# Patient Record
Sex: Female | Born: 1965
Health system: Southern US, Community
[De-identification: ages and names within clinical notes are randomized; demographics above are authoritative.]

## PROBLEM LIST (undated history)

## (undated) ENCOUNTER — Emergency Department (HOSPITAL_BASED_OUTPATIENT_CLINIC_OR_DEPARTMENT_OTHER): Admission: EM | Payer: Medicare Other | Source: Home / Self Care

## (undated) DIAGNOSIS — G629 Polyneuropathy, unspecified: Secondary | ICD-10-CM

## (undated) DIAGNOSIS — M545 Low back pain, unspecified: Secondary | ICD-10-CM

## (undated) DIAGNOSIS — F329 Major depressive disorder, single episode, unspecified: Secondary | ICD-10-CM

## (undated) DIAGNOSIS — H9190 Unspecified hearing loss, unspecified ear: Secondary | ICD-10-CM

## (undated) DIAGNOSIS — N159 Renal tubulo-interstitial disease, unspecified: Secondary | ICD-10-CM

## (undated) DIAGNOSIS — R519 Headache, unspecified: Secondary | ICD-10-CM

## (undated) DIAGNOSIS — R51 Headache: Principal | ICD-10-CM

## (undated) DIAGNOSIS — C50919 Malignant neoplasm of unspecified site of unspecified female breast: Secondary | ICD-10-CM

## (undated) DIAGNOSIS — N390 Urinary tract infection, site not specified: Secondary | ICD-10-CM

## (undated) DIAGNOSIS — T4145XA Adverse effect of unspecified anesthetic, initial encounter: Secondary | ICD-10-CM

## (undated) DIAGNOSIS — G459 Transient cerebral ischemic attack, unspecified: Secondary | ICD-10-CM

## (undated) DIAGNOSIS — M1990 Unspecified osteoarthritis, unspecified site: Secondary | ICD-10-CM

## (undated) DIAGNOSIS — H9311 Tinnitus, right ear: Secondary | ICD-10-CM

## (undated) DIAGNOSIS — G4730 Sleep apnea, unspecified: Secondary | ICD-10-CM

## (undated) DIAGNOSIS — F419 Anxiety disorder, unspecified: Secondary | ICD-10-CM

## (undated) DIAGNOSIS — A419 Sepsis, unspecified organism: Secondary | ICD-10-CM

## (undated) DIAGNOSIS — C801 Malignant (primary) neoplasm, unspecified: Secondary | ICD-10-CM

## (undated) DIAGNOSIS — E785 Hyperlipidemia, unspecified: Secondary | ICD-10-CM

## (undated) DIAGNOSIS — F32A Depression, unspecified: Secondary | ICD-10-CM

## (undated) HISTORY — DX: Malignant (primary) neoplasm, unspecified: C80.1

## (undated) HISTORY — DX: Hyperlipidemia, unspecified: E78.5

## (undated) HISTORY — DX: Sepsis, unspecified organism: A41.9

## (undated) HISTORY — PX: BREAST LUMPECTOMY: SHX2

## (undated) HISTORY — DX: Low back pain, unspecified: M54.50

## (undated) HISTORY — DX: Low back pain: M54.5

## (undated) HISTORY — DX: Tinnitus, right ear: H93.11

## (undated) HISTORY — DX: Depression, unspecified: F32.A

## (undated) HISTORY — DX: Transient cerebral ischemic attack, unspecified: G45.9

## (undated) HISTORY — DX: Unspecified hearing loss, unspecified ear: H91.90

## (undated) HISTORY — DX: Headache: R51

## (undated) HISTORY — DX: Headache, unspecified: R51.9

## (undated) HISTORY — DX: Polyneuropathy, unspecified: G62.9

## (undated) HISTORY — DX: Anxiety disorder, unspecified: F41.9

## (undated) HISTORY — DX: Urinary tract infection, site not specified: N39.0

## (undated) HISTORY — DX: Major depressive disorder, single episode, unspecified: F32.9

---

## 1997-08-19 ENCOUNTER — Other Ambulatory Visit: Admission: RE | Admit: 1997-08-19 | Discharge: 1997-08-19 | Payer: Self-pay | Admitting: Podiatry

## 1998-01-22 HISTORY — PX: FOOT SURGERY: SHX648

## 1998-04-12 ENCOUNTER — Other Ambulatory Visit: Admission: RE | Admit: 1998-04-12 | Discharge: 1998-04-12 | Payer: Self-pay | Admitting: Obstetrics and Gynecology

## 1999-05-21 ENCOUNTER — Emergency Department (HOSPITAL_COMMUNITY): Admission: EM | Admit: 1999-05-21 | Discharge: 1999-05-21 | Payer: Self-pay | Admitting: Emergency Medicine

## 1999-05-22 ENCOUNTER — Encounter: Admission: RE | Admit: 1999-05-22 | Discharge: 1999-05-22 | Payer: Self-pay | Admitting: *Deleted

## 1999-10-27 ENCOUNTER — Other Ambulatory Visit: Admission: RE | Admit: 1999-10-27 | Discharge: 1999-10-27 | Payer: Self-pay | Admitting: Obstetrics and Gynecology

## 2000-01-31 ENCOUNTER — Inpatient Hospital Stay (HOSPITAL_COMMUNITY): Admission: EM | Admit: 2000-01-31 | Discharge: 2000-02-02 | Payer: Self-pay | Admitting: *Deleted

## 2000-02-04 ENCOUNTER — Inpatient Hospital Stay: Admission: EM | Admit: 2000-02-04 | Discharge: 2000-02-08 | Payer: Self-pay | Admitting: *Deleted

## 2000-10-30 ENCOUNTER — Other Ambulatory Visit: Admission: RE | Admit: 2000-10-30 | Discharge: 2000-10-30 | Payer: Self-pay | Admitting: Obstetrics and Gynecology

## 2000-11-04 ENCOUNTER — Emergency Department (HOSPITAL_COMMUNITY): Admission: EM | Admit: 2000-11-04 | Discharge: 2000-11-04 | Payer: Self-pay

## 2002-10-08 ENCOUNTER — Emergency Department (HOSPITAL_COMMUNITY): Admission: EM | Admit: 2002-10-08 | Discharge: 2002-10-08 | Payer: Self-pay | Admitting: Emergency Medicine

## 2002-10-08 ENCOUNTER — Encounter: Payer: Self-pay | Admitting: Emergency Medicine

## 2002-11-04 ENCOUNTER — Encounter: Admission: RE | Admit: 2002-11-04 | Discharge: 2002-11-17 | Payer: Self-pay | Admitting: Neurosurgery

## 2003-01-06 ENCOUNTER — Other Ambulatory Visit: Admission: RE | Admit: 2003-01-06 | Discharge: 2003-01-06 | Payer: Self-pay | Admitting: Obstetrics and Gynecology

## 2003-01-20 ENCOUNTER — Encounter: Admission: RE | Admit: 2003-01-20 | Discharge: 2003-01-20 | Payer: Self-pay | Admitting: Obstetrics and Gynecology

## 2003-11-24 ENCOUNTER — Ambulatory Visit: Payer: Self-pay | Admitting: Family Medicine

## 2003-11-30 ENCOUNTER — Encounter: Admission: RE | Admit: 2003-11-30 | Discharge: 2003-12-10 | Payer: Self-pay | Admitting: Neurosurgery

## 2003-12-07 ENCOUNTER — Encounter: Admission: RE | Admit: 2003-12-07 | Discharge: 2003-12-07 | Payer: Self-pay | Admitting: Obstetrics & Gynecology

## 2004-03-08 ENCOUNTER — Ambulatory Visit: Payer: Self-pay | Admitting: Family Medicine

## 2004-03-15 ENCOUNTER — Ambulatory Visit: Payer: Self-pay | Admitting: Family Medicine

## 2004-03-29 ENCOUNTER — Other Ambulatory Visit: Admission: RE | Admit: 2004-03-29 | Discharge: 2004-03-29 | Payer: Self-pay | Admitting: Obstetrics and Gynecology

## 2004-04-27 ENCOUNTER — Ambulatory Visit: Payer: Self-pay | Admitting: Internal Medicine

## 2004-08-22 HISTORY — PX: ABDOMINAL HYSTERECTOMY: SHX81

## 2004-09-18 ENCOUNTER — Observation Stay (HOSPITAL_COMMUNITY): Admission: RE | Admit: 2004-09-18 | Discharge: 2004-09-19 | Payer: Self-pay | Admitting: Obstetrics and Gynecology

## 2004-09-18 ENCOUNTER — Encounter (INDEPENDENT_AMBULATORY_CARE_PROVIDER_SITE_OTHER): Payer: Self-pay | Admitting: *Deleted

## 2005-02-02 ENCOUNTER — Ambulatory Visit: Payer: Self-pay | Admitting: Family Medicine

## 2005-02-06 ENCOUNTER — Ambulatory Visit: Payer: Self-pay | Admitting: Family Medicine

## 2005-05-15 ENCOUNTER — Ambulatory Visit: Payer: Self-pay | Admitting: Family Medicine

## 2005-08-08 ENCOUNTER — Ambulatory Visit: Payer: Self-pay | Admitting: Family Medicine

## 2005-12-10 ENCOUNTER — Inpatient Hospital Stay (HOSPITAL_COMMUNITY): Admission: EM | Admit: 2005-12-10 | Discharge: 2005-12-11 | Payer: Self-pay | Admitting: Emergency Medicine

## 2005-12-10 ENCOUNTER — Ambulatory Visit: Payer: Self-pay | Admitting: Internal Medicine

## 2005-12-17 ENCOUNTER — Ambulatory Visit: Payer: Self-pay | Admitting: Family Medicine

## 2006-01-07 ENCOUNTER — Encounter: Admission: RE | Admit: 2006-01-07 | Discharge: 2006-04-07 | Payer: Self-pay | Admitting: Internal Medicine

## 2006-03-07 ENCOUNTER — Ambulatory Visit: Payer: Self-pay | Admitting: Family Medicine

## 2006-10-01 ENCOUNTER — Encounter: Admission: RE | Admit: 2006-10-01 | Discharge: 2006-10-01 | Payer: Self-pay | Admitting: Obstetrics and Gynecology

## 2006-11-05 ENCOUNTER — Telehealth (INDEPENDENT_AMBULATORY_CARE_PROVIDER_SITE_OTHER): Payer: Self-pay | Admitting: *Deleted

## 2006-11-26 ENCOUNTER — Ambulatory Visit: Payer: Self-pay | Admitting: Family Medicine

## 2006-11-26 DIAGNOSIS — F329 Major depressive disorder, single episode, unspecified: Secondary | ICD-10-CM

## 2006-11-26 DIAGNOSIS — J209 Acute bronchitis, unspecified: Secondary | ICD-10-CM

## 2006-11-26 DIAGNOSIS — E1169 Type 2 diabetes mellitus with other specified complication: Secondary | ICD-10-CM | POA: Insufficient documentation

## 2006-11-26 DIAGNOSIS — E119 Type 2 diabetes mellitus without complications: Secondary | ICD-10-CM

## 2006-11-26 DIAGNOSIS — E785 Hyperlipidemia, unspecified: Secondary | ICD-10-CM

## 2006-11-26 DIAGNOSIS — F411 Generalized anxiety disorder: Secondary | ICD-10-CM | POA: Insufficient documentation

## 2006-11-26 DIAGNOSIS — F32A Depression, unspecified: Secondary | ICD-10-CM | POA: Insufficient documentation

## 2006-11-27 ENCOUNTER — Ambulatory Visit: Payer: Self-pay | Admitting: Family Medicine

## 2006-11-27 LAB — CONVERTED CEMR LAB
Bilirubin Urine: NEGATIVE
Blood in Urine, dipstick: NEGATIVE
pH: 5

## 2006-11-28 ENCOUNTER — Telehealth: Payer: Self-pay | Admitting: Family Medicine

## 2006-11-28 LAB — CONVERTED CEMR LAB
AST: 39 units/L — ABNORMAL HIGH (ref 0–37)
Albumin: 4 g/dL (ref 3.5–5.2)
Alkaline Phosphatase: 101 units/L (ref 39–117)
BUN: 10 mg/dL (ref 6–23)
Basophils Absolute: 0 10*3/uL (ref 0.0–0.1)
Chloride: 104 meq/L (ref 96–112)
Cholesterol: 249 mg/dL (ref 0–200)
Creatinine, Ser: 0.8 mg/dL (ref 0.4–1.2)
Direct LDL: 189.7 mg/dL
GFR calc non Af Amer: 84 mL/min
HDL: 33.3 mg/dL — ABNORMAL LOW (ref >39.0)
Hgb A1c MFr Bld: 10.9 % — ABNORMAL HIGH (ref 4.6–6.0)
MCHC: 34.2 g/dL (ref 30.0–36.0)
Microalb, Ur: 5.6 mg/dL — ABNORMAL HIGH (ref 0.0–1.9)
Monocytes Relative: 6.9 % (ref 3.0–11.0)
Platelets: 234 10*3/uL (ref 150–400)
RBC: 5.26 M/uL — ABNORMAL HIGH (ref 3.87–5.11)
RDW: 14.2 % (ref 11.5–14.6)
TSH: 4.05 microintl units/mL (ref 0.35–5.50)
Total Bilirubin: 0.8 mg/dL (ref 0.3–1.2)
Total CHOL/HDL Ratio: 7.5
Triglycerides: 200 mg/dL — ABNORMAL HIGH (ref 0–149)

## 2006-12-04 ENCOUNTER — Ambulatory Visit: Payer: Self-pay | Admitting: Family Medicine

## 2006-12-08 ENCOUNTER — Observation Stay (HOSPITAL_COMMUNITY): Admission: EM | Admit: 2006-12-08 | Discharge: 2006-12-10 | Payer: Self-pay | Admitting: Emergency Medicine

## 2006-12-08 ENCOUNTER — Encounter (INDEPENDENT_AMBULATORY_CARE_PROVIDER_SITE_OTHER): Payer: Self-pay | Admitting: Neurology

## 2006-12-09 ENCOUNTER — Telehealth: Payer: Self-pay | Admitting: Family Medicine

## 2006-12-09 ENCOUNTER — Encounter (INDEPENDENT_AMBULATORY_CARE_PROVIDER_SITE_OTHER): Payer: Self-pay | Admitting: Neurology

## 2006-12-10 ENCOUNTER — Encounter: Payer: Self-pay | Admitting: Family Medicine

## 2006-12-11 ENCOUNTER — Encounter: Payer: Self-pay | Admitting: Family Medicine

## 2007-04-05 ENCOUNTER — Encounter: Payer: Self-pay | Admitting: Family Medicine

## 2007-04-14 ENCOUNTER — Ambulatory Visit: Payer: Self-pay | Admitting: Family Medicine

## 2007-05-06 ENCOUNTER — Encounter: Payer: Self-pay | Admitting: Family Medicine

## 2007-05-19 ENCOUNTER — Telehealth: Payer: Self-pay | Admitting: Family Medicine

## 2007-05-22 ENCOUNTER — Ambulatory Visit: Payer: Self-pay | Admitting: Family Medicine

## 2007-05-28 ENCOUNTER — Emergency Department (HOSPITAL_COMMUNITY): Admission: EM | Admit: 2007-05-28 | Discharge: 2007-05-28 | Payer: Self-pay | Admitting: Emergency Medicine

## 2007-05-29 ENCOUNTER — Telehealth: Payer: Self-pay | Admitting: Family Medicine

## 2007-06-03 ENCOUNTER — Ambulatory Visit: Payer: Self-pay | Admitting: Family Medicine

## 2007-06-03 DIAGNOSIS — S335XXA Sprain of ligaments of lumbar spine, initial encounter: Secondary | ICD-10-CM | POA: Insufficient documentation

## 2007-06-09 ENCOUNTER — Telehealth: Payer: Self-pay | Admitting: Family Medicine

## 2007-07-04 ENCOUNTER — Encounter: Payer: Self-pay | Admitting: Family Medicine

## 2007-08-28 ENCOUNTER — Ambulatory Visit: Payer: Self-pay | Admitting: Family Medicine

## 2007-08-28 DIAGNOSIS — B379 Candidiasis, unspecified: Secondary | ICD-10-CM

## 2007-11-12 ENCOUNTER — Encounter: Admission: RE | Admit: 2007-11-12 | Discharge: 2007-11-12 | Payer: Self-pay | Admitting: Obstetrics and Gynecology

## 2007-11-23 HISTORY — PX: BREAST SURGERY: SHX581

## 2007-11-28 ENCOUNTER — Encounter: Admission: RE | Admit: 2007-11-28 | Discharge: 2007-11-28 | Payer: Self-pay | Admitting: Obstetrics and Gynecology

## 2007-12-15 ENCOUNTER — Encounter (INDEPENDENT_AMBULATORY_CARE_PROVIDER_SITE_OTHER): Payer: Self-pay | Admitting: General Surgery

## 2007-12-15 ENCOUNTER — Ambulatory Visit (HOSPITAL_COMMUNITY): Admission: RE | Admit: 2007-12-15 | Discharge: 2007-12-15 | Payer: Self-pay | Admitting: General Surgery

## 2007-12-22 ENCOUNTER — Ambulatory Visit: Payer: Self-pay | Admitting: Oncology

## 2007-12-28 ENCOUNTER — Encounter: Admission: RE | Admit: 2007-12-28 | Discharge: 2007-12-28 | Payer: Self-pay | Admitting: General Surgery

## 2007-12-31 ENCOUNTER — Ambulatory Visit: Admission: RE | Admit: 2007-12-31 | Discharge: 2008-01-20 | Payer: Self-pay | Admitting: Radiation Oncology

## 2007-12-31 ENCOUNTER — Encounter: Payer: Self-pay | Admitting: Family Medicine

## 2008-01-12 ENCOUNTER — Ambulatory Visit: Payer: Self-pay | Admitting: Genetic Counselor

## 2008-01-22 ENCOUNTER — Encounter: Payer: Self-pay | Admitting: Family Medicine

## 2008-02-11 ENCOUNTER — Encounter: Payer: Self-pay | Admitting: Family Medicine

## 2008-02-20 ENCOUNTER — Ambulatory Visit: Payer: Self-pay | Admitting: Family Medicine

## 2008-02-20 DIAGNOSIS — J45909 Unspecified asthma, uncomplicated: Secondary | ICD-10-CM | POA: Insufficient documentation

## 2008-04-12 ENCOUNTER — Ambulatory Visit: Payer: Self-pay | Admitting: Oncology

## 2008-04-14 ENCOUNTER — Encounter: Payer: Self-pay | Admitting: Family Medicine

## 2008-05-31 ENCOUNTER — Telehealth: Payer: Self-pay | Admitting: Family Medicine

## 2008-06-01 HISTORY — PX: NERVE GRAFT: SHX721

## 2008-08-16 ENCOUNTER — Ambulatory Visit: Payer: Self-pay | Admitting: Family Medicine

## 2008-08-16 DIAGNOSIS — N390 Urinary tract infection, site not specified: Secondary | ICD-10-CM

## 2008-08-16 LAB — CONVERTED CEMR LAB
Bilirubin Urine: NEGATIVE
Ketones, urine, test strip: NEGATIVE
Nitrite: POSITIVE
Specific Gravity, Urine: >=1.03

## 2008-10-12 ENCOUNTER — Emergency Department (HOSPITAL_COMMUNITY): Admission: EM | Admit: 2008-10-12 | Discharge: 2008-10-12 | Payer: Self-pay | Admitting: Emergency Medicine

## 2008-11-29 ENCOUNTER — Encounter: Admission: RE | Admit: 2008-11-29 | Discharge: 2008-11-29 | Payer: Self-pay | Admitting: General Surgery

## 2009-01-03 ENCOUNTER — Encounter: Admission: RE | Admit: 2009-01-03 | Discharge: 2009-01-03 | Payer: Self-pay | Admitting: General Surgery

## 2009-01-28 ENCOUNTER — Ambulatory Visit: Payer: Self-pay | Admitting: Oncology

## 2009-02-01 ENCOUNTER — Encounter: Payer: Self-pay | Admitting: Family Medicine

## 2009-04-14 ENCOUNTER — Ambulatory Visit: Payer: Self-pay | Admitting: Family Medicine

## 2009-04-15 LAB — CONVERTED CEMR LAB
ALT: 25 units/L (ref 0–35)
AST: 28 units/L (ref 0–37)
Albumin: 3.9 g/dL (ref 3.5–5.2)
Alkaline Phosphatase: 113 units/L (ref 39–117)
BUN: 13 mg/dL (ref 6–23)
Basophils Absolute: 0 10*3/uL (ref 0.0–0.1)
Basophils Relative: 0.3 % (ref 0.0–3.0)
Bilirubin, Direct: 0.1 mg/dL (ref 0.0–0.3)
CO2: 29 meq/L (ref 19–32)
Calcium: 9.5 mg/dL (ref 8.4–10.5)
Chloride: 100 meq/L (ref 96–112)
Creatinine, Ser: 1 mg/dL (ref 0.4–1.2)
Creatinine,U: 99 mg/dL
Eosinophils Absolute: 0.2 10*3/uL (ref 0.0–0.7)
Eosinophils Relative: 2.3 % (ref 0.0–5.0)
GFR calc non Af Amer: 63.91 mL/min (ref >60)
Glucose, Bld: 200 mg/dL — ABNORMAL HIGH (ref 70–99)
HCT: 40.6 % (ref 36.0–46.0)
Hemoglobin: 13.3 g/dL (ref 12.0–15.0)
Hgb A1c MFr Bld: 8.8 % — ABNORMAL HIGH (ref 4.6–6.5)
Lymphocytes Relative: 26.1 % (ref 12.0–46.0)
Lymphs Abs: 1.8 10*3/uL (ref 0.7–4.0)
MCHC: 32.8 g/dL (ref 30.0–36.0)
MCV: 81.2 fL (ref 78.0–100.0)
Microalb Creat Ratio: 15.2 mg/g (ref 0.0–30.0)
Microalb, Ur: 1.5 mg/dL (ref 0.0–1.9)
Monocytes Absolute: 0.3 10*3/uL (ref 0.1–1.0)
Monocytes Relative: 4.8 % (ref 3.0–12.0)
Neutro Abs: 4.7 10*3/uL (ref 1.4–7.7)
Neutrophils Relative %: 66.5 % (ref 43.0–77.0)
Platelets: 195 10*3/uL (ref 150.0–400.0)
Potassium: 4.2 meq/L (ref 3.5–5.1)
Pro B Natriuretic peptide (BNP): 28 pg/mL (ref 0.0–100.0)
RBC: 5 M/uL (ref 3.87–5.11)
RDW: 15.2 % — ABNORMAL HIGH (ref 11.5–14.6)
Sodium: 141 meq/L (ref 135–145)
TSH: 4.04 microintl units/mL (ref 0.35–5.50)
Total Bilirubin: 0.5 mg/dL (ref 0.3–1.2)
Total Protein: 7.6 g/dL (ref 6.0–8.3)
WBC: 7 10*3/uL (ref 4.5–10.5)

## 2009-05-16 ENCOUNTER — Telehealth: Payer: Self-pay | Admitting: Family Medicine

## 2009-08-22 DIAGNOSIS — N390 Urinary tract infection, site not specified: Secondary | ICD-10-CM

## 2009-08-22 DIAGNOSIS — A419 Sepsis, unspecified organism: Secondary | ICD-10-CM

## 2009-08-22 HISTORY — DX: Sepsis, unspecified organism: A41.9

## 2009-08-22 HISTORY — DX: Urinary tract infection, site not specified: N39.0

## 2009-09-07 ENCOUNTER — Emergency Department (HOSPITAL_COMMUNITY): Admission: EM | Admit: 2009-09-07 | Discharge: 2009-09-08 | Payer: Self-pay | Admitting: Emergency Medicine

## 2009-09-09 ENCOUNTER — Inpatient Hospital Stay (HOSPITAL_COMMUNITY): Admission: EM | Admit: 2009-09-09 | Discharge: 2009-09-12 | Payer: Self-pay | Admitting: Emergency Medicine

## 2009-09-09 ENCOUNTER — Ambulatory Visit: Payer: Self-pay | Admitting: Family Medicine

## 2009-09-09 DIAGNOSIS — N12 Tubulo-interstitial nephritis, not specified as acute or chronic: Secondary | ICD-10-CM

## 2009-09-16 ENCOUNTER — Ambulatory Visit: Payer: Self-pay | Admitting: Family Medicine

## 2009-09-16 LAB — CONVERTED CEMR LAB
Blood in Urine, dipstick: NEGATIVE
Glucose, Urine, Semiquant: >=1000
Ketones, urine, test strip: NEGATIVE
WBC Urine, dipstick: NEGATIVE
pH: 5

## 2009-09-20 ENCOUNTER — Telehealth: Payer: Self-pay | Admitting: Family Medicine

## 2009-09-21 ENCOUNTER — Telehealth: Payer: Self-pay | Admitting: Family Medicine

## 2009-09-28 ENCOUNTER — Ambulatory Visit: Payer: Self-pay | Admitting: Family Medicine

## 2009-09-28 LAB — CONVERTED CEMR LAB
Blood in Urine, dipstick: NEGATIVE
Glucose, Urine, Semiquant: 250
Ketones, urine, test strip: NEGATIVE
Urobilinogen, UA: 0.2
WBC Urine, dipstick: NEGATIVE

## 2009-09-29 ENCOUNTER — Encounter: Payer: Self-pay | Admitting: Family Medicine

## 2009-10-02 ENCOUNTER — Observation Stay (HOSPITAL_COMMUNITY): Admission: EM | Admit: 2009-10-02 | Discharge: 2009-10-03 | Payer: Self-pay | Admitting: Emergency Medicine

## 2009-10-03 ENCOUNTER — Encounter (INDEPENDENT_AMBULATORY_CARE_PROVIDER_SITE_OTHER): Payer: Self-pay | Admitting: Internal Medicine

## 2009-10-13 ENCOUNTER — Ambulatory Visit: Payer: Self-pay | Admitting: Family Medicine

## 2009-10-13 DIAGNOSIS — M545 Low back pain, unspecified: Secondary | ICD-10-CM | POA: Insufficient documentation

## 2009-10-13 DIAGNOSIS — G459 Transient cerebral ischemic attack, unspecified: Secondary | ICD-10-CM

## 2009-10-21 ENCOUNTER — Ambulatory Visit: Payer: Self-pay | Admitting: Family Medicine

## 2009-10-21 ENCOUNTER — Encounter: Admission: RE | Admit: 2009-10-21 | Discharge: 2009-10-21 | Payer: Self-pay | Admitting: Family Medicine

## 2009-10-27 ENCOUNTER — Telehealth: Payer: Self-pay | Admitting: Family Medicine

## 2009-10-27 ENCOUNTER — Emergency Department (HOSPITAL_COMMUNITY): Admission: EM | Admit: 2009-10-27 | Discharge: 2009-10-27 | Payer: Self-pay | Admitting: Emergency Medicine

## 2009-11-04 ENCOUNTER — Telehealth: Payer: Self-pay | Admitting: Family Medicine

## 2009-11-04 ENCOUNTER — Emergency Department (HOSPITAL_COMMUNITY)
Admission: EM | Admit: 2009-11-04 | Discharge: 2009-11-04 | Disposition: A | Discharge status: Other Acute Inpatient Hospital | Payer: Self-pay | Source: Home / Self Care | Admitting: Family Medicine

## 2009-11-05 ENCOUNTER — Inpatient Hospital Stay (HOSPITAL_COMMUNITY): Admission: EM | Admit: 2009-11-05 | Discharge: 2009-11-07 | Payer: Self-pay | Admitting: Emergency Medicine

## 2010-01-04 ENCOUNTER — Encounter: Payer: Self-pay | Admitting: Family Medicine

## 2010-01-05 ENCOUNTER — Encounter
Admission: RE | Admit: 2010-01-05 | Discharge: 2010-01-05 | Payer: Self-pay | Source: Home / Self Care | Attending: Obstetrics and Gynecology | Admitting: Obstetrics and Gynecology

## 2010-01-27 ENCOUNTER — Ambulatory Visit: Payer: Self-pay | Admitting: Oncology

## 2010-02-12 ENCOUNTER — Encounter: Payer: Self-pay | Admitting: Gastroenterology

## 2010-02-23 NOTE — Assessment & Plan Note (Signed)
Summary: low back pain/dm   Vital Signs:  Patient profile:   45 year old female O2 Sat:      95 % Temp:     98.1 degrees F Pulse rate:   90 / minute BP sitting:   130 / 76  (left arm) Cuff size:   large  Vitals Entered By: Pura Spice, RN (October 13, 2009 10:52 AM) CC: left thigh numbness low back pain   History of Present Illness: Here to follow up a recent hospital stay from 10-02-09 to 10-03-09 and for severe low back pains. When she was recently admitted she was experiencing numbness and tinglin in the right face, right arm, and right leg. No weakness or pain. No hx of trauma. Her head CT was clear. She was felt to have had a TIA, and this would be the third one she has had in the past 4 years. Since coming home she still has slight tingling in the right face, but the right arm and leg are back to normal. She was set to have an outpatient brain MRI but this has been postponed several times due to insurance reasons. Also, she has had steadily worsening sharp severe pains in the left lower back that radiate down the back of the left leg. No numbness but the leg is weak, and she feels like she might fall when walking on it. Using Motrin.   Allergies (verified): No Known Drug Allergies  Past History:  Past Medical History: Anxiety Depression Diabetes mellitus, type II (sees Dr. Talmage Nap) Hyperlipidemia peripheral neuropathy TIA's 2007 and 2008 and 2011  Asthma breast cancer, sees Dr. Susanne Greenhouse urosepsis with Adline Mango 08-2009   Past Surgical History: Reviewed history from 04/14/2009 and no changes required. Hysterectomy Plantar fasciitis in lt foot surgery cadaver nerve graft to right inferior alveolar nerve 06-01-08 per Dr. Mcarthur Rossetti at [facility] [place] [facility] after this was               damaged during a root canal excisional biopsy of left ductal carcinoma in situ 11-2007  Review of Systems  The patient denies anorexia, fever, weight loss, weight gain, vision loss, decreased  hearing, hoarseness, chest pain, syncope, dyspnea on exertion, peripheral edema, prolonged cough, headaches, hemoptysis, abdominal pain, melena, hematochezia, severe indigestion/heartburn, hematuria, incontinence, genital sores, muscle weakness, suspicious skin lesions, transient blindness, difficulty walking, depression, unusual weight change, abnormal bleeding, enlarged lymph nodes, angioedema, breast masses, and testicular masses.    Physical Exam  General:  alert, in pain. It is hard for her to get up out of a chair due to pain Neck:  No deformities, masses, or tenderness noted. Lungs:  Normal respiratory effort, chest expands symmetrically. Lungs are clear to auscultation, no crackles or wheezes. Heart:  Normal rate and regular rhythm. S1 and S2 normal without gallop, murmur, click, rub or other extra sounds. Msk:  very tender in the left lower back and over the left sciatic notch. Her ROM is reduced and SLR on the left is positive Neurologic:  alert & oriented X3, cranial nerves II-XII intact, and strength normal in all extremities.  She has a limp    Impression & Recommendations:  Problem # 1:  TIA (ICD-435.9)  Her updated medication list for this problem includes:    Aspirin 325 Mg Tabs (Aspirin) .Marland Kitchen... 1 once daily  Problem # 2:  BACK PAIN, LUMBAR (ICD-724.2)  Her updated medication list for this problem includes:    Aspirin 325 Mg Tabs (Aspirin) .Marland KitchenMarland KitchenMarland KitchenMarland Kitchen 1  once daily    Ibuprofen 800 Mg Tabs (Ibuprofen) .Marland Kitchen... 1 every 6 hours as needed pain  Orders: Radiology Referral (Radiology)  Complete Medication List: 1)  Simvastatin 40 Mg Tabs (Simvastatin) .Marland Kitchen.. 1 by mouth once daily 2)  Glucophage 1000 Mg Tabs (Metformin hcl) .Marland Kitchen.. 1 once daily 3)  Sertraline Hcl 50 Mg Tabs (Sertraline hcl) .Marland Kitchen.. 1 by mouth once daily 4)  Aspirin 325 Mg Tabs (Aspirin) .Marland Kitchen.. 1 once daily 5)  Zocor 40 Mg Tabs (Simvastatin) .Marland Kitchen.. 1 by mouth once daily 6)  Proair Hfa 108 (90 Base) Mcg/act Aers (Albuterol  sulfate) .... 2 puffs every 4 hours as needed sob 7)  Ibuprofen 800 Mg Tabs (Ibuprofen) .Marland Kitchen.. 1 every 6 hours as needed pain 8)  Ketoconazole 2 % Crea (Ketoconazole) .... Apply three times a day as needed 9)  Furosemide 40 Mg Tabs (Furosemide) .... 2 tabs each am 10)  Januvia 100 Mg Tabs (Sitagliptin phosphate) .... Not taking at this time 11)  Humalog 100 Unit/ml Soln (Insulin lispro (human)) .... 75 units three times a day 12)  Humulin N 100 Unit/ml Susp (Insulin isophane human) .... 75 units three times a day 13)  Diflucan 150 Mg Tabs (Fluconazole) .Marland Kitchen.. 1 tablet 14)  Bactrim Ds 800-160 Mg Tabs (Sulfamethoxazole-trimethoprim) .... Two times a day  Patient Instructions: 1)  We will try to get approval for a contrasted brain MRI soon for the TIAs, as well as MRI of the lumbar spine. rest , use Motrin as needed . Of note, her creatinine on 10-03-09 was normal at 0.96.

## 2010-02-23 NOTE — Assessment & Plan Note (Signed)
Summary: GENERAL MAILAISE, FATIGUE // RS   Vital Signs:  Patient profile:   45 year old female O2 Sat:      97 % Temp:     98.8 degrees F Pulse rate:   93 / minute BP sitting:   130 / 82  (left arm) Cuff size:   large  Vitals Entered By: Pura Spice, RN (September 28, 2009 11:19 AM) CC: states feels bad today c/o low back and hip pain. discharged from hosp 1 cwk ago. Not taking Januvia . FBS ths am per pt 172 Is Patient Diabetic? Yes Did you bring your meter with you today? No   History of Present Illness: Here to follow up on generalized fatigue. She was hospitalized from 09-09-09 to 09-12-09 for  Klebsiella urosepsis, and finished out a 7 day course of Cipro after that. She felt better for a week or so, but now for the past several days she feels much worse and it reminds her of how she felt before she went into the hospital. She is very weak and feels SOB. No nausea or fever, no cough or chest pains, no abdominal pains.   Allergies (verified): No Known Drug Allergies  Past History:  Past Medical History: Anxiety Depression Diabetes mellitus, type II (sees Dr. Talmage Nap) Hyperlipidemia peripheral neuropathy TIA's 2007 and 2008 Asthma breast cancer, sees Dr. Susanne Greenhouse urosepsis with Adline Mango 08-2009   Past Surgical History: Reviewed history from 04/14/2009 and no changes required. Hysterectomy Plantar fasciitis in lt foot surgery cadaver nerve graft to right inferior alveolar nerve 06-01-08 per Dr. Mcarthur Rossetti at [place] [place] [facility] after this was               damaged during a root canal excisional biopsy of left ductal carcinoma in situ 11-2007  Review of Systems  The patient denies anorexia, fever, weight loss, weight gain, vision loss, decreased hearing, hoarseness, chest pain, syncope, dyspnea on exertion, peripheral edema, prolonged cough, headaches, hemoptysis, abdominal pain, melena, hematochezia, severe indigestion/heartburn, hematuria, incontinence, genital sores,  muscle weakness, suspicious skin lesions, transient blindness, difficulty walking, depression, unusual weight change, abnormal bleeding, enlarged lymph nodes, angioedema, breast masses, and testicular masses.    Physical Exam  General:  she appears ill, good skin color, alert Neck:  No deformities, masses, or tenderness noted. Lungs:  Normal respiratory effort, chest expands symmetrically. Lungs are clear to auscultation, no crackles or wheezes. Heart:  Normal rate and regular rhythm. S1 and S2 normal without gallop, murmur, click, rub or other extra sounds. Abdomen:  Bowel sounds positive,abdomen soft and non-tender without masses, organomegaly or hernias noted.   Impression & Recommendations:  Problem # 1:  PYELONEPHRITIS (ICD-590.80)  Her updated medication list for this problem includes:    Bactrim Ds 800-160 Mg Tabs (Sulfamethoxazole-trimethoprim) .Marland Kitchen..Marland Kitchen Two times a day  Orders: UA Dipstick w/o Micro (manual) (16109) T-Culture, Urine (60454-09811)  Complete Medication List: 1)  Simvastatin 40 Mg Tabs (Simvastatin) .Marland Kitchen.. 1 by mouth once daily 2)  Glucophage 1000 Mg Tabs (Metformin hcl) .Marland Kitchen.. 1 once daily 3)  Sertraline Hcl 50 Mg Tabs (Sertraline hcl) .Marland Kitchen.. 1 by mouth once daily 4)  Aspirin 325 Mg Tabs (Aspirin) .Marland Kitchen.. 1 once daily 5)  Zocor 40 Mg Tabs (Simvastatin) .Marland Kitchen.. 1 by mouth once daily 6)  Proair Hfa 108 (90 Base) Mcg/act Aers (Albuterol sulfate) .... 2 puffs every 4 hours as needed sob 7)  Ibuprofen 800 Mg Tabs (Ibuprofen) .Marland Kitchen.. 1 every 6 hours as needed pain 8)  Ketoconazole 2 % Crea (Ketoconazole) .... Apply three times a day as needed 9)  Furosemide 40 Mg Tabs (Furosemide) .... Two times a day 10)  Januvia 100 Mg Tabs (Sitagliptin phosphate) .... Not taking at this time 11)  Humalog 100 Unit/ml Soln (Insulin lispro (human)) .... 75 units three times a day 12)  Humulin N 100 Unit/ml Susp (Insulin isophane human) .... 75 units three times a day 13)  Diflucan 150 Mg Tabs  (Fluconazole) .Marland Kitchen.. 1 tablet 14)  Bactrim Ds 800-160 Mg Tabs (Sulfamethoxazole-trimethoprim) .... Two times a day  Other Orders: Rocephin  250mg  (Z6109) Admin of Therapeutic Inj  intramuscular or subcutaneous (60454)  Patient Instructions: 1)  I think it is likely that she still has some infection in her system, so we will culture today's urine sample. Given a Rocephin shot. Start on Bactrim DS and plan on taking this for a full 30 days. Drink fluids. We wrote for her to be out of work today until 10-03-09.  Prescriptions: BACTRIM DS 800-160 MG TABS (SULFAMETHOXAZOLE-TRIMETHOPRIM) two times a day  #60 x 0   Entered and Authorized by:   Nelwyn Salisbury MD   Signed by:   Nelwyn Salisbury MD on 09/28/2009   Method used:   Electronically to        [facility]  [address] [phone]* (retail)       [address]       [place], [place]  [id number]       Ph: [phone] or [phone]       Fax: [phone]   RxID:   [id number]   Laboratory Results   Urine Tests  Date/Time Received: September 28, 2009  Date/Time Reported: 12:14 PM   Routine Urinalysis   Color: yellow Appearance: Clear Glucose: 250   (Normal Range: Negative) Bilirubin: negative   (Normal Range: Negative) Ketone: negative   (Normal Range: Negative) Spec. Gravity: 1.020   (Normal Range: 1.003-1.035) Blood: negative   (Normal Range: Negative) pH: 5.0   (Normal Range: 5.0-8.0) Protein: negative   (Normal Range: Negative) Urobilinogen: 0.2   (Normal Range: 0-1) Nitrite: negative   (Normal Range: Negative) Leukocyte Esterace: negative   (Normal Range: Negative)         Medication Administration  Injection # 1:    Medication: Rocephin  250mg     Diagnosis: UROSEPSIS (ICD-599.0)    Route: IM    Site: RUOQ gluteus    Exp Date: 05/2012    Lot #: [id number]    Mfr: [email or web address]    Comments: 1 gram given    Patient tolerated injection without complications    Given by: Pura Spice, RN  (September 28, 2009 12:18 PM)  Orders Added: 1)  Est. Patient Level IV [43329] 2)  UA Dipstick w/o Micro (manual) [81002] 3)  T-Culture, Urine [[id number]] 4)  Rocephin  250mg  [J0696] 5)  Admin of Therapeutic Inj  intramuscular or subcutaneous [30160]

## 2010-02-23 NOTE — Letter (Signed)
Summary: [facility]   Imported By: Maryln Gottron 10/14/2009 14:55:35  _____________________________________________________________________  External Attachment:    Type:   Image     Comment:   External Document

## 2010-02-23 NOTE — Progress Notes (Signed)
Summary: sinus complaints  Phone Note Call from Patient   Caller: Patient Call For: Nelwyn Salisbury MD Summary of Call: Fever 101, sinus congestion, sore throat and chest tightness with dry cough.  Would like and antibioitc as she cannot leave work. [phone] Initial call taken by: Lynann Beaver CMA,  May 16, 2009 9:30 AM  Follow-up for Phone Call        call in a [employer] Follow-up by: Nelwyn Salisbury MD,  May 16, 2009 4:58 PM  Additional Follow-up for Phone Call Additional follow up Details #1::        Rx Called In Additional Follow-up by: Raechel Ache, RN,  May 16, 2009 5:02 PM

## 2010-02-23 NOTE — Progress Notes (Signed)
Summary: ?urosepsis  Phone Note Call from Patient   Caller: Daughter Summary of Call: pt.'s duaghter called, needed to speak with Dr. Clent Ridges right away about an important matter concerning her mother. Say her mother has been in and out of the hospital with Urosepsis and they need to ask him a question. Initial call taken by: Georgian Co,  November 04, 2009 4:39 PM  Follow-up for Phone Call        duplicate call Follow-up by: Nelwyn Salisbury MD,  November 04, 2009 4:52 PM

## 2010-02-23 NOTE — Progress Notes (Signed)
Summary: Advised ER  Phone Note Call from Patient   Caller: Patient Call For: Nelwyn Salisbury MD Summary of Call: Advised pt to go straight to the ER due to extreme SOB, and fatigue with worsening symptoms that she had when she was hospitalized for sepsis. Initial call taken by: Lynann Beaver CMA,  October 27, 2009 3:35 PM  Follow-up for Phone Call        agreed Follow-up by: Nelwyn Salisbury MD,  October 28, 2009 8:47 AM

## 2010-02-23 NOTE — Assessment & Plan Note (Signed)
Summary: fup mri---per dr Brewer Hitchman//ccm   Vital Signs:  Patient profile:   45 year old female O2 Sat:      97 % Temp:     98.9 degrees F Pulse rate:   100 / minute BP sitting:   136 / 92  (left arm) Cuff size:   small  Vitals Entered By: Pura Spice, RN (October 21, 2009 1:02 PM) CC: follow up test results   History of Present Illness: Here to follow up recent MRIs of the head and lumbar spine. The head MRI/MRA was clear. The lumbar scan showed significant disc diease at L4-5. She is still having severe pains which radiate down the left leg.   Allergies (verified): No Known Drug Allergies  Past History:  Past Medical History: Reviewed history from 10/13/2009 and no changes required. Anxiety Depression Diabetes mellitus, type II (sees Dr. Talmage Nap) Hyperlipidemia peripheral neuropathy TIA's 2007 and 2008 and 2011  Asthma breast cancer, sees Dr. Susanne Greenhouse urosepsis with Adline Mango 08-2009   Past Surgical History: Reviewed history from 04/14/2009 and no changes required. Hysterectomy Plantar fasciitis in lt foot surgery cadaver nerve graft to right inferior alveolar nerve 06-01-08 per Dr. Mcarthur Rossetti at [place] [place] [facility] after this was               damaged during a root canal excisional biopsy of left ductal carcinoma in situ 11-2007  Review of Systems  The patient denies anorexia, fever, weight loss, weight gain, vision loss, decreased hearing, hoarseness, chest pain, syncope, dyspnea on exertion, peripheral edema, prolonged cough, headaches, hemoptysis, abdominal pain, melena, hematochezia, severe indigestion/heartburn, hematuria, incontinence, genital sores, muscle weakness, suspicious skin lesions, transient blindness, difficulty walking, depression, unusual weight change, abnormal bleeding, enlarged lymph nodes, angioedema, breast masses, and testicular masses.    Physical Exam  General:  in pain Msk:  No deformity or scoliosis noted of thoracic or lumbar spine.     Neurologic:  No cranial nerve deficits noted. Station and gait are normal. Plantar reflexes are down-going bilaterally. DTRs are symmetrical throughout. Sensory, motor and coordinative functions appear intact.   Impression & Recommendations:  Problem # 1:  BACK PAIN, LUMBAR (ICD-724.2)  Her updated medication list for this problem includes:    Aspirin 325 Mg Tabs (Aspirin) .Marland Kitchen... 1 once daily    Ibuprofen 800 Mg Tabs (Ibuprofen) .Marland Kitchen... 1 every 6 hours as needed pain    Percocet 10-650 Mg Tabs (Oxycodone-acetaminophen) .Marland Kitchen... 1 q 6 hours as needed pain  Orders: Orthopedic Referral (Ortho)  Problem # 2:  TIA (ICD-435.9)  Her updated medication list for this problem includes:    Aspirin 325 Mg Tabs (Aspirin) .Marland Kitchen... 1 once daily  Complete Medication List: 1)  Simvastatin 40 Mg Tabs (Simvastatin) .Marland Kitchen.. 1 by mouth once daily 2)  Glucophage 1000 Mg Tabs (Metformin hcl) .Marland Kitchen.. 1 once daily 3)  Sertraline Hcl 50 Mg Tabs (Sertraline hcl) .Marland Kitchen.. 1 by mouth once daily 4)  Aspirin 325 Mg Tabs (Aspirin) .Marland Kitchen.. 1 once daily 5)  Zocor 40 Mg Tabs (Simvastatin) .Marland Kitchen.. 1 by mouth once daily 6)  Proair Hfa 108 (90 Base) Mcg/act Aers (Albuterol sulfate) .... 2 puffs every 4 hours as needed sob 7)  Ibuprofen 800 Mg Tabs (Ibuprofen) .Marland Kitchen.. 1 every 6 hours as needed pain 8)  Ketoconazole 2 % Crea (Ketoconazole) .... Apply three times a day as needed 9)  Furosemide 40 Mg Tabs (Furosemide) .... 2 tabs each am 10)  Januvia 100 Mg Tabs (Sitagliptin phosphate) .... Not taking  at this time 11)  Humalog 100 Unit/ml Soln (Insulin lispro (human)) .... 75 units three times a day 12)  Humulin N 100 Unit/ml Susp (Insulin isophane human) .... 75 units three times a day 13)  Diflucan 150 Mg Tabs (Fluconazole) .Marland Kitchen.. 1 tablet 14)  Bactrim Ds 800-160 Mg Tabs (Sulfamethoxazole-trimethoprim) .... Two times a day 15)  Percocet 10-650 Mg Tabs (Oxycodone-acetaminophen) .Marland Kitchen.. 1 q 6 hours as needed pain  Patient Instructions: 1)  will refer  to Dr. Sharolyn Douglas for her back  Prescriptions: PERCOCET 10-650 MG TABS (OXYCODONE-ACETAMINOPHEN) 1 q 6 hours as needed pain  #120 x 0   Entered and Authorized by:   Nelwyn Salisbury MD   Signed by:   Nelwyn Salisbury MD on 10/21/2009   Method used:   Print then Give to Patient   RxID:   [id number]

## 2010-02-23 NOTE — Assessment & Plan Note (Signed)
Summary: hosp follow up re: severe blood inf/cjr   Vital Signs:  Patient profile:   45 year old female Temp:     99.0 degrees F oral BP sitting:   112 / 80  (left arm) Cuff size:   regular  Vitals Entered By: Raechel Ache, RN (September 16, 2009 1:07 PM) CC: Hosp f/u, feels weak. BS's 200 at home.   History of Present Illness: Here to follow up a hospital stay from 09-09-09 to 09-12-09 for urosepsis and pyelonephritis from Klebsiella pneumoniae. This was grown from both urine and blood cultures, and it was shown to be sensitive to Ciprofloxacin among other things. She was sent home with a 7 day course of Cirpo which she has been taking. She improved quite a bit with more energy, the fever went away, she regained her appetite, and her abdominal pains resolved. She felt almost back to normal yesterday, but today she actually feels worse again. She is fatigued and has a slight fever again. No abdominal pain, no urinary symptoms, no nausea. Her glucoses have remained out of control since she went home, with readings from 150 to 250 most of the time.   Allergies: No Known Drug Allergies  Past History:  Past Medical History: Anxiety Depression Diabetes mellitus, type II (sees Dr. Talmage Nap) Hyperlipidemia peripheral neuropathy TIA's 2007 and 2008 Asthma breast cancer, sees Dr. Susanne Greenhouse  Past Surgical History: Reviewed history from 04/14/2009 and no changes required. Hysterectomy Plantar fasciitis in lt foot surgery cadaver nerve graft to right inferior alveolar nerve 06-01-08 per Dr. Mcarthur Rossetti at [place]. [facility] [place] [facility] after this was               damaged during a root canal excisional biopsy of left ductal carcinoma in situ 11-2007  Review of Systems  The patient denies anorexia, fever, weight loss, weight gain, vision loss, decreased hearing, hoarseness, chest pain, syncope, dyspnea on exertion, peripheral edema, prolonged cough, headaches, hemoptysis, abdominal pain, melena, hematochezia,  severe indigestion/heartburn, hematuria, incontinence, genital sores, muscle weakness, suspicious skin lesions, transient blindness, difficulty walking, depression, unusual weight change, abnormal bleeding, enlarged lymph nodes, angioedema, breast masses, and testicular masses.    Physical Exam  General:  Well-developed,well-nourished,in no acute distress; alert,appropriate and cooperative throughout examination Neck:  No deformities, masses, or tenderness noted. Lungs:  Normal respiratory effort, chest expands symmetrically. Lungs are clear to auscultation, no crackles or wheezes. Heart:  Normal rate and regular rhythm. S1 and S2 normal without gallop, murmur, click, rub or other extra sounds. Abdomen:  Bowel sounds positive,abdomen soft and non-tender without masses, organomegaly or hernias noted.   Impression & Recommendations:  Problem # 1:  PYELONEPHRITIS (ICD-590.80)  Orders: UA Dipstick w/o Micro (manual) (95621)  Problem # 2:  UROSEPSIS (ICD-599.0)  Problem # 3:  DIABETES MELLITUS, TYPE II (ICD-250.00)  The following medications were removed from the medication list:    Humalog Mix 75/25 Kwikpen 75-25 % Susp (Insulin lispro prot & lispro) .Marland KitchenMarland KitchenMarland KitchenMarland Kitchen 75 three times a day    Humulin R 100 Unit/ml Soln (Insulin regular human) .Marland KitchenMarland KitchenMarland KitchenMarland Kitchen 75u three times a day Her updated medication list for this problem includes:    Glucophage 1000 Mg Tabs (Metformin hcl) .Marland Kitchen... 1 once daily    Aspirin 325 Mg Tabs (Aspirin) .Marland Kitchen... 1 once daily    Januvia 100 Mg Tabs (Sitagliptin phosphate) ..... Once daily    Humalog 100 Unit/ml Soln (Insulin lispro (human)) .Marland KitchenMarland KitchenMarland KitchenMarland Kitchen 75 units three times a day    Humulin N 100  Unit/ml Susp (Insulin isophane human) .Marland KitchenMarland KitchenMarland KitchenMarland Kitchen 75 units three times a day  Complete Medication List: 1)  Simvastatin 40 Mg Tabs (Simvastatin) .Marland Kitchen.. 1 by mouth once daily 2)  Glucophage 1000 Mg Tabs (Metformin hcl) .Marland Kitchen.. 1 once daily 3)  Sertraline Hcl 50 Mg Tabs (Sertraline hcl) .Marland Kitchen.. 1 by mouth once  daily 4)  Aspirin 325 Mg Tabs (Aspirin) .Marland Kitchen.. 1 once daily 5)  Zocor 40 Mg Tabs (Simvastatin) .Marland Kitchen.. 1 by mouth once daily 6)  Proair Hfa 108 (90 Base) Mcg/act Aers (Albuterol sulfate) .... 2 puffs every 4 hours as needed sob 7)  Ibuprofen 800 Mg Tabs (Ibuprofen) .Marland Kitchen.. 1 every 6 hours as needed pain 8)  Ketoconazole 2 % Crea (Ketoconazole) .... Apply three times a day as needed 9)  Furosemide 40 Mg Tabs (Furosemide) .... Two times a day 10)  Januvia 100 Mg Tabs (Sitagliptin phosphate) .... Once daily 11)  Humalog 100 Unit/ml Soln (Insulin lispro (human)) .... 75 units three times a day 12)  Humulin N 100 Unit/ml Susp (Insulin isophane human) .... 75 units three times a day  Patient Instructions: 1)  She seems to be recovering from the sepsis as expected. I told her to expect that she will be fatigued for several more weeks and that  she is getting over a serious infection. Finish out the Cipro. Gave her samples to try Januvia in addition to her other meds to try to get better control over her diabetes. She will follow up with Dr. Talmage Nap ASAP.   Laboratory Results   Urine Tests    Routine Urinalysis   Color: yellow Appearance: Clear Glucose: >=1000   (Normal Range: Negative) Bilirubin: negative   (Normal Range: Negative) Ketone: negative   (Normal Range: Negative) Spec. Gravity: 1.020   (Normal Range: 1.003-1.035) Blood: negative   (Normal Range: Negative) pH: 5.0   (Normal Range: 5.0-8.0) Protein: trace   (Normal Range: Negative) Urobilinogen: 0.2   (Normal Range: 0-1) Nitrite: negative   (Normal Range: Negative) Leukocyte Esterace: negative   (Normal Range: Negative)

## 2010-02-23 NOTE — Progress Notes (Signed)
Summary: diflucan & note  Phone Note Call from Patient Call back at [phone]   Caller: vm 4:36 Summary of Call: Was up with fullblown yeast infection, could hardly sit down, all the antibiotics has got me messed up.  Monistat cream.  Too uncomfortable to dress for work.  1) Diflucan requested.  [facility]   Will go back to work [employer]. as Dr. Carmon Ginsberg suggested.   2) Will need another note to return Mon.   Will pick up note when ready.  Please call me when ready.   Initial call taken by: Rudy Jew, RN,  September 21, 2009 4:48 PM  Follow-up for Phone Call        okay for the work note. call in Diflucan 150 mg #1 with 11 rf Follow-up by: Nelwyn Salisbury MD,  September 21, 2009 4:51 PM    New/Updated Medications: DIFLUCAN 150 MG TABS (FLUCONAZOLE) 1 tablet Prescriptions: DIFLUCAN 150 MG TABS (FLUCONAZOLE) 1 tablet  #1 x 11   Entered by:   Raechel Ache, RN   Authorized by:   Nelwyn Salisbury MD   Signed by:   Raechel Ache, RN on 09/21/2009   Method used:   [facility]  [address] [phone][facility] (retail[facility]       [address]       [place], [place]  [address]       Ph: [phone] or [phone]       Fax: [phone]   RxID:   [id number]

## 2010-02-23 NOTE — Assessment & Plan Note (Signed)
Summary: fu from er/dx kidney inf/njr   Vital Signs:  Patient profile:   45 year old female O2 Sat:      91 % on Room air Temp:     99.7 degrees F oral Pulse rate:   92 / minute Pulse rhythm:   regular BP sitting:   150 / 88  (left arm)  Vitals Entered By: Raechel Ache, RN (September 09, 2009 10:52 AM)  O2 Flow:  Room air CC: F/u ER for fever, chills, nausea, aches and abd pain. Treated with IV abx on Wed night and given Cipro. Still has fever and was called by ER and told to f/u with regard to blood cultures.   History of Present Illness: Cheyenne Guzman is here with her husband to follow up on pyelonephritis, which was diagnosed when she went to the ER on 09-07-09. She had had several days of fever, nausea, fatigue, and back pain then, and in the ER she had a positive UA with a normal WBC count of 7.0. She was given IV fluids and oral Cipro, and then sent home. Since then she has not improved at all symptomatically. She still has fevers, nausea without vomitting, back pain, and fatigue. Drinking water but not much. We searched the computer records and found her urine culture was growing gram negative rods, and a blood culture also has gram negative rods. No speciific identification is available.   Allergies (verified): No Known Drug Allergies  Past History:  Past Medical History: Reviewed history from 04/14/2009 and no changes required. Anxiety Depression Diabetes mellitus, type II (sees Dr. Horald Pollen) Hyperlipidemia peripheral neuropathy TIA's 2007 and 2008 Asthma breast cancer, sees Dr. Susanne Greenhouse  Past Surgical History: Reviewed history from 04/14/2009 and no changes required. Hysterectomy Plantar fasciitis in lt foot surgery cadaver nerve graft to right inferior alveolar nerve 06-01-08 per Dr. Mcarthur Rossetti at [facility] [place]. [facility] [place] [facility] after this was               damaged during a root canal excisional biopsy of left ductal carcinoma in situ 11-2007  Family History: Reviewed history from  12/04/2006 and no changes required. Family History of Arthritis Family History Breast cancer 1st degree relative <50 Family History Diabetes 1st degree relative Family History High cholesterol Family History Hypertension Family History Lung cancer  Social History: Reviewed history from 12/04/2006 and no changes required. Married Former Smoker Alcohol use-no  Review of Systems  The patient denies weight loss, weight gain, vision loss, decreased hearing, hoarseness, chest pain, syncope, peripheral edema, prolonged cough, headaches, hemoptysis, abdominal pain, melena, hematochezia, severe indigestion/heartburn, hematuria, incontinence, genital sores, muscle weakness, suspicious skin lesions, transient blindness, difficulty walking, depression, unusual weight change, abnormal bleeding, enlarged lymph nodes, angioedema, and breast masses.    Physical Exam  General:  alert but ill appearing, tearful Neck:  No deformities, masses, or tenderness noted. Lungs:  Normal respiratory effort, chest expands symmetrically. Lungs are clear to auscultation, no crackles or wheezes. Heart:  Normal rate and regular rhythm. S1 and S2 normal without gallop, murmur, click, rub or other extra sounds. Abdomen:  Bowel sounds positive,abdomen soft and non-tender without masses, organomegaly or hernias noted. Positive CVAT.    Impression & Recommendations:  Problem # 1:  UROSEPSIS (ICD-599.0)  Problem # 2:  PYELONEPHRITIS (ICD-590.80)  Complete Medication List: 1)  Simvastatin 40 Mg Tabs (Simvastatin) .Marland Kitchen.. 1 by mouth once daily 2)  Humalog Mix 75/25 Kwikpen 75-25 % Susp (Insulin lispro prot & lispro) .... 75 three times a  day 3)  Glucophage 1000 Mg Tabs (Metformin hcl) .Marland Kitchen.. 1 once daily 4)  Sertraline Hcl 50 Mg Tabs (Sertraline hcl) .Marland Kitchen.. 1 by mouth once daily 5)  Aspirin 325 Mg Tabs (Aspirin) .Marland Kitchen.. 1 once daily 6)  Zocor 40 Mg Tabs (Simvastatin) .Marland Kitchen.. 1 by mouth once daily 7)  Proair Hfa 108 (90 Base)  Mcg/act Aers (Albuterol sulfate) .... 2 puffs every 4 hours as needed sob 8)  Ibuprofen 800 Mg Tabs (Ibuprofen) .Marland Kitchen.. 1 every 6 hours as needed pain 9)  Ketoconazole 2 % Crea (Ketoconazole) .... Apply three times a day as needed 10)  Humulin R 100 Unit/ml Soln (Insulin regular human) .... 75u three times a day 11)  Furosemide 40 Mg Tabs (Furosemide) .... Two times a day  Patient Instructions: 1)  She needs to be admitted for IV antibiotics and hydration. We will have her husband drive her to the ER now to be evaluated and treated.

## 2010-02-23 NOTE — Progress Notes (Signed)
Summary: Return to work note  Phone Note Call from Patient Call back at [employer] [phone] Call back at Work Phone [phone]   Caller: Patient Call For: Nelwyn Salisbury MD Summary of Call: Needs return to work tomorrow note.  Needs to pick up today.  Initial call taken by: Lynann Beaver CMA,  September 20, 2009 1:45 PM  Follow-up for Phone Call        pt cb checking on status of work note. pt is aware note not ready for pick up Follow-up by: Heron Sabins,  September 20, 2009 3:06 PM  Additional Follow-up for Phone Call Additional follow up Details #1::        please get this for her Additional Follow-up by: Nelwyn Salisbury MD,  September 20, 2009 3:18 PM    Additional Follow-up for Phone Call Additional follow up Details #2::    done Follow-up by: Raechel Ache, RN,  September 20, 2009 3:20 PM

## 2010-02-23 NOTE — Assessment & Plan Note (Signed)
Summary: lower extremity edema /bmw   Vital Signs:  Patient profile:   45 year old female Temp:     98.8 degrees F oral Pulse rate:   92 / minute Pulse rhythm:   regular BP sitting:   132 / 94  (right arm)  Vitals Entered By: Raechel Ache, RN (April 14, 2009 10:44 AM) CC: C/o edema. Is Patient Diabetic? Yes   History of Present Illness: Here for the sudden onset 2 weeks ago of swelling all over her body, including hands and feet. She does feel SOB, but has had no chest pain. Her feet are painful. This all started immeduately after a trip to [place], during which she was on her feet all day for several days and she ate a lot of high sodium foods. She took a few Lasix pills several days ago that she got from her mother, and these were helpful. She had been on Femara per Dr. Darnelle Catalan, but this was stopped last month. Her glucoses at home have been mostly in the 200s for months. She has not seen Dr. Horald Pollen for over a year  Allergies (verified): No Known Drug Allergies  Past History:  Past Medical History: Anxiety Depression Diabetes mellitus, type II (sees Dr. Horald Pollen) Hyperlipidemia peripheral neuropathy TIA's 2007 and 2008 Asthma breast cancer, sees Dr. Susanne Greenhouse  Past Surgical History: Hysterectomy Plantar fasciitis in lt foot surgery cadaver nerve graft to right inferior alveolar nerve 06-01-08 per Dr. Mcarthur Rossetti at [place]. [facility] [place] [facility] after this was               damaged during a root canal excisional biopsy of left ductal carcinoma in situ 11-2007  Review of Systems  The patient denies anorexia, fever, weight loss, vision loss, decreased hearing, hoarseness, chest pain, syncope, prolonged cough, headaches, hemoptysis, abdominal pain, melena, hematochezia, severe indigestion/heartburn, hematuria, incontinence, genital sores, muscle weakness, suspicious skin lesions, transient blindness, difficulty walking, depression, unusual weight change, abnormal bleeding, enlarged lymph  nodes, angioedema, breast masses, and testicular masses.    Physical Exam  General:  morbidly obese Neck:  No deformities, masses, or tenderness noted. Lungs:  Normal respiratory effort, chest expands symmetrically. Lungs are clear to auscultation, no crackles or wheezes. Heart:  Normal rate and regular rhythm. S1 and S2 normal without gallop, murmur, click, rub or other extra sounds. Extremities:  2+ edema of both hands and 4+ edema of both feet and lower legs   Impression & Recommendations:  Problem # 1:  PERIPHERAL EDEMA (ICD-782.3)  Her updated medication list for this problem includes:    Furosemide 40 Mg Tabs (Furosemide) .Marland Kitchen..Marland Kitchen Two times a day  Problem # 2:  DIABETES MELLITUS, TYPE II (ICD-250.00)  The following medications were removed from the medication list:    Lantus 100 Unit/ml Soln (Insulin glargine) .Marland KitchenMarland KitchenMarland KitchenMarland Kitchen 50 units at bedtime Her updated medication list for this problem includes:    Humalog Mix 75/25 Kwikpen 75-25 % Susp (Insulin lispro prot & lispro) .Marland KitchenMarland KitchenMarland KitchenMarland Kitchen 75 three times a day    Glucophage 1000 Mg Tabs (Metformin hcl) .Marland Kitchen... 1 once daily    Aspirin 325 Mg Tabs (Aspirin) .Marland Kitchen... 1 once daily    Humulin R 100 Unit/ml Soln (Insulin regular human) .Marland KitchenMarland KitchenMarland KitchenMarland Kitchen 75u three times a day  Orders: Venipuncture (08657) TLB-BMP (Basic Metabolic Panel-BMET) (80048-METABOL) TLB-CBC Platelet - w/Differential (85025-CBCD) TLB-Hepatic/Liver Function Pnl (80076-HEPATIC) TLB-TSH (Thyroid Stimulating Hormone) (84443-TSH) TLB-BNP (B-Natriuretic Peptide) (83880-BNPR) TLB-A1C / Hgb A1C (Glycohemoglobin) (83036-A1C) TLB-Microalbumin/Creat Ratio, Urine (82043-MALB)  Complete Medication List:  1)  Simvastatin 40 Mg Tabs (Simvastatin) .Marland Kitchen.. 1 by mouth once daily 2)  Humalog Mix 75/25 Kwikpen 75-25 % Susp (Insulin lispro prot & lispro) .... 75 three times a day 3)  Glucophage 1000 Mg Tabs (Metformin hcl) .Marland Kitchen.. 1 once daily 4)  Sertraline Hcl 50 Mg Tabs (Sertraline hcl) .Marland Kitchen.. 1 by mouth once daily 5)   Aspirin 325 Mg Tabs (Aspirin) .Marland Kitchen.. 1 once daily 6)  Zocor 40 Mg Tabs (Simvastatin) .Marland Kitchen.. 1 by mouth once daily 7)  Proair Hfa 108 (90 Base) Mcg/act Aers (Albuterol sulfate) .... 2 puffs every 4 hours as needed sob 8)  Ibuprofen 800 Mg Tabs (Ibuprofen) .Marland Kitchen.. 1 every 6 hours as needed pain 9)  Ketoconazole 2 % Crea (Ketoconazole) .... Apply three times a day as needed 10)  Humulin R 100 Unit/ml Soln (Insulin regular human) .... 75u three times a day 11)  Furosemide 40 Mg Tabs (Furosemide) .... Two times a day  Patient Instructions: 1)  Check labs today. Limit daily sodium intake to 1000 mg. use Lasix two times a day . She needs to see Dr. Horald Pollen ASAP Prescriptions: FUROSEMIDE 40 MG TABS (FUROSEMIDE) two times a day  #60 x 5   Entered and Authorized by:   Nelwyn Salisbury MD   Signed by:   Nelwyn Salisbury MD on 04/14/2009   Method used:   [facility]        [facility]  [address] [phone]* (retail)       [address]       [place], [place]  [id number]       Ph: [phone] or [phone]       Fax: [phone]   RxID:   [id number]

## 2010-02-23 NOTE — Letter (Signed)
[facility]   Imported By: Maryln Gottron 02/17/2009 11:09:17  _____________________________________________________________________  External Attachment:    Type:   Image     Comment:   External Document

## 2010-02-23 NOTE — Letter (Signed)
Summary: [facility]  [facility]   Imported By: Lennie Odor 01/11/2010 11:57:39  _____________________________________________________________________  External Attachment:    Type:   Image     Comment:   External Document

## 2010-02-23 NOTE — Progress Notes (Signed)
Summary: temp ? chills   Phone Note Call from Patient   Caller: Daughter Summary of Call: Florentina Addison daughter in and out hosp with sepsis went to hosp last wk and was told she did not have sepsis and no cultures were taken and was told she was "fine" due to her VS normal now having temp 100.5 chills achy .  call [phone].  Initial call taken by: Pura Spice, RN,  November 04, 2009 4:38 PM  Follow-up for Phone Call        I agree that going back to the ER tonight would be very difficult for her, so  I would suggest going to Urgent Care where they can at least do some lab tests  Follow-up by: Nelwyn Salisbury MD,  November 04, 2009 4:52 PM  Additional Follow-up for Phone Call Additional follow up Details #1::        katie aware.  Additional Follow-up by: Pura Spice, RN,  November 04, 2009 5:05 PM

## 2010-03-08 ENCOUNTER — Ambulatory Visit (INDEPENDENT_AMBULATORY_CARE_PROVIDER_SITE_OTHER): Payer: BC Managed Care – PPO | Admitting: Internal Medicine

## 2010-03-08 ENCOUNTER — Encounter: Payer: Self-pay | Admitting: Internal Medicine

## 2010-03-08 VITALS — BP 128/84 | HR 99 | Temp 98.8°F | Ht 67.0 in | Wt 329.0 lb

## 2010-03-08 DIAGNOSIS — J329 Chronic sinusitis, unspecified: Secondary | ICD-10-CM

## 2010-03-08 MED ORDER — AMOXICILLIN 875 MG PO TABS: 875.0000 mg | ORAL_TABLET | Freq: Two times a day (BID) | ORAL | Status: AC

## 2010-03-08 NOTE — Progress Notes (Signed)
  Subjective:     Cheyenne Guzman is a 45 y.o. female who presents for evaluation of sinus pain. Symptoms include: clear rhinorrhea, cough, facial pain, fevers, nasal congestion and sinus pressure. Onset of symptoms was 7 days ago. Symptoms have been gradually worsening since that time. Past history is significant for asthma. No alleviating or exacerbating factors. Attempted mucinex, nasal saline spray without improvement.  The following portions of the patient's history were reviewed and updated as appropriate: allergies, current medications, past medical history and problem list.  Review of Systems Pertinent items are noted in HPI.   Objective:    General appearance: alert and cooperative Head: Normocephalic, without obvious abnormality, atraumatic Eyes: conjunctivae/corneas clear. PERRL, EOM's intact. Fundi benign. Ears: normal TM's and external ear canals both ears Nose: Nares normal. Septum midline. Mucosa normal. No drainage or sinus tenderness., no discharge, sinus tenderness bilateral Throat: lips, mucosa, and tongue normal; teeth and gums normal Neck: no adenopathy, no carotid bruit, no JVD and supple, symmetrical, trachea midline Lungs: clear to auscultation bilaterally    Assessment:    Acute bacterial sinusitis.    Plan:    Nasal saline sprays. Amoxicillin per medication orders.  Followup if no improvement or worsening.

## 2010-04-05 LAB — CBC
HCT: 37.6 % (ref 36.0–46.0)
Hemoglobin: 12.1 g/dL (ref 12.0–15.0)
MCH: 26.9 pg (ref 26.0–34.0)
MCHC: 33.2 g/dL (ref 30.0–36.0)
MCHC: 33.3 g/dL (ref 30.0–36.0)
MCHC: 33.2 g/dL (ref 30.0–36.0)
MCHC: 33.3 g/dL (ref 30.0–36.0)
MCV: 80.9 fL (ref 78.0–100.0)
Platelets: 167 10*3/uL (ref 150–400)
Platelets: 188 10*3/uL (ref 150–400)
Platelets: 213 10*3/uL (ref 150–400)
RDW: 16.6 % — ABNORMAL HIGH (ref 11.5–15.5)
RDW: 16.8 % — ABNORMAL HIGH (ref 11.5–15.5)
RDW: 16.4 % — ABNORMAL HIGH (ref 11.5–15.5)

## 2010-04-05 LAB — BASIC METABOLIC PANEL
BUN: 16 mg/dL (ref 6–23)
CO2: 26 mEq/L (ref 19–32)
Calcium: 7.8 mg/dL — ABNORMAL LOW (ref 8.4–10.5)
Calcium: 9.6 mg/dL (ref 8.4–10.5)
Chloride: 106 mEq/L (ref 96–112)
Creatinine, Ser: 0.78 mg/dL (ref 0.4–1.2)
GFR calc Af Amer: >60 mL/min (ref >60)
GFR calc non Af Amer: >60 mL/min (ref >60)
GFR calc non Af Amer: 57 mL/min — ABNORMAL LOW (ref >60)
Glucose, Bld: 200 mg/dL — ABNORMAL HIGH (ref 70–99)
Glucose, Bld: 204 mg/dL — ABNORMAL HIGH (ref 70–99)
Glucose, Bld: 292 mg/dL — ABNORMAL HIGH (ref 70–99)
Potassium: 4.2 mEq/L (ref 3.5–5.1)
Sodium: 140 mEq/L (ref 135–145)

## 2010-04-05 LAB — URINE CULTURE
Colony Count: 15000
Colony Count: >=100000
Culture  Setup Time: 201110070125

## 2010-04-05 LAB — PROCALCITONIN: Procalcitonin: 0.15 ng/mL

## 2010-04-05 LAB — COMPREHENSIVE METABOLIC PANEL
ALT: 21 U/L (ref 0–35)
AST: 25 U/L (ref 0–37)
Calcium: 7.8 mg/dL — ABNORMAL LOW (ref 8.4–10.5)
GFR calc Af Amer: >60 mL/min (ref >60)
Sodium: 139 mEq/L (ref 135–145)
Total Protein: 6.3 g/dL (ref 6.0–8.3)

## 2010-04-05 LAB — CULTURE, BLOOD (ROUTINE X 2)
Culture  Setup Time: 201110150112
Culture: NO GROWTH

## 2010-04-05 LAB — DIFFERENTIAL
Basophils Absolute: 0 10*3/uL (ref 0.0–0.1)
Basophils Relative: 0 % (ref 0–1)
Basophils Relative: 1 % (ref 0–1)
Eosinophils Absolute: 0.1 10*3/uL (ref 0.0–0.7)
Eosinophils Absolute: 0.2 10*3/uL (ref 0.0–0.7)
Eosinophils Relative: 2 % (ref 0–5)
Eosinophils Relative: 2 % (ref 0–5)
Monocytes Absolute: 0.2 10*3/uL (ref 0.1–1.0)
Monocytes Relative: 6 % (ref 3–12)
Neutro Abs: 4.7 10*3/uL (ref 1.7–7.7)
Neutrophils Relative %: 62 % (ref 43–77)

## 2010-04-05 LAB — MAGNESIUM: Magnesium: 2.4 mg/dL (ref 1.5–2.5)

## 2010-04-05 LAB — GLUCOSE, CAPILLARY
Glucose-Capillary: 142 mg/dL — ABNORMAL HIGH (ref 70–99)
Glucose-Capillary: 158 mg/dL — ABNORMAL HIGH (ref 70–99)
Glucose-Capillary: 189 mg/dL — ABNORMAL HIGH (ref 70–99)
Glucose-Capillary: 219 mg/dL — ABNORMAL HIGH (ref 70–99)

## 2010-04-05 LAB — URINALYSIS, ROUTINE W REFLEX MICROSCOPIC
Bilirubin Urine: NEGATIVE
Glucose, UA: >1000 mg/dL — AB
Hgb urine dipstick: NEGATIVE
Ketones, ur: NEGATIVE mg/dL
Ketones, ur: NEGATIVE mg/dL
Nitrite: NEGATIVE
Protein, ur: NEGATIVE mg/dL
Protein, ur: NEGATIVE mg/dL
Urobilinogen, UA: 1 mg/dL (ref 0.0–1.0)

## 2010-04-05 LAB — CK TOTAL AND CKMB (NOT AT ARMC)
CK, MB: 1.2 ng/mL (ref 0.3–4.0)
Total CK: 58 U/L (ref 7–177)

## 2010-04-05 LAB — D-DIMER, QUANTITATIVE
D-Dimer, Quant: 0.63 ug/mL-FEU — ABNORMAL HIGH (ref 0.00–0.48)
D-Dimer, Quant: 0.23 ug/mL-FEU (ref 0.00–0.48)

## 2010-04-05 LAB — HIV ANTIBODY (ROUTINE TESTING W REFLEX): HIV: NONREACTIVE

## 2010-04-05 LAB — URINE MICROSCOPIC-ADD ON

## 2010-04-05 LAB — C-REACTIVE PROTEIN: CRP: 3.9 mg/dL — ABNORMAL HIGH (ref <0.6)

## 2010-04-06 LAB — PROTIME-INR
INR: 1.04 (ref 0.00–1.49)
INR: 1.05 (ref 0.00–1.49)
Prothrombin Time: 13.9 seconds (ref 11.6–15.2)

## 2010-04-06 LAB — BASIC METABOLIC PANEL
BUN: 10 mg/dL (ref 6–23)
CO2: 24 mEq/L (ref 19–32)
Calcium: 8.7 mg/dL (ref 8.4–10.5)
Chloride: 105 mEq/L (ref 96–112)
Creatinine, Ser: 1 mg/dL (ref 0.4–1.2)
GFR calc Af Amer: >60 mL/min (ref >60)
GFR calc non Af Amer: 59 mL/min — ABNORMAL LOW (ref >60)
Glucose, Bld: 250 mg/dL — ABNORMAL HIGH (ref 70–99)
Sodium: 138 mEq/L (ref 135–145)

## 2010-04-06 LAB — DIFFERENTIAL
Basophils Absolute: 0 10*3/uL (ref 0.0–0.1)
Basophils Absolute: 0 K/uL (ref 0.0–0.1)
Basophils Relative: 0 % (ref 0–1)
Basophils Relative: 0 % (ref 0–1)
Basophils Relative: 1 % (ref 0–1)
Eosinophils Absolute: 0.2 10*3/uL (ref 0.0–0.7)
Eosinophils Absolute: 0.3 10*3/uL (ref 0.0–0.7)
Eosinophils Absolute: 0.1 10*3/uL (ref 0.0–0.7)
Eosinophils Relative: 3 % (ref 0–5)
Eosinophils Relative: 4 % (ref 0–5)
Eosinophils Relative: 1 % (ref 0–5)
Lymphocytes Relative: 29 % (ref 12–46)
Lymphocytes Relative: 30 % (ref 12–46)
Lymphs Abs: 1.9 10*3/uL (ref 0.7–4.0)
Lymphs Abs: 2.4 10*3/uL (ref 0.7–4.0)
Lymphs Abs: 1.1 10*3/uL (ref 0.7–4.0)
Lymphs Abs: 1.1 10*3/uL (ref 0.7–4.0)
Monocytes Absolute: 0.5 K/uL (ref 0.1–1.0)
Monocytes Relative: 7 % (ref 3–12)
Monocytes Relative: 10 % (ref 3–12)
Monocytes Relative: 6 % (ref 3–12)
Neutro Abs: 3.9 10*3/uL (ref 1.7–7.7)
Neutro Abs: 2.7 10*3/uL (ref 1.7–7.7)
Neutrophils Relative %: 59 % (ref 43–77)
Neutrophils Relative %: 61 % (ref 43–77)
Neutrophils Relative %: 63 % (ref 43–77)
Neutrophils Relative %: 77 % (ref 43–77)

## 2010-04-06 LAB — GLUCOSE, CAPILLARY
Glucose-Capillary: 129 mg/dL — ABNORMAL HIGH (ref 70–99)
Glucose-Capillary: 215 mg/dL — ABNORMAL HIGH (ref 70–99)
Glucose-Capillary: 226 mg/dL — ABNORMAL HIGH (ref 70–99)
Glucose-Capillary: 233 mg/dL — ABNORMAL HIGH (ref 70–99)
Glucose-Capillary: 233 mg/dL — ABNORMAL HIGH (ref 70–99)
Glucose-Capillary: 242 mg/dL — ABNORMAL HIGH (ref 70–99)
Glucose-Capillary: 174 mg/dL — ABNORMAL HIGH (ref 70–99)
Glucose-Capillary: 227 mg/dL — ABNORMAL HIGH (ref 70–99)
Glucose-Capillary: 240 mg/dL — ABNORMAL HIGH (ref 70–99)
Glucose-Capillary: 242 mg/dL — ABNORMAL HIGH (ref 70–99)
Glucose-Capillary: 264 mg/dL — ABNORMAL HIGH (ref 70–99)
Glucose-Capillary: 94 mg/dL (ref 70–99)

## 2010-04-06 LAB — COMPREHENSIVE METABOLIC PANEL
ALT: 32 U/L (ref 0–35)
ALT: 18 U/L (ref 0–35)
AST: 31 U/L (ref 0–37)
AST: 37 U/L (ref 0–37)
AST: 20 U/L (ref 0–37)
Albumin: 3.4 g/dL — ABNORMAL LOW (ref 3.5–5.2)
Alkaline Phosphatase: 87 U/L (ref 39–117)
BUN: 11 mg/dL (ref 6–23)
CO2: 24 mEq/L (ref 19–32)
CO2: 26 mEq/L (ref 19–32)
CO2: 26 mEq/L (ref 19–32)
CO2: 27 mEq/L (ref 19–32)
Calcium: 8.6 mg/dL (ref 8.4–10.5)
Calcium: 8.9 mg/dL (ref 8.4–10.5)
Calcium: 8.9 mg/dL (ref 8.4–10.5)
Calcium: 8.8 mg/dL (ref 8.4–10.5)
Chloride: 109 mEq/L (ref 96–112)
Chloride: 103 mEq/L (ref 96–112)
Creatinine, Ser: 0.88 mg/dL (ref 0.4–1.2)
Creatinine, Ser: 0.96 mg/dL (ref 0.4–1.2)
Creatinine, Ser: 1 mg/dL (ref 0.4–1.2)
GFR calc Af Amer: >60 mL/min (ref >60)
GFR calc Af Amer: >60 mL/min (ref >60)
GFR calc Af Amer: >60 mL/min (ref >60)
GFR calc Af Amer: >60 mL/min (ref >60)
GFR calc non Af Amer: >60 mL/min (ref >60)
GFR calc non Af Amer: >60 mL/min (ref >60)
GFR calc non Af Amer: >60 mL/min (ref >60)
GFR calc non Af Amer: 53 mL/min — ABNORMAL LOW (ref >60)
Glucose, Bld: 162 mg/dL — ABNORMAL HIGH (ref 70–99)
Glucose, Bld: 245 mg/dL — ABNORMAL HIGH (ref 70–99)
Glucose, Bld: 231 mg/dL — ABNORMAL HIGH (ref 70–99)
Potassium: 4.1 mEq/L (ref 3.5–5.1)
Sodium: 136 mEq/L (ref 135–145)
Sodium: 138 mEq/L (ref 135–145)
Total Bilirubin: 0.5 mg/dL (ref 0.3–1.2)
Total Protein: 7.3 g/dL (ref 6.0–8.3)

## 2010-04-06 LAB — CBC
HCT: 42.7 % (ref 36.0–46.0)
HCT: 39.6 % (ref 36.0–46.0)
Hemoglobin: 13.8 g/dL (ref 12.0–15.0)
Hemoglobin: 12.9 g/dL (ref 12.0–15.0)
Hemoglobin: 13.3 g/dL (ref 12.0–15.0)
MCH: 25.5 pg — ABNORMAL LOW (ref 26.0–34.0)
MCH: 26 pg (ref 26.0–34.0)
MCH: 26.4 pg (ref 26.0–34.0)
MCH: 26.7 pg (ref 26.0–34.0)
MCHC: 31.5 g/dL (ref 30.0–36.0)
MCHC: 32 g/dL (ref 30.0–36.0)
MCHC: 32.3 g/dL (ref 30.0–36.0)
MCHC: 33.4 g/dL (ref 30.0–36.0)
MCHC: 33.6 g/dL (ref 30.0–36.0)
MCHC: 33.7 g/dL (ref 30.0–36.0)
MCV: 80.9 fL (ref 78.0–100.0)
MCV: 81.5 fL (ref 78.0–100.0)
MCV: 81.8 fL (ref 78.0–100.0)
MCV: 79.9 fL (ref 78.0–100.0)
Platelets: 174 10*3/uL (ref 150–400)
Platelets: 187 10*3/uL (ref 150–400)
Platelets: 201 10*3/uL (ref 150–400)
Platelets: 180 10*3/uL (ref 150–400)
RBC: 5.22 MIL/uL — ABNORMAL HIGH (ref 3.87–5.11)
RBC: 4.82 MIL/uL (ref 3.87–5.11)
RBC: 4.94 MIL/uL (ref 3.87–5.11)
RDW: 15.3 % (ref 11.5–15.5)
RDW: 15.3 % (ref 11.5–15.5)
RDW: 16 % — ABNORMAL HIGH (ref 11.5–15.5)
WBC: 6.5 K/uL (ref 4.0–10.5)
WBC: 7 10*3/uL (ref 4.0–10.5)

## 2010-04-06 LAB — CULTURE, BLOOD (ROUTINE X 2)
Culture: NO GROWTH
Culture: NO GROWTH
Culture: NO GROWTH

## 2010-04-06 LAB — TROPONIN I
Troponin I: 0.01 ng/mL (ref 0.00–0.06)
Troponin I: <0.01 ng/mL (ref 0.00–0.06)
Troponin I: <0.01 ng/mL (ref 0.00–0.06)

## 2010-04-06 LAB — CK TOTAL AND CKMB (NOT AT ARMC)
CK, MB: 2 ng/mL (ref 0.3–4.0)
Relative Index: INVALID (ref 0.0–2.5)
Relative Index: INVALID (ref 0.0–2.5)
Relative Index: INVALID (ref 0.0–2.5)
Total CK: 72 U/L (ref 7–177)

## 2010-04-06 LAB — HEPATIC FUNCTION PANEL
Bilirubin, Direct: 0.1 mg/dL (ref 0.0–0.3)
Indirect Bilirubin: 0.5 mg/dL (ref 0.3–0.9)
Total Bilirubin: 0.6 mg/dL (ref 0.3–1.2)

## 2010-04-06 LAB — TSH
TSH: 8.943 u[IU]/mL — ABNORMAL HIGH (ref 0.350–4.500)
TSH: 9.526 u[IU]/mL — ABNORMAL HIGH (ref 0.350–4.500)

## 2010-04-06 LAB — COMPREHENSIVE METABOLIC PANEL WITH GFR
AST: 37 U/L (ref 0–37)
Albumin: 3.8 g/dL (ref 3.5–5.2)
Alkaline Phosphatase: 88 U/L (ref 39–117)
BUN: 12 mg/dL (ref 6–23)
Chloride: 105 meq/L (ref 96–112)
Potassium: 4.6 meq/L (ref 3.5–5.1)
Total Bilirubin: 0.4 mg/dL (ref 0.3–1.2)

## 2010-04-06 LAB — LIPID PANEL
Cholesterol: 143 mg/dL (ref 0–200)
LDL Cholesterol: 71 mg/dL (ref 0–99)
Total CHOL/HDL Ratio: 4.1 RATIO

## 2010-04-06 LAB — APTT: aPTT: 29 seconds (ref 24–37)

## 2010-04-06 LAB — URINALYSIS, ROUTINE W REFLEX MICROSCOPIC
Glucose, UA: 100 mg/dL — AB
Ketones, ur: NEGATIVE mg/dL
Nitrite: NEGATIVE
Specific Gravity, Urine: 1.02 (ref 1.005–1.030)
pH: 5.5 (ref 5.0–8.0)

## 2010-04-06 LAB — URINE CULTURE: Culture  Setup Time: 201108180456

## 2010-04-06 LAB — URINE MICROSCOPIC-ADD ON

## 2010-04-06 LAB — BRAIN NATRIURETIC PEPTIDE
Pro B Natriuretic peptide (BNP): <30 pg/mL (ref 0.0–100.0)
Pro B Natriuretic peptide (BNP): <30 pg/mL (ref 0.0–100.0)

## 2010-04-06 LAB — HEMOGLOBIN A1C
Hgb A1c MFr Bld: 10.4 % — ABNORMAL HIGH (ref <5.7)
Mean Plasma Glucose: 252 mg/dL — ABNORMAL HIGH (ref <117)
Mean Plasma Glucose: 246 mg/dL — ABNORMAL HIGH (ref <117)

## 2010-04-06 LAB — T4, FREE: Free T4: 0.85 ng/dL (ref 0.80–1.80)

## 2010-04-06 LAB — LIPASE, BLOOD: Lipase: 33 U/L (ref 11–59)

## 2010-04-06 LAB — LACTIC ACID, PLASMA: Lactic Acid, Venous: 1.4 mmol/L (ref 0.5–2.2)

## 2010-04-06 LAB — POCT URINALYSIS DIPSTICK
Glucose, UA: 100 mg/dL — AB
Ketones, ur: NEGATIVE mg/dL
Specific Gravity, Urine: 1.02 (ref 1.005–1.030)

## 2010-04-28 LAB — GLUCOSE, CAPILLARY: Glucose-Capillary: 103 mg/dL — ABNORMAL HIGH (ref 70–99)

## 2010-06-06 NOTE — Letter (Signed)
November 27, 2006     RE:  [name], [name]  MRN:  [id number]  /  DOB:  20-Apr-1965   To whom it may concern:   This letter is concerning a patient of mine by the name of Cheyenne Guzman.  Goya (date of birth [birth year]).   I have served as this patient's primary care physician since I met her  on October 13, 2002.  She has had steadily worsening medical problems,  including morbid obesity, Type 2 diabetes mellitus, and hyperlipidemia.  Over the last several years, her diabetes especially has become more of  a problem, and has become more difficult to treat.  She is now also  seeing an endocrinologist to assist in this endeavor.  Over the past  year, she has had a problem of steadily worsening insulin resistance,  and is now requiring higher and higher amounts of daily insulin to try  to keep her glucose under control.  Even with this, her diabetes has not  been well-controlled.  She is following a strict aggressive diet program  and attempting to exercise, but has been unable to lose any appreciable  amounts of weight.  She now is pursuing a possibility of mini-gastric  bypass surgery at [facility] in [place], [place]  [place].  This letter is to certify first off that he is an excellent  candidate for this surgery.  So far, from a pulmonary, renal, and  cardiovascular standpoint, she should be able to safely proceed with  such a surgery and to recover quickly.  On the other hand, I believe  this is a definite medical necessity for her.  If she is not able to  lose large amounts of weight, she definitely will have her life span  shortened dramatically by some of the problems mentioned above.  I hope  that she  can proceed as quickly as possible through the process of getting  approval for this surgery and actually proceeding with it.   If I may be of further assistance, please let me know.    Sincerely,      Tera Mater. Clent Ridges, MD  Electronically  Signed    SAF/MedQ  DD: 11/27/2006  DT: 11/27/2006  Job #: [id number]

## 2010-06-06 NOTE — Op Note (Signed)
NAMEARYIANA, KLINKNER NO.:  [id number]   MEDICAL RECORD NO.:  [id number]          PATIENT TYPE:  AMB   LOCATION:  DAY                          FACILITY:  [facility]   PHYSICIAN:  Almond Lint, MD       DATE OF BIRTH:  03-29-1965   DATE OF PROCEDURE:  12/15/2007  DATE OF DISCHARGE:                               OPERATIVE REPORT   PREOPERATIVE DIAGNOSIS:  Left breast mass.   POSTOPERATIVE DIAGNOSIS:  Left breast mass.   PROCEDURE PERFORMED:  Left breast biopsy and mastopexy   SURGEON:  Almond Lint, M.D.   ANESTHESIA:  General and local.   FINDINGS:  Questionable lipoma versus normal breast tissue.   SPECIMEN:  Left breast mass to pathology.   ESTIMATED BLOOD LOSS:  Minimal.   COMPLICATIONS:  None known.   PROCEDURE:  Ms. Rathel was identified in the holding area and taken to  the operating room where she was placed on the operating room table in  supine fashion.  General anesthesia with an LMA was induced.  Left  breast was prepped and draped in the sterile fashion.  The incision was  marked in a circumareolar location and made with a 15-blade.  This was  done following administration of local anesthesia.  The skin flaps were  created by elevating the skin with the [facility] retractors and creating  flaps in all directions.  The underlying breast tissue with the vague  mass was elevated with Allis clamps and this was dissected around with  Bovie electrocautery.  The mass was removed and marked short superior,  long lateral.  There was an additional tissue that did not appear to be  part of the mass that was dissected further laterally to create the  mastopexy.  This was done with a 3-0 Vicryl.  The skin was closed using  3-0 Vicryl interrupted and 4-0 Monocryl running subcuticular.  The skin  was cleaned, dried, and dressed with Steri-Strips.  The patient was  awakened from anesthesia and taken to the PACU in stable condition.      Almond Lint, MD  Electronically Signed     FB/MEDQ  D:  12/15/2007  T:  12/15/2007  Job:  [id number]

## 2010-06-06 NOTE — H&P (Signed)
NAME:  Cheyenne Guzman, Cheyenne Guzman NO.:  [id number]   MEDICAL RECORD NO.:  [id number]          PATIENT TYPE:  EMS   LOCATION:  MAJO                         FACILITY:  [facility]   PHYSICIAN:  Gustavus Messing. Orlin Hilding, M.D.DATE OF BIRTH:  1965-02-21   DATE OF ADMISSION:  12/08/2006  DATE OF DISCHARGE:                              HISTORY & PHYSICAL   NEUROLOGY STROKE ADMISSION NOTE   CHIEF COMPLAINT:  Left sided tingling.   HISTORY OF PRESENT ILLNESS:  This was a code stroke call at 7:08 p.m.  I  was originally told that symptom onset was at 6 p.m.  It turns out she  took a nap starting at 5 p.m., but tells me that she actually had onset  of symptoms around 3 p.m. when she noticed tingling on the left side of  her face and arm.  She said she was in a store when this happened.  It  never really went away.  She took a nap at 5 and woke up with worse  symptoms, including tingling all over the entire left side, difficulty  speaking out of the left side of her mouth, feeling like she has to  force her speech and a headache on the left side a 5/10.   PRIMARY CARE PHYSICIAN:  Dr. Gershon Crane at [facility].   ENDOCRINOLOGIST:  Dr. Talmage Nap at [place] [facility].   REVIEW OF SYSTEMS:  Positive for some left eye pain and pressure.  No  chest pain or palpitations.  She had bronchitis last week; took some  antibiotics for that and is finished with it.  No GI or GU complaints.  She has some chronic back pain.  She has the diabetes.  No other  endocrine problems except for morbid obesity, and she has some anxiety.  Review of systems, 12 systems reviewed and otherwise negative.   PAST MEDICAL HISTORY:  Significant for morbid obesity.  She weighs 338  pounds approximately; that is by her telling me.  She had an episode of  possible TIA in November 2007 with similar left sided facial numbness.  She was supposed to be admitted by her primary, but it looks like it  never actually occurred.   She has a history of insulin-dependent  diabetes.  Hypercholesterolemia.  No hypertension.  Some mild peripheral  neuropathy symptoms.  No renal or visual complications.  She had surgery  for plantar fascitis and total hysterectomy in 2006.  Some anxiety, and  bronchitis a week ago.   MEDICATIONS:  1. Lantus 75 units twice a day.  2. Humalog 45 units 3 times a day with meals.  3. Zocor 40 mg once a day.  4. Glucophage 1000 mg twice a day.  5. Zoloft 100 q.h.s.   ALLERGIES:  No known drug allergies.   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  She is married and has one biological child  and one adopted child.  She is G2, P1 with 1 miscarriage.   FAMILY HISTORY:  Very strongly positive  for a stroke at a young age in  women in her family.  Also for hypertension.   PHYSICAL EXAMINATION:  VITAL SIGNS:  On exam, there is no temperature  available yet.  Blood pressure is 143/83, heart rate 104, respirations  22, 100% sat.  HEENT:  Head is normocephalic, atraumatic.  LUNGS:  Clear to auscultation.  NECK:  Supple without bruits.  HEART:  Regular rate and rhythm.  ABDOMEN:  Obese.  SKIN:  Dry.  Mucosa is moist.  NUTRITIONAL STATUS:  She is obese.  NEUROLOGIC EXAM:  She is alert, answers questions correctly, obeys  commands correctly.  Has a normal gait.  No visual loss.  There is a  very minor facial palsy on the left.  No drift of the right upper  extremity.  She does have a slight drift of the left upper extremity.  Very slight drift of the left lower extremity, none on the right.  No  ataxia.  Decreased sensation on the left, partial.  No aphasia.  No  problems with articulation.  No neglect.  She scores a total of 4 on the  stroke scale.  Rankin score would be zero.   LABORATORY DATA:  WBC 8.2, hemoglobin 13.7, hematocrit 40.6, platelets  258.  PT-INR, PTT and CMET are pending at this time.  CT of the brain  was  normal.   ASSESSMENT:  Left sided paresthesias, slight facial droop and slight  drift in the setting of diabetes and hypercholesterolemia.  This may be  a possible right brain stroke with minimal deficit.  We will admit her  for a stroke workup with MRI/MRA, 2-D echocardiogram, transcranial  Doppler, carotid Doppler, homocystine and lipids.  She is too far out  for TPA.  In addition, her symptoms are fairly minimal.  Her score of 4  precludes her inclusion of any stroke studies since the deficit is too  minimal.      Santina Evans A. Orlin Hilding, M.D.  Electronically Signed     CAW/MEDQ  D:  12/08/2006  T:  12/08/2006  Job:  [id number]

## 2010-06-06 NOTE — Discharge Summary (Signed)
NAMESARITA, HAKANSON NO.:  [id number]   MEDICAL RECORD NO.:  [id number]          PATIENT TYPE:  INP   LOCATION:  3022                         FACILITY:  [facility]   PHYSICIAN:  Pramod P. Pearlean Brownie, MD    DATE OF BIRTH:  10/12/1965   DATE OF ADMISSION:  12/08/2006  DATE OF DISCHARGE:  12/10/2006                               DISCHARGE SUMMARY   DIAGNOSES AT TIME OF DISCHARGE:  1. Right brain transient ischemic attack versus small-vessel infarct,      unable to verify with MRI secondary to the patient's body habitus      size, symptoms resolved.  2. Dyslipidemia.  3. Diabetes, poorly controlled.  4. Morbid obesity.  5. Peripheral neuropathy secondary to diabetes next.  6. Total hysterectomy in 2006.  7. Plantar fasciitis.  8. Anxiety.  9. Bronchitis.  10.Questionable transient ischemic attack November 2007.   MEDICINES AT TIME OF DISCHARGE:  1. Lantus 75 units b.i.d.  2. Humalog insulin 45 units subcu three times a day with meals.  3. Zocor 40 mg a day.  4. Glucophage 1000 mg b.i.d.  5. Zoloft 100 mg at bedtime.  6. Aspirin 325 mg a day.   STUDIES PERFORMED:  1. CT of the brain on admission shows mastoid calcifications, no acute      abnormality.  2. MRI of the brain could not be performed secondary to the patient's      body habitus and weight greater than 300.  3. EKG shows normal sinus rhythm, low-voltage QRS.  4. Transcranial Doppler performed, results pending.  5. 2-D echocardiogram performed, results pending.  6. Carotid Doppler shows no ICA stenosis.   LABORATORY STUDIES:  Cholesterol 176, triglycerides 209, HDL 33, LDL  101.  Hemoglobin A1c 10.8.  Homocystine 7.0.  coagulation studies on  admission were normal.  CBC normal.  Chemistry with glucose 400,  otherwise normal.  Liver function tests normal with slightly elevated  total bilirubin at 1.3.   HISTORY OF PRESENT ILLNESS:  Cheyenne Guzman is a 45 year old morbidly  obese Caucasian female who  had sudden onset of left face and arm  tingling.  It was initially reported that the symptoms began at 6 p.m.  However, it turned out she took a nap starting at 5 p.m. but that she  actually had onset of symptoms around 3 p.m. when she noticed tingling  in the left side of her face and arm.  She said she was in a store when  this happened.  It never really went away.  She took a nap at 5 p.m. and  woke up with worse symptoms, including tingling over the entire left  side, difficulty speaking out of the left side of her mouth, feeling  like she had to force her speech, and a headache on the left side at  5/10.  t-PA was considered but not given secondary to being greater than  3 hours since symptom onset and rapid improvement.  She was admitted to  the hospital for further stroke evaluation.   HOSPITAL COURSE:  CT showed no acute abnormality.  MRI was unable to be  performed secondary to the patient's body habitus.  She was found to  have vascular risk factors of possible previous TIA, diabetes poorly-  controlled, cholesterol elevated and obesity.  Workup was completed in  the hospital except for MRI.  Her neurologic symptoms improved.  She was  on aspirin prior to admission but had stopped taking it for the past 6  months.  Aspirin was resumed for secondary stroke prevention.  The  patient needs ongoing tight risk factor control by primary care  physician and endocrinologist with an outpatient MRI and plans to follow  up with Dr. Pearlean Brownie in 2-3 months.   CONDITION AT DISCHARGE:  The patient is stable, no complaints, alert and  oriented x3.  No aphasia.  No dysarthria.  Face is symmetric.  She has  no drift, no focal weakness.   DISCHARGE PLAN:  1. Discharge home with family.  2. Aspirin for secondary stroke prevention.  3. Tight risk factor control by primary care physician and Dr. Talmage Nap.  4. Follow up primary care physician within 1 month.  5. Follow up with Dr. Talmage Nap within 1  month.  6. Follow up with Dr. Delia Heady in 2-3 months.  7. Outpatient MRI.  Please see discharge instructions for details.      Cheyenne Guzman, N.P.    ______________________________  Cheyenne Guzman. Pearlean Brownie, MD    SB/MEDQ  D:  12/10/2006  T:  12/10/2006  Job:  [id number]   cc:   Dorisann Frames, M.D.  Tera Mater. Clent Ridges, MD
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
## 2010-06-09 NOTE — H&P (Signed)
[facility]  Patient:    Cheyenne Guzman, Cheyenne Guzman                       MRN: [id number] Adm. Date:  [id number] Disc. Date: [id number] Attending:  Feliciana Rossetti                         History and Physical  CHIEF COMPLAINT:  Blurred vision and fever.  HISTORY OF PRESENT ILLNESS:  A 45 year old white female who had acute worsening of right eye pain, fever, chills, nausea, and decreased visual acuity the day of admission.  The patient was recently admitted to the hospital for a periorbital cellulitis for which she was treated with intravenous Tequin and Augmentin orally.  She was seen in consult by ophthalmology and this management was continued.  The patient was discharged home on oral Augmentin and Tequin after three days of intravenous medicine as recommended by ophthalmology.  Still the patient decompensated on oral antibiotics and presented to my office this day of admission with the above complaints.  She denied double vision and blindness.  She endorsed headache that was right-sided.  She denied focal numbness, paresthesias, or weakness. She denies any diarrhea or change in bowel or bladder habits.  She denied any rash or skin breakdown.  She denied abdominal pain or leg swelling or lightheadedness.  She did endorse increased thirst.  PAST MEDICAL HISTORY:  1. NIDDM (diet controlled).  2. Periorbital cellulitis. 3. Traumatic brain injury (seen as an outpatient only in the [facility]).  PAST SURGICAL HISTORY:  Foot surgery.  MEDICINES: 1. Zoloft 100 mg p.o. q.h.s. 2. Augmentin 875 mg one p.o. b.i.d. 3. Tequin 400 mg p.o. q.d.  ALLERGIES:  CODEINE.  LABORATORY DATA:  CBC, differential, BMET, and blood culture pending at the time of dictation.  FAMILY HISTORY:  Notable for diabetes mellitus, early cerebrovascular accident, and coronary artery disease.  No colon cancer, breast cancer, or ovarian cancer.  SOCIAL HISTORY:  The  patient does not drink or smoke.  She works and lives with husband and child.  REVIEW OF SYSTEMS:  No trauma to eye.  No increasing tearing.  No swelling. See HPI.  PHYSICAL EXAMINATION:  Heart rate 110, temperature 100, respiratory rate 80, blood pressure 130/80.  GENERAL APPEARANCE:  Nontoxic and in no acute distress.  HEENT:  The pupils are equal, round, and reactive to light.  No Marcus Gunn pupil.  Fundi without exudate or hemorrhage.  Normal disk edges.  Visual fields are intact.  No ecchymosis.  No lid edema.  No fluctuance.  Tenderness to palpation of globe.  Oropharynx without lesions.  Mucous membranes moist.  NECK:  Supple.  Lymph node survey negative.  HEART:  Regular rhythm.  Rate of 100.  No murmurs, rubs, or gallops.  LUNGS:  Clear to auscultation.  Good symmetric air movement.  ABDOMEN:  Soft.  No hepatosplenomegaly.  No mass.  No rebound.  No guarding. Normal bowel sounds.  EXTREMITIES:  No clubbing, cyanosis, or edema.  Bilateral nonpitting edema that is symmetric without cords or Homans.  NEUROLOGIC:  Cranial nerves II-XII are grossly within normal limits.  Muscle strength is 5/5 and symmetric.  DTRs are 2 and symmetric.  RECTAL:  Tone is normal with no blood in vault.  PELVIC:  Exam is deferred to outpatient setting.  BREASTS:  Exam is deferred to outpatient setting.  ASSESSMENT AND PLAN: 1. Periorbital cellulitis:  Tequin 400 mg IV q.d., Augmentin 875 mg p.o.    b.i.d.  Will consult ophthalmology. 2. Non-insulin-dependent diabetes mellitus:  Patient refusing diabetic diet.    Will use b.i.d. Accu-Cheks and Starlix as necessary. DD:  02/04/00 TD:  02/04/00 Job: [id number]

## 2010-06-09 NOTE — Discharge Summary (Signed)
[facility]  Patient:    Cheyenne Guzman, Cheyenne Guzman                       MRN: [id number] Adm. Date:  [id number] Disc. Date: [id number] Attending:  Feliciana Rossetti                           Discharge Summary  DISCHARGE DIAGNOSES: 1. Abscess of eyelid. 2. Non-insulin-dependent diabetes mellitus. 3. Headache.  REASON FOR HOSPITALIZATION:  Abscess of eyelid and other disorders of the eye.  HOSPITAL COURSE:  A 45 year old who was seen three days prior to admission and the diagnosis of periorbital cellulitis was made.  The patient was treated with intravenous antibiotics, and was planned for outpatient intravenous antibiotics, though it was just dictated the patient would receive oral Tequin therapy as an outpatient.  The patient worsened subjectively in the face of this oral therapy, specifically, having increased blurred vision and fever. Given the potential gravity of this diagnosis, the patient was referred back to [facility] for repeat admission.  Here, she was again seen in consult by ophthalmology, and additionally the patient had her initial presentation of adult onset diabetes mellitus.  For this, she was referred to outpatient diabetic teaching, and Glucophage therapy was begun.  Morphine intravenous medicine was ordered for the patients severe eye pain, and Tequin intravenous therapy was begun.  CT of the brain without contrast was ordered to evaluate for orbital cellulitis.  Sliding scale insulin was used.  Per ophthalmologic recommendations, CT sinus and evaluation of the orbital apex was obtained.  The findings of this were encouraging, and by February 08, 2000, the patient had defervesced and remained afebrile, and she was felt ready for discharge home.  CONDITION ON DISCHARGE:  The patient is well-appearing, ambulating with ease, with no further eye swelling and with no visual complaints.  DISCHARGE MEDICATIONS: 1. Tequin 400 mg p.o. q.d. x 10  days. 2. Augmentin 875 mg p.o. b.i.d. x 10 days. 3. Glucophage XR 1 g q.d. was begun.  FOLLOWUP:  Dr. Quintella Reichert for 4 weeks following discharge.  DISCHARGE VITAL SIGNS:  120/80, 93, 99, and 10. DD:  02/22/00 TD:  02/23/00 Job: [id number]

## 2010-06-09 NOTE — Discharge Summary (Signed)
[name], Cheyenne Guzman NO.:  [id number]   MEDICAL RECORD NO.:  [id number]          PATIENT TYPE:  OBV   LOCATION:  9317                          FACILITY:  [facility]   PHYSICIAN:  Juluis Mire, M.D.   DATE OF BIRTH:  06-02-1965   DATE OF ADMISSION:  09/18/2004  DATE OF DISCHARGE:  09/19/2004                                 DISCHARGE SUMMARY   ADMISSION DIAGNOSES:  Menorrhagia secondary to adenomyosis.   DISCHARGE DIAGNOSES:  Menorrhagia secondary to adenomyosis.   PROCEDURE:  Laparoscopic assisted vaginal hysterectomy, bilateral salpingo-  oophorectomy.   HISTORY OF PRESENT ILLNESS:  For complete history and physical, see dictated  note.   HOSPITAL COURSE:  The patient underwent above-noted surgery. Pathology is  still pending.  She did well postoperatively.  Her postoperative hemoglobin  was 12.7.  At her first postoperative day she was tolerating her diet.  She  was voiding without difficulty.  She had no active vaginal bleeding. Her  abdominal examination was benign.  Incisions were intact. Bowel sounds were  active.  She was completely afebrile. Blood sugar was 107.  She was on a  Glucomander.  At the present time will switch her back to Glucophage,  regular diet that she is tolerating well and will discharge her home this  afternoon.   COMPLICATIONS:  None were encountered during her stay in the hospital.   DISPOSITION:  Patient is discharged home in stable condition.  Routine  instructions were given, avoid heavy lifting, vaginal entry, avoid driving a  car.  She is to watch for signs of infection, nausea and vomiting,  increasing abdominal pain, active vaginal bleeding, or signs of deep venous  thrombosis or pulmonary embolus.  She will be discharged home on Tylox as  needed for pain.  She will go back on her Glucophage and insulin replacement  as followed by her primary care doctor.  We will see her back in one week.      Juluis Mire, M.D.  Electronically Signed     JSM/MEDQ  D:  09/19/2004  T:  09/19/2004  Job:  [id number]

## 2010-06-09 NOTE — Op Note (Signed)
NAMERANDA, RISS NO.:  [id number]   MEDICAL RECORD NO.:  [id number]          PATIENT TYPE:  OBV   LOCATION:  9317                          FACILITY:  [facility]   PHYSICIAN:  Juluis Mire, M.D.   DATE OF BIRTH:  08-07-1965   DATE OF PROCEDURE:  09/18/2004  DATE OF DISCHARGE:  09/19/2004                                 OPERATIVE REPORT   PREOPERATIVE DIAGNOSES:  1.  Menorrhagia with associated dysmenorrhea.  2.  Probable adenomyosis versus endometriosis.   POSTOPERATIVE DIAGNOSES:  1.  Menorrhagia with associated dysmenorrhea.  2.  Probable adenomyosis versus endometriosis.  3.  Pathology pending.   OPERATIVE PROCEDURE:  Laparoscopically-assisted vaginal hysterectomy with  bilateral salpingo-oophorectomy.   SURGEON:  Juluis Mire, M.D.   ASSISTANT:  Zelphia Cairo, M.D.   ANESTHESIA:  General endotracheal.   ESTIMATED BLOOD LOSS:  200-300 mL.   PACKS AND DRAINS:  None.   INTRAOPERATIVE BLOOD REPLACED:  None.   COMPLICATIONS:  None.   INDICATIONS:  Dictated in the history and physical.   PROCEDURE:  The patient was taken to the OR, placed in the supine position.  After a satisfactory level of general endotracheal anesthesia was obtained,  the patient was placed in the dorsal lithotomy position using the Allen  stirrups.  The abdomen, perineum and vagina were prepped out with Betadine.  The bladder was emptied by in-and-out catheterization.  A Hulka tenaculum  was put in place and secured.  The patient was then draped in a sterile  field.  A subumbilical incision made with a knife and extended through the  subcutaneous tissue.  Fascia was identified and entered sharply and the  incision in the fascia extended laterally.  We then separated the muscles.  We identified the peritoneum and entered sharply.  The Taut open  laparoscopic trocar put in place and secured.  The abdomen was inflated with  carbon dioxide, laparoscope was introduced.  There  was no evidence of injury  to adjacent organs.  We could not see the appendix.  The upper abdomen  including the liver and the tip of the gallbladder were clear.  The uterus  was upper limits of normal size.  Tubes and ovaries were unremarkable.  A 5  mm trocar was put in place in the suprapubic area under direct  visualization.  We first went to the right adnexa.  Tubes and ovaries were  unremarkable.  We could identify the ureter along the pelvic sidewall.  We  elevated the ovary, identified the ovarian vasculature using the Gyrus  bipolar as we cauterized, incised the ovarian vasculature.  We separated the  ovary from its mesenteric attachment with cautery and incision.  This went  up to the round ligament.  The round ligament was then cauterized, incised  on the right side.  The right round ligament was cauterized, incised.  We  then went to the left side.  The left ovary was elevated.  The left ovarian  vasculature was identified.  The ureter was easily seen along the left  pelvic sidewall.  The left ovarian  vasculature was cauterized, incised.  The  mesenteric attachments of the left ovary were cauterized, incised, and the  left round ligament was cauterized, incised.  We had complete freeing of  both adnexa.  The left broad ligament was also cauterized, incised.  We then  went vaginally.   The legs were repositioned.  The Hulka was then removed, a weighted speculum  was placed in the vaginal vault and the cervix grasped with a Jacobs  tenaculum.  The cul-de-sac entered sharply.  Both uterosacral ligament were  clamped, cut, and suture ligated with 0 Vicryl.  The reflection of the  vaginal mucosa anteriorly was incised and the bladder was dissected  superiorly.  Paracervical tissue was clamped, cut, and suture ligated with 0  Vicryl.  The vesicouterine space identified and entered sharply and  retractors put in place.  Using the clamp, cut and tie technique with suture  ligature of  0 Vicryl, the parametrium was serially separated from the sides  of the uterus.  The uterus was then flipped.  The remaining pedicles were  clamped and cut.  The uterus, tubes, and ovaries were passed off the  operative field.  Held pedicles were secured with free ties of 0 Vicryl.  We  had good hemostasis.  The vaginal mucosa was reapproximated in the midline  with interrupted figure-of-eights of 0 Vicryl.  The Foley was placed to  straight drain with retrieval of an adequate amount of clear urine.  A  sponge on a sponge stick was placed in the vaginal vault.   The laparoscope was reintroduced, abdomen was reinflated of its carbon  dioxide.  We had some minimal bleeding of the cul-de-sac brought under  control with the Gyrus.  Otherwise, we had good hemostasis at the ovarian  pedicles and the cuff.  At this point in time the abdomen was deflated of  its carbon dioxide, all trocars removed.  Subumbilical fascia closed with  two figure-of-eights of 0 Vicryl, skin with interrupted subcuticular of 4-0  Vicryl.  Suprapubic incisions were closed with Dermabond.  Urine output  remained clear and adequate.  Sponge, instrument and needle count reported  as correct by the circulating nurse x2.  The patient tolerated the procedure  well and once extubated returned to the recovery room in good condition.      Juluis Mire, M.D.  Electronically Signed     JSM/MEDQ  D:  11/02/2004  T:  11/03/2004  Job:  [id number]
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
## 2010-07-31 ENCOUNTER — Other Ambulatory Visit: Payer: Self-pay | Admitting: Family Medicine

## 2010-07-31 MED ORDER — SERTRALINE HCL 50 MG PO TABS: 50.0000 mg | ORAL_TABLET | Freq: Every day | ORAL | Status: DC

## 2010-07-31 MED ORDER — SIMVASTATIN 40 MG PO TABS: 40.0000 mg | ORAL_TABLET | Freq: Every day | ORAL | Status: DC

## 2010-09-26 ENCOUNTER — Emergency Department (HOSPITAL_COMMUNITY)
Admission: EM | Admit: 2010-09-26 | Discharge: 2010-09-26 | Disposition: A | Discharge status: Home / Self Care | Payer: BC Managed Care – PPO | Attending: Emergency Medicine | Admitting: Emergency Medicine

## 2010-09-26 ENCOUNTER — Telehealth: Payer: Self-pay | Admitting: *Deleted

## 2010-09-26 ENCOUNTER — Emergency Department (HOSPITAL_COMMUNITY): Payer: BC Managed Care – PPO

## 2010-09-26 DIAGNOSIS — Z8673 Personal history of transient ischemic attack (TIA), and cerebral infarction without residual deficits: Secondary | ICD-10-CM | POA: Insufficient documentation

## 2010-09-26 DIAGNOSIS — E119 Type 2 diabetes mellitus without complications: Secondary | ICD-10-CM | POA: Insufficient documentation

## 2010-09-26 DIAGNOSIS — R229 Localized swelling, mass and lump, unspecified: Secondary | ICD-10-CM | POA: Insufficient documentation

## 2010-09-26 DIAGNOSIS — Z853 Personal history of malignant neoplasm of breast: Secondary | ICD-10-CM | POA: Insufficient documentation

## 2010-09-26 DIAGNOSIS — Z794 Long term (current) use of insulin: Secondary | ICD-10-CM | POA: Insufficient documentation

## 2010-09-26 DIAGNOSIS — X58XXXA Exposure to other specified factors, initial encounter: Secondary | ICD-10-CM | POA: Insufficient documentation

## 2010-09-26 DIAGNOSIS — IMO0002 Reserved for concepts with insufficient information to code with codable children: Secondary | ICD-10-CM | POA: Insufficient documentation

## 2010-09-26 LAB — GLUCOSE, CAPILLARY: Glucose-Capillary: 308 mg/dL — ABNORMAL HIGH (ref 70–99)

## 2010-09-26 NOTE — Telephone Encounter (Signed)
Pt leaves voice mail stating she has some sort of foreign body in foot, and does not know what it is, and cannot get it out.  Per Dr. Clent Ridges .. patient should go to ER for possible xrays and evaluation. Pt notified, and will follow instructions.

## 2010-09-26 NOTE — Telephone Encounter (Signed)
As above.

## 2010-10-16 ENCOUNTER — Emergency Department (HOSPITAL_COMMUNITY)
Admission: EM | Admit: 2010-10-16 | Discharge: 2010-10-16 | Disposition: A | Discharge status: Home / Self Care | Payer: BC Managed Care – PPO | Attending: Emergency Medicine | Admitting: Emergency Medicine

## 2010-10-16 ENCOUNTER — Emergency Department (HOSPITAL_COMMUNITY): Payer: BC Managed Care – PPO

## 2010-10-16 DIAGNOSIS — Z853 Personal history of malignant neoplasm of breast: Secondary | ICD-10-CM | POA: Insufficient documentation

## 2010-10-16 DIAGNOSIS — R110 Nausea: Secondary | ICD-10-CM | POA: Insufficient documentation

## 2010-10-16 DIAGNOSIS — Z7982 Long term (current) use of aspirin: Secondary | ICD-10-CM | POA: Insufficient documentation

## 2010-10-16 DIAGNOSIS — R609 Edema, unspecified: Secondary | ICD-10-CM | POA: Insufficient documentation

## 2010-10-16 DIAGNOSIS — R05 Cough: Secondary | ICD-10-CM | POA: Insufficient documentation

## 2010-10-16 DIAGNOSIS — Z8673 Personal history of transient ischemic attack (TIA), and cerebral infarction without residual deficits: Secondary | ICD-10-CM | POA: Insufficient documentation

## 2010-10-16 DIAGNOSIS — Z79899 Other long term (current) drug therapy: Secondary | ICD-10-CM | POA: Insufficient documentation

## 2010-10-16 DIAGNOSIS — E119 Type 2 diabetes mellitus without complications: Secondary | ICD-10-CM | POA: Insufficient documentation

## 2010-10-16 DIAGNOSIS — R197 Diarrhea, unspecified: Secondary | ICD-10-CM | POA: Insufficient documentation

## 2010-10-16 DIAGNOSIS — R0989 Other specified symptoms and signs involving the circulatory and respiratory systems: Secondary | ICD-10-CM | POA: Insufficient documentation

## 2010-10-16 DIAGNOSIS — I10 Essential (primary) hypertension: Secondary | ICD-10-CM | POA: Insufficient documentation

## 2010-10-16 DIAGNOSIS — R0609 Other forms of dyspnea: Secondary | ICD-10-CM | POA: Insufficient documentation

## 2010-10-16 DIAGNOSIS — Z794 Long term (current) use of insulin: Secondary | ICD-10-CM | POA: Insufficient documentation

## 2010-10-16 DIAGNOSIS — R059 Cough, unspecified: Secondary | ICD-10-CM | POA: Insufficient documentation

## 2010-10-16 DIAGNOSIS — R42 Dizziness and giddiness: Secondary | ICD-10-CM | POA: Insufficient documentation

## 2010-10-16 LAB — BASIC METABOLIC PANEL
BUN: 13 mg/dL (ref 6–23)
CO2: 29 mEq/L (ref 19–32)
Calcium: 9.3 mg/dL (ref 8.4–10.5)
Chloride: 103 mEq/L (ref 96–112)
Creatinine, Ser: 0.79 mg/dL (ref 0.50–1.10)

## 2010-10-16 LAB — CBC
HCT: 40.6 % (ref 36.0–46.0)
MCHC: 32.5 g/dL (ref 30.0–36.0)
MCV: 81.4 fL (ref 78.0–100.0)
Platelets: 172 10*3/uL (ref 150–400)
RDW: 15.1 % (ref 11.5–15.5)
WBC: 6.2 10*3/uL (ref 4.0–10.5)

## 2010-10-16 LAB — PRO B NATRIURETIC PEPTIDE: Pro B Natriuretic peptide (BNP): 107.1 pg/mL (ref 0–125)

## 2010-10-16 LAB — D-DIMER, QUANTITATIVE: D-Dimer, Quant: 0.29 ug/mL-FEU (ref 0.00–0.48)

## 2010-10-16 LAB — GLUCOSE, CAPILLARY: Glucose-Capillary: 289 mg/dL — ABNORMAL HIGH (ref 70–99)

## 2010-10-24 LAB — CBC
HCT: 41.1
Hemoglobin: 13.6
MCHC: 33.2
MCV: 81.1
Platelets: 233
RDW: 15

## 2010-10-24 LAB — BASIC METABOLIC PANEL
BUN: 11
Calcium: 9.2
Chloride: 104
Creatinine, Ser: 0.78
GFR calc Af Amer: >60

## 2010-10-24 LAB — GLUCOSE, CAPILLARY
Glucose-Capillary: 219 — ABNORMAL HIGH
Glucose-Capillary: 256 — ABNORMAL HIGH

## 2010-10-24 LAB — DIFFERENTIAL
Basophils Absolute: 0
Basophils Relative: 1
Eosinophils Absolute: 0.2
Eosinophils Relative: 2
Monocytes Absolute: 0.4

## 2010-10-24 LAB — GLUCOSE, RANDOM: Glucose, Bld: 261 — ABNORMAL HIGH

## 2010-10-31 LAB — COMPREHENSIVE METABOLIC PANEL
ALT: 27
Alkaline Phosphatase: 90
BUN: 15
Chloride: 99
Glucose, Bld: 400 — ABNORMAL HIGH
Potassium: 4.9
Sodium: 136
Total Bilirubin: 1.3 — ABNORMAL HIGH

## 2010-10-31 LAB — DIFFERENTIAL
Basophils Absolute: 0.1
Basophils Relative: 1
Eosinophils Absolute: 0.2
Monocytes Absolute: 0.5
Neutro Abs: 5.3
Neutrophils Relative %: 65

## 2010-10-31 LAB — APTT: aPTT: 29

## 2010-10-31 LAB — CBC
HCT: 40.6
Hemoglobin: 13.7
MCHC: 33.8
MCV: 79.6
RDW: 14.5

## 2010-10-31 LAB — LIPID PANEL
Cholesterol: 176
HDL: 33 — ABNORMAL LOW
LDL Cholesterol: 101 — ABNORMAL HIGH

## 2010-10-31 LAB — HEMOGLOBIN A1C
Hgb A1c MFr Bld: 10.8 — ABNORMAL HIGH
Mean Plasma Glucose: 307

## 2010-10-31 LAB — PROTIME-INR: INR: 1

## 2010-11-04 ENCOUNTER — Other Ambulatory Visit: Payer: Self-pay | Admitting: Family Medicine

## 2010-11-07 NOTE — Telephone Encounter (Signed)
Refill both for one year. 

## 2010-12-28 ENCOUNTER — Encounter: Payer: Self-pay | Admitting: Family Medicine

## 2010-12-28 ENCOUNTER — Ambulatory Visit (INDEPENDENT_AMBULATORY_CARE_PROVIDER_SITE_OTHER): Payer: BC Managed Care – PPO | Admitting: Family Medicine

## 2010-12-28 VITALS — BP 130/82 | HR 86 | Temp 98.7°F

## 2010-12-28 DIAGNOSIS — J40 Bronchitis, not specified as acute or chronic: Secondary | ICD-10-CM

## 2010-12-28 DIAGNOSIS — J45901 Unspecified asthma with (acute) exacerbation: Secondary | ICD-10-CM

## 2010-12-28 MED ORDER — INSULIN NPH (HUMAN) (ISOPHANE) 100 UNIT/ML ~~LOC~~ SUSP: 75.0000 [IU] | Freq: Three times a day (TID) | SUBCUTANEOUS | Status: DC

## 2010-12-28 MED ORDER — AZITHROMYCIN 250 MG PO TABS: ORAL_TABLET | ORAL | Status: AC

## 2010-12-28 MED ORDER — HYDROCODONE-HOMATROPINE 5-1.5 MG/5ML PO SYRP: 5.0000 mL | ORAL_SOLUTION | ORAL | Status: AC | PRN

## 2010-12-28 MED ORDER — ALBUTEROL SULFATE HFA 108 (90 BASE) MCG/ACT IN AERS: 2.0000 | INHALATION_SPRAY | RESPIRATORY_TRACT | Status: DC | PRN

## 2010-12-28 NOTE — Progress Notes (Signed)
  Subjective:    Patient ID: Cheyenne Guzman, female    DOB: May 17, 1965, 45 y.o.   MRN: [id number]  HPI Here for 5 days of chest tightness, fever, and coughing up green sputum.    Review of Systems  Constitutional: Positive for fever.  HENT: Negative.   Eyes: Negative.   Respiratory: Positive for cough, chest tightness and wheezing.        Objective:   Physical Exam  Constitutional: She appears well-developed and well-nourished.  HENT:  Right Ear: External ear normal.  Left Ear: External ear normal.  Nose: Nose normal.  Mouth/Throat: Oropharynx is clear and moist.  Eyes: Conjunctivae are normal.  Neck: No thyromegaly present.  Pulmonary/Chest: Effort normal. She has wheezes. She has no rales.  Lymphadenopathy:    She has no cervical adenopathy.          Assessment & Plan:  Refilled albuterol prn. Add Mucinex.

## 2011-01-23 DIAGNOSIS — T8859XA Other complications of anesthesia, initial encounter: Secondary | ICD-10-CM

## 2011-01-23 HISTORY — DX: Other complications of anesthesia, initial encounter: T88.59XA

## 2011-01-29 ENCOUNTER — Encounter: Payer: Self-pay | Admitting: Family

## 2011-01-29 ENCOUNTER — Ambulatory Visit (INDEPENDENT_AMBULATORY_CARE_PROVIDER_SITE_OTHER): Payer: BC Managed Care – PPO | Admitting: Family

## 2011-01-29 VITALS — Temp 99.0°F

## 2011-01-29 DIAGNOSIS — R300 Dysuria: Secondary | ICD-10-CM

## 2011-01-29 DIAGNOSIS — M545 Low back pain, unspecified: Secondary | ICD-10-CM

## 2011-01-29 LAB — POCT URINALYSIS DIPSTICK
Glucose, UA: NEGATIVE
Nitrite, UA: NEGATIVE
Spec Grav, UA: >=1.03
Urobilinogen, UA: 0.2

## 2011-01-29 MED ORDER — CIPROFLOXACIN HCL 500 MG PO TABS: 500.0000 mg | ORAL_TABLET | Freq: Two times a day (BID) | ORAL | Status: AC

## 2011-01-29 NOTE — Patient Instructions (Signed)

## 2011-01-29 NOTE — Progress Notes (Signed)
Subjective:    Patient ID: Cheyenne Guzman, female    DOB: February 14, 1965, 46 y.o.   MRN: [id number]  HPI 46 year old white female, former smoker patient of Dr. Clent Ridges exam with complaints of urinary odor, low back pain, fever and chills x4 days. She has a history of pyelonephritis that landed her in the hospital and last year with the similar symptoms. She has not taken any medication at this point. Her symptoms are worsening. She denies any urinary frequency, urgency, blood in her urine.   Review of Systems  Constitutional: Positive for fever, chills and fatigue.  HENT: Negative.   Eyes: Negative.   Cardiovascular: Negative.   Gastrointestinal: Negative.   Genitourinary: Positive for flank pain.  Musculoskeletal: Negative.   Skin: Negative.   Neurological: Negative.   Psychiatric/Behavioral: Negative.        Past Medical History  Diagnosis Date  . Anxiety   . Depression   . Asthma   . Diabetes mellitus     sees Dr. Talmage Nap  . Hyperlipidemia   . Peripheral neuropathy   . TIA (transient ischemic attack)     in 2007,2008, and 2011  . Cancer     breast, sees Dr. Darnelle Catalan   . Urosepsis 08-2009    with Klebsiella    History   Social History  . Marital Status: Married    Spouse Name: N/A    Number of Children: N/A  . Years of Education: N/A   Occupational History  . Not on file.   Social History Main Topics  . Smoking status: Former Games developer  . Smokeless tobacco: Never Used  . Alcohol Use: No  . Drug Use: No  . Sexually Active: Not on file   Other Topics Concern  . Not on file   Social History Narrative  . No narrative on file    Past Surgical History  Procedure Date  . Abdominal hysterectomy   . Foot surgery     plantar fasciitis  . Nerve graft 06-01-08    cadaver graft to right inferior alveolar nerve  in [place]   . Breast surgery 2009    left ductal carcinoma in situ    No family history on file.  No Known Allergies  Current Outpatient Prescriptions on  File Prior to Visit  Medication Sig Dispense Refill  . albuterol (PROAIR HFA) 108 (90 BASE) MCG/ACT inhaler Inhale 2 puffs into the lungs every 4 (four) hours as needed for wheezing or shortness of breath.  1 Inhaler  11  . aspirin 325 MG EC tablet Take 325 mg by mouth daily.        . furosemide (LASIX) 40 MG tablet Take 40 mg by mouth 2 (two) times daily as needed.       Marland Kitchen ibuprofen (ADVIL,MOTRIN) 800 MG tablet Take 800 mg by mouth every 6 (six) hours as needed.        . insulin lispro (HUMALOG) 100 UNIT/ML injection Inject 75 Units into the skin 3 (three) times daily before meals.        . insulin NPH (NOVOLIN N) 100 UNIT/ML injection Inject 75 Units into the skin 3 (three) times daily before meals.  10 mL  3  . ketoconazole (NIZORAL) 2 % cream Apply 2 % topically daily.        . metFORMIN (GLUCOPHAGE) 1000 MG tablet Take 1,000 mg by mouth daily with breakfast.        . oxyCODONE-acetaminophen (PERCOCET) 10-650 MG per tablet Take 1 tablet by  mouth every 6 (six) hours as needed.        . sertraline (ZOLOFT) 50 MG tablet TAKE 1 TABLET DAILY BY     MOUTH  90 tablet  3  . simvastatin (ZOCOR) 40 MG tablet TAKE 1 TABLET DAILY BY     MOUTH  90 tablet  3    Temp(Src) 99 F (37.2 C) (Oral)chart Objective:   Physical Exam  Constitutional: She is oriented to person, place, and time. She appears well-developed and well-nourished.  Neck: Normal range of motion. Neck supple.  Cardiovascular: Normal rate, regular rhythm and normal heart sounds.   Pulmonary/Chest: Effort normal.  Abdominal: Bowel sounds are normal.  Musculoskeletal: Normal range of motion.  Neurological: She is alert and oriented to person, place, and time.  Skin: Skin is warm and dry.  Psychiatric: She has a normal mood and affect.          Assessment & Plan:  Assessment: Low back pain, likely pyelonephritis  Plan: Urine culture sent. Will treat with Cipro 500 mg twice daily x7 days. Rest. Drink plenty of fluids. Call if  symptoms worsen or if. Recheck as scheduled, and when necessary.

## 2011-02-02 ENCOUNTER — Telehealth: Payer: Self-pay | Admitting: *Deleted

## 2011-02-02 MED ORDER — ALBUTEROL SULFATE (2.5 MG/3ML) 0.083% IN NEBU: 2.5000 mg | INHALATION_SOLUTION | RESPIRATORY_TRACT | Status: DC | PRN

## 2011-02-02 NOTE — Telephone Encounter (Signed)
Call in Albuterol 0.083% neb vials, use q 4 hours prn SOB, #50 with 11 rf

## 2011-02-02 NOTE — Telephone Encounter (Signed)
rx sent to pharmacy

## 2011-02-02 NOTE — Telephone Encounter (Signed)
Pt is asking for a nebulizer prescription,  She is using her inhaler, Mucinex, took Cipro, Still wheezing, SOB, and tightness in chest.  Using vaporizer.  Please return her call as she needs help before the weekend.

## 2011-02-02 NOTE — Telephone Encounter (Signed)
Pt is coming in to get a nebulizer. I need a order for the medications.

## 2011-02-09 ENCOUNTER — Telehealth: Payer: Self-pay | Admitting: Family Medicine

## 2011-02-09 MED ORDER — HYDROCODONE-HOMATROPINE 5-1.5 MG/5ML PO SYRP: 5.0000 mL | ORAL_SOLUTION | ORAL | Status: AC | PRN

## 2011-02-09 NOTE — Telephone Encounter (Signed)
Pt requested a refill for Hycodan syrup and Dr. Clent Ridges did approve, which I called in.

## 2011-02-12 ENCOUNTER — Telehealth (INDEPENDENT_AMBULATORY_CARE_PROVIDER_SITE_OTHER): Payer: Self-pay | Admitting: General Surgery

## 2011-02-12 NOTE — Telephone Encounter (Signed)
Patient called to schedule an appointment, she has a found a rt br mass, pt does have hx of br ca.  She called the br ctr to schedule a MGM, but they told she needed a doctor's order.  I have put her on a cancellation list because soonest available appt for Dr. Larkin Ina is 03/09/11.  She also wondered if it was possible to get the order for the [facility] before her appointment or if there was anyway she could be seen sooner, please call.

## 2011-02-13 ENCOUNTER — Telehealth (INDEPENDENT_AMBULATORY_CARE_PROVIDER_SITE_OTHER): Payer: Self-pay

## 2011-02-13 ENCOUNTER — Other Ambulatory Visit (INDEPENDENT_AMBULATORY_CARE_PROVIDER_SITE_OTHER): Payer: Self-pay | Admitting: General Surgery

## 2011-02-13 DIAGNOSIS — N6310 Unspecified lump in the right breast, unspecified quadrant: Secondary | ICD-10-CM

## 2011-02-13 DIAGNOSIS — Z853 Personal history of malignant neoplasm of breast: Secondary | ICD-10-CM

## 2011-02-13 NOTE — Telephone Encounter (Signed)
LM on pt's answering machine.  She is scheduled for a diagnostic bilateral mgm on 02/20/11, 2:40pm arrival at  [facility].  She will come in to see Dr. Donell Beers on 1/28 for consult and exam, and to discuss possible outcomes and surgery.

## 2011-02-19 ENCOUNTER — Encounter (INDEPENDENT_AMBULATORY_CARE_PROVIDER_SITE_OTHER): Payer: Self-pay | Admitting: General Surgery

## 2011-02-19 ENCOUNTER — Ambulatory Visit (INDEPENDENT_AMBULATORY_CARE_PROVIDER_SITE_OTHER): Payer: BC Managed Care – PPO | Admitting: General Surgery

## 2011-02-19 VITALS — BP 136/86 | HR 74 | Temp 98.3°F | Resp 18 | Ht 67.5 in | Wt 398.4 lb

## 2011-02-19 DIAGNOSIS — D0590 Unspecified type of carcinoma in situ of unspecified breast: Secondary | ICD-10-CM

## 2011-02-19 DIAGNOSIS — D0510 Intraductal carcinoma in situ of unspecified breast: Secondary | ICD-10-CM | POA: Insufficient documentation

## 2011-02-19 NOTE — Assessment & Plan Note (Signed)
New lesions on exam, bilateral.  Follow up after mammo/ultrasound to determine whether radiological biopsy vs MRI vs surgical biopsy should be next step.

## 2011-02-19 NOTE — Patient Instructions (Signed)
Get mammo/ultrasound tomorrow.  If positive, will schedule appropriate type of biopsy.  If negative, will need MRI.  Will discuss after we get results.

## 2011-02-19 NOTE — Progress Notes (Signed)
Chief Complaint  Patient presents with  . Follow-up    Breast mass    HISTORY: Pt is 46 year old female who underwent BCT in 2009 for L breast DCIS.  This was a palpable mass that did not show up on mammogram or ultrasound.  She has felt a new firm spot in the medial aspect of her left breast surgical site under the nipple and an asymmetric area on the right breast in the lower inner quadrant.  She does think the left nipple is sore and the left sided lesion is uncomfortable.  She does not think either of these areas has gotten significantly larger, just more well defined.  She presents for evaluation.    Past Medical History  Diagnosis Date  . Anxiety   . Depression   . Asthma   . Diabetes mellitus     sees Dr. Talmage Nap  . Hyperlipidemia   . TIA (transient ischemic attack)     in 2007,2008, and 2011  . Cancer     breast, sees Dr. Darnelle Catalan   . Urosepsis 08-2009    with Klebsiella  . Peripheral neuropathy     Past Surgical History  Procedure Date  . Foot surgery     plantar fasciitis  . Nerve graft 06-01-08    cadaver graft to right inferior alveolar nerve  in [place]   . Abdominal hysterectomy 08/2004  . Breast surgery 11/2007    left ductal carcinoma in situ    Current Outpatient Prescriptions  Medication Sig Dispense Refill  . metFORMIN (GLUCOPHAGE) 500 MG tablet Take 500 mg by mouth daily with breakfast.      . albuterol (PROAIR HFA) 108 (90 BASE) MCG/ACT inhaler Inhale 2 puffs into the lungs every 4 (four) hours as needed for wheezing or shortness of breath.  1 Inhaler  11  . albuterol (PROVENTIL) (2.5 MG/3ML) 0.083% nebulizer solution Take 3 mLs (2.5 mg total) by nebulization every 4 (four) hours as needed for wheezing.  50 mL  11  . aspirin 325 MG EC tablet Take 325 mg by mouth daily.        . furosemide (LASIX) 40 MG tablet Take 40 mg by mouth 2 (two) times daily as needed.       Marland Kitchen HYDROcodone-homatropine (HYCODAN) 5-1.5 MG/5ML syrup Take 5 mLs by mouth every 4 (four) hours  as needed for cough.  240 mL  0  . insulin lispro (HUMALOG) 100 UNIT/ML injection Inject 75 Units into the skin 3 (three) times daily before meals.        . insulin NPH (NOVOLIN N) 100 UNIT/ML injection Inject 75 Units into the skin 3 (three) times daily before meals.  10 mL  3  . sertraline (ZOLOFT) 50 MG tablet TAKE 1 TABLET DAILY BY     MOUTH  90 tablet  3  . simvastatin (ZOCOR) 40 MG tablet TAKE 1 TABLET DAILY BY     MOUTH  90 tablet  3     No Known Allergies   Family History  Problem Relation Age of Onset  . Cancer Mother     breast  . Cancer Father     lung  . Cancer Sister     colon  . Cancer Maternal Aunt     breast - both     History   Social History  . Marital Status: Married    Spouse Name: N/A    Number of Children: N/A  . Years of Education: N/A  Social History Main Topics  . Smoking status: Former Smoker    Quit date: 02/18/1997  . Smokeless tobacco: Never Used  . Alcohol Use: No  . Drug Use: No  . Sexually Active: None    REVIEW OF SYSTEMS - PERTINENT POSITIVES ONLY: 12 point review of systems negative other than HPI and PMH.  EXAM: Filed Vitals:   02/19/11 1033  BP: 136/86  Pulse: 74  Temp: 98.3 F (36.8 C)  Resp: 18    Gen:  No acute distress.  Well nourished and well groomed.   Neurological: Alert and oriented to person, place, and time. Coordination normal.  Head: Normocephalic and atraumatic.  Eyes: Conjunctivae are normal. Pupils are equal, round, and reactive to light. No scleral icterus.  Neck: Normal range of motion. Neck supple. No tracheal deviation or thyromegaly present.  Cardiovascular: Normal rate, regular rhythm, intact distal pulses. Respiratory: Effort normal.  No respiratory distress. No chest wall tenderness.  Breast:  L breast with 5 mm area around 6 o'clock at the areolar border that is firm and mobile.  R breast with skin dimpling and area of firmness around 4 mm at 4 o'clock.   GI: Soft. The abdomen is soft and  nontender.  There is no rebound and no guarding.  Musculoskeletal: Normal range of motion. Extremities are nontender.  Lymphadenopathy: No cervical, preauricular, postauricular or axillary adenopathy is present Skin: Skin is warm and dry. No rash noted. No diaphoresis. No erythema. No pallor. No clubbing, cyanosis, or edema.   Psychiatric: Normal mood and affect. Behavior is normal. Judgment and thought content normal.    RADIOLOGY RESULTS:  December 2011 mammogram.  IMPRESSION:  Dilated ducts in the 5:30 o'clock position of the left breast,  corresponding to the mass felt by the patient. No evidence of  malignancy. A follow up bilateral diagnostic mammogram is  recommended in one year. Also recommended is consideration of  biennial screening MR of the breasts.  BI-RADS CATEGORY 2: Benign finding(s).   ASSESSMENT AND PLAN: DCIS (ductal carcinoma in situ) of breast, left, treated with BCT 11/2007 New lesions on exam, bilateral.  Follow up after mammo/ultrasound to determine whether radiological biopsy vs MRI vs surgical biopsy should be next step.           Maudry Diego MD Surgical Oncology, General and Endocrine Surgery [facility]      Visit Diagnoses: 1. DCIS (ductal carcinoma in situ) of breast, left, treated with [facility] 11/2007     Primary Care Physician: Nelwyn Salisbury, MD, MD

## 2011-02-20 ENCOUNTER — Other Ambulatory Visit (INDEPENDENT_AMBULATORY_CARE_PROVIDER_SITE_OTHER): Payer: Self-pay | Admitting: General Surgery

## 2011-02-20 ENCOUNTER — Ambulatory Visit
Admission: RE | Admit: 2011-02-20 | Discharge: 2011-02-20 | Disposition: A | Discharge status: Home / Self Care | Payer: BC Managed Care – PPO | Source: Ambulatory Visit | Attending: General Surgery | Admitting: General Surgery

## 2011-02-20 DIAGNOSIS — N6310 Unspecified lump in the right breast, unspecified quadrant: Secondary | ICD-10-CM

## 2011-02-20 DIAGNOSIS — Z853 Personal history of malignant neoplasm of breast: Secondary | ICD-10-CM

## 2011-02-28 ENCOUNTER — Other Ambulatory Visit (INDEPENDENT_AMBULATORY_CARE_PROVIDER_SITE_OTHER): Payer: Self-pay | Admitting: General Surgery

## 2011-02-28 DIAGNOSIS — Z853 Personal history of malignant neoplasm of breast: Secondary | ICD-10-CM

## 2011-03-05 ENCOUNTER — Encounter (INDEPENDENT_AMBULATORY_CARE_PROVIDER_SITE_OTHER): Payer: BC Managed Care – PPO | Admitting: General Surgery

## 2011-03-09 ENCOUNTER — Encounter (INDEPENDENT_AMBULATORY_CARE_PROVIDER_SITE_OTHER): Payer: BC Managed Care – PPO | Admitting: General Surgery

## 2011-03-13 ENCOUNTER — Ambulatory Visit
Admission: RE | Admit: 2011-03-13 | Discharge: 2011-03-13 | Disposition: A | Discharge status: Home / Self Care | Payer: BC Managed Care – PPO | Source: Ambulatory Visit | Attending: General Surgery | Admitting: General Surgery

## 2011-03-13 DIAGNOSIS — Z853 Personal history of malignant neoplasm of breast: Secondary | ICD-10-CM

## 2011-03-13 MED ORDER — GADOBENATE DIMEGLUMINE 529 MG/ML IV SOLN
20.0000 mL | Freq: Once | INTRAVENOUS | Status: AC | PRN
  Administered 2011-03-13: 20 mL via INTRAVENOUS

## 2011-03-20 ENCOUNTER — Ambulatory Visit (INDEPENDENT_AMBULATORY_CARE_PROVIDER_SITE_OTHER): Payer: BC Managed Care – PPO | Admitting: General Surgery

## 2011-03-20 ENCOUNTER — Encounter (INDEPENDENT_AMBULATORY_CARE_PROVIDER_SITE_OTHER): Payer: Self-pay | Admitting: General Surgery

## 2011-03-20 VITALS — BP 150/98 | HR 72 | Temp 97.8°F | Resp 18 | Ht 67.5 in | Wt 398.6 lb

## 2011-03-20 DIAGNOSIS — D0510 Intraductal carcinoma in situ of unspecified breast: Secondary | ICD-10-CM

## 2011-03-20 DIAGNOSIS — D0590 Unspecified type of carcinoma in situ of unspecified breast: Secondary | ICD-10-CM

## 2011-03-20 NOTE — Assessment & Plan Note (Signed)
Plan to re-examine breasts in 3 months. No evidence of recurrent malignancy.

## 2011-03-20 NOTE — Patient Instructions (Signed)
Follow up with me in 3 months for recheck.

## 2011-03-20 NOTE — Progress Notes (Signed)
HISTORY: Pt had areas of breasts on both sides that were palpable and worrying to her.  She underwent imaging, and the areas of concern appear to be fat necrosis on the right, and just the edge of the lumpectomy cavity on the left.  She is still having some left sided nipple pain.     PERTINENT REVIEW OF SYSTEMS: Otherwise negative.     EXAM: Head: Normocephalic and atraumatic.  Eyes:  Conjunctivae are normal. Pupils are equal, round, and reactive to light. No scleral icterus.  Neck:  Bilateral breasts without concerning masses.  She has a small area at 6 oclock just under the nipple on the right that feels like an area of thickening, but not a discrete mass.  On the left, the medial aspect of the lumpectomy cavity feels like a drop off, but not a true mass.    Resp: No respiratory distress, normal effort. Neurological: Alert and oriented to person, place, and time. Coordination normal.  Skin: Skin is warm and dry. No rash noted. No diaphoretic. No erythema. No pallor.  Psychiatric: Normal mood and affect. Normal behavior. Judgment and thought content normal.      ASSESSMENT AND PLAN:   DCIS (ductal carcinoma in situ) of breast, left, treated with BCT 11/2007 Plan to re-examine breasts in 3 months. No evidence of recurrent malignancy.       Maudry Diego, MD Surgical Oncology, General & Endocrine Surgery [facility]  Nelwyn Salisbury, MD, MD Nelwyn Salisbury, MD

## 2011-04-18 ENCOUNTER — Other Ambulatory Visit: Payer: Self-pay | Admitting: Specialist

## 2011-04-18 DIAGNOSIS — M25519 Pain in unspecified shoulder: Secondary | ICD-10-CM

## 2011-04-23 ENCOUNTER — Ambulatory Visit
Admission: RE | Admit: 2011-04-23 | Discharge: 2011-04-23 | Disposition: A | Discharge status: Home / Self Care | Payer: BC Managed Care – PPO | Source: Ambulatory Visit | Attending: Specialist | Admitting: Specialist

## 2011-04-23 DIAGNOSIS — M25519 Pain in unspecified shoulder: Secondary | ICD-10-CM

## 2011-04-23 MED ORDER — IOHEXOL 180 MG/ML  SOLN
1.0000 mL | Freq: Once | INTRAMUSCULAR | Status: AC | PRN
  Administered 2011-04-23: 1 mL via INTRA_ARTICULAR

## 2011-04-23 MED ORDER — METHYLPREDNISOLONE ACETATE 40 MG/ML INJ SUSP (RADIOLOG
120.0000 mg | Freq: Once | INTRAMUSCULAR | Status: AC
  Administered 2011-04-23: 120 mg via INTRA_ARTICULAR

## 2011-06-07 ENCOUNTER — Other Ambulatory Visit: Payer: Self-pay | Admitting: Specialist

## 2011-06-07 DIAGNOSIS — M25512 Pain in left shoulder: Secondary | ICD-10-CM

## 2011-06-08 ENCOUNTER — Ambulatory Visit (INDEPENDENT_AMBULATORY_CARE_PROVIDER_SITE_OTHER): Payer: BC Managed Care – PPO | Admitting: General Surgery

## 2011-06-08 ENCOUNTER — Encounter (INDEPENDENT_AMBULATORY_CARE_PROVIDER_SITE_OTHER): Payer: Self-pay | Admitting: General Surgery

## 2011-06-08 VITALS — BP 117/77 | HR 89 | Temp 96.5°F | Ht 68.0 in | Wt 394.0 lb

## 2011-06-08 DIAGNOSIS — D0510 Intraductal carcinoma in situ of unspecified breast: Secondary | ICD-10-CM

## 2011-06-08 DIAGNOSIS — D0590 Unspecified type of carcinoma in situ of unspecified breast: Secondary | ICD-10-CM

## 2011-06-08 NOTE — Patient Instructions (Signed)
IF YOU ARE TAKING ASPIRIN, COUMADIN/WARFARIN, PLAVIX, OR OTHER BLOOD THINNER, PLEASE LET US KNOW IMMEDIATELY.  WE WILL NEED TO DISCUSS WITH THE PRESCRIBING PROVIDER IF THESE ARE SAFE TO STOP. IF THESE ARE NOT STOPPED AT THE APPROPRIATE TIME, THIS WILL RESULT IN A DELAY FOR YOUR SURGERY.  DO NOT TAKE THESE MEDICATIONS OR IBUPROFEN/NAPROXEN WITHIN A WEEK BEFORE SURGERY.   The main risks of surgery are bleeding, infection, damage to other structures, and seroma (accumulation of fluid) under the incision site(s).    These complications may lead to additional procedures such as drainage of seroma/infection.  If cancer is found, you may need other surgeries to obtain negative margins or to take more lymph nodes.   Most women do accumulate fluid in the breast cavity where the specimen was removed. We do not always have to drain this fluid.  If your breast is very tense, painful, or red, then we may need to numb the skin and use a needle to aspirate the fluid.  We do provide patients with a Breast Binder.  The purpose of this is to avoid the use of tape on the sensitive tissue of the breast and to provide some compression to minimize the risk of seroma.  If the binder is uncomfortable, you may find that a tank top with a built-in shelf bra or a loose sports bra works better for you.  I recommend wearing this around the clock for the first 1-2 weeks except in the shower.    You may remove your dressings and may shower 48 hours after surgery IF YOU DO NOT HAVE A DRAIN.    Many patients have some constipation in the week after surgery due to the narcotics and anesthesia.  You may need over the counter stool softeners or laxatives if you experience difficulty having bowel movements.    If the following occur, call our office at [phone]: If you have a fever over 101 or pain that is severe despite narcotics. If you have redness or drainage at the wound. If you develop persistent nausea or vomiting.  I  will follow you back up in 1-4 weeks.    Please submit any paperwork about time off work/insurance forms to the front desk.   

## 2011-06-08 NOTE — Progress Notes (Signed)
HISTORY: Area on right breast appears to be fat necrosis, but is getting larger, and patient sees deformation of breast contour when she lifts her right breast.  She is increasingly anxious about this area, understandably, since her original DCIS was followed for quite a while.  Marland Kitchen     PERTINENT REVIEW OF SYSTEMS: Otherwise negative.     EXAM: Head: Normocephalic and atraumatic.  Eyes:  Conjunctivae are normal. Pupils are equal, round, and reactive to light. No scleral icterus.  Neck:  Bilateral breasts without concerning masses.  She has a small area at 6 oclock just under the nipple on the right that feels like a 1 cm mass on the inframammary fold.  On the left, the medial aspect of the lumpectomy cavity feels like a drop off, but not a true mass.   This in unchanged since prior exam.   Resp: No respiratory distress, normal effort. Neurological: Alert and oriented to person, place, and time. Coordination normal.  Skin: Skin is warm and dry. No rash noted. No diaphoretic. No erythema. No pallor.  Psychiatric: Normal mood and affect. Normal behavior. Judgment and thought content normal.      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 Do not have tissue diagnosis.   Will plan excisional biopsy.  The surgical procedure was described to the patient.  I discussed the incision type and location and whether we would need radiology involved on the day of surgery with a wire marker and/or sentinel node.      The risks and benefits of the procedure were described to the patient and he/she wishes to proceed.    We discussed the risks bleeding, infection, damage to other structures, need for further procedures/surgeries.  We discussed the risk of seroma.  The patient was advised if the area in the breast in cancer, we may need to go back to surgery for additional tissue to obtain negative margins or  for a lymph node biopsy. The patient was advised that these are the most common complications, but that others can occur as well.  They were advised against taking aspirin or other anti-inflammatory agents/blood thinners the week before surgery.       Maudry Diego, MD Surgical Oncology, General & Endocrine Surgery [facility]  Nelwyn Salisbury, MD, MD Nelwyn Salisbury, MD

## 2011-06-08 NOTE — Assessment & Plan Note (Addendum)
Area of probable fat necrosis getting larger. Do not have tissue diagnosis.   Will plan excisional biopsy.  The surgical procedure was described to the patient.  I discussed the incision type and location and whether we would need radiology involved on the day of surgery with a wire marker and/or sentinel node.      The risks and benefits of the procedure were described to the patient and he/she wishes to proceed.    We discussed the risks bleeding, infection, damage to other structures, need for further procedures/surgeries.  We discussed the risk of seroma.  The patient was advised if the area in the breast in cancer, we may need to go back to surgery for additional tissue to obtain negative margins or for a lymph node biopsy. The patient was advised that these are the most common complications, but that others can occur as well.  They were advised against taking aspirin or other anti-inflammatory agents/blood thinners the week before surgery.

## 2011-06-14 ENCOUNTER — Encounter (HOSPITAL_COMMUNITY): Payer: Self-pay | Admitting: Pharmacy Technician

## 2011-06-14 ENCOUNTER — Telehealth (INDEPENDENT_AMBULATORY_CARE_PROVIDER_SITE_OTHER): Payer: Self-pay | Admitting: General Surgery

## 2011-06-14 NOTE — Telephone Encounter (Signed)
Cheyenne Guzman, [facility] preop, calling to have orders entered, please, for surgery on 06/22/11.

## 2011-06-15 ENCOUNTER — Inpatient Hospital Stay (HOSPITAL_COMMUNITY): Admission: RE | Admit: 2011-06-15 | Discharge: 2011-06-15 | Payer: BC Managed Care – PPO | Source: Ambulatory Visit

## 2011-06-15 ENCOUNTER — Encounter (HOSPITAL_COMMUNITY): Payer: Self-pay

## 2011-06-15 ENCOUNTER — Telehealth (INDEPENDENT_AMBULATORY_CARE_PROVIDER_SITE_OTHER): Payer: Self-pay | Admitting: General Surgery

## 2011-06-15 HISTORY — DX: Unspecified osteoarthritis, unspecified site: M19.90

## 2011-06-15 HISTORY — DX: Renal tubulo-interstitial disease, unspecified: N15.9

## 2011-06-15 NOTE — Telephone Encounter (Signed)
Cheyenne Guzman called stating that surgery orders for this patient are needed. Surgery scheduled for 06/19/11.

## 2011-06-15 NOTE — Pre-Procedure Instructions (Signed)
20 Cheyenne Guzman  06/15/2011   Your procedure is scheduled on:  06/22/2011  Report to [facility] at 5:30 AM.  Call this number if you have problems the morning of surgery: [phone]   Remember:   Do not eat food:After Midnight.  May have clear liquids: up to 4 Hours before arrival.  Clear liquids include soda, tea, black coffee, apple or grape juice, broth.  Take these medicines the morning of surgery with A SIP OF WATER: albuterol inhaler/nebulizer as needed    Do not wear jewelry, make-up or nail polish.  Do not wear lotions, powders, or perfumes. You may wear deodorant.  Do not shave 48 hours prior to surgery. Men may shave face and neck.  Do not bring valuables to the hospital.  Contacts, dentures or bridgework may not be worn into surgery.  Leave suitcase in the car. After surgery it may be brought to your room.  For patients admitted to the hospital, checkout time is 11:00 AM the day of discharge.   Patients discharged the day of surgery will not be allowed to drive home.  Name and phone number of your driver: with family  Special Instructions: CHG Shower Use Special Wash: 1/2 bottle night before surgery and 1/2 bottle morning of surgery.   Please read over the following fact sheets that you were given: Pain Booklet, Coughing and Deep Breathing, MRSA Information and Surgical Site Infection Prevention

## 2011-06-15 NOTE — Progress Notes (Addendum)
Pt. Had echo 09/2009- doesn't see cardiologist, performed during septic event.  Sees Dr. Talmage Nap, [facility].

## 2011-06-19 ENCOUNTER — Other Ambulatory Visit (INDEPENDENT_AMBULATORY_CARE_PROVIDER_SITE_OTHER): Payer: Self-pay | Admitting: General Surgery

## 2011-06-19 ENCOUNTER — Encounter (HOSPITAL_COMMUNITY)
Admission: RE | Admit: 2011-06-19 | Discharge: 2011-06-19 | Disposition: A | Discharge status: Home / Self Care | Payer: BC Managed Care – PPO | Source: Ambulatory Visit | Attending: General Surgery | Admitting: General Surgery

## 2011-06-19 LAB — CBC
HCT: 42.7 % (ref 36.0–46.0)
Hemoglobin: 13.7 g/dL (ref 12.0–15.0)
MCV: 83.1 fL (ref 78.0–100.0)
RBC: 5.14 MIL/uL — ABNORMAL HIGH (ref 3.87–5.11)
WBC: 7.8 10*3/uL (ref 4.0–10.5)

## 2011-06-19 LAB — BASIC METABOLIC PANEL
CO2: 27 mEq/L (ref 19–32)
Chloride: 103 mEq/L (ref 96–112)
Creatinine, Ser: 0.87 mg/dL (ref 0.50–1.10)
Potassium: 3.9 mEq/L (ref 3.5–5.1)
Sodium: 140 mEq/L (ref 135–145)

## 2011-06-20 ENCOUNTER — Ambulatory Visit
Admission: RE | Admit: 2011-06-20 | Discharge: 2011-06-20 | Disposition: A | Discharge status: Home / Self Care | Payer: BC Managed Care – PPO | Source: Ambulatory Visit | Attending: Specialist | Admitting: Specialist

## 2011-06-20 DIAGNOSIS — M25512 Pain in left shoulder: Secondary | ICD-10-CM

## 2011-06-20 MED ORDER — IOHEXOL 180 MG/ML  SOLN
16.0000 mL | Freq: Once | INTRAMUSCULAR | Status: AC | PRN
  Administered 2011-06-20: 16 mL via INTRA_ARTICULAR

## 2011-06-21 MED ORDER — CEFAZOLIN SODIUM-DEXTROSE 2-3 GM-% IV SOLR
2.0000 g | INTRAVENOUS | Status: AC
  Administered 2011-06-22: 2 g via INTRAVENOUS
  Filled 2011-06-21: qty 50

## 2011-06-21 MED ORDER — CHLORHEXIDINE GLUCONATE 4 % EX LIQD: 1.0000 "application " | Freq: Once | CUTANEOUS | Status: DC

## 2011-06-22 ENCOUNTER — Encounter (HOSPITAL_COMMUNITY): Payer: Self-pay | Admitting: Anesthesiology

## 2011-06-22 ENCOUNTER — Encounter (HOSPITAL_COMMUNITY)
Admission: RE | Disposition: A | Discharge status: Home / Self Care | Payer: Self-pay | Source: Ambulatory Visit | Attending: General Surgery

## 2011-06-22 ENCOUNTER — Encounter (HOSPITAL_COMMUNITY): Payer: Self-pay | Admitting: General Surgery

## 2011-06-22 ENCOUNTER — Ambulatory Visit (HOSPITAL_COMMUNITY): Payer: BC Managed Care – PPO | Admitting: Anesthesiology

## 2011-06-22 ENCOUNTER — Ambulatory Visit (HOSPITAL_COMMUNITY)
Admission: RE | Admit: 2011-06-22 | Discharge: 2011-06-22 | Disposition: A | Discharge status: Home / Self Care | Payer: BC Managed Care – PPO | Source: Ambulatory Visit | Attending: General Surgery | Admitting: General Surgery

## 2011-06-22 DIAGNOSIS — F411 Generalized anxiety disorder: Secondary | ICD-10-CM | POA: Insufficient documentation

## 2011-06-22 DIAGNOSIS — Z87891 Personal history of nicotine dependence: Secondary | ICD-10-CM | POA: Insufficient documentation

## 2011-06-22 DIAGNOSIS — F3289 Other specified depressive episodes: Secondary | ICD-10-CM | POA: Insufficient documentation

## 2011-06-22 DIAGNOSIS — Z01812 Encounter for preprocedural laboratory examination: Secondary | ICD-10-CM | POA: Insufficient documentation

## 2011-06-22 DIAGNOSIS — Z8673 Personal history of transient ischemic attack (TIA), and cerebral infarction without residual deficits: Secondary | ICD-10-CM | POA: Insufficient documentation

## 2011-06-22 DIAGNOSIS — E119 Type 2 diabetes mellitus without complications: Secondary | ICD-10-CM | POA: Insufficient documentation

## 2011-06-22 DIAGNOSIS — J45909 Unspecified asthma, uncomplicated: Secondary | ICD-10-CM | POA: Insufficient documentation

## 2011-06-22 DIAGNOSIS — N6019 Diffuse cystic mastopathy of unspecified breast: Secondary | ICD-10-CM

## 2011-06-22 DIAGNOSIS — F329 Major depressive disorder, single episode, unspecified: Secondary | ICD-10-CM | POA: Insufficient documentation

## 2011-06-22 DIAGNOSIS — N6089 Other benign mammary dysplasias of unspecified breast: Secondary | ICD-10-CM | POA: Insufficient documentation

## 2011-06-22 HISTORY — PX: BREAST BIOPSY: SHX20

## 2011-06-22 LAB — GLUCOSE, CAPILLARY
Glucose-Capillary: 110 mg/dL — ABNORMAL HIGH (ref 70–99)
Glucose-Capillary: 107 mg/dL — ABNORMAL HIGH (ref 70–99)

## 2011-06-22 SURGERY — BREAST BIOPSY
Anesthesia: General | Site: Breast | Laterality: Right | Wound class: Clean

## 2011-06-22 MED ORDER — SUCCINYLCHOLINE CHLORIDE 20 MG/ML IJ SOLN
INTRAMUSCULAR | Status: DC | PRN
  Administered 2011-06-22: 140 mg via INTRAVENOUS

## 2011-06-22 MED ORDER — ONDANSETRON HCL 4 MG/2ML IJ SOLN
INTRAMUSCULAR | Status: DC | PRN
  Administered 2011-06-22: 4 mg via INTRAVENOUS

## 2011-06-22 MED ORDER — BUPIVACAINE-EPINEPHRINE 0.5% -1:200000 IJ SOLN
INTRAMUSCULAR | Status: DC | PRN
  Administered 2011-06-22: 30 mL

## 2011-06-22 MED ORDER — 0.9 % SODIUM CHLORIDE (POUR BTL) OPTIME
TOPICAL | Status: DC | PRN
  Administered 2011-06-22: 1000 mL

## 2011-06-22 MED ORDER — OXYCODONE-ACETAMINOPHEN 5-325 MG PO TABS: 1.0000 | ORAL_TABLET | ORAL | Status: AC | PRN

## 2011-06-22 MED ORDER — LIDOCAINE HCL (CARDIAC) 20 MG/ML IV SOLN
INTRAVENOUS | Status: DC | PRN
  Administered 2011-06-22: 80 mg via INTRAVENOUS

## 2011-06-22 MED ORDER — LIDOCAINE HCL (PF) 1 % IJ SOLN
INTRAMUSCULAR | Status: DC | PRN
  Administered 2011-06-22: 30 mL

## 2011-06-22 MED ORDER — PROPOFOL 10 MG/ML IV EMUL
INTRAVENOUS | Status: DC | PRN
  Administered 2011-06-22: 50 mg via INTRAVENOUS
  Administered 2011-06-22: 200 mg via INTRAVENOUS

## 2011-06-22 MED ORDER — LACTATED RINGERS IV SOLN
INTRAVENOUS | Status: DC | PRN
  Administered 2011-06-22 (×2): via INTRAVENOUS

## 2011-06-22 MED ORDER — OXYCODONE HCL 5 MG PO TABS
5.0000 mg | ORAL_TABLET | ORAL | Status: DC | PRN
  Administered 2011-06-22: 5 mg via ORAL

## 2011-06-22 MED ORDER — ONDANSETRON HCL 4 MG/2ML IJ SOLN: 4.0000 mg | Freq: Four times a day (QID) | INTRAMUSCULAR | Status: DC | PRN

## 2011-06-22 MED ORDER — OXYCODONE HCL 5 MG PO TABS
ORAL_TABLET | ORAL | Status: AC
  Administered 2011-06-22: 5 mg via ORAL
  Filled 2011-06-22: qty 1

## 2011-06-22 MED ORDER — FENTANYL CITRATE 0.05 MG/ML IJ SOLN
INTRAMUSCULAR | Status: DC | PRN
  Administered 2011-06-22: 50 ug via INTRAVENOUS
  Administered 2011-06-22: 100 ug via INTRAVENOUS

## 2011-06-22 MED ORDER — MIDAZOLAM HCL 5 MG/5ML IJ SOLN
INTRAMUSCULAR | Status: DC | PRN
  Administered 2011-06-22: 2 mg via INTRAVENOUS

## 2011-06-22 MED ORDER — HYDROMORPHONE HCL PF 1 MG/ML IJ SOLN
0.2500 mg | INTRAMUSCULAR | Status: DC | PRN
  Administered 2011-06-22 (×2): 0.5 mg via INTRAVENOUS

## 2011-06-22 SURGICAL SUPPLY — 51 items
APPLICATOR COTTON TIP 6IN STRL (MISCELLANEOUS) ×3 IMPLANT
BINDER BREAST LRG (GAUZE/BANDAGES/DRESSINGS) IMPLANT
BINDER BREAST MEDIUM (GAUZE/BANDAGES/DRESSINGS) IMPLANT
BINDER BREAST XLRG (GAUZE/BANDAGES/DRESSINGS) IMPLANT
BINDER BREAST XXLRG (GAUZE/BANDAGES/DRESSINGS) ×3 IMPLANT
BLADE HEX COATED 2.75 (ELECTRODE) ×3 IMPLANT
BLADE SURG 15 STRL LF DISP TIS (BLADE) ×2 IMPLANT
BLADE SURG 15 STRL SS (BLADE) ×1
CANISTER SUCTION 1200CC (MISCELLANEOUS) ×3 IMPLANT
CHLORAPREP W/TINT 26ML (MISCELLANEOUS) ×3 IMPLANT
CLIP TI MEDIUM 6 (CLIP) ×3 IMPLANT
CLIP TI WIDE RED SMALL 6 (CLIP) ×3 IMPLANT
CLOTH BEACON ORANGE TIMEOUT ST (SAFETY) ×3 IMPLANT
CLSR STERI-STRIP ANTIMIC 1/2X4 (GAUZE/BANDAGES/DRESSINGS) ×3 IMPLANT
CONT SPEC STER OR (MISCELLANEOUS) ×6 IMPLANT
COVER MAYO STAND STRL (DRAPES) ×3 IMPLANT
COVER TABLE BACK 60X90 (DRAPES) ×3 IMPLANT
DECANTER SPIKE VIAL GLASS SM (MISCELLANEOUS) ×3 IMPLANT
DERMABOND ADVANCED (GAUZE/BANDAGES/DRESSINGS) ×1
DERMABOND ADVANCED .7 DNX12 (GAUZE/BANDAGES/DRESSINGS) ×2 IMPLANT
DEVICE DUBIN W/COMP PLATE 8390 (MISCELLANEOUS) IMPLANT
DRAPE LAPAROTOMY TRNSV 102X78 (DRAPE) ×3 IMPLANT
DRAPE UTILITY XL STRL (DRAPES) ×3 IMPLANT
ELECT REM PT RETURN 9FT ADLT (ELECTROSURGICAL) ×3
ELECTRODE REM PT RTRN 9FT ADLT (ELECTROSURGICAL) ×2 IMPLANT
GLOVE BIO SURGEON STRL SZ 6 (GLOVE) ×3 IMPLANT
GLOVE BIO SURGEON STRL SZ7 (GLOVE) ×6 IMPLANT
GLOVE BIOGEL PI IND STRL 6.5 (GLOVE) ×2 IMPLANT
GLOVE BIOGEL PI IND STRL 7.0 (GLOVE) ×2 IMPLANT
GLOVE BIOGEL PI INDICATOR 6.5 (GLOVE) ×1
GLOVE BIOGEL PI INDICATOR 7.0 (GLOVE) ×1
GLOVE EUDERMIC 7 POWDERFREE (GLOVE) IMPLANT
GOWN PREVENTION PLUS XLARGE (GOWN DISPOSABLE) ×3 IMPLANT
KIT BASIN OR (CUSTOM PROCEDURE TRAY) ×3 IMPLANT
KIT MARKER MARGIN INK (KITS) IMPLANT
NEEDLE HYPO 25X1 1.5 SAFETY (NEEDLE) ×3 IMPLANT
NS IRRIG 1000ML POUR BTL (IV SOLUTION) ×3 IMPLANT
PENCIL BUTTON HOLSTER BLD 10FT (ELECTRODE) ×3 IMPLANT
SPONGE GAUZE 4X4 12PLY (GAUZE/BANDAGES/DRESSINGS) ×3 IMPLANT
SPONGE INTESTINAL PEANUT (DISPOSABLE) IMPLANT
SPONGE LAP 4X18 X RAY DECT (DISPOSABLE) ×3 IMPLANT
STAPLER VISISTAT 35W (STAPLE) ×3 IMPLANT
SUT MNCRL AB 4-0 PS2 18 (SUTURE) ×3 IMPLANT
SUT SILK 0 TIES 10X30 (SUTURE) IMPLANT
SUT VIC AB 3-0 SH 8-18 (SUTURE) ×3 IMPLANT
SYR BULB 3OZ (MISCELLANEOUS) ×3 IMPLANT
SYR CONTROL 10ML LL (SYRINGE) ×3 IMPLANT
TOWEL OR NON WOVEN STRL DISP B (DISPOSABLE) ×3 IMPLANT
TUBE CONNECTING 12X1/4 (SUCTIONS) ×3 IMPLANT
WATER STERILE IRR 1000ML POUR (IV SOLUTION) IMPLANT
YANKAUER SUCT BULB TIP NO VENT (SUCTIONS) ×3 IMPLANT

## 2011-06-22 NOTE — Discharge Instructions (Signed)
[facility] °Office Phone Number [phone] ° °BREAST BIOPSY/ PARTIAL MASTECTOMY: POST OP INSTRUCTIONS ° °Always review your discharge instruction sheet given to you by the facility where your surgery was performed. ° °IF YOU HAVE DISABILITY OR FAMILY LEAVE FORMS, YOU MUST BRING THEM TO THE OFFICE FOR PROCESSING.  DO NOT GIVE THEM TO YOUR DOCTOR. ° °1. A prescription for pain medication may be given to you upon discharge.  Take your pain medication as prescribed, if needed.  If narcotic pain medicine is not needed, then you may take acetaminophen (Tylenol) or ibuprofen (Advil) as needed. °2. Take your usually prescribed medications unless otherwise directed °3. If you need a refill on your pain medication, please contact your pharmacy.  They will contact our office to request authorization.  Prescriptions will not be filled after 5pm or on week-ends. °4. You should eat very light the first 24 hours after surgery, such as soup, crackers, pudding, etc.  Resume your normal diet the day after surgery. °5. Most patients will experience some swelling and bruising in the breast.  Ice packs and a good support bra will help.  Swelling and bruising can take several days to resolve.  °6. It is common to experience some constipation if taking pain medication after surgery.  Increasing fluid intake and taking a stool softener will usually help or prevent this problem from occurring.  A mild laxative (Milk of Magnesia or Miralax) should be taken according to package directions if there are no bowel movements after 48 hours. °7. Unless discharge instructions indicate otherwise, you may remove your bandages 48 hours after surgery, and you may shower at that time.  You may have steri-strips (small skin tapes) in place directly over the incision.  These strips should be left on the skin for 7-10 days.   Any sutures or staples will be removed at the office during your follow-up visit. °8. ACTIVITIES:  You may resume  regular daily activities (gradually increasing) beginning the next day.  Wearing a good support bra or sports bra (or the breast binder) minimizes pain and swelling.  You may have sexual intercourse when it is comfortable. °a. You may drive when you no longer are taking prescription pain medication, you can comfortably wear a seatbelt, and you can safely maneuver your car and apply brakes. °b. RETURN TO WORK:  __________1 week_______________ °9. You should see your doctor in the office for a follow-up appointment approximately two weeks after your surgery.  Your doctor’s nurse will typically make your follow-up appointment when she calls you with your pathology report.  Expect your pathology report 2-3 business days after your surgery.  You may call to check if you do not hear from us after three days. ° ° °WHEN TO CALL YOUR DOCTOR: °1. Fever over 101.0 °2. Nausea and/or vomiting. °3. Extreme swelling or bruising. °4. Continued bleeding from incision. °5. Increased pain, redness, or drainage from the incision. ° °The clinic staff is available to answer your questions during regular business hours.  Please don’t hesitate to call and ask to speak to one of the nurses for clinical concerns.  If you have a medical emergency, go to the nearest emergency room or call 911.  A surgeon from [facility] is always on call at the hospital. ° °For further questions, please visit [email or web address]  ° °

## 2011-06-22 NOTE — Anesthesia Preprocedure Evaluation (Addendum)
Anesthesia Evaluation  Patient identified by MRN, date of birth, ID band Patient awake    Reviewed: Allergy & Precautions, H&P , NPO status , Patient's Chart, lab work & pertinent test results  Airway Mallampati: I  Neck ROM: full    Dental  (+) Teeth Intact   Pulmonary asthma , former smoker breath sounds clear to auscultation        Cardiovascular     Neuro/Psych PSYCHIATRIC DISORDERS Anxiety Depression TIA Neuromuscular disease CVA    GI/Hepatic   Endo/Other  Diabetes mellitus-  Renal/GU      Musculoskeletal   Abdominal   Peds  Hematology   Anesthesia Other Findings   Reproductive/Obstetrics                          Anesthesia Physical Anesthesia Plan  ASA: III  Anesthesia Plan: General   Post-op Pain Management:    Induction: Intravenous  Airway Management Planned: Oral ETT  Additional Equipment:   Intra-op Plan:   Post-operative Plan: Extubation in OR  Informed Consent: I have reviewed the patients History and Physical, chart, labs and discussed the procedure including the risks, benefits and alternatives for the proposed anesthesia with the patient or authorized representative who has indicated his/her understanding and acceptance.     Plan Discussed with: CRNA and Surgeon  Anesthesia Plan Comments:         Anesthesia Quick Evaluation

## 2011-06-22 NOTE — Transfer of Care (Signed)
Immediate Anesthesia Transfer of Care Note  Patient: Cheyenne Guzman  Procedure(s) Performed: Procedure(s) (LRB): BREAST BIOPSY (Right)  Patient Location: PACU  Anesthesia Type: General  Level of Consciousness: awake, alert  and oriented  Airway & Oxygen Therapy: Patient Spontanous Breathing and Patient connected to face mask oxygen  Post-op Assessment: Report given to PACU RN, Post -op Vital signs reviewed and stable and Patient moving all extremities X 4  Post vital signs: Reviewed and stable  Complications: No apparent anesthesia complications

## 2011-06-22 NOTE — Anesthesia Procedure Notes (Signed)
Procedure Name: Intubation Date/Time: 06/22/2011 8:33 AM Performed by: Quentin Ore Pre-anesthesia Checklist: Patient identified, Emergency Drugs available, Suction available, Patient being monitored and Timeout performed Patient Re-evaluated:Patient Re-evaluated prior to inductionOxygen Delivery Method: Circle system utilized Preoxygenation: Pre-oxygenation with 100% oxygen Intubation Type: IV induction Ventilation: Mask ventilation without difficulty and Oral airway inserted - appropriate to patient size Laryngoscope Size: Mac and 4 Grade View: Grade II Tube size: 7.5 mm Number of attempts: 1 Airway Equipment and Method: Stylet Placement Confirmation: ETT inserted through vocal cords under direct vision,  positive ETCO2 and breath sounds checked- equal and bilateral Secured at: 22 cm Tube secured with: Tape Dental Injury: Teeth and Oropharynx as per pre-operative assessment

## 2011-06-22 NOTE — Preoperative (Signed)
Beta Blockers   Reason not to administer Beta Blockers:Not Applicable 

## 2011-06-22 NOTE — Op Note (Signed)
Excisional Breast Biopsy  Indications: This patient presents with history of right breast mass and history of left breast DCIS that was not demonstrated on imaging.  Pre-operative Diagnosis: right breast mass x 1  Post-operative Diagnosis: right breast mass x 2  Surgeon: AVWUJW,JXBJY   Anesthesia: General endotracheal anesthesia and Local anesthesia 1% plain lidocaine, 0.5% bupivacaine, with epinephrine  ASA Class: 3  Procedure Details  The patient was seen in the Holding Room. The risks, benefits, complications, treatment options, and expected outcomes were discussed with the patient. The possibilities of reaction to medication, pulmonary aspiration, bleeding, infection, the need for additional procedures, failure to diagnose a condition, and creating a complication requiring transfusion or operation were discussed with the patient. The patient concurred with the proposed plan, giving informed consent.  The site of surgery properly noted/marked. The patient was taken to Operating Room # 17, identified, and the procedure verified as Breast Excisional Biopsy. A Time Out was held and the above information confirmed.  After induction of anesthesia, the right  breast and chest were prepped and draped in standard fashion. The lumpectomy was performed by creating an oblique incision over the lower inner quadrant of the breast.  Dissection was carried down along the skin inferiorly.  The initial mass was at 4 o'clock at the inframammary fold.  This was elevated with an Allis clamp and taken with the cautery.  The patient had a second area that she was concerned about that was in the subareolar location at 4 o'clock.  The was excised with Metzenbaum scissors.  The masses appeared to be lipomas or normal breast tissue. Hemostasis was achieved with cautery.  The wound was irrigated and closed with a 3-0 Vicryl interrupted deep dermal stitch and a 4-0 Monocryl subcuticular closure in layers.    Sterile  dressings were applied. At the end of the operation, all sponge, instrument, and needle counts were correct.  Findings: Areas of concern appeared to be lipomas  Estimated Blood Loss:  Minimal  Specimens: 2 right breast masses to pathology, described above.         Complications:  None; patient tolerated the procedure well.         Disposition: PACU - hemodynamically stable.         Condition: stable

## 2011-06-22 NOTE — Interval H&P Note (Signed)
History and Physical Interval Note:  06/22/2011 8:19 AM  Cheyenne Guzman  has presented today for surgery, with the diagnosis of Right breast mass  The various methods of treatment have been discussed with the patient and family. After consideration of risks, benefits and other options for treatment, the patient has consented to  Procedure(s) (LRB): EXCISIONAL BREAST BIOPSY (Right) as a surgical intervention .  The patients' history has been reviewed, patient examined, no change in status, stable for surgery.  I have reviewed the patients' chart and labs.  Questions were answered to the patient's satisfaction.     [name]

## 2011-06-22 NOTE — H&P (View-Only) (Signed)
HISTORY: Area on right breast appears to be fat necrosis, but is getting larger, and patient sees deformation of breast contour when she lifts her right breast.  She is increasingly anxious about this area, understandably, since her original DCIS was followed for quite a while.  .     PERTINENT REVIEW OF SYSTEMS: Otherwise negative.     EXAM: Head: Normocephalic and atraumatic.  Eyes:  Conjunctivae are normal. Pupils are equal, round, and reactive to light. No scleral icterus.  Neck:  Bilateral breasts without concerning masses.  She has a small area at 6 oclock just under the nipple on the right that feels like a 1 cm mass on the inframammary fold.  On the left, the medial aspect of the lumpectomy cavity feels like a drop off, but not a true mass.   This in unchanged since prior exam.   Resp: No respiratory distress, normal effort. Neurological: Alert and oriented to person, place, and time. Coordination normal.  Skin: Skin is warm and dry. No rash noted. No diaphoretic. No erythema. No pallor.  Psychiatric: Normal mood and affect. Normal behavior. Judgment and thought content normal.      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 Do not have tissue diagnosis.   Will plan excisional biopsy.  The surgical procedure was described to the patient.  I discussed the incision type and location and whether we would need radiology involved on the day of surgery with a wire marker and/or sentinel node.      The risks and benefits of the procedure were described to the patient and he/she wishes to proceed.    We discussed the risks bleeding, infection, damage to other structures, need for further procedures/surgeries.  We discussed the risk of seroma.  The patient was advised if the area in the breast in cancer, we may need to go back to surgery for additional tissue to obtain negative margins or  for a lymph node biopsy. The patient was advised that these are the most common complications, but that others can occur as well.  They were advised against taking aspirin or other anti-inflammatory agents/blood thinners the week before surgery.       Antaeus Karel L Abraham Entwistle, MD Surgical Oncology, General & Endocrine Surgery [facility]  FRY,STEPHEN A, MD, MD Fry, Stephen A, MD   

## 2011-06-22 NOTE — Anesthesia Postprocedure Evaluation (Signed)
Anesthesia Post Note  Patient: Cheyenne Guzman  Procedure(s) Performed: Procedure(s) (LRB): BREAST BIOPSY (Right)  Anesthesia type: General  Patient location: PACU  Post pain: Pain level controlled and Adequate analgesia  Post assessment: Post-op Vital signs reviewed, Patient's Cardiovascular Status Stable, Respiratory Function Stable, Patent Airway and Pain level controlled  Last Vitals:  Filed Vitals:   06/22/11 0945  BP:   Pulse: 96  Temp:   Resp: 20    Post vital signs: Reviewed and stable  Level of consciousness: awake, alert  and oriented  Complications: No apparent anesthesia complications

## 2011-06-25 ENCOUNTER — Encounter (HOSPITAL_COMMUNITY): Payer: Self-pay | Admitting: General Surgery

## 2011-06-25 ENCOUNTER — Telehealth (INDEPENDENT_AMBULATORY_CARE_PROVIDER_SITE_OTHER): Payer: Self-pay

## 2011-06-25 NOTE — Telephone Encounter (Signed)
I spoke with the patient this morning per instructions from Dr. Ezzard Standing.  He talked to her Saturday night.  She was having severe total body aches post op.  He thought it was some fasciculation that she had during her breast surgery.  She stated that she is a little better today and is up and walking.  I told her she could take up to 800mg  of Ibuprofen daily to help with the aches, and instructed her to call our office if she did not continue to improve.

## 2011-06-26 ENCOUNTER — Telehealth (INDEPENDENT_AMBULATORY_CARE_PROVIDER_SITE_OTHER): Payer: Self-pay | Admitting: General Surgery

## 2011-06-26 NOTE — Telephone Encounter (Signed)
Pt calling for path results; per Dr. Arita Miss note, pt updated there are "no signs of cancer."

## 2011-06-27 LAB — POCT I-STAT 4, (NA,K, GLUC, HGB,HCT)
HCT: 40 % (ref 36.0–46.0)
Hemoglobin: 13.6 g/dL (ref 12.0–15.0)
Sodium: 138 mEq/L (ref 135–145)

## 2011-07-13 ENCOUNTER — Encounter (INDEPENDENT_AMBULATORY_CARE_PROVIDER_SITE_OTHER): Payer: Self-pay | Admitting: General Surgery

## 2011-07-13 ENCOUNTER — Ambulatory Visit (INDEPENDENT_AMBULATORY_CARE_PROVIDER_SITE_OTHER): Payer: BC Managed Care – PPO | Admitting: General Surgery

## 2011-07-13 VITALS — BP 142/86 | HR 84 | Temp 97.6°F | Resp 18 | Ht 68.0 in | Wt 397.4 lb

## 2011-07-13 DIAGNOSIS — N63 Unspecified lump in unspecified breast: Secondary | ICD-10-CM

## 2011-07-13 DIAGNOSIS — N6310 Unspecified lump in the right breast, unspecified quadrant: Secondary | ICD-10-CM

## 2011-07-13 DIAGNOSIS — D0510 Intraductal carcinoma in situ of unspecified breast: Secondary | ICD-10-CM

## 2011-07-13 DIAGNOSIS — D0590 Unspecified type of carcinoma in situ of unspecified breast: Secondary | ICD-10-CM

## 2011-07-13 NOTE — Patient Instructions (Addendum)
Follow up in 6 months for evaluation and breast exam.

## 2011-07-13 NOTE — Assessment & Plan Note (Signed)
followup in 6 months for exam.  Continue mammograms as indicated.

## 2011-07-13 NOTE — Progress Notes (Signed)
HISTORY: Patient is now around 3-4 weeks status post excisional biopsy of right breast mass. The pathology was benign. One area was atypical ductal hyperplasia and the second area was fibrocystic breast disease. She did well from a surgical standpoint with no drainage or redness of her right breast. She did however have a significant reaction to succinylcholine. She heard from her forehead down to her feet and was extremely weak.    EXAM: General:  Alert and oriented times 3  Incision:  Well healed, no erythema.   PATHOLOGY: 1. Breast, excision, Right - ATYPICAL DUCTAL HYPERPLASIA. - FIBROCYSTIC CHANGES WITH ADENOSIS AND CALCIFICATIONS. - SEE COMMENT. 2. Breast, excision, Right subareolar - FIBROCYSTIC CHANGES. - THERE IS NO EVIDENCE OF MALIGNANCY.   ASSESSMENT AND PLAN:   DCIS (ductal carcinoma in situ) of breast, left, treated with BCT 11/2007 followup in 6 months for exam.  Continue mammograms as indicated.  Breast mass, right Benign pathology.  Resume followup schedule for left breast DCIS.      Maudry Diego, MD Surgical Oncology, General & Endocrine Surgery [facility]  Nelwyn Salisbury, MD Nelwyn Salisbury, MD

## 2011-07-13 NOTE — Assessment & Plan Note (Signed)
Benign pathology.  Resume followup schedule for left breast DCIS.

## 2011-08-29 ENCOUNTER — Telehealth: Payer: Self-pay | Admitting: Family Medicine

## 2011-08-29 ENCOUNTER — Other Ambulatory Visit (INDEPENDENT_AMBULATORY_CARE_PROVIDER_SITE_OTHER): Payer: BC Managed Care – PPO

## 2011-08-29 DIAGNOSIS — R300 Dysuria: Secondary | ICD-10-CM

## 2011-08-29 DIAGNOSIS — R350 Frequency of micturition: Secondary | ICD-10-CM

## 2011-08-29 LAB — POCT URINALYSIS DIPSTICK
Bilirubin, UA: NEGATIVE
Glucose, UA: NEGATIVE
Leukocytes, UA: NEGATIVE
Nitrite, UA: NEGATIVE
Urobilinogen, UA: 0.2

## 2011-08-29 NOTE — Telephone Encounter (Signed)
She is having some urinary burning and urgency

## 2011-08-30 NOTE — Progress Notes (Signed)
Quick Note:  Spoke with pt- informed of results and told her if sx persist - needs to be seen ______

## 2011-10-15 ENCOUNTER — Telehealth: Payer: Self-pay | Admitting: Family Medicine

## 2011-10-15 MED ORDER — SIMVASTATIN 40 MG PO TABS: 40.0000 mg | ORAL_TABLET | Freq: Every day | ORAL | Status: DC

## 2011-10-15 NOTE — Telephone Encounter (Signed)
Refill request for Simvastatin and I did send script e-scribe to [facility].

## 2011-10-15 NOTE — Telephone Encounter (Signed)
Refill request for Sertraline 50 mg take 1 po qd and last here on /7/13. ( 90 day supply to [facility] )

## 2011-10-16 ENCOUNTER — Other Ambulatory Visit: Payer: Self-pay | Admitting: Family Medicine

## 2012-02-01 ENCOUNTER — Ambulatory Visit (INDEPENDENT_AMBULATORY_CARE_PROVIDER_SITE_OTHER): Payer: BC Managed Care – PPO | Admitting: General Surgery

## 2012-02-01 ENCOUNTER — Encounter (INDEPENDENT_AMBULATORY_CARE_PROVIDER_SITE_OTHER): Payer: Self-pay | Admitting: General Surgery

## 2012-02-01 VITALS — BP 142/90 | HR 80 | Temp 97.8°F | Resp 18 | Ht 67.5 in | Wt 397.0 lb

## 2012-02-01 DIAGNOSIS — D0590 Unspecified type of carcinoma in situ of unspecified breast: Secondary | ICD-10-CM

## 2012-02-01 DIAGNOSIS — R109 Unspecified abdominal pain: Secondary | ICD-10-CM

## 2012-02-01 DIAGNOSIS — D0510 Intraductal carcinoma in situ of unspecified breast: Secondary | ICD-10-CM

## 2012-02-01 LAB — COMPREHENSIVE METABOLIC PANEL
ALT: 12 U/L (ref 0–35)
AST: 19 U/L (ref 0–37)
Albumin: 4.4 g/dL (ref 3.5–5.2)
Alkaline Phosphatase: 80 U/L (ref 39–117)
BUN: 12 mg/dL (ref 6–23)
Calcium: 9.6 mg/dL (ref 8.4–10.5)
Chloride: 107 mEq/L (ref 96–112)
Potassium: 4.6 mEq/L (ref 3.5–5.3)
Sodium: 139 mEq/L (ref 135–145)

## 2012-02-01 NOTE — Progress Notes (Signed)
HISTORY: Pt is 48 year old female status post breast conservation therapy in 2009 for left breast DCIS. She had a right-sided breast mass that cannot be adequately assessed without surgery and so this was removed last summer. The right-sided mass was benign. She presents for followup. She has not had any new breast complaints. She has developed right upper quadrant pain and swelling. She states that it is sore a little but all the time, but is worse when she is reaching for things and bending over. She has not noticed a relationship with food.   PERTINENT REVIEW OF SYSTEMS: otherwise negative.     EXAM: Head: Normocephalic and atraumatic.  Eyes:  Conjunctivae are normal. Pupils are equal, round, and reactive to light. No scleral icterus.  Neck:  Normal range of motion. Neck supple. No tracheal deviation present. No thyromegaly present.  Breast:  Wounds well healed.  No recurrent mass or skin dimpling.  Breasts are symmetric.  No lymphadenopathy.   Resp: No respiratory distress, normal effort. Abd:  Abdomen is soft, protuberant.  There is no rebound and no guarding. There is swelling and fullness in the RUQ. Neurological: Alert and oriented to person, place, and time. Coordination normal.  Skin: Skin is warm and dry. No rash noted. No diaphoretic. No erythema. No pallor.  Psychiatric: Normal mood and affect. Normal behavior. Judgment and thought content normal.      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 Patient scheduled for repeat mammogram as appropriate.  No evidence of recurrent breast mass.  Followup in 6 months.  Abdominal pain Given the masslike region and right upper quadrant pain, I will order a liver panel and an abdominal CT. The patient has a history of breast cancer and a family history of colon cancer and cirrhosis.  I suspect this is likely just a deep abdominal wall lipoma, but  given her body habitus I cannot assess this for certain.    Maudry Diego, MD Surgical Oncology, General & Endocrine Surgery [facility]  Nelwyn Salisbury, MD Nelwyn Salisbury, MD

## 2012-02-01 NOTE — Patient Instructions (Addendum)
Get labs and CT scan.  Follow up in 6 months unless issues arise.

## 2012-02-01 NOTE — Assessment & Plan Note (Signed)
No clinical evidence of disease. Patient scheduled for repeat mammogram as appropriate.  No evidence of recurrent breast mass.  Followup in 6 months.

## 2012-02-01 NOTE — Assessment & Plan Note (Signed)
Given the masslike region and right upper quadrant pain, I will order a liver panel and an abdominal CT. The patient has a history of breast cancer and a family history of colon cancer and cirrhosis.  I suspect this is likely just a deep abdominal wall lipoma, but given her body habitus I cannot assess this for certain.

## 2012-02-04 NOTE — Progress Notes (Signed)
Quick Note:  Please let Cheyenne Guzman know that her liver function tests are completely normal. Glucose is 123. ______

## 2012-02-07 ENCOUNTER — Ambulatory Visit
Admission: RE | Admit: 2012-02-07 | Discharge: 2012-02-07 | Disposition: A | Discharge status: Home / Self Care | Payer: BC Managed Care – PPO | Source: Ambulatory Visit | Attending: General Surgery | Admitting: General Surgery

## 2012-02-07 ENCOUNTER — Telehealth (INDEPENDENT_AMBULATORY_CARE_PROVIDER_SITE_OTHER): Payer: Self-pay

## 2012-02-07 DIAGNOSIS — R109 Unspecified abdominal pain: Secondary | ICD-10-CM

## 2012-02-07 MED ORDER — IOHEXOL 300 MG/ML  SOLN
125.0000 mL | Freq: Once | INTRAMUSCULAR | Status: AC | PRN
  Administered 2012-02-07: 125 mL via INTRAVENOUS

## 2012-02-07 NOTE — Telephone Encounter (Signed)
Pt notified that liver is enlarged, but no apparent cirrhosis.  Dr. Donell Beers may contact her w/ more information on 02/08/12.

## 2012-02-07 NOTE — Progress Notes (Signed)
Quick Note:  Please let pt know that liver is large, but does not look cirrhotic. ______

## 2012-02-14 ENCOUNTER — Telehealth (INDEPENDENT_AMBULATORY_CARE_PROVIDER_SITE_OTHER): Payer: Self-pay

## 2012-02-14 NOTE — Telephone Encounter (Signed)
Pt made aware liver functions are normal.  Glucose is elevated at 123.

## 2012-05-12 ENCOUNTER — Other Ambulatory Visit: Payer: Self-pay | Admitting: Family Medicine

## 2012-05-23 ENCOUNTER — Telehealth: Payer: Self-pay | Admitting: Family Medicine

## 2012-05-23 NOTE — Telephone Encounter (Signed)
PT husband called to request a refill of her sertraline (ZOLOFT) 50 MG tablet, she takes it once a day. [facility] [address]. Please assist.

## 2012-05-26 MED ORDER — SERTRALINE HCL 50 MG PO TABS: ORAL_TABLET | ORAL | Status: DC

## 2012-05-26 NOTE — Telephone Encounter (Signed)
[facility]  

## 2012-05-26 NOTE — Telephone Encounter (Signed)
[facility]

## 2012-05-27 ENCOUNTER — Telehealth: Payer: Self-pay | Admitting: Family Medicine

## 2012-05-27 NOTE — Telephone Encounter (Signed)
Pt needs refill of furosemide (LASIX) 40 MG tablet. [facility] [address]. Pt last seen by Dr Clent Ridges 12/28/2010

## 2012-05-28 NOTE — Telephone Encounter (Signed)
Can we refill this? 

## 2012-05-29 MED ORDER — FUROSEMIDE 40 MG PO TABS: 40.0000 mg | ORAL_TABLET | Freq: Two times a day (BID) | ORAL | Status: DC | PRN

## 2012-05-29 NOTE — Telephone Encounter (Signed)
I sent script e-scribe. 

## 2012-05-29 NOTE — Telephone Encounter (Signed)
She takes these bid. Okay for one year supply

## 2012-06-20 ENCOUNTER — Encounter (INDEPENDENT_AMBULATORY_CARE_PROVIDER_SITE_OTHER): Payer: Self-pay | Admitting: General Surgery

## 2012-07-02 ENCOUNTER — Other Ambulatory Visit: Payer: Self-pay | Admitting: Family Medicine

## 2012-07-02 ENCOUNTER — Telehealth: Payer: Self-pay | Admitting: Family Medicine

## 2012-07-02 ENCOUNTER — Other Ambulatory Visit (INDEPENDENT_AMBULATORY_CARE_PROVIDER_SITE_OTHER): Payer: BC Managed Care – PPO

## 2012-07-02 DIAGNOSIS — R109 Unspecified abdominal pain: Secondary | ICD-10-CM

## 2012-07-02 LAB — POCT URINALYSIS DIPSTICK
Blood, UA: NEGATIVE
Glucose, UA: NEGATIVE
Ketones, UA: NEGATIVE
Spec Grav, UA: 1.02
Urobilinogen, UA: 0.2

## 2012-07-02 NOTE — Telephone Encounter (Signed)
I spoke with pt and put the order in computer. She is going to drop off this afternoon.

## 2012-07-02 NOTE — Telephone Encounter (Signed)
Patient Information:  Caller Name: Aleesia  Phone: [phone]  Patient: Cheyenne Guzman, Cheyenne Guzman  Gender: Female  DOB: May 09, 1965  Age: 47 Years  PCP: Gershon Crane [facility])  Pregnant: No  Office Follow Up:  Does the office need to follow up with this patient?: Yes  Instructions For The Office: Patient asks to have specimen checked only instead of seeing MD; relates she cannot afford to miss work.  Rx to [facility] if indicated. Pharmacy information confirmed in Epic.     Symptoms  Reason For Call & Symptoms: Patient calling about possible UTI.  Temp up to 101 by mouth  07/01/12 (currently afebrile).  Pain with full bladder and after urination, fatigue, nausea are other symptoms.  Back pain rated at 3 of 10; relates she has chronic back problems. Groin pain rated at 10 on 07/01/12, currently 5-6 of 10.  Emergent symptoms ruled out.  Go to Office Now per Urination Pain - Female guideline due to  "Side (flank) or lower back pain present"and fever 100.5 or greater. Home care for the interim and parameters for callback given.   Caller states she cannot miss work and asks if she can have a specimen checked without seeing provider.  Information noted and sent to office for follow up.  Reviewed Health History In EMR: Yes  Reviewed Medications In EMR: Yes  Reviewed Allergies In EMR: Yes  Reviewed Surgeries / Procedures: Yes  Date of Onset of Symptoms: 07/01/2012  Treatments Tried: Tylenol, Advil, increased liquids.  Treatments Tried Worked: Yes OB / GYN:  LMP: Unknown  Guideline(s) Used:  Urination Pain - Female  Disposition Per Guideline:   Go to Office Now  Reason For Disposition Reached:   Side (flank) or lower back pain present  Advice Given:  Fluids:   Drink extra fluids. Drink 8-10 glasses of liquids a day (Reason: to produce a dilute, non-irritating urine).  Cranberry Juice:   Caution: Do not drink more than 16 oz (480 ml). Here is the reason: too much cranberry  juice can also be irritating to the bladder.  Warm Saline SITZ Baths to Reduce Pain:  Sit in a warm saline bath for 20 minutes to cleanse the area and to reduce pain. Add 2 oz. of table salt or baking soda to a tub of water.  Call Back If:  You become worse.  Patient Refused Recommendation:  Patient Refused Care Advice  States she cannot miss work. Asks to have specimen checked.

## 2012-07-02 NOTE — Telephone Encounter (Signed)
I left voice message with normal urine results. Advised pt to schedule a office visit to evaluate further.

## 2012-07-02 NOTE — Telephone Encounter (Signed)
Okay for her to leave a urine sample

## 2012-07-02 NOTE — Telephone Encounter (Signed)
Patient Information:  Caller Name: Marcile  Phone: [phone]  Patient: Cheyenne Guzman, Cheyenne Guzman  Gender: Female  DOB: 1965/08/12  Age: 47 Years  PCP: Gershon Crane [facility])  Pregnant: No  Office Follow Up:  Does the office need to follow up with this patient?: Yes  Instructions For The Office: Call ASAP regarding work in appointment or lab only visit if approved by MD.  RN Note:  No reply from office staff and no orders seen in Epic.  Declined repeat triage.  Advised MD orders specify must be seen for antibiotics so therefore advised should schedule appointment this afternoon per previous triage.  No appointments remain with Dr Clent Ridges.  Message sent high priority to office staff for possible work in appointment 07/02/12. Please call back ASAP.  She needs 10 minutes to get to office.  Symptoms  Reason For Call & Symptoms: 2nd call 07/02/12 regarding UTI symtoms with dysuria, nausea, fever 101.7 07/01/12, and back and flank pain.  After earlier triage at [phone], message was sent to office staff per caller request requesting lab visit only due to difficulty getting off work.  Reviewed Health History In EMR: N/A  Reviewed Medications In EMR: N/A  Reviewed Allergies In EMR: N/A  Reviewed Surgeries / Procedures: N/A  Date of Onset of Symptoms: 07/01/2012 OB / GYN:  LMP: Unknown  Guideline(s) Used:  No Protocol Available - Sick Adult  Disposition Per Guideline:   Discuss with PCP and Callback by Nurse within 1 Hour  Reason For Disposition Reached:   Nursing judgment  Advice Given:  N/A  Patient Will Follow Care Advice:  YES

## 2012-07-03 ENCOUNTER — Observation Stay (HOSPITAL_COMMUNITY)
Admission: EM | Admit: 2012-07-03 | Discharge: 2012-07-05 | Disposition: A | Discharge status: Home / Self Care | Payer: BC Managed Care – PPO | Attending: Internal Medicine | Admitting: Internal Medicine

## 2012-07-03 ENCOUNTER — Telehealth: Payer: Self-pay | Admitting: Family Medicine

## 2012-07-03 ENCOUNTER — Encounter (HOSPITAL_COMMUNITY): Payer: Self-pay | Admitting: Emergency Medicine

## 2012-07-03 DIAGNOSIS — R609 Edema, unspecified: Secondary | ICD-10-CM

## 2012-07-03 DIAGNOSIS — M7989 Other specified soft tissue disorders: Secondary | ICD-10-CM | POA: Insufficient documentation

## 2012-07-03 DIAGNOSIS — J45909 Unspecified asthma, uncomplicated: Secondary | ICD-10-CM | POA: Insufficient documentation

## 2012-07-03 DIAGNOSIS — L02419 Cutaneous abscess of limb, unspecified: Principal | ICD-10-CM | POA: Insufficient documentation

## 2012-07-03 DIAGNOSIS — L03119 Cellulitis of unspecified part of limb: Secondary | ICD-10-CM | POA: Insufficient documentation

## 2012-07-03 DIAGNOSIS — E785 Hyperlipidemia, unspecified: Secondary | ICD-10-CM | POA: Insufficient documentation

## 2012-07-03 DIAGNOSIS — Z794 Long term (current) use of insulin: Secondary | ICD-10-CM | POA: Insufficient documentation

## 2012-07-03 DIAGNOSIS — Z853 Personal history of malignant neoplasm of breast: Secondary | ICD-10-CM | POA: Insufficient documentation

## 2012-07-03 DIAGNOSIS — L03116 Cellulitis of left lower limb: Secondary | ICD-10-CM

## 2012-07-03 DIAGNOSIS — Z6841 Body Mass Index (BMI) 40.0 and over, adult: Secondary | ICD-10-CM | POA: Insufficient documentation

## 2012-07-03 DIAGNOSIS — E1169 Type 2 diabetes mellitus with other specified complication: Secondary | ICD-10-CM | POA: Diagnosis present

## 2012-07-03 DIAGNOSIS — L0390 Cellulitis, unspecified: Secondary | ICD-10-CM

## 2012-07-03 DIAGNOSIS — E119 Type 2 diabetes mellitus without complications: Secondary | ICD-10-CM | POA: Insufficient documentation

## 2012-07-03 DIAGNOSIS — D0510 Intraductal carcinoma in situ of unspecified breast: Secondary | ICD-10-CM | POA: Diagnosis present

## 2012-07-03 LAB — CBC
Hemoglobin: 13.5 g/dL (ref 12.0–15.0)
MCH: 26.5 pg (ref 26.0–34.0)
MCHC: 32.5 g/dL (ref 30.0–36.0)
MCV: 81.4 fL (ref 78.0–100.0)

## 2012-07-03 LAB — GLUCOSE, CAPILLARY: Glucose-Capillary: 108 mg/dL — ABNORMAL HIGH (ref 70–99)

## 2012-07-03 LAB — BASIC METABOLIC PANEL
CO2: 28 mEq/L (ref 19–32)
Calcium: 9.2 mg/dL (ref 8.4–10.5)
Creatinine, Ser: 1.02 mg/dL (ref 0.50–1.10)
GFR calc non Af Amer: 64 mL/min — ABNORMAL LOW (ref >90)
Glucose, Bld: 182 mg/dL — ABNORMAL HIGH (ref 70–99)
Sodium: 138 mEq/L (ref 135–145)

## 2012-07-03 LAB — URINALYSIS, ROUTINE W REFLEX MICROSCOPIC
Bilirubin Urine: NEGATIVE
Hgb urine dipstick: NEGATIVE
Ketones, ur: NEGATIVE mg/dL
Nitrite: NEGATIVE
Urobilinogen, UA: 0.2 mg/dL (ref 0.0–1.0)
pH: 5 (ref 5.0–8.0)

## 2012-07-03 MED ORDER — IBUPROFEN 800 MG PO TABS
800.0000 mg | ORAL_TABLET | Freq: Once | ORAL | Status: AC
  Administered 2012-07-03: 800 mg via ORAL
  Filled 2012-07-03: qty 1

## 2012-07-03 MED ORDER — OXYCODONE-ACETAMINOPHEN 5-325 MG PO TABS
1.0000 | ORAL_TABLET | Freq: Once | ORAL | Status: AC
  Administered 2012-07-03: 1 via ORAL
  Filled 2012-07-03: qty 1

## 2012-07-03 MED ORDER — ASPIRIN EC 325 MG PO TBEC: 325.0000 mg | DELAYED_RELEASE_TABLET | Freq: Every day | ORAL | Status: DC

## 2012-07-03 MED ORDER — TRAZODONE HCL 50 MG PO TABS
50.0000 mg | ORAL_TABLET | Freq: Every evening | ORAL | Status: DC | PRN
  Filled 2012-07-03: qty 1

## 2012-07-03 MED ORDER — OXYCODONE HCL 5 MG PO TABS: 5.0000 mg | ORAL_TABLET | ORAL | Status: DC | PRN

## 2012-07-03 MED ORDER — INSULIN ASPART 100 UNIT/ML ~~LOC~~ SOLN: 75.0000 [IU] | Freq: Three times a day (TID) | SUBCUTANEOUS | Status: DC

## 2012-07-03 MED ORDER — SERTRALINE HCL 50 MG PO TABS
50.0000 mg | ORAL_TABLET | Freq: Every day | ORAL | Status: DC
  Administered 2012-07-03 – 2012-07-05 (×3): 50 mg via ORAL
  Filled 2012-07-03 (×3): qty 1

## 2012-07-03 MED ORDER — VANCOMYCIN HCL IN DEXTROSE 1-5 GM/200ML-% IV SOLN
1000.0000 mg | Freq: Once | INTRAVENOUS | Status: AC
  Administered 2012-07-03: 1000 mg via INTRAVENOUS
  Filled 2012-07-03: qty 200

## 2012-07-03 MED ORDER — INSULIN ASPART 100 UNIT/ML ~~LOC~~ SOLN
50.0000 [IU] | Freq: Three times a day (TID) | SUBCUTANEOUS | Status: DC
  Administered 2012-07-03: 50 [IU] via SUBCUTANEOUS
  Administered 2012-07-04: 25 [IU] via SUBCUTANEOUS
  Administered 2012-07-04 (×2): 50 [IU] via SUBCUTANEOUS
  Administered 2012-07-05: 25 [IU] via SUBCUTANEOUS

## 2012-07-03 MED ORDER — VANCOMYCIN HCL 10 G IV SOLR
1500.0000 mg | Freq: Two times a day (BID) | INTRAVENOUS | Status: DC
  Administered 2012-07-04 – 2012-07-05 (×3): 1500 mg via INTRAVENOUS
  Filled 2012-07-03 (×4): qty 1500

## 2012-07-03 MED ORDER — INSULIN NPH (HUMAN) (ISOPHANE) 100 UNIT/ML ~~LOC~~ SUSP: 50.0000 [IU] | Freq: Three times a day (TID) | SUBCUTANEOUS | Status: DC

## 2012-07-03 MED ORDER — SIMVASTATIN 40 MG PO TABS
40.0000 mg | ORAL_TABLET | Freq: Every evening | ORAL | Status: DC
  Administered 2012-07-03 – 2012-07-04 (×2): 40 mg via ORAL
  Filled 2012-07-03 (×3): qty 1

## 2012-07-03 MED ORDER — VANCOMYCIN HCL IN DEXTROSE 1-5 GM/200ML-% IV SOLN: 1000.0000 mg | Freq: Once | INTRAVENOUS | Status: DC

## 2012-07-03 MED ORDER — ASPIRIN EC 81 MG PO TBEC
81.0000 mg | DELAYED_RELEASE_TABLET | Freq: Every day | ORAL | Status: DC
  Administered 2012-07-03 – 2012-07-05 (×3): 81 mg via ORAL
  Filled 2012-07-03 (×3): qty 1

## 2012-07-03 MED ORDER — ENOXAPARIN SODIUM 30 MG/0.3ML ~~LOC~~ SOLN
40.0000 mg | SUBCUTANEOUS | Status: DC
  Administered 2012-07-03 – 2012-07-04 (×2): 40 mg via SUBCUTANEOUS
  Filled 2012-07-03 (×3): qty 0.4

## 2012-07-03 MED ORDER — ACETAMINOPHEN 325 MG PO TABS: 650.0000 mg | ORAL_TABLET | Freq: Four times a day (QID) | ORAL | Status: DC | PRN

## 2012-07-03 MED ORDER — ALBUTEROL SULFATE HFA 108 (90 BASE) MCG/ACT IN AERS: 2.0000 | INHALATION_SPRAY | RESPIRATORY_TRACT | Status: DC | PRN

## 2012-07-03 MED ORDER — INSULIN NPH (HUMAN) (ISOPHANE) 100 UNIT/ML ~~LOC~~ SUSP
75.0000 [IU] | Freq: Three times a day (TID) | SUBCUTANEOUS | Status: DC
  Administered 2012-07-03 – 2012-07-05 (×5): 75 [IU] via SUBCUTANEOUS
  Filled 2012-07-03: qty 10

## 2012-07-03 MED ORDER — METFORMIN HCL 500 MG PO TABS
1000.0000 mg | ORAL_TABLET | Freq: Every day | ORAL | Status: DC
  Administered 2012-07-03: 1000 mg via ORAL
  Filled 2012-07-03 (×3): qty 2

## 2012-07-03 MED ORDER — PROMETHAZINE HCL 12.5 MG PO TABS: 12.5000 mg | ORAL_TABLET | Freq: Four times a day (QID) | ORAL | Status: DC | PRN

## 2012-07-03 MED ORDER — CIPROFLOXACIN IN D5W 400 MG/200ML IV SOLN
400.0000 mg | Freq: Two times a day (BID) | INTRAVENOUS | Status: DC
  Administered 2012-07-03 – 2012-07-05 (×4): 400 mg via INTRAVENOUS
  Filled 2012-07-03 (×5): qty 200

## 2012-07-03 NOTE — ED Provider Notes (Signed)
History     CSN: [id number]  Arrival date & time 07/03/12  9562   First MD Initiated Contact with Patient 07/03/12 0940      Chief Complaint  Patient presents with  . Groin Pain    (Consider location/radiation/quality/duration/timing/severity/associated sxs/prior treatment) HPI Cheyenne Guzman is a 47 y.o. female with a history of diabetes, stroke, and hyperlipidemia presents to the emergency department complaining of left lower extremity swelling, redness and pain.  Patient reports that originally pain began yesterday in the calf and developed to the groin and now is the whole posterior extremity.  Swelling warmth and erythema began this morning.  Patient reports that she was on a long car ride Tuesday night from [place] to [place].  Mother has a history of DVT but patient denies any history of blood clots herself.  Patient not on hormone replacement therapy and denies recent surgery, chest pain, hemoptysis, shortness of breath, dyspnea on exertion, abdominal pain, nausea, headaches, vision, fevers, night sweats or chills.  Past Medical History  Diagnosis Date  . Depression   . Hyperlipidemia   . TIA (transient ischemic attack)     in 2007,2008, and 2011  . Urosepsis 08-2009    with Klebsiella  . Breast mass in female   . Stroke   . Asthma     seasonal   . Diabetes mellitus     sees Dr. Talmage Nap, diagnosed 2003  . Kidney infection     - hosp. [facility]- septic- 10/2009  . Cancer     breast, sees Dr. Darnelle Catalan , Left - 11/2007  . Arthritis     back & ankles   . Anxiety     per pt. - mild   . Peripheral neuropathy     R sided numbness- face & jaw    Past Surgical History  Procedure Laterality Date  . Foot surgery      plantar fasciitis  . Nerve graft  06-01-08    cadaver graft to right inferior alveolar nerve  in [place]   . Abdominal hysterectomy  08/2004  . Breast surgery  11/2007    left ductal carcinoma in situ  . Breast biopsy  06/22/2011    Procedure: BREAST BIOPSY;   Surgeon: Almond Lint, MD;  Location: [facility] OR;  Service: General;  Laterality: Right;    Family History  Problem Relation Age of Onset  . Cancer Mother     breast  . Cancer Father     lung  . Cancer Sister     colon  . Cancer Maternal Aunt     breast - both  . Anesthesia problems Neg Hx     History  Substance Use Topics  . Smoking status: Former Smoker    Quit date: 02/18/1997  . Smokeless tobacco: Never Used  . Alcohol Use: Yes     Comment: 1 glass of wine per month     OB History   Grav Para Term Preterm Abortions TAB SAB Ect Mult Living                  Review of Systems Ten systems reviewed and are negative for acute change, except as noted in the HPI.    Allergies  Review of patient's allergies indicates no known allergies.  Home Medications   Current Outpatient Rx  Name  Route  Sig  Dispense  Refill  . albuterol (PROVENTIL HFA;VENTOLIN HFA) 108 (90 BASE) MCG/ACT inhaler   Inhalation   Inhale 2 puffs  into the lungs every 4 (four) hours as needed for wheezing or shortness of breath.         Marland Kitchen albuterol (PROVENTIL) (2.5 MG/3ML) 0.083% nebulizer solution   Nebulization   Take 2.5 mg by nebulization every 4 (four) hours as needed for wheezing.         Marland Kitchen aspirin 325 MG EC tablet   Oral   Take 325 mg by mouth daily.          . furosemide (LASIX) 40 MG tablet   Oral   Take 1 tablet (40 mg total) by mouth 2 (two) times daily as needed. As needed for swelling.   60 tablet   11   . insulin lispro (HUMALOG) 100 UNIT/ML injection   Subcutaneous   Inject 75 Units into the skin 3 (three) times daily before meals.          . insulin NPH (NOVOLIN N) 100 UNIT/ML injection   Subcutaneous   Inject 50-75 Units into the skin 3 (three) times daily. Depending on carb intake.         . metFORMIN (GLUCOPHAGE) 500 MG tablet   Oral   Take 1,000 mg by mouth daily after supper.          . Multiple Vitamin (MULTIVITAMIN WITH MINERALS) TABS   Oral   Take 1  tablet by mouth at bedtime.         . sertraline (ZOLOFT) 50 MG tablet   Oral   Take 50 mg by mouth daily.         . simvastatin (ZOCOR) 40 MG tablet   Oral   Take 40 mg by mouth every evening.           BP 113/49  Pulse 83  Temp(Src) 98.5 F (36.9 C) (Oral)  Resp 18  SpO2 98%  Physical Exam  Nursing note and vitals reviewed. Constitutional: She is oriented to person, place, and time. She appears well-developed and well-nourished. No distress.  HENT:  Head: Normocephalic and atraumatic.  Eyes: Conjunctivae and EOM are normal.  Neck: Normal range of motion.  Cardiovascular: Normal rate, regular rhythm, normal heart sounds and intact distal pulses.   Pulmonary/Chest: Effort normal. No respiratory distress. She has no wheezes.  Abdominal: Soft. There is no tenderness.  Musculoskeletal: Normal range of motion.  Tenderness to palpation in left inguinal region and posterior calf  Neurological: She is alert and oriented to person, place, and time.  Skin: Skin is warm and dry. She is not diaphoretic.  Warmth and erythema extending from left ankle 2 left midcalf.  Swelling noted but no pitting edema  Psychiatric: She has a normal mood and affect. Her behavior is normal.    ED Course  Procedures (including critical care time)  Labs Reviewed  CBC - Abnormal; Notable for the following:    RDW 16.3 (*)    All other components within normal limits  BASIC METABOLIC PANEL - Abnormal; Notable for the following:    Glucose, Bld 182 (*)    GFR calc non Af Amer 64 (*)    GFR calc Af Amer 75 (*)    All other components within normal limits  CULTURE, BLOOD (ROUTINE X 2)  CULTURE, BLOOD (ROUTINE X 2)  URINALYSIS, ROUTINE W REFLEX MICROSCOPIC   No results found.   1. Edema   2. Cellulitis     Patient reevaluated and erythema  Appears to be spreading up the leg with red streaking.  Blood cultures obtained  and IV vancomycin started.  Patient to be admitted for observation  overnight and continued IV antibiotics.  MDM   48 year old diabetic female with a history of breast cancer to emergency Department complaining of left lower extremity swelling, warmth and erythema status post long car trip Tuesday evening from [place].  Vascular imaging performed without evidence of DVT.  On reevaluation erythema was seen spreading up the leg.  IV antibiotics given as above.  Patient be admitted for observation.  Disposition and plan discussed with attending who is agreeable. The patient appears reasonably stabilized for admission considering the current resources, flow, and capabilities available in the ED at this time, and I doubt any other [facility] requiring further screening and/or treatment in the ED prior to admission.        Jaci Carrel, [place] 07/03/12 1404

## 2012-07-03 NOTE — ED Notes (Signed)
Pt c/o left groin pain x several days worse this am; pt sts swelling and redness in left leg this am; pt sts recent long car ride and sts fever on Tuesday night

## 2012-07-03 NOTE — H&P (Signed)
[facility] History and Physical  Cheyenne Guzman BJY:[id number] DOB: [birth year] DOA: 07/03/2012  Referring physician: Blinda Leatherwood PCP: Nelwyn Salisbury, MD   Chief Complaint:   HPI: Cheyenne Guzman is a 47 y.o. female who presents with left leg/groin pain since this am. Subjective fevers/chills/sweats for 2 days. Bitten by an insect a few days ago.  Found to have left leg cellulitis in the ED, progressively worsening during ED stay.  Doppler legs negative.  Vitals and WBC ok.  Diabetic.  No previous h/o similar. Given vancomycin in ED.  Review of Systems:  Systems reviewed and as above, otherwise negative.  Past Medical History  Diagnosis Date  . Depression   . Hyperlipidemia   . TIA (transient ischemic attack)     in 2007,2008, and 2011  . Urosepsis 08-2009    with Klebsiella  . Breast mass in female   . Stroke   . Asthma     seasonal   . Diabetes mellitus     sees Dr. Talmage Nap, diagnosed 2003  . Kidney infection     - hosp. [facility]- septic- 10/2009  . Cancer     breast, sees Dr. Darnelle Catalan , Left - 11/2007  . Arthritis     back & ankles   . Anxiety     per pt. - mild   . Peripheral neuropathy     R sided numbness- face & jaw   Past Surgical History  Procedure Laterality Date  . Foot surgery      plantar fasciitis  . Nerve graft  06-01-08    cadaver graft to right inferior alveolar nerve  in [place]   . Abdominal hysterectomy  08/2004  . Breast surgery  11/2007    left ductal carcinoma in situ  . Breast biopsy  06/22/2011    Procedure: BREAST BIOPSY;  Surgeon: Almond Lint, MD;  Location: [facility] OR;  Service: General;  Laterality: Right;   Social History:  reports that she quit smoking about 15 years ago. She has never used smokeless tobacco. She reports that  drinks alcohol. She reports that she does not use illicit drugs.   No Known Allergies  Family History  Problem Relation Age of Onset  . Cancer Mother     breast  . Cancer Father     lung  . Cancer Sister     colon  .  Cancer Maternal Aunt     breast - both  . Anesthesia problems Neg Hx     Prior to Admission medications   Medication Sig Start Date End Date Taking? Authorizing Provider  albuterol (PROVENTIL HFA;VENTOLIN HFA) 108 (90 BASE) MCG/ACT inhaler Inhale 2 puffs into the lungs every 4 (four) hours as needed for wheezing or shortness of breath.   Yes Historical Provider, MD  albuterol (PROVENTIL) (2.5 MG/3ML) 0.083% nebulizer solution Take 2.5 mg by nebulization every 4 (four) hours as needed for wheezing.   Yes Historical Provider, MD  aspirin 325 MG EC tablet Take 325 mg by mouth daily.    Yes Historical Provider, MD  furosemide (LASIX) 40 MG tablet Take 1 tablet (40 mg total) by mouth 2 (two) times daily as needed. As needed for swelling. 05/29/12  Yes Nelwyn Salisbury, MD  insulin lispro (HUMALOG) 100 UNIT/ML injection Inject 75 Units into the skin 3 (three) times daily before meals.    Yes Historical Provider, MD  insulin NPH (NOVOLIN N) 100 UNIT/ML injection Inject 50-75 Units into the skin 3 (three) times daily. Depending on  carb intake.   Yes Historical Provider, MD  metFORMIN (GLUCOPHAGE) 500 MG tablet Take 1,000 mg by mouth daily after supper.    Yes Historical Provider, MD  Multiple Vitamin (MULTIVITAMIN WITH MINERALS) TABS Take 1 tablet by mouth at bedtime.   Yes Historical Provider, MD  sertraline (ZOLOFT) 50 MG tablet Take 50 mg by mouth daily.   Yes Historical Provider, MD  simvastatin (ZOCOR) 40 MG tablet Take 40 mg by mouth every evening.   Yes Historical Provider, MD   Physical Exam: Filed Vitals:   07/03/12 0938 07/03/12 1004 07/03/12 1219  BP: 146/68 139/62 113/49  Pulse: 84 112 83  Temp: 98.5 F (36.9 C)    TempSrc: Oral    Resp: 18 20 18   SpO2: 96% 94% 98%    BP 97/43  Pulse 73  Temp(Src) 98.3 F (36.8 C) (Oral)  Resp 13  Ht 5' 7.5" (1.715 m)  Wt 176.449 kg (389 lb)  BMI 59.99 kg/m2  SpO2 97%  General Appearance:    Alert, cooperative, no distress, obese. nontoxic   Head:    Normocephalic, without obvious abnormality, atraumatic  Eyes:    PERRL, conjunctiva/corneas clear, EOM's intact, fundi    benign, both eyes     Nose:   Nares normal, septum midline, mucosa normal, no drainage    or sinus tenderness  Throat:   Lips, mucosa, and tongue normal; teeth and gums normal  Neck:   Supple, symmetrical, trachea midline, no adenopathy;    thyroid:  no enlargement/tenderness/nodules; no carotid   bruit or JVD  Back:     Symmetric, no curvature, ROM normal, no CVA tenderness  Lungs:     Clear to auscultation bilaterally, respirations unlabored  Chest Wall:    No tenderness or deformity   Heart:    Regular rate and rhythm, S1 and S2 normal, no murmur, rub   or gallop     Abdomen:     Soft, non-tender, bowel sounds active obese  Genitalia:  deferred  Rectal:   deferred  Extremities:  left leg with erythema, warmth tenderness cicumferentially from ankle to knee. Small papule without drainage pretibial area  Pulses:   2+ and symmetric all extremities  Skin:   Skin color, texture, turgor normal, no rashes or lesions  Lymph nodes:   Cervical, supraclavicular, and axillary nodes normal  Neurologic:   CNII-XII intact, normal strength, sensation and reflexes    throughout    Psychiatric: normal affect  Labs on Admission:  Basic Metabolic Panel:  Recent Labs Lab 07/03/12 1044  NA 138  K 4.7  CL 102  CO2 28  GLUCOSE 182*  BUN 16  CREATININE 1.02  CALCIUM 9.2   Liver Function Tests: No results found for this basename: AST, ALT, ALKPHOS, BILITOT, PROT, ALBUMIN,  in the last 168 hours No results found for this basename: LIPASE, AMYLASE,  in the last 168 hours No results found for this basename: AMMONIA,  in the last 168 hours CBC:  Recent Labs Lab 07/03/12 1044  WBC 7.6  HGB 13.5  HCT 41.5  MCV 81.4  PLT 195   Assessment/Plan Principal Problem:   Cellulitis of left leg Active Problems:   DIABETES MELLITUS, TYPE II   HYPERLIPIDEMIA    ASTHMA   DCIS (ductal carcinoma in situ) of breast, left, treated with BCT 11/2007   Morbid obesity  Observation. vanc and cipro. countinue outpatient meds. Monitor CBG. DVT prophylaxis  Code Status: full Family Communication: multiple at bedside Disposition Plan:  home  Time spent: 50 minutes  Christiane Ha [facility] Pager [phone]  If 7PM-7AM, please contact night-coverage [email or web address] Password [facility] 07/03/2012, 2:48 PM

## 2012-07-03 NOTE — Progress Notes (Signed)
ANTIBIOTIC CONSULT NOTE - INITIAL  Pharmacy Consult for Vancomycin  Indication: LLE cellulitis  No Known Allergies  Patient-Reported Measurements:   Ht: 67.5 in Wt: 177 kg  Vital Signs: Temp: 98.5 F (36.9 C) (06/12 1610) Temp src: Oral (06/12 0938) BP: 113/49 mmHg (06/12 1219) Pulse Rate: 83 (06/12 1219) Intake/Output from previous day:   Intake/Output from this shift:    Labs:  Recent Labs  07/03/12 1044  WBC 7.6  HGB 13.5  PLT 195  CREATININE 1.02   The CrCl is unknown because both a height and weight (above a minimum accepted value) are required for this calculation. No results found for this basename: VANCOTROUGH, VANCOPEAK, VANCORANDOM, GENTTROUGH, GENTPEAK, GENTRANDOM, TOBRATROUGH, TOBRAPEAK, TOBRARND, AMIKACINPEAK, AMIKACINTROU, AMIKACIN,  in the last 72 hours   Microbiology: No results found for this or any previous visit (from the past 720 hour(s)).  Medical History: Past Medical History  Diagnosis Date  . Depression   . Hyperlipidemia   . TIA (transient ischemic attack)     in 2007,2008, and 2011  . Urosepsis 08-2009    with Klebsiella  . Breast mass in female   . Stroke   . Asthma     seasonal   . Diabetes mellitus     sees Dr. Talmage Nap, diagnosed 2003  . Kidney infection     - hosp. [facility]- septic- 10/2009  . Cancer     breast, sees Dr. Darnelle Catalan , Left - 11/2007  . Arthritis     back & ankles   . Anxiety     per pt. - mild   . Peripheral neuropathy     R sided numbness- face & jaw    Assessment: 47 y/o F who presents with LLE swelling, redness, and pain after long car trip. Preliminary LE venous duplex shows no evidence of DVT. WBC 7.6, Tmax 98.5, Scr 1.02.   ED Antibiotics Vancomycin 1000 mg x 1 at 1414  Goal of Therapy:  Vancomycin trough level 10-15 mcg/ml  Plan:  -Additional Vancomycin 1000 mg IV x 1 to equal 2000 mg load -Start vancomycin 1500 mg IV q12h -Cipro per MD -Trend WBC, temp, renal function, any micro data -Drug  levels as indicated -F/U change to PO   Abran Duke, PharmD Clinical Pharmacist Phone: [phone] Pager: [phone] 07/03/2012 3:51 PM

## 2012-07-03 NOTE — ED Notes (Signed)
PA at bedside.

## 2012-07-03 NOTE — ED Notes (Addendum)
Pt states she has a family hx of DVTs but that she has never had one herself. Pt describes her pain like a bad menstrual cramp. Pt has pain in both her groin and her lower left extremity.

## 2012-07-03 NOTE — ED Notes (Signed)
Patient transported to Ultrasound 

## 2012-07-03 NOTE — ED Provider Notes (Signed)
Medical screening examination/treatment/procedure(s) were conducted as a shared visit with non-physician practitioner(s) and myself.  I personally evaluated the patient during the encounter.  Patient has left lower leg cellulitis. Discussion with patient reveals that she has had previous cellulitis resulting in significant sepsis. Patient is also diabetic. Symptoms are worsening to her. Evaluation here in the ER and therefore she'll require hospitalization for IV antibiotic therapy.  Gilda Crease, MD 07/03/12 1438

## 2012-07-03 NOTE — Telephone Encounter (Signed)
Below note states that pt is going to ED.

## 2012-07-03 NOTE — Progress Notes (Signed)
Bilateral lower extremity venous duplex:  No evidence of DVT, superficial thrombosis, or Baker's Cyst.   

## 2012-07-03 NOTE — Telephone Encounter (Signed)
Patient Information:  Caller Name: Cheyenne Guzman  Phone: [phone]  Patient: Cheyenne Guzman, Cheyenne Guzman  Gender: Female  DOB: [birth year]  Age: 47 Years  PCP: Gershon Crane [facility])  Pregnant: No  Office Follow Up:  Does the office need to follow up with this patient?: Yes  Instructions For The Office: FYI- sent patient to ED for possible DVT assessment versus office visit since office not able to do doppler as will be needed based on symptoms.  RN Note:  See reason above for additional information.  Lower left calf is puffy as compared to other but it is definately red and warm to touch. Has had the groin pain for 1.5 days and states it is just like when her mom had a DVT.  Care advice given to go to ED now and to limit use of affected leg until it is assessed--instructed to have someone else drive if possible.  RN will notify office of disposition.  Patient will follow up as needed-she demonstrated her understanding.  Symptoms  Reason For Call & Symptoms: Called yesterday regarding pain in lower back, groin pain and low grade fever.  Urine sample left at office and it was negative.  This morning swollen in left calf, red and warm to the touch with pain in left groin as 7/10 with it radiating to lower left back and left hip.  Reviewed Health History In EMR: Yes  Reviewed Medications In EMR: Yes  Reviewed Allergies In EMR: Yes  Reviewed Surgeries / Procedures: Yes  Date of Onset of Symptoms: 06/30/2012 OB / GYN:  LMP: Unknown  Guideline(s) Used:  Leg Swelling and Edema  Disposition Per Guideline:   Go to ED Now (or to Office with PCP Approval)  Reason For Disposition Reached:   Thigh or calf pain and only 1 side and present > 1 hour  Advice Given:  N/A  Patient Will Follow Care Advice:  YES

## 2012-07-04 ENCOUNTER — Encounter (HOSPITAL_COMMUNITY): Payer: Self-pay | Admitting: *Deleted

## 2012-07-04 DIAGNOSIS — L0291 Cutaneous abscess, unspecified: Secondary | ICD-10-CM

## 2012-07-04 DIAGNOSIS — R609 Edema, unspecified: Secondary | ICD-10-CM

## 2012-07-04 LAB — GLUCOSE, CAPILLARY
Glucose-Capillary: 186 mg/dL — ABNORMAL HIGH (ref 70–99)
Glucose-Capillary: 131 mg/dL — ABNORMAL HIGH (ref 70–99)
Glucose-Capillary: 231 mg/dL — ABNORMAL HIGH (ref 70–99)

## 2012-07-04 MED ORDER — CLINDAMYCIN HCL 300 MG PO CAPS: 300.0000 mg | ORAL_CAPSULE | Freq: Three times a day (TID) | ORAL | Status: DC

## 2012-07-04 MED ORDER — POLYETHYLENE GLYCOL 3350 17 G PO PACK
17.0000 g | PACK | Freq: Every day | ORAL | Status: DC
  Administered 2012-07-04 – 2012-07-05 (×2): 17 g via ORAL
  Filled 2012-07-04 (×3): qty 1

## 2012-07-04 MED ORDER — INSULIN ASPART 100 UNIT/ML ~~LOC~~ SOLN
0.0000 [IU] | Freq: Every day | SUBCUTANEOUS | Status: DC
  Administered 2012-07-04: 2 [IU] via SUBCUTANEOUS

## 2012-07-04 MED ORDER — INSULIN ASPART 100 UNIT/ML ~~LOC~~ SOLN
0.0000 [IU] | Freq: Three times a day (TID) | SUBCUTANEOUS | Status: DC
  Administered 2012-07-05: 3 [IU] via SUBCUTANEOUS

## 2012-07-04 NOTE — Progress Notes (Signed)
Quick Note:  I spoke with pt ______ 

## 2012-07-04 NOTE — Discharge Summary (Addendum)
Physician Discharge Summary  Cheyenne Guzman ION:[id number] DOB: 07-09-1965 DOA: 07/03/2012  PCP: Nelwyn Salisbury, MD  Admit date: 07/03/2012 Discharge date: 07/05/2012  Time spent: 30 minutes  Recommendations for Outpatient Follow-up:  1. 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 female who presents with left leg/groin pain since this am. Subjective fevers/chills/sweats for 2 days. Bitten by an insect a few days ago. Found to have left leg cellulitis in the ED, progressively worsening during ED stay. Doppler legs negative. Vitals and WBC ok. Diabetic. No previous h/o similar. Given vancomycin in ED.   Hospital Course:  The patient was continued on vancomycin with notable improvement in erythema and swelling by the following day. The patient remained afebrile with no leukocytosis. No drug reactions. The patient is medically stable for discharge with close outpatient follow up and will continue an additional 10 days of PO clindamycin.  Procedures:  Bilateral LE dopplers negative for DVT on 07/03/12   Discharge Exam: Filed Vitals:   07/04/12 0553 07/04/12 1500 07/04/12 2251 07/05/12 0638  BP: 130/79 134/65 124/62 130/64  Pulse: 76 72 62 64  Temp: 97.8 F (36.6 C) 97.5 F (36.4 C) 97.1 F (36.2 C) 97.9 F (36.6 C)  TempSrc:      Resp: 18 20 18 18   Height:      Weight:      SpO2: 99% 99% 100% 100%    General: Awake, in NAD Cardiovascular: regular, s1, s2 Respiratory: normal resp effort, no crackles Skin: Improving erythema over LLE  Discharge  Instructions       Future Appointments Provider Department Dept Phone   07/08/2012 1:00 PM Nelwyn Salisbury, MD [facility] [phone]       Medication List    TAKE these medications       albuterol (2.5 MG/3ML) 0.083% nebulizer solution  Commonly known as:  PROVENTIL  Take 2.5 mg by nebulization every 4 (four) hours as needed for wheezing.     albuterol 108 (90 BASE) MCG/ACT inhaler  Commonly known as:  PROVENTIL HFA;VENTOLIN HFA  Inhale 2 puffs into the lungs every 4 (four) hours as needed for wheezing or shortness of breath.     aspirin 325 MG EC tablet  Take 325 mg by mouth daily.     doxycycline 100 MG tablet  Commonly known as:  VIBRA-TABS  Take 1 tablet (100 mg total) by mouth every 12 (twelve) hours.     furosemide 40 MG tablet  Commonly known as:  LASIX  Take 1 tablet (40 mg total) by mouth 2 (two) times daily as needed. As needed for swelling.     insulin lispro 100 UNIT/ML injection  Commonly known as:  HUMALOG  Inject 50-75 Units into the skin 3 (three) times daily with meals. Based on carbohydrates.  Usually takes 50 units with breakfast (yogurt & fruit) and lunch (salad), and 75 units with dinner (or heavier meal)     insulin NPH 100 UNIT/ML injection  Commonly known as:  HUMULIN N,NOVOLIN N  Inject  75 Units into the skin 3 (three) times daily with meals.     metFORMIN 500 MG tablet  Commonly known as:  GLUCOPHAGE  Take 1,000 mg by mouth daily after supper.     multivitamin with minerals Tabs  Take 1 tablet by mouth at bedtime.     sertraline 50 MG tablet  Commonly known as:  ZOLOFT  Take 50 mg by mouth daily.     simvastatin 40 MG tablet  Commonly known as:  ZOCOR  Take 40 mg by mouth every evening.       No Known Allergies Follow-up Information   Schedule an appointment as soon as possible for a visit with Nelwyn Salisbury, MD.   Contact information:   [address] [place] [address] [phone]         The results of significant diagnostics from this hospitalization (including imaging, microbiology, ancillary and laboratory) are listed below for reference.    Significant Diagnostic Studies: No results found.  Microbiology: Recent Results (from the past 240 hour(s))  CULTURE, BLOOD (ROUTINE X 2)     Status: None   Collection Time    07/03/12  2:10 PM      Result Value Range Status   Specimen Description BLOOD LEFT ANTECUBITAL   Final   Special Requests BOTTLES DRAWN AEROBIC AND ANAEROBIC 5CC   Final   Culture  Setup Time 07/03/2012 17:38   Final   Culture     Final   Value:        BLOOD CULTURE RECEIVED NO GROWTH TO DATE CULTURE WILL BE HELD FOR 5 DAYS BEFORE ISSUING A FINAL NEGATIVE REPORT   Report Status PENDING   Incomplete  CULTURE, BLOOD (ROUTINE X 2)     Status: None   Collection Time    07/03/12  2:26 PM      Result Value Range Status   Specimen Description BLOOD RIGHT HAND   Final   Special Requests BOTTLES DRAWN AEROBIC ONLY 8CC   Final   Culture  Setup Time 07/03/2012 17:38   Final   Culture     Final   Value:        BLOOD CULTURE RECEIVED NO GROWTH TO DATE CULTURE WILL BE HELD FOR 5 DAYS BEFORE ISSUING A FINAL NEGATIVE REPORT   Report Status PENDING   Incomplete     Labs: Basic Metabolic Panel:  Recent Labs Lab 07/03/12 1044  NA 138  K 4.7  CL 102  CO2 28  GLUCOSE 182*  BUN 16  CREATININE 1.02  CALCIUM 9.2   Liver Function Tests: No results found for this basename: AST, ALT, ALKPHOS, BILITOT, PROT, ALBUMIN,  in the last 168 hours No results found for this basename: LIPASE, AMYLASE,  in the last 168 hours No results found for this basename: AMMONIA,  in the last 168 hours CBC:  Recent Labs Lab 07/03/12 1044 07/05/12 0605  WBC 7.6 6.9  NEUTROABS  --  4.2  HGB 13.5 12.5  HCT 41.5 39.0  MCV 81.4 82.1  PLT 195 189   Cardiac Enzymes: No results found for this basename: CKTOTAL, CKMB, CKMBINDEX, TROPONINI,  in the last 168 hours BNP: BNP  (last 3 results) No results found for this basename: PROBNP,  in the last 8760 hours CBG:  Recent Labs Lab 07/04/12 1124 07/04/12 1605 07/04/12 2146 07/05/12 0652 07/05/12 1127  GLUCAP 131* 109* 231* 137* 158*       Signed:  CHIU, STEPHEN K  [facility]  07/05/2012, 3:04 PM

## 2012-07-04 NOTE — Discharge Summary (Deleted)
Physician Discharge Summary  Cheyenne Guzman AVW:[id number] DOB: Oct 29, 1965 DOA: 07/03/2012  PCP: Nelwyn Salisbury, MD  Admit date: 07/03/2012 Discharge date: 07/04/2012  Time spent: 30 minutes  Recommendations for Outpatient Follow-up:  1. 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 female who presents with left leg/groin pain since this am. Subjective fevers/chills/sweats for 2 days. Bitten by an insect a few days ago. Found to have left leg cellulitis in the ED, progressively worsening during ED stay. Doppler legs negative. Vitals and WBC ok. Diabetic. No previous h/o similar. Given vancomycin in ED.   Hospital Course:  The patient was continued on vancomycin with notable improvement in erythema and swelling by the following day. The patient remained afebrile with no leukocytosis. No drug reactions. The patient is medically stable for discharge with close outpatient follow up and will continue an additional 10 days of PO clindamycin.  Procedures:  Bilateral LE dopplers negative for DVT on 07/03/12   Discharge Exam: Filed Vitals:   07/03/12 1219 07/03/12 1557 07/03/12 2211 07/04/12 0553  BP: 113/49 97/43 126/71 130/79  Pulse: 83 73 79 76  Temp:  98.3 F (36.8 C) 98.6 F (37 C) 97.8 F (36.6 C)  TempSrc:      Resp: 18 13 18 18   Height:  5' 7.5" (1.715 m)    Weight:  176.449 kg (389 lb)    SpO2: 98% 97% 94% 99%    General: Awake, in NAD Cardiovascular: regular, s1, s2 Respiratory: normal resp effort, no crackles Skin: Improving erythema over  LLE  Discharge Instructions     Medication List    TAKE these medications       albuterol (2.5 MG/3ML) 0.083% nebulizer solution  Commonly known as:  PROVENTIL  Take 2.5 mg by nebulization every 4 (four) hours as needed for wheezing.     albuterol 108 (90 BASE) MCG/ACT inhaler  Commonly known as:  PROVENTIL HFA;VENTOLIN HFA  Inhale 2 puffs into the lungs every 4 (four) hours as needed for wheezing or shortness of breath.     aspirin 325 MG EC tablet  Take 325 mg by mouth daily.     clindamycin 300 MG capsule  Commonly known as:  CLEOCIN  Take 1 capsule (300 mg total) by mouth 3 (three) times daily.     furosemide 40 MG tablet  Commonly known as:  LASIX  Take 1 tablet (40 mg total) by mouth 2 (two) times daily as needed. As needed for swelling.     insulin lispro 100 UNIT/ML injection  Commonly known as:  HUMALOG  Inject 50-75 Units into the skin 3 (three) times daily with meals. Based on carbohydrates.  Usually takes 50 units with breakfast (yogurt & fruit) and lunch (salad), and 75 units with dinner (or heavier meal)     insulin NPH 100 UNIT/ML injection  Commonly known as:  HUMULIN N,NOVOLIN N  Inject 75 Units into the skin 3 (three) times daily with meals.     metFORMIN 500 MG tablet  Commonly known as:  GLUCOPHAGE  Take 1,000 mg by mouth daily after supper.     multivitamin with minerals Tabs  Take 1 tablet by mouth at bedtime.     sertraline 50 MG tablet  Commonly known as:  ZOLOFT  Take 50 mg by mouth daily.     simvastatin 40 MG tablet  Commonly known as:  ZOCOR  Take 40 mg by mouth every evening.       No Known Allergies     Follow-up Information   Schedule an appointment as soon as possible for a visit with Nelwyn Salisbury, MD.   Contact information:   [address] [place] [address] [phone]        The results of significant diagnostics from this hospitalization (including imaging, microbiology, ancillary and laboratory) are  listed below for reference.    Significant Diagnostic Studies: No results found.  Microbiology: Recent Results (from the past 240 hour(s))  CULTURE, BLOOD (ROUTINE X 2)     Status: None   Collection Time    07/03/12  2:10 PM      Result Value Range Status   Specimen Description BLOOD LEFT ANTECUBITAL   Final   Special Requests BOTTLES DRAWN AEROBIC AND ANAEROBIC 5CC   Final   Culture  Setup Time 07/03/2012 17:38   Final   Culture     Final   Value:        BLOOD CULTURE RECEIVED NO GROWTH TO DATE CULTURE WILL BE HELD FOR 5 DAYS BEFORE ISSUING A FINAL NEGATIVE REPORT   Report Status PENDING   Incomplete  CULTURE, BLOOD (ROUTINE X 2)     Status: None   Collection Time    07/03/12  2:26 PM      Result Value Range Status   Specimen Description BLOOD RIGHT HAND   Final   Special Requests BOTTLES DRAWN AEROBIC ONLY 8CC   Final   Culture  Setup Time 07/03/2012 17:38   Final   Culture     Final   Value:        BLOOD CULTURE RECEIVED NO GROWTH TO DATE CULTURE WILL BE HELD FOR 5 DAYS BEFORE ISSUING A FINAL NEGATIVE REPORT   Report Status PENDING   Incomplete     Labs: Basic Metabolic Panel:  Recent Labs Lab 07/03/12 1044  NA 138  K 4.7  CL 102  CO2 28  GLUCOSE 182*  BUN 16  CREATININE 1.02  CALCIUM 9.2   Liver Function Tests: No results found for this basename: AST, ALT, ALKPHOS, BILITOT, PROT, ALBUMIN,  in the last 168 hours No results found for this basename: LIPASE, AMYLASE,  in the last 168 hours No results found for this basename: AMMONIA,  in the last 168 hours CBC:  Recent Labs Lab 07/03/12 1044  WBC 7.6  HGB 13.5  HCT 41.5  MCV 81.4  PLT 195   Cardiac Enzymes: No results found for this basename: CKTOTAL, CKMB, CKMBINDEX, TROPONINI,  in the last 168 hours BNP: BNP (last 3 results) No results found for this basename: PROBNP,  in the last 8760 hours CBG:  Recent Labs Lab 07/03/12 1609 07/03/12 2226 07/04/12 0642  GLUCAP 162* 108* 186*        Signed:  CHIU, STEPHEN K  [facility] 07/04/2012, 9:19 AM

## 2012-07-04 NOTE — Progress Notes (Signed)
[facility] PROGRESS NOTE  Cheyenne Guzman ZOX:[id number] DOB: [birth year] DOA: 07/03/2012 PCP: Nelwyn Salisbury, MD  Assessment/Plan: Cellulitis: - Pt with initially improved erythema and swelling - Sx later worsened when vanc was stopped in preparation for possible d/c - Will therefore continue IV abx for now and cont to observe DM: -will continue current home insulin regimen -Will add ssi coverage DVT prophylaxis: -On lovenox for prophylaxis  Code Status: Full Family Communication: Pt and family in room (indicate person spoken with, relationship, and if by phone, the number) Disposition Plan: Pending   Consultants:    Procedures:    Antibiotics:  Vancomycin 07/03/12  Ciprofloxacin 07/03/12  HPI/Subjective: Pt reports worsening pain and redness to the LLE. No fevers over night.  Objective: Filed Vitals:   07/03/12 1557 07/03/12 2211 07/04/12 0553 07/04/12 1500  BP: 97/43 126/71 130/79 134/65  Pulse: 73 79 76 72  Temp: 98.3 F (36.8 C) 98.6 F (37 C) 97.8 F (36.6 C) 97.5 F (36.4 C)  TempSrc:      Resp: 13 18 18 20   Height: 5' 7.5" (1.715 m)     Weight: 176.449 kg (389 lb)     SpO2: 97% 94% 99% 99%    Intake/Output Summary (Last 24 hours) at 07/04/12 1629 Last data filed at 07/04/12 0900  Gross per 24 hour  Intake    920 ml  Output      0 ml  Net    920 ml   Filed Weights   07/03/12 1557  Weight: 176.449 kg (389 lb)    Exam:   General:  Awake, in nad  Cardiovascular: regular, s1, s2  Respiratory: normal resp effort, no wheezing or crackles  Abdomen: soft, obese, nondistended  Musculoskeletal: Perfused, no clubbing or cyanosis   Data Reviewed: Basic Metabolic Panel:  Recent Labs Lab 07/03/12 1044  NA 138  K 4.7  CL 102  CO2 28  GLUCOSE 182*  BUN 16  CREATININE 1.02  CALCIUM 9.2   Liver Function Tests: No results found for this basename: AST, ALT, ALKPHOS, BILITOT, PROT, ALBUMIN,  in the last 168 hours No results found for  this basename: LIPASE, AMYLASE,  in the last 168 hours No results found for this basename: AMMONIA,  in the last 168 hours CBC:  Recent Labs Lab 07/03/12 1044  WBC 7.6  HGB 13.5  HCT 41.5  MCV 81.4  PLT 195   Cardiac Enzymes: No results found for this basename: CKTOTAL, CKMB, CKMBINDEX, TROPONINI,  in the last 168 hours BNP (last 3 results) No results found for this basename: PROBNP,  in the last 8760 hours CBG:  Recent Labs Lab 07/03/12 1609 07/03/12 2226 07/04/12 0642 07/04/12 1124 07/04/12 1605  GLUCAP 162* 108* 186* 131* 109*    Recent Results (from the past 240 hour(s))  CULTURE, BLOOD (ROUTINE X 2)     Status: None   Collection Time    07/03/12  2:10 PM      Result Value Range Status   Specimen Description BLOOD LEFT ANTECUBITAL   Final   Special Requests BOTTLES DRAWN AEROBIC AND ANAEROBIC 5CC   Final   Culture  Setup Time 07/03/2012 17:38   Final   Culture     Final   Value:        BLOOD CULTURE RECEIVED NO GROWTH TO DATE CULTURE WILL BE HELD FOR 5 DAYS BEFORE ISSUING A FINAL NEGATIVE REPORT   Report Status PENDING   Incomplete  CULTURE, BLOOD (ROUTINE X 2)  Status: None   Collection Time    07/03/12  2:26 PM      Result Value Range Status   Specimen Description BLOOD RIGHT HAND   Final   Special Requests BOTTLES DRAWN AEROBIC ONLY 8CC   Final   Culture  Setup Time 07/03/2012 17:38   Final   Culture     Final   Value:        BLOOD CULTURE RECEIVED NO GROWTH TO DATE CULTURE WILL BE HELD FOR 5 DAYS BEFORE ISSUING A FINAL NEGATIVE REPORT   Report Status PENDING   Incomplete     Studies: No results found.  Scheduled Meds: . aspirin EC  81 mg Oral Daily  . ciprofloxacin  400 mg Intravenous Q12H  . enoxaparin  40 mg Subcutaneous Q24H  . insulin aspart  50 Units Subcutaneous TID WC  . insulin NPH  75 Units Subcutaneous TID WC  . metFORMIN  1,000 mg Oral QPC supper  . sertraline  50 mg Oral Daily  . simvastatin  40 mg Oral QPM  . vancomycin  1,500 mg  Intravenous Q12H  . 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 If 7PM-7AM, please contact night-coverage at [email or web address], password [facility] 07/04/2012, 4:29 PM  LOS: 1 day

## 2012-07-05 LAB — CBC WITH DIFFERENTIAL/PLATELET
Eosinophils Absolute: 0.2 10*3/uL (ref 0.0–0.7)
Eosinophils Relative: 3 % (ref 0–5)
HCT: 39 % (ref 36.0–46.0)
Hemoglobin: 12.5 g/dL (ref 12.0–15.0)
Lymphocytes Relative: 27 % (ref 12–46)
Lymphs Abs: 1.9 10*3/uL (ref 0.7–4.0)
MCH: 26.3 pg (ref 26.0–34.0)
MCV: 82.1 fL (ref 78.0–100.0)
Monocytes Absolute: 0.6 10*3/uL (ref 0.1–1.0)
Monocytes Relative: 9 % (ref 3–12)
Platelets: 189 10*3/uL (ref 150–400)
RBC: 4.75 MIL/uL (ref 3.87–5.11)
WBC: 6.9 10*3/uL (ref 4.0–10.5)

## 2012-07-05 LAB — GLUCOSE, CAPILLARY

## 2012-07-05 MED ORDER — DOXYCYCLINE HYCLATE 100 MG PO TABS: 100.0000 mg | ORAL_TABLET | Freq: Two times a day (BID) | ORAL | Status: DC

## 2012-07-05 MED ORDER — DOXYCYCLINE HYCLATE 100 MG PO TABS
100.0000 mg | ORAL_TABLET | Freq: Two times a day (BID) | ORAL | Status: DC
  Administered 2012-07-05: 100 mg via ORAL
  Filled 2012-07-05 (×2): qty 1

## 2012-07-05 NOTE — Progress Notes (Signed)
UR Completed Charlize Hathaway Graves-Bigelow, RN,BSN [phone]  

## 2012-07-05 NOTE — Progress Notes (Signed)
Patient DC'd in stable condition via wheelchair with Script. No  Questions.

## 2012-07-08 ENCOUNTER — Encounter: Payer: Self-pay | Admitting: Family Medicine

## 2012-07-08 ENCOUNTER — Ambulatory Visit (INDEPENDENT_AMBULATORY_CARE_PROVIDER_SITE_OTHER): Payer: BC Managed Care – PPO | Admitting: Family Medicine

## 2012-07-08 VITALS — BP 140/80 | HR 98 | Temp 98.1°F | Wt >= 6400 oz

## 2012-07-08 DIAGNOSIS — L03116 Cellulitis of left lower limb: Secondary | ICD-10-CM

## 2012-07-08 DIAGNOSIS — L02419 Cutaneous abscess of limb, unspecified: Secondary | ICD-10-CM

## 2012-07-08 DIAGNOSIS — L03119 Cellulitis of unspecified part of limb: Secondary | ICD-10-CM

## 2012-07-08 NOTE — Progress Notes (Signed)
  Subjective:    Patient ID: Cheyenne Guzman, female    DOB: [birth year], 47 y.o.   MRN: [id number]  HPI Here to follow up a hospital stay from 07-03-12 to 07-05-12 for cellulitis in the left leg from an insect bite. She received 2 days of IV Vancomycin, and then was sent home on 7 days of Doxycycline. She feels back to normal now. No pain or swelling in the leg. No fevers.    Review of Systems  Constitutional: Negative.   Musculoskeletal: Negative.   Skin: Negative.        Objective:   Physical Exam  Constitutional: She appears well-developed and well-nourished. No distress.  Skin:  The left leg has no edema, no warmth, no erythema, and no tenderness           Assessment & Plan:  She is recovering from cellulitis. Finish out the [employer]

## 2012-07-09 LAB — CULTURE, BLOOD (ROUTINE X 2): Culture: NO GROWTH

## 2012-07-12 IMAGING — CT CT ABD-PELV W/ CM
2 of 4 series · 17 of 46 positions shown, 19 images · IV contrast (APPLIED)
Comparison: Renal ultrasound 09/10/2009.

CLINICAL DATA: 43-year-old female with right upper quadrant pain,
nausea, sepsis.

CT ABDOMEN AND PELVIS WITH CONTRAST
TECHNIQUE: Multidetector CT imaging of the abdomen and pelvis was
performed following the standard protocol during bolus
administration of intravenous contrast.
Contrast: 125 ml 0mnipaque-RUU.

[Series 2: abd_pel 5.0 b40f st · axial · 0.83mm/px · z∈[-374,+122]mm · 14 of 109 slices shown, 16 images]
[im 5/109  soft-tissue]
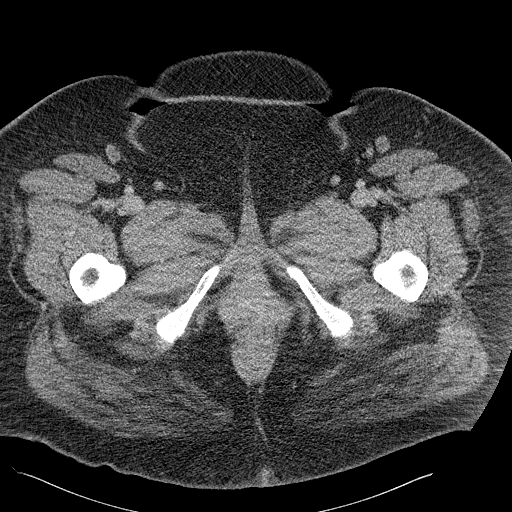
[im 5/109  bone]
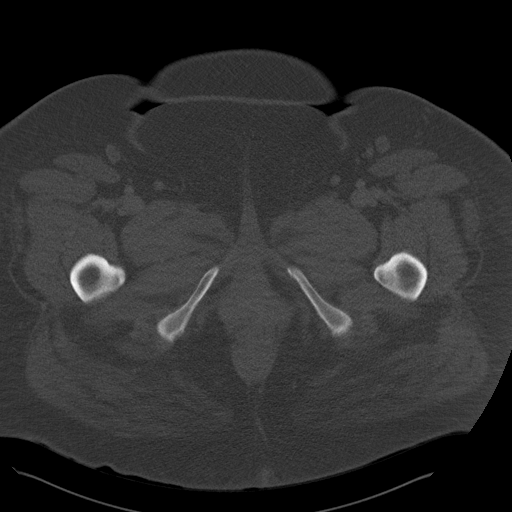
[im 14/109  soft-tissue]
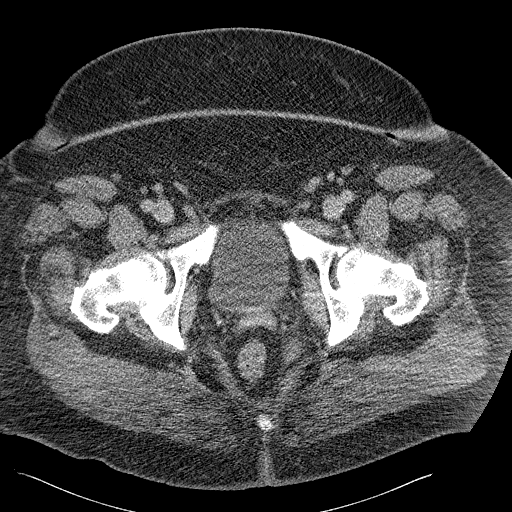
[im 23/109  soft-tissue]
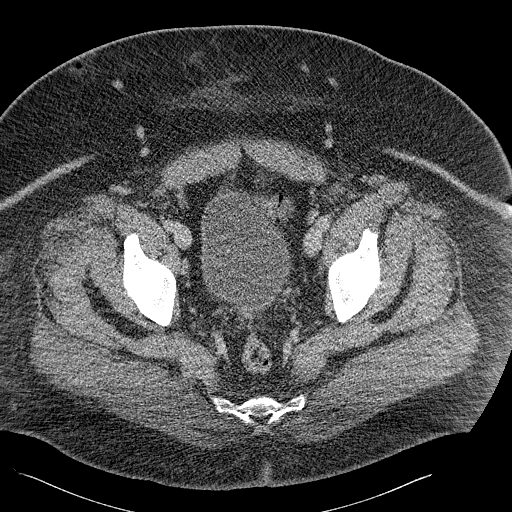
[im 28/109  soft-tissue]
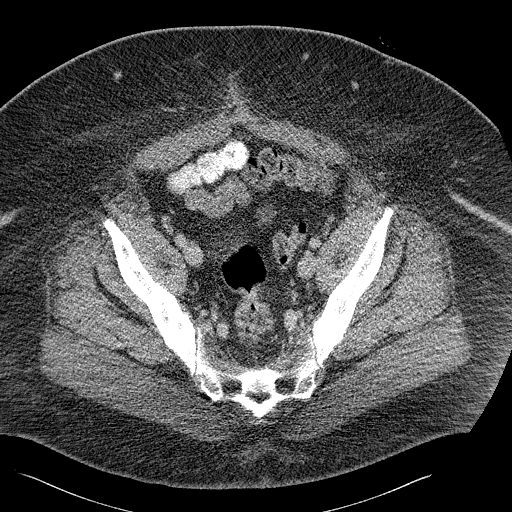
[im 37/109  soft-tissue]
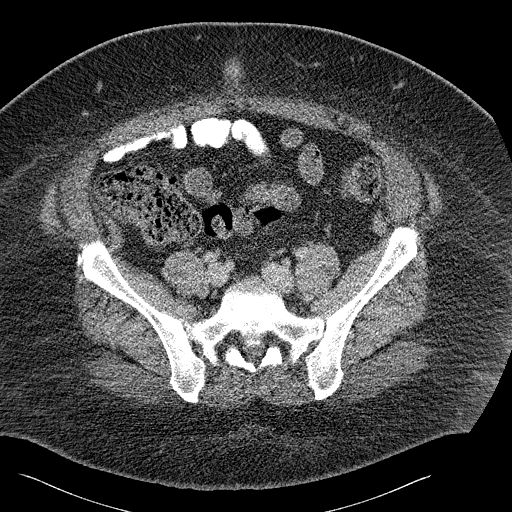
[im 46/109  soft-tissue]
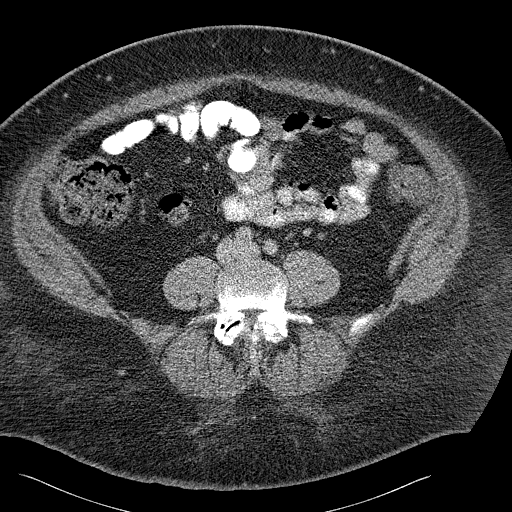
[im 50/109  soft-tissue]
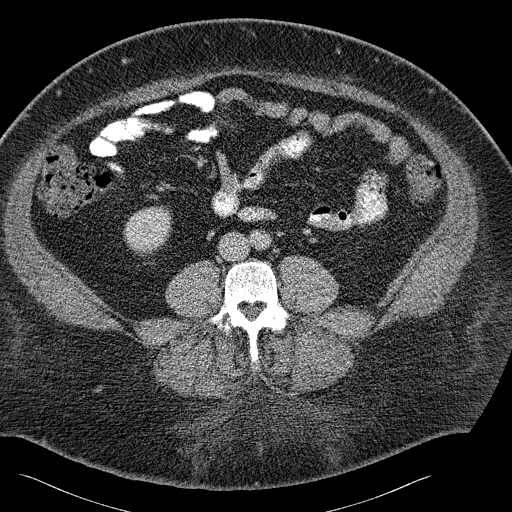
[im 59/109  soft-tissue]
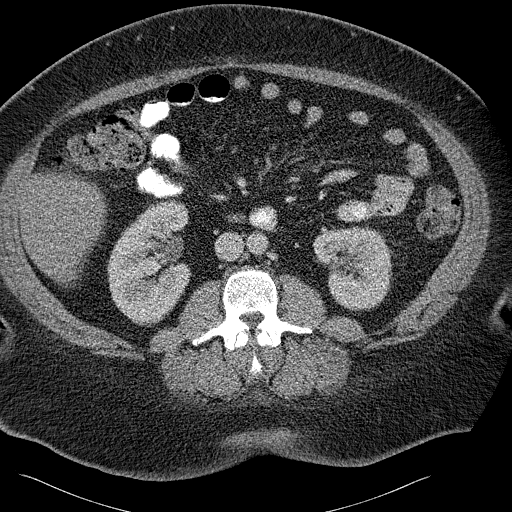
[im 64/109  soft-tissue]
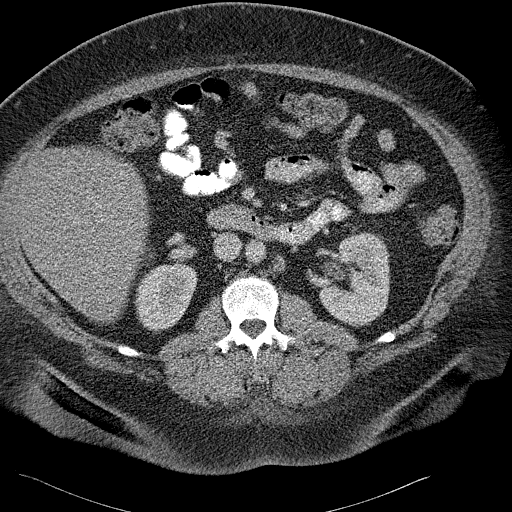
[im 64/109  bone]
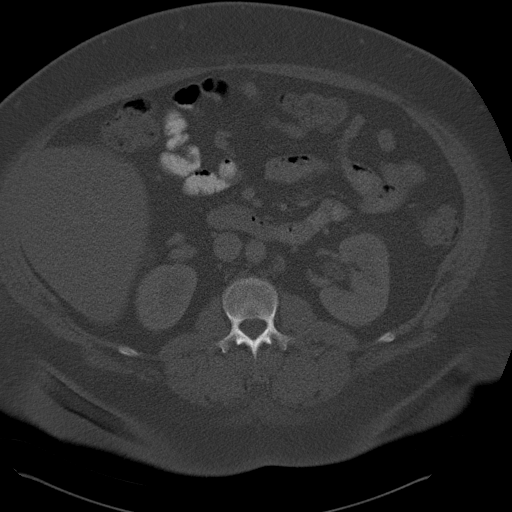
[im 73/109  soft-tissue]
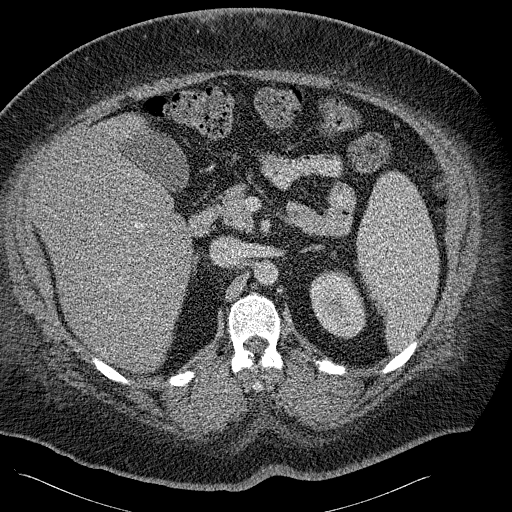
[im 82/109  soft-tissue]
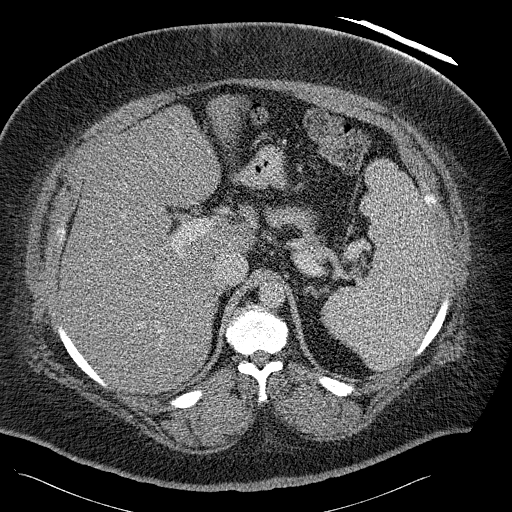
[im 86/109  soft-tissue]
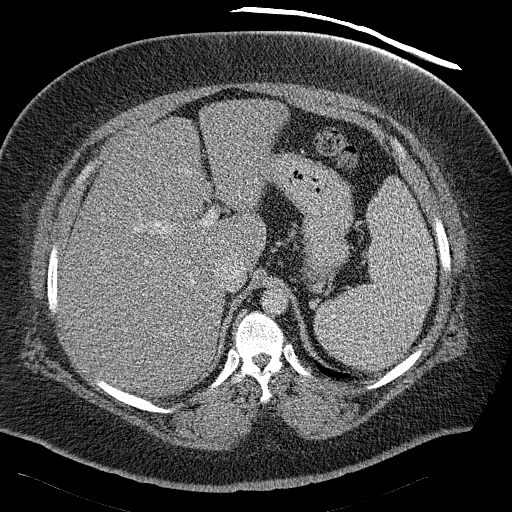
[im 95/109  soft-tissue]
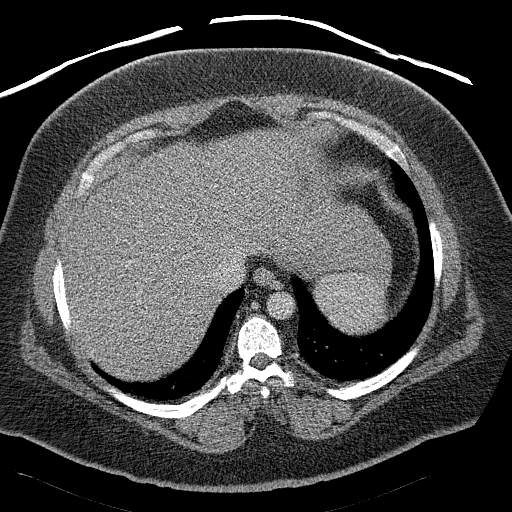
[im 104/109  soft-tissue]
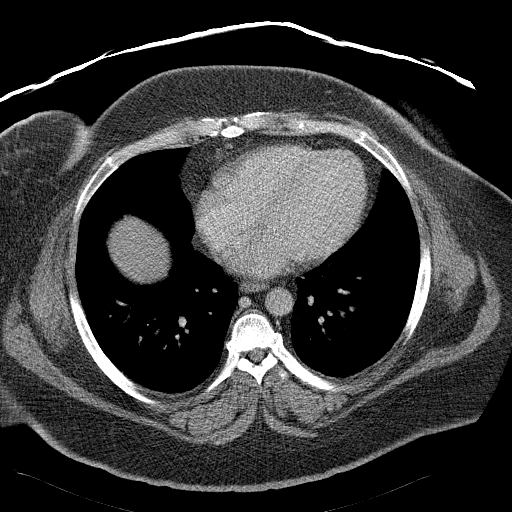

[Series 602: coronal · coronal · 1.10mm/px · 3 of 109 slices shown]
[im 37/109  soft-tissue]
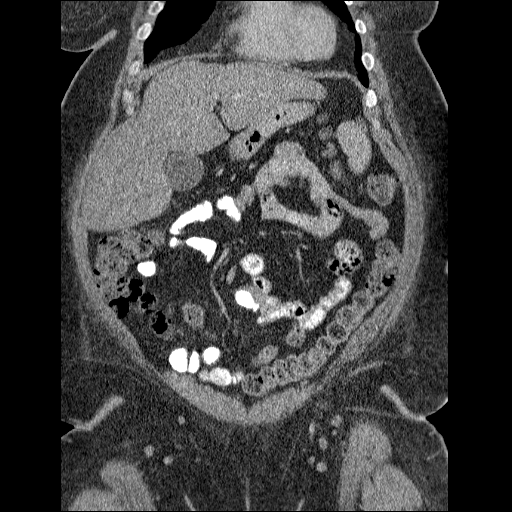
[im 49/109  soft-tissue]
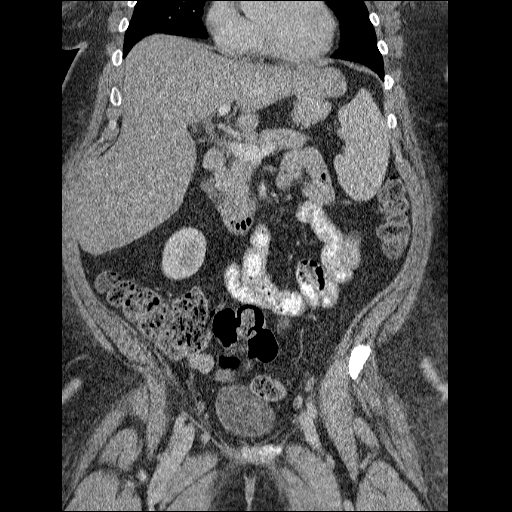
[im 61/109  soft-tissue]
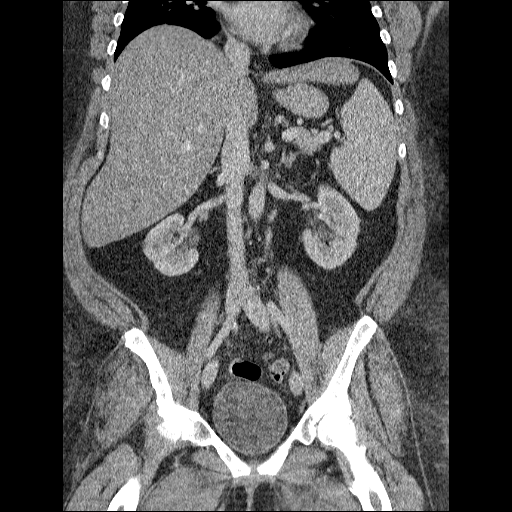

[17 of 46 positions shown; findings below may reference images not displayed]

FINDINGS: Minor dependent atelectasis.  No pericardial or pleural
effusion.  Lumbar spine degenerative changes, severe lower lumbar
facet arthropathy. No acute osseous abnormality identified.
Retained stool throughout the colon.  Normal appendix.  Oral
contrast has not yet reached the terminal ileum.  No dilated small
bowel.  Small retrocrural lymph nodes or distal esophageal
diverticulum (series 2 image 13).  Stomach decompressed.  Duodenum
within normal limits.  Mild diffuse decreased density of the liver.
Gallbladder within normal limits.  Splenomegaly, splenic length
17.6 cm.  Main portal vein measures 15 mm in diameter.  Portal
venous system patent.  Pancreas, adrenal glands, and kidneys are
within normal limits.  Major arterial structures are patent.  No
free fluid.  Bladder within normal limits.  Uterus and adnexa
appear surgically absent.
IMPRESSION: 1.  No focal inflammatory changes in the abdomen.  Normal appendix.
2.  Hepatic steatosis.  Splenomegaly. Main portal vein diameter of
15 mm suggestive of portal venous hypertension.

## 2012-07-15 ENCOUNTER — Telehealth: Payer: Self-pay | Admitting: Family Medicine

## 2012-07-15 MED ORDER — SIMVASTATIN 40 MG PO TABS: 40.0000 mg | ORAL_TABLET | Freq: Every evening | ORAL | Status: DC

## 2012-07-15 NOTE — Telephone Encounter (Signed)
I sent 90 day supply to [facility] and called in a 30 day supply to local [facility].

## 2012-07-15 NOTE — Telephone Encounter (Signed)
Pt needs refill of simvastatin (ZOCOR) 40 MG tablet Pt states she is out of refills, needs new RX. Pt is out of this med and needs one month supply [facility] 90 day supply sent to [facility]

## 2012-09-29 ENCOUNTER — Telehealth: Payer: Self-pay | Admitting: Family Medicine

## 2012-09-29 MED ORDER — SERTRALINE HCL 50 MG PO TABS: 50.0000 mg | ORAL_TABLET | Freq: Every day | ORAL | Status: DC

## 2012-09-29 NOTE — Telephone Encounter (Signed)
Okay for one year  

## 2012-09-29 NOTE — Telephone Encounter (Signed)
I sent script e-scribe. 

## 2012-09-29 NOTE — Telephone Encounter (Signed)
Refill request for Sertraline 50 mg and a 90 day supply to [facility].

## 2012-11-09 ENCOUNTER — Emergency Department (INDEPENDENT_AMBULATORY_CARE_PROVIDER_SITE_OTHER)
Admission: EM | Admit: 2012-11-09 | Discharge: 2012-11-09 | Disposition: A | Discharge status: Home / Self Care | Payer: BC Managed Care – PPO | Source: Home / Self Care

## 2012-11-09 ENCOUNTER — Emergency Department (INDEPENDENT_AMBULATORY_CARE_PROVIDER_SITE_OTHER): Payer: BC Managed Care – PPO

## 2012-11-09 ENCOUNTER — Encounter (HOSPITAL_COMMUNITY): Payer: Self-pay | Admitting: Emergency Medicine

## 2012-11-09 DIAGNOSIS — R0982 Postnasal drip: Secondary | ICD-10-CM

## 2012-11-09 DIAGNOSIS — S92919A Unspecified fracture of unspecified toe(s), initial encounter for closed fracture: Secondary | ICD-10-CM

## 2012-11-09 DIAGNOSIS — S92501A Displaced unspecified fracture of right lesser toe(s), initial encounter for closed fracture: Secondary | ICD-10-CM

## 2012-11-09 DIAGNOSIS — J029 Acute pharyngitis, unspecified: Secondary | ICD-10-CM

## 2012-11-09 LAB — POCT RAPID STREP A: Streptococcus, Group A Screen (Direct): NEGATIVE

## 2012-11-09 NOTE — ED Provider Notes (Signed)
CSN: [id number]     Arrival date & time 11/09/12  1753 History   First MD Initiated Contact with Patient 11/09/12 1831     Chief Complaint  Patient presents with  . Toe Pain  . Sore Throat   (Consider location/radiation/quality/duration/timing/severity/associated sxs/prior Treatment) HPI Comments: 47 year old morbidly obese female presents with 2 complaints. The first complaint is that of pain in the right fifth toe after falling and striking it on the kick board and in a hyper abducting it. This occurred 2 weeks ago. The pain is still present and there is persistent swelling. It is worse with ambulation.  The second complaint is that of a sore throat for 2 days. It is associated with PND, nasal congestion. Denies cough or shortness of breath or fever. She is complaining of malaise.    Past Medical History  Diagnosis Date  . Depression   . Hyperlipidemia   . TIA (transient ischemic attack)     in 2007,2008, and 2011  . Urosepsis 08-2009    with Klebsiella  . Breast mass in female   . Stroke   . Asthma     seasonal   . Diabetes mellitus     sees Dr. Talmage Nap, diagnosed 2003  . Kidney infection     - hosp. [facility]- septic- 10/2009  . Cancer     breast, sees Dr. Darnelle Catalan , Left - 11/2007  . Arthritis     back & ankles   . Anxiety     per pt. - mild   . Peripheral neuropathy     R sided numbness- face & jaw   Past Surgical History  Procedure Laterality Date  . Foot surgery      plantar fasciitis  . Nerve graft  06-01-08    cadaver graft to right inferior alveolar nerve  in [place]   . Abdominal hysterectomy  08/2004  . Breast surgery  11/2007    left ductal carcinoma in situ  . Breast biopsy  06/22/2011    Procedure: BREAST BIOPSY;  Surgeon: Almond Lint, MD;  Location: [facility] OR;  Service: General;  Laterality: Right;   Family History  Problem Relation Age of Onset  . Cancer Mother     breast  . Cancer Father     lung  . Cancer Sister     colon  . Cancer Maternal Aunt    breast - both  . Anesthesia problems Neg Hx    History  Substance Use Topics  . Smoking status: Former Smoker    Quit date: 02/18/1997  . Smokeless tobacco: Never Used  . Alcohol Use: Yes     Comment: rare   OB History   Grav Para Term Preterm Abortions TAB SAB Ect Mult Living                 Review of Systems  Constitutional: Positive for activity change. Negative for fever, chills, appetite change and fatigue.       Malaise  HENT: Positive for congestion, postnasal drip, rhinorrhea and sore throat. Negative for facial swelling, sinus pressure and trouble swallowing.   Eyes: Negative.   Respiratory: Negative.  Negative for cough and shortness of breath.   Cardiovascular: Negative.   Gastrointestinal: Negative.   Genitourinary: Negative.   Musculoskeletal: Negative for joint swelling, neck pain and neck stiffness.  Skin: Negative for pallor and rash.  Neurological: Negative.     Allergies  Review of patient's allergies indicates no known allergies.  Home Medications   Current  Outpatient Rx  Name  Route  Sig  Dispense  Refill  . albuterol (PROVENTIL HFA;VENTOLIN HFA) 108 (90 BASE) MCG/ACT inhaler   Inhalation   Inhale 2 puffs into the lungs every 4 (four) hours as needed for wheezing or shortness of breath.         Marland Kitchen albuterol (PROVENTIL) (2.5 MG/3ML) 0.083% nebulizer solution   Nebulization   Take 2.5 mg by nebulization every 4 (four) hours as needed for wheezing.         Marland Kitchen aspirin 325 MG EC tablet   Oral   Take 325 mg by mouth daily.          Marland Kitchen doxycycline (VIBRA-TABS) 100 MG tablet   Oral   Take 1 tablet (100 mg total) by mouth every 12 (twelve) hours.   14 tablet   0   . furosemide (LASIX) 40 MG tablet   Oral   Take 1 tablet (40 mg total) by mouth 2 (two) times daily as needed. As needed for swelling.   60 tablet   11   . insulin lispro (HUMALOG) 100 UNIT/ML injection   Subcutaneous   Inject 50-75 Units into the skin 3 (three) times daily with  meals. Based on carbohydrates.  Usually takes 50 units with breakfast (yogurt & fruit) and lunch (salad), and 75 units with dinner (or heavier meal)         . metFORMIN (GLUCOPHAGE) 500 MG tablet   Oral   Take 1,000 mg by mouth daily after supper.          . Multiple Vitamin (MULTIVITAMIN WITH MINERALS) TABS   Oral   Take 1 tablet by mouth at bedtime.         . sertraline (ZOLOFT) 50 MG tablet   Oral   Take 1 tablet (50 mg total) by mouth daily.   90 tablet   3   . simvastatin (ZOCOR) 40 MG tablet   Oral   Take 1 tablet (40 mg total) by mouth every evening.   90 tablet   2    BP 152/94  Pulse 86  Temp(Src) 99.6 F (37.6 C) (Oral)  Resp 22  SpO2 97% Physical Exam  Nursing note and vitals reviewed. Constitutional: She is oriented to person, place, and time. She appears well-developed and well-nourished. No distress.  HENT:  Head: Normocephalic and atraumatic.  Right Ear: External ear normal.  Left Ear: External ear normal.  Minor erythema to the oropharynx with vertical streaking. No exudates, swelling.  Eyes: EOM are normal. Pupils are equal, round, and reactive to light.  Neck: Normal range of motion. Neck supple.  Cardiovascular: Normal rate, regular rhythm and normal heart sounds.   Pulmonary/Chest: Effort normal and breath sounds normal. No respiratory distress. She has no wheezes.  Musculoskeletal: Normal range of motion.  Tenderness to the right fifth digit. Minor swelling. No deformity. Distal  sensation is intact.  Lymphadenopathy:    She has no cervical adenopathy.  Neurological: She is alert and oriented to person, place, and time. No cranial nerve deficit.  Skin: Skin is warm and dry. No rash noted.  Psychiatric: She has a normal mood and affect.    ED Course  Procedures (including critical care time) Labs Review Labs Reviewed  POCT RAPID STREP A ([facility]RG CARE ONLY)   Imaging Review Dg Toe 5th Right  11/09/2012   CLINICAL DATA:  Injury, 5th  toe pain.  EXAM: RIGHT FIFTH TOE  COMPARISON:  None.  FINDINGS: There is  a fracture at the base of the right little toe proximal phalanx a. probable extension into the MTP joint. No additional acute bony abnormality.  IMPRESSION: Nondisplaced fracture at the base of the right little toe proximal phalanx with probable intra-articular extension.   Electronically Signed   By: Charlett Nose M.D.   On: 11/09/2012 19:26      MDM   1. Fracture of fifth toe, right, closed, initial encounter   2. Pharyngitis   3. PND (post-nasal drip)        buddy tape the right fourth and fifth toes. Hard sole shoe. Keep elevated and apply ice as needed. And followup with Dr. Wandra Feinstein. Pelvis with an appointment. The primary reason for followup of his toe fractures is that it extends into the joint space. Claritin or Allegra daily for PND. Cepacol lozenges or Chloraseptic spray. If no contraindications may take ibuprofen for sore throat. Followup with your PCP as needed.   Hayden Rasmussen, NP 11/09/12 [phone]

## 2012-11-09 NOTE — ED Provider Notes (Signed)
Medical screening examination/treatment/procedure(s) were performed by non-physician practitioner and as supervising physician I was immediately available for consultation/collaboration.  Leslee Home, M.D.  Reuben Likes, MD 11/09/12 [phone]

## 2012-11-09 NOTE — ED Notes (Signed)
Multiple complaints.  Sore throat for one week.  Reports fall 2 weeks ago, toe slid under cabinet and remained painful after fall.  Pain has not improved, pain in toe has worsened

## 2012-11-11 LAB — CULTURE, GROUP A STREP

## 2012-11-27 ENCOUNTER — Ambulatory Visit (INDEPENDENT_AMBULATORY_CARE_PROVIDER_SITE_OTHER): Payer: BC Managed Care – PPO | Admitting: Family Medicine

## 2012-11-27 ENCOUNTER — Observation Stay (HOSPITAL_COMMUNITY)
Admission: EM | Admit: 2012-11-27 | Discharge: 2012-11-28 | Disposition: A | Discharge status: Home / Self Care | Payer: BC Managed Care – PPO | Attending: Internal Medicine | Admitting: Internal Medicine

## 2012-11-27 ENCOUNTER — Other Ambulatory Visit: Payer: Self-pay

## 2012-11-27 ENCOUNTER — Encounter: Payer: Self-pay | Admitting: Family Medicine

## 2012-11-27 ENCOUNTER — Encounter (HOSPITAL_COMMUNITY): Payer: Self-pay | Admitting: Emergency Medicine

## 2012-11-27 ENCOUNTER — Emergency Department (HOSPITAL_COMMUNITY): Payer: BC Managed Care – PPO

## 2012-11-27 VITALS — BP 120/76 | HR 85 | Temp 97.9°F | Resp 12

## 2012-11-27 DIAGNOSIS — E1169 Type 2 diabetes mellitus with other specified complication: Secondary | ICD-10-CM | POA: Diagnosis present

## 2012-11-27 DIAGNOSIS — F411 Generalized anxiety disorder: Secondary | ICD-10-CM | POA: Diagnosis present

## 2012-11-27 DIAGNOSIS — N6310 Unspecified lump in the right breast, unspecified quadrant: Secondary | ICD-10-CM

## 2012-11-27 DIAGNOSIS — L03116 Cellulitis of left lower limb: Secondary | ICD-10-CM

## 2012-11-27 DIAGNOSIS — B379 Candidiasis, unspecified: Secondary | ICD-10-CM

## 2012-11-27 DIAGNOSIS — E785 Hyperlipidemia, unspecified: Secondary | ICD-10-CM

## 2012-11-27 DIAGNOSIS — M545 Low back pain, unspecified: Secondary | ICD-10-CM

## 2012-11-27 DIAGNOSIS — R609 Edema, unspecified: Secondary | ICD-10-CM

## 2012-11-27 DIAGNOSIS — F329 Major depressive disorder, single episode, unspecified: Secondary | ICD-10-CM

## 2012-11-27 DIAGNOSIS — Z8673 Personal history of transient ischemic attack (TIA), and cerebral infarction without residual deficits: Secondary | ICD-10-CM | POA: Insufficient documentation

## 2012-11-27 DIAGNOSIS — R079 Chest pain, unspecified: Principal | ICD-10-CM

## 2012-11-27 DIAGNOSIS — Z23 Encounter for immunization: Secondary | ICD-10-CM | POA: Insufficient documentation

## 2012-11-27 DIAGNOSIS — N390 Urinary tract infection, site not specified: Secondary | ICD-10-CM

## 2012-11-27 DIAGNOSIS — F3289 Other specified depressive episodes: Secondary | ICD-10-CM

## 2012-11-27 DIAGNOSIS — N12 Tubulo-interstitial nephritis, not specified as acute or chronic: Secondary | ICD-10-CM

## 2012-11-27 DIAGNOSIS — J45909 Unspecified asthma, uncomplicated: Secondary | ICD-10-CM

## 2012-11-27 DIAGNOSIS — R0602 Shortness of breath: Secondary | ICD-10-CM

## 2012-11-27 DIAGNOSIS — F419 Anxiety disorder, unspecified: Secondary | ICD-10-CM | POA: Diagnosis present

## 2012-11-27 DIAGNOSIS — R109 Unspecified abdominal pain: Secondary | ICD-10-CM

## 2012-11-27 DIAGNOSIS — F32A Depression, unspecified: Secondary | ICD-10-CM | POA: Diagnosis present

## 2012-11-27 DIAGNOSIS — Z87891 Personal history of nicotine dependence: Secondary | ICD-10-CM | POA: Insufficient documentation

## 2012-11-27 DIAGNOSIS — E119 Type 2 diabetes mellitus without complications: Secondary | ICD-10-CM

## 2012-11-27 DIAGNOSIS — J209 Acute bronchitis, unspecified: Secondary | ICD-10-CM

## 2012-11-27 DIAGNOSIS — G459 Transient cerebral ischemic attack, unspecified: Secondary | ICD-10-CM

## 2012-11-27 LAB — PRO B NATRIURETIC PEPTIDE: Pro B Natriuretic peptide (BNP): 55.3 pg/mL (ref 0–125)

## 2012-11-27 LAB — POCT I-STAT TROPONIN I: Troponin i, poc: 0 ng/mL (ref 0.00–0.08)

## 2012-11-27 LAB — CBC
HCT: 42.9 % (ref 36.0–46.0)
Hemoglobin: 13.9 g/dL (ref 12.0–15.0)
MCH: 26.1 pg (ref 26.0–34.0)
MCHC: 32.4 g/dL (ref 30.0–36.0)
MCV: 80.6 fL (ref 78.0–100.0)
Platelets: 199 K/uL (ref 150–400)
RBC: 5.32 MIL/uL — ABNORMAL HIGH (ref 3.87–5.11)
RDW: 16.2 % — ABNORMAL HIGH (ref 11.5–15.5)
WBC: 7.2 K/uL (ref 4.0–10.5)

## 2012-11-27 LAB — BASIC METABOLIC PANEL
BUN: 11 mg/dL (ref 6–23)
Calcium: 9.3 mg/dL (ref 8.4–10.5)
Creatinine, Ser: 0.76 mg/dL (ref 0.50–1.10)
GFR calc Af Amer: >90 mL/min (ref >90)
GFR calc non Af Amer: >90 mL/min (ref >90)
Potassium: 4.7 mEq/L (ref 3.5–5.1)

## 2012-11-27 LAB — BASIC METABOLIC PANEL WITH GFR
CO2: 26 meq/L (ref 19–32)
Chloride: 102 meq/L (ref 96–112)
Glucose, Bld: 213 mg/dL — ABNORMAL HIGH (ref 70–99)
Sodium: 138 meq/L (ref 135–145)

## 2012-11-27 LAB — TROPONIN I: Troponin I: <0.3 ng/mL (ref <0.30)

## 2012-11-27 LAB — GLUCOSE, CAPILLARY: Glucose-Capillary: 223 mg/dL — ABNORMAL HIGH (ref 70–99)

## 2012-11-27 MED ORDER — ONDANSETRON HCL 4 MG PO TABS: 4.0000 mg | ORAL_TABLET | Freq: Four times a day (QID) | ORAL | Status: DC | PRN

## 2012-11-27 MED ORDER — ASPIRIN 81 MG PO CHEW
324.0000 mg | CHEWABLE_TABLET | Freq: Once | ORAL | Status: AC
  Administered 2012-11-27: 324 mg via ORAL
  Filled 2012-11-27: qty 4

## 2012-11-27 MED ORDER — ALBUTEROL SULFATE (5 MG/ML) 0.5% IN NEBU: 2.5000 mg | INHALATION_SOLUTION | Freq: Four times a day (QID) | RESPIRATORY_TRACT | Status: DC | PRN

## 2012-11-27 MED ORDER — INSULIN GLARGINE 100 UNIT/ML ~~LOC~~ SOLN
40.0000 [IU] | Freq: Every day | SUBCUTANEOUS | Status: DC
  Administered 2012-11-27: 40 [IU] via SUBCUTANEOUS
  Filled 2012-11-27 (×2): qty 0.4

## 2012-11-27 MED ORDER — ACETAMINOPHEN 325 MG PO TABS: 650.0000 mg | ORAL_TABLET | Freq: Four times a day (QID) | ORAL | Status: DC | PRN

## 2012-11-27 MED ORDER — PNEUMOCOCCAL VAC POLYVALENT 25 MCG/0.5ML IJ INJ
0.5000 mL | INJECTION | INTRAMUSCULAR | Status: AC
  Administered 2012-11-28: 0.5 mL via INTRAMUSCULAR
  Filled 2012-11-27: qty 0.5

## 2012-11-27 MED ORDER — ONDANSETRON HCL 4 MG/2ML IJ SOLN: 4.0000 mg | Freq: Four times a day (QID) | INTRAMUSCULAR | Status: DC | PRN

## 2012-11-27 MED ORDER — ALBUTEROL SULFATE HFA 108 (90 BASE) MCG/ACT IN AERS
2.0000 | INHALATION_SPRAY | Freq: Four times a day (QID) | RESPIRATORY_TRACT | Status: DC | PRN
  Filled 2012-11-27: qty 6.7

## 2012-11-27 MED ORDER — ENOXAPARIN SODIUM 40 MG/0.4ML ~~LOC~~ SOLN
40.0000 mg | Freq: Every day | SUBCUTANEOUS | Status: DC
  Administered 2012-11-28: 40 mg via SUBCUTANEOUS
  Filled 2012-11-27: qty 0.4

## 2012-11-27 MED ORDER — SIMVASTATIN 40 MG PO TABS
40.0000 mg | ORAL_TABLET | Freq: Every evening | ORAL | Status: DC
  Administered 2012-11-27: 40 mg via ORAL
  Filled 2012-11-27 (×2): qty 1

## 2012-11-27 MED ORDER — SERTRALINE HCL 50 MG PO TABS
50.0000 mg | ORAL_TABLET | Freq: Every day | ORAL | Status: DC
  Administered 2012-11-27: 50 mg via ORAL
  Filled 2012-11-27 (×2): qty 1

## 2012-11-27 MED ORDER — ASPIRIN EC 325 MG PO TBEC
325.0000 mg | DELAYED_RELEASE_TABLET | Freq: Every day | ORAL | Status: DC
  Administered 2012-11-28: 325 mg via ORAL
  Filled 2012-11-27: qty 1

## 2012-11-27 MED ORDER — ASPIRIN EC 325 MG PO TBEC: 325.0000 mg | DELAYED_RELEASE_TABLET | Freq: Every day | ORAL | Status: DC

## 2012-11-27 MED ORDER — MORPHINE SULFATE 2 MG/ML IJ SOLN: 1.0000 mg | INTRAMUSCULAR | Status: DC | PRN

## 2012-11-27 MED ORDER — NITROGLYCERIN 0.4 MG SL SUBL: 0.4000 mg | SUBLINGUAL_TABLET | SUBLINGUAL | Status: DC | PRN

## 2012-11-27 MED ORDER — INFLUENZA VAC SPLIT QUAD 0.5 ML IM SUSP
0.5000 mL | INTRAMUSCULAR | Status: AC
  Administered 2012-11-28: 0.5 mL via INTRAMUSCULAR
  Filled 2012-11-27: qty 0.5

## 2012-11-27 MED ORDER — SODIUM CHLORIDE 0.9 % IJ SOLN
3.0000 mL | Freq: Two times a day (BID) | INTRAMUSCULAR | Status: DC
  Administered 2012-11-27 – 2012-11-28 (×2): 3 mL via INTRAVENOUS

## 2012-11-27 MED ORDER — INSULIN ASPART 100 UNIT/ML ~~LOC~~ SOLN
0.0000 [IU] | Freq: Three times a day (TID) | SUBCUTANEOUS | Status: DC
  Administered 2012-11-27 – 2012-11-28 (×2): 3 [IU] via SUBCUTANEOUS

## 2012-11-27 MED ORDER — FUROSEMIDE 40 MG PO TABS
40.0000 mg | ORAL_TABLET | Freq: Every day | ORAL | Status: DC
  Administered 2012-11-27 – 2012-11-28 (×2): 40 mg via ORAL
  Filled 2012-11-27 (×2): qty 1

## 2012-11-27 MED ORDER — ACETAMINOPHEN 650 MG RE SUPP: 650.0000 mg | Freq: Four times a day (QID) | RECTAL | Status: DC | PRN

## 2012-11-27 MED ORDER — KETOROLAC TROMETHAMINE 30 MG/ML IJ SOLN
30.0000 mg | Freq: Once | INTRAMUSCULAR | Status: AC
  Administered 2012-11-27: 30 mg via INTRAVENOUS
  Filled 2012-11-27: qty 1

## 2012-11-27 MED ORDER — ONDANSETRON HCL 4 MG/2ML IJ SOLN: 4.0000 mg | Freq: Three times a day (TID) | INTRAMUSCULAR | Status: AC | PRN

## 2012-11-27 MED ORDER — ADULT MULTIVITAMIN W/MINERALS CH
1.0000 | ORAL_TABLET | Freq: Every day | ORAL | Status: DC
  Administered 2012-11-27: 1 via ORAL
  Filled 2012-11-27 (×2): qty 1

## 2012-11-27 NOTE — ED Provider Notes (Signed)
CSN: [id number]     Arrival date & time 11/27/12  1038 History   First MD Initiated Contact with Patient 11/27/12 1104     Chief Complaint  Patient presents with  . Chest Pain  . Shortness of Breath   (Consider location/radiation/quality/duration/timing/severity/associated sxs/prior Treatment) HPI Comments: Patient is a 47 year old female with a past medical history of diabetes, previous TIA, and  Hyperlipidemia who presents with a 3 day history of chest pain. Patient reports symptoms started gradually and progressively worsened since the onset. Patient describes the pain as a "tightness" that is severe and does not radiate. Patient reports associated headache and SOB. Her symptoms are worse with any exertion. No aggravating/alleviating factors. Patient went to her PCP this morning who recommended she come to the ED.    Past Medical History  Diagnosis Date  . Depression   . Hyperlipidemia   . TIA (transient ischemic attack)     in 2007,2008, and 2011  . Urosepsis 08-2009    with Klebsiella  . Breast mass in female   . Stroke   . Asthma     seasonal   . Diabetes mellitus     sees Dr. Talmage Nap, diagnosed 2003  . Kidney infection     - hosp. [facility]- septic- 10/2009  . Cancer     breast, sees Dr. Darnelle Catalan , Left - 11/2007  . Arthritis     back & ankles   . Anxiety     per pt. - mild   . Peripheral neuropathy     R sided numbness- face & jaw   Past Surgical History  Procedure Laterality Date  . Foot surgery      plantar fasciitis  . Nerve graft  06-01-08    cadaver graft to right inferior alveolar nerve  in [place]   . Abdominal hysterectomy  08/2004  . Breast surgery  11/2007    left ductal carcinoma in situ  . Breast biopsy  06/22/2011    Procedure: BREAST BIOPSY;  Surgeon: Almond Lint, MD;  Location: [facility] OR;  Service: General;  Laterality: Right;   Family History  Problem Relation Age of Onset  . Cancer Mother     breast  . Cancer Father     lung  . Cancer Sister     colon   . Cancer Maternal Aunt     breast - both  . Anesthesia problems Neg Hx    History  Substance Use Topics  . Smoking status: Former Smoker    Quit date: 02/18/1997  . Smokeless tobacco: Never Used  . Alcohol Use: Yes     Comment: rare   OB History   Grav Para Term Preterm Abortions TAB SAB Ect Mult Living                 Review of Systems  Respiratory: Positive for chest tightness and shortness of breath.   All other systems reviewed and are negative.    Allergies  Review of patient's allergies indicates no known allergies.  Home Medications   Current Outpatient Rx  Name  Route  Sig  Dispense  Refill  . albuterol (PROVENTIL HFA;VENTOLIN HFA) 108 (90 BASE) MCG/ACT inhaler   Inhalation   Inhale 2 puffs into the lungs every 4 (four) hours as needed for wheezing or shortness of breath.         Marland Kitchen albuterol (PROVENTIL) (2.5 MG/3ML) 0.083% nebulizer solution   Nebulization   Take 2.5 mg by nebulization every 4 (  four) hours as needed for wheezing.         Marland Kitchen aspirin 325 MG EC tablet   Oral   Take 325 mg by mouth at bedtime.          . furosemide (LASIX) 40 MG tablet   Oral   Take 1 tablet (40 mg total) by mouth 2 (two) times daily as needed. As needed for swelling.   60 tablet   11   . insulin lispro (HUMALOG) 100 UNIT/ML injection   Subcutaneous   Inject 75 Units into the skin 3 (three) times daily with meals.          . insulin NPH (HUMULIN N,NOVOLIN N) 100 UNIT/ML injection   Subcutaneous   Inject 75 Units into the skin 3 (three) times daily before meals.         . metFORMIN (GLUCOPHAGE) 500 MG tablet   Oral   Take 1,000 mg by mouth daily after supper.          . Multiple Vitamin (MULTIVITAMIN WITH MINERALS) TABS   Oral   Take 1 tablet by mouth at bedtime.         . sertraline (ZOLOFT) 50 MG tablet   Oral   Take 50 mg by mouth at bedtime.         . simvastatin (ZOCOR) 40 MG tablet   Oral   Take 1 tablet (40 mg total) by mouth every  evening.   90 tablet   2    BP 131/82  Pulse 82  Temp(Src) 98.4 F (36.9 C) (Oral)  Resp 18  SpO2 98% Physical Exam  Nursing note and vitals reviewed. Constitutional: She is oriented to person, place, and time. She appears well-developed and well-nourished. No distress.  HENT:  Head: Normocephalic and atraumatic.  Eyes: Conjunctivae and EOM are normal. Pupils are equal, round, and reactive to light.  Neck: Normal range of motion.  Cardiovascular: Normal rate, regular rhythm and intact distal pulses.  Exam reveals no gallop and no friction rub.   No murmur heard. Pulmonary/Chest: Effort normal and breath sounds normal. She has no wheezes. She has no rales. She exhibits no tenderness.  Abdominal: Soft. She exhibits no distension. There is no tenderness. There is no rebound and no guarding.  Musculoskeletal: Normal range of motion.  Post op shoe on right foot from recent toe fracture. No calf tenderness bilaterally. Right lower leg edema >left lower leg edema.   Neurological: She is alert and oriented to person, place, and time. Coordination normal.  Speech is goal-oriented. Moves limbs without ataxia.   Skin: Skin is warm and dry.  Psychiatric: She has a normal mood and affect. Her behavior is normal.    ED Course  Procedures (including critical care time)   Date: 11/27/2012  Rate: 82  Rhythm: normal sinus rhythm  QRS Axis: left  Intervals: normal  ST/T Wave abnormalities: normal  Conduction Disutrbances:none  Narrative Interpretation: NSR unchanged from previous   Old EKG Reviewed: unchanged    Labs Review Labs Reviewed  CBC - Abnormal; Notable for the following:    RBC 5.32 (*)    RDW 16.2 (*)    All other components within normal limits  BASIC METABOLIC PANEL  PRO B NATRIURETIC PEPTIDE   Imaging Review Dg Chest 2 View  11/27/2012   CLINICAL DATA:  Chest pain and shortness of breath  EXAM: CHEST  2 VIEW  COMPARISON:  October 16, 2010  FINDINGS: There is no  edema or  consolidation. The heart size and pulmonary vascularity are within normal limits. No adenopathy. No bone lesions.  IMPRESSION: No edema or consolidation.   Electronically Signed   By: Bretta Bang M.D.   On: 11/27/2012 11:39    EKG Interpretation     Ventricular Rate:  82 PR Interval:  150 QRS Duration: 84 QT Interval:  370 QTC Calculation: 432 R Axis:   -8 Text Interpretation:  Normal sinus rhythm Poor R wave progression Borderline ECG No significant change since last tracing            MDM   1. Chest pain   2. SOB (shortness of breath)     11:47 AM Labs pending. Chest xray shows no acute changes. Vitals stable and patient afebrile.   1:01 PM Labs and troponin unremarkable for acute changes. Patient will have aspirin and nitro. D-dimer pending. Patient will likely be admitted due to history and risk factors.   Patient's d-dimer negative. Patient will be admitted to Dr. Cameron Ali for further evaluation of symptoms.   Emilia Beck, PA-C 11/27/12 1610

## 2012-11-27 NOTE — Consult Note (Signed)
Reason for Consult: Chest pain Referring Physician: [facility] hospitalist  Cheyenne Guzman is an 47 y.o. female.  HPI: Patient is 47 year old female with past medical history significant for hypertension, hypercholesteremia, history of TIA, morbid obesity, remote tobacco abuse, smoked one to 2 packs per day for 15 years quit 17 years ago, was admitted because of retrosternal chest pressure and grade 8/10 localized for last 2-3 days. States chest pressure increases with deep breathing. Denies any shortness of breath but complains of exertional dyspnea. Denies any palpitation lightheadedness or syncope. Denies nausea vomiting diaphoresis. Denies any weakness in the arms or legs or any facial droop. States she had left-sided facial drooping 2008 and 2009 and was told to have TIA had CT of the brain which were negative. Patient also complains of left shoulder musculoskeletal pain. EKG done in the ER showed poor R-wave progression with no acute ischemic changes. Facet of troponin I is negative. Patient also had D. dimers and pro BNP. which were in normal range. Patient denies any history of PND orthopnea but complains of mild leg swelling states recently was treated for cellulitis  Past Medical History  Diagnosis Date  . Depression   . Hyperlipidemia   . TIA (transient ischemic attack)     in 2007,2008, and 2011  . Urosepsis 08-2009    with Klebsiella  . Breast mass in female   . Stroke   . Asthma     seasonal   . Diabetes mellitus     sees Dr. Talmage Nap, diagnosed 2003  . Kidney infection     - hosp. [facility]- septic- 10/2009  . Cancer     breast, sees Dr. Darnelle Catalan , Left - 11/2007  . Arthritis     back & ankles   . Anxiety     per pt. - mild   . Peripheral neuropathy     R sided numbness- face & jaw    Past Surgical History  Procedure Laterality Date  . Foot surgery      plantar fasciitis  . Nerve graft  06-01-08    cadaver graft to right inferior alveolar nerve  in [place]   . Abdominal  hysterectomy  08/2004  . Breast surgery  11/2007    left ductal carcinoma in situ  . Breast biopsy  06/22/2011    Procedure: BREAST BIOPSY;  Surgeon: Almond Lint, MD;  Location: [facility] OR;  Service: General;  Laterality: Right;    Family History  Problem Relation Age of Onset  . Cancer Mother     breast  . Cancer Father     lung  . Cancer Sister     colon  . Cancer Maternal Aunt     breast - both  . Anesthesia problems Neg Hx     Social History:  reports that she quit smoking about 15 years ago. She has never used smokeless tobacco. She reports that she drinks alcohol. She reports that she does not use illicit drugs.  Allergies: No Known Allergies  Medications: I have reviewed the patient's current medications.  Results for orders placed during the hospital encounter of 11/27/12 (from the past 48 hour(s))  CBC     Status: Abnormal   Collection Time    11/27/12 10:57 AM      Result Value Range   WBC 7.2  4.0 - 10.5 K/uL   RBC 5.32 (*) 3.87 - 5.11 MIL/uL   Hemoglobin 13.9  12.0 - 15.0 g/dL   HCT 16.1  09.6 - 04.5 %  MCV 80.6  78.0 - 100.0 fL   MCH 26.1  26.0 - 34.0 pg   MCHC 32.4  30.0 - 36.0 g/dL   RDW 16.1 (*) 09.6 - 04.5 %   Platelets 199  150 - 400 K/uL  BASIC METABOLIC PANEL     Status: Abnormal   Collection Time    11/27/12 10:57 AM      Result Value Range   Sodium 138  135 - 145 mEq/L   Potassium 4.7  3.5 - 5.1 mEq/L   Chloride 102  96 - 112 mEq/L   CO2 26  19 - 32 mEq/L   Glucose, Bld 213 (*) 70 - 99 mg/dL   BUN 11  6 - 23 mg/dL   Creatinine, Ser 4.09  0.50 - 1.10 mg/dL   Calcium 9.3  8.4 - 81.1 mg/dL   GFR calc non Af Amer >90  >90 mL/min   GFR calc Af Amer >90  >90 mL/min   Comment: (NOTE)     The eGFR has been calculated using the CKD EPI equation.     This calculation has not been validated in all clinical situations.     eGFR's persistently <90 mL/min signify possible Chronic Kidney     Disease.  PRO B NATRIURETIC PEPTIDE     Status: None    Collection Time    11/27/12 10:57 AM      Result Value Range   Pro B Natriuretic peptide (BNP) 55.3  0 - 125 pg/mL  POCT I-STAT TROPONIN I     Status: None   Collection Time    11/27/12 11:02 AM      Result Value Range   Troponin i, poc 0.00  0.00 - 0.08 ng/mL   Comment 3            Comment: Due to the release kinetics of cTnI,     a negative result within the first hours     of the onset of symptoms does not rule out     myocardial infarction with certainty.     If myocardial infarction is still suspected,     repeat the test at appropriate intervals.  D-DIMER, QUANTITATIVE     Status: None   Collection Time    11/27/12 12:56 PM      Result Value Range   D-Dimer, Quant 0.28  0.00 - 0.48 ug/mL-FEU   Comment:            AT THE INHOUSE ESTABLISHED CUTOFF     VALUE OF 0.48 ug/mL FEU,     THIS ASSAY HAS BEEN DOCUMENTED     IN THE LITERATURE TO HAVE     A SENSITIVITY AND NEGATIVE     PREDICTIVE VALUE OF AT LEAST     98 TO 99%.  THE TEST RESULT     SHOULD BE CORRELATED WITH     AN ASSESSMENT OF THE CLINICAL     PROBABILITY OF DVT / VTE.  GLUCOSE, CAPILLARY     Status: Abnormal   Collection Time    11/27/12  4:52 PM      Result Value Range   Glucose-Capillary 184 (*) 70 - 99 mg/dL   Comment 1 Notify RN      Dg Chest 2 View  11/27/2012   CLINICAL DATA:  Chest pain and shortness of breath  EXAM: CHEST  2 VIEW  COMPARISON:  October 16, 2010  FINDINGS: There is no edema or consolidation. The heart size and pulmonary  vascularity are within normal limits. No adenopathy. No bone lesions.  IMPRESSION: No edema or consolidation.   Electronically Signed   By: Bretta Bang M.D.   On: 11/27/2012 11:39    Review of Systems  Constitutional: Negative for fever, chills and weight loss.  Eyes: Negative for blurred vision, double vision and photophobia.  Respiratory: Positive for shortness of breath. Negative for cough, hemoptysis, sputum production and wheezing.   Cardiovascular:  Positive for chest pain and leg swelling. Negative for palpitations, orthopnea and claudication.  Gastrointestinal: Negative for nausea, vomiting, abdominal pain and diarrhea.  Neurological: Positive for headaches. Negative for dizziness.   Blood pressure 108/57, pulse 68, temperature 98.2 F (36.8 C), temperature source Oral, resp. rate 18, height 5\' 7"  (1.702 m), weight 180.7 kg (398 lb 5.9 oz), SpO2 97.00%. Physical Exam  Constitutional: She is oriented to person, place, and time.  HENT:  Head: Normocephalic and atraumatic.  Eyes: Conjunctivae are normal. Pupils are equal, round, and reactive to light. Left eye exhibits no discharge. No scleral icterus.  Neck: Normal range of motion. Neck supple. No JVD present. No tracheal deviation present. No thyromegaly present.  Cardiovascular: Exam reveals no gallop and no friction rub.   Regular rate and rhythm S1-S2 soft No obvious murmur gallop or rub noted  Respiratory: Effort normal and breath sounds normal. No respiratory distress. She has no wheezes. She has no rales.  GI: Soft. Bowel sounds are normal. She exhibits distension. There is no rebound.  Musculoskeletal:  No clubbing cyanosis 1+ edema  Neurological: She is alert and oriented to person, place, and time.    Assessment/Plan: Atypical chest pain rule out MI Non-insulin-dependent diabetes mellitus Hypercholesteremia History of TIA Morbid obesity Probable obstructive sleep apnea/obesity hypoventilation syndrome History of remote tobacco abuse Plan Agree with present management Check serial enzymes and EKG Check 2-D echo Nuclear stress test  could not be done due to  morbid obesity. And also will not pursue invasive workup as her chest pain is atypical. Discussed with patient regarding lifestyle changes and possible bariatric surgery in future patient states she is in discussion with her PMD regarding bariatric surgery  Tnia Anglada N 11/27/2012, 5:14 PM

## 2012-11-27 NOTE — Progress Notes (Signed)
  Subjective:    Patient ID: Cheyenne Guzman, female    DOB: 12-29-1965, 47 y.o.   MRN: [id number]  HPI Here with her daughter for 2 days of weakness, SOB, and pressure in the chest. No cough or fever. She has had a sharp pain in the back beneath the left shoulder blade for 5 days. She has felt lightheaded and has had a mild HA. No nausea or vomiting. The chest pressure has been constant for 48 hours, and she gets out of breath by simply walking across the room. She has a long hx of swelling in both legs, but the right leg has been more swollen then usual and she has had some pain in the right leg for about 2 weeks. Her diabetes has been well controlled until lately, and her A1c in March was 7.1. However her fasting glucose this am was 180.    Review of Systems  Constitutional: Positive for fatigue. Negative for fever, chills and diaphoresis.  Eyes: Negative.   Respiratory: Positive for chest tightness and shortness of breath. Negative for cough and wheezing.   Cardiovascular: Positive for chest pain and leg swelling. Negative for palpitations.  Gastrointestinal: Negative.   Genitourinary: Negative.   Neurological: Positive for dizziness, light-headedness and headaches. Negative for tremors, seizures, syncope, facial asymmetry, speech difficulty and numbness.       Objective:   Physical Exam  Constitutional: She is oriented to person, place, and time.  Obese, in no distress   Neck: No thyromegaly present.  Cardiovascular: Normal rate, regular rhythm, normal heart sounds and intact distal pulses.  Exam reveals no gallop and no friction rub.   No murmur heard. Pulmonary/Chest: Effort normal and breath sounds normal. No respiratory distress. She has no wheezes. She has no rales. She exhibits no tenderness.  Abdominal: Soft. Bowel sounds are normal. She exhibits no distension and no mass. There is no tenderness. There is no rebound and no guarding.  Musculoskeletal:  2+ edema of both legs,  tender in both calves   Lymphadenopathy:    She has no cervical adenopathy.  Neurological: She is alert and oriented to person, place, and time. No cranial nerve deficit. She exhibits normal muscle tone. Coordination normal.          Assessment & Plan:  It is difficult to tell what is going on with her today, but she is at high risk of heart disease and is also at risk of a PE. She has been immobile for 2 weeks after she fractured a toe on the right foot. I asked her daughter to drive her from here immediately to [facility] ER for further evaluation.

## 2012-11-27 NOTE — ED Notes (Signed)
Pt c/o chest tightness and SOB x 3 days with HA; pt sent from PCP

## 2012-11-27 NOTE — H&P (Signed)
[facility] History and Physical  Cheyenne Guzman FAO:[id number] DOB: 1966/01/01 DOA: 11/27/2012  Referring physician:ED PCP: Nelwyn Salisbury, MD   Chief complaint Chest pressure x 2 days   HPI:  47 year old morbidly obese female with history of diabetes mellitus type 2, history of TIA, hyperlipidemia, anxiety and depression who was at her PCP office today with symptoms of chest pressure for past 2 days. Also had headache with some lightheadedness. She also reported some dyspnea on exertion for past 2 days. She reports the chest pressure to be present throughout the day almost 8/10 in intensity, nonradiating, without any aggravating or relieving factors. Dislikes something choking in her throat. Denies any wheezing but does feel more short of breath on exertion. She denies any orthopnea or PND. She does that her right leg was more swollen than the left. She denies any palpitations, fever, chills, runny nose, nausea, vomiting, abdominal pain, bowel or urinary symptoms. Denies any trauma or recent travel. She does report some frontal headache and some dizziness for possible days. Denies any tingling or numbness of her extremities. Denies any weakness P. date  Course in the ED Patient's vitals were stable. EKG was unremarkable. O2 sat was within normal limits. Initial troponin was negative. Her d-dimer was checked which was unremarkable. CXR negative. Given her risk factors dry hospitalist was consulted for admission on observation to rule out ACS. Nitrate was not given due to headaches.  Review of Systems:  Constitutional: Denies fever, chills, diaphoresis, appetite change . Reports fatigue HEENT: Denies photophobia, eye pain, redness, hearing loss, ear pain, congestion, sore throat, rhinorrhea, sneezing, mouth sores, trouble swallowing, neck pain, neck stiffness and tinnitus.   Respiratory: Chest tightness with some dyspnea on exertion Denies cough,  and wheezing.   Cardiovascular:   chest  pain,  and leg swelling.  denies palpitations Gastrointestinal: Denies nausea, vomiting, abdominal pain, diarrhea, constipation, blood in stool and abdominal distention.  Genitourinary: Denies dysuria, urgency, frequency, hematuria, flank pain and difficulty urinating.  Endocrine: Denies polyuria, polydipsia. Musculoskeletal: Denies myalgias, arthralgias, gait disturbances, back pain Neurological:  Reports dizziness,Denies seizures, syncope, weakness, light-headedness, numbness and headaches.     Past Medical History  Diagnosis Date  . Depression   . Hyperlipidemia   . TIA (transient ischemic attack)     in 2007,2008, and 2011  . Urosepsis 08-2009    with Klebsiella  . Breast mass in female   . Stroke   . Asthma     seasonal   . Diabetes mellitus     sees Dr. Talmage Nap, diagnosed 2003  . Kidney infection     - hosp. [facility]- septic- 10/2009  . Cancer     breast, sees Dr. Darnelle Catalan , Left - 11/2007  . Arthritis     back & ankles   . Anxiety     per pt. - mild   . Peripheral neuropathy     R sided numbness- face & jaw   Past Surgical History  Procedure Laterality Date  . Foot surgery      plantar fasciitis  . Nerve graft  06-01-08    cadaver graft to right inferior alveolar nerve  in [place]   . Abdominal hysterectomy  08/2004  . Breast surgery  11/2007    left ductal carcinoma in situ  . Breast biopsy  06/22/2011    Procedure: BREAST BIOPSY;  Surgeon: Almond Lint, MD;  Location: [facility] OR;  Service: General;  Laterality: Right;   Social History:  reports that she quit smoking  about 15 years ago. She has never used smokeless tobacco. She reports that she drinks alcohol. She reports that she does not use illicit drugs.  No Known Allergies  Family History  Problem Relation Age of Onset  . Cancer Mother     breast  . Cancer Father     lung  . Cancer Sister     colon  . Cancer Maternal Aunt     breast - both  . Anesthesia problems Neg Hx     Prior to Admission medications    Medication Sig Start Date End Date Taking? Authorizing Provider  albuterol (PROVENTIL HFA;VENTOLIN HFA) 108 (90 BASE) MCG/ACT inhaler Inhale 2 puffs into the lungs every 4 (four) hours as needed for wheezing or shortness of breath.   Yes Historical Provider, MD  albuterol (PROVENTIL) (2.5 MG/3ML) 0.083% nebulizer solution Take 2.5 mg by nebulization every 4 (four) hours as needed for wheezing.   Yes Historical Provider, MD  aspirin 325 MG EC tablet Take 325 mg by mouth at bedtime.    Yes Historical Provider, MD  furosemide (LASIX) 40 MG tablet Take 1 tablet (40 mg total) by mouth 2 (two) times daily as needed. As needed for swelling. 05/29/12  Yes Nelwyn Salisbury, MD  insulin lispro (HUMALOG) 100 UNIT/ML injection Inject 75 Units into the skin 3 (three) times daily with meals.    Yes Historical Provider, MD  insulin NPH (HUMULIN N,NOVOLIN N) 100 UNIT/ML injection Inject 75 Units into the skin 3 (three) times daily before meals.   Yes Historical Provider, MD  metFORMIN (GLUCOPHAGE) 500 MG tablet Take 1,000 mg by mouth daily after supper.    Yes Historical Provider, MD  Multiple Vitamin (MULTIVITAMIN WITH MINERALS) TABS Take 1 tablet by mouth at bedtime.   Yes Historical Provider, MD  sertraline (ZOLOFT) 50 MG tablet Take 50 mg by mouth at bedtime.   Yes Historical Provider, MD  simvastatin (ZOCOR) 40 MG tablet Take 1 tablet (40 mg total) by mouth every evening. 07/15/12  Yes Nelwyn Salisbury, MD    Physical Exam:  Filed Vitals:   11/27/12 1315 11/27/12 1400 11/27/12 1445 11/27/12 1610  BP: 145/76 129/60 119/68 108/57  Pulse: 82 72 73 68  Temp:    98.2 F (36.8 C)  TempSrc:    Oral  Resp: 21 9 18    Height:    5\' 7"  (1.702 m)  SpO2: 96% 96% 97% 97%    Constitutional: Vital signs reviewed.  Patient is a morbidly obese female in no acute distress HEENT: No pallor, , moist oral mucosa, JVD Cardiovascular: RRR, S1 normal, S2 normal, no MRG,  Pulmonary/Chest: CTAB, no wheezes, rales, or  rhonchi Abdominal: Soft. Non-tender, non-distended, bowel sounds are normal, no masses, organomegaly, or guarding present.  Extremity: warm,  Trace edema bilaterally, no inequality in leg swellings Neurological: A&O x3, non focal  Labs on Admission:  Basic Metabolic Panel:  Recent Labs Lab 11/27/12 1057  NA 138  K 4.7  CL 102  CO2 26  GLUCOSE 213*  BUN 11  CREATININE 0.76  CALCIUM 9.3   Liver Function Tests: No results found for this basename: AST, ALT, ALKPHOS, BILITOT, PROT, ALBUMIN,  in the last 168 hours No results found for this basename: LIPASE, AMYLASE,  in the last 168 hours No results found for this basename: AMMONIA,  in the last 168 hours CBC:  Recent Labs Lab 11/27/12 1057  WBC 7.2  HGB 13.9  HCT 42.9  MCV 80.6  PLT  199   Cardiac Enzymes: No results found for this basename: CKTOTAL, CKMB, CKMBINDEX, TROPONINI,  in the last 168 hours BNP: No components found with this basename: POCBNP,  CBG: No results found for this basename: GLUCAP,  in the last 168 hours  Radiological Exams on Admission: Dg Chest 2 View  11/27/2012   CLINICAL DATA:  Chest pain and shortness of breath  EXAM: CHEST  2 VIEW  COMPARISON:  October 16, 2010  FINDINGS: There is no edema or consolidation. The heart size and pulmonary vascularity are within normal limits. No adenopathy. No bone lesions.  IMPRESSION: No edema or consolidation.   Electronically Signed   By: Bretta Bang M.D.   On: 11/27/2012 11:39    EKG: NSR@82 , no ST-T changes  Assessment/Plan   Principle problem  chest pressure Admit under observation to telemetry Rule out ACS with serial troponins and EKG Continue full dose aspirin. When necessary sublingual nitrate. IV morphine for severe pain -Check lipid panel and hemoglobin A1c -Order 2-D echo -Patient has multiple risk factors are coronary artery disease including hypertension, hyperlipidemia, history of TIA and morbid obesity. Cardiology (Dr. Janine Limbo)  consulted who will evaluate the patient for inpatient stress test. Patient is not tachycardic or hypoxic on exam. D-dimer was negative. Probably due for underlying PE is low.   Active Problems:   DIABETES MELLITUS, TYPE II I would place her on sliding scale insulin. Check hemoglobin A1c. Patient  is on high-dose NPH at home. I would place her on Lantus 40 units daily. Hold metformin.  Hyperlipidemia Continue Zocor . Lipid panel  Depression Continue sertraline     History of TIA Continue aspirin and statin  Peripheral edema Order daily Lasix Check 2-D echo. Pro BNP within normal limits     Morbid obesity    DT prophylaxis: Subcutaneous Lovenox Diet: Diabetic   Code Status: Full code Family Communication: Daughter at bedside Disposition Plan: Home once workup completed  Eddie North [facility] Pager [phone]  If 7PM-7AM, please contact night-coverage [email or web address] Password [email or web address] 11/27/2012, 4:32 PM   Time spent on admission: 70 minutes

## 2012-11-28 ENCOUNTER — Observation Stay (HOSPITAL_COMMUNITY): Payer: BC Managed Care – PPO

## 2012-11-28 ENCOUNTER — Encounter (HOSPITAL_COMMUNITY): Payer: Self-pay | Admitting: Radiology

## 2012-11-28 DIAGNOSIS — F329 Major depressive disorder, single episode, unspecified: Secondary | ICD-10-CM

## 2012-11-28 DIAGNOSIS — F411 Generalized anxiety disorder: Secondary | ICD-10-CM

## 2012-11-28 DIAGNOSIS — E785 Hyperlipidemia, unspecified: Secondary | ICD-10-CM

## 2012-11-28 DIAGNOSIS — J45909 Unspecified asthma, uncomplicated: Secondary | ICD-10-CM

## 2012-11-28 DIAGNOSIS — R079 Chest pain, unspecified: Secondary | ICD-10-CM

## 2012-11-28 LAB — LIPID PANEL
Cholesterol: 148 mg/dL (ref 0–200)
HDL: 31 mg/dL — ABNORMAL LOW (ref >39)
LDL Cholesterol: 78 mg/dL (ref 0–99)
Triglycerides: 193 mg/dL — ABNORMAL HIGH (ref <150)

## 2012-11-28 LAB — HEMOGLOBIN A1C: Mean Plasma Glucose: 174 mg/dL — ABNORMAL HIGH (ref <117)

## 2012-11-28 MED ORDER — IOHEXOL 350 MG/ML SOLN
100.0000 mL | Freq: Once | INTRAVENOUS | Status: AC | PRN
  Administered 2012-11-28: 100 mL via INTRAVENOUS

## 2012-11-28 MED ORDER — CLONAZEPAM 0.5 MG PO TABS
0.5000 mg | ORAL_TABLET | Freq: Two times a day (BID) | ORAL | Status: DC | PRN
Start: 2012-11-28 — End: 2013-06-10

## 2012-11-28 MED ORDER — LORAZEPAM 1 MG PO TABS: 1.0000 mg | ORAL_TABLET | Freq: Three times a day (TID) | ORAL | Status: DC | PRN

## 2012-11-28 MED ORDER — NITROGLYCERIN 0.4 MG SL SUBL: 0.4000 mg | SUBLINGUAL_TABLET | SUBLINGUAL | Status: DC | PRN

## 2012-11-28 MED ORDER — TRAMADOL HCL 50 MG PO TABS: 50.0000 mg | ORAL_TABLET | Freq: Four times a day (QID) | ORAL | Status: DC | PRN

## 2012-11-28 NOTE — ED Provider Notes (Signed)
Medical screening examination/treatment/procedure(s) were performed by non-physician practitioner and as supervising physician I was immediately available for consultation/collaboration.  EKG Interpretation     Ventricular Rate:  82 PR Interval:  150 QRS Duration: 84 QT Interval:  370 QTC Calculation: 432 R Axis:   -8 Text Interpretation:  Normal sinus rhythm Poor R wave progression Borderline ECG No significant change since last tracing              Gavin Pound. Cally Nygard, MD 11/28/12 1049

## 2012-11-28 NOTE — Progress Notes (Signed)
Subjective:  Patient continues to have localized pleuritic chest pressure without associated symptoms. Denies any shortness of breath. Denies cough fever or chills. No anginal chest pain. 2-D echo still pending. Cardiac enzymes as a negative  Objective:  Vital Signs in the last 24 hours: Temp:  [97.6 F (36.4 C)-98.4 F (36.9 C)] 97.6 F (36.4 C) (11/07 0625) Pulse Rate:  [68-85] 68 (11/07 0625) Resp:  [9-25] 18 (11/07 0625) BP: (103-145)/(50-82) 105/55 mmHg (11/07 0625) SpO2:  [94 %-100 %] 95 % (11/07 0625) Weight:  [180.7 kg (398 lb 5.9 oz)] 180.7 kg (398 lb 5.9 oz) (11/06 1645)  Intake/Output from previous day:   Intake/Output from this shift:    Physical Exam: Neck: no adenopathy, no carotid bruit, no JVD and supple, symmetrical, trachea midline Lungs: clear to auscultation bilaterally Heart: regular rate and rhythm, S1, S2 normal and no rub Abdomen: soft, non-tender; bowel sounds normal; no masses,  no organomegaly Extremities: extremities normal, atraumatic, no cyanosis or edema  Lab Results:  Recent Labs  11/27/12 1057  WBC 7.2  HGB 13.9  PLT 199    Recent Labs  11/27/12 1057  NA 138  K 4.7  CL 102  CO2 26  GLUCOSE 213*  BUN 11  CREATININE 0.76    Recent Labs  11/27/12 1925 11/28/12 0156  TROPONINI <0.30 <0.30   Hepatic Function Panel No results found for this basename: PROT, ALBUMIN, AST, ALT, ALKPHOS, BILITOT, BILIDIR, IBILI,  in the last 72 hours  Recent Labs  11/28/12 0156  CHOL 148   No results found for this basename: PROTIME,  in the last 72 hours  Imaging: Imaging results have been reviewed and Dg Chest 2 View  11/27/2012   CLINICAL DATA:  Chest pain and shortness of breath  EXAM: CHEST  2 VIEW  COMPARISON:  October 16, 2010  FINDINGS: There is no edema or consolidation. The heart size and pulmonary vascularity are within normal limits. No adenopathy. No bone lesions.  IMPRESSION: No edema or consolidation.   Electronically Signed    By: Bretta Bang M.D.   On: 11/27/2012 11:39    Cardiac Studies:  Assessment/Plan:  Atypical chest pain MI ruled out Non-insulin-dependent diabetes mellitus  Hypercholesteremia  History of TIA  Morbid obesity  Probable obstructive sleep apnea/obesity hypoventilation syndrome  History of remote tobacco abuse Plan Continue present management Check 2-D echo  LOS: 1 day    Sherilee Smotherman N 11/28/2012, 7:53 AM

## 2012-11-28 NOTE — Progress Notes (Signed)
Patient ID: Cheyenne Guzman  female  UJW:[id number]    DOB: March 08, 1965    DOA: 11/27/2012  PCP: Nelwyn Salisbury, MD  Assessment/Plan: Principal Problem:   Chest pain with dyspnea: Somewhat atypical, started after a stressful issue at work on Tuesday, differential diagnosis also includes panic attack, rule out cardiac. D-dimer was negative - 2-D echo done, results pending - Unfortunately cannot have the nuclear stress test due to morbid obesity - Placed on Ativan as needed for anxiety - D-dimer was negative, patient continues to complain of chest pain episode radiating to the back, hence will obtain CT angiogram of the chest to rule out any aortic dissection  Active Problems:   DIABETES MELLITUS, TYPE II: - Uncontrolled counseled patient on changing lifestyle, diet and weight control    HYPERLIPIDEMIA    ANXIETY/   DEPRESSION    Morbid obesity - Counseled on diet and weight control    DVT Prophylaxis:  Code Status:  Disposition: Likely DC home today if 2-D echo is within normal spectrum and the CT angiogram is unremarkable    Subjective: Patient feels normal now, no chest pain however feels very anxious  Objective: Weight change:  No intake or output data in the 24 hours ending 11/28/12 1149 Blood pressure 105/55, pulse 68, temperature 97.6 F (36.4 C), temperature source Oral, resp. rate 18, height 5\' 7"  (1.702 m), weight 180.7 kg (398 lb 5.9 oz), SpO2 95.00%.  Physical Exam: General: Alert and awake, oriented x3, not in any acute distress. CVS: S1-S2 clear, no murmur rubs or gallops Chest: clear to auscultation bilaterally, no wheezing, rales or rhonchi Abdomen: Morbidly obese soft nontender, nondistended, normal bowel sounds  Extremities: no cyanosis, clubbing. 1+ edema noted bilaterally Neuro: Cranial nerves II-XII intact, no focal neurological deficits  Lab Results: Basic Metabolic Panel:  Recent Labs Lab 11/27/12 1057  NA 138  K 4.7  CL 102  CO2 26  GLUCOSE  213*  BUN 11  CREATININE 0.76  CALCIUM 9.3   Liver Function Tests: No results found for this basename: AST, ALT, ALKPHOS, BILITOT, PROT, ALBUMIN,  in the last 168 hours No results found for this basename: LIPASE, AMYLASE,  in the last 168 hours No results found for this basename: AMMONIA,  in the last 168 hours CBC:  Recent Labs Lab 11/27/12 1057  WBC 7.2  HGB 13.9  HCT 42.9  MCV 80.6  PLT 199   Cardiac Enzymes:  Recent Labs Lab 11/27/12 1925 11/28/12 0156 11/28/12 0846  TROPONINI <0.30 <0.30 <0.30   BNP: No components found with this basename: POCBNP,  CBG:  Recent Labs Lab 11/27/12 1652 11/27/12 2051 11/28/12 0727  GLUCAP 184* 223* 199*     Micro Results: No results found for this or any previous visit (from the past 240 hour(s)).  Studies/Results: Dg Chest 2 View  11/27/2012   CLINICAL DATA:  Chest pain and shortness of breath  EXAM: CHEST  2 VIEW  COMPARISON:  October 16, 2010  FINDINGS: There is no edema or consolidation. The heart size and pulmonary vascularity are within normal limits. No adenopathy. No bone lesions.  IMPRESSION: No edema or consolidation.   Electronically Signed   By: Bretta Bang M.D.   On: 11/27/2012 11:39   Dg Toe 5th Right  11/09/2012   CLINICAL DATA:  Injury, 5th toe pain.  EXAM: RIGHT FIFTH TOE  COMPARISON:  None.  FINDINGS: There is a fracture at the base of the right little toe proximal phalanx a. probable extension  into the MTP joint. No additional acute bony abnormality.  IMPRESSION: Nondisplaced fracture at the base of the right little toe proximal phalanx with probable intra-articular extension.   Electronically Signed   By: Charlett Nose M.D.   On: 11/09/2012 19:26    Medications: Scheduled Meds: . aspirin EC  325 mg Oral Daily  . enoxaparin (LOVENOX) injection  40 mg Subcutaneous Daily  . furosemide  40 mg Oral Daily  . insulin aspart  0-15 Units Subcutaneous TID WC  . insulin glargine  40 Units Subcutaneous QHS   . multivitamin with minerals  1 tablet Oral QHS  . sertraline  50 mg Oral QHS  . simvastatin  40 mg Oral QPM  . sodium chloride  3 mL Intravenous Q12H      LOS: 1 day   Carver Murakami M.D. [facility] 11/28/2012, 11:49 AM Pager: [phone]  If 7PM-7AM, please contact night-coverage [email or web address] Password [email or web address]

## 2012-11-28 NOTE — Progress Notes (Signed)
UR COMPLETED  

## 2012-11-28 NOTE — Progress Notes (Signed)
Echocardiogram 2D Echocardiogram has been performed.  Cheyenne Guzman 11/28/2012, 8:03 AM

## 2012-11-28 NOTE — Discharge Summary (Signed)
Physician Discharge Summary  Patient ID: Cheyenne Guzman MRN: [id number] DOB/AGE: [birth year] 47 y.o.  Admit date: 11/27/2012 Discharge date: 11/28/2012  Primary Care Physician:  Nelwyn Salisbury, MD   Final Discharge Diagnoses:    Atypical chest pain with dyspnea - resolved, possibly panic attacks   Secondary discharge diagnosis . DIABETES MELLITUS, TYPE II . HYPERLIPIDEMIA . ANXIETY . DEPRESSION . Morbid obesity  Consults:  Cardiology, Dr. Sharyn Lull   Recommendations for Outpatient Follow-up:  1) please refer her to outpatient psychiatry followup if patient continues to have anxiety  Allergies:  No Known Allergies   Discharge Medications:   Medication List         albuterol (2.5 MG/3ML) 0.083% nebulizer solution  Commonly known as:  PROVENTIL  Take 2.5 mg by nebulization every 4 (four) hours as needed for wheezing.     albuterol 108 (90 BASE) MCG/ACT inhaler  Commonly known as:  PROVENTIL HFA;VENTOLIN HFA  Inhale 2 puffs into the lungs every 4 (four) hours as needed for wheezing or shortness of breath.     aspirin 325 MG EC tablet  Take 325 mg by mouth at bedtime.     clonazePAM 0.5 MG tablet  Commonly known as:  KLONOPIN  Take 1 tablet (0.5 mg total) by mouth 2 (two) times daily as needed for anxiety.     furosemide 40 MG tablet  Commonly known as:  LASIX  Take 1 tablet (40 mg total) by mouth 2 (two) times daily as needed. As needed for swelling.     insulin lispro 100 UNIT/ML injection  Commonly known as:  HUMALOG  Inject 75 Units into the skin 3 (three) times daily with meals.     metFORMIN 500 MG tablet  Commonly known as:  GLUCOPHAGE  Take 1,000 mg by mouth daily after supper.     multivitamin with minerals Tabs tablet  Take 1 tablet by mouth at bedtime.     nitroGLYCERIN 0.4 MG SL tablet  Commonly known as:  NITROSTAT  Place 1 tablet (0.4 mg total) under the tongue every 5 (five) minutes x 3 doses as needed for chest pain.     sertraline 50 MG  tablet  Commonly known as:  ZOLOFT  Take 50 mg by mouth at bedtime.     simvastatin 40 MG tablet  Commonly known as:  ZOCOR  Take 1 tablet (40 mg total) by mouth every evening.     traMADol 50 MG tablet  Commonly known as:  ULTRAM  Take 1 tablet (50 mg total) by mouth every 6 (six) hours as needed for moderate pain.         Brief H and P: For complete details please refer to admission H and P, but in brief 47year old morbidly obese female with history of diabetes mellitus type 2, history of TIA, hyperlipidemia, anxiety and depression who was at her PCP office today with symptoms of chest pressure for past 2 days. Also had headache with some lightheadedness. She also reported some dyspnea on exertion for past 2 days. She reported the chest pressure to be present throughout the day almost 8/10 in intensity, nonradiating, without any aggravating or relieving factors.  Denied any wheezing but does feel more short of breath on exertion. She denied any orthopnea or PND.  She denied any palpitations, fever, chills, runny nose, nausea, vomiting, abdominal pain, bowel or urinary symptoms. Denied any trauma or recent travel. She also reported some frontal headache or dizziness for the last few days  Hospital Course:   Chest pain with dyspnea: Somewhat atypical, after detailed conversation, patient did report that all the symptoms started after a stressful issue at work on Tuesday 11/25/12, differential diagnosis also includes panic attack/anxiety. D-dimer was negative. Cardiology was consulted and recommended a 2-D echocardiogram. 2-D echo showed EF of 50- 55%, no regional wall motion abnormalities, no valvular disorders.  Unfortunately cannot have the nuclear stress test due to morbid obesity.    D-dimer was negative, patient continues to complain of chest pain episode radiating to the back, hence CT angiogram of the chest was obtained to rule out any aortic dissection. CT angiogram of the chest and  abdomen showed no acute cardiopulmonary disease, no thoracic aortic dissection, aneurysm or a central PE, in no acute findings in the abdomen or pelvis. Patient had moderate colonic stool burden without obstruction.     DIABETES MELLITUS, TYPE II:  - Uncontrolled, counseled patient on changing lifestyle, diet and weight control   HYPERLIPIDEMIA   ANXIETY/ DEPRESSION : Patient was started on Klonopin 0.5 mg  BID prn for anxiety   Morbid obesity  - Counseled on diet and weight control  Day of Discharge BP 105/55  Pulse 68  Temp(Src) 97.6 F (36.4 C) (Oral)  Resp 18  Ht 5\' 7"  (1.702 m)  Wt 180.7 kg (398 lb 5.9 oz)  BMI 62.38 kg/m2  SpO2 95%  Physical Exam: General: Alert and awake oriented x3 not in any acute distress. CVS: S1-S2 clear no murmur rubs or gallops Chest: clear to auscultation bilaterally, no wheezing rales or rhonchi Abdomen: morbidly obese  soft nontender, nondistended, normal bowel sounds Extremities: no cyanosis, clubbing or edema noted bilaterally Neuro: Cranial nerves II-XII intact, no focal neurological deficits   The results of significant diagnostics from this hospitalization (including imaging, microbiology, ancillary and laboratory) are listed below for reference.    LAB RESULTS: Basic Metabolic Panel:  Recent Labs Lab 11/27/12 1057  NA 138  K 4.7  CL 102  CO2 26  GLUCOSE 213*  BUN 11  CREATININE 0.76  CALCIUM 9.3   Liver Function Tests: No results found for this basename: AST, ALT, ALKPHOS, BILITOT, PROT, ALBUMIN,  in the last 168 hours No results found for this basename: LIPASE, AMYLASE,  in the last 168 hours No results found for this basename: AMMONIA,  in the last 168 hours CBC:  Recent Labs Lab 11/27/12 1057  WBC 7.2  HGB 13.9  HCT 42.9  MCV 80.6  PLT 199   Cardiac Enzymes:  Recent Labs Lab 11/28/12 0156 11/28/12 0846  TROPONINI <0.30 <0.30   BNP: No components found with this basename: POCBNP,  CBG:  Recent  Labs Lab 11/28/12 0727 11/28/12 1143  GLUCAP 199* 206*    Significant Diagnostic Studies:  Dg Chest 2 View  11/27/2012   CLINICAL DATA:  Chest pain and shortness of breath  EXAM: CHEST  2 VIEW  COMPARISON:  October 16, 2010  FINDINGS: There is no edema or consolidation. The heart size and pulmonary vascularity are within normal limits. No adenopathy. No bone lesions.  IMPRESSION: No edema or consolidation.   Electronically Signed   By: Bretta Bang M.D.   On: 11/27/2012 11:39   Ct Angio Chest Aortic Dissect W &/or W/o  11/28/2012   CLINICAL DATA:  Chest and abdominal pain, evaluate for dissection  EXAM: CT ANGIOGRAPHY CHEST, ABDOMEN AND PELVIS  TECHNIQUE: Multidetector CT imaging through the chest, abdomen and pelvis was performed using the standard protocol during bolus  administration of intravenous contrast. Multiplanar reconstructed images including MIPs were obtained and reviewed to evaluate the vascular anatomy.  CONTRAST:  OMNIPAQUE IOHEXOL 350 MG/ML SOLN  COMPARISON:  None.  FINDINGS: CTA CHEST FINDINGS  Vascular findings:  Evaluation of the ascending thoracic aorta is minimally degraded due to pulsation artifact. Normal caliber of the thoracic aorta with the proximal descending thoracic aorta measuring 32 mm in greatest oblique short axis axial dimension (as measured at the level of the main pulmonary artery - image 56, series 5). No thoracic aortic dissection or periaortic stranding. Bovine configuration of the aortic arch is incidentally noted. The visualized portions of the major branch vessel of the arch are widely patent.  Borderline cardiomegaly. Although this examination was not tailored for the evaluation of the pulmonary arteries, there are no discrete filling defects within the central pulmonary arterial tree to suggest the presence of central pulmonary embolism. Normal caliber of the main pulmonary artery.  Review of the MIP images confirms the above findings.  Nonvascular  findings:  Evaluation of the pulmonary parenchyma is minimally degraded due to patient body habitus and patient respiratory artifact.  There is minimal grossly symmetric deep tendon subpleural ground-glass atelectasis. No focal airspace opacities. No discrete pulmonary nodules. No pleural effusion pneumothorax. The central pulmonary airways are widely patent.  Incidental note is made of mediastinal lipomatosis. Scattered shotty mediastinal and pericardiac lymph nodes are individually not enlarged by size criteria, and likely reactive due to patient body habitus. No mediastinal, hilar or axillary lymphadenopathy.  No acute osseous acute or aggressive osseous abnormalities within the chest. Normal appearance of the imaged caudal aspect of the thyroid gland.   ---------------------------------------------------------------------------------  CTA ABDOMEN AND PELVIS FINDINGS  Vascular Findings:  Abdominal aorta: Normal caliber of the abdominal aorta. No significant atherosclerotic plaque within the abdominal aorta. No abdominal aortic dissection or periaortic stranding.  Celiac artery: Widely patent without hemodynamically significant narrowing. The ventral branching pattern.  SMA: Widely patent without hemodynamically significant narrowing. A conventional branching pattern.  Right renal artery: Duplicated; there is an accessory right-sided renal artery which arises from the caudal aspect of the abdominal aorta (below of the IMA) to supply the inferior pole of the right kidney. Both right-sided renal arteries appear widely patent without hemodynamically significant narrowing.  Left renal artery: Solitary descending colon widely patent.  IMA: Patent.  Pelvic vasculature: There is a minimal amount of eccentric mixed calcified and noncalcified atherosclerotic plaque within the right common iliac artery, not resulting in hemodynamically significant narrowing. The left common and bilateral external and internal iliac arteries  are of normal caliber and widely patent without hemodynamically significant narrowing.  Review of the MIP images confirms the above findings.  Nonvascular Findings:  Evaluation of the abdominal organs is limited to the arterial phase of enhancement.  There is mild diffuse decreased attenuation of the hepatic parenchyma on this early arterial phase examination suggestive of hepatic steatosis. Mild hepatomegaly. No discrete hyperenhancing hepatic lesions. The gallbladder is underdistended but otherwise normal. No definite intra or extrahepatic biliary duct dilatation. No ascites.  There is symmetric enhancement of the bilateral kidneys. Incidental note is made of a slightly exaggerated horizontal lie of the right kidney. No discrete renal lesions. No definite renal stones on this postcontrast examination. No urinary obstruction or perinephric stranding. Normal appearance of the bilateral adrenal glands, pancreas and spleen.  Moderate colonic stool burden without evidence of obstruction. The bowel is otherwise normal in course and caliber without wall thickening. Normal appearance of  the appendix. No pneumoperitoneum, pneumatosis or portal venous gas.  Scattered shotty retroperitoneal, pelvic and inguinal lymph nodes are individually not enlarged by size criteria and are presumably reactive due to patient body habitus. No definite retroperitoneal, mesenteric, pelvic or inguinal lymphadenopathy.  Posthysterectomy. No discrete adnexal lesion. No free fluid within the pelvis.  No acute or aggressive osseous abnormalities. Mild to moderate bilateral facet degenerative change within the lower lumbar spine. Presumed postsurgical change of the midline of the lower abdomen/ upper pelvis without associated hernia.  IMPRESSION: 1. No acute cardiopulmonary disease. Specifically, no evidence of thoracic aortic dissection, aneurysm or central pulmonary embolism. 2. No acute findings within the abdomen or pelvis. Specifically, no  evidence of abdominal aortic aneurysm or dissection. The major branch vessels of the abdominal aorta appear widely patent. 3. Findings suggestive of hepatic steatosis and hepatomegaly. 4. Moderate colonic stool burden without evidence of obstruction. 5. Post hysterectomy.   Electronically Signed   By: Simonne Come M.D.   On: 11/28/2012 12:45    2D ECHO: Study Conclusions  - Left ventricle: The cavity size was normal. Systolic function was normal. The estimated ejection fraction was in the range of 50% to 55%. Wall motion was normal; there were no regional wall motion abnormalities. Left ventricular diastolic function parameters were normal. - Atrial septum: No defect or patent foramen ovale was identified.    Disposition and Follow-up:     Discharge Orders   Future Orders Complete By Expires   Diet Carb Modified  As directed    Increase activity slowly  As directed        DISPOSITION:home  DIET carb modified    DISCHARGE FOLLOW-UP Follow-up Information   Follow up with FRY,STEPHEN A, MD. Schedule an appointment as soon as possible for a visit in 2 weeks.   Specialty:  Family Medicine   Contact information:   [address] [place] [address] [phone]       Time spent on Discharge: 35 MINS  Signed:   RAI,RIPUDEEP M.D. [facility] 11/28/2012, 1:05 PM Pager: [phone]

## 2012-12-30 ENCOUNTER — Other Ambulatory Visit (INDEPENDENT_AMBULATORY_CARE_PROVIDER_SITE_OTHER): Payer: Self-pay | Admitting: General Surgery

## 2012-12-30 DIAGNOSIS — Z853 Personal history of malignant neoplasm of breast: Secondary | ICD-10-CM

## 2012-12-30 DIAGNOSIS — N644 Mastodynia: Secondary | ICD-10-CM

## 2013-01-02 ENCOUNTER — Other Ambulatory Visit: Payer: Self-pay | Admitting: Family Medicine

## 2013-01-02 DIAGNOSIS — N644 Mastodynia: Secondary | ICD-10-CM

## 2013-01-02 DIAGNOSIS — Z853 Personal history of malignant neoplasm of breast: Secondary | ICD-10-CM

## 2013-01-08 ENCOUNTER — Ambulatory Visit
Admission: RE | Admit: 2013-01-08 | Discharge: 2013-01-08 | Disposition: A | Discharge status: Home / Self Care | Payer: BC Managed Care – PPO | Source: Ambulatory Visit | Attending: General Surgery | Admitting: General Surgery

## 2013-01-08 DIAGNOSIS — N644 Mastodynia: Secondary | ICD-10-CM

## 2013-01-08 DIAGNOSIS — Z853 Personal history of malignant neoplasm of breast: Secondary | ICD-10-CM

## 2013-01-26 ENCOUNTER — Other Ambulatory Visit: Payer: Self-pay | Admitting: Obstetrics and Gynecology

## 2013-01-26 DIAGNOSIS — Z853 Personal history of malignant neoplasm of breast: Secondary | ICD-10-CM

## 2013-02-13 ENCOUNTER — Other Ambulatory Visit: Payer: BC Managed Care – PPO

## 2013-02-17 ENCOUNTER — Encounter: Payer: Self-pay | Admitting: Family Medicine

## 2013-02-17 ENCOUNTER — Ambulatory Visit (INDEPENDENT_AMBULATORY_CARE_PROVIDER_SITE_OTHER): Payer: BC Managed Care – PPO | Admitting: Family Medicine

## 2013-02-17 VITALS — BP 130/84 | HR 97 | Temp 99.1°F

## 2013-02-17 DIAGNOSIS — J069 Acute upper respiratory infection, unspecified: Secondary | ICD-10-CM

## 2013-02-17 DIAGNOSIS — J45901 Unspecified asthma with (acute) exacerbation: Secondary | ICD-10-CM

## 2013-02-17 MED ORDER — AZITHROMYCIN 250 MG PO TABS: ORAL_TABLET | ORAL | Status: DC

## 2013-02-17 MED ORDER — ALBUTEROL SULFATE (2.5 MG/3ML) 0.083% IN NEBU: 2.5000 mg | INHALATION_SOLUTION | RESPIRATORY_TRACT | Status: DC | PRN

## 2013-02-17 MED ORDER — HYDROCODONE-HOMATROPINE 5-1.5 MG/5ML PO SYRP: 5.0000 mL | ORAL_SOLUTION | Freq: Three times a day (TID) | ORAL | Status: DC | PRN

## 2013-02-17 MED ORDER — PREDNISONE 20 MG PO TABS: 40.0000 mg | ORAL_TABLET | Freq: Every day | ORAL | Status: DC

## 2013-02-17 NOTE — Patient Instructions (Signed)

## 2013-02-17 NOTE — Progress Notes (Signed)
Chief Complaint  Patient presents with  . Bronchitis    HPI:  -started: yesterday -symptoms:nasal congestion, sore throat, productive cough, wheezing, hurst from coughing, low grade temp, mild SOB -denies:fever,NVD, tooth pain -has tried: albuterol -sick contacts/travel/risks: denies flu exposure or Ebola risks -Hx of: allergies -reports always does prednisone and zpak for this  ROS: See pertinent positives and negatives per HPI.  Past Medical History  Diagnosis Date  . Depression   . Hyperlipidemia   . TIA (transient ischemic attack)     in 2007,2008, and 2011  . Urosepsis 08-2009    with Klebsiella  . Breast mass in female   . Stroke   . Asthma     seasonal   . Diabetes mellitus     sees Dr. Chalmers Cater, diagnosed 2003  . Kidney infection     - hosp. [facility]- septic- 10/2009  . Cancer     breast, sees Dr. Jana Hakim , Left - 11/2007  . Arthritis     back & ankles   . Anxiety     per pt. - mild   . Peripheral neuropathy     R sided numbness- face & jaw    Past Surgical History  Procedure Laterality Date  . Foot surgery      plantar fasciitis  . Nerve graft  06-01-08    cadaver graft to right inferior alveolar nerve  in [place]   . Abdominal hysterectomy  08/2004  . Breast surgery  11/2007    left ductal carcinoma in situ  . Breast biopsy  06/22/2011    Procedure: BREAST BIOPSY;  Surgeon: Stark Klein, MD;  Location: [facility];  Service: General;  Laterality: Right;    Family History  Problem Relation Age of Onset  . Cancer Mother     breast  . Cancer Father     lung  . Cancer Sister     colon  . Cancer Maternal Aunt     breast - both  . Anesthesia problems Neg Hx     History   Social History  . Marital Status: Married    Spouse Name: N/A    Number of Children: N/A  . Years of Education: N/A   Social History Main Topics  . Smoking status: Former Smoker    Quit date: 02/18/1997  . Smokeless tobacco: Never Used  . Alcohol Use: Yes     Comment: rare  . Drug  Use: No  . Sexual Activity: None   Other Topics Concern  . None   Social History Narrative  . None    Current outpatient prescriptions:albuterol (PROVENTIL HFA;VENTOLIN HFA) 108 (90 BASE) MCG/ACT inhaler, Inhale 2 puffs into the lungs every 4 (four) hours as needed for wheezing or shortness of breath., Disp: , Rfl: ;  albuterol (PROVENTIL) (2.5 MG/3ML) 0.083% nebulizer solution, Take 3 mLs (2.5 mg total) by nebulization every 4 (four) hours as needed for wheezing., Disp: 75 mL, Rfl: 0 aspirin 325 MG EC tablet, Take 325 mg by mouth at bedtime. , Disp: , Rfl: ;  clonazePAM (KLONOPIN) 0.5 MG tablet, Take 1 tablet (0.5 mg total) by mouth 2 (two) times daily as needed for anxiety., Disp: 20 tablet, Rfl: 0;  furosemide (LASIX) 40 MG tablet, Take 1 tablet (40 mg total) by mouth 2 (two) times daily as needed. As needed for swelling., Disp: 60 tablet, Rfl: 11 insulin lispro (HUMALOG) 100 UNIT/ML injection, Inject 75 Units into the skin 3 (three) times daily with meals. , Disp: , Rfl: ;  metFORMIN (GLUCOPHAGE) 500 MG tablet, Take 1,000 mg by mouth daily after supper. , Disp: , Rfl: ;  Multiple Vitamin (MULTIVITAMIN WITH MINERALS) TABS, Take 1 tablet by mouth at bedtime., Disp: , Rfl:  nitroGLYCERIN (NITROSTAT) 0.4 MG SL tablet, Place 1 tablet (0.4 mg total) under the tongue every 5 (five) minutes x 3 doses as needed for chest pain., Disp: 60 tablet, Rfl: 12;  sertraline (ZOLOFT) 50 MG tablet, Take 50 mg by mouth at bedtime., Disp: , Rfl: ;  simvastatin (ZOCOR) 40 MG tablet, Take 1 tablet (40 mg total) by mouth every evening., Disp: 90 tablet, Rfl: 2 traMADol (ULTRAM) 50 MG tablet, Take 1 tablet (50 mg total) by mouth every 6 (six) hours as needed for moderate pain., Disp: 30 tablet, Rfl: 1;  azithromycin (ZITHROMAX) 250 MG tablet, 2 tabs daily on first day then one tab daily for 4 more days, Disp: 6 tablet, Rfl: 0;  HYDROcodone-homatropine (HYCODAN) 5-1.5 MG/5ML syrup, Take 5 mLs by mouth every 8 (eight) hours  as needed for cough., Disp: 120 mL, Rfl: 0 predniSONE (DELTASONE) 20 MG tablet, Take 2 tablets (40 mg total) by mouth daily with breakfast., Disp: 8 tablet, Rfl: 0  EXAM:  Filed Vitals:   02/17/13 1128  BP: 130/84  Pulse: 97  Temp: 99.1 F (37.3 C)    Body mass index is 0.00 kg/(m^2).  GENERAL: vitals reviewed and listed above, alert, oriented, appears well hydrated and in no acute distress  HEENT: atraumatic, conjunttiva clear, no obvious abnormalities on inspection of external nose and ears, normal appearance of ear canals and TMs, clear nasal congestion, mild post oropharyngeal erythema with PND, no tonsillar edema or exudate, no sinus TTP  NECK: no obvious masses on inspection  LUNGS: clear to auscultation bilaterally, no wheezes, rales or rhonchi, good air movement  CV: HRRR, no peripheral edema  MS: moves all extremities without noticeable abnormality  PSYCH: pleasant and cooperative, no obvious depression or anxiety  ASSESSMENT AND PLAN:  Discussed the following assessment and plan:  Upper respiratory infection - Plan: albuterol (PROVENTIL) (2.5 MG/3ML) 0.083% nebulizer solution, predniSONE (DELTASONE) 20 MG tablet, azithromycin (ZITHROMAX) 250 MG tablet, HYDROcodone-homatropine (HYCODAN) 5-1.5 MG/5ML syrup  Asthma with acute exacerbation - Plan: albuterol (PROVENTIL) (2.5 MG/3ML) 0.083% nebulizer solution, predniSONE (DELTASONE) 20 MG tablet, azithromycin (ZITHROMAX) 250 MG tablet, HYDROcodone-homatropine (HYCODAN) 5-1.5 MG/5ML syrup  -given HPI and exam findings today, a serious infection or illness is unlikely.  -We discussed potential etiologies, with VURI and or asthma exacerbation with fever and increased sputum production being most likely - no resp distress, normal lung exam and O2 ok -flu unlikely but offered testing - after discussion tx risks she opted not to do this -We discussed treatment side effects, likely course, antibiotic misuse, transmission, and  signs of developing a serious illness. -of course, we advised to return or notify a doctor immediately if symptoms worsen or persist or new concerns arise.    Patient Instructions  -As we discussed, we have prescribed a new medication for you at this appointment. We discussed the common and serious potential adverse effects of this medication and you can review these and more with the pharmacist when you pick up your medication.  Please follow the instructions for use carefully and notify us immediately if you have any problems taking this medication.  -follow up as needed     Sammi Stolarz, Jarrett Soho R.

## 2013-02-17 NOTE — Progress Notes (Signed)
Pre visit review using our clinic review tool, if applicable. No additional management support is needed unless otherwise documented below in the visit note. 

## 2013-02-23 ENCOUNTER — Other Ambulatory Visit: Payer: BC Managed Care – PPO

## 2013-02-23 ENCOUNTER — Telehealth: Payer: Self-pay | Admitting: Family Medicine

## 2013-02-23 NOTE — Telephone Encounter (Signed)
Call in Levaquin 500 mg daily for 10 days  

## 2013-02-23 NOTE — Telephone Encounter (Signed)
Please advise 

## 2013-02-23 NOTE — Telephone Encounter (Signed)
Patient Information:  Caller Name: Trystan  Phone: [phone]  Patient: Cheyenne Guzman, Cheyenne Guzman  Gender: Female  DOB: May 04, 1965  Age: 48 Years  PCP: [name] [facility])  Pregnant: No  Office Follow Up:  Does the office need to follow up with this patient?: Yes  Instructions For The Office: No appts availalbe with Dr. Sarajane Jews.   Symptoms  Reason For Call & Symptoms: Patient calling, she was seen on 1/27 with a sinus infection.  Completed her antibiotic and steroids on 1/31 and still having same sx.  No fever.  Constantly blowing her nose. Her FBS was 124mg  this am.  Reviewed Health History In EMR: Yes  Reviewed Medications In EMR: Yes  Reviewed Allergies In EMR: Yes  Reviewed Surgeries / Procedures: Yes  Date of Onset of Symptoms: 02/17/2013  Treatments Tried: completed antibiotics and steroids  Treatments Tried Worked: No OB / GYN:  LMP: Unknown  Guideline(s) Used:  Sinus Pain and Congestion  Disposition Per Guideline:   Go to Office Now  Reason For Disposition Reached:   Severe sinus pain  Advice Given:  N/A  Patient Will Follow Care Advice:  YES

## 2013-02-24 MED ORDER — LEVOFLOXACIN 500 MG PO TABS: 500.0000 mg | ORAL_TABLET | Freq: Every day | ORAL | Status: DC

## 2013-02-24 NOTE — Telephone Encounter (Signed)
Rx sent to pharmacy.  Called and spoke with pt and pt is aware.  

## 2013-03-04 ENCOUNTER — Ambulatory Visit
Admission: RE | Admit: 2013-03-04 | Discharge: 2013-03-04 | Disposition: A | Discharge status: Home / Self Care | Payer: BC Managed Care – PPO | Source: Ambulatory Visit | Attending: Obstetrics and Gynecology | Admitting: Obstetrics and Gynecology

## 2013-03-04 DIAGNOSIS — Z853 Personal history of malignant neoplasm of breast: Secondary | ICD-10-CM

## 2013-03-04 MED ORDER — GADOBENATE DIMEGLUMINE 529 MG/ML IV SOLN
20.0000 mL | Freq: Once | INTRAVENOUS | Status: AC | PRN
  Administered 2013-03-04: 20 mL via INTRAVENOUS

## 2013-03-22 ENCOUNTER — Other Ambulatory Visit: Payer: Self-pay | Admitting: Family Medicine

## 2013-04-20 ENCOUNTER — Other Ambulatory Visit: Payer: Self-pay | Admitting: Family Medicine

## 2013-06-10 ENCOUNTER — Ambulatory Visit (INDEPENDENT_AMBULATORY_CARE_PROVIDER_SITE_OTHER): Payer: BC Managed Care – PPO | Admitting: Family Medicine

## 2013-06-10 ENCOUNTER — Encounter: Payer: Self-pay | Admitting: Family Medicine

## 2013-06-10 VITALS — BP 166/91 | HR 81 | Temp 98.7°F

## 2013-06-10 DIAGNOSIS — F411 Generalized anxiety disorder: Secondary | ICD-10-CM

## 2013-06-10 DIAGNOSIS — M545 Low back pain, unspecified: Secondary | ICD-10-CM

## 2013-06-10 MED ORDER — ALPRAZOLAM 1 MG PO TABS: 1.0000 mg | ORAL_TABLET | Freq: Three times a day (TID) | ORAL | Status: DC | PRN

## 2013-06-10 MED ORDER — SERTRALINE HCL 100 MG PO TABS: 100.0000 mg | ORAL_TABLET | Freq: Every day | ORAL | Status: DC

## 2013-06-10 NOTE — Progress Notes (Signed)
   Subjective:    Patient ID: Cheyenne Guzman, female    DOB: May 14, 1965, 48 y.o.   MRN: [id number]  HPI Here for worsening anxiety. She has a lot going on in her life, from working full time, to caring for her husband who is recovering from spinal surgery, to caring for her mother with severe dementia. She takes 50 mg of Zoloft daily. She ran out of Clonazepam so she has been using some of her husband's Xanax, and this helps a little. Also her low back pain has been getting worse and she is unable to work. Using heat and Tramadol.    Review of Systems  Constitutional: Negative.   Musculoskeletal: Positive for back pain.  Psychiatric/Behavioral: Positive for sleep disturbance, dysphoric mood and decreased concentration. Negative for hallucinations, confusion and agitation. The patient is nervous/anxious.        Objective:   Physical Exam  Constitutional: She appears well-developed and well-nourished.  Musculoskeletal: Normal range of motion. She exhibits no edema and no tenderness.  Psychiatric: Her behavior is normal. Thought content normal.  Anxious           Assessment & Plan:  We will write her out of work from today until 06-24-13. Increase Zoloft to 100 mg daily. Add Xanax prn. Recheck in 2 weeks

## 2013-06-10 NOTE — Progress Notes (Signed)
Pre visit review using our clinic review tool, if applicable. No additional management support is needed unless otherwise documented below in the visit note. 

## 2013-06-11 ENCOUNTER — Telehealth: Payer: Self-pay | Admitting: Family Medicine

## 2013-06-11 DIAGNOSIS — R42 Dizziness and giddiness: Secondary | ICD-10-CM

## 2013-06-11 DIAGNOSIS — H9319 Tinnitus, unspecified ear: Secondary | ICD-10-CM

## 2013-06-11 NOTE — Telephone Encounter (Signed)
Pt was seen yesterday and discuss ringing in her ears and vertigo with md. Pt needs referral to [facility]. Pt has [employer]. Pt can not make her own appt. Per pt ent needs office notes as well as ins referral

## 2013-06-11 NOTE — Telephone Encounter (Signed)
Referral was done  

## 2013-06-11 NOTE — Telephone Encounter (Signed)
I spoke with pt  

## 2013-06-23 ENCOUNTER — Encounter: Payer: Self-pay | Admitting: Family Medicine

## 2013-06-23 ENCOUNTER — Ambulatory Visit (INDEPENDENT_AMBULATORY_CARE_PROVIDER_SITE_OTHER): Payer: BC Managed Care – PPO | Admitting: Family Medicine

## 2013-06-23 VITALS — BP 177/91 | HR 84 | Temp 99.3°F | Ht 67.0 in

## 2013-06-23 DIAGNOSIS — H9319 Tinnitus, unspecified ear: Secondary | ICD-10-CM

## 2013-06-23 DIAGNOSIS — R519 Headache, unspecified: Secondary | ICD-10-CM | POA: Insufficient documentation

## 2013-06-23 DIAGNOSIS — F411 Generalized anxiety disorder: Secondary | ICD-10-CM

## 2013-06-23 DIAGNOSIS — H9190 Unspecified hearing loss, unspecified ear: Secondary | ICD-10-CM

## 2013-06-23 DIAGNOSIS — R51 Headache: Secondary | ICD-10-CM

## 2013-06-23 MED ORDER — SERTRALINE HCL 100 MG PO TABS: 200.0000 mg | ORAL_TABLET | Freq: Every day | ORAL | Status: DC

## 2013-06-23 NOTE — Progress Notes (Signed)
   Subjective:    Patient ID: Cheyenne Guzman, female    DOB: September 02, 1965, 48 y.o.   MRN: [id number]  HPI Here to follow up on anxiety. Two weeks ago we increased her Zoloft to 100 mg daily and she uses prn Xanax. This has helped a little but she is still a bundle of nerves. She saw Dr. Constance Holster who told her she does not have Menieres disease but instead is having vestibular migraines. He has referred to a "migraine specialist" neurologist, but she does not know who this will be. She saw an audiologist there who found she has bilateral hearing deficits in addition to the tinnitus, and she recommended Cheyenne Guzman get hearing aids.    Review of Systems  Constitutional: Negative.   HENT: Positive for hearing loss.   Neurological: Positive for dizziness and headaches.  Psychiatric/Behavioral: Negative for dysphoric mood. The patient is nervous/anxious.        Objective:   Physical Exam  Constitutional: She is oriented to person, place, and time. She appears well-developed and well-nourished.  Cardiovascular: Normal rate, regular rhythm, normal heart sounds and intact distal pulses.   Pulmonary/Chest: Effort normal and breath sounds normal.  Neurological: She is alert and oriented to person, place, and time.  Psychiatric: She has a normal mood and affect. Her behavior is normal. Thought content normal.          Assessment & Plan:  We will increase the Zoloft to 200 mg daily, use Xanax prn. She will see the Neurologist as above. Dr. Constance Holster is also setting her up for a brain MRI soon.

## 2013-06-23 NOTE — Progress Notes (Signed)
Pre visit review using our clinic review tool, if applicable. No additional management support is needed unless otherwise documented below in the visit note. 

## 2013-06-25 ENCOUNTER — Encounter: Payer: Self-pay | Admitting: Neurology

## 2013-06-25 ENCOUNTER — Ambulatory Visit (INDEPENDENT_AMBULATORY_CARE_PROVIDER_SITE_OTHER): Payer: BC Managed Care – PPO | Admitting: Neurology

## 2013-06-25 VITALS — BP 136/75 | HR 80 | Ht 67.0 in | Wt 375.0 lb

## 2013-06-25 DIAGNOSIS — R519 Headache, unspecified: Secondary | ICD-10-CM

## 2013-06-25 DIAGNOSIS — E119 Type 2 diabetes mellitus without complications: Secondary | ICD-10-CM

## 2013-06-25 DIAGNOSIS — G4440 Drug-induced headache, not elsewhere classified, not intractable: Secondary | ICD-10-CM | POA: Insufficient documentation

## 2013-06-25 DIAGNOSIS — R51 Headache: Secondary | ICD-10-CM

## 2013-06-25 DIAGNOSIS — H9190 Unspecified hearing loss, unspecified ear: Secondary | ICD-10-CM

## 2013-06-25 MED ORDER — BUTALBITAL-APAP-CAFFEINE 50-325-40 MG PO TABS: 1.0000 | ORAL_TABLET | Freq: Four times a day (QID) | ORAL | Status: AC | PRN

## 2013-06-25 MED ORDER — TOPIRAMATE 100 MG PO TABS: ORAL_TABLET | ORAL | Status: DC

## 2013-06-25 NOTE — Progress Notes (Signed)
PATIENT: Cheyenne Guzman DOB: 1965/05/30  HISTORICAL  Cheyenne Guzman is a 48 right-handed Caucasian female, referred by ENT Dr. Constance Holster, and her primary care physician Dr. Sarajane Jews for evaluation of headaches with associated dizziness, nausea, neck pain, pressure behind eyes, anxiety, blurred vision.  She had a past medical history of obesity, occasionally migraine the past, hyperlipidemia, depression, reported excessive stress recently, a lot of family member is sick.   She reported increased frequency of migraines since April,  3 weeks history constant daily headaches, left retro-orbital area, left parietal, occipital region, pounding headaches, with associated light sensitivity, nauseous, movements make it worse, dizziness, lasting couple hours, multiple episode in a day,  She also complains of dizziness, spinning sensation, decreased hearing, She was evaluated by Dr. Constance Holster in Jun 19 2013, they will was bilateral symmetric high frequency sensory neural no hearing loss, otherwise normal exam,  She is to take off work as a constant past couple weeks because of her constant headaches, she also complains of blurry vision,  REVIEW OF SYSTEMS: Full 14 system review of systems performed and notable only for fatigue, chest pain, hearing loss, ringing ears, blurry vision, eye pressure constipation, headaches, dizziness, snoring, anxiety, too much sleep, decreased energy  ALLERGIES: No Known Allergies  HOME MEDICATIONS: Current Outpatient Prescriptions on File Prior to Visit  Medication Sig Dispense Refill  . albuterol (PROVENTIL HFA;VENTOLIN HFA) 108 (90 BASE) MCG/ACT inhaler Inhale 2 puffs into the lungs every 4 (four) hours as needed for wheezing or shortness of breath.      Marland Kitchen albuterol (PROVENTIL) (2.5 MG/3ML) 0.083% nebulizer solution Take 3 mLs (2.5 mg total) by nebulization every 4 (four) hours as needed for wheezing.  75 mL  0  . ALPRAZolam (XANAX) 1 MG tablet Take 1 tablet (1 mg total) by  mouth 3 (three) times daily as needed for anxiety.  90 tablet  2  . aspirin 325 MG EC tablet Take 325 mg by mouth at bedtime.       . furosemide (LASIX) 40 MG tablet Take 1 tablet (40 mg total) by mouth 2 (two) times daily as needed. As needed for swelling.  60 tablet  11  . insulin lispro (HUMALOG) 100 UNIT/ML injection Inject 75 Units into the skin 3 (three) times daily with meals.       . insulin regular (NOVOLIN R,HUMULIN R) 100 units/mL injection Inject 75 Units into the skin 3 (three) times daily before meals. Humulin      . metFORMIN (GLUCOPHAGE) 500 MG tablet Take 1,000 mg by mouth daily after supper.       . Multiple Vitamin (MULTIVITAMIN WITH MINERALS) TABS Take 1 tablet by mouth at bedtime.      . sertraline (ZOLOFT) 100 MG tablet Take 2 tablets (200 mg total) by mouth daily.  180 tablet  3  . simvastatin (ZOCOR) 40 MG tablet TAKE 1 TABLET IN THE       EVENING  90 tablet  0  . traMADol (ULTRAM) 50 MG tablet Take 1 tablet (50 mg total) by mouth every 6 (six) hours as needed for moderate pain.  30 tablet  1     PAST MEDICAL HISTORY: Past Medical History  Diagnosis Date  . Depression   . Hyperlipidemia   . TIA (transient ischemic attack)     in 2007,2008, and 2011  . Urosepsis 08-2009    with Klebsiella  . Breast mass in female   . Stroke   . Asthma  seasonal   . Diabetes mellitus     sees Dr. Chalmers Cater, diagnosed 2003  . Kidney infection     - hosp. [facility]- septic- 10/2009  . Cancer     breast, sees Dr. Jana Hakim , Left - 11/2007  . Arthritis     back & ankles   . Anxiety     per pt. - mild   . Peripheral neuropathy     R sided numbness- face & jaw  . HA (headache)     PAST SURGICAL HISTORY: Past Surgical History  Procedure Laterality Date  . Foot surgery      plantar fasciitis  . Nerve graft  06-01-08    cadaver graft to right inferior alveolar nerve  in [place]   . Abdominal hysterectomy  08/2004  . Breast surgery  11/2007    left ductal carcinoma in situ  . Breast  biopsy  06/22/2011    Procedure: BREAST BIOPSY;  Surgeon: Stark Klein, MD;  Location: [facility];  Service: General;  Laterality: Right;    FAMILY HISTORY: Family History  Problem Relation Age of Onset  . Cancer Mother     breast  . Cancer Father     lung  . Cancer Sister     colon  . Cancer Maternal Aunt     breast - both  . Anesthesia problems Neg Hx     SOCIAL HISTORY:  History   Social History  . Marital Status: Married    Spouse Name: [name]    Number of Children: 2  . Years of Education: 8   Occupational History    [employer]   Social History Main Topics  . Smoking status: Former Smoker    Quit date: 02/18/1997  . Smokeless tobacco: Never Used  . Alcohol Use: Yes     Comment: rare  . Drug Use: No  . Sexual Activity: Not on file   Other Topics Concern  . Not on file   Social History Narrative   Patient lives at home with her husband [name]) and two daughters.    Patient works for [employer] full time patient is on medical leave at this time.   Education two years of college.   Right handed.   Caffeine two times per day.     PHYSICAL EXAM   Filed Vitals:   06/25/13 0910  BP: 136/75  Pulse: 80  Height: 5\' 7"  (1.702 m)  Weight: 375 lb (170.099 kg)    Not recorded    Body mass index is 58.72 kg/(m^2).   Generalized: In no acute distress  Neck: Supple, no carotid bruits   Cardiac: Regular rate rhythm  Pulmonary: Clear to auscultation bilaterally  Musculoskeletal: No deformity  Neurological examination  Mentation: Alert oriented to time, place, history taking, and causual conversation, obese  Cranial nerve II-XII: Pupils were equal round reactive to light. Extraocular movements were full.  Visual field were full on confrontational test. Bilateral fundi edge was mildly blurred.  Facial sensation and strength were normal. Hearing was intact to finger rubbing bilaterally. Uvula tongue midline.  Head turning and shoulder shrug and were  normal and symmetric.Tongue protrusion into cheek strength was normal.  Motor: Normal tone, bulk and strength.  Sensory: Intact to fine touch, pinprick, preserved vibratory sensation, and proprioception at toes.  Coordination: Normal finger to nose, heel-to-shin bilaterally there was no truncal ataxia  Gait: Rising up from seated position without assistance, normal stance, without trunk ataxia, moderate stride, good arm swing,  smooth turning, able to perform tiptoe, and heel walking without difficulty.   Romberg signs: Negative  Deep tendon reflexes: Brachioradialis 2/2, biceps 2/2, triceps 2/2, patellar 2/2, Achilles 2/2, plantar responses were flexor bilaterally.   DIAGNOSTIC DATA (LABS, IMAGING, TESTING) - I reviewed patient records, labs, notes, testing and imaging myself where available.  Lab Results  Component Value Date   WBC 7.2 11/27/2012   HGB 13.9 11/27/2012   HCT 42.9 11/27/2012   MCV 80.6 11/27/2012   PLT 199 11/27/2012      Component Value Date/Time   NA 138 11/27/2012 1057   K 4.7 11/27/2012 1057   CL 102 11/27/2012 1057   CO2 26 11/27/2012 1057   GLUCOSE 213* 11/27/2012 1057   BUN 11 11/27/2012 1057   CREATININE 0.76 11/27/2012 1057   CREATININE 0.93 02/01/2012 1317   CALCIUM 9.3 11/27/2012 1057   PROT 7.2 02/01/2012 1317   ALBUMIN 4.4 02/01/2012 1317   AST 19 02/01/2012 1317   ALT 12 02/01/2012 1317   ALKPHOS 80 02/01/2012 1317   BILITOT 0.5 02/01/2012 1317   GFRNONAA >90 11/27/2012 1057   GFRAA >90 11/27/2012 1057   Lab Results  Component Value Date   CHOL 148 11/28/2012   HDL 31* 11/28/2012   LDLCALC 78 11/28/2012   LDLDIRECT 189.7 11/27/2006   TRIG 193* 11/28/2012   CHOLHDL 4.8 11/28/2012   Lab Results  Component Value Date   HGBA1C 7.7* 11/28/2012   No results found for this basename: VITAMINB12   Lab Results  Component Value Date   TSH 8.943* 10/02/2009      ASSESSMENT AND PLAN  Cheyenne Guzman is a 48 y.o. female complains of  new onset frequent headaches,  with migraine features, also complicated by blurry vision, she has past medical history of diabetes, obesity, questionable papillary edema on examination,  1, likely migraine headaches, 2 refer to ophthalmologist, 3, MRI of brain 4. . Evaluation, including TSH, 5, Topamax 100 mg twice a day as a preventive medications, Fioricet as needed    Marcial Pacas, M.D. Ph.D.  [facility] [address] [place], [place] [address] [phone]

## 2013-06-26 ENCOUNTER — Telehealth: Payer: Self-pay | Admitting: Neurology

## 2013-06-26 LAB — COMPREHENSIVE METABOLIC PANEL
A/G RATIO: 1.6 (ref 1.1–2.5)
ALT: 15 IU/L (ref 0–32)
AST: 21 IU/L (ref 0–40)
Albumin: 4.5 g/dL (ref 3.5–5.5)
Alkaline Phosphatase: 85 IU/L (ref 39–117)
BUN/Creatinine Ratio: 14 (ref 9–23)
BUN: 14 mg/dL (ref 6–24)
CALCIUM: 9.5 mg/dL (ref 8.7–10.2)
CO2: 18 mmol/L (ref 18–29)
CREATININE: 0.97 mg/dL (ref 0.57–1.00)
Chloride: 101 mmol/L (ref 97–108)
GFR calc Af Amer: 80 mL/min/{1.73_m2} (ref >59)
GFR, EST NON AFRICAN AMERICAN: 69 mL/min/{1.73_m2} (ref >59)
GLOBULIN, TOTAL: 2.8 g/dL (ref 1.5–4.5)
GLUCOSE: 173 mg/dL — AB (ref 65–99)
Potassium: 4.6 mmol/L (ref 3.5–5.2)
SODIUM: 142 mmol/L (ref 134–144)
TOTAL PROTEIN: 7.3 g/dL (ref 6.0–8.5)
Total Bilirubin: 0.4 mg/dL (ref 0.0–1.2)

## 2013-06-26 LAB — FOLATE: FOLATE: 12.1 ng/mL (ref >3.0)

## 2013-06-26 LAB — THYROID PANEL WITH TSH
Free Thyroxine Index: 1.4 (ref 1.2–4.9)
T3 Uptake Ratio: 21 % — ABNORMAL LOW (ref 24–39)
T4, Total: 6.5 ug/dL (ref 4.5–12.0)
TSH: 4.28 u[IU]/mL (ref 0.450–4.500)

## 2013-06-26 LAB — SEDIMENTATION RATE: Sed Rate: 35 mm/hr — ABNORMAL HIGH (ref 0–32)

## 2013-06-26 LAB — VITAMIN B12: Vitamin B-12: 673 pg/mL (ref 211–946)

## 2013-06-26 LAB — ANA: Anti Nuclear Antibody(ANA): NEGATIVE

## 2013-06-26 LAB — C-REACTIVE PROTEIN: CRP: 26.6 mg/L — ABNORMAL HIGH (ref 0.0–4.9)

## 2013-06-26 NOTE — Telephone Encounter (Signed)
I have called her, lab showed elevated esr, CRP, she still complains of headache, taking Topamax 100 mg twice a day,  She will be seen by ophthalmologist in few days, MRI of the brain is pending, keep followup appointment, we will repeat laboratory evaluation and

## 2013-07-02 ENCOUNTER — Telehealth: Payer: Self-pay

## 2013-07-02 NOTE — Telephone Encounter (Signed)
Called pt back to advise that paperwork could be picked up on tomorrow due to an additional signature needed.  Left a message for pt on personalized vm.

## 2013-07-06 ENCOUNTER — Inpatient Hospital Stay: Admission: RE | Admit: 2013-07-06 | Payer: BC Managed Care – PPO | Source: Ambulatory Visit

## 2013-07-19 ENCOUNTER — Other Ambulatory Visit: Payer: Self-pay | Admitting: Family Medicine

## 2013-07-20 ENCOUNTER — Ambulatory Visit
Admission: RE | Admit: 2013-07-20 | Discharge: 2013-07-20 | Disposition: A | Discharge status: Home / Self Care | Payer: BC Managed Care – PPO | Source: Ambulatory Visit | Attending: Neurology | Admitting: Neurology

## 2013-07-20 DIAGNOSIS — E119 Type 2 diabetes mellitus without complications: Secondary | ICD-10-CM

## 2013-07-20 DIAGNOSIS — R519 Headache, unspecified: Secondary | ICD-10-CM

## 2013-07-20 DIAGNOSIS — R51 Headache: Secondary | ICD-10-CM

## 2013-07-20 DIAGNOSIS — H9190 Unspecified hearing loss, unspecified ear: Secondary | ICD-10-CM

## 2013-07-22 NOTE — Progress Notes (Signed)
Quick Note:  Shared normal MR Brain results with patient and she verbalized understanding ______

## 2013-07-31 ENCOUNTER — Ambulatory Visit (INDEPENDENT_AMBULATORY_CARE_PROVIDER_SITE_OTHER): Payer: BC Managed Care – PPO | Admitting: Neurology

## 2013-07-31 ENCOUNTER — Encounter: Payer: Self-pay | Admitting: Neurology

## 2013-07-31 VITALS — BP 140/75 | HR 62 | Ht 67.0 in | Wt 375.0 lb

## 2013-07-31 DIAGNOSIS — G4733 Obstructive sleep apnea (adult) (pediatric): Secondary | ICD-10-CM

## 2013-07-31 MED ORDER — TOPIRAMATE ER 100 MG PO CAP24: 200.0000 mg | ORAL_CAPSULE | Freq: Every day | ORAL | Status: DC

## 2013-07-31 MED ORDER — RIZATRIPTAN BENZOATE 5 MG PO TBDP: 5.0000 mg | ORAL_TABLET | ORAL | Status: DC | PRN

## 2013-07-31 NOTE — Progress Notes (Signed)
PATIENT: Cheyenne Guzman DOB: [birth year]  HISTORICAL  Cheyenne Guzman is a 47 right-handed Caucasian female, referred by ENT Dr. Constance Guzman, and her primary care physician Dr. Sarajane Guzman for evaluation of headaches with associated dizziness, nausea, neck pain, pressure behind eyes, anxiety, blurred vision.  She had a past medical history of obesity, occasionally migraine the past, hyperlipidemia, depression, reported excessive stress recently, a lot of family member is sick.  She reported increased frequency of migraines since April,  3 weeks history constant daily headaches, left retro-orbital area, left parietal, occipital region, pounding headaches, with associated light sensitivity, nauseous, movements make it worse, dizziness, lasting couple hours, multiple episode in a day,  She also complains of dizziness, spinning sensation, decreased hearing, She was evaluated by Dr. Constance Guzman in Jun 19 2013, they will was bilateral symmetric high frequency sensory neural no hearing loss, otherwise normal exam,  She is to take off work as a constant past couple weeks because of her constant headaches, she also complains of blurry vision,  UPDATE July 10th 2015:  Per patient, she was evaluated by her ophthalmologist Cheyenne Guzman, with evidence of mild diabetic retinopathy, but otherwise normal, MRI of the brain was normal.  Lab showed elevated CRP 26, ESR 35,  Normal TSH, B12.  She reported moderate improvement 50% with Topamax, but complains of side effect from medications, includes urinary urgency, fatigue, blurry vision, slurred speech. She also complains of worsening bilateral fingers, and feet paresthesia, could hardly feel the keyboard, recent 3 weeks, she also developed right anterior leg shooting pain, to anterior shin, after prolonged sitting, relieved by standing up, she also complains of worsening depression,  crying during today's clinical visit  She still has daily left retro-orbital area headaches,  pounding, pressure, lasting 30 minutes to one hour, multiple episodes a day, 3-4 times each day, she is very hesitate to take any pain relief medications, worry about the long-term side effect,  She has symptoms of obstructive sleep apnea, daytime fatigue, frequent snoring, catching breath in her sleep   REVIEW OF SYSTEMS: Full 14 system review of systems performed and notable only for appetite change, hearing loss, ringing in ears, light sensitivity, eye pain, blurry vision, nausea, incontinence of bladder, decreased concentration, depression, anxiety, headaches, numbness  ALLERGIES: No Known Allergies  HOME MEDICATIONS: Current Outpatient Prescriptions on File Prior to Visit  Medication Sig Dispense Refill  . albuterol (PROVENTIL HFA;VENTOLIN HFA) 108 (90 BASE) MCG/ACT inhaler Inhale 2 puffs into the lungs every 4 (four) hours as needed for wheezing or shortness of breath.      Marland Kitchen albuterol (PROVENTIL) (2.5 MG/3ML) 0.083% nebulizer solution Take 3 mLs (2.5 mg total) by nebulization every 4 (four) hours as needed for wheezing.  75 mL  0  . ALPRAZolam (XANAX) 1 MG tablet Take 1 tablet (1 mg total) by mouth 3 (three) times daily as needed for anxiety.  90 tablet  2  . aspirin 325 MG EC tablet Take 325 mg by mouth at bedtime.       . furosemide (LASIX) 40 MG tablet Take 1 tablet (40 mg total) by mouth 2 (two) times daily as needed. As needed for swelling.  60 tablet  11  . insulin lispro (HUMALOG) 100 UNIT/ML injection Inject 75 Units into the skin 3 (three) times daily with meals.       . insulin regular (NOVOLIN R,HUMULIN R) 100 units/mL injection Inject 75 Units into the skin 3 (three) times daily before meals. Humulin      .  metFORMIN (GLUCOPHAGE) 500 MG tablet Take 1,000 mg by mouth daily after supper.       . Multiple Vitamin (MULTIVITAMIN WITH MINERALS) TABS Take 1 tablet by mouth at bedtime.      . sertraline (ZOLOFT) 100 MG tablet Take 2 tablets (200 mg total) by mouth daily.  180 tablet   3  . simvastatin (ZOCOR) 40 MG tablet TAKE 1 TABLET IN THE       EVENING  90 tablet  0  . traMADol (ULTRAM) 50 MG tablet Take 1 tablet (50 mg total) by mouth every 6 (six) hours as needed for moderate pain.  30 tablet  1     PAST MEDICAL HISTORY: Past Medical History  Diagnosis Date  . Depression   . Hyperlipidemia   . TIA (transient ischemic attack)     in 2007,2008, and 2011  . Urosepsis 08-2009    with Klebsiella  . Breast mass in female   . Stroke   . Asthma     seasonal   . Diabetes mellitus     sees Dr. Chalmers Guzman, diagnosed 2003  . Kidney infection     - hosp. [facility]- septic- 10/2009  . Guzman     breast, sees Dr. Jana Guzman , Left - 11/2007  . Arthritis     back & ankles   . Anxiety     per pt. - mild   . Peripheral neuropathy     R sided numbness- face & jaw  . HA (headache)     PAST SURGICAL HISTORY: Past Surgical History  Procedure Laterality Date  . Foot surgery      plantar fasciitis  . Nerve graft  06-01-08    cadaver graft to right inferior alveolar nerve  in [place]   . Abdominal hysterectomy  08/2004  . Breast surgery  11/2007    left ductal carcinoma in situ  . Breast biopsy  06/22/2011    Procedure: BREAST BIOPSY;  Surgeon: Cheyenne Klein, MD;  Location: [facility];  Service: General;  Laterality: Right;    FAMILY HISTORY: Family History  Problem Relation Age of Onset  . Guzman Mother     breast  . Guzman Father     lung  . Guzman Sister     colon  . Guzman Maternal Aunt     breast - both  . Anesthesia problems Neg Hx     SOCIAL HISTORY:  History   Social History  . Marital Status: Married    Spouse Name: Cheyenne Guzman    Number of Children: 2  . Years of Education: 23   Occupational History    [employer] . Cheyenne Guzman   Social History Main Topics  . Smoking status: Former Smoker    Quit date: 02/18/1997  . Smokeless tobacco: Never Used  . Alcohol Use: Yes     Comment: rare  . Drug Use: No  . Sexual Activity: Not on file   Other Topics Concern  . Not  on file   Social History Narrative   Patient lives at home with her husband Cheyenne Guzman) and two daughters.    Patient works for [employer] full time patient is on medical leave at this time.   Education two years of college.   Right handed.   Caffeine two times per day.     PHYSICAL EXAM   Filed Vitals:   07/31/13 0921  Height: '5\' 7"'  (1.702 m)  Weight: 375 lb (170.099 kg)    Not recorded  Body mass index is 58.72 kg/(m^2).   Generalized: In no acute distress  Neck: Supple, no carotid bruits   Cardiac: Regular rate rhythm  Pulmonary: Clear to auscultation bilaterally  Musculoskeletal: No deformity  Neurological examination  Mentation: Obesity, Alert oriented to time, place, history taking, and causual conversation, crying during interview   Cranial nerve II-XII: Pupils were equal round reactive to light. Extraocular movements were full.  Visual field were full on confrontational test. Bilateral fundi edge was mildly blurred.  Facial sensation and strength were normal. Hearing was intact to finger rubbing bilaterally. Uvula tongue midline.  Head turning and shoulder shrug and were normal and symmetric.Tongue protrusion into cheek strength was normal.  Motor: Normal tone, bulk and strength.  Sensory:  length dependent decreased  fine touch, pinprick to above ankle level, absent vibratory sensation at toes.  Coordination: Normal finger to nose, heel-to-shin bilaterally there was no truncal ataxia  Gait: Rising up from seated position without assistance, steady, big body habitus  Romberg signs: Negative  Deep tendon reflexes: Brachioradialis 2/2, biceps 2/2, triceps 2/2, patellar 2/2, Achilles trace, plantar responses were flexor bilaterally.   DIAGNOSTIC DATA (LABS, IMAGING, TESTING) - I reviewed patient records, labs, notes, testing and imaging myself where available.  Lab Results  Component Value Date   WBC 7.2 11/27/2012   HGB 13.9 11/27/2012   HCT 42.9  11/27/2012   MCV 80.6 11/27/2012   PLT 199 11/27/2012      Component Value Date/Time   NA 142 06/25/2013 1037   NA 138 11/27/2012 1057   K 4.6 06/25/2013 1037   CL 101 06/25/2013 1037   CO2 18 06/25/2013 1037   GLUCOSE 173* 06/25/2013 1037   GLUCOSE 213* 11/27/2012 1057   BUN 14 06/25/2013 1037   BUN 11 11/27/2012 1057   CREATININE 0.97 06/25/2013 1037   CREATININE 0.93 02/01/2012 1317   CALCIUM 9.5 06/25/2013 1037   PROT 7.3 06/25/2013 1037   PROT 7.2 02/01/2012 1317   ALBUMIN 4.4 02/01/2012 1317   AST 21 06/25/2013 1037   ALT 15 06/25/2013 1037   ALKPHOS 85 06/25/2013 1037   BILITOT 0.4 06/25/2013 1037   GFRNONAA 69 06/25/2013 1037   GFRAA 80 06/25/2013 1037   Lab Results  Component Value Date   CHOL 148 11/28/2012   HDL 31* 11/28/2012   LDLCALC 78 11/28/2012   LDLDIRECT 189.7 11/27/2006   TRIG 193* 11/28/2012   CHOLHDL 4.8 11/28/2012   Lab Results  Component Value Date   HGBA1C 7.7* 11/28/2012   Lab Results  Component Value Date   VITAMINB12 673 06/25/2013   Lab Results  Component Value Date   TSH 4.280 06/25/2013      ASSESSMENT AND PLAN  CLARYCE FRIEL is a 48 y.o. female complains of  new onset frequent headaches, with migraine features, also complicated by blurry vision, she has past medical history of diabetes, obesity, normal MRI of the brain, Topamax has been helpful, but she complains of significant side effect, including drowsiness, difficulty focusing, worsening bilateral feet and hand paresthesia, alternating of the taste, she also complains symptoms suggestive of obstructive sleep apnea, worsening depression anxiety  1 I will switch her to extended release Topamax Trokendi 147m 2 tabs po qhs 2. Maxalt as needed. 3. EMG/NCS 4. Sleep study. 5. RTC in 2 months      YMarcial Pacas M.D. Ph.D.  [facility] [address] [place] [phone]

## 2013-08-01 LAB — URINALYSIS
Bilirubin, UA: NEGATIVE
GLUCOSE, UA: NEGATIVE
Ketones, UA: NEGATIVE
LEUKOCYTES UA: NEGATIVE
Nitrite, UA: NEGATIVE
PH UA: 5.5 (ref 5.0–7.5)
PROTEIN UA: NEGATIVE
RBC, UA: NEGATIVE
Specific Gravity, UA: 1.021 (ref 1.005–1.030)
Urobilinogen, Ur: 0.2 mg/dL (ref 0.0–1.9)

## 2013-08-01 LAB — C-REACTIVE PROTEIN: CRP: 20.3 mg/L — ABNORMAL HIGH (ref 0.0–4.9)

## 2013-08-01 LAB — SEDIMENTATION RATE: Sed Rate: 25 mm/hr (ref 0–32)

## 2013-08-04 ENCOUNTER — Other Ambulatory Visit: Payer: Self-pay

## 2013-08-04 ENCOUNTER — Telehealth: Payer: Self-pay | Admitting: Neurology

## 2013-08-04 DIAGNOSIS — R51 Headache: Secondary | ICD-10-CM

## 2013-08-04 DIAGNOSIS — R519 Headache, unspecified: Secondary | ICD-10-CM

## 2013-08-04 DIAGNOSIS — R400 Somnolence: Secondary | ICD-10-CM

## 2013-08-04 DIAGNOSIS — G4733 Obstructive sleep apnea (adult) (pediatric): Secondary | ICD-10-CM

## 2013-08-04 MED ORDER — TOPIRAMATE ER 100 MG PO CAP24: 200.0000 mg | ORAL_CAPSULE | Freq: Every day | ORAL | Status: DC

## 2013-08-04 NOTE — Telephone Encounter (Signed)
Pharmacy requests 90 day Rx  

## 2013-08-04 NOTE — Progress Notes (Signed)
Quick Note:  Shared normal labs with patient and she verbalized understanding ______

## 2013-08-04 NOTE — Telephone Encounter (Signed)
..   Dr. Marcial Pacas is referring Cheyenne Guzman, 48 y.o. y/o female, for an attended sleep study.  Wt: 375 lbs Ht: 67 in. BMI: 58.72  Diagnoses: Morbid Obesity Headache Hyperlipidemia Asthma Witnessed Apneas Fatigue Snoring  Medication List: Current Outpatient Prescriptions  Medication Sig Dispense Refill  . albuterol (PROVENTIL HFA;VENTOLIN HFA) 108 (90 BASE) MCG/ACT inhaler Inhale 2 puffs into the lungs every 4 (four) hours as needed for wheezing or shortness of breath.      Marland Kitchen albuterol (PROVENTIL) (2.5 MG/3ML) 0.083% nebulizer solution Take 3 mLs (2.5 mg total) by nebulization every 4 (four) hours as needed for wheezing.  75 mL  0  . ALPRAZolam (XANAX) 1 MG tablet Take 1 tablet (1 mg total) by mouth 3 (three) times daily as needed for anxiety.  90 tablet  2  . aspirin 325 MG EC tablet Take 325 mg by mouth at bedtime.       . butalbital-acetaminophen-caffeine (FIORICET) 50-325-40 MG per tablet Take 1-2 tablets by mouth every 6 (six) hours as needed for headache.  30 tablet  3  . furosemide (LASIX) 40 MG tablet Take 1 tablet (40 mg total) by mouth 2 (two) times daily as needed. As needed for swelling.  60 tablet  11  . insulin lispro (HUMALOG) 100 UNIT/ML injection Inject 75 Units into the skin 3 (three) times daily with meals.       . insulin regular (NOVOLIN R,HUMULIN R) 100 units/mL injection Inject 75 Units into the skin 3 (three) times daily before meals. Humulin      . metFORMIN (GLUCOPHAGE) 500 MG tablet Take 1,000 mg by mouth daily after supper.       . Multiple Vitamin (MULTIVITAMIN WITH MINERALS) TABS Take 1 tablet by mouth at bedtime.      . rizatriptan (MAXALT-MLT) 5 MG disintegrating tablet Take 1 tablet (5 mg total) by mouth as needed for migraine. May repeat in 2 hours if needed  15 tablet  6  . sertraline (ZOLOFT) 100 MG tablet Take 2 tablets (200 mg total) by mouth daily.  180 tablet  3  . simvastatin (ZOCOR) 40 MG tablet TAKE 1 TABLET IN THE       EVENING  90 tablet  0   . topiramate (TOPAMAX) 100 MG tablet 1/2 po bid xone week, then one tab bid  60 tablet  12  . Topiramate ER 100 MG CP24 Take 200 mg by mouth at bedtime.  60 capsule  12  . traMADol (ULTRAM) 50 MG tablet Take 1 tablet (50 mg total) by mouth every 6 (six) hours as needed for moderate pain.  30 tablet  1   No current facility-administered medications for this visit.    This patient presents to Dr. Marcial Pacas in follow up for evaluation of Migraines.   Super morbidly obese pt with hx of headaches has complaint of chronic daytime fatigue, frequent snoring, and apneic events.  She awakens from sleep choking, gasping.  Please review for an attended sleep study.  Insurance:  [employer] - Prior authorization is not required.

## 2013-08-05 NOTE — Telephone Encounter (Signed)
Sleep study request review: This patient has an underlying medical history of morbid obesity, recurrent migraine headaches, asthma and hyperlipidemia and is referred by Dr. Krista Blue for an attended sleep study due to a report of snoring, gasping while asleep, witnessed apneas, and daytime somnolence. I will order a split-night sleep study and see the patient in sleep medicine consultation afterwards. Star Age, MD, PhD [facility])

## 2013-08-06 ENCOUNTER — Encounter (INDEPENDENT_AMBULATORY_CARE_PROVIDER_SITE_OTHER): Payer: BC Managed Care – PPO

## 2013-08-06 ENCOUNTER — Ambulatory Visit (INDEPENDENT_AMBULATORY_CARE_PROVIDER_SITE_OTHER): Payer: BC Managed Care – PPO | Admitting: Neurology

## 2013-08-06 DIAGNOSIS — R209 Unspecified disturbances of skin sensation: Secondary | ICD-10-CM

## 2013-08-06 DIAGNOSIS — Z0289 Encounter for other administrative examinations: Secondary | ICD-10-CM

## 2013-08-06 DIAGNOSIS — G4733 Obstructive sleep apnea (adult) (pediatric): Secondary | ICD-10-CM

## 2013-08-06 NOTE — Procedures (Signed)
   NCS (NERVE CONDUCTION STUDY) WITH EMG (ELECTROMYOGRAPHY) REPORT   STUDY DATE: July 16th 2015 PATIENT NAME: Cheyenne Guzman DOB: 09/08/1965 MRN: [id number]    TECHNOLOGIST: Laretta Alstrom ELECTROMYOGRAPHER: Marcial Pacas M.D.  CLINICAL INFORMATION:   48 years old obese female, with past medical history of diabetes, presenting with bilateral lower extremity paresthesia, shooting pain to right leg after prolonged sitting. Bilateral hands paresthesia  FINDINGS: NERVE CONDUCTION STUDY: Bilateral peroneal sensory responses were absent. Left peroneal motor response was normal, right peroneal motor response showed a mildly decreased C. map amplitude, otherwise normal distal latency, and conduction velocity.  Bilateral tibial motor responses showed severely decreased C. map amplitude, with normal distal latency, conduction velocity.  Bilateral tibial H. reflexes were absent.  Bilateral median sensory responses were absent. Bilateral median motor responses showed prolonged distal latency, right side is severe, left side is moderate, with preserved C. map amplitude, conduction velocity. F wave latency was prolonged, right worse than left .  Bilateral ulnar sensory and motor responses were normal .  NEEDLE ELECTROMYOGRAPHY: Selected needle examination was performed at right lower extremity muscles, right lumbosacral paraspinal muscles, right abductor pollicis brevis .  Needle examination of right tibialis anterior, tibialis posterior, peroneal longus, medial gastrocnemius, vastus lateralis, rectus femoris, biceps femoris short head was normal,   There was increased insertion activity at right lumbosacral paraspinals, right L4, L5, S1.  Right abductor pollicis brevis,  increased insertion activity, 2 plus spontaneous activity, enlarged complex motor unit potential, with decreased recruitment patterns  IMPRESSION:   This is an abnormal study. There is electrodiagnostic evidence of length  dependent axonal, sensory more than motor polyneuropathy, most consistent with her past medical history of diabetes, in addition, there was also evidence of bilateral median neuropathy across the wrist, consistent with moderate to severe bilateral carpal tunnel syndromes, right worse than left.   INTERPRETING PHYSICIAN:   Marcial Pacas M.D. Ph.D. [facility] [address] [place], [place] [address] [phone]

## 2013-08-17 DIAGNOSIS — Z0289 Encounter for other administrative examinations: Secondary | ICD-10-CM

## 2013-08-19 ENCOUNTER — Telehealth: Payer: Self-pay | Admitting: Neurology

## 2013-08-19 NOTE — Telephone Encounter (Signed)
Left message for Dr. Clydell Hakim and asked if he had forms for [place] to complete with restrictions or limitations, or a continuation of benefits form.  I relayed that I cannot find that we have completed and STD forms but that the doctor did write a letter keeping her out of work until 07-31-13.  The patient had a follow up visit on that day and he is welcome to those notes.  Told him to return calls with specific requests.

## 2013-08-19 NOTE — Telephone Encounter (Signed)
Dr. Lamarr Lulas Sr. Medical Director at [facility] [phone].  Requesting patient's condition as to the ability to perform work duties.  Please return call anytime and can leave message if not available.

## 2013-09-16 ENCOUNTER — Encounter: Payer: Self-pay | Admitting: Neurology

## 2013-09-18 ENCOUNTER — Ambulatory Visit (INDEPENDENT_AMBULATORY_CARE_PROVIDER_SITE_OTHER): Payer: BC Managed Care – PPO | Admitting: Neurology

## 2013-09-18 DIAGNOSIS — G4710 Hypersomnia, unspecified: Secondary | ICD-10-CM

## 2013-09-18 DIAGNOSIS — R519 Headache, unspecified: Secondary | ICD-10-CM

## 2013-09-18 DIAGNOSIS — R51 Headache: Secondary | ICD-10-CM

## 2013-09-18 DIAGNOSIS — G4733 Obstructive sleep apnea (adult) (pediatric): Secondary | ICD-10-CM

## 2013-09-18 DIAGNOSIS — R400 Somnolence: Secondary | ICD-10-CM

## 2013-09-22 DIAGNOSIS — G4730 Sleep apnea, unspecified: Secondary | ICD-10-CM

## 2013-09-22 HISTORY — DX: Sleep apnea, unspecified: G47.30

## 2013-09-24 ENCOUNTER — Encounter: Payer: Self-pay | Admitting: Neurology

## 2013-09-24 ENCOUNTER — Ambulatory Visit (INDEPENDENT_AMBULATORY_CARE_PROVIDER_SITE_OTHER): Payer: BC Managed Care – PPO | Admitting: Neurology

## 2013-09-24 VITALS — BP 142/75 | HR 67 | Ht 67.0 in | Wt 370.0 lb

## 2013-09-24 DIAGNOSIS — M79604 Pain in right leg: Secondary | ICD-10-CM

## 2013-09-24 DIAGNOSIS — M79609 Pain in unspecified limb: Secondary | ICD-10-CM

## 2013-09-24 DIAGNOSIS — G43909 Migraine, unspecified, not intractable, without status migrainosus: Secondary | ICD-10-CM

## 2013-09-24 DIAGNOSIS — G4733 Obstructive sleep apnea (adult) (pediatric): Secondary | ICD-10-CM

## 2013-09-24 MED ORDER — OXCARBAZEPINE ER 150 MG PO TB24: 150.0000 mg | ORAL_TABLET | Freq: Every day | ORAL | Status: DC

## 2013-09-24 MED ORDER — DICLOFENAC POTASSIUM(MIGRAINE) 50 MG PO PACK: 50.0000 mg | PACK | ORAL | Status: DC | PRN

## 2013-09-24 NOTE — Progress Notes (Signed)
PATIENT: MARTHA ELLERBY DOB: December 12, 1965  HISTORICAL  SHAREENA NUSZ is a 48 right-handed Caucasian female, referred by ENT Dr. Constance Holster, and her primary care physician Dr. Sarajane Jews for evaluation of headaches with associated dizziness, nausea, neck pain, pressure behind eyes, anxiety, blurred vision.  She had a past medical history of obesity, occasionally migraine the past, hyperlipidemia, depression, reported excessive stress recently, a lot of family member is sick.  She reported increased frequency of migraines since April,  3 weeks history constant daily headaches, left retro-orbital area, left parietal, occipital region, pounding headaches, with associated light sensitivity, nauseous, movements make it worse, dizziness, lasting couple hours, multiple episode in a day,  She also complains of dizziness, spinning sensation, decreased hearing, She was evaluated by Dr. Constance Holster in Jun 19 2013, they will was bilateral symmetric high frequency sensory neural no hearing loss, otherwise normal exam,  She is to take off work as a constant past couple weeks because of her constant headaches, she also complains of blurry vision,  UPDATE July 10th 2015:  Per patient, she was evaluated by her ophthalmologist Dr. Sherral Hammers, with evidence of mild diabetic retinopathy, but otherwise normal, MRI of the brain was normal.  Lab showed elevated CRP 26, ESR 35,  Normal TSH, B12.  She reported moderate improvement 50% with Topamax, but complains of side effect from medications, includes urinary urgency, fatigue, blurry vision, slurred speech. She also complains of worsening bilateral fingers, and feet paresthesia, could hardly feel the keyboard, recent 3 weeks, she also developed right anterior leg shooting pain, to anterior shin, after prolonged sitting, relieved by standing up, she also complains of worsening depression,  crying during today's clinical visit  She still has daily left retro-orbital area headaches,  pounding, pressure, lasting 30 minutes to one hour, multiple episodes a day, 3-4 times each day, she is very hesitate to take any pain relief medications, worry about the long-term side effect,  She has symptoms of obstructive sleep apnea, daytime fatigue, frequent snoring, catching breath in her sleep  UPDATE Sep 3rd 2015: She still has headaches daily, never tried Maxalt, worry about the side effect, she does have vascular risk factor of obesity, hypertension, diabetes, previous TIA like symptoms. Recent sleep study consistent with obstructive sleep apnea, she has been taking Fioricet as needed for migraine, which has been very helpful, she knows to limit the use to 2-3 times each week  She continued to complains of constant left hand paresthesia, bilateral feet pain, difficulty bearing weight, worsening right groin area shooting pain, triggered by prolonged sitting, standing, walking, she will be evaluated by orthopedic surgeon Dr. Patrice Paradise In September 17    REVIEW OF SYSTEMS: Full 14 system review of systems performed and notable only for appetite change, hearing loss, ringing in ears, light sensitivity, eye pain, blurry vision, nausea, incontinence of bladder, decreased concentration, depression, anxiety, headaches, numbness  ALLERGIES: No Known Allergies  HOME MEDICATIONS: Current Outpatient Prescriptions on File Prior to Visit  Medication Sig Dispense Refill  . albuterol (PROVENTIL HFA;VENTOLIN HFA) 108 (90 BASE) MCG/ACT inhaler Inhale 2 puffs into the lungs every 4 (four) hours as needed for wheezing or shortness of breath.      Marland Kitchen albuterol (PROVENTIL) (2.5 MG/3ML) 0.083% nebulizer solution Take 3 mLs (2.5 mg total) by nebulization every 4 (four) hours as needed for wheezing.  75 mL  0  . ALPRAZolam (XANAX) 1 MG tablet Take 1 tablet (1 mg total) by mouth 3 (three) times daily as needed for  anxiety.  90 tablet  2  . aspirin 325 MG EC tablet Take 325 mg by mouth at bedtime.       .  furosemide (LASIX) 40 MG tablet Take 1 tablet (40 mg total) by mouth 2 (two) times daily as needed. As needed for swelling.  60 tablet  11  . insulin lispro (HUMALOG) 100 UNIT/ML injection Inject 75 Units into the skin 3 (three) times daily with meals.       . insulin regular (NOVOLIN R,HUMULIN R) 100 units/mL injection Inject 75 Units into the skin 3 (three) times daily before meals. Humulin      . metFORMIN (GLUCOPHAGE) 500 MG tablet Take 1,000 mg by mouth daily after supper.       . Multiple Vitamin (MULTIVITAMIN WITH MINERALS) TABS Take 1 tablet by mouth at bedtime.      . sertraline (ZOLOFT) 100 MG tablet Take 2 tablets (200 mg total) by mouth daily.  180 tablet  3  . simvastatin (ZOCOR) 40 MG tablet TAKE 1 TABLET IN THE       EVENING  90 tablet  0  . traMADol (ULTRAM) 50 MG tablet Take 1 tablet (50 mg total) by mouth every 6 (six) hours as needed for moderate pain.  30 tablet  1     PAST MEDICAL HISTORY: Past Medical History  Diagnosis Date  . Depression   . Hyperlipidemia   . TIA (transient ischemic attack)     in 2007,2008, and 2011  . Urosepsis 08-2009    with Klebsiella  . Breast mass in female   . Stroke   . Asthma     seasonal   . Diabetes mellitus     sees Dr. Chalmers Cater, diagnosed 2003  . Kidney infection     - hosp. [facility]- septic- 10/2009  . Cancer     breast, sees Dr. Jana Hakim , Left - 11/2007  . Arthritis     back & ankles   . Anxiety     per pt. - mild   . Peripheral neuropathy     R sided numbness- face & jaw  . HA (headache)     PAST SURGICAL HISTORY: Past Surgical History  Procedure Laterality Date  . Foot surgery      plantar fasciitis  . Nerve graft  06-01-08    cadaver graft to right inferior alveolar nerve  in [place]   . Abdominal hysterectomy  08/2004  . Breast surgery  11/2007    left ductal carcinoma in situ  . Breast biopsy  06/22/2011    Procedure: BREAST BIOPSY;  Surgeon: Stark Klein, MD;  Location: [place];  Service: General;  Laterality: Right;     FAMILY HISTORY: Family History  Problem Relation Age of Onset  . Cancer Mother     breast  . Cancer Father     lung  . Cancer Sister     colon  . Cancer Maternal Aunt     breast - both  . Anesthesia problems Neg Hx     SOCIAL HISTORY:  History   Social History  . Marital Status: Married    Spouse Name: [name]    Number of Children: 2  . Years of Education: 77   Occupational History    [employer]   Social History Main Topics  . Smoking status: Former Smoker    Quit date: 02/18/1997  . Smokeless tobacco: Never Used  . Alcohol Use: Yes     Comment: rare  .  Drug Use: No  . Sexual Activity: Not on file   Other Topics Concern  . Not on file   Social History Narrative   Patient lives at home with her husband [name]) and two daughters.    Patient works for [employer] full time patient is on medical leave at this time.   Education two years of college.   Right handed.   Caffeine two times per day.     PHYSICAL EXAM   Filed Vitals:   09/24/13 1125  BP: 142/75  Pulse: 67  Height: _0  (1.702 m)  Weight: 370 lb (167.831 kg)    Not recorded    Body mass index is 57.94 kg/(m^2).   Generalized: In no acute distress  Neck: Supple, no carotid bruits   Cardiac: Regular rate rhythm  Pulmonary: Clear to auscultation bilaterally  Musculoskeletal: No deformity  Neurological examination  Mentation: Obesity, Alert oriented to time, place, history taking, and causual conversation, crying during interview   Cranial nerve II-XII: Pupils were equal round reactive to light. Extraocular movements were full.  Visual field were full on confrontational test. Bilateral fundi edge was mildly blurred.  Facial sensation and strength were normal. Hearing was intact to finger rubbing bilaterally. Uvula tongue midline.  Head turning and shoulder shrug and were normal and symmetric.Tongue protrusion into cheek strength was normal.  Motor: Normal tone, bulk and  strength.  Sensory:  length dependent decreased  fine touch, pinprick to above ankle level, absent vibratory sensation at toes.  Coordination: Normal finger to nose, heel-to-shin bilaterally there was no truncal ataxia  Gait: Rising up from seated position without assistance, steady, big body habitus  Romberg signs: Negative  Deep tendon reflexes: Brachioradialis 2/2, biceps 2/2, triceps 2/2, patellar 2/2, Achilles trace, plantar responses were flexor bilaterally.   DIAGNOSTIC DATA (LABS, IMAGING, TESTING) - I reviewed patient records, labs, notes, testing and imaging myself where available.  Lab Results  Component Value Date   WBC 7.2 11/27/2012   HGB 13.9 11/27/2012   HCT 42.9 11/27/2012   MCV 80.6 11/27/2012   PLT 199 11/27/2012      Component Value Date/Time   NA 142 06/25/2013 1037   NA 138 11/27/2012 1057   K 4.6 06/25/2013 1037   CL 101 06/25/2013 1037   CO2 18 06/25/2013 1037   GLUCOSE 173* 06/25/2013 1037   GLUCOSE 213* 11/27/2012 1057   BUN 14 06/25/2013 1037   BUN 11 11/27/2012 1057   CREATININE 0.97 06/25/2013 1037   CREATININE 0.93 02/01/2012 1317   CALCIUM 9.5 06/25/2013 1037   PROT 7.3 06/25/2013 1037   PROT 7.2 02/01/2012 1317   ALBUMIN 4.4 02/01/2012 1317   AST 21 06/25/2013 1037   ALT 15 06/25/2013 1037   ALKPHOS 85 06/25/2013 1037   BILITOT 0.4 06/25/2013 1037   GFRNONAA 69 06/25/2013 1037   GFRAA 80 06/25/2013 1037   Lab Results  Component Value Date   CHOL 148 11/28/2012   HDL 31* 11/28/2012   LDLCALC 78 11/28/2012   LDLDIRECT 189.7 11/27/2006   TRIG 193* 11/28/2012   CHOLHDL 4.8 11/28/2012   Lab Results  Component Value Date   HGBA1C 7.7* 11/28/2012   Lab Results  Component Value Date   VITAMINB12 673 06/25/2013   Lab Results  Component Value Date   TSH 4.280 06/25/2013    ASSESSMENT AND PLAN  TYSHAWNA ALARID is a 48 y.o. female complains of  new onset frequent headaches, with migraine features, also complicated by blurry  vision, she has past medical history of diabetes,  obesity, normal MRI of the brain, Topamax has been helpful, but she complains of significant side effect, including drowsiness, difficulty focusing, worsening bilateral feet and hand paresthesia, alternating of the taste, she also complains symptoms suggestive of obstructive sleep apnea, worsening depression anxiety  1 I will switch her to extended release Topamax, Trokendi 139m 2 tabs po qhs 2. she will be evaluated by orthopedic surgeon in September 17 for right hip pain,. 3. extended release Trileptal 150 mg every night for neuropathic pain diabetic peripheral neuropathy 4. Camia as needed for migraine,   YMarcial Pacas M.D. Ph.D.  [facility] [address] [place] [phone]

## 2013-10-02 ENCOUNTER — Telehealth: Payer: Self-pay | Admitting: Neurology

## 2013-10-02 DIAGNOSIS — G4733 Obstructive sleep apnea (adult) (pediatric): Secondary | ICD-10-CM

## 2013-10-02 NOTE — Telephone Encounter (Signed)
Please call and notify patient that the recent sleep study confirmed the diagnosis of OSA. She did well with CPAP during the study with significant improvement of the respiratory events. Therefore, I would like start the patient on CPAP at home. I placed the order in the chart.   Arrange for CPAP set up at home through a DME company of patient's choice and fax/route report to PCP and referring MD (if other than PCP).   The patient will also need a follow up appointment with me in 6-8 weeks post set up that has to be scheduled; help the patient schedule this (in a follow-up slot).   Please re-enforce the importance of compliance with treatment and the need for [place] to monitor compliance data.   Once you have spoken to the patient and scheduled the return appointment, you may close this encounter, thanks,   Star Age, MD, PhD [facility] ([place])

## 2013-10-05 ENCOUNTER — Other Ambulatory Visit: Payer: Self-pay | Admitting: Orthopaedic Surgery

## 2013-10-05 NOTE — Telephone Encounter (Signed)
Called and informed patient of split night results.  Pt was instructed that a copy of results would be sent to Dr. Krista Blue.  Pt requested to stop by office same day and pick up copy of test results.  Pt was informed she would be referred to a DME company for her to obtain her CPAP equipment for home use.  Pt was happy to get results and anxious to start PAP use.

## 2013-10-06 ENCOUNTER — Encounter: Payer: Self-pay | Admitting: Neurology

## 2013-10-07 NOTE — H&P (Signed)
Cheyenne Guzman is an 48 y.o. female.   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 She is also the sister of Cheyenne Guzman who we know well. Cheyenne Guzman is here mostly about her hands. She has numbness in both of them and cannot grip things or type reliably at this point. Her neurologist obtained some nerve conduction studies which show severe carpal tunnel syndrome. She has been treated in the past with braces. She thinks things are worse since she started Topamax for headaches although the medication did cure her headache problem.. The hand problem is severe and constant. She thinks it is getting worse. She's had injections for this problem as well.  Imaging/Tests: I reviewed a nerve conduction study and EMG done at [facility] neurolog[facility] on 07/27/13. These show severe bilateral carpal tunnel syndrome.  Past Medical History  Diagnosis Date  . Depression   . Hyperlipidemia   . TIA (transient ischemic attack)     in 2007,2008, and 2011  . Urosepsis 08-2009    with Klebsiella  . Breast mass in female   . Stroke   . Asthma     seasonal   . Diabetes mellitus     sees Dr. Chalmers Cater, diagnosed 2003  . Kidney infection     - hosp. [facility]- septic- 10/2009  . Cancer     breast, sees Dr. Jana Hakim , Left - 11/2007  . Arthritis     back & ankles   . Anxiety     per pt. - mild   . Peripheral neuropathy     R sided numbness- face & jaw  . HA (headache)     Past Surgical History  Procedure Laterality Date  . Foot surgery      plantar fasciitis  . Nerve graft  06-01-08    cadaver graft to right inferior alveolar nerve  in [place]   . Abdominal hysterectomy  08/2004  . Breast surgery  11/2007    left ductal carcinoma in situ  . Breast biopsy  06/22/2011    Procedure: BREAST BIOPSY;  Surgeon: Stark Klein, MD;  Location: [place];  Service: General;  Laterality: Right;    Family History  Problem Relation Age of Onset  . Cancer  Mother     breast  . Cancer Father     lung  . Cancer Sister     colon  . Cancer Maternal Aunt     breast - both  . Anesthesia problems Neg Hx    Social History:  reports that she quit smoking about 16 years ago. She has never used smokeless tobacco. She reports that she drinks alcohol. She reports that she does not use illicit drugs.  Allergies: No Known Allergies  No prescriptions prior to admission    No results found for this or any previous visit (from the past 48 hour(s)). No results found.  Review of Systems  Musculoskeletal: Positive for joint pain.       Right wrist  All other systems reviewed and are negative.   There were no vitals taken for this visit. Physical Exam  Constitutional: She is oriented to person, place, and time. She appears well-developed and well-nourished.  HENT:  Head: Normocephalic and atraumatic.  Eyes: Pupils are equal, round, and reactive to light.  Neck: Normal range of motion.  Cardiovascular: Normal rate and regular rhythm.   Respiratory: Effort normal.  GI: Soft.  Musculoskeletal:  Both hands  are subjectively numb in the median distribution. She has no atrophy. Tinel is negative as is Phalen but her numbness is constant. She has good cervical motion and it does not cause any hand pain. She has no palpable lymphadenopathy. Both of her hips move relatively well.   Neurological: She is alert and oriented to person, place, and time.  Skin: Skin is warm and dry.  Psychiatric: She has a normal mood and affect. Her behavior is normal. Judgment and thought content normal.     Assessment/Plan Assessment: Bilateral severe carpal tunnel syndrome by nerve conduction study. Right side more symptomatic  Plan: Cheyenne Guzman has severe compression of the median nerve on both sides. She has failed injections and bracing. The right side is the most symptomatic and she would like to go forward with a carpal tunnel release there. I reviewed risk of anesthesia,  infection, and neurovascular injury. At some point down the road she might have the opposite side addressed as well. It would also involve a small cyst excision which she has on the volar aspect of that wrist. We will definitely try to do these surgeries with regional anesthetics perhaps a Bier block as she does have some significant breathing issues and probably sleep apnea.  Armie Moren, Larwance Sachs 10/07/2013, 1:42 PM

## 2013-10-08 ENCOUNTER — Encounter (HOSPITAL_BASED_OUTPATIENT_CLINIC_OR_DEPARTMENT_OTHER): Payer: Self-pay | Admitting: *Deleted

## 2013-10-08 DIAGNOSIS — M545 Low back pain, unspecified: Secondary | ICD-10-CM | POA: Insufficient documentation

## 2013-10-08 DIAGNOSIS — G8929 Other chronic pain: Secondary | ICD-10-CM | POA: Insufficient documentation

## 2013-10-08 DIAGNOSIS — M4310 Spondylolisthesis, site unspecified: Secondary | ICD-10-CM | POA: Insufficient documentation

## 2013-10-08 NOTE — Progress Notes (Signed)
Pt just had sleep study-severe-has not started cpap yet-diabetic, obese,to come in for bmet-airway ck-to be done iv reg Needs bmet

## 2013-10-09 ENCOUNTER — Other Ambulatory Visit: Payer: Self-pay | Admitting: Rehabilitation

## 2013-10-09 DIAGNOSIS — M545 Low back pain, unspecified: Secondary | ICD-10-CM

## 2013-10-12 ENCOUNTER — Other Ambulatory Visit: Payer: Self-pay | Admitting: Family Medicine

## 2013-10-12 ENCOUNTER — Encounter (HOSPITAL_BASED_OUTPATIENT_CLINIC_OR_DEPARTMENT_OTHER)
Admission: RE | Admit: 2013-10-12 | Discharge: 2013-10-12 | Disposition: A | Discharge status: Home / Self Care | Payer: BC Managed Care – PPO | Source: Ambulatory Visit | Attending: Orthopaedic Surgery | Admitting: Orthopaedic Surgery

## 2013-10-12 DIAGNOSIS — Z8673 Personal history of transient ischemic attack (TIA), and cerebral infarction without residual deficits: Secondary | ICD-10-CM | POA: Diagnosis not present

## 2013-10-12 DIAGNOSIS — F329 Major depressive disorder, single episode, unspecified: Secondary | ICD-10-CM | POA: Diagnosis not present

## 2013-10-12 DIAGNOSIS — E785 Hyperlipidemia, unspecified: Secondary | ICD-10-CM | POA: Diagnosis not present

## 2013-10-12 DIAGNOSIS — Z87891 Personal history of nicotine dependence: Secondary | ICD-10-CM | POA: Diagnosis not present

## 2013-10-12 DIAGNOSIS — E119 Type 2 diabetes mellitus without complications: Secondary | ICD-10-CM | POA: Diagnosis not present

## 2013-10-12 DIAGNOSIS — F3289 Other specified depressive episodes: Secondary | ICD-10-CM | POA: Diagnosis not present

## 2013-10-12 DIAGNOSIS — M1289 Other specific arthropathies, not elsewhere classified, multiple sites: Secondary | ICD-10-CM | POA: Diagnosis not present

## 2013-10-12 DIAGNOSIS — J45909 Unspecified asthma, uncomplicated: Secondary | ICD-10-CM | POA: Diagnosis not present

## 2013-10-12 DIAGNOSIS — G5600 Carpal tunnel syndrome, unspecified upper limb: Secondary | ICD-10-CM | POA: Diagnosis present

## 2013-10-12 DIAGNOSIS — G609 Hereditary and idiopathic neuropathy, unspecified: Secondary | ICD-10-CM | POA: Diagnosis not present

## 2013-10-12 LAB — BASIC METABOLIC PANEL
Anion gap: 12 (ref 5–15)
BUN: 11 mg/dL (ref 6–23)
CHLORIDE: 104 meq/L (ref 96–112)
CO2: 23 mEq/L (ref 19–32)
Calcium: 9.2 mg/dL (ref 8.4–10.5)
Creatinine, Ser: 0.93 mg/dL (ref 0.50–1.10)
GFR calc Af Amer: 83 mL/min — ABNORMAL LOW (ref >90)
GFR calc non Af Amer: 72 mL/min — ABNORMAL LOW (ref >90)
Glucose, Bld: 110 mg/dL — ABNORMAL HIGH (ref 70–99)
POTASSIUM: 4.9 meq/L (ref 3.7–5.3)
SODIUM: 139 meq/L (ref 137–147)

## 2013-10-12 NOTE — Progress Notes (Signed)
Pt seen by Dr Al Corpus for surgery on 10/13/13.  BMET sent to lab.

## 2013-10-13 ENCOUNTER — Encounter (HOSPITAL_BASED_OUTPATIENT_CLINIC_OR_DEPARTMENT_OTHER): Payer: Self-pay | Admitting: *Deleted

## 2013-10-13 ENCOUNTER — Encounter (HOSPITAL_BASED_OUTPATIENT_CLINIC_OR_DEPARTMENT_OTHER)
Admission: RE | Disposition: A | Discharge status: Home / Self Care | Payer: Self-pay | Source: Ambulatory Visit | Attending: Orthopaedic Surgery

## 2013-10-13 ENCOUNTER — Ambulatory Visit (HOSPITAL_BASED_OUTPATIENT_CLINIC_OR_DEPARTMENT_OTHER)
Admission: RE | Admit: 2013-10-13 | Discharge: 2013-10-13 | Disposition: A | Discharge status: Home / Self Care | Payer: BC Managed Care – PPO | Source: Ambulatory Visit | Attending: Orthopaedic Surgery | Admitting: Orthopaedic Surgery

## 2013-10-13 ENCOUNTER — Encounter (HOSPITAL_BASED_OUTPATIENT_CLINIC_OR_DEPARTMENT_OTHER): Payer: BC Managed Care – PPO | Admitting: Anesthesiology

## 2013-10-13 ENCOUNTER — Ambulatory Visit (HOSPITAL_BASED_OUTPATIENT_CLINIC_OR_DEPARTMENT_OTHER): Payer: BC Managed Care – PPO | Admitting: Anesthesiology

## 2013-10-13 DIAGNOSIS — M1289 Other specific arthropathies, not elsewhere classified, multiple sites: Secondary | ICD-10-CM | POA: Insufficient documentation

## 2013-10-13 DIAGNOSIS — G5600 Carpal tunnel syndrome, unspecified upper limb: Secondary | ICD-10-CM | POA: Diagnosis not present

## 2013-10-13 DIAGNOSIS — F3289 Other specified depressive episodes: Secondary | ICD-10-CM | POA: Insufficient documentation

## 2013-10-13 DIAGNOSIS — F329 Major depressive disorder, single episode, unspecified: Secondary | ICD-10-CM | POA: Insufficient documentation

## 2013-10-13 DIAGNOSIS — Z87891 Personal history of nicotine dependence: Secondary | ICD-10-CM | POA: Insufficient documentation

## 2013-10-13 DIAGNOSIS — Z8673 Personal history of transient ischemic attack (TIA), and cerebral infarction without residual deficits: Secondary | ICD-10-CM | POA: Insufficient documentation

## 2013-10-13 DIAGNOSIS — G5601 Carpal tunnel syndrome, right upper limb: Secondary | ICD-10-CM

## 2013-10-13 DIAGNOSIS — E785 Hyperlipidemia, unspecified: Secondary | ICD-10-CM | POA: Insufficient documentation

## 2013-10-13 DIAGNOSIS — G609 Hereditary and idiopathic neuropathy, unspecified: Secondary | ICD-10-CM | POA: Insufficient documentation

## 2013-10-13 DIAGNOSIS — J45909 Unspecified asthma, uncomplicated: Secondary | ICD-10-CM | POA: Insufficient documentation

## 2013-10-13 DIAGNOSIS — E119 Type 2 diabetes mellitus without complications: Secondary | ICD-10-CM | POA: Insufficient documentation

## 2013-10-13 HISTORY — DX: Adverse effect of unspecified anesthetic, initial encounter: T41.45XA

## 2013-10-13 HISTORY — PX: CARPAL TUNNEL RELEASE: SHX101

## 2013-10-13 HISTORY — DX: Sleep apnea, unspecified: G47.30

## 2013-10-13 LAB — GLUCOSE, CAPILLARY
GLUCOSE-CAPILLARY: 147 mg/dL — AB (ref 70–99)
Glucose-Capillary: 139 mg/dL — ABNORMAL HIGH (ref 70–99)

## 2013-10-13 LAB — POCT HEMOGLOBIN-HEMACUE: HEMOGLOBIN: 13.2 g/dL (ref 12.0–15.0)

## 2013-10-13 SURGERY — CARPAL TUNNEL RELEASE
Anesthesia: Monitor Anesthesia Care | Site: Wrist | Laterality: Right

## 2013-10-13 MED ORDER — MIDAZOLAM HCL 2 MG/2ML IJ SOLN: 1.0000 mg | INTRAMUSCULAR | Status: DC | PRN

## 2013-10-13 MED ORDER — LACTATED RINGERS IV SOLN: INTRAVENOUS | Status: DC

## 2013-10-13 MED ORDER — LIDOCAINE HCL (PF) 0.5 % IJ SOLN
INTRAMUSCULAR | Status: DC | PRN
  Administered 2013-10-13: 40 mg via INTRAVENOUS

## 2013-10-13 MED ORDER — CHLORHEXIDINE GLUCONATE 4 % EX LIQD: 60.0000 mL | Freq: Once | CUTANEOUS | Status: DC

## 2013-10-13 MED ORDER — OXYCODONE HCL 5 MG PO TABS
5.0000 mg | ORAL_TABLET | Freq: Once | ORAL | Status: AC
Start: 2013-10-13 — End: 2013-10-13
  Administered 2013-10-13: 5 mg via ORAL

## 2013-10-13 MED ORDER — CEFAZOLIN SODIUM-DEXTROSE 2-3 GM-% IV SOLR
INTRAVENOUS | Status: AC
  Filled 2013-10-13: qty 50

## 2013-10-13 MED ORDER — FENTANYL CITRATE 0.05 MG/ML IJ SOLN: 25.0000 ug | INTRAMUSCULAR | Status: DC | PRN

## 2013-10-13 MED ORDER — DEXTROSE 5 % IV SOLN
3.0000 g | INTRAVENOUS | Status: AC
  Administered 2013-10-13: 3 g via INTRAVENOUS

## 2013-10-13 MED ORDER — MIDAZOLAM HCL 2 MG/2ML IJ SOLN
INTRAMUSCULAR | Status: AC
  Filled 2013-10-13: qty 2

## 2013-10-13 MED ORDER — OXYCODONE HCL 5 MG PO TABS
ORAL_TABLET | ORAL | Status: AC
  Filled 2013-10-13: qty 1

## 2013-10-13 MED ORDER — ONDANSETRON HCL 4 MG/2ML IJ SOLN
INTRAMUSCULAR | Status: DC | PRN
  Administered 2013-10-13: 4 mg via INTRAVENOUS

## 2013-10-13 MED ORDER — MIDAZOLAM HCL 5 MG/5ML IJ SOLN
INTRAMUSCULAR | Status: DC | PRN
  Administered 2013-10-13: 2 mg via INTRAVENOUS

## 2013-10-13 MED ORDER — ONDANSETRON HCL 4 MG/2ML IJ SOLN: 4.0000 mg | Freq: Once | INTRAMUSCULAR | Status: DC | PRN

## 2013-10-13 MED ORDER — BUPIVACAINE HCL (PF) 0.25 % IJ SOLN
INTRAMUSCULAR | Status: AC
  Filled 2013-10-13: qty 30

## 2013-10-13 MED ORDER — LACTATED RINGERS IV SOLN
INTRAVENOUS | Status: DC
  Administered 2013-10-13 (×2): via INTRAVENOUS

## 2013-10-13 MED ORDER — DEXAMETHASONE SODIUM PHOSPHATE 4 MG/ML IJ SOLN
INTRAMUSCULAR | Status: DC | PRN
  Administered 2013-10-13: 8 mg via INTRAVENOUS

## 2013-10-13 MED ORDER — BUPIVACAINE HCL (PF) 0.5 % IJ SOLN
INTRAMUSCULAR | Status: AC
  Filled 2013-10-13: qty 30

## 2013-10-13 MED ORDER — FENTANYL CITRATE 0.05 MG/ML IJ SOLN
INTRAMUSCULAR | Status: DC | PRN
  Administered 2013-10-13: 50 ug via INTRAVENOUS

## 2013-10-13 MED ORDER — FENTANYL CITRATE 0.05 MG/ML IJ SOLN
INTRAMUSCULAR | Status: AC
  Filled 2013-10-13: qty 2

## 2013-10-13 MED ORDER — OXYCODONE-ACETAMINOPHEN 5-325 MG PO TABS: 1.0000 | ORAL_TABLET | Freq: Four times a day (QID) | ORAL | Status: DC | PRN

## 2013-10-13 MED ORDER — LIDOCAINE HCL (PF) 1 % IJ SOLN
INTRAMUSCULAR | Status: AC
  Filled 2013-10-13: qty 30

## 2013-10-13 MED ORDER — FENTANYL CITRATE 0.05 MG/ML IJ SOLN: 50.0000 ug | INTRAMUSCULAR | Status: DC | PRN

## 2013-10-13 SURGICAL SUPPLY — 51 items
BANDAGE ELASTIC 4 VELCRO ST LF (GAUZE/BANDAGES/DRESSINGS) ×2 IMPLANT
BLADE MINI RND TIP GREEN BEAV (BLADE) ×2 IMPLANT
BLADE SURG 15 STRL LF DISP TIS (BLADE) ×1 IMPLANT
BLADE SURG 15 STRL SS (BLADE) ×1
BNDG ESMARK 4X9 LF (GAUZE/BANDAGES/DRESSINGS) IMPLANT
BNDG GAUZE ELAST 4 BULKY (GAUZE/BANDAGES/DRESSINGS) ×2 IMPLANT
COVER MAYO STAND STRL (DRAPES) ×2 IMPLANT
COVER TABLE BACK 60X90 (DRAPES) ×2 IMPLANT
CUFF TOURNIQUET SINGLE 18IN (TOURNIQUET CUFF) ×2 IMPLANT
DRAPE EXTREMITY TIBURON (DRAPES) ×2 IMPLANT
DRAPE SURG 17X23 STRL (DRAPES) ×2 IMPLANT
DRSG EMULSION OIL 3X3 NADH (GAUZE/BANDAGES/DRESSINGS) ×2 IMPLANT
DRSG PAD ABDOMINAL 8X10 ST (GAUZE/BANDAGES/DRESSINGS) IMPLANT
DURAPREP 26ML APPLICATOR (WOUND CARE) ×2 IMPLANT
ELECT NEEDLE TIP 2.8 STRL (NEEDLE) IMPLANT
ELECT REM PT RETURN 9FT ADLT (ELECTROSURGICAL) ×2
ELECTRODE REM PT RTRN 9FT ADLT (ELECTROSURGICAL) ×1 IMPLANT
GAUZE SPONGE 4X4 12PLY STRL (GAUZE/BANDAGES/DRESSINGS) ×2 IMPLANT
GLOVE BIO SURGEON STRL SZ 6.5 (GLOVE) ×2 IMPLANT
GLOVE BIO SURGEON STRL SZ8 (GLOVE) ×4 IMPLANT
GLOVE BIOGEL PI IND STRL 7.0 (GLOVE) ×1 IMPLANT
GLOVE BIOGEL PI IND STRL 7.5 (GLOVE) ×1 IMPLANT
GLOVE BIOGEL PI IND STRL 8 (GLOVE) ×2 IMPLANT
GLOVE BIOGEL PI INDICATOR 7.0 (GLOVE) ×1
GLOVE BIOGEL PI INDICATOR 7.5 (GLOVE) ×1
GLOVE BIOGEL PI INDICATOR 8 (GLOVE) ×2
GLOVE SURG SS PI 7.0 STRL IVOR (GLOVE) ×2 IMPLANT
GOWN STRL REUS W/ TWL LRG LVL3 (GOWN DISPOSABLE) ×2 IMPLANT
GOWN STRL REUS W/ TWL XL LVL3 (GOWN DISPOSABLE) ×2 IMPLANT
GOWN STRL REUS W/TWL LRG LVL3 (GOWN DISPOSABLE) ×2
GOWN STRL REUS W/TWL XL LVL3 (GOWN DISPOSABLE) ×2
LOOP VESSEL MAXI BLUE (MISCELLANEOUS) IMPLANT
NEEDLE HYPO 25X1 1.5 SAFETY (NEEDLE) IMPLANT
NS IRRIG 1000ML POUR BTL (IV SOLUTION) ×2 IMPLANT
PACK BASIN DAY SURGERY FS (CUSTOM PROCEDURE TRAY) ×2 IMPLANT
PADDING CAST ABS 3INX4YD NS (CAST SUPPLIES)
PADDING CAST ABS 4INX4YD NS (CAST SUPPLIES)
PADDING CAST ABS COTTON 3X4 (CAST SUPPLIES) IMPLANT
PADDING CAST ABS COTTON 4X4 ST (CAST SUPPLIES) IMPLANT
PENCIL BUTTON HOLSTER BLD 10FT (ELECTRODE) ×2 IMPLANT
SPLINT PLASTER CAST XFAST 3X15 (CAST SUPPLIES) ×10 IMPLANT
SPLINT PLASTER XTRA FASTSET 3X (CAST SUPPLIES) ×10
STOCKINETTE 4X48 STRL (DRAPES) ×2 IMPLANT
SUT ETHILON 4 0 PS 2 18 (SUTURE) ×2 IMPLANT
SUT VIC AB 4-0 P-3 18XBRD (SUTURE) IMPLANT
SUT VIC AB 4-0 P3 18 (SUTURE)
SYR BULB 3OZ (MISCELLANEOUS) ×2 IMPLANT
SYR CONTROL 10ML LL (SYRINGE) IMPLANT
TOWEL OR 17X24 6PK STRL BLUE (TOWEL DISPOSABLE) ×2 IMPLANT
TOWEL OR NON WOVEN STRL DISP B (DISPOSABLE) IMPLANT
UNDERPAD 30X30 INCONTINENT (UNDERPADS AND DIAPERS) ×2 IMPLANT

## 2013-10-13 NOTE — Interval H&P Note (Signed)
History and Physical Interval Note:  10/13/2013 12:07 PM  Cheyenne Guzman  has presented today for surgery, with the diagnosis of RIGHT CARAPL TUNNEL SYNDOME  The various methods of treatment have been discussed with the patient and family. After consideration of risks, benefits and other options for treatment, the patient has consented to  Procedure(s): RIGHT CARPAL TUNNEL RELEASE (Right) as a surgical intervention .  The patient's history has been reviewed, patient examined, no change in status, stable for surgery.  I have reviewed the patient's chart and labs.  Questions were answered to the patient's satisfaction.     [name]

## 2013-10-13 NOTE — Transfer of Care (Signed)
Immediate Anesthesia Transfer of Care Note  Patient: Cheyenne Guzman  Procedure(s) Performed: Procedure(s): RIGHT CARPAL TUNNEL RELEASE (Right)  Patient Location: PACU  Anesthesia Type:MAC and Bier block  Level of Consciousness: awake, alert  and oriented  Airway & Oxygen Therapy: Patient Spontanous Breathing and Patient connected to face mask oxygen  Post-op Assessment: Report given to PACU RN and Post -op Vital signs reviewed and stable  Post vital signs: Reviewed and stable  Complications: No apparent anesthesia complications

## 2013-10-13 NOTE — Discharge Instructions (Signed)

## 2013-10-13 NOTE — Anesthesia Postprocedure Evaluation (Signed)
  Anesthesia Post-op Note  Patient: Cheyenne Guzman  Procedure(s) Performed: Procedure(s): RIGHT CARPAL TUNNEL RELEASE (Right)  Patient Location: PACU  Anesthesia Type: MAC, Bier Block   Level of Consciousness: awake, alert  and oriented  Airway and Oxygen Therapy: Patient Spontanous Breathing  Post-op Pain: mild  Post-op Assessment: Post-op Vital signs reviewed  Post-op Vital Signs: Reviewed  Last Vitals:  Filed Vitals:   10/13/13 1345  BP: 138/67  Pulse: 72  Temp:   Resp: 14    Complications: No apparent anesthesia complications

## 2013-10-13 NOTE — Anesthesia Procedure Notes (Signed)
Anesthesia Regional Block:  Bier block (IV Regional)  Pre-Anesthetic Checklist: ,, timeout performed, Correct Patient, Correct Site, Correct Laterality, Correct Procedure,, site marked, surgical consent,, at surgeon's request Needles:  Injection technique: Single-shot  Needle Type: Other      Needle Gauge: 22 and 22 G    Additional Needles: Bier block (IV Regional) Narrative:  Start time: 10/13/2013 1:03 PM End time: 10/13/2013 1:04 PM Injection made incrementally with aspirations every 40 mL.  Performed by: Personally

## 2013-10-13 NOTE — Anesthesia Preprocedure Evaluation (Addendum)
Anesthesia Evaluation  Patient identified by MRN, date of birth, ID band Patient awake    Reviewed: Allergy & Precautions, H&P , NPO status , Patient's Chart, lab work & pertinent test results  Airway Mallampati: II TM Distance: >3 FB Neck ROM: Full    Dental  (+) Teeth Intact, Dental Advisory Given   Pulmonary former smoker,  breath sounds clear to auscultation        Cardiovascular Rhythm:Regular Rate:Normal     Neuro/Psych    GI/Hepatic   Endo/Other  diabetes, Well Controlled, Type 2, Oral Hypoglycemic Agents, Insulin DependentMorbid obesity  Renal/GU      Musculoskeletal   Abdominal   Peds  Hematology   Anesthesia Other Findings   Reproductive/Obstetrics                          Anesthesia Physical Anesthesia Plan  ASA: III  Anesthesia Plan: MAC and Bier Block   Post-op Pain Management:    Induction: Intravenous  Airway Management Planned: Simple Face Mask  Additional Equipment:   Intra-op Plan:   Post-operative Plan: Extubation in OR  Informed Consent: I have reviewed the patients History and Physical, chart, labs and discussed the procedure including the risks, benefits and alternatives for the proposed anesthesia with the patient or authorized representative who has indicated his/her understanding and acceptance.   Dental advisory given  Plan Discussed with: CRNA, Anesthesiologist and Surgeon  Anesthesia Plan Comments:         Anesthesia Quick Evaluation

## 2013-10-13 NOTE — Op Note (Signed)
PRE-OP DIAGNOSIS:  right carpal tunnel syndrome POST-OP DIAGNOSIS:  same PROCEDURE:  right carpal tunnel release ANESTHESIA:  Bier and MAC ATTENDING SURGEON:  Melrose Nakayama MD ASSISTANT:  Loni Dolly PA  INDICATION FOR PROCEDURE:  LIBBEY DUCE is a 48 y.o. female with a long history of hand pain and numbness.  The patient has failed conservative measures of treatment and persists with symptoms which are very limiting.  The patient is offered carpal tunnel release in hopes of improving their situation by relieving pressure on the medial nerve.  Informed operative consent was obtained after discussion of possible complications including reaction to anesthesia, infection, and neurovascular injury.  SUMMARY OF FINDINGS AND PROCEDURE:  Under to above anesthetic a carpal tunnel release was performed.  The patient had a very thickened transverse carpal tunnel ligament which was applying compression to the underlying medial nerve.  This was release under direct visualization.  The skin was closed primarily followed by placement of a sterile dressing and splint with the wrist in slight extension.  DESCRIPTION OF PROCEDURE:  TANGELA DOLLIVER was taken to an operative suite where the above anesthetic was applied.  The patient was then prepped and draped in normal sterile fashion.  An appropriate time out was performed.  After the administration of kefzol pre-operative antibiotic and application and inflation of a tourniquet a small incision was made on the palmar aspect of the palm.  This did not cross the wrist flexion crease and was ulnar to the thenar flexion crease.  Dissection was carried down through palmar fascia to expose the transverse carpal ligament which was released under direct visualization.  The dissection was taken distally towards the transverse arch of vessels and distally through the distal fascia of the forearm.  The wound was irrigated and the skin was closed with nylon.  The tourniquet was  deflated and skin edges became pink immediately and pulses were intact.  Adaptic was applied along with a sterile dressing and a plaster splint with the wrist in slight extension.  Estimated blood loss and intraoperative fluids can be obtained from anesthesia records as can tourniquet time.  DISPOSITION:  LYNEA ROLLISON was taken to recovery room in stable condition.  Plans were to go home same day and I will contact the patient by phone tonight.  The patient will follow-up in about a week in the office.  Romano Stigger G 10/13/2013, 1:27 PM

## 2013-10-14 ENCOUNTER — Encounter (HOSPITAL_BASED_OUTPATIENT_CLINIC_OR_DEPARTMENT_OTHER): Payer: Self-pay | Admitting: Orthopaedic Surgery

## 2013-10-22 ENCOUNTER — Ambulatory Visit
Admission: RE | Admit: 2013-10-22 | Discharge: 2013-10-22 | Disposition: A | Discharge status: Home / Self Care | Payer: BC Managed Care – PPO | Source: Ambulatory Visit | Attending: Rehabilitation | Admitting: Rehabilitation

## 2013-10-22 DIAGNOSIS — M545 Low back pain, unspecified: Secondary | ICD-10-CM

## 2013-11-02 DIAGNOSIS — Q762 Congenital spondylolisthesis: Secondary | ICD-10-CM | POA: Insufficient documentation

## 2013-11-09 ENCOUNTER — Other Ambulatory Visit: Payer: Self-pay | Admitting: Family Medicine

## 2013-11-11 ENCOUNTER — Encounter: Payer: Self-pay | Admitting: Neurology

## 2013-11-12 ENCOUNTER — Other Ambulatory Visit: Payer: Self-pay | Admitting: Orthopaedic Surgery

## 2013-11-18 NOTE — H&P (Signed)
Cheyenne Guzman is an 48 y.o. female.   Chief Complaint: Complaints of left hand numbness, tingling and cyst volar aspect HPI: Cheyenne Guzman is complaining of left hand numbness and tingling carpal tunnel distribution as well as nighttime symptoms.  Recent EMG nerve conduction study positive for severe left hand carpal tunnel syndrome.  She has had a right wrist released recently and has done very well.  Like to proceed on with the left side.  She has been bothered with symptoms for many months.  We have discussed the risks, benefits of carpal tunnel release surgery.  She has been to bracing, anti-inflammatory medicines and a nerve conduction study.  Past Medical History  Diagnosis Date  . Depression   . Hyperlipidemia   . TIA (transient ischemic attack)     in 2007,2008, and 2011  . Urosepsis 08-2009    with Cheyenne Guzman  . Breast mass in female   . Asthma     seasonal   . Diabetes mellitus     sees Dr. Chalmers Cater, diagnosed 2003  . Kidney infection     - hosp. [facility]- septic- 10/2009  . Cancer     breast, sees Dr. Jana Guzman , Left - 11/2007  . Arthritis     back & ankles   . Anxiety     per pt. - mild   . Peripheral neuropathy     R sided numbness- face & jaw  . HA (headache)   . Complication of anesthesia 2013    severe muscle spasms-sore-no doc  . Stroke     tia-no deficit  . Sleep apnea 9/15    severe-just tested-has not started cpap    Past Surgical History  Procedure Laterality Date  . Foot surgery  2000    plantar fasciitis-left  . Nerve graft  06-01-08    cadaver graft to right inferior alveolar nerve  in [place]  . Abdominal hysterectomy  08/2004  . Breast surgery  11/2007    left ductal carcinoma in situ  . Breast biopsy  06/22/2011    Procedure: BREAST BIOPSY;  Surgeon: Cheyenne Klein, MD;  Location: [place];  Service: General;  Laterality: Right;  . Carpal tunnel release Right 10/13/2013    Procedure: RIGHT CARPAL TUNNEL RELEASE;  Surgeon: Cheyenne Dibble, MD;  Location: [place];  Service: Orthopedics;  Laterality: Right;    Family History  Problem Relation Age of Onset  . Cancer Mother     breast  . Cancer Father     lung  . Cancer Sister     colon  . Cancer Maternal Aunt     breast - both  . Anesthesia problems Neg Hx    Social History:  reports that she quit smoking about 16 years ago. She has never used smokeless tobacco. She reports that she drinks alcohol. She reports that she does not use illicit drugs.  Allergies: No Known Allergies  No prescriptions prior to admission    No results found for this or any previous visit (from the past 48 hour(s)). No results found.  Review of Systems  Constitutional: Negative.   HENT: Negative.   Eyes: Negative.   Respiratory: Negative.   Cardiovascular: Negative.   Gastrointestinal: Negative.   Genitourinary: Negative.   Musculoskeletal: Positive for joint pain.  Skin: Negative.   Neurological: Positive for tingling.  Endo/Heme/Allergies: Negative.   Psychiatric/Behavioral: Negative.     There were no vitals taken for this visit. Physical Exam  Constitutional: She  appears well-nourished.  HENT:  Head: Normocephalic.  Eyes: Pupils are equal, round, and reactive to light.  Neck: Normal range of motion.  Cardiovascular: Normal rate.   Respiratory: Effort normal.  GI: Soft.  Musculoskeletal:  Left hand and wrist.  Positive Phalen's test at about 25 seconds.  Positive Tinel's.  Numbness and tingling to the thumb and index finger.  Good wrist motion   Neurological: She is alert.  Skin: Skin is warm.  Psychiatric: She has a normal mood and affect.     Assessment/Plan Assessment: Left carpal tunnel syndrome. Plan we have discussed proceeding with left carpal tunnel release because of her symptoms and positive nerve conduction study. iscussed the risk of anesthesia, infection and nerve related injury and recovery time as well.She has recently had the right carpal tunnel release  done is done very well.  Cheyenne Guzman R 11/18/2013, 11:51 AM

## 2013-11-20 ENCOUNTER — Encounter (HOSPITAL_BASED_OUTPATIENT_CLINIC_OR_DEPARTMENT_OTHER): Payer: Self-pay | Admitting: *Deleted

## 2013-11-20 NOTE — Progress Notes (Signed)
Pt was here 9/15 for rt ctr-bier block-did well-dr crews did block-she got her cpap and is using it-sleeping better-will come in for bmet-had some sinus congestion-will get claritin and mucinex if needed

## 2013-11-24 ENCOUNTER — Ambulatory Visit (HOSPITAL_BASED_OUTPATIENT_CLINIC_OR_DEPARTMENT_OTHER): Payer: BC Managed Care – PPO | Admitting: Anesthesiology

## 2013-11-24 ENCOUNTER — Encounter (HOSPITAL_BASED_OUTPATIENT_CLINIC_OR_DEPARTMENT_OTHER)
Admission: RE | Disposition: A | Discharge status: Home / Self Care | Payer: Self-pay | Source: Ambulatory Visit | Attending: Orthopaedic Surgery

## 2013-11-24 ENCOUNTER — Ambulatory Visit (HOSPITAL_BASED_OUTPATIENT_CLINIC_OR_DEPARTMENT_OTHER)
Admission: RE | Admit: 2013-11-24 | Discharge: 2013-11-24 | Disposition: A | Discharge status: Home / Self Care | Payer: BC Managed Care – PPO | Source: Ambulatory Visit | Attending: Orthopaedic Surgery | Admitting: Orthopaedic Surgery

## 2013-11-24 ENCOUNTER — Encounter (HOSPITAL_BASED_OUTPATIENT_CLINIC_OR_DEPARTMENT_OTHER): Payer: Self-pay | Admitting: *Deleted

## 2013-11-24 DIAGNOSIS — G4730 Sleep apnea, unspecified: Secondary | ICD-10-CM | POA: Diagnosis not present

## 2013-11-24 DIAGNOSIS — E785 Hyperlipidemia, unspecified: Secondary | ICD-10-CM | POA: Diagnosis not present

## 2013-11-24 DIAGNOSIS — Z87891 Personal history of nicotine dependence: Secondary | ICD-10-CM | POA: Insufficient documentation

## 2013-11-24 DIAGNOSIS — E119 Type 2 diabetes mellitus without complications: Secondary | ICD-10-CM | POA: Diagnosis not present

## 2013-11-24 DIAGNOSIS — J45909 Unspecified asthma, uncomplicated: Secondary | ICD-10-CM | POA: Diagnosis not present

## 2013-11-24 DIAGNOSIS — F329 Major depressive disorder, single episode, unspecified: Secondary | ICD-10-CM | POA: Diagnosis not present

## 2013-11-24 DIAGNOSIS — G629 Polyneuropathy, unspecified: Secondary | ICD-10-CM | POA: Diagnosis not present

## 2013-11-24 DIAGNOSIS — G5602 Carpal tunnel syndrome, left upper limb: Secondary | ICD-10-CM | POA: Insufficient documentation

## 2013-11-24 DIAGNOSIS — Z853 Personal history of malignant neoplasm of breast: Secondary | ICD-10-CM | POA: Diagnosis not present

## 2013-11-24 DIAGNOSIS — L728 Other follicular cysts of the skin and subcutaneous tissue: Secondary | ICD-10-CM | POA: Diagnosis not present

## 2013-11-24 DIAGNOSIS — Z8673 Personal history of transient ischemic attack (TIA), and cerebral infarction without residual deficits: Secondary | ICD-10-CM | POA: Diagnosis not present

## 2013-11-24 DIAGNOSIS — M1389 Other specified arthritis, multiple sites: Secondary | ICD-10-CM | POA: Diagnosis not present

## 2013-11-24 HISTORY — PX: EAR CYST EXCISION: SHX22

## 2013-11-24 HISTORY — PX: CARPAL TUNNEL RELEASE: SHX101

## 2013-11-24 LAB — POCT I-STAT, CHEM 8
BUN: 14 mg/dL (ref 6–23)
CALCIUM ION: 1.17 mmol/L (ref 1.12–1.23)
CHLORIDE: 109 meq/L (ref 96–112)
CREATININE: 0.9 mg/dL (ref 0.50–1.10)
Glucose, Bld: 180 mg/dL — ABNORMAL HIGH (ref 70–99)
HCT: 45 % (ref 36.0–46.0)
Hemoglobin: 15.3 g/dL — ABNORMAL HIGH (ref 12.0–15.0)
Potassium: 4.2 mEq/L (ref 3.7–5.3)
Sodium: 142 mEq/L (ref 137–147)
TCO2: 19 mmol/L (ref 0–100)

## 2013-11-24 LAB — GLUCOSE, CAPILLARY: GLUCOSE-CAPILLARY: 159 mg/dL — AB (ref 70–99)

## 2013-11-24 SURGERY — CARPAL TUNNEL RELEASE
Anesthesia: Monitor Anesthesia Care | Site: Wrist | Laterality: Left

## 2013-11-24 MED ORDER — OXYCODONE HCL 5 MG/5ML PO SOLN: 5.0000 mg | Freq: Once | ORAL | Status: DC | PRN

## 2013-11-24 MED ORDER — OXYCODONE-ACETAMINOPHEN 5-325 MG PO TABS: 1.0000 | ORAL_TABLET | Freq: Four times a day (QID) | ORAL | Status: DC | PRN

## 2013-11-24 MED ORDER — FENTANYL CITRATE 0.05 MG/ML IJ SOLN: 25.0000 ug | INTRAMUSCULAR | Status: DC | PRN

## 2013-11-24 MED ORDER — FENTANYL CITRATE 0.05 MG/ML IJ SOLN
INTRAMUSCULAR | Status: AC
  Filled 2013-11-24: qty 4

## 2013-11-24 MED ORDER — BUPIVACAINE-EPINEPHRINE (PF) 0.25% -1:200000 IJ SOLN
INTRAMUSCULAR | Status: AC
  Filled 2013-11-24: qty 30

## 2013-11-24 MED ORDER — LACTATED RINGERS IV SOLN
INTRAVENOUS | Status: DC
  Administered 2013-11-24: 13:00:00 via INTRAVENOUS

## 2013-11-24 MED ORDER — OXYCODONE HCL 5 MG PO TABS: 5.0000 mg | ORAL_TABLET | Freq: Once | ORAL | Status: DC | PRN

## 2013-11-24 MED ORDER — DEXTROSE 5 % IV SOLN
3.0000 g | INTRAVENOUS | Status: AC
  Administered 2013-11-24: 3 g via INTRAVENOUS

## 2013-11-24 MED ORDER — BUPIVACAINE HCL (PF) 0.25 % IJ SOLN
INTRAMUSCULAR | Status: AC
  Filled 2013-11-24: qty 30

## 2013-11-24 MED ORDER — ONDANSETRON HCL 4 MG/2ML IJ SOLN: 4.0000 mg | Freq: Four times a day (QID) | INTRAMUSCULAR | Status: DC | PRN

## 2013-11-24 MED ORDER — FENTANYL CITRATE 0.05 MG/ML IJ SOLN: 50.0000 ug | INTRAMUSCULAR | Status: DC | PRN

## 2013-11-24 MED ORDER — FENTANYL CITRATE 0.05 MG/ML IJ SOLN
INTRAMUSCULAR | Status: DC | PRN
  Administered 2013-11-24 (×3): 25 ug via INTRAVENOUS

## 2013-11-24 MED ORDER — BUPIVACAINE-EPINEPHRINE 0.25% -1:200000 IJ SOLN
INTRAMUSCULAR | Status: DC | PRN
  Administered 2013-11-24: 10 mL

## 2013-11-24 MED ORDER — LACTATED RINGERS IV SOLN: INTRAVENOUS | Status: DC

## 2013-11-24 MED ORDER — LIDOCAINE HCL (PF) 1 % IJ SOLN
INTRAMUSCULAR | Status: AC
  Filled 2013-11-24: qty 30

## 2013-11-24 MED ORDER — MIDAZOLAM HCL 5 MG/5ML IJ SOLN
INTRAMUSCULAR | Status: DC | PRN
  Administered 2013-11-24: 2 mg via INTRAVENOUS

## 2013-11-24 MED ORDER — MIDAZOLAM HCL 2 MG/2ML IJ SOLN
INTRAMUSCULAR | Status: AC
  Filled 2013-11-24: qty 2

## 2013-11-24 MED ORDER — MIDAZOLAM HCL 2 MG/2ML IJ SOLN: 1.0000 mg | INTRAMUSCULAR | Status: DC | PRN

## 2013-11-24 MED ORDER — PROPOFOL 10 MG/ML IV BOLUS
INTRAVENOUS | Status: AC
  Filled 2013-11-24: qty 40

## 2013-11-24 SURGICAL SUPPLY — 60 items
BANDAGE ELASTIC 4 VELCRO ST LF (GAUZE/BANDAGES/DRESSINGS) ×2 IMPLANT
BENZOIN TINCTURE PRP APPL 2/3 (GAUZE/BANDAGES/DRESSINGS) IMPLANT
BLADE MINI RND TIP GREEN BEAV (BLADE) ×2 IMPLANT
BLADE SURG 15 STRL LF DISP TIS (BLADE) ×1 IMPLANT
BLADE SURG 15 STRL SS (BLADE) ×1
BNDG ESMARK 4X9 LF (GAUZE/BANDAGES/DRESSINGS) IMPLANT
BNDG GAUZE ELAST 4 BULKY (GAUZE/BANDAGES/DRESSINGS) ×2 IMPLANT
COVER BACK TABLE 60X90IN (DRAPES) ×2 IMPLANT
COVER MAYO STAND STRL (DRAPES) ×2 IMPLANT
CUFF TOURNIQUET SINGLE 18IN (TOURNIQUET CUFF) IMPLANT
CUFF TOURNIQUET SINGLE 24IN (TOURNIQUET CUFF) IMPLANT
CUFF TOURNIQUET SINGLE 34IN LL (TOURNIQUET CUFF) IMPLANT
DECANTER SPIKE VIAL GLASS SM (MISCELLANEOUS) IMPLANT
DRAPE EXTREMITY T 121X128X90 (DRAPE) ×2 IMPLANT
DRAPE SURG 17X23 STRL (DRAPES) ×2 IMPLANT
DRAPE U-SHAPE 47X51 STRL (DRAPES) ×2 IMPLANT
DRSG EMULSION OIL 3X3 NADH (GAUZE/BANDAGES/DRESSINGS) ×2 IMPLANT
DRSG PAD ABDOMINAL 8X10 ST (GAUZE/BANDAGES/DRESSINGS) ×2 IMPLANT
DURAPREP 26ML APPLICATOR (WOUND CARE) ×2 IMPLANT
ELECT NEEDLE TIP 2.8 STRL (NEEDLE) ×2 IMPLANT
ELECT REM PT RETURN 9FT ADLT (ELECTROSURGICAL) ×2
ELECTRODE REM PT RTRN 9FT ADLT (ELECTROSURGICAL) ×1 IMPLANT
GAUZE SPONGE 4X4 12PLY STRL (GAUZE/BANDAGES/DRESSINGS) ×2 IMPLANT
GLOVE BIO SURGEON STRL SZ 6.5 (GLOVE) ×2 IMPLANT
GLOVE BIO SURGEON STRL SZ8 (GLOVE) ×6 IMPLANT
GLOVE BIOGEL PI IND STRL 7.0 (GLOVE) ×1 IMPLANT
GLOVE BIOGEL PI IND STRL 8 (GLOVE) ×3 IMPLANT
GLOVE BIOGEL PI INDICATOR 7.0 (GLOVE) ×1
GLOVE BIOGEL PI INDICATOR 8 (GLOVE) ×3
GOWN STRL REUS W/ TWL LRG LVL3 (GOWN DISPOSABLE) ×1 IMPLANT
GOWN STRL REUS W/ TWL XL LVL3 (GOWN DISPOSABLE) ×3 IMPLANT
GOWN STRL REUS W/TWL LRG LVL3 (GOWN DISPOSABLE) ×1
GOWN STRL REUS W/TWL XL LVL3 (GOWN DISPOSABLE) ×3
LOOP VESSEL MAXI BLUE (MISCELLANEOUS) IMPLANT
NEEDLE HYPO 25X1 1.5 SAFETY (NEEDLE) ×2 IMPLANT
NS IRRIG 1000ML POUR BTL (IV SOLUTION) ×2 IMPLANT
PACK BASIN DAY SURGERY FS (CUSTOM PROCEDURE TRAY) ×2 IMPLANT
PADDING CAST ABS 3INX4YD NS (CAST SUPPLIES) ×1
PADDING CAST ABS 4INX4YD NS (CAST SUPPLIES) ×1
PADDING CAST ABS COTTON 3X4 (CAST SUPPLIES) ×1 IMPLANT
PADDING CAST ABS COTTON 4X4 ST (CAST SUPPLIES) ×1 IMPLANT
PENCIL BUTTON HOLSTER BLD 10FT (ELECTRODE) ×2 IMPLANT
SHEET MEDIUM DRAPE 40X70 STRL (DRAPES) IMPLANT
SPLINT PLASTER CAST XFAST 3X15 (CAST SUPPLIES) ×5 IMPLANT
SPLINT PLASTER XTRA FASTSET 3X (CAST SUPPLIES) ×5
STOCKINETTE 4X48 STRL (DRAPES) IMPLANT
STOCKINETTE 6  STRL (DRAPES) ×1
STOCKINETTE 6 STRL (DRAPES) ×1 IMPLANT
STRIP CLOSURE SKIN 1/4X4 (GAUZE/BANDAGES/DRESSINGS) IMPLANT
SUT ETHILON 4 0 PS 2 18 (SUTURE) ×2 IMPLANT
SUT ETHILON 5 0 P 3 18 (SUTURE)
SUT ETHILON 5 0 PS 2 18 (SUTURE) IMPLANT
SUT NYLON ETHILON 5-0 P-3 1X18 (SUTURE) IMPLANT
SUT VIC AB 4-0 P-3 18XBRD (SUTURE) IMPLANT
SUT VIC AB 4-0 P3 18 (SUTURE)
SYR BULB 3OZ (MISCELLANEOUS) ×2 IMPLANT
SYR CONTROL 10ML LL (SYRINGE) ×2 IMPLANT
TOWEL OR 17X24 6PK STRL BLUE (TOWEL DISPOSABLE) ×4 IMPLANT
TOWEL OR NON WOVEN STRL DISP B (DISPOSABLE) IMPLANT
UNDERPAD 30X30 INCONTINENT (UNDERPADS AND DIAPERS) ×2 IMPLANT

## 2013-11-24 NOTE — Discharge Instructions (Signed)

## 2013-11-24 NOTE — Interval H&P Note (Signed)
History and Physical Interval Note:  11/24/2013 11:44 AM  Cheyenne Guzman  has presented today for surgery, with the diagnosis of left carpal tunnel release with ganglion cyst  The various methods of treatment have been discussed with the patient and family. After consideration of risks, benefits and other options for treatment, the patient has consented to  Procedure(s): LEFT CARPAL TUNNEL RELEASE WITH CYST EXCISION  (Left) CYST REMOVAL (Left) as a surgical intervention .  The patient's history has been reviewed, patient examined, no change in status, stable for surgery.  I have reviewed the patient's chart and labs.  Questions were answered to the patient's satisfaction.     Jauna Raczynski G

## 2013-11-24 NOTE — Anesthesia Procedure Notes (Addendum)
Anesthesia Regional Block:  Bier block (IV Regional)  Pre-Anesthetic Checklist: ,, timeout performed, Correct Patient, Correct Site, Correct Laterality, Correct Procedure, Correct Position, site marked, Risks and benefits discussed,  Surgical consent,  Pre-op evaluation,  At surgeon's request and post-op pain management  Laterality: Left  Prep: alcohol swabs       Needles:  Injection technique: Single-shot      Additional Needles: Bier block (IV Regional) Narrative:  Resident/CRNA: Jacklynn Barnacle CRNA  Additional Notes: Double tourniquet on Left forearm placed.  Forearm exsanguinated with elastic wrap.  Tourniquet inflated to 250 mm Hg.  Lidocaine 0.5% infused 38 ml without complaint of ears ringing, periorbital numbness, or metallic taste.  Jacklynn Barnacle, CRNA   Performed by: Marrianne Mood    Procedure Name: MAC Date/Time: 11/24/2013 1:12 PM Performed by: Marrianne Mood Pre-anesthesia Checklist: Patient identified, Timeout performed, Emergency Drugs available, Suction available and Patient being monitored Patient Re-evaluated:Patient Re-evaluated prior to inductionOxygen Delivery Method: Simple face mask

## 2013-11-24 NOTE — Anesthesia Preprocedure Evaluation (Signed)
Anesthesia Evaluation  Patient identified by MRN, date of birth, ID band Patient awake    Reviewed: Allergy & Precautions, H&P , NPO status , Patient's Chart, lab work & pertinent test results  Airway Mallampati: II   Neck ROM: full    Dental   Pulmonary shortness of breath, asthma , sleep apnea , former smoker,          Cardiovascular negative cardio ROS      Neuro/Psych  Headaches, Anxiety Depression TIA Neuromuscular disease CVA    GI/Hepatic   Endo/Other  diabetes, Type 2Morbid obesity  Renal/GU      Musculoskeletal  (+) Arthritis -,   Abdominal   Peds  Hematology   Anesthesia Other Findings   Reproductive/Obstetrics                             Anesthesia Physical Anesthesia Plan  ASA: III  Anesthesia Plan: MAC and Bier Block   Post-op Pain Management:    Induction: Intravenous  Airway Management Planned: Simple Face Mask  Additional Equipment:   Intra-op Plan:   Post-operative Plan:   Informed Consent: I have reviewed the patients History and Physical, chart, labs and discussed the procedure including the risks, benefits and alternatives for the proposed anesthesia with the patient or authorized representative who has indicated his/her understanding and acceptance.     Plan Discussed with: CRNA, Anesthesiologist and Surgeon  Anesthesia Plan Comments:         Anesthesia Quick Evaluation

## 2013-11-24 NOTE — Anesthesia Postprocedure Evaluation (Signed)
Anesthesia Post Note  Patient: Cheyenne Guzman  Procedure(s) Performed: Procedure(s) (LRB): LEFT CARPAL TUNNEL RELEASE WITH CYST EXCISION  (Left) CYST REMOVAL (Left)  Anesthesia type: MAC  Patient location: PACU  Post pain: Pain level controlled and Adequate analgesia  Post assessment: Post-op Vital signs reviewed, Patient's Cardiovascular Status Stable and Respiratory Function Stable  Last Vitals:  Filed Vitals:   11/24/13 1430  BP: 124/77  Pulse: 71  Temp:   Resp: 15    Post vital signs: Reviewed and stable  Level of consciousness: awake, alert  and oriented  Complications: No apparent anesthesia complications

## 2013-11-24 NOTE — Op Note (Signed)
PRE-OP DIAGNOSIS:  left carpal tunnel syndrome and cyst POST-OP DIAGNOSIS:  same PROCEDURE:  left carpal tunnel release and cyst excision ANESTHESIA:  Bier block and MAC ATTENDING SURGEON:  Melrose Nakayama MD ASSISTANT:  Loni Dolly PA  INDICATION FOR PROCEDURE:  Cheyenne Guzman is a 48 y.o. female with a long history of hand Guzman and numbness as well as a cyst.  The patient has failed conservative measures of treatment and persists with symptoms which are very limiting.  The patient is offered carpal tunnel release in hopes of improving their situation by relieving pressure on the medial nerve.  She was offered cyst excision at the same sitting. Informed operative consent was obtained after discussion of possible complications including reaction to anesthesia, infection, and neurovascular injury.  SUMMARY OF FINDINGS AND PROCEDURE:  Under to above anesthetic a carpal tunnel release was performed as well as a cyst excision.  The patient had a severely thickened transverse carpal tunnel ligament which was applying compression to the underlying medial nerve.  This was release under direct visualization.  We also removed a small fatty mass from the volar aspect of the wrist.  The skin was closed primarily followed by placement of a sterile dressing and splint with the wrist in slight extension.  DESCRIPTION OF PROCEDURE:  Cheyenne Guzman was taken to an operative suite where the above anesthetic was applied.  The patient was then prepped and draped in normal sterile fashion.  An appropriate time out was performed.  After the administration of Kefzol pre-operative antibiotic and application and inflation of a tourniquet a small incision was made on the palmar aspect of the palm.  This did not cross the wrist flexion crease and was ulnar to the thenar flexion crease.  Dissection was carried down through palmar fascia to expose the transverse carpal ligament which was released under direct visualization.  The  dissection was taken distally towards the transverse arch of vessels and distally through the distal fascia of the forearm.  The wound was irrigated and the skin was closed with nylon.  We also made a one cm incision slightly more proximal on the volar aspect of the forearm and dissected down to a small fatty mass which was 5-6 mm in size.  This was removed and the wound closed with nylon.  The tourniquet was deflated and skin edges became pink immediately and pulses were intact.  Adaptic was applied along with a sterile dressing and a plaster splint with the wrist in slight extension.  Estimated blood loss and intraoperative fluids can be obtained from anesthesia records as can tourniquet time.  DISPOSITION:  Cheyenne Guzman was taken to recovery room in stable condition.  Plans were to go home same day and I will contact the patient by phone tonight.  The patient will follow-up in about a week in the office.  Cheyenne Guzman 11/24/2013, 1:46 PM

## 2013-11-24 NOTE — Transfer of Care (Signed)
Immediate Anesthesia Transfer of Care Note  Patient: Cheyenne Guzman  Procedure(s) Performed: Procedure(s): LEFT CARPAL TUNNEL RELEASE WITH CYST EXCISION  (Left) CYST REMOVAL (Left)  Patient Location: PACU  Anesthesia Type:MAC and Bier block  Level of Consciousness: awake, alert , oriented and patient cooperative  Airway & Oxygen Therapy: Patient Spontanous Breathing and Patient connected to face mask oxygen  Post-op Assessment: Report given to PACU RN and Post -op Vital signs reviewed and stable  Post vital signs: Reviewed and stable  Complications: No apparent anesthesia complications

## 2013-11-26 ENCOUNTER — Encounter (HOSPITAL_BASED_OUTPATIENT_CLINIC_OR_DEPARTMENT_OTHER): Payer: Self-pay | Admitting: Orthopaedic Surgery

## 2013-12-11 NOTE — Progress Notes (Signed)
Quick Note:  I reviewed the patient's CPAP compliance data from 10/11/2013 to 11/09/2013, which is a total of 30 days, during which time the patient used CPAP every day except for 4 days. The average usage for all days was 5 hours and 24 minutes. The percent used days greater than 4 hours was 77 %, indicating good compliance. The residual AHI was 1.1 per hour, indicating an adequate treatment pressure of 7 cwp with EPR of 2. Air leak from the mask was low. I will review this data with the patient at the next office visit, which is currently routinely scheduled for 12/15/2013 at 1 PM, provide feedback and additional troubleshooting if need be.  Star Age, MD, PhD [facility] ([facility])   ______

## 2013-12-15 ENCOUNTER — Ambulatory Visit: Payer: BC Managed Care – PPO | Admitting: Neurology

## 2014-01-07 ENCOUNTER — Ambulatory Visit (INDEPENDENT_AMBULATORY_CARE_PROVIDER_SITE_OTHER): Payer: BC Managed Care – PPO | Admitting: Nurse Practitioner

## 2014-01-07 ENCOUNTER — Encounter: Payer: Self-pay | Admitting: Nurse Practitioner

## 2014-01-07 VITALS — BP 152/82 | HR 82 | Ht 67.0 in | Wt 392.6 lb

## 2014-01-07 DIAGNOSIS — E0842 Diabetes mellitus due to underlying condition with diabetic polyneuropathy: Secondary | ICD-10-CM

## 2014-01-07 DIAGNOSIS — R519 Headache, unspecified: Secondary | ICD-10-CM

## 2014-01-07 DIAGNOSIS — R51 Headache: Secondary | ICD-10-CM

## 2014-01-07 DIAGNOSIS — G8929 Other chronic pain: Secondary | ICD-10-CM

## 2014-01-07 DIAGNOSIS — E1140 Type 2 diabetes mellitus with diabetic neuropathy, unspecified: Secondary | ICD-10-CM | POA: Insufficient documentation

## 2014-01-07 NOTE — Progress Notes (Signed)
[facility]  PATIENT: Cheyenne Guzman DOB: 1965/03/04   REASON FOR VISIT: Follow-up for migraine, peripheral neuropathy   HISTORY OF PRESENT ILLNESS: Cheyenne Guzman is a 79 right-handed Caucasian female, referred by ENT Dr. Constance Holster, and her primary care physician Dr. Sarajane Jews for evaluation of headaches with associated dizziness, nausea, neck pain, pressure behind eyes, anxiety, blurred vision. She had a past medical history of obesity, occasionally migraine the past, hyperlipidemia, depression, reported excessive stress recently, a lot of family member is sick. She reported increased frequency of migraines since April, 3 weeks history constant daily headaches, left retro-orbital area, left parietal, occipital region, pounding headaches, with associated light sensitivity, nauseous, movements make it worse, dizziness, lasting couple hours, multiple episode in a day, She also complains of dizziness, spinning sensation, decreased hearing, She was evaluated by Dr. Constance Holster in Jun 19 2013, they will was bilateral symmetric high frequency sensory neural no hearing loss, otherwise normal exam, She is to take off work as a constant past couple weeks because of her constant headaches, she also complains of blurry vision,  UPDATE July 10th 2015:Per patient, she was evaluated by her ophthalmologist Dr. Sherral Hammers, with evidence of mild diabetic retinopathy, but otherwise normal, MRI of the brain was normal.Lab showed elevated CRP 26, ESR 35, Normal TSH, B12. She reported moderate improvement 50% with Topamax, but complains of side effect from medications, includes urinary urgency, fatigue, blurry vision, slurred speech. She also complains of worsening bilateral fingers, and feet paresthesia, could hardly feel the keyboard, recent 3 weeks, she also developed right anterior leg shooting pain, to anterior shin, after prolonged sitting, relieved by standing up, she also complains of worsening depression,  crying during today's clinical visit She still has daily left retro-orbital area headaches, pounding, pressure, lasting 30 minutes to one hour, multiple episodes a day, 3-4 times each day, she is very hesitate to take any pain relief medications, worry about the long-term side effect,She has symptoms of obstructive sleep apnea, daytime fatigue, frequent snoring, catching breath in her sleep UPDATE Sep 3rd 2015: She still has headaches daily, never tried Maxalt, worry about the side effect, she does have vascular risk factor of obesity, hypertension, diabetes, previous TIA like symptoms. Recent sleep study consistent with obstructive sleep apnea, she has been taking Fioricet as needed for migraine, which has been very helpful, she knows to limit the use to 2-3 times each weekShe continued to complains of constant left hand paresthesia, bilateral feet pain, difficulty bearing weight, worsening right groin area shooting pain, triggered by prolonged sitting, standing, walking, she will be evaluated by orthopedic surgeon Dr. Patrice Paradise In September 17  UPDATE 01/07/14 Cheyenne Guzman, 48 year old female returns for follow-up. She was last seen in this office by Dr. Krista Blue 09/24/2013. She is continuing to have migraines however since being on CPAP for her obstructive sleep apnea she no longer has morning headaches. Her headaches are in the left parietal occipital region can be pounding associated with light sensitivity nausea and movement makes it worse. She has several of these a week. She is currently on Topamax extended release and Oxtellar  for peripheral neuropathy paresthesias. Stress is a trigger for her headaches however she is not aware of other triggers, she has never kept a headache diary. She had recent left and right carpal tunnel release. She was also recently diagnosed with spinal stenosis. She is morbidly obese, hypertensive, and diabetic however she claims her blood sugars are in good control. She returns for  reevaluation  REVIEW OF SYSTEMS: Full 14 system review of systems performed and notable only for those listed, all others are neg:  Constitutional: N/A  Cardiovascular: N/A  Ear/Nose/Throat: Hearing loss Skin: N/A  Eyes: Eye pain during the headache  Respiratory: N/A  Gastroitestinal: Urinary incontinence  Hematology/Lymphatic: N/A  Endocrine: N/A Musculoskeletal:N/A  Allergy/Immunology: N/A  Neurological: Numbness in the toes and fingers, headache Psychiatric: Depression and anxiety Sleep : Obstructive sleep apnea with CPAP   ALLERGIES: Not on File  HOME MEDICATIONS: Outpatient Prescriptions Prior to Visit  Medication Sig Dispense Refill  . albuterol (PROVENTIL HFA;VENTOLIN HFA) 108 (90 BASE) MCG/ACT inhaler Inhale 2 puffs into the lungs every 4 (four) hours as needed for wheezing or shortness of breath.    Marland Kitchen albuterol (PROVENTIL) (2.5 MG/3ML) 0.083% nebulizer solution 1 VIAL IN NEBULIZER EVERY 4 HOURS AS NEEDED FOR WHEEZING 75 mL 1  . ALPRAZolam (XANAX) 1 MG tablet Take 1 tablet (1 mg total) by mouth 3 (three) times daily as needed for anxiety. 90 tablet 2  . aspirin 325 MG EC tablet Take 325 mg by mouth at bedtime.     . butalbital-acetaminophen-caffeine (FIORICET) 50-325-40 MG per tablet Take 1-2 tablets by mouth every 6 (six) hours as needed for headache. 30 tablet 3  . furosemide (LASIX) 40 MG tablet Take 1 tablet (40 mg total) by mouth 2 (two) times daily as needed. As needed for swelling. 60 tablet 11  . insulin lispro (HUMALOG) 100 UNIT/ML injection Inject 75 Units into the skin 3 (three) times daily with meals.     . insulin regular (NOVOLIN R,HUMULIN R) 100 units/mL injection Inject 75 Units into the skin 3 (three) times daily before meals. Humulin    . metFORMIN (GLUCOPHAGE) 500 MG tablet Take 1,000 mg by mouth daily after supper.     . Multiple Vitamin (MULTIVITAMIN WITH MINERALS) TABS Take 1 tablet by mouth at bedtime.    . OXcarbazepine ER (OXTELLAR XR) 150 MG TB24  Take 150 mg by mouth at bedtime. 30 tablet 11  . sertraline (ZOLOFT) 100 MG tablet Take 2 tablets (200 mg total) by mouth daily. 180 tablet 3  . simvastatin (ZOCOR) 40 MG tablet TAKE 1 TABLET EVERY EVENING 90 tablet 0  . Topiramate ER 100 MG CP24 Take 200 mg by mouth at bedtime. 180 capsule 3  . Diclofenac Potassium (CAMBIA) 50 MG PACK Take 50 mg by mouth as needed. 30 each 11  . oxyCODONE-acetaminophen (ROXICET) 5-325 MG per tablet Take 1-2 tablets by mouth every 6 (six) hours as needed for severe pain. 40 tablet 0   No facility-administered medications prior to visit.    PAST MEDICAL HISTORY: Past Medical History  Diagnosis Date  . Depression   . Hyperlipidemia   . TIA (transient ischemic attack)     in 2007,2008, and 2011  . Urosepsis 08-2009    with Klebsiella  . Breast mass in female   . Asthma     seasonal   . Diabetes mellitus     sees Dr. Chalmers Cater, diagnosed 2003  . Kidney infection     - hosp. [facility]- septic- 10/2009  . Cancer     breast, sees Dr. Jana Hakim , Left - 11/2007  . Arthritis     back & ankles   . Anxiety     per pt. - mild   . Peripheral neuropathy     R sided numbness- face & jaw  . HA (headache)   . Complication of anesthesia 2013  severe muscle spasms-sore-no doc  . Stroke     tia-no deficit  . Sleep apnea 9/15    severe-just started cpap-doing well    PAST SURGICAL HISTORY: Past Surgical History  Procedure Laterality Date  . Foot surgery  2000    plantar fasciitis-left  . Nerve graft  06-01-08    cadaver graft to right inferior alveolar nerve  in [place] -[place]  . Abdominal hysterectomy  08/2004  . Breast surgery  11/2007    left ductal carcinoma in situ  . Breast biopsy  06/22/2011    Procedure: BREAST BIOPSY;  Surgeon: Stark Klein, MD;  Location: [place];  Service: General;  Laterality: Right;  . Carpal tunnel release Right 10/13/2013    Procedure: RIGHT CARPAL TUNNEL RELEASE;  Surgeon: Hessie Dibble, MD;  Location: [place];   Service: Orthopedics;  Laterality: Right;  . Carpal tunnel release Left 11/24/2013    Procedure: LEFT CARPAL TUNNEL RELEASE WITH CYST EXCISION ;  Surgeon: Hessie Dibble, MD;  Location: [place];  Service: Orthopedics;  Laterality: Left;  . Ear cyst excision Left 11/24/2013    Procedure: CYST REMOVAL;  Surgeon: Hessie Dibble, MD;  Location: [place];  Service: Orthopedics;  Laterality: Left;    FAMILY HISTORY: Family History  Problem Relation Age of Onset  . Cancer Mother     breast  . Cancer Father     lung  . Cancer Sister     colon  . Cancer Maternal Aunt     breast - both  . Anesthesia problems Neg Hx     SOCIAL HISTORY: History   Social History  . Marital Status: Married    Spouse Name: [name]    Number of Children: 2  . Years of Education: 14   Occupational History  .      [employer]   Social History Main Topics  . Smoking status: Former Smoker    Quit date: 02/18/1997  . Smokeless tobacco: Never Used  . Alcohol Use: Yes     Comment: rare  . Drug Use: No  . Sexual Activity: Not on file   Other Topics Concern  . Not on file   Social History Narrative   Patient lives at home with her husband [name]) and two daughters.    Patient works for [employer] full time patient is on medical leave at this time.   Education two years of college.   Right handed.   Caffeine two times per day.     PHYSICAL EXAM  Filed Vitals:   01/07/14 1524  BP: 152/82  Pulse: 82  Height: _0  (1.702 m)  Weight: 392 lb 9.6 oz (178.082 kg)   Body mass index is 61.48 kg/(m^2). Generalized: In no acute distress, morbidly obese female Neck: Supple, no carotid bruits  Cardiac: Regular rate rhythm Pulmonary: Clear to auscultation bilaterally Musculoskeletal: No deformity  Neurological examination Mentation: Obesity, Alert oriented to time, place, history taking, and causual conversation, crying during interview   Cranial nerve  II-XII: Pupils were equal round reactive to light. Extraocular movements were full. Visual field were full on confrontational test. Bilateral fundi edge was mildly blurred. Facial sensation and strength were normal. Hearing was intact to finger rubbing bilaterally. Uvula tongue midline. Head turning and shoulder shrug and were normal and symmetric.Tongue protrusion into cheek strength was normal. Motor: Normal tone, bulk and strength. No focal weakness Sensory: length dependent decreased fine touch, pinprick to  above ankle level, absent vibratory sensation at toes. Coordination: Normal finger to nose, heel-to-shin bilaterally there was no truncal ataxia Gait: Rising up from seated position without assistance, steady, big body habitus unable to tandem Romberg signs: Negative Deep tendon reflexes: Brachioradialis 2/2, biceps 2/2, triceps 2/2, patellar 2/2, Achilles trace, plantar responses were flexor bilaterally.   DIAGNOSTIC DATA (LABS, IMAGING, TESTING) - I reviewed patient records, labs, notes, testing and imaging myself where available.  Lab Results  Component Value Date   WBC 7.2 11/27/2012   HGB 15.3* 11/24/2013   HCT 45.0 11/24/2013   MCV 80.6 11/27/2012   PLT 199 11/27/2012      Component Value Date/Time   NA 142 11/24/2013 1231   NA 142 06/25/2013 1037   K 4.2 11/24/2013 1231   CL 109 11/24/2013 1231   CO2 23 10/12/2013 1230   GLUCOSE 180* 11/24/2013 1231   GLUCOSE 173* 06/25/2013 1037   BUN 14 11/24/2013 1231   BUN 14 06/25/2013 1037   CREATININE 0.90 11/24/2013 1231   CREATININE 0.93 02/01/2012 1317   CALCIUM 9.2 10/12/2013 1230   PROT 7.3 06/25/2013 1037   PROT 7.2 02/01/2012 1317   ALBUMIN 4.4 02/01/2012 1317   AST 21 06/25/2013 1037   ALT 15 06/25/2013 1037   ALKPHOS 85 06/25/2013 1037   BILITOT 0.4 06/25/2013 1037   GFRNONAA 72* 10/12/2013 1230   GFRAA 83* 10/12/2013 1230   Lab Results  Component Value Date   CHOL 148 11/28/2012   HDL 31* 11/28/2012     LDLCALC 78 11/28/2012   LDLDIRECT 189.7 11/27/2006   TRIG 193* 11/28/2012   CHOLHDL 4.8 11/28/2012   Lab Results  Component Value Date   HGBA1C 7.7* 11/28/2012   Lab Results  Component Value Date   VITAMINB12 673 06/25/2013   Lab Results  Component Value Date   TSH 4.280 06/25/2013     ASSESSMENT AND PLAN  48 y.o. year old female  has a past medical history of Depression; Hyperlipidemia; TIA (transient ischemic attack); Urosepsis (08-2009);  Diabetes mellitus;  Arthritis; Anxiety; Peripheral neuropathy; HA (headache); Stroke; and Sleep apnea (9/15). here to follow-up. She is continuing to have headaches however her morning headaches are much better since using CPAP.  Continue Oxtellar at bedtime Continue Topamax 200 mg daily Add B6 vitamins Given information on migraine triggers, common  foods etc.Given infromation on stress management.  Keep a record of your headaches and return to the clinic in 2 months Vst time face to face was 1 hour answering multiple questions, giving written information and reviewing with her, etc Dennie Bible, [facility], [facility], [facility] [address], [place], [place] [address] [phone]

## 2014-01-07 NOTE — Patient Instructions (Addendum)
Continue Oxtellar at bedtime Continue Topamax 200 mg daily Add B6  vitamins Given information on migraine triggers, common  foods etc. Keep a record of your headaches and return to the clinic in 2 months

## 2014-01-29 NOTE — Progress Notes (Signed)
I agree above plan. 

## 2014-02-06 ENCOUNTER — Encounter: Payer: Self-pay | Admitting: Neurology

## 2014-02-15 ENCOUNTER — Encounter: Payer: Self-pay | Admitting: Neurology

## 2014-02-16 ENCOUNTER — Ambulatory Visit (INDEPENDENT_AMBULATORY_CARE_PROVIDER_SITE_OTHER): Payer: BLUE CROSS/BLUE SHIELD | Admitting: Neurology

## 2014-02-16 ENCOUNTER — Encounter: Payer: Self-pay | Admitting: Neurology

## 2014-02-16 VITALS — BP 119/79 | HR 81 | Temp 98.9°F | Resp 15 | Ht 67.0 in | Wt >= 6400 oz

## 2014-02-16 DIAGNOSIS — R51 Headache: Secondary | ICD-10-CM

## 2014-02-16 DIAGNOSIS — R519 Headache, unspecified: Secondary | ICD-10-CM

## 2014-02-16 DIAGNOSIS — G4733 Obstructive sleep apnea (adult) (pediatric): Secondary | ICD-10-CM

## 2014-02-16 DIAGNOSIS — Z9989 Dependence on other enabling machines and devices: Secondary | ICD-10-CM

## 2014-02-16 DIAGNOSIS — E0842 Diabetes mellitus due to underlying condition with diabetic polyneuropathy: Secondary | ICD-10-CM

## 2014-02-16 NOTE — Patient Instructions (Signed)
Please continue using your CPAP regularly. While your insurance requires that you use CPAP at least 4 hours each night on 70% of the nights, I recommend, that you not skip any nights and use it throughout the night if you can. Getting used to CPAP and staying with the treatment long term does take time and patience and discipline. Untreated obstructive sleep apnea when it is moderate to severe can have an adverse impact on cardiovascular health and raise her risk for heart disease, arrhythmias, hypertension, congestive heart failure, stroke and diabetes. Untreated obstructive sleep apnea causes sleep disruption, nonrestorative sleep, and sleep deprivation. This can have an impact on your day to day functioning and cause daytime sleepiness and impairment of cognitive function, memory loss, mood disturbance, and problems focussing. Using CPAP regularly can improve these symptoms.  Please remember to try to maintain good sleep hygiene, which means: Keep a regular sleep and wake schedule, try not to exercise or have a meal within 2 hours of your bedtime, try to keep your bedroom conducive for sleep, that is, cool and dark, without light distractors such as an illuminated alarm clock, and refrain from watching TV right before sleep or in the middle of the night and do not keep the TV or radio on during the night. Also, try not to use or play on electronic devices at bedtime, such as your cell phone, tablet PC or laptop. If you like to read at bedtime on an electronic device, try to dim the background light as much as possible. Do not eat in the middle of the night.

## 2014-02-16 NOTE — Progress Notes (Signed)
Subjective:    Patient ID: Cheyenne Guzman is a 49 y.o. female.  HPI     Star Age, MD, PhD [facility] [address] [place], [place] [address]  Dear Cheyenne Guzman and Cheyenne Guzman,   I saw your patient, Cheyenne Guzman, upon your kind request in my clinic today, for initial consultation of her sleep disorder, in particular her recent diagnosis of OSA. The patient is accompanied by Alissa, 19 yo daughter, today. Of note, she had to cancel a previous appointment with me on 12/15/2013 due to her sister's illness. As you know, Cheyenne Guzman is a 49 year-old right-handed woman with an underlying medical history of morbid obesity, recurrent migraine headaches, diabetes with diabetic neuropathy, low back pain, asthma and hyperlipidemia, who you had referred her for a sleep study due to a report of snoring, gasping while asleep, witnessed apneas, and daytime somnolence. She had a split night sleep study on 09/18/2013 and went over her test results with her in detail today. Baseline sleep efficiency was 66.1% with a latency to sleep of 46.5 minutes and wake after sleep onset of 18 minutes with mild sleep fragmentation noted. She had an increased percentage of stage II sleep, a reduced percentage of slow-wave sleep and absence of REM sleep. Moderate and loud snoring was noted. She slept only on her back. She reported sleeping in her recliner at home. She had a total AHI of 45.4 per hour. Baseline oxygen saturation was 94%, nadir was 87%. She was placed on CPAP therapy. She had an increased percentage of REM sleep at 27.2%. Average oxygen saturation was 93%, nadir was 86%. She had no significant PLM's or EKG changes. CPAP was titrated from 5-8 cm. AHI was 2.1 per hour at 7 cm of pressure with supine REM sleep achieved. Based on the test results I placed on CPAP therapy and ordered CPAP for home use through [facility] as the DME.  I reviewed the patient's CPAP compliance data from 10/11/2013  to 11/09/2013, which is a total of 30 days, during which time the patient used CPAP every day except for 4 days. The average usage for all days was 5 hours and 24 minutes. The percent used days greater than 4 hours was 77 %, indicating good compliance. The residual AHI was 1.1 per hour, indicating an adequate treatment pressure of 7 cwp with EPR of 2. Air leak from the mask was low.  Today, I reviewed her compliance data from 01/16/2014 through 02/14/2014 which is a total of 30 days during which time he/she used her machine only 9 days with percent used days greater than 4 hours at 30%, indicating poor compliance, average usage of 1 hour and 54 minutes for all days, average usage for days on machine 6 hours and 20 minutes, pressure at 7 cm with EPR of 2, residual AHI low at 1.2 per hour and leak low at 1.7 L/m for the 95th percentile.  Today, she reports not adhering to a set sleep wake schedule for various reasons: she has been taking care of her mother with AD and her husband is disabled. She has picked up painting and paints into the night and she does not sleep. She has a lot of stress. She has a 50 yo. She saw a therapist during the summer and fall. She quit smoking about 20 years ago. She drinks tea during the day. She has been on Topamax and has diabetic neuropathy. She is in PT for her back  and she has seen Dr. Patrice Paradise for spinal stenosis.  Her ESS is 8/24 today. She does admit that when she first started using CPAP she noticed a difference in her sleep and that she slept better and she did not wake up with headaches first thing in the morning which was a big improvement.  The patient has not noted any RLS symptoms and is not known to kick while asleep or before falling asleep. Her sister is in the process of having a sleep study.     She denies cataplexy, sleep paralysis, hypnagogic or hypnopompic hallucinations, or sleep attacks. She does not report any vivid dreams, nightmares, dream enactments, or  parasomnias, such as sleep talking or sleep walking.   Her Past Medical History Is Significant For: Past Medical History  Diagnosis Date  . Depression   . Hyperlipidemia   . TIA (transient ischemic attack)     in 2007,2008, and 2011  . Urosepsis 08-2009    with Klebsiella  . Breast mass in female   . Asthma     seasonal   . Diabetes mellitus     sees Dr. Chalmers Cater, diagnosed 2003  . Kidney infection     - hosp. [facility]- septic- 10/2009  . Cancer     breast, sees Dr. Jana Hakim , Left - 11/2007  . Arthritis     back & ankles   . Anxiety     per pt. - mild   . Peripheral neuropathy     R sided numbness- face & jaw  . HA (headache)   . Complication of anesthesia 2013    severe muscle spasms-sore-no doc  . Stroke     tia-no deficit  . Sleep apnea 9/15    severe-just started cpap-doing well  . Low back pain   . Hearing loss     Went to [facility],Dr [facility]  . Tinnitus of right ear     Her Past Surgical History Is Significant For: Past Surgical History  Procedure Laterality Date  . Foot surgery  2000    plantar fasciitis-left  . Nerve graft  06-01-08    cadaver graft to right inferior alveolar nerve  in [place]  . Abdominal hysterectomy  08/2004  . Breast surgery  11/2007    left ductal carcinoma in situ  . Breast biopsy  06/22/2011    Procedure: BREAST BIOPSY;  Surgeon: Stark Klein, MD;  Location: [place];  Service: General;  Laterality: Right;  . Carpal tunnel release Right 10/13/2013    Procedure: RIGHT CARPAL TUNNEL RELEASE;  Surgeon: Hessie Dibble, MD;  Location: [place];  Service: Orthopedics;  Laterality: Right;  . Carpal tunnel release Left 11/24/2013    Procedure: LEFT CARPAL TUNNEL RELEASE WITH CYST EXCISION ;  Surgeon: Hessie Dibble, MD;  Location: [place];  Service: Orthopedics;  Laterality: Left;  . Ear cyst excision Left 11/24/2013    Procedure: CYST REMOVAL;  Surgeon: Hessie Dibble, MD;   Location: [place];  Service: Orthopedics;  Laterality: Left;    Her Family History Is Significant For: Family History  Problem Relation Age of Onset  . Cancer Mother     breast  . Cancer Father     lung  . Cancer Sister     colon  . Cancer Maternal Aunt     breast - both  . Anesthesia problems Neg Hx     Her Social History Is Significant  For: History   Social History  . Marital Status: Married    Spouse Name: [name]    Number of Children: 2  . Years of Education: 14   Occupational History  .      [employer] [place]   Social History Main Topics  . Smoking status: Former Smoker    Quit date: 02/18/1997  . Smokeless tobacco: Never Used  . Alcohol Use: Yes     Comment: rare  . Drug Use: No  . Sexual Activity: None   Other Topics Concern  . None   Social History Narrative   Patient lives at home with her husband [name]) and two daughters.    Patient works for [employer] full time patient is on medical leave at this time.   Education two years of college.   Right handed.   Caffeine two times per day.    Her Allergies Are:  Not on File:   Her Current Medications Are:  Outpatient Encounter Prescriptions as of 02/16/2014  Medication Sig  . albuterol (PROVENTIL HFA;VENTOLIN HFA) 108 (90 BASE) MCG/ACT inhaler Inhale 2 puffs into the lungs every 4 (four) hours as needed for wheezing or shortness of breath.  Marland Kitchen albuterol (PROVENTIL) (2.5 MG/3ML) 0.083% nebulizer solution 1 VIAL IN NEBULIZER EVERY 4 HOURS AS NEEDED FOR WHEEZING  . ALPRAZolam (XANAX) 1 MG tablet Take 1 tablet (1 mg total) by mouth 3 (three) times daily as needed for anxiety.  Marland Kitchen aspirin 325 MG EC tablet Take 325 mg by mouth at bedtime.   . butalbital-acetaminophen-caffeine (FIORICET) 50-325-40 MG per tablet Take 1-2 tablets by mouth every 6 (six) hours as needed for headache.  . furosemide (LASIX) 40 MG tablet Take 1 tablet (40 mg total) by mouth 2 (two) times daily as needed. As needed  for swelling.  . insulin lispro (HUMALOG) 100 UNIT/ML injection Inject 75 Units into the skin 3 (three) times daily with meals.   . insulin regular (NOVOLIN R,HUMULIN R) 100 units/mL injection Inject 75 Units into the skin 3 (three) times daily before meals. Humulin  . metFORMIN (GLUCOPHAGE) 500 MG tablet Take 1,000 mg by mouth daily after supper.   . Multiple Vitamin (MULTIVITAMIN WITH MINERALS) TABS Take 1 tablet by mouth at bedtime.  . OXcarbazepine ER (OXTELLAR XR) 150 MG TB24 Take 150 mg by mouth at bedtime.  . sertraline (ZOLOFT) 100 MG tablet Take 2 tablets (200 mg total) by mouth daily.  . simvastatin (ZOCOR) 40 MG tablet TAKE 1 TABLET EVERY EVENING  . Topiramate ER 100 MG CP24 Take 200 mg by mouth at bedtime.  :  Review of Systems:  Out of a complete 14 point review of systems, all are reviewed and negative with the exception of these symptoms as listed below:  Review of Systems  All other systems reviewed and are negative.   Objective:  Neurologic Exam  Physical Exam Physical Examination:   Filed Vitals:   02/16/14 1300  BP: 119/79  Pulse: 81  Temp: 98.9 F (37.2 C)  Resp: 15    General Examination: The patient is a very pleasant 49 y.o. female in no acute distress. She appears well-developed and well-nourished and well groomed. She is obese.   HEENT: Normocephalic, atraumatic, pupils are equal, round and reactive to light and accommodation. Funduscopic exam is normal with sharp disc margins noted. Extraocular tracking is good without limitation to gaze excursion or nystagmus noted. Normal smooth pursuit is noted. Hearing is grossly intact. Tympanic membranes are clear bilaterally. Face  is symmetric with normal facial animation and normal facial sensation. Speech is clear with no dysarthria noted. There is no hypophonia. There is no lip, neck/head, jaw or voice tremor. Neck is supple with full range of passive and active motion. There are no carotid bruits on auscultation.  Oropharynx exam reveals: mild mouth dryness, adequate dental hygiene and moderate airway crowding, due to large tongue, narrow airway entry and thick soft palate. Mallampati is class III. Tongue protrudes centrally and palate elevates symmetrically. Tonsils are 1+ to 2+ in size. Neck size is 20 3/8 inches. She has a Absent overbite. Nasal inspection reveals no significant nasal mucosal bogginess or redness and no septal deviation.   Chest: Clear to auscultation without wheezing, rhonchi or crackles noted.  Heart: S1+S2+0, regular and normal without murmurs, rubs or gallops noted.   Abdomen: Soft, non-tender and non-distended with normal bowel sounds appreciated on auscultation.  Extremities: There is 1+ pitting edema in the distal lower extremities bilaterally. Pedal pulses are intact.  Skin: Warm and dry without trophic changes noted. There are no varicose veins.  Musculoskeletal: exam reveals no obvious joint deformities, tenderness or joint swelling or erythema.   Neurologically:  Mental status: The patient is awake, alert and oriented in all 4 spheres. Her immediate and remote memory, attention, language skills and fund of knowledge are appropriate. There is no evidence of aphasia, agnosia, apraxia or anomia. Speech is clear with normal prosody and enunciation. Thought process is linear. Mood is normal and affect is normal.  Cranial nerves II - XII are as described above under HEENT exam. In addition: shoulder shrug is normal with equal shoulder height noted. Motor exam: Normal bulk, strength and tone is noted. There is no drift, tremor or rebound. Romberg is negative. Reflexes are 2+ throughout. Babinski: Toes are flexor bilaterally. Fine motor skills and coordination: intact with normal finger taps, normal hand movements, normal rapid alternating patting, normal foot taps and normal foot agility.  Cerebellar testing: No dysmetria or intention tremor on finger to nose testing. Heel to shin is  unremarkable bilaterally. There is no truncal or gait ataxia.  Sensory exam: intact to light touch, pinprick, vibration, temperature sense in the upper and lower extremities.  Gait, station and balance: She stands easily. No veering to one side is noted. No leaning to one side is noted. Posture is age-appropriate and stance is narrow based. Gait shows normal stride length and normal pace. No problems turning are noted. She turns en bloc. Tandem walk is difficult d/t body habitus.               Assessment and Plan:   In summary, CHRYSTINA NAFF is a very pleasant 49 y.o.-year old female with an underlying medical history of morbid obesity, recurrent migraine headaches, diabetes with diabetic neuropathy, low back pain, asthma and hyperlipidemiaw, who presents her initial visit after her recent sleep study. Her split-night sleep study in August 2015 confirmed severe sleep apnea by number of events. While she was compliant in the fall when she first received the machine she has since then not been using it as often, admitting that she has not been sleeping well enough and has been sleeping very erratically. She endorses stressors which are also triggers for migraines. She endorses a very erratic sleep and wake schedule at this time. She does admit that CPAP helped her sleep better and virtually eliminated her morning headaches which was a big benefit. We talked about her sleep test results in detail today.  I also explained her compliance data to her in detail today. All of her questions were answered. I had a long chat with the patient and her daughter (who was very attentive, asking good questions!) about my findings and the diagnosis of OSA, its prognosis and treatment options. We talked about medical treatments, surgical interventions and non-pharmacological approaches. I explained in particular the risks and ramifications of untreated moderate to severe OSA, especially with respect to developing cardiovascular  disease down the Road, including congestive heart failure, difficult to treat hypertension, cardiac arrhythmias, or stroke. Even type 2 diabetes has, in part, been linked to untreated OSA. Symptoms of untreated OSA include daytime sleepiness, memory problems, mood irritability and mood disorder such as depression and anxiety, lack of energy, as well as recurrent headaches, especially morning headaches. We talked about trying to maintain a healthy lifestyle in general, as well as the importance of weight control. I encouraged the patient to eat healthy, exercise daily and keep well hydrated, to keep a scheduled bedtime and wake time routine, to not skip any meals and eat healthy snacks in between meals. I advised the patient not to drive when feeling sleepy.  I explained the importance of being compliant with PAP treatment, not only for insurance purposes but primarily to improve Her symptoms, and for the patient's long term health benefit, including to reduce Her cardiovascular risks. She is motivated to get back on track with CPAP compliance. I will see her back in about 6 months. She has an appointment with Cheyenne Guzman next month. I answered all her questions today and the patient was in agreement. Thank you very much for allowing me to participate in the care of this nice patient. If I can be of any further assistance to you please do not hesitate to talk to me.  Sincerely,   Star Age, MD, PhD

## 2014-02-19 ENCOUNTER — Other Ambulatory Visit: Payer: Self-pay | Admitting: Family Medicine

## 2014-03-03 ENCOUNTER — Ambulatory Visit: Payer: Self-pay | Admitting: Family Medicine

## 2014-03-05 ENCOUNTER — Ambulatory Visit (INDEPENDENT_AMBULATORY_CARE_PROVIDER_SITE_OTHER): Payer: BLUE CROSS/BLUE SHIELD | Admitting: Family Medicine

## 2014-03-05 ENCOUNTER — Encounter: Payer: Self-pay | Admitting: Family Medicine

## 2014-03-05 VITALS — HR 74 | Temp 98.4°F | Ht 67.0 in

## 2014-03-05 DIAGNOSIS — E119 Type 2 diabetes mellitus without complications: Secondary | ICD-10-CM

## 2014-03-05 DIAGNOSIS — Z23 Encounter for immunization: Secondary | ICD-10-CM

## 2014-03-05 DIAGNOSIS — E785 Hyperlipidemia, unspecified: Secondary | ICD-10-CM

## 2014-03-05 DIAGNOSIS — R8290 Unspecified abnormal findings in urine: Secondary | ICD-10-CM

## 2014-03-05 LAB — POCT URINALYSIS DIPSTICK
BILIRUBIN UA: NEGATIVE
Blood, UA: NEGATIVE
GLUCOSE UA: NEGATIVE
KETONES UA: NEGATIVE
LEUKOCYTES UA: NEGATIVE
Nitrite, UA: NEGATIVE
Protein, UA: NEGATIVE
SPEC GRAV UA: 1.02
Urobilinogen, UA: 4
pH, UA: 7

## 2014-03-05 MED ORDER — SIMVASTATIN 40 MG PO TABS: 40.0000 mg | ORAL_TABLET | Freq: Every evening | ORAL | Status: DC

## 2014-03-05 NOTE — Addendum Note (Signed)
Addended by: Aggie Hacker A on: 03/05/2014 02:41 PM   Modules accepted: Orders

## 2014-03-05 NOTE — Progress Notes (Signed)
Pre visit review using our clinic review tool, if applicable. No additional management support is needed unless otherwise documented below in the visit note. 

## 2014-03-05 NOTE — Progress Notes (Signed)
   Subjective:    Patient ID: Cheyenne Guzman, female    DOB: October 02, 1965, 49 y.o.   MRN: [id number]  HPI Here to refill her Zocor, to get a flu shot, and to check her urine. She has had several days of cloudy strong smelling urine, but no burning or urgency.    Review of Systems  Constitutional: Negative.   Genitourinary: Negative.        Objective:   Physical Exam  Constitutional: She appears well-developed and well-nourished.  Abdominal: Soft. Bowel sounds are normal. She exhibits no distension and no mass. There is no tenderness. There is no rebound and no guarding.          Assessment & Plan:  Her urine is clear, so she is probably a little dehydrated. Advised her to drink more water. Given a flu shot. We will arrange for her to have fasting labs soon.

## 2014-03-11 ENCOUNTER — Ambulatory Visit: Payer: BC Managed Care – PPO | Admitting: Nurse Practitioner

## 2014-03-12 ENCOUNTER — Encounter: Payer: Self-pay | Admitting: Nurse Practitioner

## 2014-03-18 ENCOUNTER — Encounter: Payer: Self-pay | Admitting: Neurology

## 2014-03-18 ENCOUNTER — Ambulatory Visit (INDEPENDENT_AMBULATORY_CARE_PROVIDER_SITE_OTHER): Payer: BLUE CROSS/BLUE SHIELD | Admitting: Neurology

## 2014-03-18 VITALS — BP 138/86 | HR 68 | Ht 67.0 in | Wt 396.0 lb

## 2014-03-18 DIAGNOSIS — R51 Headache: Secondary | ICD-10-CM

## 2014-03-18 DIAGNOSIS — G8929 Other chronic pain: Secondary | ICD-10-CM

## 2014-03-18 DIAGNOSIS — E0842 Diabetes mellitus due to underlying condition with diabetic polyneuropathy: Secondary | ICD-10-CM

## 2014-03-18 DIAGNOSIS — M5416 Radiculopathy, lumbar region: Secondary | ICD-10-CM

## 2014-03-18 DIAGNOSIS — G4733 Obstructive sleep apnea (adult) (pediatric): Secondary | ICD-10-CM

## 2014-03-18 MED ORDER — TOPIRAMATE 100 MG PO TABS: 100.0000 mg | ORAL_TABLET | Freq: Two times a day (BID) | ORAL | Status: DC

## 2014-03-18 NOTE — Progress Notes (Signed)
[facility]  PATIENT: Cheyenne Guzman DOB: 1965/06/01   REASON FOR VISIT: Follow-up for migraine, peripheral neuropathy   HISTORY OF PRESENT ILLNESS: Cheyenne Guzman is a 49 right-handed Caucasian female, referred by ENT Dr. Constance Holster, and her primary care physician Dr. Sarajane Jews for evaluation of headaches with associated dizziness, nausea, neck pain, pressure behind eyes, anxiety, blurred vision. She had a past medical history of obesity, occasionally migraine the past, hyperlipidemia, depression, reported excessive stress recently, a lot of family member is sick. She reported increased frequency of migraines since April 2015, 3 weeks history constant daily headaches, left retro-orbital area, left parietal, occipital region, pounding headaches, with associated light sensitivity, nauseous, movements make it worse, dizziness, lasting couple hours, multiple episode in a day, She also complains of dizziness, spinning sensation, decreased hearing, She was evaluated by Dr. Constance Holster in Jun 19 2013, they will was bilateral symmetric high frequency sensory neural no hearing loss, otherwise normal exam, She is to take off work because of her constant headaches, she also complains of blurry vision,  UPDATE July 10th 2050:Per patient, she was evaluated by her ophthalmologist Dr. Sherral Hammers, with evidence of mild diabetic retinopathy, but otherwise normal,  MRI of the brain was normal.Lab showed elevated CRP 26, ESR 35, Normal TSH, B12. She reported moderate improvement 50% with Topamax, but complains of side effect from medications, includes urinary urgency, fatigue, blurry vision, slurred speech. She also complains of worsening bilateral fingers, and feet paresthesia, could hardly feel the keyboard, recent 3 weeks, she also developed right anterior leg shooting pain, to anterior shin, after prolonged sitting, relieved by standing up, she also complains of worsening depression, crying during today's  clinical visit She still has daily left retro-orbital area headaches, pounding, pressure, lasting 30 minutes to one hour, multiple episodes a day, 3-4 times each day, she is very hesitate to take any pain relief medications, worry about the long-term side effect,She has symptoms of obstructive sleep apnea, daytime fatigue, frequent snoring, catching breath in her sleep UPDATE Sep 3rd 2015: She still has headaches daily, never tried Maxalt, worry about the side effect, she does have vascular risk factor of obesity, hypertension, diabetes, previous TIA like symptoms. Recent sleep study consistent with obstructive sleep apnea, she has been taking Fioricet as needed for migraine, which has been very helpful, she knows to limit the use to 2-3 times each weekShe continued to complains of constant left hand paresthesia, bilateral feet pain, difficulty bearing weight, worsening right groin area shooting pain, triggered by prolonged sitting, standing, walking, she will be evaluated by orthopedic surgeon Dr. Patrice Paradise In September 17  UPDATE Feb 25th 2016: She was seen by [name] in December 2015, reported headache has much improved after starting using CPAP, especially her morning headaches, she had bilateral carpal tunnel release surgery recently,  She is now complaining of worsening low back pain, right lumbar radiculopathy, urinary urgency, occaisonaly incontinience. We have reviewed MRI lumbar spine, October 2015,Moderate spinal stenosis at L4-5 due to listhesis and severe facet arthrosis, not significantly changed. Mild-to-moderate facet arthrosis elsewhere, stable to mildly progressed, without stenosis.  She continue have frequent migraine headaches, moderate 3-4 times each week, taking topiramate ER 200 mg every night, also complains of bilateral feet paresthesia, Fioricet as needed, only with mild to moderate help, she is not a suitable candidate for triptan because of her multiple vascular risk  factors.  REVIEW OF SYSTEMS: Full 14 system review of systems performed and notable only for those listed, all others  are Incontinence of bladder, low back pain, neck stiffness, neck pain, headaches, hearing loss, ringing ears, light sensitivity.    ALLERGIES: No Known Allergies  HOME MEDICATIONS: Outpatient Prescriptions Prior to Visit  Medication Sig Dispense Refill  . albuterol (PROVENTIL HFA;VENTOLIN HFA) 108 (90 BASE) MCG/ACT inhaler Inhale 2 puffs into the lungs every 4 (four) hours as needed for wheezing or shortness of breath.    Marland Kitchen albuterol (PROVENTIL) (2.5 MG/3ML) 0.083% nebulizer solution 1 VIAL IN NEBULIZER EVERY 4 HOURS AS NEEDED FOR WHEEZING 75 mL 1  . ALPRAZolam (XANAX) 1 MG tablet Take 1 tablet (1 mg total) by mouth 3 (three) times daily as needed for anxiety. 90 tablet 2  . aspirin 325 MG EC tablet Take 325 mg by mouth at bedtime.     . butalbital-acetaminophen-caffeine (FIORICET) 50-325-40 MG per tablet Take 1-2 tablets by mouth every 6 (six) hours as needed for headache. 30 tablet 3  . furosemide (LASIX) 40 MG tablet Take 1 tablet (40 mg total) by mouth 2 (two) times daily as needed. As needed for swelling. (Patient not taking: Reported on 03/05/2014) 60 tablet 11  . insulin lispro (HUMALOG) 100 UNIT/ML injection Inject 75 Units into the skin 3 (three) times daily with meals.     . insulin regular (NOVOLIN R,HUMULIN R) 100 units/mL injection Inject 75 Units into the skin 3 (three) times daily before meals. Humulin    . metFORMIN (GLUCOPHAGE) 500 MG tablet Take 1,000 mg by mouth daily after supper.     . Multiple Vitamin (MULTIVITAMIN WITH MINERALS) TABS Take 1 tablet by mouth at bedtime.    . OXcarbazepine ER (OXTELLAR XR) 150 MG TB24 Take 150 mg by mouth at bedtime. (Patient not taking: Reported on 03/05/2014) 30 tablet 11  . sertraline (ZOLOFT) 100 MG tablet Take 2 tablets (200 mg total) by mouth daily. 180 tablet 3  . simvastatin (ZOCOR) 40 MG tablet TAKE 1 TABLET EVERY  EVENING 90 tablet 0  . Topiramate ER 100 MG CP24 Take 200 mg by mouth at bedtime. 180 capsule 3  . simvastatin (ZOCOR) 40 MG tablet Take 1 tablet (40 mg total) by mouth every evening. (Patient not taking: Reported on 03/18/2014) 30 tablet 11   No facility-administered medications prior to visit.    PAST MEDICAL HISTORY: Past Medical History  Diagnosis Date  . Depression   . Hyperlipidemia   . TIA (transient ischemic attack)     in 2007,2008, and 2011  . Urosepsis 08-2009    with Klebsiella  . Breast mass in female   . Asthma     seasonal   . Diabetes mellitus     sees Dr. Chalmers Cater, diagnosed 2003  . Kidney infection     - hosp. [facility]- septic- 10/2009  . Cancer     breast, sees Dr. Jana Hakim , Left - 11/2007  . Arthritis     back & ankles   . Anxiety     per pt. - mild   . Peripheral neuropathy     R sided numbness- face & jaw  . HA (headache)   . Complication of anesthesia 2013    severe muscle spasms-sore-no doc  . Stroke     tia-no deficit  . Sleep apnea 9/15    severe-just started cpap-doing well  . Low back pain   . Hearing loss     Went to [facility],Dr [facility]  . Tinnitus of right ear     PAST SURGICAL HISTORY: Past  Surgical History  Procedure Laterality Date  . Foot surgery  2000    plantar fasciitis-left  . Nerve graft  06-01-08    cadaver graft to right inferior alveolar nerve  in [place] -[place]  . Abdominal hysterectomy  08/2004  . Breast surgery  11/2007    left ductal carcinoma in situ  . Breast biopsy  06/22/2011    Procedure: BREAST BIOPSY;  Surgeon: Stark Klein, MD;  Location: [facility];  Service: General;  Laterality: Right;  . Carpal tunnel release Right 10/13/2013    Procedure: RIGHT CARPAL TUNNEL RELEASE;  Surgeon: Hessie Dibble, MD;  Location: [facility];  Service: Orthopedics;  Laterality: Right;  . Carpal tunnel release Left 11/24/2013    Procedure: LEFT CARPAL TUNNEL RELEASE WITH CYST EXCISION ;  Surgeon:  Hessie Dibble, MD;  Location: [facility];  Service: Orthopedics;  Laterality: Left;  . Ear cyst excision Left 11/24/2013    Procedure: CYST REMOVAL;  Surgeon: Hessie Dibble, MD;  Location: [facility];  Service: Orthopedics;  Laterality: Left;    FAMILY HISTORY: Family History  Problem Relation Age of Onset  . Cancer Mother     breast  . Cancer Father     lung  . Cancer Sister     colon  . Cancer Maternal Aunt     breast - both  . Anesthesia problems Neg Hx     SOCIAL HISTORY: History   Social History  . Marital Status: Married    Spouse Name: [name]  . Number of Children: 2  . Years of Education: 14   Occupational History  .      [employer]   Social History Main Topics  . Smoking status: Former Smoker    Quit date: 02/18/1997  . Smokeless tobacco: Never Used  . Alcohol Use: 0.0 oz/week    0 Standard drinks or equivalent per week     Comment: rare  . Drug Use: No  . Sexual Activity: Not on file   Other Topics Concern  . Not on file   Social History Narrative   Patient lives at home with her husband [name]) and two daughters.    Patient works for [employer] full time patient is on medical leave at this time.   Education two years of college.   Right handed.   Caffeine two times per day.     PHYSICAL EXAM  Filed Vitals:   03/18/14 1043  BP: 138/86  Pulse: 68  Height: '5\' 7"'  (1.702 m)  Weight: 396 lb (179.624 kg)   Body mass index is 62.01 kg/(m^2).  PHYSICAL EXAMNIATION:  Gen: NAD, conversant, well nourised, obese, well groomed                     Cardiovascular: Regular rate rhythm, no peripheral edema, warm, nontender. Eyes: Conjunctivae clear without exudates or hemorrhage Neck: Supple, no carotid bruise. Pulmonary: Clear to auscultation bilaterally   NEUROLOGICAL EXAM:  MENTAL STATUS: Speech:    Speech is normal; fluent and spontaneous with normal comprehension.  Obese, tearful Cognition:    The  patient is oriented to person, place, and time;     recent and remote memory intact;     language fluent;     normal attention, concentration,     fund of knowledge.  CRANIAL NERVES: CN II: Visual fields are full to confrontation. Fundoscopic exam is normal with sharp discs and no vascular changes. Venous  pulsations are present bilaterally. Pupils are 4 mm and briskly reactive to light. Visual acuity is 20/20 bilaterally. CN III, IV, VI: extraocular movement are normal. No ptosis. CN V: Facial sensation is intact to pinprick in all 3 divisions bilaterally. Corneal responses are intact.  CN VII: Face is symmetric with normal eye closure and smile. CN VIII: Hearing is normal to rubbing fingers CN IX, X: Palate elevates symmetrically. Phonation is normal. CN XI: Head turning and shoulder shrug are intact CN XII: Tongue is midline with normal movements and no atrophy.  MOTOR: There is no pronator drift of out-stretched arms. Muscle bulk and tone are normal.     Shoulder abduction Shoulder external rotation Elbow flexion Elbow extension Wrist flexion Wrist extension Finger abduction Hip flexion Knee flexion Knee extension Ankle dorsi flexion Ankle plantar flexion  R '5 5 5 5 5 5 5 5 5 5 4 ' 5-  L '5 5 5 5 5 5 5 5 5 5 5 5    ' REFLEXES: Reflexes are 2+ and symmetric at the biceps, triceps, knees, and ankles. Plantar responses are flexor.  SENSORY: Length dependent decreased light touch, pinprick to distal shin, absent vibratory sensation at distal shin COORDINATION: Rapid alternating movements and fine finger movements are intact. There is no dysmetria on finger-to-nose and heel-knee-shin. There are no abnormal or extraneous movements.   GAIT/STANCE: Antaglic, cautious, unsteady gait,  DIAGNOSTIC DATA (LABS, IMAGING, TESTING) - I reviewed patient records, labs, notes, testing and imaging myself where available.  Lab Results  Component Value Date   WBC 7.2 11/27/2012   HGB 15.3*  11/24/2013   HCT 45.0 11/24/2013   MCV 80.6 11/27/2012   PLT 199 11/27/2012      Component Value Date/Time   NA 142 11/24/2013 1231   NA 142 06/25/2013 1037   K 4.2 11/24/2013 1231   CL 109 11/24/2013 1231   CO2 23 10/12/2013 1230   GLUCOSE 180* 11/24/2013 1231   GLUCOSE 173* 06/25/2013 1037   BUN 14 11/24/2013 1231   BUN 14 06/25/2013 1037   CREATININE 0.90 11/24/2013 1231   CREATININE 0.93 02/01/2012 1317   CALCIUM 9.2 10/12/2013 1230   PROT 7.3 06/25/2013 1037   PROT 7.2 02/01/2012 1317   ALBUMIN 4.4 02/01/2012 1317   AST 21 06/25/2013 1037   ALT 15 06/25/2013 1037   ALKPHOS 85 06/25/2013 1037   BILITOT 0.4 06/25/2013 1037   GFRNONAA 72* 10/12/2013 1230   GFRAA 83* 10/12/2013 1230   Lab Results  Component Value Date   CHOL 148 11/28/2012   HDL 31* 11/28/2012   LDLCALC 78 11/28/2012   LDLDIRECT 189.7 11/27/2006   TRIG 193* 11/28/2012   CHOLHDL 4.8 11/28/2012   Lab Results  Component Value Date   HGBA1C 7.7* 11/28/2012   Lab Results  Component Value Date   VITAMINB12 673 06/25/2013   Lab Results  Component Value Date   TSH 4.280 06/25/2013     ASSESSMENT AND PLAN  49 y.o. year old female  has a past medical history of Depression; Hyperlipidemia; obesity,TIA (transient ischemic attack); Urosepsis (08-2009);  Diabetes mellitus;  Arthritis; Anxiety; Peripheral neuropathy; HA (headache); Stroke; and Sleep apnea  She is here to follow-up for her chronic migraine headaches, lumbar stenosis,  Continue Topamax 100 mg twice a day as migraine prevention, Fioricet as needed   We have reviewed MRI of lumbar spine, which showed moderate canal stenosis at L4-5 level, she is to continue weight loss, moderate exercise,  She also has history  of obstructive sleep apnea, encouraged her continue with daily CPAP use, Return to clinic with [name] in 3-4 months.   Marcial Pacas, M.D. Ph.D.  [facility] [place], [place] [address] Phone:  [phone] Fax:      [phone]

## 2014-03-22 ENCOUNTER — Ambulatory Visit (HOSPITAL_COMMUNITY)
Admission: RE | Admit: 2014-03-22 | Discharge: 2014-03-22 | Disposition: A | Discharge status: Home / Self Care | Payer: BLUE CROSS/BLUE SHIELD | Source: Ambulatory Visit | Attending: Orthopaedic Surgery | Admitting: Orthopaedic Surgery

## 2014-03-22 ENCOUNTER — Ambulatory Visit (HOSPITAL_COMMUNITY): Admission: RE | Admit: 2014-03-22 | Payer: BLUE CROSS/BLUE SHIELD | Source: Ambulatory Visit

## 2014-03-22 ENCOUNTER — Other Ambulatory Visit (HOSPITAL_COMMUNITY): Payer: Self-pay | Admitting: Orthopaedic Surgery

## 2014-03-22 DIAGNOSIS — R32 Unspecified urinary incontinence: Secondary | ICD-10-CM | POA: Diagnosis present

## 2014-03-22 DIAGNOSIS — R531 Weakness: Secondary | ICD-10-CM | POA: Insufficient documentation

## 2014-03-22 DIAGNOSIS — M8938 Hypertrophy of bone, other site: Secondary | ICD-10-CM | POA: Insufficient documentation

## 2014-03-22 DIAGNOSIS — M4806 Spinal stenosis, lumbar region: Secondary | ICD-10-CM | POA: Insufficient documentation

## 2014-03-24 ENCOUNTER — Telehealth: Payer: Self-pay | Admitting: Family Medicine

## 2014-03-24 DIAGNOSIS — M5441 Lumbago with sciatica, right side: Secondary | ICD-10-CM

## 2014-03-24 NOTE — Telephone Encounter (Signed)
done

## 2014-03-24 NOTE — Telephone Encounter (Signed)
STAT Referral request.  Patient has new insurance, [facility] and needs a new referral entered.  She is going to e-mail me a copy of her new insurance card.  She is going to see Dr. Rennis Harding [facility]. [phone].  She has an appointment today at 4.  She's having urinary incontinence and right leg weakness with pain.  Patient states she needs a callback with the reference number once the referral is completed, she said it is okay to leave referral information on her voicemail.  NPI- [id number] ICD-10- M47.16 ; M43.16: M54.31

## 2014-03-29 HISTORY — PX: LUMBAR DISC SURGERY: SHX700

## 2014-05-14 ENCOUNTER — Telehealth: Payer: Self-pay | Admitting: Family Medicine

## 2014-05-14 MED ORDER — INSULIN LISPRO 100 UNIT/ML ~~LOC~~ SOLN
75.0000 [IU] | Freq: Three times a day (TID) | SUBCUTANEOUS | Status: DC
Start: 2014-05-14 — End: 2015-04-19

## 2014-05-14 MED ORDER — METFORMIN HCL 500 MG PO TABS: 1000.0000 mg | ORAL_TABLET | Freq: Two times a day (BID) | ORAL | Status: DC

## 2014-05-14 NOTE — Telephone Encounter (Signed)
She needs a short supply to last until she sees Dr. Chalmers Cater again

## 2014-06-14 ENCOUNTER — Encounter: Payer: Self-pay | Admitting: Family Medicine

## 2014-06-14 NOTE — Telephone Encounter (Signed)
Noted. No she does not need a separate referral to see the NP at the neurology office

## 2014-06-15 ENCOUNTER — Ambulatory Visit (INDEPENDENT_AMBULATORY_CARE_PROVIDER_SITE_OTHER): Payer: 59 | Admitting: Family Medicine

## 2014-06-15 ENCOUNTER — Encounter: Payer: Self-pay | Admitting: Family Medicine

## 2014-06-15 VITALS — HR 95 | Temp 99.3°F

## 2014-06-15 DIAGNOSIS — R309 Painful micturition, unspecified: Secondary | ICD-10-CM

## 2014-06-15 DIAGNOSIS — N390 Urinary tract infection, site not specified: Secondary | ICD-10-CM

## 2014-06-15 LAB — POCT URINALYSIS DIPSTICK
Bilirubin, UA: NEGATIVE
Glucose, UA: NEGATIVE
Ketones, UA: NEGATIVE
Nitrite, UA: NEGATIVE
PH UA: 5.5
UROBILINOGEN UA: 0.2

## 2014-06-15 MED ORDER — SULFAMETHOXAZOLE-TRIMETHOPRIM 800-160 MG PO TABS: 1.0000 | ORAL_TABLET | Freq: Two times a day (BID) | ORAL | Status: DC

## 2014-06-15 NOTE — Progress Notes (Signed)
Pre visit review using our clinic review tool, if applicable. No additional management support is needed unless otherwise documented below in the visit note. Pt declined to weigh 

## 2014-06-15 NOTE — Progress Notes (Signed)
   Subjective:    Patient ID: Cheyenne Guzman, female    DOB: Jan 12, 1966, 49 y.o.   MRN: [id number]  HPI Here for 6 days of urinary pressure and burning. No fever. She is drinking lots of water. She has been on Cipro for the past 4 weeks to cover a cellulitis that has been going on around her spine surgery site. Prior to the Cipro she got Ceftriaxone through a PICC line for several weeks.    Review of Systems  Constitutional: Negative.   Genitourinary: Positive for urgency and frequency. Negative for flank pain.       Objective:   Physical Exam  Constitutional:  In pain, walks with a cane   Abdominal: Soft. Bowel sounds are normal. She exhibits no distension and no mass. There is no tenderness. There is no rebound and no guarding.          Assessment & Plan:  Treat the UTI with Bactrim DS. Culture the sample.

## 2014-06-16 ENCOUNTER — Encounter: Payer: Self-pay | Admitting: Nurse Practitioner

## 2014-06-16 ENCOUNTER — Ambulatory Visit (INDEPENDENT_AMBULATORY_CARE_PROVIDER_SITE_OTHER): Payer: 59 | Admitting: Nurse Practitioner

## 2014-06-16 VITALS — BP 139/80 | HR 84 | Ht 67.5 in | Wt >= 6400 oz

## 2014-06-16 DIAGNOSIS — G43909 Migraine, unspecified, not intractable, without status migrainosus: Secondary | ICD-10-CM

## 2014-06-16 DIAGNOSIS — R519 Headache, unspecified: Secondary | ICD-10-CM

## 2014-06-16 DIAGNOSIS — E0842 Diabetes mellitus due to underlying condition with diabetic polyneuropathy: Secondary | ICD-10-CM | POA: Diagnosis not present

## 2014-06-16 DIAGNOSIS — G4733 Obstructive sleep apnea (adult) (pediatric): Secondary | ICD-10-CM

## 2014-06-16 DIAGNOSIS — R51 Headache: Secondary | ICD-10-CM

## 2014-06-16 DIAGNOSIS — G8929 Other chronic pain: Secondary | ICD-10-CM

## 2014-06-16 NOTE — Progress Notes (Signed)
[facility]  PATIENT: Cheyenne Guzman DOB: 1965/02/01   REASON FOR VISIT: Follow-up for migraines HISTORY FROM: Patient    HISTORY OF PRESENT ILLNESS: HISTORYGail S Guzman is a 67 right-handed Caucasian female, referred by ENT Dr. Constance Holster, and her primary care physician Dr. Sarajane Jews for evaluation of headaches with associated dizziness, nausea, neck pain, pressure behind eyes, anxiety, blurred vision. She had a past medical history of obesity, occasionally migraine the past, hyperlipidemia, depression, reported excessive stress recently, a lot of family member is sick. She reported increased frequency of migraines since April 2015, 3 weeks history constant daily headaches, left retro-orbital area, left parietal, occipital region, pounding headaches, with associated light sensitivity, nauseous, movements make it worse, dizziness, lasting couple hours, multiple episode in a day, She also complains of dizziness, spinning sensation, decreased hearing, She was evaluated by Dr. Constance Holster in Jun 19 2013, they will was bilateral symmetric high frequency sensory neural no hearing loss, otherwise normal exam, She is to take off work because of her constant headaches, she also complains of blurry vision,  UPDATE July 10th 2015:Per patient, she was evaluated by her ophthalmologist Dr. Sherral Hammers, with evidence of mild diabetic retinopathy, but otherwise normal,  MRI of the brain was normal.Lab showed elevated CRP 26, ESR 35, Normal TSH, B12. She reported moderate improvement 50% with Topamax, but complains of side effect from medications, includes urinary urgency, fatigue, blurry vision, slurred speech. She also complains of worsening bilateral fingers, and feet paresthesia, could hardly feel the keyboard, recent 3 weeks, she also developed right anterior leg shooting pain, to anterior shin, after prolonged sitting, relieved by standing up, she also complains of worsening depression, crying during  today's clinical visit She still has daily left retro-orbital area headaches, pounding, pressure, lasting 30 minutes to one hour, multiple episodes a day, 3-4 times each day, she is very hesitate to take any pain relief medications, worry about the long-term side effect,She has symptoms of obstructive sleep apnea, daytime fatigue, frequent snoring, catching breath in her sleep  UPDATE Feb 25th 2016: She was seen by [name] in December 2015, reported headache has much improved after starting using CPAP, especially her morning headaches, she had bilateral carpal tunnel release surgery recently,She is now complaining of worsening low back pain, right lumbar radiculopathy, urinary urgency, occaisonaly incontinience. We have reviewed MRI lumbar spine, October 2015,Moderate spinal stenosis at L4-5 due to listhesis and severe facet arthrosis, not significantly changed. Mild-to-moderate facet arthrosis elsewhere, stable to mildly progressed, without stenosis. She continue have frequent migraine headaches, moderate 3-4 times each week, taking topiramate ER 200 mg every night, also complains of bilateral feet paresthesia, Fioricet as needed, only with mild to moderate help, she is not a suitable candidate for triptan because of her multiple vascular risk factors. UPDATE 06/16/14 Ms. 89, 49 year old female returns for follow-up. She was last seen by Dr. Krista Blue 03/18/2014. Her headaches are fairly well controlled on Topamax. She says she has not recently been using her CPAP because she had a UTI currently on antibiotic  but has to go to the bathroom frequently. She had back surgery 03/29/2014 with repeat to clean out and infection 04/27/2014. She received 3 weeks of IV antibiotic's and is now on oral preparation . She has just concluded in-home physical therapy and is due for follow-up with her surgeon to decide on outpatient therapy. Her wound has still not completely healed she claims. She continues to have some  bilateral feet paresthesias, she claims her diabetes is in  fairly good control some blood sugars in the 200 range but this is intermittent. She returns for reevaluation   REVIEW OF SYSTEMS: Full 14 system review of systems performed and notable only for those listed, all others are neg:  Constitutional: neg  Cardiovascular: Leg swelling Ear/Nose/Throat: Hearing loss ringing in the ears Skin: neg Eyes: Light sensitivity with headaches  Respiratory: neg Gastroitestinal: Urinary incontinence current UTI Hematology/Lymphatic: neg  Endocrine: neg Musculoskeletal:neg Allergy/Immunology: neg Neurological: neg Psychiatric: neg Sleep : neg   ALLERGIES: No Known Allergies  HOME MEDICATIONS: Outpatient Prescriptions Prior to Visit  Medication Sig Dispense Refill  . albuterol (PROVENTIL HFA;VENTOLIN HFA) 108 (90 BASE) MCG/ACT inhaler Inhale 2 puffs into the lungs every 4 (four) hours as needed for wheezing or shortness of breath.    Marland Kitchen albuterol (PROVENTIL) (2.5 MG/3ML) 0.083% nebulizer solution 1 VIAL IN NEBULIZER EVERY 4 HOURS AS NEEDED FOR WHEEZING 75 mL 1  . ALPRAZolam (XANAX) 1 MG tablet Take 1 tablet (1 mg total) by mouth 3 (three) times daily as needed for anxiety. 90 tablet 2  . aspirin 325 MG EC tablet Take 325 mg by mouth at bedtime.     . butalbital-acetaminophen-caffeine (FIORICET) 50-325-40 MG per tablet Take 1-2 tablets by mouth every 6 (six) hours as needed for headache. 30 tablet 3  . furosemide (LASIX) 40 MG tablet Take 1 tablet (40 mg total) by mouth 2 (two) times daily as needed. As needed for swelling. 60 tablet 11  . insulin lispro (HUMALOG) 100 UNIT/ML injection Inject 0.75 mLs (75 Units total) into the skin 3 (three) times daily with meals. (Patient taking differently: Inject into the skin 3 (three) times daily with meals. Use sliding scale) 10 mL 2  . insulin regular (NOVOLIN R,HUMULIN R) 100 units/mL injection Inject 75 Units into the skin 3 (three) times daily before  meals. Humulin N    . metFORMIN (GLUCOPHAGE) 500 MG tablet Take 2 tablets (1,000 mg total) by mouth 2 (two) times daily with a meal. (Patient taking differently: Take 1,000 mg by mouth daily. ) 60 tablet 2  . Multiple Vitamin (MULTIVITAMIN WITH MINERALS) TABS Take 1 tablet by mouth at bedtime.    . sertraline (ZOLOFT) 100 MG tablet Take 2 tablets (200 mg total) by mouth daily. 180 tablet 3  . simvastatin (ZOCOR) 40 MG tablet TAKE 1 TABLET EVERY EVENING 90 tablet 0  . sulfamethoxazole-trimethoprim (BACTRIM DS,SEPTRA DS) 800-160 MG per tablet Take 1 tablet by mouth 2 (two) times daily. 14 tablet 0  . topiramate (TOPAMAX) 100 MG tablet Take 1 tablet (100 mg total) by mouth 2 (two) times daily. 60 tablet 11   No facility-administered medications prior to visit.    PAST MEDICAL HISTORY: Past Medical History  Diagnosis Date  . Depression   . Hyperlipidemia   . TIA (transient ischemic attack)     in 2007,2008, and 2011  . Urosepsis 08-2009    with Klebsiella  . Breast mass in female   . Asthma     seasonal   . Diabetes mellitus     sees Dr. Chalmers Cater, diagnosed 2003  . Kidney infection     - hosp. [facility]- septic- 10/2009  . Cancer     breast, sees Dr. Jana Hakim , Left - 11/2007  . Arthritis     back & ankles   . Anxiety     per pt. - mild   . Peripheral neuropathy     R sided numbness- face & jaw  . HA (headache)   .  Complication of anesthesia 2013    severe muscle spasms-sore-no doc  . Stroke     tia-no deficit  . Sleep apnea 9/15    severe-just started cpap-doing well  . Low back pain   . Hearing loss     Went to [facility],Dr [facility]  . Tinnitus of right ear     PAST SURGICAL HISTORY: Past Surgical History  Procedure Laterality Date  . Foot surgery  2000    plantar fasciitis-left  . Nerve graft  06-01-08    cadaver graft to right inferior alveolar nerve  in [place] -[place]  . Abdominal hysterectomy  08/2004  . Breast surgery  11/2007    left ductal  carcinoma in situ  . Breast biopsy  06/22/2011    Procedure: BREAST BIOPSY;  Surgeon: Stark Klein, MD;  Location: [place];  Service: General;  Laterality: Right;  . Carpal tunnel release Right 10/13/2013    Procedure: RIGHT CARPAL TUNNEL RELEASE;  Surgeon: Hessie Dibble, MD;  Location: [place];  Service: Orthopedics;  Laterality: Right;  . Carpal tunnel release Left 11/24/2013    Procedure: LEFT CARPAL TUNNEL RELEASE WITH CYST EXCISION ;  Surgeon: Hessie Dibble, MD;  Location: [place];  Service: Orthopedics;  Laterality: Left;  . Ear cyst excision Left 11/24/2013    Procedure: CYST REMOVAL;  Surgeon: Hessie Dibble, MD;  Location: [place];  Service: Orthopedics;  Laterality: Left;  . Lumbar disc surgery  03-29-14    Fusion, revision 04-27-14 L4-5 Cauda Equina with graft    FAMILY HISTORY: Family History  Problem Relation Age of Onset  . Cancer Mother     breast  . Cancer Father     lung  . Cancer Sister     colon  . Cancer Maternal Aunt     breast - both  . Anesthesia problems Neg Hx     SOCIAL HISTORY: History   Social History  . Marital Status: Married    Spouse Name: [name]  . Number of Children: 2  . Years of Education: 14   Occupational History  .      [employer]   Social History Main Topics  . Smoking status: Former Smoker    Quit date: 02/18/1997  . Smokeless tobacco: Never Used  . Alcohol Use: 0.0 oz/week    0 Standard drinks or equivalent per week     Comment: rare  . Drug Use: No  . Sexual Activity: Not on file   Other Topics Concern  . Not on file   Social History Narrative   Patient lives at home with her husband [name]) and two daughters.    Patient works for [employer] full time patient is on medical leave at this time.   Education two years of college.   Right handed.   Caffeine two times per day.     PHYSICAL EXAM  Filed Vitals:   06/16/14 1036  BP: 139/80  Pulse: 84    Height: 5' 7.5" (1.715 m)  Weight: 416 lb (188.696 kg)   Body mass index is 64.16 kg/(m^2).  Generalized: Well developed, morbidly obese female in no acute distress  Head: normocephalic and atraumatic,. Oropharynx benign  Neck: Supple, no carotid bruits  Musculoskeletal: No deformity   Neurological examination   Mentation: Alert oriented to time, place, history taking. Attention span and concentration appropriate. Recent and remote memory intact.  Follows all commands speech and  language fluent. Tearful during history  Cranial nerve II-XII: Fundoscopic exam reveals sharp disc margins.Pupils were equal round reactive to light extraocular movements were full, visual field were full on confrontational test. Facial sensation and strength were normal. hearing was intact to finger rubbing bilaterally. Uvula tongue midline. head turning and shoulder shrug were normal and symmetric.Tongue protrusion into cheek strength was normal. Motor: normal bulk and tone, full strength in the BUE, BLE, fine finger movements normal, no pronator drift. No focal weakness Sensory: Length dependent decreased light touch, pinprick to distal shin absent vibratory sensation at distal shin  Coordination: finger-nose-finger, heel-to-shin bilaterally, no dysmetria Reflexes: Brachioradialis 2/2, biceps 2/2, triceps 2/2, patellar 2/2, Achilles 2/2, plantar responses were flexor bilaterally. Gait and Station: Rising up from seated position without assistance, antalgic, cautious unsteady gait, ambulates with single-point cane   DIAGNOSTIC DATA (LABS, IMAGING, TESTING) -  ASSESSMENT AND PLAN  49 y.o. year old female  has a past medical history of Depression; Hyperlipidemia; TIA (transient ischemic attack); Urosepsis (08-2009); Breast mass in female; Asthma; Diabetes mellitus; Kidney infection; Cancer; Arthritis; Anxiety; Peripheral neuropathy; HA (headache); ; Stroke; Sleep apnea (9/15); Low back pain; Hearing loss; and  Tinnitus of right ear. here to follow up for 1. Migraine headaches  2. Obstructive sleep apnea with CPAP  Continue Topamax at current dose does not need refills Get back on  CPAP I explained in particular the risks and ramifications of untreated moderate to severe OSA, especially with respect to cardiovascular disease  including congestive heart failure, difficult to treat hypertension, cardiac arrhythmias, or stroke. Even type 2 diabetes has, in part, been linked to untreated OSA. Symptoms of untreated OSA include daytime sleepiness, memory problems, mood irritability and mood disorder such as depression and anxiety, lack of energy, as well as recurrent headaches, especially morning headaches. We talked about trying to maintain a healthy lifestyle in general, as well as the importance of weight control. I encouraged the patient to eat healthy, exercise daily and keep well hydrated, to keep a scheduled bedtime and wake time routine, to not skip any meals and eat healthy snacks in between meals.  Follow-up in 6-8 months Dennie Bible, [facility], [facility], APRN  [facility] [address] [place], [place] [address] [phone]

## 2014-06-16 NOTE — Patient Instructions (Signed)
Continue Topamax at current dose does not need refills Get back on compliance with CPAP Follow-up in 6-8 months

## 2014-06-16 NOTE — Progress Notes (Signed)
I have reviewed and agreed above plan. 

## 2014-06-18 LAB — URINE CULTURE

## 2014-06-25 ENCOUNTER — Telehealth: Payer: Self-pay | Admitting: Family Medicine

## 2014-06-25 MED ORDER — SULFAMETHOXAZOLE-TRIMETHOPRIM 800-160 MG PO TABS: 1.0000 | ORAL_TABLET | Freq: Two times a day (BID) | ORAL | Status: DC

## 2014-06-25 NOTE — Telephone Encounter (Signed)
Call in Bactrim DS bid for 7 days  

## 2014-06-25 NOTE — Telephone Encounter (Signed)
Pt call to say that she finish all the antibiotic that was rx for her UTI. Pt said she still has the symptons and is asking for another round of antibiotics.     Pharmacy [facility]

## 2014-06-25 NOTE — Telephone Encounter (Signed)
Rx sent to pharmacy.  Called and spoke with pt and pt is aware that rx was sent to pharmacy.

## 2014-06-27 ENCOUNTER — Other Ambulatory Visit: Payer: Self-pay | Admitting: Family Medicine

## 2014-07-08 ENCOUNTER — Telehealth: Payer: Self-pay | Admitting: *Deleted

## 2014-07-08 NOTE — Telephone Encounter (Signed)
Patient [employer] form on [employer].

## 2014-07-10 ENCOUNTER — Other Ambulatory Visit: Payer: Self-pay | Admitting: Family Medicine

## 2014-07-12 ENCOUNTER — Telehealth: Payer: Self-pay | Admitting: *Deleted

## 2014-07-12 NOTE — Telephone Encounter (Signed)
Patient form faxed to [facility] on 07/12/14

## 2014-07-13 ENCOUNTER — Other Ambulatory Visit: Payer: Self-pay | Admitting: Family Medicine

## 2014-07-13 NOTE — Telephone Encounter (Signed)
Pt needs refill sertraline 100 mg twice a day #[address] [facility] [address]

## 2014-07-15 NOTE — Telephone Encounter (Signed)
Refill for one year 

## 2014-07-16 MED ORDER — SERTRALINE HCL 100 MG PO TABS: ORAL_TABLET | ORAL | Status: DC

## 2014-07-16 NOTE — Telephone Encounter (Signed)
I called the pt and she stated the Rx that was sent on 6/21 was incorrect as she takes a total of 200mg  at bedtime.  Correct Rx was sent to the pts pharmacy and she is aware of this.

## 2014-08-09 ENCOUNTER — Encounter: Payer: Self-pay | Admitting: Genetic Counselor

## 2014-08-09 DIAGNOSIS — Z0289 Encounter for other administrative examinations: Secondary | ICD-10-CM

## 2014-08-17 ENCOUNTER — Ambulatory Visit: Payer: BLUE CROSS/BLUE SHIELD | Admitting: Neurology

## 2014-11-22 ENCOUNTER — Other Ambulatory Visit: Payer: Self-pay | Admitting: Orthopaedic Surgery

## 2014-11-22 DIAGNOSIS — M4716 Other spondylosis with myelopathy, lumbar region: Secondary | ICD-10-CM

## 2014-11-24 ENCOUNTER — Other Ambulatory Visit: Payer: Self-pay | Admitting: Radiology

## 2014-11-30 ENCOUNTER — Ambulatory Visit (HOSPITAL_COMMUNITY): Payer: 59

## 2014-11-30 ENCOUNTER — Other Ambulatory Visit (HOSPITAL_COMMUNITY): Payer: 59

## 2014-12-01 ENCOUNTER — Ambulatory Visit (HOSPITAL_COMMUNITY)
Admission: RE | Admit: 2014-12-01 | Discharge: 2014-12-01 | Disposition: A | Discharge status: Home / Self Care | Payer: 59 | Source: Ambulatory Visit | Attending: Orthopaedic Surgery | Admitting: Orthopaedic Surgery

## 2014-12-01 ENCOUNTER — Ambulatory Visit (HOSPITAL_COMMUNITY): Payer: 59

## 2014-12-09 ENCOUNTER — Ambulatory Visit (HOSPITAL_COMMUNITY): Payer: 59

## 2014-12-09 ENCOUNTER — Ambulatory Visit (HOSPITAL_COMMUNITY): Admission: RE | Admit: 2014-12-09 | Payer: 59 | Source: Ambulatory Visit

## 2014-12-23 ENCOUNTER — Ambulatory Visit (INDEPENDENT_AMBULATORY_CARE_PROVIDER_SITE_OTHER): Payer: 59 | Admitting: Nurse Practitioner

## 2014-12-23 ENCOUNTER — Encounter: Payer: Self-pay | Admitting: Nurse Practitioner

## 2014-12-23 VITALS — BP 149/86 | HR 73 | Ht 67.0 in | Wt >= 6400 oz

## 2014-12-23 DIAGNOSIS — G43909 Migraine, unspecified, not intractable, without status migrainosus: Secondary | ICD-10-CM | POA: Diagnosis not present

## 2014-12-23 MED ORDER — TOPIRAMATE 100 MG PO TABS: 100.0000 mg | ORAL_TABLET | Freq: Two times a day (BID) | ORAL | Status: DC

## 2014-12-23 NOTE — Progress Notes (Signed)
[facility]  PATIENT: Cheyenne Guzman DOB: March 07, 1965   REASON FOR VISIT: follow-up for history of migraines, diabetic polyneuropathy, obstructive sleep apnea HISTORY FROM:patient    HISTORY OF PRESENT ILLNESS:Cheyenne Guzman is a 26 right-handed Caucasian female, referred by ENT Dr. Constance Guzman, and her primary care physician Dr. Sarajane Guzman for evaluation of headaches with associated dizziness, nausea, neck pain, pressure behind eyes, anxiety, blurred vision. She had a past medical history of obesity, occasionally migraine the past, hyperlipidemia, depression, reported excessive stress recently, a lot of family member is sick. She reported increased frequency of migraines since April 2015, 3 weeks history constant daily headaches, left retro-orbital area, left parietal, occipital region, pounding headaches, with associated light sensitivity, nauseous, movements make it worse, dizziness, lasting couple hours, multiple episode in a day, She also complains of dizziness, spinning sensation, decreased hearing, She was evaluated by Dr. Constance Guzman in Jun 19 2013, they will was bilateral symmetric high frequency sensory neural no hearing loss, otherwise normal exam, She is to take off work because of her constant headaches, she also complains of blurry vision,  UPDATE July 10th 2015:Per patient, she was evaluated by her ophthalmologist Dr. Sherral Guzman, with evidence of mild diabetic retinopathy, but otherwise normal,  MRI of the brain was normal.Lab showed elevated CRP 26, ESR 35, Normal TSH, B12. She reported moderate improvement 50% with Topamax, but complains of side effect from medications, includes urinary urgency, fatigue, blurry vision, slurred speech. She also complains of worsening bilateral fingers, and feet paresthesia, could hardly feel the keyboard, recent 3 weeks, she also developed right anterior leg shooting pain, to anterior shin, after prolonged sitting, relieved by standing up, she also  complains of worsening depression, crying during today's clinical visit She still has daily left retro-orbital area headaches, pounding, pressure, lasting 30 minutes to one hour, multiple episodes a day, 3-4 times each day, she is very hesitate to take any pain relief medications, worry about the long-term side effect,She has symptoms of obstructive sleep apnea, daytime fatigue, frequent snoring, catching breath in her sleep  UPDATE Feb 25th 2016: She was seen by Cheyenne Guzman in December 2015, reported headache has much improved after starting using CPAP, especially her morning headaches, she had bilateral carpal tunnel release surgery recently,She is now complaining of worsening low back pain, right lumbar radiculopathy, urinary urgency, occaisonaly incontinience. We have reviewed MRI lumbar spine, October 2015,Moderate spinal stenosis at L4-5 due to listhesis and severe facet arthrosis, not significantly changed. Mild-to-moderate facet arthrosis elsewhere, stable to mildly progressed, without stenosis. She continue have frequent migraine headaches, moderate 3-4 times each week, taking topiramate ER 200 mg every night, also complains of bilateral feet paresthesia, Fioricet as needed, only with mild to moderate help, she is not a suitable candidate for triptan because of her multiple vascular risk factors. UPDATE 12/1/16Ms. Guzman, 49 year old female returns for follow-up.She has a history of migraines. Her headaches are fairly well controlled on Topamax. She claims she uses her CPAP 4 days a week She had back surgery 03/29/2014 with repeat to clean out and infection 04/27/2014.  She continues to have some bilateral feet paresthesias, she claims her diabetes is in fairly good control some blood sugars in the 200 range but this is intermittent. She claims she needs to have another back surgery next month.She returns for reevaluation    REVIEW OF SYSTEMS: Full 14 system review of systems performed and notable  only for those listed, all others are neg:  Constitutional: neg  Cardiovascular: leg swelling  Ear/Nose/Throat: neg  Skin: neg Eyes: neg Respiratory: neg Gastroitestinal: neg  Hematology/Lymphatic: neg  Endocrine: neg Musculoskeletal:back pain walking difficulty Allergy/Immunology: neg Neurological:history of migraineg Psychiatric: mild depression Sleep : neg   ALLERGIES: No Known Allergies  HOME MEDICATIONS: Outpatient Prescriptions Prior to Visit  Medication Sig Dispense Refill  . albuterol (PROVENTIL HFA;VENTOLIN HFA) 108 (90 BASE) MCG/ACT inhaler Inhale 2 puffs into the lungs every 4 (four) hours as needed for wheezing or shortness of breath.    Marland Kitchen albuterol (PROVENTIL) (2.5 MG/3ML) 0.083% nebulizer solution 1 VIAL IN NEBULIZER EVERY 4 HOURS AS NEEDED FOR WHEEZING 75 mL 1  . ALPRAZolam (XANAX) 1 MG tablet Take 1 tablet (1 mg total) by mouth 3 (three) times daily as needed for anxiety. 90 tablet 2  . aspirin 325 MG EC tablet Take 325 mg by mouth at bedtime.     . furosemide (LASIX) 40 MG tablet Take 1 tablet (40 mg total) by mouth 2 (two) times daily as needed. As needed for swelling. 60 tablet 11  . insulin lispro (HUMALOG) 100 UNIT/ML injection Inject 0.75 mLs (75 Units total) into the skin 3 (three) times daily with meals. (Patient taking differently: Inject into the skin 3 (three) times daily with meals. Use sliding scale) 10 mL 2  . insulin regular (NOVOLIN R,HUMULIN R) 100 units/mL injection Inject 75 Units into the skin 3 (three) times daily before meals. Humulin N    . metFORMIN (GLUCOPHAGE) 500 MG tablet TAKE 2 TABLETS BY MOUTH TWICE A DAY 60 tablet 2  . methocarbamol (ROBAXIN) 750 MG tablet Take 750 mg by mouth 2 (two) times daily.   1  . sertraline (ZOLOFT) 100 MG tablet Take 2 by mouth at bedtime (Patient taking differently: Take 200 mg by mouth at bedtime. Take 2 by mouth at bedtime) 60 tablet 11  . simvastatin (ZOCOR) 40 MG tablet TAKE 1 TABLET EVERY EVENING 90 tablet  0  . topiramate (TOPAMAX) 100 MG tablet Take 1 tablet (100 mg total) by mouth 2 (two) times daily. 60 tablet 11  . oxymorphone (OPANA ER) 10 MG 12 hr tablet Take 10 mg by mouth every 12 (twelve) hours.     No facility-administered medications prior to visit.    PAST MEDICAL HISTORY: Past Medical History  Diagnosis Date  . Depression   . Hyperlipidemia   . TIA (transient ischemic attack)     in 2007,2008, and 2011  . Urosepsis 08-2009    with Klebsiella  . Breast mass in female   . Asthma     seasonal   . Diabetes mellitus     sees Dr. Chalmers Cater, diagnosed 2003  . Kidney infection     - hosp. [facility]- septic- 10/2009  . Cancer [facility])     breast, sees Dr. Jana Hakim , Left - 11/2007  . Arthritis     back & ankles   . Anxiety     per pt. - mild   . Peripheral neuropathy (H[facility])     R sided numbness- face & jaw  . HA (headache)   . Complication of anesthesia 2013    severe muscle spasms-sore-no doc  . Stroke [facility])     tia-no deficit  . Sleep apnea 9/15    severe-just started cpap-doing well  . Low back pain   . Hearing loss     Went to [facility],Dr [facility]  . Tinnitus of right ear     PAST SURGICAL HISTORY: Past Surgical History  Procedure  Laterality Date  . Foot surgery  2000    plantar fasciitis-left  . Nerve graft  06-01-08    cadaver graft to right inferior alveolar nerve  in [place]  . Abdominal hysterectomy  08/2004  . Breast surgery  11/2007    left ductal carcinoma in situ  . Breast biopsy  06/22/2011    Procedure: BREAST BIOPSY;  Surgeon: Stark Klein, MD;  Location: [place];  Service: General;  Laterality: Right;  . Carpal tunnel release Right 10/13/2013    Procedure: RIGHT CARPAL TUNNEL RELEASE;  Surgeon: Hessie Dibble, MD;  Location: [place];  Service: Orthopedics;  Laterality: Right;  . Carpal tunnel release Left 11/24/2013    Procedure: LEFT CARPAL TUNNEL RELEASE WITH CYST EXCISION ;  Surgeon: Hessie Dibble, MD;   Location: [place];  Service: Orthopedics;  Laterality: Left;  . Ear cyst excision Left 11/24/2013    Procedure: CYST REMOVAL;  Surgeon: Hessie Dibble, MD;  Location: [place];  Service: Orthopedics;  Laterality: Left;  . Lumbar disc surgery  3-Guzman-16    Fusion, revision 04-27-14 L4-5 Cauda Equina with graft    FAMILY HISTORY: Family History  Problem Relation Age of Onset  . Cancer Mother     breast  . Cancer Father     lung  . Cancer Sister     colon  . Cancer Maternal Aunt     breast - both  . Anesthesia problems Neg Hx     SOCIAL HISTORY: Social History   Social History  . Marital Status: Married    Spouse Name: [name]  . Number of Children: 2  . Years of Education: 14   Occupational History  .      [employer]   Social History Main Topics  . Smoking status: Former Smoker    Quit date: 02/18/1997  . Smokeless tobacco: Never Used  . Alcohol Use: 0.0 oz/week    0 Standard drinks or equivalent per week     Comment: rare  . Drug Use: No  . Sexual Activity: Not on file   Other Topics Concern  . Not on file   Social History Narrative   Patient lives at home with her husband [name]) and two daughters.    Patient works for [employer] full time patient is on medical leave at this time.   Education two years of college.   Right handed.   Caffeine two times per day.     PHYSICAL EXAM  Filed Vitals:   12/23/14 1006  BP: 149/86  Pulse: 73  Height: '5\' Guzman"'  (1.702 m)  Weight: 400 lb 3.2 oz (181.53 kg)   Body mass index is 62.67 kg/(m^2). Generalized: Well developed, morbidly obese female in no acute distress  Head: normocephalic and atraumatic,. Oropharynx benign  Neck: Supple, no carotid bruits  Musculoskeletal: No deformity   Neurological examination   Mentation: Alert oriented to time, place, history taking. Attention span and concentration appropriate. Recent and remote memory intact. Follows all commands  speech and language fluent. Tearful during history  Cranial nerve II-XII: Fundoscopic exam reveals sharp disc margins.Pupils were equal round reactive to light extraocular movements were full, visual field were full on confrontational test. Facial sensation and strength were normal. hearing was intact to finger rubbing bilaterally. Uvula tongue midline. head turning and shoulder shrug were normal and symmetric.Tongue protrusion into cheek strength was normal. Motor: normal bulk and tone, full strength in the BUE,  BLE, fine finger movements normal, no pronator drift. No focal weakness Sensory: Length dependent decreased light touch, pinprick to distal shin absent vibratory sensation at distal shin  Coordination: finger-nose-finger, heel-to-shin bilaterally, no dysmetria Reflexes: Brachioradialis 2/2, biceps 2/2, triceps 2/2, patellar 2/2, Achilles 2/2, plantar responses were flexor bilaterally. Gait and Station: Rising up from seated position without assistance, antalgic, cautious gait, no assistive device DIAGNOSTIC DATA (LABS, IMAGING, TESTING) -  ASSESSMENT AND PLAN  49 y.o. year old female  has a past medical history of Depression; Hyperlipidemia; TIA (transient ischemic attack);  Diabetes mellitus;  Arthritis; Anxiety; Peripheral neuropathy ([place]); HA (headache); ; Sleep apnea (9/15); Low back pain;  here to follow up for her headaches which are in good control.  Continue Topamax at current dose will refill Make follow up with Dr. Rexene Alberts  for sleep F/U in 8 months with me Dennie Bible, [facility], [facility], [facility] [address], [place], [place] [address] [phone]

## 2014-12-23 NOTE — Progress Notes (Signed)
I have reviewed and agreed above plan. 

## 2014-12-23 NOTE — Patient Instructions (Signed)
Continue Topamax at current dose will refill Make follow up with Dr. Rexene Alberts  for sleep F/U in8 months

## 2014-12-27 ENCOUNTER — Ambulatory Visit (HOSPITAL_COMMUNITY)
Admission: RE | Admit: 2014-12-27 | Discharge: 2014-12-27 | Disposition: A | Discharge status: Home / Self Care | Payer: 59 | Source: Ambulatory Visit | Attending: Orthopaedic Surgery | Admitting: Orthopaedic Surgery

## 2014-12-27 ENCOUNTER — Other Ambulatory Visit (HOSPITAL_COMMUNITY): Payer: Self-pay | Admitting: Orthopaedic Surgery

## 2014-12-27 DIAGNOSIS — M4716 Other spondylosis with myelopathy, lumbar region: Secondary | ICD-10-CM | POA: Insufficient documentation

## 2014-12-27 DIAGNOSIS — M4806 Spinal stenosis, lumbar region: Secondary | ICD-10-CM | POA: Diagnosis not present

## 2014-12-27 DIAGNOSIS — M160 Bilateral primary osteoarthritis of hip: Secondary | ICD-10-CM | POA: Diagnosis not present

## 2014-12-27 DIAGNOSIS — M545 Low back pain: Secondary | ICD-10-CM

## 2014-12-27 DIAGNOSIS — M47897 Other spondylosis, lumbosacral region: Secondary | ICD-10-CM | POA: Insufficient documentation

## 2014-12-27 DIAGNOSIS — M4807 Spinal stenosis, lumbosacral region: Secondary | ICD-10-CM | POA: Diagnosis not present

## 2014-12-27 DIAGNOSIS — M8938 Hypertrophy of bone, other site: Secondary | ICD-10-CM | POA: Insufficient documentation

## 2014-12-27 LAB — GLUCOSE, CAPILLARY: Glucose-Capillary: 235 mg/dL — ABNORMAL HIGH (ref 65–99)

## 2014-12-27 MED ORDER — LIDOCAINE HCL (PF) 1 % IJ SOLN
INTRAMUSCULAR | Status: AC
  Administered 2014-12-27: 5 mL
  Filled 2014-12-27: qty 10

## 2014-12-27 MED ORDER — ONDANSETRON HCL 4 MG/2ML IJ SOLN: 4.0000 mg | Freq: Four times a day (QID) | INTRAMUSCULAR | Status: DC | PRN

## 2014-12-27 MED ORDER — DIAZEPAM 5 MG PO TABS
ORAL_TABLET | ORAL | Status: AC
  Filled 2014-12-27: qty 2

## 2014-12-27 MED ORDER — DIAZEPAM 5 MG PO TABS
10.0000 mg | ORAL_TABLET | Freq: Once | ORAL | Status: AC
  Administered 2014-12-27: 10 mg via ORAL

## 2014-12-27 MED ORDER — IOHEXOL 180 MG/ML  SOLN
20.0000 mL | Freq: Once | INTRAMUSCULAR | Status: AC | PRN
  Administered 2014-12-27: 15 mL via INTRATHECAL

## 2014-12-27 NOTE — Discharge Instructions (Signed)
Myelography, Care After °These instructions give you information on caring for yourself after your procedure. Your doctor may also give you more specific instructions. Call your doctor if you have any problems or questions after your procedure. °HOME CARE °· Rest the first day. °· When you rest, lie flat, with your head slightly raised (elevated). °· Avoid heavy lifting and activity for 48 hours, or as told by your doctor. °· You may take the bandage (dressing) off one day after the test, or as told by your doctor. °· Take all medicines only as told by your doctor. °· Ask your doctor when it is okay to take a shower or bath. °· Ask your doctor when your test results will be ready and how you can get them. Make sure you follow up and get your results. °· Do not drink alcohol for 24 hours, or as told by your doctor. °· Drink enough fluid to keep your pee (urine) clear or pale yellow. °GET HELP IF:  °· You have a fever. °· You have a headache. °· You feel sick to your stomach (nauseous) or throw up (vomit). °· You have pain or cramping in your belly (abdomen). °GET HELP RIGHT AWAY IF:  °· You have a headache with a stiff neck or fever. °· You have trouble breathing. °· Any of the places where the needles were put in are: °¨ Puffy (swollen) or red. °¨ Sore or hot to the touch. °¨ Draining yellowish-white fluid (pus). °¨ Bleeding. °MAKE SURE YOU: °· Understand these instructions. °· Will watch your condition. °· Will get help right away if you are not doing well or get worse. °  °This information is not intended to replace advice given to you by your health care provider. Make sure you discuss any questions you have with your health care provider. °  °Document Released: 10/18/2007 Document Revised: 01/29/2014 Document Reviewed: 10/03/2011 °Elsevier Interactive Patient Education ©2016 [employer] Inc[employer] ° °

## 2015-02-03 ENCOUNTER — Telehealth: Payer: Self-pay

## 2015-02-03 MED ORDER — TOPIRAMATE 100 MG PO TABS: 100.0000 mg | ORAL_TABLET | Freq: Two times a day (BID) | ORAL | Status: DC

## 2015-02-03 MED ORDER — SERTRALINE HCL 100 MG PO TABS: ORAL_TABLET | ORAL | Status: DC

## 2015-02-03 MED ORDER — MELOXICAM 15 MG PO TABS: 15.0000 mg | ORAL_TABLET | Freq: Every day | ORAL | Status: DC

## 2015-02-03 MED ORDER — METHOCARBAMOL 750 MG PO TABS: 750.0000 mg | ORAL_TABLET | Freq: Two times a day (BID) | ORAL | Status: AC

## 2015-02-03 MED ORDER — GABAPENTIN 300 MG PO CAPS: 300.0000 mg | ORAL_CAPSULE | Freq: Three times a day (TID) | ORAL | Status: DC

## 2015-02-03 NOTE — Telephone Encounter (Signed)
Prescriptions sent to pharmacy

## 2015-02-03 NOTE — Telephone Encounter (Signed)
RX REQUEST:  [facility]:  Sertraline HCL 100 mg tablet-Take 2 tablets by mouth at bedtime #60  Robaxin 750 mg tablet- Take 1 tablet by mouth every 6 to 8 hours as needed for spasms #90  Gabapentin 300 mg capsules- Take 1 capsule by mouth three times daily #90  Topiramate 100 mg tablet-Take 1 tablet by mouth twice daily #60  Mobic- Take 1 tablet by mouth once daily with food #30  Pls advise: it seems that the robaxin was lasted filled by neurology, the gabapentin is not on the current medication list.  Pt was last seen 5.24.2016.

## 2015-02-03 NOTE — Telephone Encounter (Signed)
Refill all these for 6 months

## 2015-03-28 ENCOUNTER — Encounter: Payer: Self-pay | Admitting: Family Medicine

## 2015-03-28 ENCOUNTER — Ambulatory Visit (INDEPENDENT_AMBULATORY_CARE_PROVIDER_SITE_OTHER): Payer: BLUE CROSS/BLUE SHIELD | Admitting: Family Medicine

## 2015-03-28 VITALS — BP 148/78 | HR 87 | Temp 99.2°F

## 2015-03-28 DIAGNOSIS — N390 Urinary tract infection, site not specified: Secondary | ICD-10-CM | POA: Diagnosis not present

## 2015-03-28 DIAGNOSIS — E119 Type 2 diabetes mellitus without complications: Secondary | ICD-10-CM

## 2015-03-28 DIAGNOSIS — R32 Unspecified urinary incontinence: Secondary | ICD-10-CM

## 2015-03-28 LAB — POC URINALSYSI DIPSTICK (AUTOMATED)
Bilirubin, UA: NEGATIVE
Blood, UA: NEGATIVE
GLUCOSE UA: NEGATIVE
Ketones, UA: NEGATIVE
Nitrite, UA: POSITIVE
PROTEIN UA: NEGATIVE
SPEC GRAV UA: 1.025
UROBILINOGEN UA: 0.2
pH, UA: 6

## 2015-03-28 MED ORDER — SULFAMETHOXAZOLE-TRIMETHOPRIM 800-160 MG PO TABS: 1.0000 | ORAL_TABLET | Freq: Two times a day (BID) | ORAL | Status: DC

## 2015-03-28 NOTE — Progress Notes (Signed)
Pre visit review using our clinic review tool, if applicable. No additional management support is needed unless otherwise documented below in the visit note. Pt declined to weigh 

## 2015-03-29 ENCOUNTER — Encounter: Payer: Self-pay | Admitting: Family Medicine

## 2015-03-29 NOTE — Progress Notes (Signed)
   Subjective:    Patient ID: Cheyenne Guzman, female    DOB: 02-08-1965, 50 y.o.   MRN: [id number]  HPI Here for several things. First she has had urinary urgency and burning for a week. No fever. Drinking plenty of water. Also she has struggled with urinary incontinence for years and she asks to see a Dealer. Lastly she asks if she can see someone other than Dr. Chalmers Cater for her diabetes. She has been happy with the care she has received but it is very difficult to see her without waiting weeks or months for an appt. Her glucoses have been stable in the 120-140 range fasting.    Review of Systems  Constitutional: Negative.   Respiratory: Negative.   Cardiovascular: Negative.   Gastrointestinal: Negative.   Genitourinary: Positive for dysuria, urgency and frequency. Negative for flank pain and pelvic pain.       Objective:   Physical Exam  Constitutional: She is oriented to person, place, and time. She appears well-developed and well-nourished.  Neck: No thyromegaly present.  Cardiovascular: Normal rate, regular rhythm, normal heart sounds and intact distal pulses.   Pulmonary/Chest: Effort normal and breath sounds normal.  Abdominal: Soft. Bowel sounds are normal. She exhibits no distension and no mass. There is no tenderness. There is no rebound and no guarding.  Lymphadenopathy:    She has no cervical adenopathy.  Neurological: She is alert and oriented to person, place, and time.          Assessment & Plan:  Treat the UTI with Bactrim DS and culture the sample. Refer to Urology for the incontinence. Refer to [place] for the diabetes.

## 2015-04-01 LAB — URINE CULTURE

## 2015-04-11 ENCOUNTER — Telehealth: Payer: Self-pay | Admitting: Family Medicine

## 2015-04-11 NOTE — Telephone Encounter (Addendum)
Pt was dx with uti on 03-28-15 and has another one and would like same abx call into [facility] [address]

## 2015-04-12 MED ORDER — SULFAMETHOXAZOLE-TRIMETHOPRIM 800-160 MG PO TABS: 1.0000 | ORAL_TABLET | Freq: Two times a day (BID) | ORAL | Status: DC

## 2015-04-12 NOTE — Telephone Encounter (Signed)
Call in Bactrim DS bid for 10 days  

## 2015-04-12 NOTE — Telephone Encounter (Signed)
Rx sent. Left message on machine for patient. 

## 2015-04-19 ENCOUNTER — Encounter: Payer: Self-pay | Admitting: Endocrinology

## 2015-04-19 ENCOUNTER — Ambulatory Visit (INDEPENDENT_AMBULATORY_CARE_PROVIDER_SITE_OTHER): Payer: BLUE CROSS/BLUE SHIELD | Admitting: Endocrinology

## 2015-04-19 VITALS — BP 136/80 | HR 87 | Temp 98.0°F | Ht 67.0 in | Wt 386.0 lb

## 2015-04-19 DIAGNOSIS — E119 Type 2 diabetes mellitus without complications: Secondary | ICD-10-CM | POA: Diagnosis not present

## 2015-04-19 LAB — POCT GLYCOSYLATED HEMOGLOBIN (HGB A1C): HEMOGLOBIN A1C: 9.6

## 2015-04-19 MED ORDER — INSULIN GLARGINE 100 UNIT/ML SOLOSTAR PEN: 200.0000 [IU] | PEN_INJECTOR | SUBCUTANEOUS | Status: DC

## 2015-04-19 NOTE — Patient Instructions (Addendum)
good diet and exercise significantly improve the control of your diabetes.  please let me know if you wish to be referred to a dietician.  high blood sugar is very risky to your health.  you should see an eye doctor and dentist every year.  It is very important to get all recommended vaccinations.   controlling your blood pressure and cholesterol drastically reduces the damage diabetes does to your body.  Those who smoke should quit.  please discuss these with your doctor.  check your blood sugar twice a day.  vary the time of day when you check, between before the 3 meals, and at bedtime.  also check if you have symptoms of your blood sugar being too high or too low.  please keep a record of the readings and bring it to your next appointment here (or you can bring the meter itself).  You can write it on any piece of paper.  please call us sooner if your blood sugar goes below 70, or if you have a lot of readings over 200.   Please consider having weight loss surgery.  It is good for your health.  Here is some information about it.  If you decide to consider further, please call the phone number in the papers, and register for a free informational meeting.   we will need to take this complex situation in stages.   For now, please change both insulins to lantus, 200 units each morning. Please call us next week, to tell us how the blood sugar is doing.   Please come back for a follow-up appointment in 2 months.

## 2015-04-19 NOTE — Progress Notes (Signed)
Subjective:    Patient ID: Cheyenne Guzman, female    DOB: [birth year], 50 y.o.   MRN: [id number]  HPI pt states DM was dx'ed in 2000; she has moderate neuropathy of the lower extremities; she has associated TIA and retinopathy; she has been on insulin since 2009; pt says her diet is fair, but exercise is limited byt health problems.  she has never had GDM, pancreatitis, severe hypoglycemia or DKA.  He has not recently taken her medication, due to being busy with being busy with family responsibilities.  She takes multiple daily injections. She says cbg's vary widely.   Past Medical History  Diagnosis Date  . Depression   . Hyperlipidemia   . TIA (transient ischemic attack)     in 2007,2008, and 2011  . Urosepsis 08-2009    with Klebsiella  . Breast mass in female   . Asthma     seasonal   . Diabetes mellitus     sees Dr. Chalmers Cater, diagnosed 2003  . Kidney infection     - hosp. [facility]- septic- 10/2009  . Cancer [facility])     breast, sees Dr. Jana Hakim , Left - 11/2007  . Arthritis     back & ankles   . Anxiety     per pt. - mild   . Peripheral neuropathy (HCC)     R sided numbness- face & jaw  . HA (headache)   . Complication of anesthesia 2013    severe muscle spasms-sore-no doc  . Stroke [facility])     tia-no deficit  . Sleep apnea 9/15    severe-just started cpap-doing well  . Low back pain   . Hearing loss     Went to [facility],Dr [facility]  . Tinnitus of right ear     Past Surgical History  Procedure Laterality Date  . Foot surgery  2000    plantar fasciitis-left  . Nerve graft  06-01-08    cadaver graft to right inferior alveolar nerve  in [place]  . Abdominal hysterectomy  08/2004  . Breast surgery  11/2007    left ductal carcinoma in situ  . Breast biopsy  06/22/2011    Procedure: BREAST BIOPSY;  Surgeon: Stark Klein, MD;  Location: [place];  Service: General;  Laterality: Right;  . Carpal tunnel release Right 10/13/2013    Procedure: RIGHT  CARPAL TUNNEL RELEASE;  Surgeon: Hessie Dibble, MD;  Location: [place];  Service: Orthopedics;  Laterality: Right;  . Carpal tunnel release Left 11/24/2013    Procedure: LEFT CARPAL TUNNEL RELEASE WITH CYST EXCISION ;  Surgeon: Hessie Dibble, MD;  Location: [place];  Service: Orthopedics;  Laterality: Left;  . Ear cyst excision Left 11/24/2013    Procedure: CYST REMOVAL;  Surgeon: Hessie Dibble, MD;  Location: [place];  Service: Orthopedics;  Laterality: Left;  . Lumbar disc surgery  03-29-14    Fusion, revision 04-27-14 L4-5 Cauda Equina with graft    Social History   Social History  . Marital Status: Married    Spouse Name: [name]  . Number of Children: 2  . Years of Education: 14   Occupational History  .      [employer]   Social History Main Topics  . Smoking status: Former Smoker    Quit date: 02/18/1997  . Smokeless tobacco: Never Used  . Alcohol Use: 0.0 oz/week    0 Standard drinks  or equivalent per week     Comment: rare  . Drug Use: No  . Sexual Activity: Not on file   Other Topics Concern  . Not on file   Social History Narrative   Patient lives at home with her husband [name]) and two daughters.    Patient works for [employer] full time patient is on medical leave at this time.   Education two years of college.   Right handed.   Caffeine two times per day.    Current Outpatient Prescriptions on File Prior to Visit  Medication Sig Dispense Refill  . albuterol (PROVENTIL HFA;VENTOLIN HFA) 108 (90 BASE) MCG/ACT inhaler Inhale 2 puffs into the lungs every 4 (four) hours as needed for wheezing or shortness of breath.    Marland Kitchen albuterol (PROVENTIL) (2.5 MG/3ML) 0.083% nebulizer solution 1 VIAL IN NEBULIZER EVERY 4 HOURS AS NEEDED FOR WHEEZING 75 mL 1  . ALPRAZolam (XANAX) 1 MG tablet Take 1 tablet (1 mg total) by mouth 3 (three) times daily as needed for anxiety. 90 tablet 2  . aspirin 325 MG EC tablet  Take 325 mg by mouth at bedtime. Reported on 04/19/2015    . furosemide (LASIX) 40 MG tablet Take 1 tablet (40 mg total) by mouth 2 (two) times daily as needed. As needed for swelling. 60 tablet 11  . gabapentin (NEURONTIN) 300 MG capsule Take 1 capsule (300 mg total) by mouth 3 (three) times daily. 90 capsule 5  . methocarbamol (ROBAXIN) 750 MG tablet Take 1 tablet (750 mg total) by mouth 2 (two) times daily. 60 tablet 5  . oxycodone (OXY-IR) 5 MG capsule Take 10 mg by mouth 3 (three) times daily.     . sertraline (ZOLOFT) 100 MG tablet Take 2 by mouth at bedtime 60 tablet 5  . simvastatin (ZOCOR) 40 MG tablet TAKE 1 TABLET EVERY EVENING 90 tablet 0  . sulfamethoxazole-trimethoprim (BACTRIM DS,SEPTRA DS) 800-160 MG tablet Take 1 tablet by mouth 2 (two) times daily. 14 tablet 0  . topiramate (TOPAMAX) 100 MG tablet Take 1 tablet (100 mg total) by mouth 2 (two) times daily. (Patient taking differently: Take 100 mg by mouth 2 (two) times daily. Extended release.) 60 tablet 5   No current facility-administered medications on file prior to visit.    No Known Allergies  Family History  Problem Relation Age of Onset  . Cancer Mother     breast  . Cancer Father     lung  . Cancer Sister     colon  . Cancer Maternal Aunt     breast - both  . Anesthesia problems Neg Hx   . Diabetes Sister   . Diabetes Father     BP 136/80 mmHg  Pulse 87  Temp(Src) 98 F (36.7 C) (Oral)  Ht 5\' 7"  (1.702 m)  Wt 386 lb (175.088 kg)  BMI 60.44 kg/m2  SpO2 95%  Review of Systems denies weight loss, blurry vision, chest pain, n/v, muscle cramps, excessive diaphoresis, depression, cold intolerance, rhinorrhea, and easy bruising.  she has chronic low back pain, frequent urination, doe, and headache.     Objective:   Physical Exam VS: see vs page GEN: no distress.  Morbid obesity HEAD: head: no deformity eyes: no periorbital swelling, no proptosis external nose and ears are normal mouth: no lesion  seen NECK: supple, thyroid is not enlarged CHEST WALL: no deformity LUNGS:  Clear to auscultation CV: reg rate and rhythm, no murmur ABD: abdomen is soft, nontender.  no hepatosplenomegaly.  not distended.  no hernia.  Obesity limits exam.  MUSCULOSKELETAL: muscle bulk and strength are grossly normal.  no obvious joint swelling.  gait is normal and steady EXTEMITIES: no deformity.  no ulcer on the feet.  feet are of normal color and temp.  1+ bilat leg edema.  There is bilateral onychomycosis of the toenails.  PULSES: dorsalis pedis intact bilat.  no carotid bruit NEURO:  cn 2-12 grossly intact.   readily moves all 4's.  sensation is intact to touch on the feet SKIN:  Normal texture and temperature.  No rash or suspicious lesion is visible.   NODES:  None palpable at the neck PSYCH: alert, well-oriented.  Does not appear anxious nor depressed.  A1c=9.6%  i personally reviewed electrocardiogram tracing (11/27/12): Indication: chest pain. Impression: normal.   I have reviewed outside records, and summarized: Pt was noted to have severely elevated a1c, and referred here.     Assessment & Plan:  DM: new to me. she needs increased rx caretaker responsibility: for now, she should try a simpler regimen.  We'll refine the insulin schedule later, if pt agrees.  Obesity, severe exacerbation  Patient is advised the following: Patient Instructions  good diet and exercise significantly improve the control of your diabetes.  please let me know if you wish to be referred to a dietician.  high blood sugar is very risky to your health.  you should see an eye doctor and dentist every year.  It is very important to get all recommended vaccinations.   controlling your blood pressure and cholesterol drastically reduces the damage diabetes does to your body.  Those who smoke should quit.  please discuss these with your doctor.  check your blood sugar twice a day.  vary the time of day when you check,  between before the 3 meals, and at bedtime.  also check if you have symptoms of your blood sugar being too high or too low.  please keep a record of the readings and bring it to your next appointment here (or you can bring the meter itself).  You can write it on any piece of paper.  please call us sooner if your blood sugar goes below 70, or if you have a lot of readings over 200.   Please consider having weight loss surgery.  It is good for your health.  Here is some information about it.  If you decide to consider further, please call the phone number in the papers, and register for a free informational meeting.   we will need to take this complex situation in stages.   For now, please change both insulins to lantus, 200 units each morning. Please call us next week, to tell us how the blood sugar is doing.   Please come back for a follow-up appointment in 2 months.

## 2015-06-21 ENCOUNTER — Ambulatory Visit (INDEPENDENT_AMBULATORY_CARE_PROVIDER_SITE_OTHER): Payer: BLUE CROSS/BLUE SHIELD | Admitting: Endocrinology

## 2015-06-21 ENCOUNTER — Encounter: Payer: Self-pay | Admitting: Endocrinology

## 2015-06-21 DIAGNOSIS — E119 Type 2 diabetes mellitus without complications: Secondary | ICD-10-CM

## 2015-06-21 LAB — POCT GLYCOSYLATED HEMOGLOBIN (HGB A1C): HEMOGLOBIN A1C: 8.2

## 2015-06-21 MED ORDER — INSULIN GLARGINE 100 UNIT/ML SOLOSTAR PEN: 150.0000 [IU] | PEN_INJECTOR | SUBCUTANEOUS | Status: DC

## 2015-06-21 NOTE — Patient Instructions (Addendum)
check your blood sugar twice a day.  vary the time of day when you check, between before the 3 meals, and at bedtime.  also check if you have symptoms of your blood sugar being too high or too low.  please keep a record of the readings and bring it to your next appointment here (or you can bring the meter itself).  You can write it on any piece of paper.  please call us sooner if your blood sugar goes below 70, or if you have a lot of readings over 200.     Please continue the lantus, 150 units each morning.  blood tests are requested for you today.  We'll let you know about the results.  On this type of insulin schedule, you should eat meals on a regular schedule.  If a meal is missed or significantly delayed, your blood sugar could go low.  Please come back for a follow-up appointment in 4 months.

## 2015-06-21 NOTE — Progress Notes (Signed)
Subjective:    Patient ID: Cheyenne Guzman, female    DOB: May 03, 1965, 50 y.o.   MRN: [id number]  HPI Pt returns for f/u of diabetes mellitus: DM type: Insulin-requiring type 2 Dx'ed: 2000 Complications: polyneuropathy, TIA and retinopathy Therapy: insulin since 2009 GDM: never DKA: never Severe hypoglycemia: never Pancreatitis: never Other: she declines weight loss surgery; she was changed to qd insulin, after poor results with multiple daily injections Interval history: She has a renewed dietary effort, so she has had to reduce the lantus to 150 units qam.  She says cbg's vary from 104-200.  There is no trend throughout the day.  pt states she feels well in general. Past Medical History  Diagnosis Date  . Depression   . Hyperlipidemia   . TIA (transient ischemic attack)     in 2007,2008, and 2011  . Urosepsis 08-2009    with Klebsiella  . Breast mass in female   . Asthma     seasonal   . Diabetes mellitus     sees Dr. Talmage Nap, diagnosed 2003  . Kidney infection     - hosp. [facility]- septic- 10/2009  . Cancer [facility])     breast, sees Dr. Darnelle Catalan , Left - 11/2007  . Arthritis     back & ankles   . Anxiety     per pt. - mild   . Peripheral neuropathy (HCC)     R sided numbness- face & jaw  . HA (headache)   . Complication of anesthesia 2013    severe muscle spasms-sore-no doc  . Stroke [facility])     tia-no deficit  . Sleep apnea 9/15    severe-just started cpap-doing well  . Low back pain   . Hearing loss     Went to [facility] Th[facility]at,Dr [employer]  . Tinnitus of right ear     Past Surgical History  Procedure Laterality Date  . Foot surgery  2000    plantar fasciitis-left  . Nerve graft  06-01-08    cadaver graft to right inferior alveolar nerve  in [place]  . Abdominal hysterectomy  08/2004  . Breast surgery  11/2007    left ductal carcinoma in situ  . Breast biopsy  06/22/2011    Procedure: BREAST BIOPSY;  Surgeon: Almond Lint, MD;  [facility];  Service: General;  Laterality: Right;  . Carpal tunnel release Right 10/13/2013    Procedure: RIGHT CARPAL TUNNEL RELEASE;  Surgeon: Velna Ochs, MD;  Location: [facility];  Service: Orthopedics;  Laterality: Right;  . Carpal tunnel release Left 11/24/2013    Procedure: LEFT CARPAL TUNNEL RELEASE WITH CYST EXCISION ;  Surgeon: Velna Ochs, MD;  Location: [facility];  Service: Orthopedics;  Laterality: Left;  . Ear cyst excision Left 11/24/2013    Procedure: CYST REMOVAL;  Surgeon: Velna Ochs, MD;  Location: [facility];  Service: Orthopedics;  Laterality: Left;  . Lumbar disc surgery  03-29-14    Fusion, revision 04-27-14 L4-5 Cauda Equina with graft    Social History   Social History  . Marital Status: Married    Spouse Name: Gaynell Face  . Number of Children: 2  . Years of Education: 14   Occupational History  .      [employer] . [employer]   Social History Main Topics  . Smoking status: Former Smoker    Quit date: 02/18/1997  . Smokeless tobacco: Never Used  .  Alcohol Use: 0.0 oz/week    0 Standard drinks or equivalent per week     Comment: rare  . Drug Use: No  . Sexual Activity: Not on file   Other Topics Concern  . Not on file   Social History Narrative   Patient lives at home with her husband [name]) and two daughters.    Patient works for [employer] full time patient is on medical leave at this time.   Education two years of college.   Right handed.   Caffeine two times per day.    Current Outpatient Prescriptions on File Prior to Visit  Medication Sig Dispense Refill  . albuterol (PROVENTIL HFA;VENTOLIN HFA) 108 (90 BASE) MCG/ACT inhaler Inhale 2 puffs into the lungs every 4 (four) hours as needed for wheezing or shortness of breath.    Marland Kitchen albuterol (PROVENTIL) (2.5 MG/3ML) 0.083% nebulizer solution 1 VIAL IN NEBULIZER EVERY 4 HOURS AS NEEDED FOR WHEEZING 75 mL 1  . ALPRAZolam (XANAX) 1 MG tablet Take 1 tablet (1  mg total) by mouth 3 (three) times daily as needed for anxiety. 90 tablet 2  . aspirin 325 MG EC tablet Take 325 mg by mouth at bedtime. Reported on 04/19/2015    . Cranberry-Vitamin C-Vitamin E (CRANBERRY PLUS VITAMIN C) 4200-20-3 MG-MG-UNIT CAPS Take by mouth.    . furosemide (LASIX) 40 MG tablet Take 1 tablet (40 mg total) by mouth 2 (two) times daily as needed. As needed for swelling. 60 tablet 11  . gabapentin (NEURONTIN) 300 MG capsule Take 1 capsule (300 mg total) by mouth 3 (three) times daily. (Patient taking differently: Take 300 mg by mouth 2 (two) times daily. ) 90 capsule 5  . methocarbamol (ROBAXIN) 750 MG tablet Take 1 tablet (750 mg total) by mouth 2 (two) times daily. 60 tablet 5  . Multiple Vitamins-Minerals (MULTIVITAMIN WITH MINERALS) tablet Take 1 tablet by mouth daily.    Marland Kitchen oxycodone (OXY-IR) 5 MG capsule Take 10 mg by mouth 3 (three) times daily.     . sertraline (ZOLOFT) 100 MG tablet Take 2 by mouth at bedtime 60 tablet 5  . simvastatin (ZOCOR) 40 MG tablet TAKE 1 TABLET EVERY EVENING 90 tablet 0  . sulfamethoxazole-trimethoprim (BACTRIM DS,SEPTRA DS) 800-160 MG tablet Take 1 tablet by mouth 2 (two) times daily. 14 tablet 0  . topiramate (TOPAMAX) 100 MG tablet Take 1 tablet (100 mg total) by mouth 2 (two) times daily. (Patient taking differently: Take 100 mg by mouth 2 (two) times daily. Extended release.) 60 tablet 5   No current facility-administered medications on file prior to visit.    No Known Allergies  Family History  Problem Relation Age of Onset  . Cancer Mother     breast  . Cancer Father     lung  . Cancer Sister     colon  . Cancer Maternal Aunt     breast - both  . Anesthesia problems Neg Hx   . Diabetes Sister   . Diabetes Father     BP 138/84 mmHg  Pulse 91  Temp(Src) 99.1 F (37.3 C) (Oral)  Ht 5\' 7"  (1.702 m)  Wt 389 lb (176.449 kg)  BMI 60.91 kg/m2  SpO2 92%  Review of Systems Since on the 150 units qam, she denies hypoglycemia.        Objective:   Physical Exam VITAL SIGNS:  See vs page GENERAL: no distress SKIN:  Insulin injection sites at the anterior abdomen are normal.  A1c=8.2% Fructosamine=327    Assessment & Plan:  Insulin-requiring type 2 DM: this is the best control this pt should aim for, given this regimen, which does match insulin to her changing needs throughout the day TIA: in this context she should avoid hypoglycemia.   Patient is advised the following: Patient Instructions  check your blood sugar twice a day.  vary the time of day when you check, between before the 3 meals, and at bedtime.  also check if you have symptoms of your blood sugar being too high or too low.  please keep a record of the readings and bring it to your next appointment here (or you can bring the meter itself).  You can write it on any piece of paper.  please call us sooner if your blood sugar goes below 70, or if you have a lot of readings over 200.     Please continue the lantus, 150 units each morning.  blood tests are requested for you today.  We'll let you know about the results.  On this type of insulin schedule, you should eat meals on a regular schedule.  If a meal is missed or significantly delayed, your blood sugar could go low.  Please come back for a follow-up appointment in 4 months.    Renato Shin

## 2015-06-22 LAB — FRUCTOSAMINE: Fructosamine: 327 umol/L — ABNORMAL HIGH (ref 0–285)

## 2015-08-10 ENCOUNTER — Other Ambulatory Visit: Payer: Self-pay | Admitting: Family Medicine

## 2015-08-23 ENCOUNTER — Encounter: Payer: Self-pay | Admitting: Nurse Practitioner

## 2015-08-23 ENCOUNTER — Ambulatory Visit (INDEPENDENT_AMBULATORY_CARE_PROVIDER_SITE_OTHER): Payer: BLUE CROSS/BLUE SHIELD | Admitting: Nurse Practitioner

## 2015-08-23 VITALS — BP 135/75 | HR 87 | Ht 67.0 in | Wt 399.6 lb

## 2015-08-23 DIAGNOSIS — G4733 Obstructive sleep apnea (adult) (pediatric): Secondary | ICD-10-CM | POA: Diagnosis not present

## 2015-08-23 DIAGNOSIS — G43909 Migraine, unspecified, not intractable, without status migrainosus: Secondary | ICD-10-CM | POA: Diagnosis not present

## 2015-08-23 DIAGNOSIS — R51 Headache: Secondary | ICD-10-CM | POA: Diagnosis not present

## 2015-08-23 DIAGNOSIS — R519 Headache, unspecified: Secondary | ICD-10-CM

## 2015-08-23 DIAGNOSIS — G8929 Other chronic pain: Secondary | ICD-10-CM

## 2015-08-23 NOTE — Progress Notes (Signed)
[facility]  PATIENT: Cheyenne Guzman DOB: December 05, 1965   REASON FOR VISIT: follow-up for history of migraines, diabetic polyneuropathy, obstructive sleep apnea HISTORY FROM:patient    HISTORY OF PRESENT ILLNESS:Cheyenne Guzman is a 39 right-handed Caucasian female, referred by ENT Dr. Constance Guzman, and her primary care physician Dr. Sarajane Guzman for evaluation of headaches with associated dizziness, nausea, neck pain, pressure behind eyes, anxiety, blurred vision. She had a past medical history of obesity, occasionally migraine the past, hyperlipidemia, depression, reported excessive stress recently, a lot of family member is sick. She reported increased frequency of migraines since April 2015, 3 weeks history constant daily headaches, left retro-orbital area, left parietal, occipital region, pounding headaches, with associated light sensitivity, nauseous, movements make it worse, dizziness, lasting couple hours, multiple episode in a day, She also complains of dizziness, spinning sensation, decreased hearing, She was evaluated by Dr. Constance Guzman in Jun 19 2013, they will was bilateral symmetric high frequency sensory neural no hearing loss, otherwise normal exam, She is to take off work because of her constant headaches, she also complains of blurry vision,  UPDATE July 10th 2015:Per patient, she was evaluated by her ophthalmologist Dr. Sherral Guzman, with evidence of mild diabetic retinopathy, but otherwise normal,  MRI of the brain was normal.Lab showed elevated CRP 26, ESR 35, Normal TSH, B12. She reported moderate improvement 50% with Topamax, but complains of side effect from medications, includes urinary urgency, fatigue, blurry vision, slurred speech. She also complains of worsening bilateral fingers, and feet paresthesia, could hardly feel the keyboard, recent 3 weeks, she also developed right anterior leg shooting pain, to anterior shin, after prolonged sitting, relieved by standing up, she also  complains of worsening depression, crying during today's clinical visit She still has daily left retro-orbital area headaches, pounding, pressure, lasting 30 minutes to one hour, multiple episodes a day, 3-4 times each day, she is very hesitate to take any pain relief medications, worry about the long-term side effect,She has symptoms of obstructive sleep apnea, daytime fatigue, frequent snoring, catching breath in her sleep  UPDATE Feb 25th 2016:YY She was seen by Cheyenne Guzman in December 2015, reported headache has much improved after starting using CPAP, especially her morning headaches, she had bilateral carpal tunnel release surgery recently,She is now complaining of worsening low back pain, right lumbar radiculopathy, urinary urgency, occaisonaly incontinience. We have reviewed MRI lumbar spine, October 2015,Moderate spinal stenosis at L4-5 due to listhesis and severe facet arthrosis, not significantly changed. Mild-to-moderate facet arthrosis elsewhere, stable to mildly progressed, without stenosis. She continue have frequent migraine headaches, moderate 3-4 times each week, taking topiramate ER 200 mg every night, also complains of bilateral feet paresthesia, Fioricet as needed, only with mild to moderate help, she is not a suitable candidate for triptan because of her multiple vascular risk factors. UPDATE 8/1//17CM Ms. 26, 50 year old female returns for follow-up.She has a history of migraines. Her headaches are fairly well controlled on Topamax. Dr. Sharlene Guzman changed her to ER Topamax. She complains that heat  and changes in the weather cause more headaches during the summer time. She has history of obstructive sleep apnea and does not consistently use her CPAP. She has not followed up with Dr. Rexene Guzman.  She had back surgery 03/29/2014 with repeat to clean out and infection 04/27/2014.  She continues to have some bilateral feet paresthesias, she claims her diabetes is in fairly good control  and it is  definitely improved from when last seen. She is seeing a physician in [place]  who is a pain management physician. He has discussed spinal cord stimulator with her. She has not made up her mind if she will pursue that. She returns for reevaluation    REVIEW OF SYSTEMS: Full 14 system review of systems performed and notable only for those listed, all others are neg:  Constitutional: neg  Cardiovascular: leg swelling Ear/Nose/Throat:  Ringing in the ears Skin: neg Eyes:  Light sensitivity Respiratory: neg Gastroitestinal: neg  Hematology/Lymphatic: neg  Endocrine: neg Musculoskeletal:back pain walking difficulty Allergy/Immunology: neg Neurological:history of migraine Psychiatric: mild depression Sleep :  Obstructive sleep apnea not completely compliant CPAP  ALLERGIES: No Known Allergies  HOME MEDICATIONS: Outpatient Medications Prior to Visit  Medication Sig Dispense Refill  . albuterol (PROVENTIL HFA;VENTOLIN HFA) 108 (90 BASE) MCG/ACT inhaler Inhale 2 puffs into the lungs every 4 (four) hours as needed for wheezing or shortness of breath.    Marland Kitchen albuterol (PROVENTIL) (2.5 MG/3ML) 0.083% nebulizer solution 1 VIAL IN NEBULIZER EVERY 4 HOURS AS NEEDED FOR WHEEZING 75 mL 1  . ALPRAZolam (XANAX) 1 MG tablet Take 1 tablet (1 mg total) by mouth 3 (three) times daily as needed for anxiety. 90 tablet 2  . aspirin 325 MG EC tablet Take 325 mg by mouth at bedtime. Reported on 04/19/2015    . furosemide (LASIX) 40 MG tablet Take 1 tablet (40 mg total) by mouth 2 (two) times daily as needed. As needed for swelling. 60 tablet 11  . gabapentin (NEURONTIN) 300 MG capsule Take 1 capsule (300 mg total) by mouth 3 (three) times daily. (Patient taking differently: Take 300 mg by mouth at bedtime. ) 90 capsule 5  . Insulin Glargine (LANTUS SOLOSTAR) 100 UNIT/ML Solostar Pen Inject 150 Units into the skin every morning. and pen needles 2/day 20 pen PRN  . methocarbamol (ROBAXIN) 750 MG tablet Take 1  tablet (750 mg total) by mouth 2 (two) times daily. 60 tablet 5  . Multiple Vitamins-Minerals (MULTIVITAMIN WITH MINERALS) tablet Take 1 tablet by mouth daily.    . sertraline (ZOLOFT) 100 MG tablet TAKE 2 TABLETS BY MOUTH AT BEDTIME 60 tablet 11  . topiramate (TOPAMAX) 100 MG tablet Take 1 tablet (100 mg total) by mouth 2 (two) times daily. (Patient taking differently: Take 100 mg by mouth 2 (two) times daily. Extended release.) 60 tablet 5  . Cranberry-Vitamin C-Vitamin E (CRANBERRY PLUS VITAMIN C) 4200-20-3 MG-MG-UNIT CAPS Take by mouth.    Marland Kitchen oxycodone (OXY-IR) 5 MG capsule Take 10 mg by mouth 3 (three) times daily.     . simvastatin (ZOCOR) 40 MG tablet TAKE 1 TABLET EVERY EVENING 90 tablet 0  . sulfamethoxazole-trimethoprim (BACTRIM DS,SEPTRA DS) 800-160 MG tablet Take 1 tablet by mouth 2 (two) times daily. 14 tablet 0   No facility-administered medications prior to visit.     PAST MEDICAL HISTORY: Past Medical History:  Diagnosis Date  . Anxiety    per pt. - mild   . Arthritis    back & ankles   . Asthma    seasonal   . Breast mass in female   . Cancer [facility])    breast, sees Dr. Jana Hakim , Left - 11/2007  . Complication of anesthesia 2013   severe muscle spasms-sore-no doc  . Depression   . Diabetes mellitus    sees Dr. Chalmers Cater, diagnosed 2003  . HA (headache)   . Hearing loss    Went to [facility]roat,Dr [facility]  . Hyperlipidemia   . Kidney infection    -  hosp. [facility]- septic- 10/2009  . Low back pain   . Peripheral neuropathy (HCC)    R sided numbness- face & jaw  . Sleep apnea 9/15   severe-just started cpap-doing well  . Stroke [facility])    tia-no deficit  . TIA (transient ischemic attack)    in 2007,2008, and 2011  . Tinnitus of right ear   . Urosepsis 08-2009   with Klebsiella    PAST SURGICAL HISTORY: Past Surgical History:  Procedure Laterality Date  . ABDOMINAL HYSTERECTOMY  08/2004  . BREAST BIOPSY  06/22/2011   Procedure: BREAST  BIOPSY;  Surgeon: Stark Klein, MD;  Location: [place];  Service: General;  Laterality: Right;  . BREAST SURGERY  11/2007   left ductal carcinoma in situ  . CARPAL TUNNEL RELEASE Right 10/13/2013   Procedure: RIGHT CARPAL TUNNEL RELEASE;  Surgeon: Hessie Dibble, MD;  Location: [place];  Service: Orthopedics;  Laterality: Right;  . CARPAL TUNNEL RELEASE Left 11/24/2013   Procedure: LEFT CARPAL TUNNEL RELEASE WITH CYST EXCISION ;  Surgeon: Hessie Dibble, MD;  Location: [place];  Service: Orthopedics;  Laterality: Left;  . EAR CYST EXCISION Left 11/24/2013   Procedure: CYST REMOVAL;  Surgeon: Hessie Dibble, MD;  Location: [place];  Service: Orthopedics;  Laterality: Left;  . FOOT SURGERY  2000   plantar fasciitis-left  . LUMBAR DISC SURGERY  03-29-14   Fusion, revision 04-27-14 L4-5 Cauda Equina with graft  . NERVE GRAFT  06-01-08   cadaver graft to right inferior alveolar nerve  [place]    FAMILY HISTORY: Family History  Problem Relation Age of Onset  . Cancer Mother     breast  . Cancer Father     lung  . Diabetes Father   . Cancer Sister     colon  . Cancer Maternal Aunt     breast - both  . Diabetes Sister   . Anesthesia problems Neg Hx     SOCIAL HISTORY: Social History   Social History  . Marital status: Married    Spouse name: [name]  . Number of children: 2  . Years of education: 14   Occupational History  .      [employer]   Social History Main Topics  . Smoking status: Former Smoker    Quit date: 02/18/1997  . Smokeless tobacco: Never Used  . Alcohol use 0.0 oz/week     Comment: rare  . Drug use: No  . Sexual activity: Not on file   Other Topics Concern  . Not on file   Social History Narrative   Patient lives at home with her husband [name]) and two daughters.    Patient works for [employer] full time patient is on medical leave at this time.   Education two years of college.    Right handed.   Caffeine two times per day.     PHYSICAL EXAM  Vitals:   08/23/15 1035  BP: 135/75  Pulse: 87  Weight: (!) 399 lb 9.6 oz (181.3 kg)  Height: '5\' 7"'$  (1.702 m)   Body mass index is 62.59 kg/m. Generalized: Well developed, morbidly obese female in no acute distress  Head: normocephalic and atraumatic,. Oropharynx benign  Neck: Supple, no carotid bruits  Musculoskeletal: No deformity   Neurological examination   Mentation: Alert oriented to time, place, history taking. Attention span and concentration appropriate. Recent and remote memory intact. Follows all commands  speech and language fluent. Tearful during history  Cranial nerve II-XII: Pupils were equal round reactive to light extraocular movements were full, visual field were full on confrontational test. Facial sensation and strength were normal. hearing was intact to finger rubbing bilaterally. Uvula tongue midline. head turning and shoulder shrug were normal and symmetric.Tongue protrusion into cheek strength was normal. Motor: normal bulk and tone, full strength in the BUE, BLE, fine finger movements normal, no pronator drift. No focal weakness Sensory: Length dependent decreased light touch, pinprick to distal shin absent vibratory sensation at ankles Coordination: finger-nose-finger, heel-to-shin bilaterally, no dysmetria Reflexes:  Symmetric upper and lower but depressed plantar responses were flexor bilaterally. Gait and Station: Rising up from seated position without assistance, antalgic, cautious gait, no assistive device DIAGNOSTIC DATA (LABS, IMAGING, TESTING) -  ASSESSMENT AND PLAN  50 y.o. year old female  has a past medical history of Depression; Hyperlipidemia; TIA (transient ischemic attack);  Diabetes mellitus;  Arthritis; Anxiety; Peripheral neuropathy ([place]); HA (headache); ; Sleep apnea (9/15); Low back pain;  here to follow up for her headaches which are in good control. Migraine triggers  are heat and changes in the weather.The patient is a current patient of Dr. Krista Blue who is out of the office today . This note is sent to the work in doctor.     PLAN: Continue Topamax at current dose was changed to ER by Dr. Sharlene Guzman Make follow up with Dr. Rexene Guzman  for sleep F/U in 8 months with me Dennie Bible, [facility], [facility], [facility] [address] [place],  [address] [phone]

## 2015-08-23 NOTE — Patient Instructions (Addendum)
Continue Topamax at current dose changed to ER by Dr. Sharlene Motts Make follow up with Dr. Rexene Alberts  for sleep F/U in 8 months with me

## 2015-08-30 NOTE — Progress Notes (Signed)
I agree with the above plan 

## 2015-10-11 ENCOUNTER — Other Ambulatory Visit: Payer: Self-pay | Admitting: Family Medicine

## 2015-10-11 NOTE — Telephone Encounter (Signed)
Pt request refill  albuterol (PROVENTIL HFA;VENTOLIN HFA) 108 (90 BASE) MCG/ACT inhaler  Pt states she gets this every year and is having a hard time without her inhaler.  [facility]/ [address]

## 2015-10-13 NOTE — Telephone Encounter (Signed)
Okay to refill? 

## 2015-10-14 MED ORDER — ALBUTEROL SULFATE HFA 108 (90 BASE) MCG/ACT IN AERS: 2.0000 | INHALATION_SPRAY | RESPIRATORY_TRACT | 1 refills | Status: DC | PRN

## 2015-10-14 NOTE — Telephone Encounter (Signed)
Rx sent in

## 2015-10-14 NOTE — Telephone Encounter (Signed)
Please refill this 

## 2015-10-18 ENCOUNTER — Encounter: Payer: Self-pay | Admitting: Family Medicine

## 2015-10-18 ENCOUNTER — Ambulatory Visit (INDEPENDENT_AMBULATORY_CARE_PROVIDER_SITE_OTHER): Payer: BLUE CROSS/BLUE SHIELD | Admitting: Family Medicine

## 2015-10-18 ENCOUNTER — Ambulatory Visit (INDEPENDENT_AMBULATORY_CARE_PROVIDER_SITE_OTHER)
Admission: RE | Admit: 2015-10-18 | Discharge: 2015-10-18 | Disposition: A | Discharge status: Home / Self Care | Payer: BLUE CROSS/BLUE SHIELD | Source: Ambulatory Visit | Attending: Family Medicine | Admitting: Family Medicine

## 2015-10-18 VITALS — BP 128/78 | HR 111 | Temp 98.7°F | Ht 67.0 in | Wt >= 6400 oz

## 2015-10-18 DIAGNOSIS — I890 Lymphedema, not elsewhere classified: Secondary | ICD-10-CM | POA: Insufficient documentation

## 2015-10-18 DIAGNOSIS — R0602 Shortness of breath: Secondary | ICD-10-CM | POA: Diagnosis not present

## 2015-10-18 DIAGNOSIS — R600 Localized edema: Secondary | ICD-10-CM

## 2015-10-18 DIAGNOSIS — R7989 Other specified abnormal findings of blood chemistry: Secondary | ICD-10-CM

## 2015-10-18 LAB — CBC WITH DIFFERENTIAL/PLATELET
BASOS PCT: 0.6 % (ref 0.0–3.0)
Basophils Absolute: 0 10*3/uL (ref 0.0–0.1)
EOS PCT: 2.6 % (ref 0.0–5.0)
Eosinophils Absolute: 0.2 10*3/uL (ref 0.0–0.7)
HCT: 40.6 % (ref 36.0–46.0)
Hemoglobin: 13.3 g/dL (ref 12.0–15.0)
LYMPHS ABS: 1.5 10*3/uL (ref 0.7–4.0)
Lymphocytes Relative: 20.7 % (ref 12.0–46.0)
MCHC: 32.7 g/dL (ref 30.0–36.0)
MCV: 76.9 fl — AB (ref 78.0–100.0)
MONO ABS: 0.4 10*3/uL (ref 0.1–1.0)
MONOS PCT: 5.7 % (ref 3.0–12.0)
NEUTROS ABS: 5.2 10*3/uL (ref 1.4–7.7)
NEUTROS PCT: 70.4 % (ref 43.0–77.0)
PLATELETS: 217 10*3/uL (ref 150.0–400.0)
RBC: 5.28 Mil/uL — AB (ref 3.87–5.11)
RDW: 18 % — AB (ref 11.5–15.5)
WBC: 7.4 10*3/uL (ref 4.0–10.5)

## 2015-10-18 LAB — HEPATIC FUNCTION PANEL
ALT: 16 U/L (ref 0–35)
AST: 18 U/L (ref 0–37)
Albumin: 4.1 g/dL (ref 3.5–5.2)
Alkaline Phosphatase: 102 U/L (ref 39–117)
BILIRUBIN DIRECT: 0.1 mg/dL (ref 0.0–0.3)
BILIRUBIN TOTAL: 0.5 mg/dL (ref 0.2–1.2)
TOTAL PROTEIN: 7.6 g/dL (ref 6.0–8.3)

## 2015-10-18 LAB — POC URINALSYSI DIPSTICK (AUTOMATED)
BILIRUBIN UA: NEGATIVE
Blood, UA: NEGATIVE
GLUCOSE UA: NEGATIVE
KETONES UA: NEGATIVE
LEUKOCYTES UA: NEGATIVE
Nitrite, UA: NEGATIVE
Protein, UA: NEGATIVE
SPEC GRAV UA: 1.02
Urobilinogen, UA: 0.2
pH, UA: 5

## 2015-10-18 LAB — TSH: TSH: 4.78 u[IU]/mL — ABNORMAL HIGH (ref 0.35–4.50)

## 2015-10-18 LAB — BASIC METABOLIC PANEL
BUN: 14 mg/dL (ref 6–23)
CHLORIDE: 104 meq/L (ref 96–112)
CO2: 28 meq/L (ref 19–32)
Calcium: 8.9 mg/dL (ref 8.4–10.5)
Creatinine, Ser: 0.92 mg/dL (ref 0.40–1.20)
GFR: 68.43 mL/min (ref >60.00)
Glucose, Bld: 152 mg/dL — ABNORMAL HIGH (ref 70–99)
POTASSIUM: 4.3 meq/L (ref 3.5–5.1)
Sodium: 139 mEq/L (ref 135–145)

## 2015-10-18 LAB — BRAIN NATRIURETIC PEPTIDE: Pro B Natriuretic peptide (BNP): 21 pg/mL (ref 0.0–100.0)

## 2015-10-18 MED ORDER — FUROSEMIDE 40 MG PO TABS: 40.0000 mg | ORAL_TABLET | Freq: Every day | ORAL | 2 refills | Status: DC

## 2015-10-18 NOTE — Progress Notes (Signed)
   Subjective:    Patient ID: Cheyenne Guzman, female    DOB: 07/18/1965, 50 y.o.   MRN: [id number]  HPI Here for swelling all over the body and for SOB. She has gained about 15 lbs in the last month, and her legs are tight and swollen. She gets very SOB on walking short distances. No chest pain or fever. She has developed a cough which is either dry or which produces some clear foamy sputum. She notes that Dr. Patrice Paradise started her on Gabapentin about 7 weeks ago. Se is taking a total of 600 mg once a day at bedtime.    Review of Systems  Constitutional: Positive for fatigue. Negative for fever.  Respiratory: Positive for cough and shortness of breath. Negative for wheezing.   Cardiovascular: Positive for leg swelling. Negative for chest pain and palpitations.  Gastrointestinal: Negative.   Neurological: Negative.        Objective:   Physical Exam  Constitutional: She is oriented to person, place, and time.  Morbidly obese  Neck: No thyromegaly present.  Cardiovascular: Regular rhythm, normal heart sounds and intact distal pulses.   No murmur heard. Rapid rate   Pulmonary/Chest: Effort normal and breath sounds normal. No respiratory distress. She has no wheezes. She has no rales.  Musculoskeletal:  3+ edema in both lower legs   Lymphadenopathy:    She has no cervical adenopathy.  Neurological: She is alert and oriented to person, place, and time.          Assessment & Plan:  Rapid weight gain with swelling in the legs and SOB. We will get labs today including a creatinine and BNP. Send for a CXR. Start back on Lasix 40 mg once daily. These may well be side effects of the Gabapentin but she will stay on this for the time being.  Laurey Morale, MD

## 2015-10-18 NOTE — Progress Notes (Signed)
Pre visit review using our clinic review tool, if applicable. No additional management support is needed unless otherwise documented below in the visit note. 

## 2015-10-19 NOTE — Addendum Note (Signed)
Addended by: [name] A on: 10/19/2015 01:54 PM   Modules accepted: Orders

## 2015-10-21 ENCOUNTER — Telehealth: Payer: Self-pay | Admitting: Family Medicine

## 2015-10-21 ENCOUNTER — Ambulatory Visit: Payer: BLUE CROSS/BLUE SHIELD | Admitting: Endocrinology

## 2015-10-21 NOTE — Telephone Encounter (Signed)
Pt needs blood work results and xray

## 2015-10-25 DIAGNOSIS — Z0289 Encounter for other administrative examinations: Secondary | ICD-10-CM

## 2015-12-01 ENCOUNTER — Other Ambulatory Visit: Payer: Self-pay | Admitting: Orthopaedic Surgery

## 2015-12-01 DIAGNOSIS — G8929 Other chronic pain: Secondary | ICD-10-CM

## 2015-12-01 DIAGNOSIS — M545 Low back pain, unspecified: Secondary | ICD-10-CM

## 2015-12-04 ENCOUNTER — Encounter (HOSPITAL_COMMUNITY): Payer: Self-pay | Admitting: Emergency Medicine

## 2015-12-04 ENCOUNTER — Emergency Department (HOSPITAL_COMMUNITY)
Admission: EM | Admit: 2015-12-04 | Discharge: 2015-12-04 | Disposition: A | Discharge status: Home / Self Care | Payer: BLUE CROSS/BLUE SHIELD | Attending: Emergency Medicine | Admitting: Emergency Medicine

## 2015-12-04 ENCOUNTER — Emergency Department (HOSPITAL_COMMUNITY): Payer: BLUE CROSS/BLUE SHIELD

## 2015-12-04 DIAGNOSIS — Z7982 Long term (current) use of aspirin: Secondary | ICD-10-CM | POA: Insufficient documentation

## 2015-12-04 DIAGNOSIS — Z87891 Personal history of nicotine dependence: Secondary | ICD-10-CM | POA: Insufficient documentation

## 2015-12-04 DIAGNOSIS — J45909 Unspecified asthma, uncomplicated: Secondary | ICD-10-CM | POA: Insufficient documentation

## 2015-12-04 DIAGNOSIS — E1140 Type 2 diabetes mellitus with diabetic neuropathy, unspecified: Secondary | ICD-10-CM | POA: Insufficient documentation

## 2015-12-04 DIAGNOSIS — Z8673 Personal history of transient ischemic attack (TIA), and cerebral infarction without residual deficits: Secondary | ICD-10-CM | POA: Diagnosis not present

## 2015-12-04 DIAGNOSIS — Z853 Personal history of malignant neoplasm of breast: Secondary | ICD-10-CM | POA: Insufficient documentation

## 2015-12-04 DIAGNOSIS — Z794 Long term (current) use of insulin: Secondary | ICD-10-CM | POA: Insufficient documentation

## 2015-12-04 DIAGNOSIS — Z79899 Other long term (current) drug therapy: Secondary | ICD-10-CM | POA: Diagnosis not present

## 2015-12-04 DIAGNOSIS — R0789 Other chest pain: Secondary | ICD-10-CM | POA: Diagnosis present

## 2015-12-04 LAB — COMPREHENSIVE METABOLIC PANEL
ALK PHOS: 80 U/L (ref 38–126)
ALT: 17 U/L (ref 14–54)
AST: 21 U/L (ref 15–41)
Albumin: 3.6 g/dL (ref 3.5–5.0)
Anion gap: 7 (ref 5–15)
BILIRUBIN TOTAL: 0.5 mg/dL (ref 0.3–1.2)
BUN: 15 mg/dL (ref 6–20)
CALCIUM: 9 mg/dL (ref 8.9–10.3)
CO2: 26 mmol/L (ref 22–32)
CREATININE: 1.03 mg/dL — AB (ref 0.44–1.00)
Chloride: 105 mmol/L (ref 101–111)
Glucose, Bld: 170 mg/dL — ABNORMAL HIGH (ref 65–99)
Potassium: 4.2 mmol/L (ref 3.5–5.1)
Sodium: 138 mmol/L (ref 135–145)
TOTAL PROTEIN: 7 g/dL (ref 6.5–8.1)

## 2015-12-04 LAB — CBC WITH DIFFERENTIAL/PLATELET
Basophils Absolute: 0 10*3/uL (ref 0.0–0.1)
Basophils Relative: 0 %
Eosinophils Absolute: 0.2 10*3/uL (ref 0.0–0.7)
Eosinophils Relative: 3 %
HEMATOCRIT: 40.2 % (ref 36.0–46.0)
HEMOGLOBIN: 12.6 g/dL (ref 12.0–15.0)
LYMPHS ABS: 1.5 10*3/uL (ref 0.7–4.0)
LYMPHS PCT: 25 %
MCH: 25.2 pg — AB (ref 26.0–34.0)
MCHC: 31.3 g/dL (ref 30.0–36.0)
MCV: 80.4 fL (ref 78.0–100.0)
MONOS PCT: 8 %
Monocytes Absolute: 0.5 10*3/uL (ref 0.1–1.0)
NEUTROS PCT: 64 %
Neutro Abs: 3.8 10*3/uL (ref 1.7–7.7)
Platelets: 157 10*3/uL (ref 150–400)
RBC: 5 MIL/uL (ref 3.87–5.11)
RDW: 16.2 % — ABNORMAL HIGH (ref 11.5–15.5)
WBC: 5.9 10*3/uL (ref 4.0–10.5)

## 2015-12-04 LAB — LIPASE, BLOOD: LIPASE: 29 U/L (ref 11–51)

## 2015-12-04 LAB — I-STAT TROPONIN, ED: TROPONIN I, POC: 0.02 ng/mL (ref 0.00–0.08)

## 2015-12-04 LAB — D-DIMER, QUANTITATIVE (NOT AT ARMC): D DIMER QUANT: 0.53 ug{FEU}/mL — AB (ref 0.00–0.50)

## 2015-12-04 MED ORDER — GI COCKTAIL ~~LOC~~
30.0000 mL | Freq: Once | ORAL | Status: AC
  Administered 2015-12-04: 30 mL via ORAL
  Filled 2015-12-04: qty 30

## 2015-12-04 MED ORDER — IOPAMIDOL (ISOVUE-370) INJECTION 76%
INTRAVENOUS | Status: AC
  Administered 2015-12-04: 100 mL
  Filled 2015-12-04: qty 100

## 2015-12-04 MED ORDER — PANTOPRAZOLE SODIUM 40 MG PO TBEC
40.0000 mg | DELAYED_RELEASE_TABLET | Freq: Every day | ORAL | Status: DC
  Administered 2015-12-04: 40 mg via ORAL
  Filled 2015-12-04: qty 1

## 2015-12-04 MED ORDER — OMEPRAZOLE 20 MG PO CPDR: 20.0000 mg | DELAYED_RELEASE_CAPSULE | Freq: Every day | ORAL | 0 refills | Status: DC

## 2015-12-04 MED ORDER — ASPIRIN 81 MG PO CHEW
324.0000 mg | CHEWABLE_TABLET | Freq: Once | ORAL | Status: AC
  Administered 2015-12-04: 324 mg via ORAL
  Filled 2015-12-04: qty 4

## 2015-12-04 NOTE — ED Notes (Signed)
Pt requesting PO fluids. Pt also requesting to take oxycodone brought from home. Dr. Rogene Houston, EDP okay with pt having fluids and oxycodone.

## 2015-12-04 NOTE — ED Notes (Signed)
Patient transported to CT 

## 2015-12-04 NOTE — ED Triage Notes (Signed)
Pt states she has had chest pressure and pain worsening over the last week. She states the at approx 0100 the pain became significantly worse. She describes it as a pressure and states there is a burning pain radiating to her left arm and back

## 2015-12-04 NOTE — ED Provider Notes (Signed)
[place] DEPT Provider Note   CSN: [id number] Arrival date & time: 12/04/15  0507     History   Chief Complaint Chief Complaint  Patient presents with  . Chest Pain    HPI Cheyenne Guzman is a 50 y.o. female.  HPI  This is a 50 year old female with a history of diabetes and hyperlipidemia who presents with chest pain. Patient reports a 2 to 3 day history of worsening chest pressure. It has been constant. She has not had any relief. She describes it as pressure and "an elephant sitting on chest. It is over her lower sternum. She also describes burning in her shoulder blades in her left arm. She does report some shortness of breath with exertion. She initially thought her symptoms were related to reflux. She has also had increased belching. She took antacids with minimal relief. Current pain is 6 out of 10. Denies any nausea, vomiting, abdominal pain. Denies any diaphoresis. Denies leg swelling or history of blood clots. Her mother does have a history of DVTs.  Past Medical History:  Diagnosis Date  . Anxiety    per pt. - mild   . Arthritis    back & ankles   . Asthma    seasonal   . Breast mass in female   . Cancer [facility])    breast, sees Dr. Jana Hakim , Left - 11/2007  . Complication of anesthesia 2013   severe muscle spasms-sore-no doc  . Depression   . Diabetes mellitus    sees Dr. Chalmers Cater, diagnosed 2003  . HA (headache)   . Hearing loss    Went to [facility],Dr [facility]  . Hyperlipidemia   . Kidney infection    - hosp. [facility]- septic- 10/2009  . Low back pain   . Peripheral neuropathy (HCC)    R sided numbness- face & jaw  . Sleep apnea 9/15   severe-just started cpap-doing well  . Stroke [facility])    tia-no deficit  . TIA (transient ischemic attack)    in 2007,2008, and 2011  . Tinnitus of right ear   . Urosepsis 08-2009   with Klebsiella    Patient Active Problem List   Diagnosis Date Noted  . Edema of both legs 10/18/2015  .  Migraine 06/16/2014  . Lumbar radiculopathy 03/18/2014  . Diabetic neuropathy ([place]) 01/07/2014  . Congenital spondylolysis of lumbosacral region 11/02/2013  . SPL (spondylolisthesis) 10/08/2013  . Chronic LBP 10/08/2013  . Obstructive sleep apnea 07/31/2013  . HA (headache)   . Tinnitus 06/23/2013  . Hearing loss 06/23/2013  . Headache(784.0) 06/23/2013  . Low back pain 06/10/2013  . SOB (shortness of breath) 11/27/2012  . Chest pain 11/27/2012  . Cellulitis of left leg 07/03/2012  . Morbid obesity ([place]) 07/03/2012  . Abdominal pain 02/01/2012  . Breast mass, right 07/13/2011  . DCIS (ductal carcinoma in situ) of breast, left, treated with [facility] 11/2007 02/19/2011  . TIA 10/13/2009  . BACK PAIN, LUMBAR 10/13/2009  . PYELONEPHRITIS 09/09/2009  . Urinary tract infection, site not specified 08/16/2008  . ASTHMA 02/20/2008  . CANDIDIASIS 08/28/2007  . Diabetes mellitus without complication ([place]) 123XX123  . HYPERLIPIDEMIA 11/26/2006  . ANXIETY 11/26/2006  . DEPRESSION 11/26/2006  . ACUTE BRONCHITIS 11/26/2006    Past Surgical History:  Procedure Laterality Date  . ABDOMINAL HYSTERECTOMY  08/2004  . BREAST BIOPSY  06/22/2011   Procedure: BREAST BIOPSY;  Surgeon: Stark Klein, MD;  Location: [place];  Service: General;  Laterality: Right;  . BREAST SURGERY  11/2007   left ductal carcinoma in situ  . CARPAL TUNNEL RELEASE Right 10/13/2013   Procedure: RIGHT CARPAL TUNNEL RELEASE;  Surgeon: Hessie Dibble, MD;  Location: [place];  Service: Orthopedics;  Laterality: Right;  . CARPAL TUNNEL RELEASE Left 11/24/2013   Procedure: LEFT CARPAL TUNNEL RELEASE WITH CYST EXCISION ;  Surgeon: Hessie Dibble, MD;  Location: [place];  Service: Orthopedics;  Laterality: Left;  . EAR CYST EXCISION Left 11/24/2013   Procedure: CYST REMOVAL;  Surgeon: Hessie Dibble, MD;  Location: [place];  Service: Orthopedics;  Laterality: Left;  . FOOT  SURGERY  2000   plantar fasciitis-left  . LUMBAR DISC SURGERY  03-29-14   Fusion, revision 04-27-14 L4-5 Cauda Equina with graft  . NERVE GRAFT  06-01-08   cadaver graft to right inferior alveolar nerve  in [place]    OB History    No data available       Home Medications    Prior to Admission medications   Medication Sig Start Date End Date Taking? Authorizing Provider  albuterol (PROVENTIL HFA;VENTOLIN HFA) 108 (90 Base) MCG/ACT inhaler Inhale 2 puffs into the lungs every 4 (four) hours as needed for wheezing or shortness of breath. 10/14/15  Yes Laurey Morale, MD  albuterol (PROVENTIL) (2.5 MG/3ML) 0.083% nebulizer solution 1 VIAL IN NEBULIZER EVERY 4 HOURS AS NEEDED FOR WHEEZING 10/13/13  Yes Laurey Morale, MD  ALPRAZolam Duanne Moron) 1 MG tablet Take 1 tablet (1 mg total) by mouth 3 (three) times daily as needed for anxiety. 06/10/13  Yes Laurey Morale, MD  aspirin 325 MG EC tablet Take 325 mg by mouth at bedtime. Reported on 04/19/2015   Yes Historical Provider, MD  furosemide (LASIX) 40 MG tablet Take 1 tablet (40 mg total) by mouth daily. As needed for swelling. 10/18/15  Yes Laurey Morale, MD  Insulin Glargine (LANTUS SOLOSTAR) 100 UNIT/ML Solostar Pen Inject 150 Units into the skin every morning. and pen needles 2/day Patient taking differently: Inject 200 Units into the skin every morning. and pen needles 2/day 06/21/15  Yes Renato Shin, MD  methocarbamol (ROBAXIN) 750 MG tablet Take 1 tablet (750 mg total) by mouth 2 (two) times daily. 02/03/15  Yes Laurey Morale, MD  Multiple Vitamins-Minerals (MULTIVITAMIN WITH MINERALS) tablet Take 1 tablet by mouth daily.   Yes Historical Provider, MD  Oxycodone HCl 10 MG TABS Take 10 mg by mouth 3 (three) times daily.   Yes Historical Provider, MD  sertraline (ZOLOFT) 100 MG tablet TAKE 2 TABLETS BY MOUTH AT BEDTIME 08/10/15  Yes Laurey Morale, MD  topiramate (TOPAMAX) 100 MG tablet Take 1 tablet (100 mg total) by mouth 2 (two) times daily. Patient  taking differently: Take 100 mg by mouth 2 (two) times daily. Extended release. 02/03/15  Yes Laurey Morale, MD    Family History Family History  Problem Relation Age of Onset  . Cancer Mother     breast  . Cancer Father     lung  . Diabetes Father   . Cancer Sister     colon  . Cancer Maternal Aunt     breast - both  . Diabetes Sister   . Anesthesia problems Neg Hx     Social History Social History  Substance Use Topics  . Smoking status: Former Smoker    Quit date: 02/18/1997  . Smokeless tobacco: Never Used  .  Alcohol use 0.0 oz/week     Comment: rare     Allergies   Patient has no known allergies.   Review of Systems Review of Systems  Constitutional: Negative for fever.  Respiratory: Positive for shortness of breath. Negative for cough and chest tightness.   Cardiovascular: Positive for chest pain. Negative for leg swelling.  Gastrointestinal: Negative for abdominal pain, nausea and vomiting.  Genitourinary: Negative for dysuria.  Musculoskeletal: Negative for back pain.  Skin: Negative for wound.  Neurological: Negative for headaches.  Psychiatric/Behavioral: Negative for confusion.  All other systems reviewed and are negative.    Physical Exam Updated Vital Signs BP 126/69   Pulse 72   Temp 97.5 F (36.4 C) (Oral)   Resp 13   Ht 5\' 7"  (1.702 m)   Wt (!) 400 lb (181.4 kg)   SpO2 95%   BMI 62.65 kg/m   Physical Exam  Constitutional: She is oriented to person, place, and time.  Morbidly obese  HENT:  Head: Normocephalic and atraumatic.  Cardiovascular: Normal rate, regular rhythm and normal heart sounds.   No murmur heard. Pulmonary/Chest: Effort normal and breath sounds normal. No respiratory distress. She has no wheezes.  Abdominal: Soft. Bowel sounds are normal. She exhibits no mass. There is tenderness. There is no guarding.  Epigastric tenderness to palpation without rebound or guarding  Musculoskeletal: She exhibits edema.    Neurological: She is alert and oriented to person, place, and time.  Skin: Skin is warm and dry.  Psychiatric: She has a normal mood and affect.  Nursing note and vitals reviewed.    ED Treatments / Results  Labs (all labs ordered are listed, but only abnormal results are displayed) Labs Reviewed  CBC WITH DIFFERENTIAL/PLATELET - Abnormal; Notable for the following:       Result Value   MCH 25.2 (*)    RDW 16.2 (*)    All other components within normal limits  COMPREHENSIVE METABOLIC PANEL - Abnormal; Notable for the following:    Glucose, Bld 170 (*)    Creatinine, Ser 1.03 (*)    All other components within normal limits  D-DIMER, QUANTITATIVE (NOT AT [facility]) - Abnormal; Notable for the following:    D-Dimer, Quant 0.53 (*)    All other components within normal limits  LIPASE, BLOOD  I-STAT TROPOININ, ED    EKG  EKG Interpretation  Date/Time:  Sunday December 04 2015 05:20:53 EST Ventricular Rate:  75 PR Interval:    QRS Duration: 96 QT Interval:  396 QTC Calculation: 443 R Axis:   -11 Text Interpretation:  Sinus rhythm Q waves anteriorly Confirmed by HORTON  MD, COURTNEY ([id number]) on 12/04/2015 5:39:30 AM       Radiology Dg Chest 2 View  Result Date: 12/04/2015 CLINICAL DATA:  Chest pain and tightness for 2 days. Shortness of breath. Upper back and left arm pain tonight. Diabetes. Nonsmoker. EXAM: CHEST  2 VIEW COMPARISON:  10/18/2015 FINDINGS: The heart size and mediastinal contours are within normal limits. Both lungs are clear. The visualized skeletal structures are unremarkable. IMPRESSION: No active cardiopulmonary disease. Electronically Signed   By: Lucienne Capers M.D.   On: 12/04/2015 05:53    Procedures Procedures (including critical care time)  Medications Ordered in ED Medications  pantoprazole (PROTONIX) EC tablet 40 mg (40 mg Oral Given 12/04/15 0710)  gi cocktail (Maalox,Lidocaine,Donnatal) (30 mLs Oral Given 12/04/15 0538)  aspirin chewable  tablet 324 mg (324 mg Oral Given 12/04/15 0536)  Initial Impression / Assessment and Plan / ED Course  I have reviewed the triage vital signs and the nursing notes.  Pertinent labs & imaging results that were available during my care of the patient were reviewed by me and considered in my medical decision making (see chart for details).  Clinical Course     Patient presents for chest pain. Ongoing for the last several days. Some features suggestive of reflux including radiation to the back, belching, some epigastric tenderness. However, patient also reports dyspnea on exertion which would be atypical. She certainly has risk factors for ACS. Low risk for PE; however, age makes her PERC neg.  patient was given a GI cocktail with some improvement of pain. Initial workup including EKG, troponin, and chest x-ray is reassuring. I discussed the findings with the patient and her daughter. She does have some persistent symptoms. They're concerned about her shortness of breath on exertion. While she is very low risk for PE cannot rule this out without a d-dimer. D-dimer was sent and is minimally elevated. Will obtain a CT angio of the chest.  Signed out to oncoming physician.  Final Clinical Impressions(s) / ED Diagnoses   Final diagnoses:  None    New Prescriptions New Prescriptions   No medications on file     Merryl Hacker, MD 12/04/15 [phone]

## 2015-12-04 NOTE — ED Notes (Signed)
Pt returned from CT °

## 2015-12-04 NOTE — ED Notes (Signed)
Lab to add on d-dimer 

## 2015-12-04 NOTE — ED Notes (Signed)
Pt ambulatory w/ steady gait to restroom. 

## 2015-12-04 NOTE — Discharge Instructions (Addendum)
Take the Prilosec as directed. Chest pain may be related to reflux disease. Follow-up with cardiology in your doctor as above. Return for any new or worse symptoms. CT of the chest without any acute findings.

## 2015-12-04 NOTE — ED Provider Notes (Signed)
Results for orders placed or performed during the hospital encounter of 12/04/15  CBC with Differential  Result Value Ref Range   WBC 5.9 4.0 - 10.5 K/uL   RBC 5.00 3.87 - 5.11 MIL/uL   Hemoglobin 12.6 12.0 - 15.0 g/dL   HCT 40.2 36.0 - 46.0 %   MCV 80.4 78.0 - 100.0 fL   MCH 25.2 (L) 26.0 - 34.0 pg   MCHC 31.3 30.0 - 36.0 g/dL   RDW 16.2 (H) 11.5 - 15.5 %   Platelets 157 150 - 400 K/uL   Neutrophils Relative % 64 %   Neutro Abs 3.8 1.7 - 7.7 K/uL   Lymphocytes Relative 25 %   Lymphs Abs 1.5 0.7 - 4.0 K/uL   Monocytes Relative 8 %   Monocytes Absolute 0.5 0.1 - 1.0 K/uL   Eosinophils Relative 3 %   Eosinophils Absolute 0.2 0.0 - 0.7 K/uL   Basophils Relative 0 %   Basophils Absolute 0.0 0.0 - 0.1 K/uL  Comprehensive metabolic panel  Result Value Ref Range   Sodium 138 135 - 145 mmol/L   Potassium 4.2 3.5 - 5.1 mmol/L   Chloride 105 101 - 111 mmol/L   CO2 26 22 - 32 mmol/L   Glucose, Bld 170 (H) 65 - 99 mg/dL   BUN 15 6 - 20 mg/dL   Creatinine, Ser 1.03 (H) 0.44 - 1.00 mg/dL   Calcium 9.0 8.9 - 10.3 mg/dL   Total Protein 7.0 6.5 - 8.1 g/dL   Albumin 3.6 3.5 - 5.0 g/dL   AST 21 15 - 41 U/L   ALT 17 14 - 54 U/L   Alkaline Phosphatase 80 38 - 126 U/L   Total Bilirubin 0.5 0.3 - 1.2 mg/dL   GFR calc non Af Amer >60 >60 mL/min   GFR calc Af Amer >60 >60 mL/min   Anion gap 7 5 - 15  Lipase, blood  Result Value Ref Range   Lipase 29 11 - 51 U/L  D-dimer, quantitative (not at [facility])  Result Value Ref Range   D-Dimer, Quant 0.53 (H) 0.00 - 0.50 ug/mL-FEU  I-Stat Troponin, ED (not at [facility])  Result Value Ref Range   Troponin i, poc 0.02 0.00 - 0.08 ng/mL   Comment 3           Dg Chest 2 View  Result Date: 12/04/2015 CLINICAL DATA:  Chest pain and tightness for 2 days. Shortness of breath. Upper back and left arm pain tonight. Diabetes. Nonsmoker. EXAM: CHEST  2 VIEW COMPARISON:  10/18/2015 FINDINGS: The heart size and mediastinal contours are within normal limits. Both lungs  are clear. The visualized skeletal structures are unremarkable. IMPRESSION: No active cardiopulmonary disease. Electronically Signed   By: Lucienne Capers M.D.   On: 12/04/2015 05:53   Ct Angio Chest Pe W And/or Wo Contrast  Result Date: 12/04/2015 CLINICAL DATA:  Chest pain and elevated D-dimer. EXAM: CT ANGIOGRAPHY CHEST WITH CONTRAST TECHNIQUE: Multidetector CT imaging of the chest was performed using the standard protocol during bolus administration of intravenous contrast. Multiplanar CT image reconstructions and MIPs were obtained to evaluate the vascular anatomy. CONTRAST:  100 mL Isovue 370 IV COMPARISON:  Prior CTA of the chest on 11/28/2012 FINDINGS: Cardiovascular: The pulmonary arteries are well opacified. There is no evidence of pulmonary embolism. Thoracic aorta shows stable normal caliber without evidence of dissection. The heart size is normal. No pericardial fluid. Mediastinum/Nodes: No mediastinal masses. No evidence of mediastinal, hilar or axillary lymphadenopathy. Lungs/Pleura:  Pulmonary venous hypertensive changes present without evidence of overt edema. There is no evidence of airspace consolidation, pneumothorax, nodule or pleural fluid. Upper Abdomen: Stable hepatic steatosis.  Top-normal spleen size. Musculoskeletal: Mild thoracic spondylosis. No bony lesions or fractures. Review of the MIP images confirms the above findings. IMPRESSION: No evidence of pulmonary embolism or other acute findings in the chest. Electronically Signed   By: Aletta Edouard M.D.   On: 12/04/2015 09:57    DT scan without evidence of pulmonary embolus or any acute pulmonary findings. Patient will be discharged home with diagnoses atypical chest pain and follow-up with cardiology in a record Dr. Dr. Dina Rich ordered a trial of Prilosec for her to try in case this is reflux disease.   Fredia Sorrow, MD 12/04/15 1040

## 2015-12-24 ENCOUNTER — Other Ambulatory Visit: Payer: BLUE CROSS/BLUE SHIELD

## 2015-12-24 ENCOUNTER — Ambulatory Visit
Admission: RE | Admit: 2015-12-24 | Discharge: 2015-12-24 | Disposition: A | Discharge status: Home / Self Care | Payer: BLUE CROSS/BLUE SHIELD | Source: Ambulatory Visit | Attending: Orthopaedic Surgery | Admitting: Orthopaedic Surgery

## 2015-12-24 DIAGNOSIS — M545 Low back pain, unspecified: Secondary | ICD-10-CM

## 2015-12-24 DIAGNOSIS — G8929 Other chronic pain: Secondary | ICD-10-CM

## 2015-12-24 MED ORDER — GADOBENATE DIMEGLUMINE 529 MG/ML IV SOLN
20.0000 mL | Freq: Once | INTRAVENOUS | Status: AC | PRN
  Administered 2015-12-24: 20 mL via INTRAVENOUS

## 2015-12-27 ENCOUNTER — Other Ambulatory Visit: Payer: Self-pay | Admitting: Family Medicine

## 2015-12-27 ENCOUNTER — Telehealth: Payer: Self-pay | Admitting: Family Medicine

## 2015-12-27 DIAGNOSIS — N390 Urinary tract infection, site not specified: Secondary | ICD-10-CM

## 2015-12-27 NOTE — Telephone Encounter (Signed)
Pt thinks that she has a UTI and would like to come by and drop some urine to have it checked.  Pt is aware that she may have to have an appointment.  May I have a order?

## 2015-12-27 NOTE — Telephone Encounter (Signed)
Pt scheduled  

## 2015-12-27 NOTE — Telephone Encounter (Signed)
Per Dr. Sarajane Jews okay to order and I did put in computer.

## 2015-12-27 NOTE — Telephone Encounter (Signed)
Can you call pt to schedule this? 

## 2015-12-28 ENCOUNTER — Other Ambulatory Visit: Payer: BLUE CROSS/BLUE SHIELD

## 2015-12-29 ENCOUNTER — Other Ambulatory Visit: Payer: BLUE CROSS/BLUE SHIELD

## 2016-01-27 ENCOUNTER — Other Ambulatory Visit (INDEPENDENT_AMBULATORY_CARE_PROVIDER_SITE_OTHER): Payer: BLUE CROSS/BLUE SHIELD

## 2016-01-27 DIAGNOSIS — N390 Urinary tract infection, site not specified: Secondary | ICD-10-CM | POA: Diagnosis not present

## 2016-01-27 LAB — POC URINALSYSI DIPSTICK (AUTOMATED)
BILIRUBIN UA: NEGATIVE
GLUCOSE UA: NEGATIVE
Ketones, UA: NEGATIVE
Spec Grav, UA: >=1.03
UROBILINOGEN UA: 0.2
pH, UA: 5

## 2016-01-30 ENCOUNTER — Other Ambulatory Visit: Payer: Self-pay | Admitting: Family Medicine

## 2016-01-30 ENCOUNTER — Telehealth: Payer: Self-pay | Admitting: Family Medicine

## 2016-01-30 LAB — URINE CULTURE

## 2016-01-30 MED ORDER — CIPROFLOXACIN HCL 500 MG PO TABS: 500.0000 mg | ORAL_TABLET | Freq: Two times a day (BID) | ORAL | 0 refills | Status: DC

## 2016-01-30 NOTE — Telephone Encounter (Signed)
This is a duplicate, see actual result note.

## 2016-01-30 NOTE — Telephone Encounter (Signed)
See me Result note on the urine culture

## 2016-01-30 NOTE — Telephone Encounter (Signed)
Pt would like results of UA done Friday. Pt not any better.

## 2016-02-09 ENCOUNTER — Other Ambulatory Visit: Payer: Self-pay | Admitting: Family Medicine

## 2016-02-25 ENCOUNTER — Other Ambulatory Visit: Payer: Self-pay | Admitting: Family Medicine

## 2016-02-28 ENCOUNTER — Telehealth: Payer: Self-pay | Admitting: Family Medicine

## 2016-02-28 NOTE — Telephone Encounter (Signed)
Call in Cipro 500 mg bid for 7 days  

## 2016-02-28 NOTE — Telephone Encounter (Signed)
Pt would like to have antibiotic state that she was in here about a month and she thinks that she has another UTI pt refused appointment.  Pharm:  [facility]

## 2016-02-29 MED ORDER — CIPROFLOXACIN HCL 500 MG PO TABS: 500.0000 mg | ORAL_TABLET | Freq: Two times a day (BID) | ORAL | 0 refills | Status: DC

## 2016-02-29 NOTE — Addendum Note (Signed)
Addended by: Aggie Hacker A on: 02/29/2016 02:54 PM   Modules accepted: Orders

## 2016-02-29 NOTE — Telephone Encounter (Signed)
I sent script e-scribe to [facility] and spoke with pt.  

## 2016-03-06 ENCOUNTER — Other Ambulatory Visit: Payer: Self-pay | Admitting: Orthopaedic Surgery

## 2016-03-06 DIAGNOSIS — M25561 Pain in right knee: Secondary | ICD-10-CM

## 2016-03-09 ENCOUNTER — Ambulatory Visit
Admission: RE | Admit: 2016-03-09 | Discharge: 2016-03-09 | Disposition: A | Discharge status: Home / Self Care | Payer: BLUE CROSS/BLUE SHIELD | Source: Ambulatory Visit | Attending: Orthopaedic Surgery | Admitting: Orthopaedic Surgery

## 2016-03-09 DIAGNOSIS — M25561 Pain in right knee: Secondary | ICD-10-CM

## 2016-03-13 ENCOUNTER — Other Ambulatory Visit: Payer: Self-pay | Admitting: Orthopaedic Surgery

## 2016-03-14 ENCOUNTER — Ambulatory Visit (INDEPENDENT_AMBULATORY_CARE_PROVIDER_SITE_OTHER): Payer: BLUE CROSS/BLUE SHIELD | Admitting: Family Medicine

## 2016-03-14 ENCOUNTER — Other Ambulatory Visit: Payer: BLUE CROSS/BLUE SHIELD

## 2016-03-14 ENCOUNTER — Encounter: Payer: Self-pay | Admitting: Family Medicine

## 2016-03-14 VITALS — BP 120/80 | HR 84 | Temp 98.0°F

## 2016-03-14 DIAGNOSIS — N3001 Acute cystitis with hematuria: Secondary | ICD-10-CM | POA: Diagnosis not present

## 2016-03-14 DIAGNOSIS — E039 Hypothyroidism, unspecified: Secondary | ICD-10-CM | POA: Diagnosis not present

## 2016-03-14 LAB — POC URINALSYSI DIPSTICK (AUTOMATED)
Bilirubin, UA: NEGATIVE
Glucose, UA: NEGATIVE
KETONES UA: NEGATIVE
Nitrite, UA: NEGATIVE
PH UA: 5
Urobilinogen, UA: 0.2

## 2016-03-14 MED ORDER — SULFAMETHOXAZOLE-TRIMETHOPRIM 800-160 MG PO TABS: 1.0000 | ORAL_TABLET | Freq: Two times a day (BID) | ORAL | 0 refills | Status: DC

## 2016-03-14 MED ORDER — ALPRAZOLAM 1 MG PO TABS: 1.0000 mg | ORAL_TABLET | Freq: Three times a day (TID) | ORAL | 2 refills | Status: DC | PRN

## 2016-03-14 NOTE — Progress Notes (Signed)
Pre visit review using our clinic review tool, if applicable. No additional management support is needed unless otherwise documented below in the visit note. 

## 2016-03-14 NOTE — Progress Notes (Signed)
   Subjective:    Patient ID: Cheyenne Guzman, female    DOB: 08-25-1965, 51 y.o.   MRN: [id number]  HPI Here for recurrent UTI symptoms. She saw [place] on 01-27-16 with typical urinary urgency and burning and was treated with a course of Cipro. She seemed to improve for awhile but then the symptoms returned. The culture from that day grew an E coli, and it appeared to be sensitive to Cipro. When the symptoms came back she was given a second round of Cipro, and again the symptoms improved but never went away completely. Now for the past 2 days she has felt worse than ever with fever to 99.3 degrees, fatigue, and nausea without vomiting. No unusual back pains.    Review of Systems  Constitutional: Positive for fatigue and fever.  Respiratory: Negative.   Cardiovascular: Negative.   Gastrointestinal: Positive for nausea. Negative for abdominal distention, abdominal pain, blood in stool, constipation and vomiting.  Genitourinary: Positive for dysuria, frequency and urgency. Negative for flank pain and hematuria.       Objective:   Physical Exam  Constitutional: She is oriented to person, place, and time. She appears well-developed and well-nourished.  Cardiovascular: Normal rate, regular rhythm, normal heart sounds and intact distal pulses.   Pulmonary/Chest: Effort normal and breath sounds normal.  Abdominal: Soft. Bowel sounds are normal. She exhibits no distension and no mass. There is no tenderness. There is no rebound and no guarding.  No CVAT   Neurological: She is alert and oriented to person, place, and time.          Assessment & Plan:  Recurrent UTI. Reculture today's sample. Drink plenty of water. Given a Rocephin shot and she will take 10 days of Bactrim DS. Recheck prn.  [name], MD

## 2016-03-15 MED ORDER — CEFTRIAXONE SODIUM 1 G IJ SOLR
1.0000 g | Freq: Once | INTRAMUSCULAR | Status: AC
  Administered 2016-03-14: 1 g via INTRAMUSCULAR

## 2016-03-16 LAB — URINE CULTURE

## 2016-03-21 ENCOUNTER — Other Ambulatory Visit: Payer: Self-pay | Admitting: Family Medicine

## 2016-03-21 MED ORDER — NITROFURANTOIN MONOHYD MACRO 100 MG PO CAPS: 100.0000 mg | ORAL_CAPSULE | Freq: Two times a day (BID) | ORAL | 0 refills | Status: DC

## 2016-03-21 NOTE — H&P (Signed)
Cheyenne Guzman is an 51 y.o. female.   Chief Complaint: Right knee pain HPI: Cheyenne Guzman continues with some terrible knee pain.  She's been on a walker for about 3 weeks at this point since a twisting injury in the bathroom.  She's been through an MRI scan since last time she was here.  She is here today with Cheyenne Guzman.  Her pain is extreme despite the fact that she takes oxycodone 10 mg 3 times a day through Dr. Patrice Paradise.   MRI:  I reviewed an MRI scan films and report of a study done at [facility] on 03/09/16.  This shows a radial tear of the medial meniscus with a paucity of degenerative change.  Past Medical History:  Diagnosis Date  . Anxiety    per pt. - mild   . Arthritis    back & ankles   . Asthma    seasonal   . Breast mass in female   . Cancer [facility])    breast, sees Dr. Jana Hakim , Left - 11/2007  . Complication of anesthesia 2013   severe muscle spasms-sore-no doc  . Depression   . Diabetes mellitus    sees Dr. Chalmers Cater, diagnosed 2003  . HA (headache)   . Hearing loss    Went to [facility]ro[facility],Dr [facility]  . Hyperlipidemia   . Kidney infection    - hosp. [facility]- septic- 10/2009  . Low back pain   . Peripheral neuropathy (HCC)    R sided numbness- face & jaw  . Sleep apnea 9/15   severe-just started cpap-doing well  . Stroke [facility])    tia-no deficit  . TIA (transient ischemic attack)    in 2007,2008, and 2011  . Tinnitus of right ear   . Urosepsis 08-2009   with Klebsiella    Past Surgical History:  Procedure Laterality Date  . ABDOMINAL HYSTERECTOMY  08/2004  . BREAST BIOPSY  06/22/2011   Procedure: BREAST BIOPSY;  Surgeon: Stark Klein, MD;  Location: [place];  Service: General;  Laterality: Right;  . BREAST SURGERY  11/2007   left ductal carcinoma in situ  . CARPAL TUNNEL RELEASE Right 10/13/2013   Procedure: RIGHT CARPAL TUNNEL RELEASE;  Surgeon: Hessie Dibble, MD;  Location: [place];  Service: Orthopedics;  Laterality: Right;  .  CARPAL TUNNEL RELEASE Left 11/24/2013   Procedure: LEFT CARPAL TUNNEL RELEASE WITH CYST EXCISION ;  Surgeon: Hessie Dibble, MD;  Location: [place];  Service: Orthopedics;  Laterality: Left;  . EAR CYST EXCISION Left 11/24/2013   Procedure: CYST REMOVAL;  Surgeon: Hessie Dibble, MD;  Location: [place];  Service: Orthopedics;  Laterality: Left;  . FOOT SURGERY  2000   plantar fasciitis-left  . LUMBAR DISC SURGERY  03-29-14   Fusion, revision 04-27-14 L4-5 Cauda Equina with graft  . NERVE GRAFT  06-01-08   cadaver graft to right inferior alveolar nerve  [place]    Family History  Problem Relation Age of Onset  . Cancer Mother     breast  . Cancer Father     lung  . Diabetes Father   . Cancer Sister     colon  . Cancer Maternal Aunt     breast - both  . Diabetes Sister   . Anesthesia problems Neg Hx    Social History:  reports that she quit smoking about 19 years ago. She has never used smokeless tobacco. She  reports that she drinks alcohol. She reports that she does not use drugs.  Allergies: No Known Allergies  No prescriptions prior to admission.    No results found for this or any previous visit (from the past 48 hour(s)). No results found.  Review of Systems  Musculoskeletal: Positive for joint pain.       Right knee  All other systems reviewed and are negative.   There were no vitals taken for this visit. Physical Exam  Constitutional: She is oriented to person, place, and time. She appears well-developed and well-nourished.  HENT:  Head: Normocephalic and atraumatic.  Eyes: Pupils are equal, round, and reactive to light.  Neck: Normal range of motion.  Cardiovascular: Normal rate.   Respiratory: Effort normal.  GI: Soft.  Musculoskeletal:  Right knee motion is about 0-90 limited by the size of her thigh.  She has extreme pain to palpation along the medial joint line and on the anterior aspect of the knee.  She does  have perhaps a trace effusion but it's difficult to be sure due to the size of her leg.   Hip motion is full and pain free and SLR is negative on both sides.  There is no palpable LAD behind either knee.   Neurological: She is alert and oriented to person, place, and time.  Skin: Skin is warm and dry.  Psychiatric: She has a normal mood and affect. Her behavior is normal. Judgment and thought content normal.     Assessment/Plan Assessment:  Right knee torn medial meniscus by MRI 2018  Plan: Cheyenne Guzman has extreme pain at the knee.  She is on a walker and cannot sleep or walk very far.  I've offered her a knee arthroscopy.  She does have a torn meniscus on MRI and a paucity of degenerative change.  I think we can make her better.  Obviously with her weight and diabetes she will have an increased chance of complication specifically infection.  I reviewed that with her in some detail.  I also told her she is at increased risk for blood clots.  Nevertheless I don't see much of an alternative.    Clary Meeker, Larwance Sachs, PA-C 03/21/2016, 1:25 PM

## 2016-03-23 ENCOUNTER — Other Ambulatory Visit (HOSPITAL_COMMUNITY): Payer: Self-pay | Admitting: *Deleted

## 2016-03-23 ENCOUNTER — Inpatient Hospital Stay (HOSPITAL_COMMUNITY)
Admission: RE | Admit: 2016-03-23 | Discharge: 2016-03-23 | Disposition: A | Discharge status: Home / Self Care | Payer: BLUE CROSS/BLUE SHIELD | Source: Ambulatory Visit

## 2016-03-23 NOTE — Pre-Procedure Instructions (Signed)
Cheyenne Guzman  03/23/2016    Your procedure is scheduled on Tuesday, March 27, 2016 at 12:55 PM.   Report to [facility] Entrance "A" Admitting Office at 10:55 AM.   Call this number if you have problems the morning of surgery: [phone]   Questions prior to day of surgery, please call [phone] between 8 & 4 PM.   Remember:  Do not eat food or drink liquids after midnight Monday, 03/26/16.  Take these medicines the morning of surgery with A SIP OF WATER: Omeprazole (Prilosec), Topiramate (Topamax), Oxycodone, Albuterol nebulizer - if needed, Albuterol inhaler - if needed (bring inhaler with you day of surgery)  Stop Aspirin 5 days prior to surgery unless instructed differently by physician.  Stop Multivitamins 5 days prior to surgery. Do not  Use NSAIDS (Ibuprofen, Aleve, etc) 5 days prior to surgery   Do not wear jewelry, make-up or nail polish.  Do not wear lotions, powders or perfumes.  Do not shave 48 hours prior to surgery.    Do not bring valuables to the hospital.  [facility] is not responsible for any belongings or valuables.  Contacts, dentures or bridgework may not be worn into surgery.  Leave your suitcase in the car.  After surgery it may be brought to your room.  For patients admitted to the hospital, discharge time will be determined by your treatment team.  Patients discharged the day of surgery will not be allowed to drive home.   Special instructions:  [place] - Preparing for Surgery  Before surgery, you can play an important role.  Because skin is not sterile, your skin needs to be as free of germs as possible.  You can reduce the number of germs on you skin by washing with CHG (chlorahexidine gluconate) soap before surgery.  CHG is an antiseptic cleaner which kills germs and bonds with the skin to continue killing germs even after washing.  Please DO NOT use if you have an allergy to CHG or antibacterial soaps.  If your skin becomes  reddened/irritated stop using the CHG and inform your nurse when you arrive at Short Stay.  Do not shave (including legs and underarms) for at least 48 hours prior to the first CHG shower.  You may shave your face.  Please follow these instructions carefully:   1.  Shower with CHG Soap the night before surgery and the                    morning of Surgery.  2.  If you choose to wash your hair, wash your hair first as usual with your       normal shampoo.  3.  After you shampoo, rinse your hair and body thoroughly to remove the shampoo.  4.  Use CHG as you would any other liquid soap.  You can apply chg directly       to the skin and wash gently with scrungie or a clean washcloth.  5.  Apply the CHG Soap to your body ONLY FROM THE NECK DOWN.        Do not use on open wounds or open sores.  Avoid contact with your eyes, ears, mouth and genitals (private parts).  Wash genitals (private parts) with your normal soap.  6.  Wash thoroughly, paying special attention to the area where your surgery        will be performed.  7.  Thoroughly rinse your body with warm  water from the neck down.  8.  DO NOT shower/wash with your normal soap after using and rinsing off       the CHG Soap.  9.  Pat yourself dry with a clean towel.            10.  Wear clean pajamas.            11.  Place clean sheets on your bed the night of your first shower and do not        sleep with pets.  Day of Surgery  Do not apply any lotions the morning of surgery.  Please wear clean clothes to the hospital.  Please read over the fact sheets that you were given.

## 2016-03-27 ENCOUNTER — Encounter (HOSPITAL_COMMUNITY): Admission: RE | Payer: Self-pay | Source: Ambulatory Visit

## 2016-03-27 ENCOUNTER — Ambulatory Visit (HOSPITAL_COMMUNITY)
Admission: RE | Admit: 2016-03-27 | Payer: BLUE CROSS/BLUE SHIELD | Source: Ambulatory Visit | Admitting: Orthopaedic Surgery

## 2016-03-27 SURGERY — ARTHROSCOPY, KNEE
Anesthesia: Choice | Laterality: Right

## 2016-03-30 ENCOUNTER — Other Ambulatory Visit: Payer: Self-pay | Admitting: Family Medicine

## 2016-03-30 ENCOUNTER — Other Ambulatory Visit (INDEPENDENT_AMBULATORY_CARE_PROVIDER_SITE_OTHER): Payer: BLUE CROSS/BLUE SHIELD

## 2016-03-30 ENCOUNTER — Telehealth: Payer: Self-pay | Admitting: Family Medicine

## 2016-03-30 DIAGNOSIS — N390 Urinary tract infection, site not specified: Secondary | ICD-10-CM | POA: Diagnosis not present

## 2016-03-30 DIAGNOSIS — R7989 Other specified abnormal findings of blood chemistry: Secondary | ICD-10-CM

## 2016-03-30 LAB — POC URINALSYSI DIPSTICK (AUTOMATED)
Bilirubin, UA: NEGATIVE
Blood, UA: NEGATIVE
GLUCOSE UA: NEGATIVE
KETONES UA: NEGATIVE
LEUKOCYTES UA: NEGATIVE
Nitrite, UA: NEGATIVE
PROTEIN UA: NEGATIVE
SPEC GRAV UA: 1.025
UROBILINOGEN UA: 0.2
pH, UA: 5

## 2016-03-30 NOTE — Telephone Encounter (Signed)
I left a voice message for pt to stop by office and leave a urine sample.

## 2016-03-30 NOTE — Telephone Encounter (Signed)
Per Dr. Sarajane Jews okay to order UA and I did put in the computer.

## 2016-03-30 NOTE — Telephone Encounter (Signed)
I spoke with pt  

## 2016-03-30 NOTE — Addendum Note (Signed)
Addended by: Honor Loh L on: 03/30/2016 04:00 PM   Modules accepted: Orders

## 2016-03-30 NOTE — Telephone Encounter (Signed)
Pt states she has another odor to her urine and would like to know if she can come by and give a urine specimen

## 2016-03-30 NOTE — Addendum Note (Signed)
Addended by: Tomi Likens on: 03/30/2016 04:01 PM   Modules accepted: Orders

## 2016-03-31 LAB — T3, FREE: T3, Free: 2.5 pg/mL (ref 2.3–4.2)

## 2016-03-31 LAB — TSH: TSH: 6.45 mIU/L — ABNORMAL HIGH

## 2016-03-31 LAB — T4, FREE: Free T4: 1 ng/dL (ref 0.8–1.8)

## 2016-04-04 ENCOUNTER — Encounter: Payer: Self-pay | Admitting: Family Medicine

## 2016-04-04 NOTE — Addendum Note (Signed)
Addended by: [name] A on: 04/04/2016 01:48 PM   Modules accepted: Orders

## 2016-04-04 NOTE — Telephone Encounter (Signed)
I spoke with pt and this has already been done.

## 2016-04-06 ENCOUNTER — Ambulatory Visit: Payer: BLUE CROSS/BLUE SHIELD | Admitting: Family Medicine

## 2016-04-20 ENCOUNTER — Other Ambulatory Visit: Payer: Self-pay | Admitting: Endocrinology

## 2016-04-20 NOTE — Telephone Encounter (Signed)
Please refill x 1 Ov is due  

## 2016-04-23 ENCOUNTER — Ambulatory Visit: Payer: BLUE CROSS/BLUE SHIELD | Admitting: Nurse Practitioner

## 2016-04-23 ENCOUNTER — Telehealth: Payer: Self-pay | Admitting: Nurse Practitioner

## 2016-04-23 NOTE — Telephone Encounter (Signed)
Patient called office today to reschedule today at 10:45am advised patient of no show/cancellation office policy and $68 fee and message has been sent to billing.  Patient will be contacted to reschedule appointment.

## 2016-04-24 ENCOUNTER — Encounter: Payer: Self-pay | Admitting: Nurse Practitioner

## 2016-05-09 ENCOUNTER — Telehealth: Payer: Self-pay | Admitting: Family Medicine

## 2016-05-09 NOTE — Telephone Encounter (Signed)
Call in #[phone] with 3 rf 

## 2016-05-09 NOTE — Telephone Encounter (Signed)
Refill request for Sertralline 100 mg take 2 po qhs and a [facility]

## 2016-05-10 MED ORDER — SERTRALINE HCL 100 MG PO TABS: 200.0000 mg | ORAL_TABLET | Freq: Every day | ORAL | 3 refills | Status: DC

## 2016-05-10 NOTE — Telephone Encounter (Signed)
I sent script e-scribe for a [facility] supply to [facility].

## 2016-05-16 DIAGNOSIS — Z0289 Encounter for other administrative examinations: Secondary | ICD-10-CM

## 2016-05-23 ENCOUNTER — Ambulatory Visit: Payer: BLUE CROSS/BLUE SHIELD | Admitting: Nurse Practitioner

## 2016-05-24 ENCOUNTER — Encounter: Payer: Self-pay | Admitting: Nurse Practitioner

## 2016-06-02 ENCOUNTER — Other Ambulatory Visit: Payer: Self-pay | Admitting: Endocrinology

## 2016-06-02 NOTE — Telephone Encounter (Signed)
Please refill x 1 Ov is due  

## 2016-07-04 ENCOUNTER — Telehealth: Payer: Self-pay | Admitting: Family Medicine

## 2016-07-04 ENCOUNTER — Other Ambulatory Visit: Payer: Self-pay | Admitting: Endocrinology

## 2016-07-04 MED ORDER — INSULIN GLARGINE 100 UNIT/ML SOLOSTAR PEN: 150.0000 [IU] | PEN_INJECTOR | SUBCUTANEOUS | 0 refills | Status: DC

## 2016-07-04 NOTE — Telephone Encounter (Signed)
Rx submitted pending appointment.

## 2016-07-04 NOTE — Telephone Encounter (Signed)
Patient calling for LANTUS SOLOSTAR 100 UNIT/ML Solostar Pen refill.  I sched appt for her on 07/16 at 1:00pm.  She has torn maniscus right knee and degenerative disc as well as two surgeries in back which is why she has not been to your office in a year.  Please advise,  -LL

## 2016-08-01 ENCOUNTER — Other Ambulatory Visit: Payer: Self-pay | Admitting: Endocrinology

## 2016-08-01 ENCOUNTER — Other Ambulatory Visit (INDEPENDENT_AMBULATORY_CARE_PROVIDER_SITE_OTHER): Payer: BLUE CROSS/BLUE SHIELD

## 2016-08-01 DIAGNOSIS — E119 Type 2 diabetes mellitus without complications: Secondary | ICD-10-CM

## 2016-08-01 LAB — T4, FREE: Free T4: 0.6 ng/dL (ref 0.60–1.60)

## 2016-08-01 LAB — TSH: TSH: 1.6 u[IU]/mL (ref 0.35–4.50)

## 2016-08-05 ENCOUNTER — Other Ambulatory Visit: Payer: Self-pay | Admitting: Family Medicine

## 2016-08-06 ENCOUNTER — Encounter: Payer: Self-pay | Admitting: Endocrinology

## 2016-08-06 ENCOUNTER — Ambulatory Visit (INDEPENDENT_AMBULATORY_CARE_PROVIDER_SITE_OTHER): Payer: BLUE CROSS/BLUE SHIELD | Admitting: Endocrinology

## 2016-08-06 VITALS — BP 142/84 | HR 94 | Ht 67.0 in | Wt >= 6400 oz

## 2016-08-06 DIAGNOSIS — E119 Type 2 diabetes mellitus without complications: Secondary | ICD-10-CM

## 2016-08-06 LAB — POCT GLYCOSYLATED HEMOGLOBIN (HGB A1C): Hemoglobin A1C: 8.5

## 2016-08-06 MED ORDER — INSULIN GLARGINE 100 UNIT/ML SOLOSTAR PEN: 250.0000 [IU] | PEN_INJECTOR | SUBCUTANEOUS | 11 refills | Status: DC

## 2016-08-06 NOTE — Progress Notes (Addendum)
Subjective:    Patient ID: Cheyenne Guzman, female    DOB: 24-Oct-1965, 51 y.o.   MRN: [id number]  HPI Pt is referred by Dr Sarajane Jews, for hyperthyroidism.  Pt reports she was dx'ed with hyperthyroidism in 2011.  she has never been on therapy for this.  she has never had XRT to the anterior neck, or thyroid surgery.  she has never had thyroid imaging.  she does not consume kelp or any other prescribed or non-prescribed thyroid medication.  she has never been on amiodarone.  She has intermitt palpitations in the chest, but no assoc tremor.   Pt returns for f/u of diabetes mellitus: DM type: Insulin-requiring type 2 Dx'ed: 4696 Complications: polyneuropathy, TIA and retinopathy Therapy: insulin since 2009 GDM: never DKA: never Severe hypoglycemia: never Pancreatitis: never Other: she declines weight loss surgery; she was changed to qd insulin, after poor results with multiple daily injections Interval history: no cbg record, but states cbg's are well-controlled.  She has had 2 steroid injections in the past 6 weeks.  cbg goes up slightly after each dose.  She also takes humalog from a family member, approx 30-50 units per day.  Past Medical History:  Diagnosis Date  . Anxiety    per pt. - mild   . Arthritis    back & ankles   . Asthma    seasonal   . Breast mass in female   . Cancer [facility])    breast, sees Dr. Jana Hakim , Left - 11/2007  . Complication of anesthesia 2013   severe muscle spasms-sore-no doc  . Depression   . Diabetes mellitus    sees Dr. Chalmers Cater, diagnosed 2003  . HA (headache)   . Hearing loss    Went to [facility],Dr [facility]  . Hyperlipidemia   . Kidney infection    - hosp. [facility]- septic- 10/2009  . Low back pain   . Peripheral neuropathy    R sided numbness- face & jaw  . Sleep apnea 9/15   severe-just started cpap-doing well  . Stroke [facility])    tia-no deficit  . TIA (transient ischemic attack)    in 2007,2008, and 2011  . Tinnitus of  right ear   . Urosepsis 08-2009   with Klebsiella    Past Surgical History:  Procedure Laterality Date  . ABDOMINAL HYSTERECTOMY  08/2004  . BREAST BIOPSY  06/22/2011   Procedure: BREAST BIOPSY;  Surgeon: Stark Klein, MD;  Location: [place];  Service: General;  Laterality: Right;  . BREAST SURGERY  11/2007   left ductal carcinoma in situ  . CARPAL TUNNEL RELEASE Right 10/13/2013   Procedure: RIGHT CARPAL TUNNEL RELEASE;  Surgeon: Hessie Dibble, MD;  Location: [place];  Service: Orthopedics;  Laterality: Right;  . CARPAL TUNNEL RELEASE Left 11/24/2013   Procedure: LEFT CARPAL TUNNEL RELEASE WITH CYST EXCISION ;  Surgeon: Hessie Dibble, MD;  Location: [place];  Service: Orthopedics;  Laterality: Left;  . EAR CYST EXCISION Left 11/24/2013   Procedure: CYST REMOVAL;  Surgeon: Hessie Dibble, MD;  Location: [place];  Service: Orthopedics;  Laterality: Left;  . FOOT SURGERY  2000   plantar fasciitis-left  . LUMBAR DISC SURGERY  03-29-14   Fusion, revision 04-27-14 L4-5 Cauda Equina with graft  . NERVE GRAFT  06-01-08   cadaver graft to right inferior alveolar nerve  in [place]    Social History   Social History  .  Marital status: Married    Spouse name: [name]  . Number of children: 2  . Years of education: 14   Occupational History  .      [employer]   Social History Main Topics  . Smoking status: Former Smoker    Quit date: 02/18/1997  . Smokeless tobacco: Never Used  . Alcohol use 0.0 oz/week     Comment: rare  . Drug use: No  . Sexual activity: Not on file   Other Topics Concern  . Not on file   Social History Narrative   Patient lives at home with her husband [name]) and two daughters.    Patient works for [employer] full time patient is on medical leave at this time.   Education two years of college.   Right handed.   Caffeine two times per day.    Current Outpatient Prescriptions on File Prior to  Visit  Medication Sig Dispense Refill  . albuterol (PROVENTIL HFA;VENTOLIN HFA) 108 (90 Base) MCG/ACT inhaler Inhale 2 puffs into the lungs every 4 (four) hours as needed for wheezing or shortness of breath. 1 Inhaler 1  . albuterol (PROVENTIL) (2.5 MG/3ML) 0.083% nebulizer solution 1 VIAL IN NEBULIZER EVERY 4 HOURS AS NEEDED FOR WHEEZING 75 mL 1  . aspirin 325 MG EC tablet Take 325 mg by mouth at bedtime. Reported on 04/19/2015    . furosemide (LASIX) 40 MG tablet TAKE 1 TABLET (40 MG TOTAL) BY MOUTH DAILY. AS NEEDED FOR SWELLING. 30 tablet 6  . methocarbamol (ROBAXIN) 750 MG tablet Take 1 tablet (750 mg total) by mouth 2 (two) times daily. 60 tablet 5  . Oxycodone HCl 10 MG TABS Take 10 mg by mouth 3 (three) times daily.    . sertraline (ZOLOFT) 100 MG tablet Take 2 tablets (200 mg total) by mouth at bedtime. 180 tablet 3  . topiramate (TOPAMAX) 100 MG tablet Take 1 tablet (100 mg total) by mouth 2 (two) times daily. (Patient taking differently: Take 100 mg by mouth 2 (two) times daily. Extended release.) 60 tablet 5  . ALPRAZolam (XANAX) 1 MG tablet TAKE 1 TABLET BY MOUTH 3 TIMES A DAY AS NEEDED FOR ANXIETY 90 tablet 2   No current facility-administered medications on file prior to visit.     No Known Allergies  Family History  Problem Relation Age of Onset  . Cancer Mother        breast  . Cancer Father        lung  . Diabetes Father   . Cancer Sister        colon  . Cancer Maternal Aunt        breast - both  . Diabetes Sister   . Anesthesia problems Neg Hx     BP (!) 142/84   Pulse 94   Ht 5\' 7"  (1.702 m)   Wt (!) 427 lb (193.7 kg)   SpO2 94%   BMI 66.88 kg/m   Review of Systems She has weight gain.      Objective:   Physical Exam VITAL SIGNS:  See vs page GENERAL: no distress NECK: obesity limits exam. There is no palpable thyroid enlargement.  No thyroid nodule is palpable.  No palpable lymphadenopathy at the anterior neck.   Pulses: foot pulses are intact  bilaterally.   MSK: no deformity of the feet or ankles.  CV: 1+ bilat edema of the legs.  Skin:  no ulcer on the feet or ankles.  normal  color and temp on the feet and ankles Neuro: sensation is intact to touch on the feet and ankles.     Chest CT: no mention is made of a goiter.      Assessment & Plan:  Insulin-requiring type 2 DM, with DR: she needs increased rx.  Hypothyroidism, new to me.  Better on recheck, but she is at high risk for recurrence.  HTN: recheck next time.  Patient Instructions  Your thyroid needs no treatment now.  However, with time, it will go underactive.  Please increase the insulin to 250 units each morning. This will reduce the need for humalog.   check your blood sugar twice a day.  vary the time of day when you check, between before the 3 meals, and at bedtime.  also check if you have symptoms of your blood sugar being too high or too low.  please keep a record of the readings and bring it to your next appointment here (or you can bring the meter itself).  You can write it on any piece of paper.  please call us sooner if your blood sugar goes below 70, or if you have a lot of readings over 200. Please come back for a follow-up appointment in 3 months.

## 2016-08-06 NOTE — Patient Instructions (Addendum)
Your thyroid needs no treatment now.  However, with time, it will go underactive.  Please increase the insulin to 250 units each morning. This will reduce the need for humalog.   check your blood sugar twice a day.  vary the time of day when you check, between before the 3 meals, and at bedtime.  also check if you have symptoms of your blood sugar being too high or too low.  please keep a record of the readings and bring it to your next appointment here (or you can bring the meter itself).  You can write it on any piece of paper.  please call us sooner if your blood sugar goes below 70, or if you have a lot of readings over 200. Please come back for a follow-up appointment in 3 months.

## 2016-08-06 NOTE — Telephone Encounter (Signed)
Call in #[phone] with 2 rf 

## 2016-08-18 ENCOUNTER — Other Ambulatory Visit: Payer: Self-pay | Admitting: Endocrinology

## 2016-08-24 ENCOUNTER — Other Ambulatory Visit: Payer: Self-pay

## 2016-08-24 ENCOUNTER — Telehealth: Payer: Self-pay | Admitting: Endocrinology

## 2016-08-24 ENCOUNTER — Telehealth: Payer: Self-pay | Admitting: Family Medicine

## 2016-08-24 MED ORDER — GLUCOSE BLOOD VI STRP: ORAL_STRIP | 0 refills | Status: AC

## 2016-08-24 NOTE — Telephone Encounter (Signed)
error 

## 2016-08-24 NOTE — Telephone Encounter (Signed)
**  Remind patient they can make refill requests via MyChart**  Medication refill request (Name & Dosage):  Test Strips for One Touch Ultra  Preferred pharmacy ([facility] & [facility] [place]  Other comments (if applicable):

## 2016-08-27 ENCOUNTER — Other Ambulatory Visit: Payer: Self-pay

## 2016-08-27 MED ORDER — CONTOUR NEXT MONITOR W/DEVICE KIT: 1.0000 | PACK | Freq: Two times a day (BID) | 0 refills | Status: AC

## 2016-08-27 MED ORDER — GLUCOSE BLOOD VI STRP: ORAL_STRIP | 12 refills | Status: DC

## 2016-08-30 ENCOUNTER — Other Ambulatory Visit: Payer: Self-pay

## 2016-08-30 ENCOUNTER — Telehealth: Payer: Self-pay | Admitting: Endocrinology

## 2016-08-30 MED ORDER — GLUCOSE BLOOD VI STRP: ORAL_STRIP | 12 refills | Status: DC

## 2016-08-30 NOTE — Telephone Encounter (Signed)
Routing to you °

## 2016-08-30 NOTE — Telephone Encounter (Signed)
Called patient and resent in prescription.

## 2016-08-30 NOTE — Telephone Encounter (Signed)
MEDICATION: glucose blood (CONTOUR NEXT TEST) test strip  PHARMACY:    [id number] [place], [place] - [address] [phone] (Phone) [phone] (Fax)     IS THIS A 90 DAY SUPPLY :   IS PATIENT OUT OF MEDICTAION:   IF NOT; HOW MUCH IS LEFT:   LAST APPOINTMENT DATE: 08/06/16  NEXT APPOINTMENT DATE: 11/06/16  OTHER COMMENTS:    **Let patient know to contact pharmacy at the end of the day to make sure medication is ready. **  ** Please notify patient to allow 48-72 hours to process**  **Encourage patient to contact the pharmacy for refills or they can request refills through [facility]**

## 2016-09-06 ENCOUNTER — Telehealth: Payer: Self-pay | Admitting: Endocrinology

## 2016-09-06 ENCOUNTER — Other Ambulatory Visit: Payer: Self-pay

## 2016-09-06 MED ORDER — INSULIN GLARGINE 100 UNIT/ML SOLOSTAR PEN: 250.0000 [IU] | PEN_INJECTOR | SUBCUTANEOUS | 11 refills | Status: DC

## 2016-09-06 NOTE — Telephone Encounter (Signed)
Routing to you °

## 2016-09-06 NOTE — Telephone Encounter (Signed)
Called patient and advised that RX was submitted to pharmacy for correct dose.

## 2016-09-06 NOTE — Telephone Encounter (Signed)
Patient called in reference to LANTUS SOLOSTAR 100 UNIT/ML Solostar Pen being increased to 250 units but Rx was sent in for 150 units. Please call patient and advise. Ok to leave message.

## 2016-09-10 ENCOUNTER — Emergency Department (HOSPITAL_BASED_OUTPATIENT_CLINIC_OR_DEPARTMENT_OTHER): Payer: BLUE CROSS/BLUE SHIELD

## 2016-09-10 ENCOUNTER — Encounter (HOSPITAL_BASED_OUTPATIENT_CLINIC_OR_DEPARTMENT_OTHER): Payer: Self-pay

## 2016-09-10 ENCOUNTER — Emergency Department (HOSPITAL_BASED_OUTPATIENT_CLINIC_OR_DEPARTMENT_OTHER)
Admission: EM | Admit: 2016-09-10 | Discharge: 2016-09-10 | Disposition: A | Discharge status: Home / Self Care | Payer: BLUE CROSS/BLUE SHIELD | Attending: Emergency Medicine | Admitting: Emergency Medicine

## 2016-09-10 ENCOUNTER — Telehealth: Payer: Self-pay | Admitting: Family Medicine

## 2016-09-10 DIAGNOSIS — Y929 Unspecified place or not applicable: Secondary | ICD-10-CM | POA: Insufficient documentation

## 2016-09-10 DIAGNOSIS — Y939 Activity, unspecified: Secondary | ICD-10-CM | POA: Insufficient documentation

## 2016-09-10 DIAGNOSIS — Z8673 Personal history of transient ischemic attack (TIA), and cerebral infarction without residual deficits: Secondary | ICD-10-CM | POA: Insufficient documentation

## 2016-09-10 DIAGNOSIS — E1140 Type 2 diabetes mellitus with diabetic neuropathy, unspecified: Secondary | ICD-10-CM | POA: Diagnosis not present

## 2016-09-10 DIAGNOSIS — Z87891 Personal history of nicotine dependence: Secondary | ICD-10-CM | POA: Insufficient documentation

## 2016-09-10 DIAGNOSIS — R0602 Shortness of breath: Secondary | ICD-10-CM | POA: Diagnosis not present

## 2016-09-10 DIAGNOSIS — J45909 Unspecified asthma, uncomplicated: Secondary | ICD-10-CM | POA: Diagnosis not present

## 2016-09-10 DIAGNOSIS — Z7982 Long term (current) use of aspirin: Secondary | ICD-10-CM | POA: Insufficient documentation

## 2016-09-10 DIAGNOSIS — M79672 Pain in left foot: Secondary | ICD-10-CM | POA: Diagnosis present

## 2016-09-10 DIAGNOSIS — Z794 Long term (current) use of insulin: Secondary | ICD-10-CM | POA: Insufficient documentation

## 2016-09-10 DIAGNOSIS — Y999 Unspecified external cause status: Secondary | ICD-10-CM | POA: Diagnosis not present

## 2016-09-10 DIAGNOSIS — X58XXXA Exposure to other specified factors, initial encounter: Secondary | ICD-10-CM | POA: Insufficient documentation

## 2016-09-10 DIAGNOSIS — Z79899 Other long term (current) drug therapy: Secondary | ICD-10-CM | POA: Insufficient documentation

## 2016-09-10 DIAGNOSIS — S90122A Contusion of left lesser toe(s) without damage to nail, initial encounter: Secondary | ICD-10-CM | POA: Diagnosis not present

## 2016-09-10 LAB — BASIC METABOLIC PANEL
Anion gap: 10 (ref 5–15)
BUN: 20 mg/dL (ref 6–20)
CALCIUM: 9.1 mg/dL (ref 8.9–10.3)
CO2: 25 mmol/L (ref 22–32)
Chloride: 101 mmol/L (ref 101–111)
Creatinine, Ser: 0.92 mg/dL (ref 0.44–1.00)
GFR calc Af Amer: >60 mL/min (ref >60)
GLUCOSE: 230 mg/dL — AB (ref 65–99)
POTASSIUM: 4.2 mmol/L (ref 3.5–5.1)
SODIUM: 136 mmol/L (ref 135–145)

## 2016-09-10 LAB — CBC WITH DIFFERENTIAL/PLATELET
Basophils Absolute: 0 10*3/uL (ref 0.0–0.1)
Basophils Relative: 0 %
EOS ABS: 0.2 10*3/uL (ref 0.0–0.7)
Eosinophils Relative: 2 %
HCT: 41.5 % (ref 36.0–46.0)
HEMOGLOBIN: 13.3 g/dL (ref 12.0–15.0)
LYMPHS ABS: 1.7 10*3/uL (ref 0.7–4.0)
LYMPHS PCT: 23 %
MCH: 25.6 pg — AB (ref 26.0–34.0)
MCHC: 32 g/dL (ref 30.0–36.0)
MCV: 80 fL (ref 78.0–100.0)
Monocytes Absolute: 0.6 10*3/uL (ref 0.1–1.0)
Monocytes Relative: 9 %
NEUTROS PCT: 66 %
Neutro Abs: 4.7 10*3/uL (ref 1.7–7.7)
Platelets: 183 10*3/uL (ref 150–400)
RBC: 5.19 MIL/uL — AB (ref 3.87–5.11)
RDW: 16.5 % — ABNORMAL HIGH (ref 11.5–15.5)
WBC: 7.1 10*3/uL (ref 4.0–10.5)

## 2016-09-10 LAB — BRAIN NATRIURETIC PEPTIDE: B NATRIURETIC PEPTIDE 5: 15.5 pg/mL (ref 0.0–100.0)

## 2016-09-10 LAB — TROPONIN I: Troponin I: <0.03 ng/mL (ref <0.03)

## 2016-09-10 NOTE — Telephone Encounter (Signed)
Dr. Sarajane Jews   Please advise on previous message

## 2016-09-10 NOTE — Discharge Instructions (Signed)
You likely have a small fracture on the toe due to your neuropathy.   Please observe for any signs of infection such as worse redness or swelling or pain or fever.   You need to take your neurontin as prescribed.   See orthopedic doctor for follow up in a week   Return to ER if you have worse toe pain or swelling or redness, fevers, chest pain, trouble breathing.

## 2016-09-10 NOTE — Telephone Encounter (Signed)
She should come in tomorrow for [place] to take a look at it

## 2016-09-10 NOTE — ED Triage Notes (Signed)
Pt c/o left middle toe turned purple today-denies injury-states hx of neuropathy with little feeling but states area "feels weird-like more numb"

## 2016-09-10 NOTE — ED Provider Notes (Signed)
[facility] DEPT [facility] Provider Note   CSN: [id number] Arrival date & time: 09/10/16  1858  By signing my name below, I, Marcello Moores, attest that this documentation has been prepared under the direction and in the presence of Drenda Freeze, MD. Electronically Signed: Marcello Moores, ED Scribe. 09/10/16. 8:22 PM.  History   Chief Complaint Chief Complaint  Patient presents with  . Foot Pain   The history is provided by the patient. No language interpreter was used.   HPI Comments: Cheyenne Guzman is a 51 y.o. female with a h/x of DM, who presents to the Emergency Department complaining of a sudden onset of unchanged left middle toe numbness and color change that began today. She has subjective shortness of breath as well. She denies fall and  recent injury. The pt has a h/x of neuropathy from diabetes and told me that her blood sugar is around 130-180 usually. The pt has a FMHx of DVT and is concerned about that her symptoms are from a DVT.  She denies chest pain.   Past Medical History:  Diagnosis Date  . Anxiety    per pt. - mild   . Arthritis    back & ankles   . Asthma    seasonal   . Breast mass in female   . Cancer [facility])    breast, sees Dr. Jana Hakim , Left - 11/2007  . Complication of anesthesia 2013   severe muscle spasms-sore-no doc  . Depression   . Diabetes mellitus    sees Dr. Chalmers Cater, diagnosed 2003  . HA (headache)   . Hearing loss    Went to [facility],Dr [facility]  . Hyperlipidemia   . Kidney infection    - hosp. [facility]- septic- 10/2009  . Low back pain   . Peripheral neuropathy    R sided numbness- face & jaw  . Sleep apnea 9/15   severe-just started cpap-doing well  . Stroke [facility])    tia-no deficit  . TIA (transient ischemic attack)    in 2007,2008, and 2011  . Tinnitus of right ear   . Urosepsis 08-2009   with Klebsiella    Patient Active Problem List   Diagnosis Date Noted  . Edema of both legs 10/18/2015  .  Migraine 06/16/2014  . Lumbar radiculopathy 03/18/2014  . Diabetic neuropathy ([place]) 01/07/2014  . Congenital spondylolysis of lumbosacral region 11/02/2013  . SPL (spondylolisthesis) 10/08/2013  . Chronic LBP 10/08/2013  . Obstructive sleep apnea 07/31/2013  . HA (headache)   . Tinnitus 06/23/2013  . Hearing loss 06/23/2013  . Headache(784.0) 06/23/2013  . Low back pain 06/10/2013  . SOB (shortness of breath) 11/27/2012  . Chest pain 11/27/2012  . Cellulitis of left leg 07/03/2012  . Morbid obesity ([place]) 07/03/2012  . Abdominal pain 02/01/2012  . Breast mass, right 07/13/2011  . DCIS (ductal carcinoma in situ) of breast, left, treated with [facility] 11/2007 02/19/2011  . TIA 10/13/2009  . BACK PAIN, LUMBAR 10/13/2009  . PYELONEPHRITIS 09/09/2009  . Urinary tract infection, site not specified 08/16/2008  . ASTHMA 02/20/2008  . CANDIDIASIS 08/28/2007  . Diabetes mellitus without complication ([place]) 34/91/7915  . HYPERLIPIDEMIA 11/26/2006  . ANXIETY 11/26/2006  . DEPRESSION 11/26/2006  . ACUTE BRONCHITIS 11/26/2006    Past Surgical History:  Procedure Laterality Date  . ABDOMINAL HYSTERECTOMY  08/2004  . BREAST BIOPSY  06/22/2011   Procedure: BREAST BIOPSY;  Surgeon: Stark Klein, MD;  Location: [place];  Service:  General;  Laterality: Right;  . BREAST SURGERY  11/2007   left ductal carcinoma in situ  . CARPAL TUNNEL RELEASE Right 10/13/2013   Procedure: RIGHT CARPAL TUNNEL RELEASE;  Surgeon: Hessie Dibble, MD;  Location: [place];  Service: Orthopedics;  Laterality: Right;  . CARPAL TUNNEL RELEASE Left 11/24/2013   Procedure: LEFT CARPAL TUNNEL RELEASE WITH CYST EXCISION ;  Surgeon: Hessie Dibble, MD;  Location: [place];  Service: Orthopedics;  Laterality: Left;  . EAR CYST EXCISION Left 11/24/2013   Procedure: CYST REMOVAL;  Surgeon: Hessie Dibble, MD;  Location: [place];  Service: Orthopedics;  Laterality: Left;  . FOOT  SURGERY  2000   plantar fasciitis-left  . LUMBAR DISC SURGERY  03-29-14   Fusion, revision 04-27-14 L4-5 [place] with graft  . NERVE GRAFT  06-01-08   cadaver graft to right inferior alveolar nerve  in [place]    OB History    No data available       Home Medications    Prior to Admission medications   Medication Sig Start Date End Date Taking? Authorizing Provider  albuterol (PROVENTIL HFA;VENTOLIN HFA) 108 (90 Base) MCG/ACT inhaler Inhale 2 puffs into the lungs every 4 (four) hours as needed for wheezing or shortness of breath. 10/14/15   Laurey Morale, MD  albuterol (PROVENTIL) (2.5 MG/3ML) 0.083% nebulizer solution 1 VIAL IN NEBULIZER EVERY 4 HOURS AS NEEDED FOR WHEEZING 10/13/13   Laurey Morale, MD  ALPRAZolam Duanne Moron) 1 MG tablet TAKE 1 TABLET BY MOUTH 3 TIMES A DAY AS NEEDED FOR ANXIETY 08/06/16   Laurey Morale, MD  aspirin 325 MG EC tablet Take 325 mg by mouth at bedtime. Reported on 04/19/2015    [provider]  Blood Glucose Monitoring Suppl (CONTOUR NEXT MONITOR) w/Device KIT 1 each by Does not apply route 2 (two) times daily. To check blood sugars twice a day. 08/27/16   Renato Shin, MD  furosemide (LASIX) 40 MG tablet TAKE 1 TABLET (40 MG TOTAL) BY MOUTH DAILY. AS NEEDED FOR SWELLING. 02/28/16   Laurey Morale, MD  gabapentin (NEURONTIN) 300 MG capsule Take by mouth.    [provider]  glucose blood (CONTOUR NEXT TEST) test strip Use as instructed 08/30/16   Renato Shin, MD  glucose blood test strip Use as instructed 08/24/16   Renato Shin, MD  Insulin Glargine (LANTUS SOLOSTAR) 100 UNIT/ML Solostar Pen Inject 250 Units into the skin every morning. and pen needles 3/day 09/06/16   Philemon Kingdom, MD  LANTUS SOLOSTAR 100 UNIT/ML Solostar Pen INJECT 150 UNITS INTO THE SKIN EVERY MORNING. AND PEN NEEDLES 2/DAY 08/18/16   Renato Shin, MD  methocarbamol (ROBAXIN) 750 MG tablet Take 1 tablet (750 mg total) by mouth 2 (two) times daily. 02/03/15   Laurey Morale, MD  Oxycodone HCl 10 MG TABS Take 10 mg by mouth 3 (three) times daily.    [provider]  sertraline (ZOLOFT) 100 MG tablet Take 2 tablets (200 mg total) by mouth at bedtime. 05/10/16   Laurey Morale, MD  topiramate (TOPAMAX) 100 MG tablet Take 1 tablet (100 mg total) by mouth 2 (two) times daily. Patient taking differently: Take 100 mg by mouth 2 (two) times daily. Extended release. 02/03/15   Laurey Morale, MD    Family History Family History  Problem Relation Age of Onset  . Cancer Mother        breast  .  Cancer Father        lung  . Diabetes Father   . Cancer Sister        colon  . Cancer Maternal Aunt        breast - both  . Diabetes Sister   . Anesthesia problems Neg Hx     Social History Social History  Substance Use Topics  . Smoking status: Former Smoker    Quit date: 02/18/1997  . Smokeless tobacco: Never Used  . Alcohol use Yes     Comment: rare     Allergies   Patient has no known allergies.   Review of Systems Review of Systems  Respiratory: Positive for shortness of breath (dyspnea on exertion).   Cardiovascular: Negative for chest pain.  Skin: Positive for color change (purple).  All other systems reviewed and are negative.    Physical Exam Updated Vital Signs BP (!) 154/79 (BP Location: Left Wrist)   Pulse 89   Temp 99.2 F (37.3 C) (Oral)   Resp 20   Ht _0  (1.702 m)   Wt (!) 415 lb (188.2 kg)   SpO2 97%   BMI 65.00 kg/m   Physical Exam  Constitutional: She is oriented to person, place, and time. She appears well-developed and well-nourished.  HENT:  Head: Normocephalic.  Eyes: EOM are normal.  Neck: Normal range of motion.  Cardiovascular: Normal rate and regular rhythm.   Pulses:      Dorsalis pedis pulses are 2+ on the right side, and 2+ on the left side.  Pulmonary/Chest: Effort normal.  Abdominal: She exhibits no distension.  Musculoskeletal: Normal range of motion.  L foot 3rd toe with some  ecchymosis and swelling. No obvious cellulitis or gangrene. No surrounding erythema. Difficult to assess capillary on all toes. 2+ DP pulses. No obvious calf tenderness   Neurological: She is alert and oriented to person, place, and time.  Skin:  Left third toe is slightly purple.  Psychiatric: She has a normal mood and affect.  Nursing note and vitals reviewed.    ED Treatments / Results   DIAGNOSTIC STUDIES: Oxygen Saturation is 97% on RA, normal by my interpretation.   COORDINATION OF CARE: 8:15 PM-Discussed next steps with pt. Pt verbalized understanding and is agreeable with the plan.   Labs (all labs ordered are listed, but only abnormal results are displayed) Labs Reviewed  CBC WITH DIFFERENTIAL/PLATELET  BASIC METABOLIC PANEL    EKG  EKG Interpretation None       Radiology No results found.  Procedures Procedures (including critical care time)  Medications Ordered in ED Medications - No data to display   Initial Impression / Assessment and Plan / ED Course  I have reviewed the triage vital signs and the nursing notes.  Pertinent labs & imaging results that were available during my care of the patient were reviewed by me and considered in my medical decision making (see chart for details).     Cheyenne Guzman is a 51 y.o. female here with l third toe ecchymosis. She has neuropathy and has minimal sensation to L foot at baseline. I suspect that she may have underlying fracture from unknowingly hitting her toe when she walks. She is very concerned for DVT given family hx. She has subjective shortness of breath but not tachycardic and no pleuritic chest pain. I told that we should start with DVT study, xrays of the toe and labs.   11:01 PM Labs showed nl WBC count. DVT  study neg. xrays showed arthritis L third toe. I suspect small underlying fracture. No signs of gangrene or super imposed infection. I recommend ortho follow up.    Final Clinical  Impressions(s) / ED Diagnoses   Final diagnoses:  None    New Prescriptions New Prescriptions   No medications on file   I personally performed the services described in this documentation, which was scribed in my presence. The recorded information has been reviewed and is accurate.    Drenda Freeze, MD 09/10/16 [phone]

## 2016-09-10 NOTE — ED Notes (Signed)
Patient transported to X-ray 

## 2016-09-10 NOTE — Telephone Encounter (Signed)
Pt calling stating her middle toe on L foot is numb and purple denies bumping it and is a diabetic.  Pt would like to know what she should do about it.

## 2016-09-10 NOTE — Telephone Encounter (Signed)
Spoke with pt and she agreed to appt and she will be in tomorrow for him to look. Nothing further is needed

## 2016-09-11 ENCOUNTER — Ambulatory Visit: Payer: BLUE CROSS/BLUE SHIELD | Admitting: Family Medicine

## 2016-09-25 ENCOUNTER — Encounter: Payer: Self-pay | Admitting: Endocrinology

## 2016-09-27 ENCOUNTER — Other Ambulatory Visit: Payer: Self-pay

## 2016-09-27 MED ORDER — INSULIN GLARGINE 100 UNIT/ML SOLOSTAR PEN: 250.0000 [IU] | PEN_INJECTOR | SUBCUTANEOUS | 11 refills | Status: DC

## 2016-10-11 ENCOUNTER — Encounter: Payer: Self-pay | Admitting: Family Medicine

## 2016-11-06 ENCOUNTER — Ambulatory Visit: Payer: BLUE CROSS/BLUE SHIELD | Admitting: Endocrinology

## 2016-11-11 ENCOUNTER — Other Ambulatory Visit: Payer: Self-pay | Admitting: Family Medicine

## 2016-11-12 ENCOUNTER — Ambulatory Visit: Payer: BLUE CROSS/BLUE SHIELD | Admitting: Endocrinology

## 2016-11-12 DIAGNOSIS — Z0289 Encounter for other administrative examinations: Secondary | ICD-10-CM

## 2016-11-12 NOTE — Telephone Encounter (Signed)
RX was phoned in to pt pharmacy # [phone] with 5 refills

## 2016-11-12 NOTE — Telephone Encounter (Signed)
Call in #90 with 5 rf 

## 2016-11-12 NOTE — Telephone Encounter (Signed)
Please Advise

## 2016-12-18 ENCOUNTER — Other Ambulatory Visit: Payer: Self-pay

## 2016-12-18 MED ORDER — GLUCOSE BLOOD VI STRP: ORAL_STRIP | 12 refills | Status: AC

## 2017-01-28 ENCOUNTER — Telehealth: Payer: Self-pay | Admitting: *Deleted

## 2017-01-28 NOTE — Telephone Encounter (Signed)
Patient Name: Cheyenne Guzman Gender: Female DOB: 18-May-1965 Age: 52 Y 71 M 30 D Return Phone Number: [phone] (Primary), [phone] (Secondary) Address: City/State/Zip: [place] [address] Client [facility] Night - Client Client Site [facility] - Night Physician [name] - MD Contact Type Call Who Is Calling Patient / Member / Family / Caregiver Call Type Triage / Clinical Relationship To Patient Self Return Phone Number [phone] (Primary) Chief Complaint Fever (non urgent symptom) (> THREE MONTHS) Reason for Call Symptomatic / Request for Health Information Initial Comment Caller states c/o fever of 99.0 (usually runs 97.0 normally) and urine has a strong odor. She has a history of sepsis from UTI so she would like to get an Rx antibiotic called in. Translation No Nurse Assessment Nurse: Louie Boston, RN, Barnetta Chapel Date/Time (Eastern Time): 01/27/2017 1:10:44 PM Confirm and document reason for call. If symptomatic, describe symptoms. ---caller states oral temp 99 and urine has a strong odor and a little clowdy. hx of urosepsis. several times over last yr or two had UTIs. Last UTI was maybe last april. oral Temp now is 100. lost bladder control due to back issues-has incontinence. Some burning with urination, low back pain, overall achiness and some frequency. Pt would like Dr to call in an antibiotic. Does the patient have any new or worsening symptoms? ---Yes Will a triage be completed? ---Yes Related visit to physician within the last 2 weeks? ---No Does the PT have any chronic conditions? (i.e. diabetes, asthma, etc.) ---Yes List chronic conditions. ---chronic back issues-spinal stenosis and history of spinal fusion, urinary incontinence, frequent UTIs, diabetes type 2, takes insulin, gabapentin, methocarbamol, zoloft, ASA, oxycodone Is the patient pregnant or possibly pregnant? (Ask all females between the ages of 28-55)  ---No Is this a behavioral health or substance abuse call? ---No Guidelines Guideline Title Affirmed Question Affirmed Notes Nurse Date/Time Eilene Ghazi Time) Urination Pain - Female Side (flank) or lower back pain present Louie Boston, RN, Barnetta Chapel 01/27/2017 1:18:45 PM PLEASE NOTE: All timestamps contained within this report are represented as [place] Standard Time. CONFIDENTIALTY NOTICE: This fax transmission is intended only for the addressee. It contains information that is legally privileged, confidential or otherwise protected from use or disclosure. If you are not the intended recipient, you are strictly prohibited from reviewing, disclosing, copying using or disseminating any of this information or taking any action in reliance on or regarding this information. If you have received this fax in error, please notify us immediately by telephone so that we can arrange for its return to [place]. Phone: [phone], Toll-Free: [phone], Fax: [phone] Page: 2 of 2 Call Id: [id number] [place]. Time Eilene Ghazi Time) Disposition Final User 01/27/2017 1:22:09 PM See Physician within 4 Hours (or PCP triage) Yes Louie Boston, RN, Barnetta Chapel

## 2017-01-28 NOTE — Telephone Encounter (Signed)
Patient reports she is not running a fever today and found a whole bottle of unexpired antibiotics Dr. Sarajane Jews prescribed last year for UTI from when her antibiotics were switched after a urine culture. She started taking this last night and this morning and already feels "100% better." Advised patient against taking leftover medications or medications not prescribed for this issue, but she states she has frequent UTIs and feels comfortable with her decision.

## 2017-02-21 ENCOUNTER — Ambulatory Visit: Payer: BLUE CROSS/BLUE SHIELD | Admitting: Family Medicine

## 2017-02-21 DIAGNOSIS — Z0289 Encounter for other administrative examinations: Secondary | ICD-10-CM

## 2017-04-03 ENCOUNTER — Encounter: Payer: Self-pay | Admitting: Family Medicine

## 2017-04-03 ENCOUNTER — Ambulatory Visit: Payer: BLUE CROSS/BLUE SHIELD | Admitting: Family Medicine

## 2017-04-03 VITALS — BP 158/82 | HR 100 | Temp 99.3°F

## 2017-04-03 DIAGNOSIS — J018 Other acute sinusitis: Secondary | ICD-10-CM | POA: Diagnosis not present

## 2017-04-03 DIAGNOSIS — N390 Urinary tract infection, site not specified: Secondary | ICD-10-CM | POA: Diagnosis not present

## 2017-04-03 DIAGNOSIS — R300 Dysuria: Secondary | ICD-10-CM

## 2017-04-03 LAB — POCT URINALYSIS DIPSTICK
GLUCOSE UA: NEGATIVE
KETONES UA: NEGATIVE
Nitrite, UA: POSITIVE
Protein, UA: 3
Urobilinogen, UA: 0.2 E.U./dL
pH, UA: 6 (ref 5.0–8.0)

## 2017-04-03 MED ORDER — ALBUTEROL SULFATE HFA 108 (90 BASE) MCG/ACT IN AERS: 2.0000 | INHALATION_SPRAY | RESPIRATORY_TRACT | 11 refills | Status: DC | PRN

## 2017-04-03 MED ORDER — CEFTRIAXONE SODIUM 1 G IJ SOLR
1.0000 g | Freq: Once | INTRAMUSCULAR | Status: AC
  Administered 2017-04-03: 1 g via INTRAMUSCULAR

## 2017-04-03 MED ORDER — LEVOFLOXACIN 500 MG PO TABS: 500.0000 mg | ORAL_TABLET | Freq: Every day | ORAL | 0 refills | Status: AC

## 2017-04-03 NOTE — Addendum Note (Signed)
Addended by: Myriam Forehand on: 04/03/2017 05:37 PM   Modules accepted: Orders

## 2017-04-03 NOTE — Progress Notes (Signed)
   Subjective:    Patient ID: Cheyenne Guzman, female    DOB: 03-Oct-1965, 52 y.o.   MRN: [id number]  HPI Here for 2 issues. First 2 weeks ago she developed sinus pressure, facial pain, PND and coughing up yellow sputum. No fever. Then 3 days ago she began to have urinary urgency and burning.    Review of Systems  Constitutional: Negative.   HENT: Positive for congestion, postnasal drip, sinus pressure and sinus pain. Negative for sore throat.   Eyes: Negative.   Respiratory: Positive for cough.   Cardiovascular: Negative.   Gastrointestinal: Negative.   Genitourinary: Positive for dysuria, frequency and urgency. Negative for flank pain and hematuria.       Objective:   Physical Exam  Constitutional: She appears well-developed and well-nourished.  HENT:  Right Ear: External ear normal.  Left Ear: External ear normal.  Nose: Nose normal.  Mouth/Throat: Oropharynx is clear and moist.  Eyes: Conjunctivae are normal.  Neck: No thyromegaly present.  Pulmonary/Chest: Effort normal and breath sounds normal. No respiratory distress. She has no wheezes. She has no rales.  Abdominal: Soft. Bowel sounds are normal. She exhibits no distension and no mass. There is no tenderness. There is no rebound and no guarding.  Lymphadenopathy:    She has no cervical adenopathy.          Assessment & Plan:  She has a sinusitis and a UTI. Treat both with Levaquin for 10 days. Given a Rocephin shot. Culture the urine sample. Drink plenty of water.  [name], MD  .

## 2017-04-05 LAB — URINE CULTURE
MICRO NUMBER:: 90319702
SPECIMEN QUALITY:: ADEQUATE

## 2017-04-18 ENCOUNTER — Encounter: Payer: Self-pay | Admitting: Family Medicine

## 2017-04-19 ENCOUNTER — Other Ambulatory Visit: Payer: Self-pay

## 2017-04-19 MED ORDER — SULFAMETHOXAZOLE-TRIMETHOPRIM 800-160 MG PO TABS: 1.0000 | ORAL_TABLET | Freq: Two times a day (BID) | ORAL | 0 refills | Status: DC

## 2017-04-19 NOTE — Telephone Encounter (Signed)
Call in Bactrim DS bid for 10 days

## 2017-04-19 NOTE — Telephone Encounter (Signed)
Call in Bactrim DS bid for 10 days   Rx has been sent. Pt advised that this has been done through [employer].

## 2017-05-15 ENCOUNTER — Other Ambulatory Visit: Payer: Self-pay | Admitting: Family Medicine

## 2017-05-22 ENCOUNTER — Encounter: Payer: Self-pay | Admitting: Family Medicine

## 2017-05-22 ENCOUNTER — Ambulatory Visit: Payer: BLUE CROSS/BLUE SHIELD | Admitting: Family Medicine

## 2017-05-22 VITALS — BP 134/76 | HR 76 | Temp 98.8°F | Ht 67.0 in

## 2017-05-22 DIAGNOSIS — M2550 Pain in unspecified joint: Secondary | ICD-10-CM | POA: Diagnosis not present

## 2017-05-22 LAB — C-REACTIVE PROTEIN: CRP: 4.1 mg/dL (ref 0.5–20.0)

## 2017-05-22 LAB — CBC WITH DIFFERENTIAL/PLATELET
BASOS ABS: 0 10*3/uL (ref 0.0–0.1)
BASOS PCT: 0.7 % (ref 0.0–3.0)
EOS ABS: 0.2 10*3/uL (ref 0.0–0.7)
Eosinophils Relative: 2.6 % (ref 0.0–5.0)
HCT: 42.4 % (ref 36.0–46.0)
Hemoglobin: 13.8 g/dL (ref 12.0–15.0)
LYMPHS PCT: 22.8 % (ref 12.0–46.0)
Lymphs Abs: 1.4 10*3/uL (ref 0.7–4.0)
MCHC: 32.5 g/dL (ref 30.0–36.0)
MCV: 77.7 fl — ABNORMAL LOW (ref 78.0–100.0)
MONO ABS: 0.4 10*3/uL (ref 0.1–1.0)
Monocytes Relative: 6.7 % (ref 3.0–12.0)
NEUTROS ABS: 4 10*3/uL (ref 1.4–7.7)
Neutrophils Relative %: 67.2 % (ref 43.0–77.0)
PLATELETS: 179 10*3/uL (ref 150.0–400.0)
RBC: 5.45 Mil/uL — ABNORMAL HIGH (ref 3.87–5.11)
RDW: 17.7 % — AB (ref 11.5–15.5)
WBC: 6 10*3/uL (ref 4.0–10.5)

## 2017-05-22 LAB — HEPATIC FUNCTION PANEL
ALBUMIN: 4.1 g/dL (ref 3.5–5.2)
ALK PHOS: 103 U/L (ref 39–117)
ALT: 13 U/L (ref 0–35)
AST: 16 U/L (ref 0–37)
BILIRUBIN DIRECT: 0.1 mg/dL (ref 0.0–0.3)
TOTAL PROTEIN: 7.3 g/dL (ref 6.0–8.3)
Total Bilirubin: 0.6 mg/dL (ref 0.2–1.2)

## 2017-05-22 LAB — BASIC METABOLIC PANEL
BUN: 12 mg/dL (ref 6–23)
CALCIUM: 9.1 mg/dL (ref 8.4–10.5)
CHLORIDE: 100 meq/L (ref 96–112)
CO2: 27 meq/L (ref 19–32)
CREATININE: 0.87 mg/dL (ref 0.40–1.20)
GFR: 72.54 mL/min (ref >60.00)
GLUCOSE: 232 mg/dL — AB (ref 70–99)
Potassium: 4.6 mEq/L (ref 3.5–5.1)
Sodium: 136 mEq/L (ref 135–145)

## 2017-05-22 LAB — SEDIMENTATION RATE: SED RATE: 81 mm/h — AB (ref 0–30)

## 2017-05-22 LAB — TSH: TSH: 5.18 u[IU]/mL — ABNORMAL HIGH (ref 0.35–4.50)

## 2017-05-22 NOTE — Progress Notes (Signed)
   Subjective:    Patient ID: Cheyenne Guzman, female    DOB: 04/30/1965, 52 y.o.   MRN: [id number]  HPI Here for 2 weeks of the sudden onset of diffuse swelling in her joints and pain and stiffness in the joints. Her hands have been the most affected. She feels hot and sweaty at times, but her temperature has been normal.    Review of Systems  Constitutional: Positive for chills and diaphoresis. Negative for fever.  Respiratory: Negative.   Cardiovascular: Negative.   Gastrointestinal: Negative.   Genitourinary: Negative.   Musculoskeletal: Positive for arthralgias and joint swelling.  Neurological: Negative.        Objective:   Physical Exam  Constitutional: She is oriented to person, place, and time. She appears well-developed and well-nourished.  Neck: No thyromegaly present.  Cardiovascular: Normal rate, regular rhythm, normal heart sounds and intact distal pulses.  Pulmonary/Chest: Effort normal and breath sounds normal. No stridor. No respiratory distress. She has no wheezes. She has no rales.  Musculoskeletal:  She is tender and slightly puffy in all her PIP joints and MCP joints. The wrists are not involved. No erythema or warmth.   Lymphadenopathy:    She has no cervical adenopathy.  Neurological: She is alert and oriented to person, place, and time.          Assessment & Plan:  She has symptoms of some sort of inflammatory reaction. She can take Ibuprofen for relief. We will get labs today to look for rheumatoid arthritis, etc.  [name], MD

## 2017-05-23 LAB — RHEUMATOID FACTOR: RHEUMATOID FACTOR: 69 [IU]/mL — AB (ref <14)

## 2017-05-24 LAB — ANA: Anti Nuclear Antibody(ANA): NEGATIVE

## 2017-05-27 NOTE — Addendum Note (Signed)
Addended by: [name] A on: 05/27/2017 01:02 PM   Modules accepted: Orders

## 2017-05-28 ENCOUNTER — Other Ambulatory Visit: Payer: Self-pay

## 2017-05-28 ENCOUNTER — Encounter: Payer: Self-pay | Admitting: Family Medicine

## 2017-05-28 MED ORDER — LEVOTHYROXINE SODIUM 75 MCG PO TABS: 75.0000 ug | ORAL_TABLET | Freq: Every day | ORAL | 3 refills | Status: DC

## 2017-05-28 NOTE — Telephone Encounter (Signed)
Synthroid 75 mcg daily. Call in #90 with 3 rf

## 2017-05-31 ENCOUNTER — Other Ambulatory Visit: Payer: Self-pay | Admitting: Family Medicine

## 2017-06-03 NOTE — Telephone Encounter (Signed)
Last OV 05/22/17 last filled 11/12/16 90 0rf

## 2017-06-03 NOTE — Telephone Encounter (Signed)
Call in #90 with 5 rf 

## 2017-06-06 ENCOUNTER — Encounter: Payer: Self-pay | Admitting: Family Medicine

## 2017-06-18 ENCOUNTER — Other Ambulatory Visit: Payer: Self-pay | Admitting: Endocrinology

## 2017-06-18 NOTE — Telephone Encounter (Signed)
Please refill x 3 Ov is due

## 2017-07-02 ENCOUNTER — Encounter: Payer: Self-pay | Admitting: Endocrinology

## 2017-07-02 ENCOUNTER — Encounter: Payer: Self-pay | Admitting: Family Medicine

## 2017-07-03 ENCOUNTER — Other Ambulatory Visit: Payer: Self-pay

## 2017-07-03 MED ORDER — INSULIN GLARGINE 100 UNIT/ML SOLOSTAR PEN: 250.0000 [IU] | PEN_INJECTOR | SUBCUTANEOUS | 1 refills | Status: DC

## 2017-08-09 ENCOUNTER — Other Ambulatory Visit (INDEPENDENT_AMBULATORY_CARE_PROVIDER_SITE_OTHER): Payer: BLUE CROSS/BLUE SHIELD

## 2017-08-09 ENCOUNTER — Telehealth: Payer: Self-pay | Admitting: Family Medicine

## 2017-08-09 ENCOUNTER — Encounter: Payer: Self-pay | Admitting: Family Medicine

## 2017-08-09 DIAGNOSIS — N390 Urinary tract infection, site not specified: Secondary | ICD-10-CM

## 2017-08-09 LAB — POCT URINALYSIS DIPSTICK
Bilirubin, UA: POSITIVE
Blood, UA: NEGATIVE
GLUCOSE UA: POSITIVE — AB
Ketones, UA: POSITIVE
Leukocytes, UA: NEGATIVE
Nitrite, UA: NEGATIVE
PH UA: 5.5 (ref 5.0–8.0)
Protein, UA: POSITIVE — AB
Spec Grav, UA: 1.025 (ref 1.010–1.025)
Urobilinogen, UA: 1 E.U./dL

## 2017-08-09 MED ORDER — CIPROFLOXACIN HCL 500 MG PO TABS: 500.0000 mg | ORAL_TABLET | Freq: Two times a day (BID) | ORAL | 0 refills | Status: DC

## 2017-08-09 NOTE — Telephone Encounter (Signed)
Sent to PCP to advise 

## 2017-08-09 NOTE — Telephone Encounter (Unsigned)
Copied from [place] [phone]. Topic: Quick Communication - See Telephone Encounter >> Aug 09, 2017 11:39 AM Neva Seat wrote: Pt has UTI symptoms - temp - foul smelling urine. Pt has had reoccurring UTI symptoms.  Pt wants to come in to give a specimen for testing and medication.

## 2017-08-09 NOTE — Telephone Encounter (Signed)
Called and spoke with pt. Pt advised coming in to drop off a sample.

## 2017-08-09 NOTE — Telephone Encounter (Signed)
Okay she can just come in to give a sample

## 2017-08-09 NOTE — Telephone Encounter (Signed)
Orders have been placed.

## 2017-08-09 NOTE — Addendum Note (Signed)
Addended by: Rene Kocher on: 08/09/2017 05:04 PM   Modules accepted: Orders

## 2017-08-09 NOTE — Telephone Encounter (Signed)
I sent in Cipro. I spoke to her over the phone and she is aware. She will be drinking plenty of fluids.

## 2017-08-09 NOTE — Progress Notes (Signed)
u

## 2017-08-10 LAB — URINE CULTURE
MICRO NUMBER:: 90857920
SPECIMEN QUALITY:: ADEQUATE

## 2017-08-11 ENCOUNTER — Other Ambulatory Visit: Payer: Self-pay

## 2017-08-11 ENCOUNTER — Emergency Department (HOSPITAL_COMMUNITY)
Admission: EM | Admit: 2017-08-11 | Discharge: 2017-08-11 | Disposition: A | Discharge status: Home / Self Care | Payer: BLUE CROSS/BLUE SHIELD | Attending: Emergency Medicine | Admitting: Emergency Medicine

## 2017-08-11 ENCOUNTER — Emergency Department (HOSPITAL_BASED_OUTPATIENT_CLINIC_OR_DEPARTMENT_OTHER)
Admit: 2017-08-11 | Discharge: 2017-08-11 | Disposition: A | Discharge status: Home / Self Care | Payer: BLUE CROSS/BLUE SHIELD

## 2017-08-11 ENCOUNTER — Encounter (HOSPITAL_COMMUNITY): Payer: Self-pay

## 2017-08-11 DIAGNOSIS — Z8673 Personal history of transient ischemic attack (TIA), and cerebral infarction without residual deficits: Secondary | ICD-10-CM | POA: Insufficient documentation

## 2017-08-11 DIAGNOSIS — E119 Type 2 diabetes mellitus without complications: Secondary | ICD-10-CM | POA: Insufficient documentation

## 2017-08-11 DIAGNOSIS — M79604 Pain in right leg: Secondary | ICD-10-CM | POA: Diagnosis present

## 2017-08-11 DIAGNOSIS — J45909 Unspecified asthma, uncomplicated: Secondary | ICD-10-CM | POA: Diagnosis not present

## 2017-08-11 DIAGNOSIS — Z794 Long term (current) use of insulin: Secondary | ICD-10-CM | POA: Insufficient documentation

## 2017-08-11 DIAGNOSIS — L03115 Cellulitis of right lower limb: Secondary | ICD-10-CM

## 2017-08-11 DIAGNOSIS — Z87891 Personal history of nicotine dependence: Secondary | ICD-10-CM | POA: Diagnosis not present

## 2017-08-11 DIAGNOSIS — Z853 Personal history of malignant neoplasm of breast: Secondary | ICD-10-CM | POA: Diagnosis not present

## 2017-08-11 DIAGNOSIS — Z79899 Other long term (current) drug therapy: Secondary | ICD-10-CM | POA: Insufficient documentation

## 2017-08-11 DIAGNOSIS — R609 Edema, unspecified: Secondary | ICD-10-CM | POA: Diagnosis not present

## 2017-08-11 LAB — URINALYSIS, ROUTINE W REFLEX MICROSCOPIC
BILIRUBIN URINE: NEGATIVE
Glucose, UA: 50 mg/dL — AB
Hgb urine dipstick: NEGATIVE
KETONES UR: NEGATIVE mg/dL
LEUKOCYTES UA: NEGATIVE
NITRITE: NEGATIVE
Protein, ur: NEGATIVE mg/dL
Specific Gravity, Urine: 1.026 (ref 1.005–1.030)
pH: 5 (ref 5.0–8.0)

## 2017-08-11 LAB — BASIC METABOLIC PANEL
ANION GAP: 11 (ref 5–15)
BUN: 16 mg/dL (ref 6–20)
CALCIUM: 9.1 mg/dL (ref 8.9–10.3)
CO2: 26 mmol/L (ref 22–32)
Chloride: 103 mmol/L (ref 98–111)
Creatinine, Ser: 1.01 mg/dL — ABNORMAL HIGH (ref 0.44–1.00)
GFR calc Af Amer: >60 mL/min (ref >60)
GLUCOSE: 255 mg/dL — AB (ref 70–99)
Potassium: 4.3 mmol/L (ref 3.5–5.1)
SODIUM: 140 mmol/L (ref 135–145)

## 2017-08-11 LAB — CBC WITH DIFFERENTIAL/PLATELET
BASOS ABS: 0 10*3/uL (ref 0.0–0.1)
BASOS PCT: 0 %
EOS ABS: 0.1 10*3/uL (ref 0.0–0.7)
Eosinophils Relative: 2 %
HCT: 43.3 % (ref 36.0–46.0)
Hemoglobin: 13.6 g/dL (ref 12.0–15.0)
Lymphocytes Relative: 22 %
Lymphs Abs: 1.3 10*3/uL (ref 0.7–4.0)
MCH: 25.4 pg — ABNORMAL LOW (ref 26.0–34.0)
MCHC: 31.4 g/dL (ref 30.0–36.0)
MCV: 80.9 fL (ref 78.0–100.0)
MONO ABS: 0.4 10*3/uL (ref 0.1–1.0)
MONOS PCT: 6 %
NEUTROS PCT: 70 %
Neutro Abs: 4 10*3/uL (ref 1.7–7.7)
Platelets: 148 10*3/uL — ABNORMAL LOW (ref 150–400)
RBC: 5.35 MIL/uL — ABNORMAL HIGH (ref 3.87–5.11)
RDW: 16 % — AB (ref 11.5–15.5)
WBC: 5.8 10*3/uL (ref 4.0–10.5)

## 2017-08-11 MED ORDER — FLUCONAZOLE 150 MG PO TABS: 150.0000 mg | ORAL_TABLET | Freq: Every day | ORAL | 0 refills | Status: DC

## 2017-08-11 MED ORDER — CEPHALEXIN 500 MG PO CAPS: 500.0000 mg | ORAL_CAPSULE | Freq: Four times a day (QID) | ORAL | 0 refills | Status: DC

## 2017-08-11 MED ORDER — CEPHALEXIN 500 MG PO CAPS
500.0000 mg | ORAL_CAPSULE | Freq: Once | ORAL | Status: AC
  Administered 2017-08-11: 500 mg via ORAL
  Filled 2017-08-11: qty 1

## 2017-08-11 NOTE — ED Triage Notes (Signed)
Pt states she is concerned for cellulitis in her right leg. Pt's ankle is reddened, warm to the touch, and swollen.

## 2017-08-11 NOTE — Discharge Instructions (Signed)
Take Keflex four times daily for the next 5 days. Return if you develop fevers, worsening redness or pain Take Diflucan once if you start to have symptoms of yeast infection. Repeat 2nd dose in 72 hours.

## 2017-08-11 NOTE — ED Provider Notes (Signed)
[place] DEPT Provider Note   CSN: [id number] Arrival date & time: 08/11/17  1310     History   Chief Complaint Chief Complaint  Patient presents with  . Cellulitis    HPI Cheyenne Guzman is a 52 y.o. female who presents with right ankle and leg pain and swelling.  Past medical history significant for morbid obesity, history of breast cancer, diabetes, history of pyelonephritis and urosepsis.  She states that she had a low-grade fever of 100 on Friday along with low back pain and urinary frequency with malodorous urine.  She went to her doctor's office and gave a urinalysis which was equivocal for UTI but was called in Cipro because of her history of urosepsis.  She is been taking this antibiotic and then yesterday she noticed redness and pain over the right ankle.  Today the area of redness has spread up the leg and she is concerned about possible infection.  She also concerned about possible blood clot.  She has had cellulitis in the past of the left leg and symptoms are similar.  She denies fever, chills, abdominal pain, nausea, vomiting.  Her urinary frequency has improved.  HPI  Past Medical History:  Diagnosis Date  . Anxiety    per pt. - mild   . Arthritis    back & ankles   . Asthma    seasonal   . Breast mass in female   . Cancer [facility])    breast, sees Dr. Jana Hakim , Left - 11/2007  . Complication of anesthesia 2013   severe muscle spasms-sore-no doc  . Depression   . Diabetes mellitus    sees Dr. Chalmers Cater, diagnosed 2003  . HA (headache)   . Hearing loss    Went to [facility] Throat,Dr [facility]  . Hyperlipidemia   . Kidney infection    - hosp. [facility]- septic- 10/2009  . Low back pain   . Peripheral neuropathy    R sided numbness- face & jaw  . Sleep apnea 9/15   severe-just started cpap-doing well  . Stroke [facility])    tia-no deficit  . TIA (transient ischemic attack)    in 2007,2008, and 2011  . Tinnitus of right  ear   . Urosepsis 08-2009   with Klebsiella    Patient Active Problem List   Diagnosis Date Noted  . Edema of both legs 10/18/2015  . Migraine 06/16/2014  . Lumbar radiculopathy 03/18/2014  . Diabetic neuropathy ([place]) 01/07/2014  . Congenital spondylolysis of lumbosacral region 11/02/2013  . SPL (spondylolisthesis) 10/08/2013  . Chronic LBP 10/08/2013  . Obstructive sleep apnea 07/31/2013  . HA (headache)   . Tinnitus 06/23/2013  . Hearing loss 06/23/2013  . Headache(784.0) 06/23/2013  . Low back pain 06/10/2013  . SOB (shortness of breath) 11/27/2012  . Chest pain 11/27/2012  . Cellulitis of left leg 07/03/2012  . Morbid obesity ([place]) 07/03/2012  . Abdominal pain 02/01/2012  . Breast mass, right 07/13/2011  . DCIS (ductal carcinoma in situ) of breast, left, treated with [facility] 11/2007 02/19/2011  . TIA 10/13/2009  . BACK PAIN, LUMBAR 10/13/2009  . PYELONEPHRITIS 09/09/2009  . Urinary tract infection, site not specified 08/16/2008  . ASTHMA 02/20/2008  . CANDIDIASIS 08/28/2007  . Diabetes mellitus without complication ([place]) 95/28/4132  . HYPERLIPIDEMIA 11/26/2006  . ANXIETY 11/26/2006  . DEPRESSION 11/26/2006  . ACUTE BRONCHITIS 11/26/2006    Past Surgical History:  Procedure Laterality Date  . ABDOMINAL HYSTERECTOMY  08/2004  . BREAST BIOPSY  06/22/2011   Procedure: BREAST BIOPSY;  Surgeon: Stark Klein, MD;  Location: [place];  Service: General;  Laterality: Right;  . BREAST SURGERY  11/2007   left ductal carcinoma in situ  . CARPAL TUNNEL RELEASE Right 10/13/2013   Procedure: RIGHT CARPAL TUNNEL RELEASE;  Surgeon: Hessie Dibble, MD;  Location: [place];  Service: Orthopedics;  Laterality: Right;  . CARPAL TUNNEL RELEASE Left 11/24/2013   Procedure: LEFT CARPAL TUNNEL RELEASE WITH CYST EXCISION ;  Surgeon: Hessie Dibble, MD;  Location: [place];  Service: Orthopedics;  Laterality: Left;  . EAR CYST EXCISION Left 11/24/2013    Procedure: CYST REMOVAL;  Surgeon: Hessie Dibble, MD;  Location: [place];  Service: Orthopedics;  Laterality: Left;  . FOOT SURGERY  2000   plantar fasciitis-left  . LUMBAR DISC SURGERY  03-29-14   Fusion, revision 04-27-14 L4-5 Cauda Equina with graft  . NERVE GRAFT  06-01-08   cadaver graft to right inferior alveolar nerve  in [place]     OB History   None      Home Medications    Prior to Admission medications   Medication Sig Start Date End Date Taking? Authorizing Provider  albuterol (PROVENTIL HFA;VENTOLIN HFA) 108 (90 Base) MCG/ACT inhaler Inhale 2 puffs into the lungs every 4 (four) hours as needed for wheezing or shortness of breath. 04/03/17  Yes Laurey Morale, MD  albuterol (PROVENTIL) (2.5 MG/3ML) 0.083% nebulizer solution 1 VIAL IN NEBULIZER EVERY 4 HOURS AS NEEDED FOR WHEEZING 10/13/13  Yes Laurey Morale, MD  ALPRAZolam Duanne Moron) 1 MG tablet TAKE 1 TABLET BY MOUTH 3 TIMES A DAY Patient taking differently: TAKE 1 TABLET BY MOUTH daily prn anxiety 06/04/17  Yes Laurey Morale, MD  aspirin 325 MG EC tablet Take 325 mg by mouth at bedtime. Reported on 04/19/2015   Yes [provider]  Blood Glucose Monitoring Suppl (CONTOUR NEXT MONITOR) w/Device KIT 1 each by Does not apply route 2 (two) times daily. To check blood sugars twice a day. 08/27/16  Yes Renato Shin, MD  buprenorphine (BUTRANS - DOSED MCG/HR) 10 MCG/HR PTWK patch Place 1 patch onto the skin once a week. Last patch was removed on 07/14; pt is having trouble with patch adhering to skin 07/23/17  Yes [provider]  ciprofloxacin (CIPRO) 500 MG tablet Take 1 tablet (500 mg total) by mouth 2 (two) times daily. 08/09/17  Yes Laurey Morale, MD  furosemide (LASIX) 40 MG tablet TAKE 1 TABLET (40 MG TOTAL) BY MOUTH DAILY. AS NEEDED FOR SWELLING. 02/28/16  Yes Laurey Morale, MD  gabapentin (NEURONTIN) 300 MG capsule Take 300 mg by mouth 3 (three) times daily.    Yes [provider]    glucose blood (CONTOUR NEXT TEST) test strip Use to test blood sugar 2 times daily 12/18/16  Yes Renato Shin, MD  glucose blood test strip Use as instructed 08/24/16  Yes Renato Shin, MD  Insulin Glargine (LANTUS SOLOSTAR) 100 UNIT/ML Solostar Pen Inject 250 Units into the skin every morning. and pen needles 3/day 07/03/17  Yes Renato Shin, MD  levothyroxine (SYNTHROID, LEVOTHROID) 75 MCG tablet Take 1 tablet (75 mcg total) by mouth daily. 05/28/17  Yes Laurey Morale, MD  methocarbamol (ROBAXIN) 750 MG tablet Take 1 tablet (750 mg total) by mouth 2 (two) times daily. 02/03/15  Yes Laurey Morale, MD  Oxycodone HCl 10 MG  TABS Take 10 mg by mouth 3 (three) times daily.   Yes [provider]  sertraline (ZOLOFT) 100 MG tablet TAKE 2 TABLETS (200 MG TOTAL) BY MOUTH AT BEDTIME. Patient taking differently: Take 200 mg by mouth daily.  05/15/17  Yes Laurey Morale, MD  LANTUS SOLOSTAR 100 UNIT/ML Solostar Pen INJECT 250 UNITS INTO THE SKIN EVERY MORNING. AND PEN NEEDLES 3/DAY Patient not taking: Reported on 08/11/2017 06/18/17   Renato Shin, MD    Family History Family History  Problem Relation Age of Onset  . Cancer Mother        breast  . Cancer Father        lung  . Diabetes Father   . Cancer Sister        colon  . Cancer Maternal Aunt        breast - both  . Diabetes Sister   . Anesthesia problems Neg Hx     Social History Social History   Tobacco Use  . Smoking status: Former Smoker    Last attempt to quit: 02/18/1997    Years since quitting: 20.4  . Smokeless tobacco: Never Used  Substance Use Topics  . Alcohol use: Yes    Comment: rare  . Drug use: No     Allergies   Patient has no known allergies.   Review of Systems Review of Systems  Constitutional: Positive for fever (Resolved). Negative for chills.  Respiratory: Negative for cough and shortness of breath.   Cardiovascular: Positive for leg swelling. Negative for chest pain.  Gastrointestinal:  Negative for abdominal pain, nausea and vomiting.  Genitourinary: Positive for frequency (Resolved). Negative for difficulty urinating and dysuria.  Skin: Positive for color change. Negative for wound.  All other systems reviewed and are negative.    Physical Exam Updated Vital Signs BP (!) 169/84 (BP Location: Left Wrist)   Pulse 86   Temp 98.2 F (36.8 C) (Oral)   Resp 16   Ht '5\' 7"'  (1.702 m)   Wt (!) 192.8 kg (425 lb)   SpO2 99%   BMI 66.56 kg/m   Physical Exam  Constitutional: She is oriented to person, place, and time. She appears well-developed and well-nourished. No distress.  Calm, cooperative. Morbidly obese  HENT:  Head: Normocephalic and atraumatic.  Eyes: Pupils are equal, round, and reactive to light. Conjunctivae are normal. Right eye exhibits no discharge. Left eye exhibits no discharge. No scleral icterus.  Neck: Normal range of motion.  Cardiovascular: Normal rate and regular rhythm.  Pulmonary/Chest: Effort normal and breath sounds normal. No respiratory distress.  Abdominal: Soft. Bowel sounds are normal. She exhibits no distension. There is no tenderness.  Musculoskeletal:  Erythema and warmth of the right ankle which extends up the lower leg. 2+ DP pulse. No open wounds. Normal cap refill. Compartments are soft  Neurological: She is alert and oriented to person, place, and time.  Skin: Skin is warm and dry.  Psychiatric: She has a normal mood and affect. Her behavior is normal.  Nursing note and vitals reviewed.    ED Treatments / Results  Labs (all labs ordered are listed, but only abnormal results are displayed) Labs Reviewed  BASIC METABOLIC PANEL - Abnormal; Notable for the following components:      Result Value   Glucose, Bld 255 (*)    Creatinine, Ser 1.01 (*)    All other components within normal limits  CBC WITH DIFFERENTIAL/PLATELET - Abnormal; Notable for the following components:  RBC 5.35 (*)    MCH 25.4 (*)    RDW 16.0 (*)     Platelets 148 (*)    All other components within normal limits  URINALYSIS, ROUTINE W REFLEX MICROSCOPIC - Abnormal; Notable for the following components:   Glucose, UA 50 (*)    All other components within normal limits    EKG None  Radiology No results found.  Procedures Procedures (including critical care time)  Medications Ordered in ED Medications  cephALEXin (KEFLEX) capsule 500 mg (500 mg Oral Given 08/11/17 1420)     Initial Impression / Assessment and Plan / ED Course  I have reviewed the triage vital signs and the nursing notes.  Pertinent labs & imaging results that were available during my care of the patient were reviewed by me and considered in my medical decision making (see chart for details).  52 year old female presents with concern for red, warm, swollen right lower extremity for the past day.  Her vital signs are remarkable for hypertension which has resolved on recheck.  On exam she does have redness and warmth of the right lower extremity consistent with a cellulitis.  Lower extremity ultrasound was obtained which is negative for DVT.  Blood work is overall unremarkable other than hyperglycemia.  She is scheduled to see her endocrinologist later this week.  She was given prescription for Keflex and, at her request, Diflucan.  Return precautions were given.  Final Clinical Impressions(s) / ED Diagnoses   Final diagnoses:  Cellulitis of right leg    ED Discharge Orders    None       Recardo Evangelist, PA-C 08/11/17 Diamantina Monks, MD 08/12/17 2325

## 2017-08-11 NOTE — Progress Notes (Signed)
Right lower extremity venous duplex has been completed. Negative for obvious evidence of DVT. Results were given to Janetta Hora PA.  08/11/17 3:44 PM Carlos Levering RVT

## 2017-08-12 ENCOUNTER — Telehealth: Payer: Self-pay

## 2017-08-12 ENCOUNTER — Encounter: Payer: Self-pay | Admitting: Endocrinology

## 2017-08-12 NOTE — Telephone Encounter (Signed)
I called patient to clarify message sent via mychart for appointment. I asked that she call back to discuss.

## 2017-08-14 ENCOUNTER — Encounter: Payer: Self-pay | Admitting: Endocrinology

## 2017-08-14 ENCOUNTER — Ambulatory Visit: Payer: BLUE CROSS/BLUE SHIELD | Admitting: Endocrinology

## 2017-08-14 VITALS — BP 132/84 | HR 102 | Ht 67.0 in | Wt >= 6400 oz

## 2017-08-14 DIAGNOSIS — E039 Hypothyroidism, unspecified: Secondary | ICD-10-CM | POA: Diagnosis not present

## 2017-08-14 DIAGNOSIS — E119 Type 2 diabetes mellitus without complications: Secondary | ICD-10-CM

## 2017-08-14 LAB — TSH: TSH: 3.52 u[IU]/mL (ref 0.35–4.50)

## 2017-08-14 LAB — POCT GLYCOSYLATED HEMOGLOBIN (HGB A1C): HEMOGLOBIN A1C: 10.3 % — AB (ref 4.0–5.6)

## 2017-08-14 MED ORDER — INSULIN GLARGINE 100 UNIT/ML SOLOSTAR PEN: 250.0000 [IU] | PEN_INJECTOR | SUBCUTANEOUS | 11 refills | Status: DC

## 2017-08-14 NOTE — Patient Instructions (Addendum)
Please increase the insulin to 250 units each morning. This will reduce the need for humalog.  I have sent a prescription to your pharmacy.   A thyroid blood test is requested for you today.  We'll let you know about the results.  check your blood sugar twice a day.  vary the time of day when you check, between before the 3 meals, and at bedtime.  also check if you have symptoms of your blood sugar being too high or too low.  please keep a record of the readings and bring it to your next appointment here (or you can bring the meter itself).  You can write it on any piece of paper.  please call us sooner if your blood sugar goes below 70, or if you have a lot of readings over 200. Please come back for a follow-up appointment in 2 months.

## 2017-08-14 NOTE — Progress Notes (Signed)
Subjective:    Patient ID: Cheyenne Guzman, female    DOB: 1965-06-08, 52 y.o.   MRN: [id number]  HPI  Pt is referred by Dr Sarajane Jews, for hypothyroidism (she was dx'ed with hyperthyroidism in 2011; she has never had thyroid imaging; in 2018, she spontaneously developed hypothyroidism). She has been on synthroid since then. Pt returns for f/u of diabetes mellitus: DM type: Insulin-requiring type 2 Dx'ed: 0254 Complications: polyneuropathy, TIA and retinopathy.   Therapy: insulin since 2009 GDM: never DKA: never Severe hypoglycemia: never Pancreatitis: never Other: she declines weight loss surgery; she was changed to qd insulin, after poor results with multiple daily injections Interval history: She recently took prednisone for back pain.  She has been off x 5 weeks.  Since then, cbg's vary from 165-343.  She takes lantus, 125/d, and PRN humalog (averages a total of 50 units per day).   Past Medical History:  Diagnosis Date  . Anxiety    per pt. - mild   . Arthritis    back & ankles   . Asthma    seasonal   . Breast mass in female   . Cancer [facility])    breast, sees Dr. Jana Hakim , Left - 11/2007  . Complication of anesthesia 2013   severe muscle spasms-sore-no doc  . Depression   . Diabetes mellitus    sees Dr. Chalmers Cater, diagnosed 2003  . HA (headache)   . Hearing loss    Went to [facility] Throat,Dr [facility]  . Hyperlipidemia   . Kidney infection    - hosp. [facility]- septic- 10/2009  . Low back pain   . Peripheral neuropathy    R sided numbness- face & jaw  . Sleep apnea 9/15   severe-just started cpap-doing well  . Stroke [facility])    tia-no deficit  . TIA (transient ischemic attack)    in 2007,2008, and 2011  . Tinnitus of right ear   . Urosepsis 08-2009   with Klebsiella    Past Surgical History:  Procedure Laterality Date  . ABDOMINAL HYSTERECTOMY  08/2004  . BREAST BIOPSY  06/22/2011   Procedure: BREAST BIOPSY;  Surgeon: Stark Klein, MD;  Location: [place];   Service: General;  Laterality: Right;  . BREAST SURGERY  11/2007   left ductal carcinoma in situ  . CARPAL TUNNEL RELEASE Right 10/13/2013   Procedure: RIGHT CARPAL TUNNEL RELEASE;  Surgeon: Hessie Dibble, MD;  Location: [place];  Service: Orthopedics;  Laterality: Right;  . CARPAL TUNNEL RELEASE Left 11/24/2013   Procedure: LEFT CARPAL TUNNEL RELEASE WITH CYST EXCISION ;  Surgeon: Hessie Dibble, MD;  Location: [place];  Service: Orthopedics;  Laterality: Left;  . EAR CYST EXCISION Left 11/24/2013   Procedure: CYST REMOVAL;  Surgeon: Hessie Dibble, MD;  Location: [place];  Service: Orthopedics;  Laterality: Left;  . FOOT SURGERY  2000   plantar fasciitis-left  . LUMBAR DISC SURGERY  03-29-14   Fusion, revision 04-27-14 L4-5 Cauda Equina with graft  . NERVE GRAFT  06-01-08   cadaver graft to right inferior alveolar nerve  [place]    Social History   Socioeconomic History  . Marital status: Married    Spouse name: [name]  . Number of children: 2  . Years of education: 27  . Highest education level: Not on file  Occupational History    Comment: [employer]  . Emergency planning/management officer  strain: Not on file  . Food insecurity:    Worry: Not on file    Inability: Not on file  . Transportation needs:    Medical: Not on file    Non-medical: Not on file  Tobacco Use  . Smoking status: Former Smoker    Last attempt to quit: 02/18/1997    Years since quitting: 20.5  . Smokeless tobacco: Never Used  Substance and Sexual Activity  . Alcohol use: Yes    Comment: rare  . Drug use: No  . Sexual activity: Not on file  Lifestyle  . Physical activity:    Days per week: Not on file    Minutes per session: Not on file  . Stress: Not on file  Relationships  . Social connections:    Talks on phone: Not on file    Gets together: Not on file    Attends religious service: Not on file    Active member of club or  organization: Not on file    Attends meetings of clubs or organizations: Not on file    Relationship status: Not on file  . Intimate partner violence:    Fear of current or ex partner: Not on file    Emotionally abused: Not on file    Physically abused: Not on file    Forced sexual activity: Not on file  Other Topics Concern  . Not on file  Social History Narrative   Patient lives at home with her husband [name]) and two daughters.    Patient works for [employer] full time patient is on medical leave at this time.   Education two years of college.   Right handed.   Caffeine two times per day.    Current Outpatient Medications on File Prior to Visit  Medication Sig Dispense Refill  . albuterol (PROVENTIL HFA;VENTOLIN HFA) 108 (90 Base) MCG/ACT inhaler Inhale 2 puffs into the lungs every 4 (four) hours as needed for wheezing or shortness of breath. 1 Inhaler 11  . albuterol (PROVENTIL) (2.5 MG/3ML) 0.083% nebulizer solution 1 VIAL IN NEBULIZER EVERY 4 HOURS AS NEEDED FOR WHEEZING 75 mL 1  . ALPRAZolam (XANAX) 1 MG tablet TAKE 1 TABLET BY MOUTH 3 TIMES A DAY (Patient taking differently: TAKE 1 TABLET BY MOUTH daily prn anxiety) 90 tablet 5  . aspirin 325 MG EC tablet Take 325 mg by mouth at bedtime. Reported on 04/19/2015    . Blood Glucose Monitoring Suppl (CONTOUR NEXT MONITOR) w/Device KIT 1 each by Does not apply route 2 (two) times daily. To check blood sugars twice a day. 1 kit 0  . buprenorphine (BUTRANS - DOSED MCG/HR) 10 MCG/HR PTWK patch Place 1 patch onto the skin once a week. Last patch was removed on 07/14; pt is having trouble with patch adhering to skin  0  . cephALEXin (KEFLEX) 500 MG capsule Take 1 capsule (500 mg total) by mouth 4 (four) times daily. 20 capsule 0  . ciprofloxacin (CIPRO) 500 MG tablet Take 1 tablet (500 mg total) by mouth 2 (two) times daily. 14 tablet 0  . fluconazole (DIFLUCAN) 150 MG tablet Take 1 tablet (150 mg total) by mouth daily. Repeat 2nd dose  in 72 hours if you are still having symptoms 2 tablet 0  . furosemide (LASIX) 40 MG tablet TAKE 1 TABLET (40 MG TOTAL) BY MOUTH DAILY. AS NEEDED FOR SWELLING. 30 tablet 6  . gabapentin (NEURONTIN) 300 MG capsule Take 300 mg by mouth 3 (three) times daily.     Marland Kitchen  glucose blood (CONTOUR NEXT TEST) test strip Use to test blood sugar 2 times daily 100 each 12  . glucose blood test strip Use as instructed 100 each 0  . levothyroxine (SYNTHROID, LEVOTHROID) 75 MCG tablet Take 1 tablet (75 mcg total) by mouth daily. 90 tablet 3  . methocarbamol (ROBAXIN) 750 MG tablet Take 1 tablet (750 mg total) by mouth 2 (two) times daily. 60 tablet 5  . Oxycodone HCl 10 MG TABS Take 10 mg by mouth 3 (three) times daily.    . sertraline (ZOLOFT) 100 MG tablet TAKE 2 TABLETS (200 MG TOTAL) BY MOUTH AT BEDTIME. (Patient taking differently: Take 200 mg by mouth daily. ) 180 tablet 3   No current facility-administered medications on file prior to visit.     No Known Allergies  Family History  Problem Relation Age of Onset  . Cancer Mother        breast  . Cancer Father        lung  . Diabetes Father   . Cancer Sister        colon  . Cancer Maternal Aunt        breast - both  . Diabetes Sister   . Anesthesia problems Neg Hx     BP 132/84 (BP Location: Left Arm, Patient Position: Sitting, Cuff Size: Large)   Pulse (!) 102   Ht '5\' 7"'  (1.702 m)   Wt (!) 420 lb 12.8 oz (190.9 kg)   SpO2 95%   BMI 65.91 kg/m    Review of Systems She denies hypoglycemia    Objective:   Physical Exam VITAL SIGNS:  See vs page GENERAL: no distress Pulses: foot pulses are intact bilaterally.   MSK: no deformity of the feet or ankles.  CV: 1+ bilat edema of the legs.  Skin:  no ulcer on the feet or ankles.  normal color and temp on the feet and ankles Neuro: sensation is intact to touch on the feet and ankles.    A1c=10.3%     Assessment & Plan:  Insulin-requiring type 2 DM: he is taking less insulin than rx'ed.    Back pain: steroid rx may be affecting A1c.  Hypothyroidism: receck today  Patient Instructions  Please increase the insulin to 250 units each morning. This will reduce the need for humalog.  I have sent a prescription to your pharmacy.   A thyroid blood test is requested for you today.  We'll let you know about the results.  check your blood sugar twice a day.  vary the time of day when you check, between before the 3 meals, and at bedtime.  also check if you have symptoms of your blood sugar being too high or too low.  please keep a record of the readings and bring it to your next appointment here (or you can bring the meter itself).  You can write it on any piece of paper.  please call us sooner if your blood sugar goes below 70, or if you have a lot of readings over 200. Please come back for a follow-up appointment in 2 months.

## 2017-09-05 ENCOUNTER — Emergency Department (HOSPITAL_COMMUNITY): Payer: BLUE CROSS/BLUE SHIELD

## 2017-09-05 ENCOUNTER — Observation Stay (HOSPITAL_COMMUNITY)
Admission: EM | Admit: 2017-09-05 | Discharge: 2017-09-07 | Disposition: A | Discharge status: Home / Self Care | Payer: BLUE CROSS/BLUE SHIELD | Attending: Internal Medicine | Admitting: Internal Medicine

## 2017-09-05 ENCOUNTER — Observation Stay (HOSPITAL_COMMUNITY): Payer: BLUE CROSS/BLUE SHIELD

## 2017-09-05 ENCOUNTER — Encounter (HOSPITAL_COMMUNITY): Payer: Self-pay

## 2017-09-05 DIAGNOSIS — E785 Hyperlipidemia, unspecified: Secondary | ICD-10-CM | POA: Diagnosis not present

## 2017-09-05 DIAGNOSIS — F329 Major depressive disorder, single episode, unspecified: Secondary | ICD-10-CM | POA: Insufficient documentation

## 2017-09-05 DIAGNOSIS — R0602 Shortness of breath: Secondary | ICD-10-CM

## 2017-09-05 DIAGNOSIS — E1140 Type 2 diabetes mellitus with diabetic neuropathy, unspecified: Secondary | ICD-10-CM | POA: Diagnosis not present

## 2017-09-05 DIAGNOSIS — R6510 Systemic inflammatory response syndrome (SIRS) of non-infectious origin without acute organ dysfunction: Secondary | ICD-10-CM

## 2017-09-05 DIAGNOSIS — Z79890 Hormone replacement therapy (postmenopausal): Secondary | ICD-10-CM | POA: Diagnosis not present

## 2017-09-05 DIAGNOSIS — G894 Chronic pain syndrome: Secondary | ICD-10-CM | POA: Diagnosis not present

## 2017-09-05 DIAGNOSIS — L0390 Cellulitis, unspecified: Secondary | ICD-10-CM

## 2017-09-05 DIAGNOSIS — G039 Meningitis, unspecified: Secondary | ICD-10-CM

## 2017-09-05 DIAGNOSIS — R509 Fever, unspecified: Secondary | ICD-10-CM | POA: Diagnosis present

## 2017-09-05 DIAGNOSIS — Z794 Long term (current) use of insulin: Secondary | ICD-10-CM | POA: Diagnosis not present

## 2017-09-05 DIAGNOSIS — Z7982 Long term (current) use of aspirin: Secondary | ICD-10-CM | POA: Diagnosis not present

## 2017-09-05 DIAGNOSIS — G4733 Obstructive sleep apnea (adult) (pediatric): Secondary | ICD-10-CM | POA: Diagnosis not present

## 2017-09-05 DIAGNOSIS — L03115 Cellulitis of right lower limb: Secondary | ICD-10-CM | POA: Diagnosis not present

## 2017-09-05 DIAGNOSIS — E119 Type 2 diabetes mellitus without complications: Secondary | ICD-10-CM

## 2017-09-05 DIAGNOSIS — G43909 Migraine, unspecified, not intractable, without status migrainosus: Secondary | ICD-10-CM | POA: Diagnosis not present

## 2017-09-05 DIAGNOSIS — E039 Hypothyroidism, unspecified: Secondary | ICD-10-CM | POA: Diagnosis present

## 2017-09-05 DIAGNOSIS — Z8673 Personal history of transient ischemic attack (TIA), and cerebral infarction without residual deficits: Secondary | ICD-10-CM | POA: Diagnosis not present

## 2017-09-05 DIAGNOSIS — J45909 Unspecified asthma, uncomplicated: Secondary | ICD-10-CM | POA: Diagnosis present

## 2017-09-05 DIAGNOSIS — B349 Viral infection, unspecified: Principal | ICD-10-CM

## 2017-09-05 DIAGNOSIS — F419 Anxiety disorder, unspecified: Secondary | ICD-10-CM | POA: Insufficient documentation

## 2017-09-05 DIAGNOSIS — Z6841 Body Mass Index (BMI) 40.0 and over, adult: Secondary | ICD-10-CM | POA: Insufficient documentation

## 2017-09-05 DIAGNOSIS — F411 Generalized anxiety disorder: Secondary | ICD-10-CM | POA: Diagnosis present

## 2017-09-05 DIAGNOSIS — Z79899 Other long term (current) drug therapy: Secondary | ICD-10-CM | POA: Diagnosis not present

## 2017-09-05 DIAGNOSIS — Z87891 Personal history of nicotine dependence: Secondary | ICD-10-CM | POA: Insufficient documentation

## 2017-09-05 DIAGNOSIS — Z853 Personal history of malignant neoplasm of breast: Secondary | ICD-10-CM | POA: Insufficient documentation

## 2017-09-05 LAB — RESPIRATORY PANEL BY PCR
Adenovirus: NOT DETECTED
BORDETELLA PERTUSSIS-RVPCR: NOT DETECTED
CORONAVIRUS HKU1-RVPPCR: NOT DETECTED
CORONAVIRUS OC43-RVPPCR: NOT DETECTED
Chlamydophila pneumoniae: NOT DETECTED
Coronavirus 229E: NOT DETECTED
Coronavirus NL63: NOT DETECTED
Influenza A: NOT DETECTED
Influenza B: NOT DETECTED
METAPNEUMOVIRUS-RVPPCR: NOT DETECTED
Mycoplasma pneumoniae: NOT DETECTED
PARAINFLUENZA VIRUS 1-RVPPCR: NOT DETECTED
PARAINFLUENZA VIRUS 2-RVPPCR: NOT DETECTED
PARAINFLUENZA VIRUS 3-RVPPCR: NOT DETECTED
Parainfluenza Virus 4: NOT DETECTED
RHINOVIRUS / ENTEROVIRUS - RVPPCR: NOT DETECTED
Respiratory Syncytial Virus: NOT DETECTED

## 2017-09-05 LAB — COMPREHENSIVE METABOLIC PANEL
ALBUMIN: 3.8 g/dL (ref 3.5–5.0)
ALK PHOS: 85 U/L (ref 38–126)
ALT: 21 U/L (ref 0–44)
AST: 35 U/L (ref 15–41)
Anion gap: 12 (ref 5–15)
BILIRUBIN TOTAL: 1.1 mg/dL (ref 0.3–1.2)
BUN: 12 mg/dL (ref 6–20)
CALCIUM: 9.4 mg/dL (ref 8.9–10.3)
CO2: 26 mmol/L (ref 22–32)
Chloride: 101 mmol/L (ref 98–111)
Creatinine, Ser: 0.94 mg/dL (ref 0.44–1.00)
GFR calc Af Amer: >60 mL/min (ref >60)
GFR calc non Af Amer: >60 mL/min (ref >60)
GLUCOSE: 192 mg/dL — AB (ref 70–99)
Potassium: 4.9 mmol/L (ref 3.5–5.1)
SODIUM: 139 mmol/L (ref 135–145)
TOTAL PROTEIN: 7.2 g/dL (ref 6.5–8.1)

## 2017-09-05 LAB — CBC WITH DIFFERENTIAL/PLATELET
ABS IMMATURE GRANULOCYTES: 0 10*3/uL (ref 0.0–0.1)
BASOS ABS: 0 10*3/uL (ref 0.0–0.1)
BASOS PCT: 0 %
EOS PCT: 1 %
Eosinophils Absolute: 0.1 10*3/uL (ref 0.0–0.7)
HCT: 44.1 % (ref 36.0–46.0)
HEMOGLOBIN: 13.4 g/dL (ref 12.0–15.0)
Immature Granulocytes: 0 %
Lymphocytes Relative: 6 %
Lymphs Abs: 0.6 10*3/uL — ABNORMAL LOW (ref 0.7–4.0)
MCH: 25.4 pg — AB (ref 26.0–34.0)
MCHC: 30.4 g/dL (ref 30.0–36.0)
MCV: 83.5 fL (ref 78.0–100.0)
MONO ABS: 0.6 10*3/uL (ref 0.1–1.0)
MONOS PCT: 6 %
NEUTROS ABS: 8.7 10*3/uL — AB (ref 1.7–7.7)
Neutrophils Relative %: 87 %
PLATELETS: ADEQUATE 10*3/uL (ref 150–400)
RBC: 5.28 MIL/uL — ABNORMAL HIGH (ref 3.87–5.11)
RDW: 16.5 % — ABNORMAL HIGH (ref 11.5–15.5)
WBC: 10.1 10*3/uL (ref 4.0–10.5)

## 2017-09-05 LAB — PROTEIN AND GLUCOSE, CSF
Glucose, CSF: 101 mg/dL — ABNORMAL HIGH (ref 40–70)
TOTAL PROTEIN, CSF: 34 mg/dL (ref 15–45)

## 2017-09-05 LAB — CBG MONITORING, ED: GLUCOSE-CAPILLARY: 152 mg/dL — AB (ref 70–99)

## 2017-09-05 LAB — CSF CELL COUNT WITH DIFFERENTIAL
RBC COUNT CSF: 110 /mm3 — AB
TUBE #: 1
WBC, CSF: 1 /mm3 (ref 0–5)

## 2017-09-05 LAB — URINALYSIS, ROUTINE W REFLEX MICROSCOPIC
BILIRUBIN URINE: NEGATIVE
GLUCOSE, UA: NEGATIVE mg/dL
HGB URINE DIPSTICK: NEGATIVE
Ketones, ur: NEGATIVE mg/dL
Leukocytes, UA: NEGATIVE
Nitrite: NEGATIVE
Protein, ur: NEGATIVE mg/dL
SPECIFIC GRAVITY, URINE: 1.021 (ref 1.005–1.030)
pH: 7 (ref 5.0–8.0)

## 2017-09-05 LAB — HEMOGLOBIN A1C
Hgb A1c MFr Bld: 9.4 % — ABNORMAL HIGH (ref 4.8–5.6)
Mean Plasma Glucose: 223.08 mg/dL

## 2017-09-05 LAB — GLUCOSE, CAPILLARY
GLUCOSE-CAPILLARY: 112 mg/dL — AB (ref 70–99)
GLUCOSE-CAPILLARY: 147 mg/dL — AB (ref 70–99)

## 2017-09-05 LAB — CRYPTOCOCCAL ANTIGEN, CSF: CRYPTO AG: NEGATIVE

## 2017-09-05 LAB — I-STAT CG4 LACTIC ACID, ED
LACTIC ACID, VENOUS: 1.09 mmol/L (ref 0.5–1.9)
Lactic Acid, Venous: 2.03 mmol/L (ref 0.5–1.9)

## 2017-09-05 MED ORDER — VANCOMYCIN HCL 10 G IV SOLR
1750.0000 mg | Freq: Two times a day (BID) | INTRAVENOUS | Status: DC
  Administered 2017-09-05: 1750 mg via INTRAVENOUS
  Filled 2017-09-05 (×3): qty 1750

## 2017-09-05 MED ORDER — METRONIDAZOLE IN NACL 5-0.79 MG/ML-% IV SOLN
500.0000 mg | Freq: Three times a day (TID) | INTRAVENOUS | Status: DC
  Administered 2017-09-05: 500 mg via INTRAVENOUS
  Filled 2017-09-05: qty 100

## 2017-09-05 MED ORDER — HYDROCODONE-ACETAMINOPHEN 5-325 MG PO TABS
1.0000 | ORAL_TABLET | ORAL | Status: DC | PRN
  Filled 2017-09-05: qty 2

## 2017-09-05 MED ORDER — ALBUTEROL SULFATE (2.5 MG/3ML) 0.083% IN NEBU: 2.5000 mg | INHALATION_SOLUTION | RESPIRATORY_TRACT | Status: DC | PRN

## 2017-09-05 MED ORDER — ONDANSETRON HCL 4 MG/2ML IJ SOLN
4.0000 mg | Freq: Once | INTRAMUSCULAR | Status: AC
  Administered 2017-09-05: 4 mg via INTRAVENOUS
  Filled 2017-09-05: qty 2

## 2017-09-05 MED ORDER — FOLIC ACID 1 MG PO TABS
1.0000 mg | ORAL_TABLET | Freq: Every day | ORAL | Status: DC
  Administered 2017-09-05 – 2017-09-07 (×3): 1 mg via ORAL
  Filled 2017-09-05 (×3): qty 1

## 2017-09-05 MED ORDER — OXYCODONE HCL 5 MG PO TABS
10.0000 mg | ORAL_TABLET | Freq: Every day | ORAL | Status: DC
  Administered 2017-09-05: 10 mg via ORAL
  Filled 2017-09-05 (×2): qty 2

## 2017-09-05 MED ORDER — INSULIN ASPART 100 UNIT/ML ~~LOC~~ SOLN
0.0000 [IU] | Freq: Three times a day (TID) | SUBCUTANEOUS | Status: DC
  Administered 2017-09-05: 3 [IU] via SUBCUTANEOUS
  Administered 2017-09-06: 4 [IU] via SUBCUTANEOUS
  Administered 2017-09-06 – 2017-09-07 (×4): 7 [IU] via SUBCUTANEOUS
  Administered 2017-09-07: 4 [IU] via SUBCUTANEOUS

## 2017-09-05 MED ORDER — LIDOCAINE HCL (PF) 1 % IJ SOLN
5.0000 mL | Freq: Once | INTRAMUSCULAR | Status: AC
  Administered 2017-09-05: 10 mL via INTRADERMAL

## 2017-09-05 MED ORDER — GABAPENTIN 300 MG PO CAPS
300.0000 mg | ORAL_CAPSULE | Freq: Three times a day (TID) | ORAL | Status: DC
  Administered 2017-09-05 – 2017-09-07 (×8): 300 mg via ORAL
  Filled 2017-09-05 (×8): qty 1

## 2017-09-05 MED ORDER — KETOROLAC TROMETHAMINE 30 MG/ML IJ SOLN
30.0000 mg | Freq: Four times a day (QID) | INTRAMUSCULAR | Status: DC | PRN
  Administered 2017-09-05 – 2017-09-07 (×2): 30 mg via INTRAVENOUS
  Filled 2017-09-05 (×2): qty 1

## 2017-09-05 MED ORDER — LEVOTHYROXINE SODIUM 75 MCG PO TABS
75.0000 ug | ORAL_TABLET | Freq: Every day | ORAL | Status: DC
Start: 2017-09-06 — End: 2017-09-07
  Administered 2017-09-06 – 2017-09-07 (×2): 75 ug via ORAL
  Filled 2017-09-05 (×2): qty 1

## 2017-09-05 MED ORDER — ALPRAZOLAM 0.5 MG PO TABS
1.0000 mg | ORAL_TABLET | Freq: Three times a day (TID) | ORAL | Status: DC | PRN
  Administered 2017-09-05: 1 mg via ORAL
  Filled 2017-09-05 (×2): qty 2

## 2017-09-05 MED ORDER — INSULIN GLARGINE 100 UNIT/ML ~~LOC~~ SOLN
80.0000 [IU] | Freq: Two times a day (BID) | SUBCUTANEOUS | Status: DC
Start: 2017-09-05 — End: 2017-09-07
  Administered 2017-09-05 – 2017-09-07 (×5): 80 [IU] via SUBCUTANEOUS
  Filled 2017-09-05 (×6): qty 0.8

## 2017-09-05 MED ORDER — ASPIRIN EC 325 MG PO TBEC
325.0000 mg | DELAYED_RELEASE_TABLET | Freq: Every day | ORAL | Status: DC
  Administered 2017-09-05 – 2017-09-06 (×2): 325 mg via ORAL
  Filled 2017-09-05 (×2): qty 1

## 2017-09-05 MED ORDER — ACETAMINOPHEN 650 MG RE SUPP: 650.0000 mg | Freq: Four times a day (QID) | RECTAL | Status: DC | PRN

## 2017-09-05 MED ORDER — HYDROMORPHONE HCL 1 MG/ML IJ SOLN
1.0000 mg | INTRAMUSCULAR | Status: DC | PRN
  Administered 2017-09-05: 1 mg via INTRAVENOUS
  Filled 2017-09-05: qty 1

## 2017-09-05 MED ORDER — SENNOSIDES-DOCUSATE SODIUM 8.6-50 MG PO TABS: 1.0000 | ORAL_TABLET | Freq: Every evening | ORAL | Status: DC | PRN

## 2017-09-05 MED ORDER — ONDANSETRON HCL 4 MG PO TABS: 4.0000 mg | ORAL_TABLET | Freq: Four times a day (QID) | ORAL | Status: DC | PRN

## 2017-09-05 MED ORDER — METHOCARBAMOL 500 MG PO TABS
500.0000 mg | ORAL_TABLET | Freq: Two times a day (BID) | ORAL | Status: DC
  Administered 2017-09-05 – 2017-09-07 (×5): 500 mg via ORAL
  Filled 2017-09-05 (×5): qty 1

## 2017-09-05 MED ORDER — ONDANSETRON HCL 4 MG/2ML IJ SOLN: 4.0000 mg | Freq: Four times a day (QID) | INTRAMUSCULAR | Status: DC | PRN

## 2017-09-05 MED ORDER — DEXTROSE 5 % IV SOLN
1200.0000 mg | Freq: Three times a day (TID) | INTRAVENOUS | Status: DC
  Administered 2017-09-05 – 2017-09-06 (×3): 1200 mg via INTRAVENOUS
  Filled 2017-09-05: qty 24
  Filled 2017-09-05: qty 10
  Filled 2017-09-05: qty 24
  Filled 2017-09-05: qty 20

## 2017-09-05 MED ORDER — SERTRALINE HCL 100 MG PO TABS
200.0000 mg | ORAL_TABLET | Freq: Every day | ORAL | Status: DC
  Administered 2017-09-05 – 2017-09-07 (×3): 200 mg via ORAL
  Filled 2017-09-05 (×3): qty 2

## 2017-09-05 MED ORDER — KETOROLAC TROMETHAMINE 30 MG/ML IJ SOLN
30.0000 mg | Freq: Once | INTRAMUSCULAR | Status: AC
  Administered 2017-09-05: 30 mg via INTRAVENOUS
  Filled 2017-09-05: qty 1

## 2017-09-05 MED ORDER — VANCOMYCIN HCL 10 G IV SOLR
2000.0000 mg | Freq: Once | INTRAVENOUS | Status: AC
  Administered 2017-09-05: 2000 mg via INTRAVENOUS
  Filled 2017-09-05: qty 2000

## 2017-09-05 MED ORDER — VANCOMYCIN HCL IN DEXTROSE 1-5 GM/200ML-% IV SOLN: 1000.0000 mg | Freq: Once | INTRAVENOUS | Status: DC

## 2017-09-05 MED ORDER — SODIUM CHLORIDE 0.9 % IV BOLUS
1000.0000 mL | Freq: Once | INTRAVENOUS | Status: AC
  Administered 2017-09-05: 1000 mL via INTRAVENOUS

## 2017-09-05 MED ORDER — BUPRENORPHINE 15 MCG/HR TD PTWK: 1.0000 | MEDICATED_PATCH | TRANSDERMAL | Status: DC

## 2017-09-05 MED ORDER — SODIUM CHLORIDE 0.9 % IV SOLN
2.0000 g | Freq: Once | INTRAVENOUS | Status: AC
  Administered 2017-09-05: 2 g via INTRAVENOUS
  Filled 2017-09-05: qty 2

## 2017-09-05 MED ORDER — BISACODYL 10 MG RE SUPP: 10.0000 mg | Freq: Every day | RECTAL | Status: DC | PRN

## 2017-09-05 MED ORDER — HEPARIN SODIUM (PORCINE) 5000 UNIT/ML IJ SOLN: 5000.0000 [IU] | Freq: Three times a day (TID) | INTRAMUSCULAR | Status: DC

## 2017-09-05 MED ORDER — ACETAMINOPHEN 325 MG PO TABS
650.0000 mg | ORAL_TABLET | Freq: Four times a day (QID) | ORAL | Status: DC | PRN
  Administered 2017-09-05 – 2017-09-06 (×2): 650 mg via ORAL
  Filled 2017-09-05 (×3): qty 2

## 2017-09-05 MED ORDER — SODIUM CHLORIDE 0.9 % IV SOLN
2.0000 g | Freq: Two times a day (BID) | INTRAVENOUS | Status: DC
  Administered 2017-09-05 – 2017-09-06 (×3): 2 g via INTRAVENOUS
  Filled 2017-09-05 (×3): qty 20

## 2017-09-05 MED ORDER — SODIUM CHLORIDE 0.9 % IV SOLN
1.0000 g | Freq: Three times a day (TID) | INTRAVENOUS | Status: DC
  Filled 2017-09-05: qty 1

## 2017-09-05 NOTE — ED Notes (Signed)
Pt c/o burning at IV site in R arm; Rocephin paused; upon assessment, IV found to have been mostly pulled out during transport; IV removed, pharmacy consulted; ice pack placed at IV site per pharmacy; no redness, swelling, or unusual temperature noted to arm

## 2017-09-05 NOTE — ED Notes (Signed)
Pt transported to IR 

## 2017-09-05 NOTE — H&P (Addendum)
History and Physical    Cheyenne Guzman MRN:[id number] DOB: 12/30/1965 DOA: 09/05/2017   PCP: Fry, Stephen A, MD   Patient coming from:  Home    Chief Complaint: Headaches, nausea and vomiting, neck pain  HPI: Cheyenne Guzman is a 51 y.o. female with medical history significant for morbid obesity, remote history of breast cancer,  Arthritis, diabetes type 2 insulin-dependent,  pyelonephritis, urosepsis, and a recent history of right lower extremity cellulitis treated with Keflex, presenting with "horrible headache "accompanied by nausea, and vomiting, as well as neck pain.  The patient states that she does have a history of chronic headaches, but this is very unusual for her.  She also "felt confused "prior to being admitted.  She had intermittent fevers, going from "97" to 101.5 prior to presentation.  She is not aware of any sick contacts, however, the daughter reports that she has been at the pool, and had "algae" and did not look clean.  She denies any vision changes, or photophobia.  No dysphagia.  She denies any chest pain or palpitations.  She denies any shortness of breath or cough.  The patient denies any abdominal pain, or diarrhea.  She denies any dysuria or gross hematuria.  As for her legs, she denies any worsening leg swelling, and the redness in her right lower extremity is improving according to her.  She denies any history of PE or DVT.  Denies any changes in medications.  ED Course:  BP 131/63   Pulse 96   Temp 99.6 F (37.6 C) (Oral)   Resp 19   Ht 5' 7" (1.702 m)   Wt (!) 192.8 kg   SpO2 98%   BMI 66.56 kg/m   Glucose 255 Creatinine 1 Platelets 148 White count 10.1 Lactic acid is normal Initially was thought the patient was being treated for sepsis due to cellulitis of the right lower extremity, and receive IV vancomycin, as well as Flagyl for undetermined cause of sepsis at the ER. At this time, as this appeared to be of viral origin, and meningitis is being rule out,  CT of the head has been ordered, with results pending. LFTs are normal, creatinine normal, UA negative. LP is being ordered   Review of Systems:  As per HPI otherwise all other systems reviewed and are negative  Past Medical History:  Diagnosis Date  . Anxiety    per pt. - mild   . Arthritis    back & ankles   . Asthma    seasonal   . Breast mass in female   . Cancer (HCC)    breast, sees Dr. Magrinat , Left - 11/2007  . Complication of anesthesia 2013   severe muscle spasms-sore-no doc  . Depression   . Diabetes mellitus    sees Dr. Balan, diagnosed 2003  . HA (headache)   . Hearing loss    Went to [facility],Dr Laing,both ears  . Hyperlipidemia   . Kidney infection    - hosp. [facility]- septic- 10/2009  . Low back pain   . Peripheral neuropathy    R sided numbness- face & jaw  . Sleep apnea 9/15   severe-just started cpap-doing well  . Stroke (HCC)    tia-no deficit  . TIA (transient ischemic attack)    in 2007,2008, and 2011  . Tinnitus of right ear   . Urosepsis 08-2009   with Klebsiella    Past Surgical History:  Procedure Laterality Date  .   ABDOMINAL HYSTERECTOMY  08/2004  . BREAST BIOPSY  06/22/2011   Procedure: BREAST BIOPSY;  Surgeon: Faera Byerly, MD;  Location: [facility] OR;  Service: General;  Laterality: Right;  . BREAST SURGERY  11/2007   left ductal carcinoma in situ  . CARPAL TUNNEL RELEASE Right 10/13/2013   Procedure: RIGHT CARPAL TUNNEL RELEASE;  Surgeon: Peter G Dalldorf, MD;  Location: [facility];  Service: Orthopedics;  Laterality: Right;  . CARPAL TUNNEL RELEASE Left 11/24/2013   Procedure: LEFT CARPAL TUNNEL RELEASE WITH CYST EXCISION ;  Surgeon: Peter G Dalldorf, MD;  Location: [facility];  Service: Orthopedics;  Laterality: Left;  . EAR CYST EXCISION Left 11/24/2013   Procedure: CYST REMOVAL;  Surgeon: Peter G Dalldorf, MD;  Location: [facility];  Service: Orthopedics;  Laterality: Left;  .  FOOT SURGERY  2000   plantar fasciitis-left  . LUMBAR DISC SURGERY  03-29-14   Fusion, revision 04-27-14 L4-5 Cauda Equina with graft  . NERVE GRAFT  06-01-08   cadaver graft to right inferior alveolar nerve  in [place] -[place]    Social History Social History   Socioeconomic History  . Marital status: Married    Spouse name: Marshall  . Number of children: 2  . Years of education: 14  . Highest education level: Not on file  Occupational History    Comment: [employer]  Social Needs  . Financial resource strain: Not on file  . Food insecurity:    Worry: Not on file    Inability: Not on file  . Transportation needs:    Medical: Not on file    Non-medical: Not on file  Tobacco Use  . Smoking status: Former Smoker    Last attempt to quit: 02/18/1997    Years since quitting: 20.5  . Smokeless tobacco: Never Used  Substance and Sexual Activity  . Alcohol use: Yes    Comment: rare  . Drug use: No  . Sexual activity: Not on file  Lifestyle  . Physical activity:    Days per week: Not on file    Minutes per session: Not on file  . Stress: Not on file  Relationships  . Social connections:    Talks on phone: Not on file    Gets together: Not on file    Attends religious service: Not on file    Active member of club or organization: Not on file    Attends meetings of clubs or organizations: Not on file    Relationship status: Not on file  . Intimate partner violence:    Fear of current or ex partner: Not on file    Emotionally abused: Not on file    Physically abused: Not on file    Forced sexual activity: Not on file  Other Topics Concern  . Not on file  Social History Narrative   Patient lives at home with her husband (Marshall) and two daughters.    Patient works for [employer] full time patient is on medical leave at this time.   Education two years of college.   Right handed.   Caffeine two times per day.     No Known Allergies  Family History  Problem Relation  Age of Onset  . Cancer Mother        breast  . Cancer Father        lung  . Diabetes Father   . Cancer Sister        colon  .   Cancer Maternal Aunt        breast - both  . Diabetes Sister   . Anesthesia problems Neg Hx        Prior to Admission medications   Medication Sig Start Date End Date Taking? Authorizing Provider  albuterol (PROVENTIL HFA;VENTOLIN HFA) 108 (90 Base) MCG/ACT inhaler Inhale 2 puffs into the lungs every 4 (four) hours as needed for wheezing or shortness of breath. 04/03/17  Yes Fry, Stephen A, MD  albuterol (PROVENTIL) (2.5 MG/3ML) 0.083% nebulizer solution 1 VIAL IN NEBULIZER EVERY 4 HOURS AS NEEDED FOR WHEEZING 10/13/13  Yes Fry, Stephen A, MD  ALPRAZolam (XANAX) 1 MG tablet TAKE 1 TABLET BY MOUTH 3 TIMES A DAY Patient taking differently: Take 1 mg by mouth 3 (three) times daily as needed for anxiety.  06/04/17  Yes Fry, Stephen A, MD  aspirin 325 MG EC tablet Take 325 mg by mouth at bedtime. Reported on 04/19/2015   Yes [provider]  Buprenorphine 15 MCG/HR PTWK Place 1 patch onto the skin once a week.  07/23/17  Yes [provider]  furosemide (LASIX) 40 MG tablet TAKE 1 TABLET (40 MG TOTAL) BY MOUTH DAILY. AS NEEDED FOR SWELLING. Patient taking differently: Take 40 mg by mouth daily.  02/28/16  Yes Fry, Stephen A, MD  gabapentin (NEURONTIN) 300 MG capsule Take 300 mg by mouth 3 (three) times daily.    Yes [provider]  Insulin Glargine (LANTUS SOLOSTAR) 100 UNIT/ML Solostar Pen Inject 250 Units into the skin every morning. and pen needles 3/day 08/14/17  Yes Ellison, Sean, MD  levothyroxine (SYNTHROID, LEVOTHROID) 75 MCG tablet Take 1 tablet (75 mcg total) by mouth daily. 05/28/17  Yes Fry, Stephen A, MD  methocarbamol (ROBAXIN) 750 MG tablet Take 1 tablet (750 mg total) by mouth 2 (two) times daily. Patient taking differently: Take 750 mg by mouth 3 (three) times daily.  02/03/15  Yes Fry, Stephen A, MD  Oxycodone HCl 10 MG TABS Take 10 mg  by mouth daily as needed (breakthrough pain).    Yes [provider]  sertraline (ZOLOFT) 100 MG tablet TAKE 2 TABLETS (200 MG TOTAL) BY MOUTH AT BEDTIME. Patient taking differently: Take 200 mg by mouth daily.  05/15/17  Yes Fry, Stephen A, MD  Blood Glucose Monitoring Suppl (CONTOUR NEXT MONITOR) w/Device KIT 1 each by Does not apply route 2 (two) times daily. To check blood sugars twice a day. 08/27/16   Ellison, Sean, MD  folic acid (FOLVITE) 1 MG tablet Take 1 mg by mouth daily. 09/03/17   [provider]  glucose blood (CONTOUR NEXT TEST) test strip Use to test blood sugar 2 times daily 12/18/16   Ellison, Sean, MD  glucose blood test strip Use as instructed 08/24/16   Ellison, Sean, MD  Methotrexate Sodium (METHOTREXATE, PF,) 50 MG/2ML injection  09/03/17   [provider]     Physical Exam:  Vitals:   09/05/17 0845 09/05/17 0900 09/05/17 0915 09/05/17 0924  BP: 113/63  131/63   Pulse: 100 96  96  Resp: 16 15  19  Temp:      TempSrc:      SpO2: 99% 97%  98%  Weight:      Height:       Constitutional: Uncomfortable, ill appearing, diaphoretic.  Anxious appearing Eyes: PERRL, lids and conjunctivae normal ENMT: Mucous membranes are moist, without exudate or lesions  Neck: normal, supple, no masses, no thyromegaly Respiratory: clear to auscultation bilaterally,   no wheezing, no crackles. Normal respiratory effort  Cardiovascular: Tachycardicrate and rhythm,  murmur, rubs or gallops. No extremity edema. 2+ pedal pulses. No carotid bruits.  Abdomen: Soft, morbidly obese, non tender, No hepatosplenomegaly. Bowel sounds positive.  Musculoskeletal: no clubbing / cyanosis. Moves all extremities.  Cannot reproduce meningeal signs with leg flex.   Skin: no jaundice, No lesions.  Right lower extremity erythema is improving Neurologic: Sensation intact  Strength equal in all extremities Psychiatric:   Alert and oriented x 3.  Anxious   Labs on Admission: I have  personally reviewed following labs and imaging studies  CBC: Recent Labs  Lab 09/05/17 0500  WBC 10.1  NEUTROABS 8.7*  HGB 13.4  HCT 44.1  MCV 83.5  PLT PLATELET CLUMPS NOTED ON SMEAR, COUNT APPEARS ADEQUATE    Basic Metabolic Panel: Recent Labs  Lab 09/05/17 0500  NA 139  K 4.9  CL 101  CO2 26  GLUCOSE 192*  BUN 12  CREATININE 0.94  CALCIUM 9.4    GFR: Estimated Creatinine Clearance: 126.1 mL/min (by C-G formula based on SCr of 0.94 mg/dL).  Liver Function Tests: Recent Labs  Lab 09/05/17 0500  AST 35  ALT 21  ALKPHOS 85  BILITOT 1.1  PROT 7.2  ALBUMIN 3.8   No results for input(s): LIPASE, AMYLASE in the last 168 hours. No results for input(s): AMMONIA in the last 168 hours.  Coagulation Profile: No results for input(s): INR, PROTIME in the last 168 hours.  Cardiac Enzymes: No results for input(s): CKTOTAL, CKMB, CKMBINDEX, TROPONINI in the last 168 hours.  BNP (last 3 results) No results for input(s): PROBNP in the last 8760 hours.  HbA1C: No results for input(s): HGBA1C in the last 72 hours.  CBG: No results for input(s): GLUCAP in the last 168 hours.  Lipid Profile: No results for input(s): CHOL, HDL, LDLCALC, TRIG, CHOLHDL, LDLDIRECT in the last 72 hours.  Thyroid Function Tests: No results for input(s): TSH, T4TOTAL, FREET4, T3FREE, THYROIDAB in the last 72 hours.  Anemia Panel: No results for input(s): VITAMINB12, FOLATE, FERRITIN, TIBC, IRON, RETICCTPCT in the last 72 hours.  Urine analysis:    Component Value Date/Time   COLORURINE YELLOW 09/05/2017 0607   APPEARANCEUR CLEAR 09/05/2017 0607   APPEARANCEUR Clear 07/31/2013 1404   LABSPEC 1.021 09/05/2017 0607   PHURINE 7.0 09/05/2017 0607   GLUCOSEU NEGATIVE 09/05/2017 0607   HGBUR NEGATIVE 09/05/2017 0607   HGBUR negative 09/28/2009 1044   BILIRUBINUR NEGATIVE 09/05/2017 0607   BILIRUBINUR Positive 08/09/2017 1702   BILIRUBINUR Negative 07/31/2013 1404   KETONESUR NEGATIVE  09/05/2017 0607   PROTEINUR NEGATIVE 09/05/2017 0607   UROBILINOGEN 1.0 08/09/2017 1702   UROBILINOGEN 0.2 07/03/2012 1027   NITRITE NEGATIVE 09/05/2017 0607   LEUKOCYTESUR NEGATIVE 09/05/2017 0607   LEUKOCYTESUR Negative 07/31/2013 1404    Sepsis Labs: _0 (procalcitonin:4,lacticidven:4) )No results found for this or any previous visit (from the past 240 hour(s)).   Radiological Exams on Admission: Dg Chest Port 1 View  Result Date: 09/05/2017 CLINICAL DATA:  Sudden fever spike. Pain in the neck and back. Recent treatment for cellulitis. Shortness of breath. EXAM: PORTABLE CHEST 1 VIEW COMPARISON:  09/10/2016 FINDINGS: Shallow inspiration. Linear atelectasis in the lung bases. Cardiac enlargement. Pulmonary vascularity is normal. No airspace disease or consolidation in the lungs. No blunting of costophrenic angles. No pneumothorax. Mediastinal contours appear intact. IMPRESSION: Shallow inspiration with linear atelectasis in the lung bases. Cardiac enlargement. No focal consolidation. Electronically Signed   By: Gwyndolyn Saxon  Stevens M.D.   On: 09/05/2017 06:17    EKG: Independently reviewed.  Assessment/Plan Principal Problem:   Acute viral syndrome Active Problems:   Cellulitis of right leg   Anxiety state   Asthma   Morbid obesity (HCC)   Obstructive sleep apnea   Migraine   Hypothyroidism   Presumed meningitis patient presents with viral symptoms, including intermittent fever, spiking to 101.5, currently 100.9, nausea, vomiting, diaphoresis, and neck pain.  No mental status changes.  White count is normal at 10.1.  Normal lactic acid. Admit to MedSurg for now, if positive for meningitis, the patient will be transferred to stepdown as inpatient. Meningitis order set Antibiotics with vancomycin and Rocephin per pharmacy LP, follow results Toradol for pain Antiemetics PRN Check HIV Seizure precautions  Anxiety and depression Continue Xanax 3 times daily as needed and  Zoloft  Type II Diabetes Current blood sugar level is near 200  Lab Results  Component Value Date   HGBA1C 10.3 (A) 08/14/2017   Patient takes Lantus at 250 mg daily.  Spoke with pharmacist regarding this very high dose.  He discussed with patient, who reported "really not taking that much", apparently she has trouble understanding the instructions, and she has been taking about at most 100 mg a day.  Pharmacy recommended that the patient at this time takes 80 mg twice daily, and have diabetes coordinator evaluate the patient for better management of her blood sugars, and to explain the proper way of administering the medicine. Sliding scale insulin, will continue at resistant Continue Neurontin  History of asthma, no wheezing on exam OSA  Continue home Proventil at every 4 hours as needed for shortness of breath and wheezing Continue CPAP  Hypothyroidism: Continue home Synthroid  History of chronic pain syndrome, continue home medications with oxycodone as needed, muscle relaxers, Neurontin Continue view brain [place] pain weekly, due for September 08, 2017 if the patient remains in the hospital. She is on MTX weekly, not ordered at this time      DVT prophylaxis: SCDs for now in preparation for LP Code Status:    Full code Family Communication:  Discussed with patient Disposition Plan: Expect patient to be discharged to home after condition improves Consults called:    None Admission status: Observation MedSurg  Sara Wertman, PA-C [facility]   Amion text  [phone]   09/05/2017, 9:49 AM    

## 2017-09-05 NOTE — ED Provider Notes (Addendum)
[facility] EMERGENCY DEPARTMENT Provider Note   CSN: [id number] Arrival date & time: 09/05/17  0448     History   Chief Complaint No chief complaint on file.   HPI Cheyenne Guzman is a 52 y.o. female.  The history is provided by the patient and the EMS personnel. The history is limited by the condition of the patient.  Fever   This is a new problem. The current episode started less than 1 hour ago. The problem occurs constantly. The problem has not changed since onset.The maximum temperature noted was 101 to 101.9 F. The temperature was taken using an oral thermometer. Associated symptoms include vomiting and headaches. Pertinent negatives include no chest pain, no fussiness, no sleepiness, no diarrhea, no congestion, no sore throat, no tugging at ear, no muscle aches and no cough. She has tried nothing for the symptoms. The treatment provided no relief.  Patient with h/o DM and recent cellulitis having completed keflex presents with fever and n/v and concern for urosepsis per the patient.  One episode of emesis en route following tylenol.  Patient states when she feels this way she always has a bad UTI. No neck pain or stiffness.  Still nauseated.  No abdominal pain.  No cough no sore throat.    Past Medical History:  Diagnosis Date  . Anxiety    per pt. - mild   . Arthritis    back & ankles   . Asthma    seasonal   . Breast mass in female   . Cancer [facility])    breast, sees Dr. Jana Hakim , Left - 11/2007  . Complication of anesthesia 2013   severe muscle spasms-sore-no doc  . Depression   . Diabetes mellitus    sees Dr. Chalmers Cater, diagnosed 2003  . HA (headache)   . Hearing loss    Went to [facility]  . Hyperlipidemia   . Kidney infection    - hosp. [facility]- septic- 10/2009  . Low back pain   . Peripheral neuropathy    R sided numbness- face & jaw  . Sleep apnea 9/15   severe-just started cpap-doing well  . Stroke [facility])      tia-no deficit  . TIA (transient ischemic attack)    in 2007,2008, and 2011  . Tinnitus of right ear   . Urosepsis 08-2009   with Klebsiella    Patient Active Problem List   Diagnosis Date Noted  . Hypothyroidism 08/14/2017  . Edema of both legs 10/18/2015  . Migraine 06/16/2014  . Lumbar radiculopathy 03/18/2014  . Diabetic neuropathy ([place]) 01/07/2014  . Congenital spondylolysis of lumbosacral region 11/02/2013  . SPL (spondylolisthesis) 10/08/2013  . Chronic LBP 10/08/2013  . Obstructive sleep apnea 07/31/2013  . HA (headache)   . Tinnitus 06/23/2013  . Hearing loss 06/23/2013  . Headache(784.0) 06/23/2013  . Low back pain 06/10/2013  . SOB (shortness of breath) 11/27/2012  . Chest pain 11/27/2012  . Cellulitis of left leg 07/03/2012  . Morbid obesity ([place]) 07/03/2012  . Abdominal pain 02/01/2012  . Breast mass, right 07/13/2011  . DCIS (ductal carcinoma in situ) of breast, left, treated with [facility] 11/2007 02/19/2011  . TIA 10/13/2009  . BACK PAIN, LUMBAR 10/13/2009  . PYELONEPHRITIS 09/09/2009  . Urinary tract infection, site not specified 08/16/2008  . ASTHMA 02/20/2008  . CANDIDIASIS 08/28/2007  . Diabetes mellitus without complication ([place]) 36/14/4315  . HYPERLIPIDEMIA 11/26/2006  . ANXIETY 11/26/2006  .  DEPRESSION 11/26/2006  . ACUTE BRONCHITIS 11/26/2006    Past Surgical History:  Procedure Laterality Date  . ABDOMINAL HYSTERECTOMY  08/2004  . BREAST BIOPSY  06/22/2011   Procedure: BREAST BIOPSY;  Surgeon: Stark Klein, MD;  Location: [place];  Service: General;  Laterality: Right;  . BREAST SURGERY  11/2007   left ductal carcinoma in situ  . CARPAL TUNNEL RELEASE Right 10/13/2013   Procedure: RIGHT CARPAL TUNNEL RELEASE;  Surgeon: Hessie Dibble, MD;  Location: [place];  Service: Orthopedics;  Laterality: Right;  . CARPAL TUNNEL RELEASE Left 11/24/2013   Procedure: LEFT CARPAL TUNNEL RELEASE WITH CYST EXCISION ;  Surgeon: Hessie Dibble,  MD;  Location: [place];  Service: Orthopedics;  Laterality: Left;  . EAR CYST EXCISION Left 11/24/2013   Procedure: CYST REMOVAL;  Surgeon: Hessie Dibble, MD;  Location: [place];  Service: Orthopedics;  Laterality: Left;  . FOOT SURGERY  2000   plantar fasciitis-left  . LUMBAR DISC SURGERY  03-29-14   Fusion, revision 04-27-14 L4-5 Cauda Equina with graft  . NERVE GRAFT  06-01-08   cadaver graft to right inferior alveolar nerve  in [place]     OB History   None      Home Medications    Prior to Admission medications   Medication Sig Start Date End Date Taking? Authorizing Provider  albuterol (PROVENTIL HFA;VENTOLIN HFA) 108 (90 Base) MCG/ACT inhaler Inhale 2 puffs into the lungs every 4 (four) hours as needed for wheezing or shortness of breath. 04/03/17  Yes Laurey Morale, MD  albuterol (PROVENTIL) (2.5 MG/3ML) 0.083% nebulizer solution 1 VIAL IN NEBULIZER EVERY 4 HOURS AS NEEDED FOR WHEEZING 10/13/13  Yes Laurey Morale, MD  ALPRAZolam Duanne Moron) 1 MG tablet TAKE 1 TABLET BY MOUTH 3 TIMES A DAY Patient taking differently: Take 1 mg by mouth 3 (three) times daily as needed for anxiety.  06/04/17  Yes Laurey Morale, MD  aspirin 325 MG EC tablet Take 325 mg by mouth at bedtime. Reported on 04/19/2015   Yes [provider]  Buprenorphine 15 MCG/HR PTWK Place 1 patch onto the skin once a week.  07/23/17  Yes [provider]  furosemide (LASIX) 40 MG tablet TAKE 1 TABLET (40 MG TOTAL) BY MOUTH DAILY. AS NEEDED FOR SWELLING. Patient taking differently: Take 40 mg by mouth daily.  02/28/16  Yes Laurey Morale, MD  gabapentin (NEURONTIN) 300 MG capsule Take 300 mg by mouth 3 (three) times daily.    Yes [provider]  Insulin Glargine (LANTUS SOLOSTAR) 100 UNIT/ML Solostar Pen Inject 250 Units into the skin every morning. and pen needles 3/day 08/14/17  Yes Renato Shin, MD  levothyroxine (SYNTHROID, LEVOTHROID) 75 MCG tablet Take 1  tablet (75 mcg total) by mouth daily. 05/28/17  Yes Laurey Morale, MD  methocarbamol (ROBAXIN) 750 MG tablet Take 1 tablet (750 mg total) by mouth 2 (two) times daily. Patient taking differently: Take 750 mg by mouth 3 (three) times daily.  02/03/15  Yes Laurey Morale, MD  Oxycodone HCl 10 MG TABS Take 10 mg by mouth daily as needed (breakthrough pain).    Yes [provider]  sertraline (ZOLOFT) 100 MG tablet TAKE 2 TABLETS (200 MG TOTAL) BY MOUTH AT BEDTIME. Patient taking differently: Take 200 mg by mouth daily.  05/15/17  Yes Laurey Morale, MD  Blood Glucose Monitoring Suppl (CONTOUR NEXT MONITOR) w/Device KIT 1 each by  Does not apply route 2 (two) times daily. To check blood sugars twice a day. 08/27/16   Renato Shin, MD  folic acid (FOLVITE) 1 MG tablet Take 1 mg by mouth daily. 09/03/17   [provider]  glucose blood (CONTOUR NEXT TEST) test strip Use to test blood sugar 2 times daily 12/18/16   Renato Shin, MD  glucose blood test strip Use as instructed 08/24/16   Renato Shin, MD  Methotrexate Sodium (METHOTREXATE, PF,) 50 MG/2ML injection  09/03/17   [provider]    Family History Family History  Problem Relation Age of Onset  . Cancer Mother        breast  . Cancer Father        lung  . Diabetes Father   . Cancer Sister        colon  . Cancer Maternal Aunt        breast - both  . Diabetes Sister   . Anesthesia problems Neg Hx     Social History Social History   Tobacco Use  . Smoking status: Former Smoker    Last attempt to quit: 02/18/1997    Years since quitting: 20.5  . Smokeless tobacco: Never Used  Substance Use Topics  . Alcohol use: Yes    Comment: rare  . Drug use: No     Allergies   Patient has no known allergies.   Review of Systems Review of Systems  Constitutional: Positive for fever. Negative for diaphoresis.  HENT: Negative for congestion and sore throat.   Eyes: Negative for photophobia and redness.    Respiratory: Negative for cough.   Cardiovascular: Negative for chest pain.  Gastrointestinal: Positive for nausea and vomiting. Negative for abdominal pain and diarrhea.  Endocrine: Negative for polyphagia.  Genitourinary: Negative for dyspareunia, dysuria, enuresis, flank pain, genital sores, menstrual problem and urgency.  Musculoskeletal: Negative for back pain, gait problem and neck stiffness.  Skin: Positive for color change.  Neurological: Positive for headaches. Negative for seizures, facial asymmetry, speech difficulty and numbness.  Psychiatric/Behavioral: Negative for behavioral problems, confusion, decreased concentration, dysphoric mood and hallucinations. The patient is not nervous/anxious and is not hyperactive.      Physical Exam Updated Vital Signs BP (!) 137/119   Pulse (!) 111   Temp (!) 100.9 F (38.3 C) (Oral)   Resp 19   Ht '5\' 7"'  (1.702 m)   Wt (!) 192.8 kg   SpO2 91%   BMI 66.56 kg/m   Physical Exam  Constitutional: She is oriented to person, place, and time. She appears well-developed and well-nourished.  Non-toxic appearance.  HENT:  Head: Normocephalic and atraumatic.  Mouth/Throat: No oropharyngeal exudate.  Eyes: Pupils are equal, round, and reactive to light. Conjunctivae are normal.  Neck: Trachea normal and normal range of motion. Neck supple. No Brudzinski's sign and no Kernig's sign noted. No thyroid mass present.  Cardiovascular: Regular rhythm, normal heart sounds and intact distal pulses. Tachycardia present.  Pulmonary/Chest: Effort normal and breath sounds normal. No stridor. No respiratory distress. She has no wheezes. She has no rales.  Abdominal: Soft. Bowel sounds are normal. She exhibits no mass. There is no tenderness. There is no rebound and no guarding.  Musculoskeletal: Normal range of motion. She exhibits edema.  Lymphadenopathy:    She has no cervical adenopathy.  Neurological: She is alert and oriented to person, place, and  time. She displays normal reflexes.  Skin: Skin is warm and dry. Capillary refill takes less than  2 seconds. She is not diaphoretic. There is erythema.     Psychiatric: She has a normal mood and affect.  Nursing note and vitals reviewed.    ED Treatments / Results  Labs (all labs ordered are listed, but only abnormal results are displayed) Results for orders placed or performed during the hospital encounter of 09/05/17  Comprehensive metabolic panel  Result Value Ref Range   Sodium 139 135 - 145 mmol/L   Potassium 4.9 3.5 - 5.1 mmol/L   Chloride 101 98 - 111 mmol/L   CO2 26 22 - 32 mmol/L   Glucose, Bld 192 (H) 70 - 99 mg/dL   BUN 12 6 - 20 mg/dL   Creatinine, Ser 0.94 0.44 - 1.00 mg/dL   Calcium 9.4 8.9 - 10.3 mg/dL   Total Protein 7.2 6.5 - 8.1 g/dL   Albumin 3.8 3.5 - 5.0 g/dL   AST 35 15 - 41 U/L   ALT 21 0 - 44 U/L   Alkaline Phosphatase 85 38 - 126 U/L   Total Bilirubin 1.1 0.3 - 1.2 mg/dL   GFR calc non Af Amer >60 >60 mL/min   GFR calc Af Amer >60 >60 mL/min   Anion gap 12 5 - 15  CBC WITH DIFFERENTIAL  Result Value Ref Range   WBC 10.1 4.0 - 10.5 K/uL   RBC 5.28 (H) 3.87 - 5.11 MIL/uL   Hemoglobin 13.4 12.0 - 15.0 g/dL   HCT 44.1 36.0 - 46.0 %   MCV 83.5 78.0 - 100.0 fL   MCH 25.4 (L) 26.0 - 34.0 pg   MCHC 30.4 30.0 - 36.0 g/dL   RDW 16.5 (H) 11.5 - 15.5 %   Platelets  150 - 400 K/uL    PLATELET CLUMPS NOTED ON SMEAR, COUNT APPEARS ADEQUATE   Neutrophils Relative % 87 %   Neutro Abs 8.7 (H) 1.7 - 7.7 K/uL   Lymphocytes Relative 6 %   Lymphs Abs 0.6 (L) 0.7 - 4.0 K/uL   Monocytes Relative 6 %   Monocytes Absolute 0.6 0.1 - 1.0 K/uL   Eosinophils Relative 1 %   Eosinophils Absolute 0.1 0.0 - 0.7 K/uL   Basophils Relative 0 %   Basophils Absolute 0.0 0.0 - 0.1 K/uL   Immature Granulocytes 0 %   Abs Immature Granulocytes 0.0 0.0 - 0.1 K/uL  Urinalysis, Routine w reflex microscopic  Result Value Ref Range   Color, Urine YELLOW YELLOW   APPearance CLEAR  CLEAR   Specific Gravity, Urine 1.021 1.005 - 1.030   pH 7.0 5.0 - 8.0   Glucose, UA NEGATIVE NEGATIVE mg/dL   Hgb urine dipstick NEGATIVE NEGATIVE   Bilirubin Urine NEGATIVE NEGATIVE   Ketones, ur NEGATIVE NEGATIVE mg/dL   Protein, ur NEGATIVE NEGATIVE mg/dL   Nitrite NEGATIVE NEGATIVE   Leukocytes, UA NEGATIVE NEGATIVE  I-Stat CG4 Lactic Acid, ED  (not at  [facility])  Result Value Ref Range   Lactic Acid, Venous 2.03 (HH) 0.5 - 1.9 mmol/L   Comment NOTIFIED PHYSICIAN    Dg Chest Port 1 View  Result Date: 09/05/2017 CLINICAL DATA:  Sudden fever spike. Pain in the neck and back. Recent treatment for cellulitis. Shortness of breath. EXAM: PORTABLE CHEST 1 VIEW COMPARISON:  09/10/2016 FINDINGS: Shallow inspiration. Linear atelectasis in the lung bases. Cardiac enlargement. Pulmonary vascularity is normal. No airspace disease or consolidation in the lungs. No blunting of costophrenic angles. No pneumothorax. Mediastinal contours appear intact. IMPRESSION: Shallow inspiration with linear atelectasis in the lung  bases. Cardiac enlargement. No focal consolidation. Electronically Signed   By: Lucienne Capers M.D.   On: 09/05/2017 06:17    EKG EKG Interpretation  Date/Time:  Thursday September 05 2017 05:00:23 EDT Ventricular Rate:  118 PR Interval:    QRS Duration: 84 QT Interval:  310 QTC Calculation: 435 R Axis:   6 Text Interpretation:  Sinus tachycardia Low voltage, precordial leads Confirmed by Dory Horn) on 09/05/2017 5:38:28 AM   Radiology Dg Chest Port 1 View  Result Date: 09/05/2017 CLINICAL DATA:  Sudden fever spike. Pain in the neck and back. Recent treatment for cellulitis. Shortness of breath. EXAM: PORTABLE CHEST 1 VIEW COMPARISON:  09/10/2016 FINDINGS: Shallow inspiration. Linear atelectasis in the lung bases. Cardiac enlargement. Pulmonary vascularity is normal. No airspace disease or consolidation in the lungs. No blunting of costophrenic angles. No pneumothorax.  Mediastinal contours appear intact. IMPRESSION: Shallow inspiration with linear atelectasis in the lung bases. Cardiac enlargement. No focal consolidation. Electronically Signed   By: Lucienne Capers M.D.   On: 09/05/2017 06:17    Procedures Procedures (including critical care time)  Medications Ordered in ED Medications  metroNIDAZOLE (FLAGYL) IVPB 500 mg (500 mg Intravenous New Bag/Given 09/05/17 0540)  vancomycin (VANCOCIN) 2,000 mg in sodium chloride 0.9 % 500 mL IVPB (2,000 mg Intravenous New Bag/Given 09/05/17 0515)  ceFEPIme (MAXIPIME) 2 g in sodium chloride 0.9 % 100 mL IVPB (0 g Intravenous Stopped 09/05/17 0601)  sodium chloride 0.9 % bolus 1,000 mL (1,000 mLs Intravenous New Bag/Given 09/05/17 0519)  ketorolac (TORADOL) 30 MG/ML injection 30 mg (30 mg Intravenous Given 09/05/17 0526)  ondansetron (ZOFRAN) injection 4 mg (4 mg Intravenous Given 09/05/17 0551)   MDM Reviewed: previous chart and nursing note Reviewed previous: labs Interpretation: labs Total time providing critical care: 30-74 minutes. This excludes time spent performing separately reportable procedures and services. Consults: admitting MD   CRITICAL CARE Performed by: Carlisle Beers Total critical care time: 31 minutes Critical care time was exclusive of separately billable procedures and treating other patients. Critical care was necessary to treat or prevent imminent or life-threatening deterioration. Critical care was time spent personally by me on the following activities: development of treatment plan with patient and/or surrogate as well as nursing, discussions with consultants, evaluation of patient's response to treatment, examination of patient, obtaining history from patient or surrogate, ordering and performing treatments and interventions, ordering and review of laboratory studies, ordering and review of radiographic studies, pulse oximetry and re-evaluation of patient's condition.    Final  Clinical Impressions(s) / ED Diagnoses   Will admit for cellulitis with several criteria for sepsis.  Given previous EF on echo I have not given a 30 cc/KG bolus.  HR is improved and patient is resting more com   Randal Buba, Aarit Kashuba, MD 09/05/17 [place], Jerrye Seebeck, MD 10/28/17 [id number]

## 2017-09-05 NOTE — Progress Notes (Addendum)
Inpatient Diabetes Program Recommendations  AACE/ADA: New Consensus Statement on Inpatient Glycemic Control (2015)  Target Ranges:  Prepandial:   less than 140 mg/dL      Peak postprandial:   less than 180 mg/dL (1-2 hours)      Critically ill patients:  140 - 180 mg/dL   Lab Results  Component Value Date   GLUCAP 147 (H) 09/05/2017   HGBA1C 9.4 (H) 09/05/2017    Review of Glycemic Control  Diabetes history: DM2 Outpatient Diabetes medications: Lantus 250 units daily Current orders for Inpatient glycemic control: Lantus 80 units bid + Novolog correction scale 0-20 units tid  Inpatient Diabetes Program Recommendations:   Noted patient is followed by Dr. Loanne Drilling as her endocrinologist and was last seen on 08/14/17.Patient is currently on Lantus 250 units daily due to was having hardship on multiple injections. Attempted to speak to pt by phone but patient is currently having pain and daughter answered her phone. Will discuss if patient has discussed option of U500 in the past.  Received consult regarding insulin dosing. 50% of home dose = 125 units daily = 60 units bid and reevaluate in am. Current Lantus dose covers basal + meal coverage.  Thank you, Nani Gasser. Jamelia Varano, RN, MSN, CDE  Diabetes Coordinator Inpatient Glycemic Control Team Team Pager [phone] (8am-5pm) 09/05/2017 4:55 PM

## 2017-09-05 NOTE — Progress Notes (Signed)
Pharmacy Antibiotic Note  Cheyenne Guzman is a 52 y.o. female admitted on 09/05/2017 with sepsis.  Pharmacy has been consulted for Vancomycin/Cefepime dosing. From home with fever. WBC WNL. Renal function Ok.   Plan: Vancomycin 2000 mg IV x 1, then give 1750 mg IV q12h Cefepime 1g IV q8h Trend WBC, temp, renal function  F/U infectious work-up Drug levels as indicated   Height: 5\' 7"  (170.2 cm) Weight: (!) 425 lb (192.8 kg) IBW/kg (Calculated) : 61.6  Temp (24hrs), Avg:100.9 F (38.3 C), Min:100.9 F (38.3 C), Max:100.9 F (38.3 C)  Recent Labs  Lab 09/05/17 0500 09/05/17 0525  WBC 10.1  --   CREATININE 0.94  --   LATICACIDVEN  --  2.03*    Estimated Creatinine Clearance: 126.1 mL/min (by C-G formula based on SCr of 0.94 mg/dL).    No Known Allergies  Narda Bonds 09/05/2017 6:33 AM

## 2017-09-05 NOTE — ED Notes (Signed)
Radiology made aware that pt has a bed and report has been called; radiology states that they will go ahead and transport pt for LP

## 2017-09-05 NOTE — ED Triage Notes (Signed)
Pt. From home via EMS with reports of sudden spike of fever as high as 101.6 and pain to neck and back. Pt. Had recent treatment for cellulitis. Pt. Took 2 tylenol but did vomit afterwards. Pt. States she feels like it may be a possible UTI.  CBG 197

## 2017-09-05 NOTE — Progress Notes (Signed)
Pt c/o IV to right forearm burning.  IV appeared to be only inserted half way, Pump paused and RN notified.

## 2017-09-05 NOTE — ED Notes (Signed)
Attempted report 

## 2017-09-05 NOTE — ED Notes (Signed)
Patient transported to CT 

## 2017-09-05 NOTE — ED Notes (Addendum)
Pt tolerating ice pack on arm, denies any pain to R forearm at this time; no redness or swelling noted

## 2017-09-05 NOTE — ED Notes (Signed)
Dr. Yates at bedside.  

## 2017-09-06 ENCOUNTER — Other Ambulatory Visit: Payer: Self-pay

## 2017-09-06 DIAGNOSIS — F411 Generalized anxiety disorder: Secondary | ICD-10-CM | POA: Diagnosis not present

## 2017-09-06 DIAGNOSIS — B349 Viral infection, unspecified: Secondary | ICD-10-CM

## 2017-09-06 LAB — CBC
HEMATOCRIT: 35.8 % — AB (ref 36.0–46.0)
HEMATOCRIT: 41.2 % (ref 36.0–46.0)
HEMOGLOBIN: 10.9 g/dL — AB (ref 12.0–15.0)
Hemoglobin: 11.9 g/dL — ABNORMAL LOW (ref 12.0–15.0)
MCH: 25.7 pg — ABNORMAL LOW (ref 26.0–34.0)
MCH: 25.4 pg — ABNORMAL LOW (ref 26.0–34.0)
MCHC: 30.4 g/dL (ref 30.0–36.0)
MCHC: 28.9 g/dL — AB (ref 30.0–36.0)
MCV: 84.4 fL (ref 78.0–100.0)
MCV: 88 fL (ref 78.0–100.0)
Platelets: 128 10*3/uL — ABNORMAL LOW (ref 150–400)
Platelets: 127 10*3/uL — ABNORMAL LOW (ref 150–400)
RBC: 4.24 MIL/uL (ref 3.87–5.11)
RBC: 4.68 MIL/uL (ref 3.87–5.11)
RDW: 16.8 % — ABNORMAL HIGH (ref 11.5–15.5)
RDW: 17.1 % — AB (ref 11.5–15.5)
WBC: 7.1 10*3/uL (ref 4.0–10.5)
WBC: 5.2 10*3/uL (ref 4.0–10.5)

## 2017-09-06 LAB — GLUCOSE, CAPILLARY
GLUCOSE-CAPILLARY: 227 mg/dL — AB (ref 70–99)
Glucose-Capillary: 238 mg/dL — ABNORMAL HIGH (ref 70–99)
Glucose-Capillary: 185 mg/dL — ABNORMAL HIGH (ref 70–99)
Glucose-Capillary: 198 mg/dL — ABNORMAL HIGH (ref 70–99)

## 2017-09-06 LAB — VDRL, CSF: SYPHILIS VDRL QUANT CSF: NONREACTIVE

## 2017-09-06 LAB — BASIC METABOLIC PANEL
ANION GAP: 6 (ref 5–15)
BUN: 21 mg/dL — ABNORMAL HIGH (ref 6–20)
CO2: 28 mmol/L (ref 22–32)
Calcium: 8 mg/dL — ABNORMAL LOW (ref 8.9–10.3)
Chloride: 102 mmol/L (ref 98–111)
Creatinine, Ser: 1.21 mg/dL — ABNORMAL HIGH (ref 0.44–1.00)
GFR calc Af Amer: 59 mL/min — ABNORMAL LOW (ref >60)
GFR, EST NON AFRICAN AMERICAN: 51 mL/min — AB (ref >60)
GLUCOSE: 238 mg/dL — AB (ref 70–99)
POTASSIUM: 4.3 mmol/L (ref 3.5–5.1)
SODIUM: 136 mmol/L (ref 135–145)

## 2017-09-06 LAB — HERPES SIMPLEX VIRUS(HSV) DNA BY PCR
HSV 1 DNA: NEGATIVE
HSV 2 DNA: NEGATIVE

## 2017-09-06 LAB — URINE CULTURE: CULTURE: NO GROWTH

## 2017-09-06 LAB — HIV ANTIBODY (ROUTINE TESTING W REFLEX): HIV SCREEN 4TH GENERATION: NONREACTIVE

## 2017-09-06 LAB — CREATININE, SERUM
Creatinine, Ser: 1.09 mg/dL — ABNORMAL HIGH (ref 0.44–1.00)
GFR calc non Af Amer: 57 mL/min — ABNORMAL LOW (ref >60)

## 2017-09-06 MED ORDER — OXYCODONE HCL 5 MG PO TABS
5.0000 mg | ORAL_TABLET | ORAL | Status: DC | PRN
  Administered 2017-09-06: 5 mg via ORAL
  Filled 2017-09-06: qty 1

## 2017-09-06 MED ORDER — CEFDINIR 300 MG PO CAPS
300.0000 mg | ORAL_CAPSULE | Freq: Two times a day (BID) | ORAL | Status: DC
  Administered 2017-09-06 – 2017-09-07 (×2): 300 mg via ORAL
  Filled 2017-09-06 (×3): qty 1

## 2017-09-06 MED ORDER — ENOXAPARIN SODIUM 40 MG/0.4ML ~~LOC~~ SOLN
40.0000 mg | SUBCUTANEOUS | Status: DC
  Administered 2017-09-06: 40 mg via SUBCUTANEOUS
  Filled 2017-09-06: qty 0.4

## 2017-09-06 MED ORDER — SODIUM CHLORIDE 0.9 % IV SOLN: 2.0000 g | INTRAVENOUS | Status: DC

## 2017-09-06 MED ORDER — DEXTROSE 5 % IV SOLN
1000.0000 mg | Freq: Three times a day (TID) | INTRAVENOUS | Status: DC
  Administered 2017-09-06: 1000 mg via INTRAVENOUS
  Filled 2017-09-06 (×3): qty 20

## 2017-09-06 MED ORDER — LACTATED RINGERS IV SOLN
INTRAVENOUS | Status: DC
  Administered 2017-09-06 – 2017-09-07 (×2): via INTRAVENOUS

## 2017-09-06 MED ORDER — VANCOMYCIN HCL 10 G IV SOLR
1250.0000 mg | Freq: Two times a day (BID) | INTRAVENOUS | Status: DC
  Filled 2017-09-06 (×2): qty 1250

## 2017-09-06 NOTE — Progress Notes (Addendum)
Inpatient Diabetes Program Recommendations  AACE/ADA: New Consensus Statement on Inpatient Glycemic Control (2019)  Target Ranges:  Prepandial:   less than 140 mg/dL      Peak postprandial:   less than 180 mg/dL (1-2 hours)      Critically ill patients:  140 - 180 mg/dL   Results for GABRELLA, STROH (MRN [id number]) as of 09/06/2017 09:08  Ref. Range 09/05/2017 10:19 09/05/2017 13:52 09/05/2017 16:21 09/06/2017 06:50  Glucose-Capillary Latest Ref Range: 70 - 99 mg/dL 152 (H) 112 (H) 147 (H) 238 (H)  Results for CINDE, EBERT (MRN [id number]) as of 09/06/2017 09:08  Ref. Range 08/14/2017 10:54 09/05/2017 14:21  Hemoglobin A1C Latest Ref Range: 4.8 - 5.6 % 10.3 (A) 9.4 (H)   Review of Glycemic Control  Diabetes history: DM2 Outpatient Diabetes medications: Lantus 150-250 units QAM (depending on glucose), Humalog 0-50 units Current orders for Inpatient glycemic control: Lantus 80 units BID, Novolog 0-20 units TID with meals  Inpatient Diabetes Program Recommendations: Insulin - Basal: Please consider increasing bedtime dose of Lantus to 85 units QHS (continued Lantus 80 units QAM). Insulin-Meal Coverage: Please consider ordering Novolog 6 units TID with meals for meal coverage if patient eats at least 50% of meals. Correction (SSI): Please consider ordering Novolog 0-5 units QHS for bedtime correction. HgbA1C: A1C 9.4% on 09/05/17 indicating an average glucose of 223 mg/dl over the past 2-3 months.  Addendum 09/06/17@11 :21-Spoke with patient over the phone about diabetes and home regimen for diabetes control. Patient reports that she is followed by Dr. Loanne Drilling for diabetes management and currently she takes Lantus 150-250 units QAM and Humalog 0-50 units with meals (dose depends on glucose and meal) as an outpatient for diabetes control. Patient last seen Dr. Loanne Drilling on 08/14/17. Patient states that she had an issue with Lantus prescription and she was not able to take prescribed dose of Lantus in May  and June because she was not given enough Lantus to last for prior 2 months. However, patient states that the issue with the Lantus has been resolved and she is now able to get what she needs. Patient states that she checks her glucose 3-4 times per day and that it is usually ranges from 170-low 200's mg/dl.  Discussed A1C results (9.4% on 09/05/17) and explained that her current A1C indicates an average glucose of 223 mg/dl over the past 2-3 months.  Discussed glucose and A1C goals. Discussed importance of checking CBGs and maintaining good CBG control to prevent long-term and short-term complications. Stressed to the patient the importance of improving glycemic control to prevent further complications from uncontrolled diabetes. Discussed impact of nutrition, exercise, stress, sickness, and medications on diabetes control. Patient states that she tries watch what she eats and she mostly drinks water and Lipton peach tea but does drink a Dr Malachi Bonds from time to time.  Discussed carbohydrates, carbohydrate goals per day and meal, along with portion sizes. Inquired about whether Dr. Loanne Drilling has ever mentioned using Humulin R U500 and patient reports that he has not. Discussed Humulin R U500 and explained that it may be an option for her since she is using large dosages of Lantus and Humalog. Encouraged patient to discuss with Dr. Loanne Drilling if interested. Discussed FreeStyle Libre (flash glucose monitoring sensor) and explained how it works and that it could provide more information for her and for Dr. Loanne Drilling to improve DM control. Patient is interested in using the [employer] and asked if prescription for the FreeStyle [place] could be  provided at time of discharge. Informed patient that a request for attending doctor to provide Rx at time of discharge would be requested.  Encouraged patient to continue checking her glucose 3-4 times per day (before meals and at bedtime) and to continue to follow up with Dr.  Loanne Drilling.  Patient verbalized understanding of information discussed and she states that she has no further questions at this time related to diabetes.   Thanks, Barnie Alderman, RN, MSN, CDE Diabetes Coordinator Inpatient Diabetes Program [phone] (Team Pager from 8am to 5pm)

## 2017-09-06 NOTE — Progress Notes (Signed)
Pharmacy Antibiotic Note  Cheyenne Guzman is a 52 y.o. female admitted on 09/05/2017 with sepsis.  Pharmacy has been consulted for Vancomycin dosing along with Rocephin + Acyclovir per MD due to concern for meningitis.   Renal function has worsened today with SCr up to 1.21, normalized CrCl~60-65 ml/min. Will reduce the Vancomycin dose.   Discussed LP results with MD and got okay to adjust Rocephin dosing to q24h since no need to target meningitis treatment. Will reduce Acyclovir dose slightly to match our vial sizes and attempt to reduce nephrotoxicity.  Plan: - Adjust Vancomycin to 1250 mg IV every 12 hours - Adjust Acyclovir to 1000 mg IV every 8 hours - Adjust Rocephin to 2g IV every 24 hours - Will continue to follow renal function, culture results, LOT, and antibiotic de-escalation plans   Height: 5\' 7"  (170.2 cm) Weight: (!) 444 lb 10.7 oz (201.7 kg) IBW/kg (Calculated) : 61.6  Temp (24hrs), Avg:99.9 F (37.7 C), Min:98 F (36.7 C), Max:103.2 F (39.6 C)  Recent Labs  Lab 09/05/17 0500 09/05/17 0525 09/05/17 0747 09/06/17 0528  WBC 10.1  --   --  7.1  CREATININE 0.94  --   --  1.21*  LATICACIDVEN  --  2.03* 1.09  --     Estimated Creatinine Clearance: 101 mL/min (A) (by C-G formula based on SCr of 1.21 mg/dL (H)).    Allergies  Allergen Reactions  . Vicodin [Hydrocodone-Acetaminophen] Nausea And Vomiting   Vanc 8/15 >> CTX 8/15 >> Acyclvovir 8/15 >>  8/15 RVP >> neg 8/15 UCx >> NG 8/15 CSF cx >> 8/15 BCx >> ngtd  Thank you for allowing pharmacy to be a part of this patient's care.  Alycia Rossetti, PharmD, BCPS Clinical Pharmacist Pager: [phone] Clinical phone for 09/06/2017 from 7a-3:30p: [phone] If after 3:30p, please call main pharmacy at: [phone] Please check AMION for all [place] numbers 09/06/2017 11:39 AM

## 2017-09-06 NOTE — Progress Notes (Signed)
Patient developed fever 103.2 at 2100 gave 2200 medications and tylenol and at 0000 temp was 99.8, patients daughter came back to hospital and woke Mother up, patient has CPAP and is resting comfortable, will continue to monitor.

## 2017-09-06 NOTE — Progress Notes (Signed)
Patient refused ambulation to chair at this time.

## 2017-09-06 NOTE — Progress Notes (Signed)
PROGRESS NOTE    Cheyenne Guzman  FAO:[id number] DOB: 11-19-1965 DOA: 09/05/2017 PCP: Laurey Morale, MD    Brief Narrative:  52 y.o. female with medical history significant for morbid obesity, remote history of breast cancer,  Arthritis, diabetes type 2 insulin-dependent,  pyelonephritis, urosepsis, and a recent history of right lower extremity cellulitis treated with Keflex, presenting with "horrible headache "accompanied by nausea, and vomiting, as well as neck pain.  The patient states that she does have a history of chronic headaches, but this is very unusual for her.  She also "felt confused "prior to being admitted.  She had intermittent fevers, going from "97" to 101.5 prior to presentation.  She is not aware of any sick contacts, however, the daughter reports that she has been at the pool, and had "algae" and did not look clean.  She denies any vision changes, or photophobia.  No dysphagia.  She denies any chest pain or palpitations.  She denies any shortness of breath or cough.  The patient denies any abdominal pain, or diarrhea.  She denies any dysuria or gross hematuria.  As for her legs, she denies any worsening leg swelling, and the redness in her right lower extremity is improving according to her.  She denies any history of PE or DVT.  Denies any changes in medications  Assessment & Plan:   Principal Problem:   Acute viral syndrome Active Problems:   Anxiety state   Asthma   Morbid obesity ([place])   Obstructive sleep apnea   Migraine   Hypothyroidism   Cellulitis of right leg  Presumed meningitis patient presents with viral symptoms, including intermittent fever, spiking to 101.5, currently 100.9, nausea, vomiting, diaphoresis, and neck pain.  No mental status changes.  Presenting white count is normal at 10.1.  Normal lactic acid. Pt s/p LP, no evidence of bacterial meningitis HSV neg Will d/c rocephin, vanc, and acyclovir Will continue on empiric omnicef for now as patient  reports prior hx of of UTI UA currently neg Blood cx thus far neg x2 Resp viral panel neg  Anxiety and depression Continue Xanax 3 times daily as needed and Zoloft Stable at present  Type II Diabetes  Continue Neurontin as tolerated  History of asthma, no wheezing on exam OSA  Continue home Proventil at every 4 hours as needed for shortness of breath and wheezing Continue CPAP On minimal O2 support  Hypothyroidism: Continue home Synthroid as tolerated  History of chronic pain syndrome, continue home medications with oxycodone as needed, muscle relaxers, Neurontin Continue view brain [place] pain weekly, due for September 08, 2017 if the patient remains in the hospital. She is on MTX weekly, not ordered at this time  Seems stable Consulted PT   ARF not present on admission -Cr this AM of 1.21, 28.7% rise in Cr from presenting Cr -Will start patient on IVF hydration -Repeat bmet in AM  DVT prophylaxis: Lovenox subQ Code Status: Full Family Communication: Pt in room, family at bedside Disposition Plan: Possible home in 24-48hrs  Consultants:     Procedures:     Antimicrobials: Anti-infectives (From admission, onward)   Start     Dose/Rate Route Frequency Ordered Stop   09/07/17 1000  cefTRIAXone (ROCEPHIN) 2 g in sodium chloride 0.9 % 100 mL IVPB     2 g 200 mL/hr over 30 Minutes Intravenous Every 24 hours 09/06/17 1131     09/06/17 1800  vancomycin (VANCOCIN) 1,250 mg in sodium chloride 0.9 % 250 mL  IVPB     1,250 mg 166.7 mL/hr over 90 Minutes Intravenous Every 12 hours 09/06/17 1135     09/06/17 1400  acyclovir (ZOVIRAX) 1,000 mg in dextrose 5 % 150 mL IVPB     1,000 mg 170 mL/hr over 60 Minutes Intravenous Every 8 hours 09/06/17 1134     09/05/17 1800  vancomycin (VANCOCIN) 1,750 mg in sodium chloride 0.9 % 500 mL IVPB  Status:  Discontinued     1,750 mg 250 mL/hr over 120 Minutes Intravenous Every 12 hours 09/05/17 0638 09/06/17 1135   09/05/17 1600   acyclovir (ZOVIRAX) 1,200 mg in dextrose 5 % 250 mL IVPB  Status:  Discontinued     1,200 mg 274 mL/hr over 60 Minutes Intravenous Every 8 hours 09/05/17 1538 09/06/17 1134   09/05/17 1400  ceFEPIme (MAXIPIME) 1 g in sodium chloride 0.9 % 100 mL IVPB  Status:  Discontinued     1 g 200 mL/hr over 30 Minutes Intravenous Every 8 hours 09/05/17 0638 09/05/17 0834   09/05/17 0900  cefTRIAXone (ROCEPHIN) 2 g in sodium chloride 0.9 % 100 mL IVPB  Status:  Discontinued     2 g 200 mL/hr over 30 Minutes Intravenous 2 times daily 09/05/17 0834 09/06/17 1131   09/05/17 0515  ceFEPIme (MAXIPIME) 2 g in sodium chloride 0.9 % 100 mL IVPB     2 g 200 mL/hr over 30 Minutes Intravenous  Once 09/05/17 0500 09/05/17 0601   09/05/17 0515  metroNIDAZOLE (FLAGYL) IVPB 500 mg  Status:  Discontinued     500 mg 100 mL/hr over 60 Minutes Intravenous Every 8 hours 09/05/17 0500 09/05/17 0956   09/05/17 0515  vancomycin (VANCOCIN) IVPB 1000 mg/200 mL premix  Status:  Discontinued     1,000 mg 200 mL/hr over 60 Minutes Intravenous  Once 09/05/17 0500 09/05/17 0508   09/05/17 0515  vancomycin (VANCOCIN) 2,000 mg in sodium chloride 0.9 % 500 mL IVPB     2,000 mg 250 mL/hr over 120 Minutes Intravenous  Once 09/05/17 0508 09/05/17 0804       Subjective: Feels better today  Objective: Vitals:   09/06/17 0747 09/06/17 1134 09/06/17 1616 09/06/17 1747  BP: 132/67 135/69 (!) 154/76   Pulse: 79 78 76   Resp: 18 16 17    Temp: 98.1 F (36.7 C) 98.7 F (37.1 C) 98.2 F (36.8 C) 98.7 F (37.1 C)  TempSrc: Oral Oral Oral Oral  SpO2: 98% 93% 96%   Weight:      Height:        Intake/Output Summary (Last 24 hours) at 09/06/2017 1803 Last data filed at 09/06/2017 1723 Gross per 24 hour  Intake 1347.98 ml  Output 1000 ml  Net 347.98 ml   Filed Weights   09/05/17 0459 09/06/17 0600  Weight: (!) 192.8 kg (!) 201.7 kg    Examination:  General exam: Appears calm and comfortable  Respiratory system: Clear to  auscultation. Respiratory effort normal. Cardiovascular system: S1 & S2 heard, RRR Gastrointestinal system: Abdomen is nondistended, soft and nontender. No organomegaly or masses felt. Normal bowel sounds heard. Central nervous system: Alert and oriented. No focal neurological deficits. Extremities: Symmetric 5 x 5 power. Skin: No rashes, lesions Psychiatry: Judgement and insight appear normal. Mood & affect appropriate.   Data Reviewed: I have personally reviewed following labs and imaging studies  CBC: Recent Labs  Lab 09/05/17 0500 09/06/17 0528  WBC 10.1 7.1  NEUTROABS 8.7*  --   HGB 13.4 10.9*  HCT 44.1 35.8*  MCV 83.5 84.4  PLT PLATELET CLUMPS NOTED ON SMEAR, COUNT APPEARS ADEQUATE 295*   Basic Metabolic Panel: Recent Labs  Lab 09/05/17 0500 09/06/17 0528  NA 139 136  K 4.9 4.3  CL 101 102  CO2 26 28  GLUCOSE 192* 238*  BUN 12 21*  CREATININE 0.94 1.21*  CALCIUM 9.4 8.0*   GFR: Estimated Creatinine Clearance: 101 mL/min (A) (by C-G formula based on SCr of 1.21 mg/dL (H)). Liver Function Tests: Recent Labs  Lab 09/05/17 0500  AST 35  ALT 21  ALKPHOS 85  BILITOT 1.1  PROT 7.2  ALBUMIN 3.8   No results for input(s): LIPASE, AMYLASE in the last 168 hours. No results for input(s): AMMONIA in the last 168 hours. Coagulation Profile: No results for input(s): INR, PROTIME in the last 168 hours. Cardiac Enzymes: No results for input(s): CKTOTAL, CKMB, CKMBINDEX, TROPONINI in the last 168 hours. BNP (last 3 results) No results for input(s): PROBNP in the last 8760 hours. HbA1C: Recent Labs    09/05/17 1421  HGBA1C 9.4*   CBG: Recent Labs  Lab 09/05/17 1352 09/05/17 1621 09/06/17 0650 09/06/17 1133 09/06/17 1638  GLUCAP 112* 147* 238* 227* 185*   Lipid Profile: No results for input(s): CHOL, HDL, LDLCALC, TRIG, CHOLHDL, LDLDIRECT in the last 72 hours. Thyroid Function Tests: No results for input(s): TSH, T4TOTAL, FREET4, T3FREE, THYROIDAB in the  last 72 hours. Anemia Panel: No results for input(s): VITAMINB12, FOLATE, FERRITIN, TIBC, IRON, RETICCTPCT in the last 72 hours. Sepsis Labs: Recent Labs  Lab 09/05/17 0525 09/05/17 0747  LATICACIDVEN 2.03* 1.09    Recent Results (from the past 240 hour(s))  Blood Culture (routine x 2)     Status: None (Preliminary result)   Collection Time: 09/05/17  5:00 AM  Result Value Ref Range Status   Specimen Description BLOOD LEFT HAND  Final   Special Requests   Final    BOTTLES DRAWN AEROBIC AND ANAEROBIC Blood Culture results may not be optimal due to an inadequate volume of blood received in culture bottles   Culture   Final    NO GROWTH 1 DAY Performed at [facility], [place] [address] [place], [place] [address]    Report Status PENDING  Incomplete  Blood Culture (routine x 2)     Status: None (Preliminary result)   Collection Time: 09/05/17  5:17 AM  Result Value Ref Range Status   Specimen Description BLOOD RIGHT HAND  Final   Special Requests   Final    BOTTLES DRAWN AEROBIC AND ANAEROBIC Blood Culture results may not be optimal due to an excessive volume of blood received in culture bottles   Culture   Final    NO GROWTH 1 DAY Performed at [facility], [place] [address], [place], [place] [address]    Report Status PENDING  Incomplete  CSF culture     Status: None (Preliminary result)   Collection Time: 09/05/17 12:55 PM  Result Value Ref Range Status   Specimen Description CSF  Final   Special Requests NONE  Final   Gram Stain NO WBC SEEN NO ORGANISMS SEEN   Final   Culture   Final    NO GROWTH < 24 HOURS Performed at [facility], [place] [address] [place], [place] [address]    Report Status PENDING  Incomplete  Respiratory Panel by PCR     Status: None   Collection Time: 09/05/17  4:38 PM  Result Value Ref Range Status   Adenovirus NOT DETECTED NOT DETECTED Final   Coronavirus 229E NOT DETECTED NOT DETECTED Final   Coronavirus HKU1 NOT DETECTED NOT  DETECTED Final   Coronavirus NL63 NOT DETECTED NOT DETECTED Final   Coronavirus OC43 NOT DETECTED NOT DETECTED Final   Metapneumovirus NOT DETECTED NOT DETECTED Final   Rhinovirus / Enterovirus NOT DETECTED NOT DETECTED Final   Influenza A NOT DETECTED NOT DETECTED Final   Influenza B NOT DETECTED NOT DETECTED Final   Parainfluenza Virus 1 NOT DETECTED NOT DETECTED Final   Parainfluenza Virus 2 NOT DETECTED NOT DETECTED Final   Parainfluenza Virus 3 NOT DETECTED NOT DETECTED Final   Parainfluenza Virus 4 NOT DETECTED NOT DETECTED Final   Respiratory Syncytial Virus NOT DETECTED NOT DETECTED Final   Bordetella pertussis NOT DETECTED NOT DETECTED Final   Chlamydophila pneumoniae NOT DETECTED NOT DETECTED Final   Mycoplasma pneumoniae NOT DETECTED NOT DETECTED Final    Comment: Performed at [facility], [place] [address] [place], [place] [address]  Culture, Urine     Status: None   Collection Time: 09/05/17  4:48 PM  Result Value Ref Range Status   Specimen Description URINE, RANDOM  Final   Special Requests NONE  Final   Culture   Final    NO GROWTH Performed at [facility], [place] [address] [place],  [address]    Report Status 09/06/2017 FINAL  Final     Radiology Studies: Ct Head Wo Contrast  Result Date: 09/05/2017 CLINICAL DATA:  52 year old female with fever, headache, neck and back pain. Recently treated for cellulitis. EXAM: CT HEAD WITHOUT CONTRAST TECHNIQUE: Contiguous axial images were obtained from the base of the skull through the vertex without intravenous contrast. COMPARISON:  Brain MRI 07/20/2013.  Head CT 10/02/2009. FINDINGS: Brain: Stable cerebral volume since 2015, within normal limits. No midline shift, ventriculomegaly, mass effect, evidence of mass lesion, intracranial hemorrhage or evidence of cortically based acute infarction. Gray-white matter differentiation is within normal limits throughout the brain. Mild chronic dystrophic calcifications  at the globus pallidus. Vascular: Mild Calcified atherosclerosis at the skull base. No suspicious intracranial vascular hyperdensity. Skull: Negative. Sinuses/Orbits: Paranasal sinuses and mastoids are stable and clear. Other: Visualized orbits and scalp soft tissues are within normal limits. Negative visible noncontrast deep soft tissue spaces of the face. IMPRESSION: Normal noncontrast Head CT. Electronically Signed   By: Genevie Ann M.D.   On: 09/05/2017 09:55   Dg Chest Port 1 View  Result Date: 09/05/2017 CLINICAL DATA:  Sudden fever spike. Pain in the neck and back. Recent treatment for cellulitis. Shortness of breath. EXAM: PORTABLE CHEST 1 VIEW COMPARISON:  09/10/2016 FINDINGS: Shallow inspiration. Linear atelectasis in the lung bases. Cardiac enlargement. Pulmonary vascularity is normal. No airspace disease or consolidation in the lungs. No blunting of costophrenic angles. No pneumothorax. Mediastinal contours appear intact. IMPRESSION: Shallow inspiration with linear atelectasis in the lung bases. Cardiac enlargement. No focal consolidation. Electronically Signed   By: Lucienne Capers M.D.   On: 09/05/2017 06:17   Dg Fluoro Guide Lumbar Puncture  Result Date: 09/05/2017 CLINICAL DATA:  Headache and fever rule out meningitis EXAM: DIAGNOSTIC LUMBAR PUNCTURE UNDER FLUOROSCOPIC GUIDANCE FLUOROSCOPY TIME:  Fluoroscopy Time:  0 minutes 48 seconds Radiation Exposure Index (if provided by the fluoroscopic device): Number of Acquired Spot Images: 0 PROCEDURE: Informed consent was obtained from the patient prior to the procedure, including potential complications of headache, allergy, and pain. With the  patient prone, the lower back was prepped with Betadine. 1% Lidocaine was used for local anesthesia. Lumbar puncture was performed at the L4-5 level using a 20 gauge needle with return of clear CSF with an opening pressure of 15 cm water. Twelve ml of CSF were obtained for laboratory studies. The patient  tolerated the procedure well and there were no apparent complications. Lumbar puncture performed at L4-5 due to prior laminectomy and fusion at this level. Lumbar puncture was quite difficult due to bony overgrowth IMPRESSION: Difficult but successful lumbar puncture. Clear CSF sent to laboratory for evaluation. Electronically Signed   By: Franchot Gallo M.D.   On: 09/05/2017 13:26    Scheduled Meds: . aspirin  325 mg Oral QHS  . folic acid  1 mg Oral Daily  . gabapentin  300 mg Oral TID  . insulin aspart  0-20 Units Subcutaneous TID WC  . insulin glargine  80 Units Subcutaneous BID  . levothyroxine  75 mcg Oral QAC breakfast  . methocarbamol  500 mg Oral BID  . oxyCODONE  10 mg Oral Daily  . sertraline  200 mg Oral Daily   Continuous Infusions: . acyclovir 1,000 mg (09/06/17 1429)  . [START ON 09/07/2017] cefTRIAXone (ROCEPHIN)  IV    . vancomycin Stopped (09/06/17 1723)     LOS: 0 days   Marylu Lund, MD [facility] Pager [phone]  If 7PM-7AM, please contact night-coverage [email or web address] Password [facility] 09/06/2017, 6:03 PM

## 2017-09-07 DIAGNOSIS — F411 Generalized anxiety disorder: Secondary | ICD-10-CM | POA: Diagnosis not present

## 2017-09-07 LAB — BASIC METABOLIC PANEL
ANION GAP: 6 (ref 5–15)
BUN: 16 mg/dL (ref 6–20)
CALCIUM: 8.1 mg/dL — AB (ref 8.9–10.3)
CO2: 27 mmol/L (ref 22–32)
Chloride: 103 mmol/L (ref 98–111)
Creatinine, Ser: 0.92 mg/dL (ref 0.44–1.00)
Glucose, Bld: 209 mg/dL — ABNORMAL HIGH (ref 70–99)
POTASSIUM: 4.2 mmol/L (ref 3.5–5.1)
Sodium: 136 mmol/L (ref 135–145)

## 2017-09-07 LAB — GLUCOSE, CAPILLARY
GLUCOSE-CAPILLARY: 193 mg/dL — AB (ref 70–99)
GLUCOSE-CAPILLARY: 208 mg/dL — AB (ref 70–99)
GLUCOSE-CAPILLARY: 162 mg/dL — AB (ref 70–99)
Glucose-Capillary: 217 mg/dL — ABNORMAL HIGH (ref 70–99)
Glucose-Capillary: 199 mg/dL — ABNORMAL HIGH (ref 70–99)
Glucose-Capillary: 159 mg/dL — ABNORMAL HIGH (ref 70–99)

## 2017-09-07 LAB — C-REACTIVE PROTEIN: CRP: 11.7 mg/dL — AB (ref <1.0)

## 2017-09-07 LAB — SEDIMENTATION RATE: SED RATE: 42 mm/h — AB (ref 0–22)

## 2017-09-07 MED ORDER — METHYLPREDNISOLONE SODIUM SUCC 40 MG IJ SOLR
40.0000 mg | Freq: Once | INTRAMUSCULAR | Status: AC
  Administered 2017-09-07: 40 mg via INTRAVENOUS
  Filled 2017-09-07: qty 1

## 2017-09-07 MED ORDER — CEFDINIR 300 MG PO CAPS: 300.0000 mg | ORAL_CAPSULE | Freq: Two times a day (BID) | ORAL | 0 refills | Status: AC

## 2017-09-07 MED ORDER — INSULIN ASPART 100 UNIT/ML ~~LOC~~ SOLN
5.0000 [IU] | Freq: Once | SUBCUTANEOUS | Status: AC
  Administered 2017-09-07: 5 [IU] via SUBCUTANEOUS

## 2017-09-07 MED ORDER — PREDNISONE 10 MG PO TABS: 10.0000 mg | ORAL_TABLET | Freq: Every day | ORAL | Status: DC

## 2017-09-07 NOTE — Discharge Summary (Signed)
Physician Discharge Summary  Cheyenne Guzman BZJ:[id number] DOB: [birth year] DOA: 09/05/2017  PCP: Laurey Morale, MD  Admit date: 09/05/2017 Discharge date: 09/07/2017  Admitted From: Home Disposition:  Home  Recommendations for Outpatient Follow-up:  1. Follow up with PCP in 1-2 weeks 2. Follow up with rheumatologist as scheduled  Discharge Condition:Improved CODE STATUS:Full Diet recommendation: Diabetic   Brief/Interim Summary: 52 y.o.femalewith medical history significant formorbid obesity, remote history of breast cancer,Arthritis,diabetes type 2 insulin-dependent, pyelonephritis, urosepsis, and a recent history of right lower extremity cellulitis treated with Keflex, presenting with "horrible headache "accompanied by nausea, and vomiting, as well as neck pain. The patient states that she does have a history of chronic headaches, but this is very unusual for her. She also "felt confused "prior to being admitted. She had intermittent fevers, going from "97" to 101.5 prior to presentation. She is not aware of any sick contacts, however, the daughter reports that she has been at the pool, and had "algae"and did not look clean. She denies any vision changes, or photophobia. No dysphagia. She denies any chest pain or palpitations. She denies any shortness of breath or cough. The patient denies any abdominal pain, or diarrhea. She denies any dysuria or gross hematuria. As for her legs, she denies any worsening leg swelling, and the redness in her right lower extremity is improving according to her. She denies any history of PE or DVT. Denies any changes in medications  Fevers possibly from inflammatory source, not septic No mental status changes. Presenting white count is normal at 10.1. Normal lactic acid. Pt s/p LP, no evidence of bacterial meningitis HSV neg Will d/c rocephin, vanc, and acyclovir Will complete empiric omnicef for now as patient reports prior hx of of  UTI UA currently neg Blood cx neg x2 Resp viral panel neg Patient with hx of recently diagnosed RA. ESR elevated in the 40's. CRP elevated to 11.7 Given trial of solumedrol with clinical improvement Will discharge with steroid taper. Advise resuming methotrexate in 5-7 days  Anxiety and depression Continue Xanax 3 times daily as needed and Zoloft Stable at present  Type II Diabetes  Continue Neurontin as tolerated  History of asthma, no wheezing on exam OSA Continue home Proventil at every 4 hours as needed for shortness of breath and wheezing Continue CPAP On minimal O2 support  Hypothyroidism: Continue home Synthroid as tolerated  History of chronic pain syndrome, continue home medications with oxycodone as needed, muscle relaxers, Neurontin Continue view brain [place] pain weekly, due for September 08, 2017 if the patient remains in the hospital. She is on MTX weekly, not ordered at this time Seems stable Consulted PT with no PT needs identified  ARF not present on admission -Cr peak of 1.21, 28.7% rise in Cr from presenting Cr -resolved with IVF hydration   Discharge Diagnoses:  Principal Problem:   Acute viral syndrome Active Problems:   Anxiety state   Asthma   Morbid obesity ([place])   Obstructive sleep apnea   Migraine   Hypothyroidism   Cellulitis of right leg    Discharge Instructions  Discharge Instructions    Ambulatory referral to Physical Therapy   Complete by:  As directed      Allergies as of 09/07/2017      Reactions   Vicodin [hydrocodone-acetaminophen] Nausea And Vomiting      Medication List    TAKE these medications   albuterol (2.5 MG/3ML) 0.083% nebulizer solution Commonly known as:  PROVENTIL 1 VIAL IN NEBULIZER  EVERY 4 HOURS AS NEEDED FOR WHEEZING   albuterol 108 (90 Base) MCG/ACT inhaler Commonly known as:  PROVENTIL HFA;VENTOLIN HFA Inhale 2 puffs into the lungs every 4 (four) hours as needed for wheezing or shortness  of breath.   ALPRAZolam 1 MG tablet Commonly known as:  XANAX TAKE 1 TABLET BY MOUTH 3 TIMES A DAY What changed:    when to take this  reasons to take this   aspirin 325 MG EC tablet Take 325 mg by mouth at bedtime. Reported on 04/19/2015   Buprenorphine 15 MCG/HR Ptwk Place 1 patch onto the skin once a week.   cefdinir 300 MG capsule Commonly known as:  OMNICEF Take 1 capsule (300 mg total) by mouth every 12 (twelve) hours for 3 days.   CONTOUR NEXT MONITOR w/Device Kit 1 each by Does not apply route 2 (two) times daily. To check blood sugars twice a day.   folic acid 1 MG tablet Commonly known as:  FOLVITE Take 1 mg by mouth daily.   furosemide 40 MG tablet Commonly known as:  LASIX TAKE 1 TABLET (40 MG TOTAL) BY MOUTH DAILY. AS NEEDED FOR SWELLING. What changed:  additional instructions   gabapentin 300 MG capsule Commonly known as:  NEURONTIN Take 300 mg by mouth 3 (three) times daily.   glucose blood test strip Use as instructed   glucose blood test strip Use to test blood sugar 2 times daily   Insulin Glargine 100 UNIT/ML Solostar Pen Commonly known as:  LANTUS Inject 250 Units into the skin every morning. and pen needles 3/day What changed:    how much to take  additional instructions   insulin lispro 100 UNIT/ML injection Commonly known as:  HUMALOG Inject 0-50 Units into the skin 3 (three) times daily with meals. Patient reports she takes Humalog 0-50 untis with meals (dose depends on glucose and meal)   levothyroxine 75 MCG tablet Commonly known as:  SYNTHROID, LEVOTHROID Take 1 tablet (75 mcg total) by mouth daily.   methocarbamol 750 MG tablet Commonly known as:  ROBAXIN Take 1 tablet (750 mg total) by mouth 2 (two) times daily. What changed:  when to take this   methotrexate (PF) 50 MG/2ML injection Inject 15 mg into the vein once a week. 0.6 cc   Oxycodone HCl 10 MG Tabs Take 10 mg by mouth daily as needed (breakthrough pain).    predniSONE 10 MG tablet Commonly known as:  DELTASONE Take 1 tablet (10 mg total) by mouth daily. Taper dose: '40mg'$  po x 1 day then '20mg'$  po x 1 day, then '10mg'$  po x 1 day. Zero refills Start taking on:  09/08/2017   sertraline 100 MG tablet Commonly known as:  ZOLOFT TAKE 2 TABLETS (200 MG TOTAL) BY MOUTH AT BEDTIME. What changed:  when to take this      Follow-up Information    Laurey Morale, MD. Schedule an appointment as soon as possible for a visit in 2 week(s).   Specialty:  Family Medicine Contact information: [place] [address] [phone]        Follow up with your rheumatologist as scheduled Follow up.          Allergies  Allergen Reactions  . Vicodin [Hydrocodone-Acetaminophen] Nausea And Vomiting     Procedures/Studies: Ct Head Wo Contrast  Result Date: 09/05/2017 CLINICAL DATA:  52 year old female with fever, headache, neck and back pain. Recently treated for cellulitis. EXAM: CT HEAD WITHOUT CONTRAST TECHNIQUE: Contiguous  axial images were obtained from the base of the skull through the vertex without intravenous contrast. COMPARISON:  Brain MRI 07/20/2013.  Head CT 10/02/2009. FINDINGS: Brain: Stable cerebral volume since 2015, within normal limits. No midline shift, ventriculomegaly, mass effect, evidence of mass lesion, intracranial hemorrhage or evidence of cortically based acute infarction. Gray-white matter differentiation is within normal limits throughout the brain. Mild chronic dystrophic calcifications at the globus pallidus. Vascular: Mild Calcified atherosclerosis at the skull base. No suspicious intracranial vascular hyperdensity. Skull: Negative. Sinuses/Orbits: Paranasal sinuses and mastoids are stable and clear. Other: Visualized orbits and scalp soft tissues are within normal limits. Negative visible noncontrast deep soft tissue spaces of the face. IMPRESSION: Normal noncontrast Head CT. Electronically Signed   By: Genevie Ann  M.D.   On: 09/05/2017 09:55   Dg Chest Port 1 View  Result Date: 09/05/2017 CLINICAL DATA:  Sudden fever spike. Pain in the neck and back. Recent treatment for cellulitis. Shortness of breath. EXAM: PORTABLE CHEST 1 VIEW COMPARISON:  09/10/2016 FINDINGS: Shallow inspiration. Linear atelectasis in the lung bases. Cardiac enlargement. Pulmonary vascularity is normal. No airspace disease or consolidation in the lungs. No blunting of costophrenic angles. No pneumothorax. Mediastinal contours appear intact. IMPRESSION: Shallow inspiration with linear atelectasis in the lung bases. Cardiac enlargement. No focal consolidation. Electronically Signed   By: Lucienne Capers M.D.   On: 09/05/2017 06:17   Dg Fluoro Guide Lumbar Puncture  Result Date: 09/05/2017 CLINICAL DATA:  Headache and fever rule out meningitis EXAM: DIAGNOSTIC LUMBAR PUNCTURE UNDER FLUOROSCOPIC GUIDANCE FLUOROSCOPY TIME:  Fluoroscopy Time:  0 minutes 48 seconds Radiation Exposure Index (if provided by the fluoroscopic device): Number of Acquired Spot Images: 0 PROCEDURE: Informed consent was obtained from the patient prior to the procedure, including potential complications of headache, allergy, and pain. With the patient prone, the lower back was prepped with Betadine. 1% Lidocaine was used for local anesthesia. Lumbar puncture was performed at the L4-5 level using a 20 gauge needle with return of clear CSF with an opening pressure of 15 cm water. Twelve ml of CSF were obtained for laboratory studies. The patient tolerated the procedure well and there were no apparent complications. Lumbar puncture performed at L4-5 due to prior laminectomy and fusion at this level. Lumbar puncture was quite difficult due to bony overgrowth IMPRESSION: Difficult but successful lumbar puncture. Clear CSF sent to laboratory for evaluation. Electronically Signed   By: Franchot Gallo M.D.   On: 09/05/2017 13:26     Subjective: Feels better. Eager to go  home  Discharge Exam: Vitals:   09/07/17 0911 09/07/17 1211  BP: (!) 161/77 (!) 151/79  Pulse: 87 81  Resp: 20 18  Temp: 99.7 F (37.6 C) 98.7 F (37.1 C)  SpO2: (!) 89% 95%   Vitals:   09/06/17 2300 09/07/17 0308 09/07/17 0911 09/07/17 1211  BP: (!) 144/64 124/60 (!) 161/77 (!) 151/79  Pulse: 91 82 87 81  Resp: '18 18 20 18  '$ Temp: 99.1 F (37.3 C) 98.5 F (36.9 C) 99.7 F (37.6 C) 98.7 F (37.1 C)  TempSrc: Oral Oral Oral Oral  SpO2: 94% 99% (!) 89% 95%  Weight:  (!) 204 kg    Height:        General: Pt is alert, awake, not in acute distress Cardiovascular: RRR, S1/S2 +, no rubs, no gallops Respiratory: CTA bilaterally, no wheezing, no rhonchi Abdominal: Soft, NT, ND, bowel sounds + Extremities: no edema, no cyanosis   The results of significant  diagnostics from this hospitalization (including imaging, microbiology, ancillary and laboratory) are listed below for reference.     Microbiology: Recent Results (from the past 240 hour(s))  Blood Culture (routine x 2)     Status: None (Preliminary result)   Collection Time: 09/05/17  5:00 AM  Result Value Ref Range Status   Specimen Description BLOOD LEFT HAND  Final   Special Requests   Final    BOTTLES DRAWN AEROBIC AND ANAEROBIC Blood Culture results may not be optimal due to an inadequate volume of blood received in culture bottles   Culture   Final    NO GROWTH 2 DAYS Performed at [facility], [place] [address], [place], [place] [address]    Report Status PENDING  Incomplete  Blood Culture (routine x 2)     Status: None (Preliminary result)   Collection Time: 09/05/17  5:17 AM  Result Value Ref Range Status   Specimen Description BLOOD RIGHT HAND  Final   Special Requests   Final    BOTTLES DRAWN AEROBIC AND ANAEROBIC Blood Culture results may not be optimal due to an excessive volume of blood received in culture bottles   Culture   Final    NO GROWTH 2 DAYS Performed at [facility], [place]  [address] [place], [place] [address]    Report Status PENDING  Incomplete  Anaerobic culture     Status: None (Preliminary result)   Collection Time: 09/05/17 12:55 PM  Result Value Ref Range Status   Specimen Description CSF  Final   Special Requests NONE  Final   Culture   Final    NO GROWTH 2 DAYS NO ANAEROBES ISOLATED; CULTURE IN PROGRESS FOR 5 DAYS Performed at [facility], [place] [address] [place], [place] [address]    Report Status PENDING  Incomplete  CSF culture     Status: None (Preliminary result)   Collection Time: 09/05/17 12:55 PM  Result Value Ref Range Status   Specimen Description CSF  Final   Special Requests NONE  Final   Gram Stain NO WBC SEEN NO ORGANISMS SEEN   Final   Culture   Final    NO GROWTH 2 DAYS Performed at [facility], [place] [address], [place], [place] [address]    Report Status PENDING  Incomplete  Respiratory Panel by PCR     Status: None   Collection Time: 09/05/17  4:38 PM  Result Value Ref Range Status   Adenovirus NOT DETECTED NOT DETECTED Final   Coronavirus 229E NOT DETECTED NOT DETECTED Final   Coronavirus HKU1 NOT DETECTED NOT DETECTED Final   Coronavirus NL63 NOT DETECTED NOT DETECTED Final   Coronavirus OC43 NOT DETECTED NOT DETECTED Final   Metapneumovirus NOT DETECTED NOT DETECTED Final   Rhinovirus / Enterovirus NOT DETECTED NOT DETECTED Final   Influenza A NOT DETECTED NOT DETECTED Final   Influenza B NOT DETECTED NOT DETECTED Final   Parainfluenza Virus 1 NOT DETECTED NOT DETECTED Final   Parainfluenza Virus 2 NOT DETECTED NOT DETECTED Final   Parainfluenza Virus 3 NOT DETECTED NOT DETECTED Final   Parainfluenza Virus 4 NOT DETECTED NOT DETECTED Final   Respiratory Syncytial Virus NOT DETECTED NOT DETECTED Final   Bordetella pertussis NOT DETECTED NOT DETECTED Final   Chlamydophila pneumoniae NOT DETECTED NOT DETECTED Final   Mycoplasma pneumoniae NOT DETECTED NOT DETECTED Final    Comment: Performed at [facility], [place],  [place] [address]  Culture, Urine     Status: None   Collection Time: 09/05/17  4:48 PM  Result Value Ref Range Status   Specimen Description URINE, RANDOM  Final   Special Requests NONE  Final   Culture   Final    NO GROWTH Performed at [facility], [address] [place], [place] [address]    Report Status 09/06/2017 FINAL  Final     Labs: BNP (last 3 results) Recent Labs    09/10/16 2010  BNP 32.9   Basic Metabolic Panel: Recent Labs  Lab 09/05/17 0500 09/06/17 0528 09/06/17 1839 09/07/17 0505  NA 139 136  --  136  K 4.9 4.3  --  4.2  CL 101 102  --  103  CO2 26 28  --  27  GLUCOSE 192* 238*  --  209*  BUN 12 21*  --  16  CREATININE 0.94 1.21* 1.09* 0.92  CALCIUM 9.4 8.0*  --  8.1*   Liver Function Tests: Recent Labs  Lab 09/05/17 0500  AST 35  ALT 21  ALKPHOS 85  BILITOT 1.1  PROT 7.2  ALBUMIN 3.8   No results for input(s): LIPASE, AMYLASE in the last 168 hours. No results for input(s): AMMONIA in the last 168 hours. CBC: Recent Labs  Lab 09/05/17 0500 09/06/17 0528 09/06/17 1839  WBC 10.1 7.1 5.2  NEUTROABS 8.7*  --   --   HGB 13.4 10.9* 11.9*  HCT 44.1 35.8* 41.2  MCV 83.5 84.4 88.0  PLT PLATELET CLUMPS NOTED ON SMEAR, COUNT APPEARS ADEQUATE 128* 127*   Cardiac Enzymes: No results for input(s): CKTOTAL, CKMB, CKMBINDEX, TROPONINI in the last 168 hours. BNP: Invalid input(s): POCBNP CBG: Recent Labs  Lab 09/07/17 0628 09/07/17 0822 09/07/17 1138 09/07/17 1314 09/07/17 1502  GLUCAP 193* 208* 217* 199* 162*   D-Dimer No results for input(s): DDIMER in the last 72 hours. Hgb A1c Recent Labs    09/05/17 1421  HGBA1C 9.4*   Lipid Profile No results for input(s): CHOL, HDL, LDLCALC, TRIG, CHOLHDL, LDLDIRECT in the last 72 hours. Thyroid function studies No results for input(s): TSH, T4TOTAL, T3FREE, THYROIDAB in the last 72 hours.  Invalid input(s): FREET3 Anemia work up No results for  input(s): VITAMINB12, FOLATE, FERRITIN, TIBC, IRON, RETICCTPCT in the last 72 hours. Urinalysis    Component Value Date/Time   COLORURINE YELLOW 09/05/2017 0607   APPEARANCEUR CLEAR 09/05/2017 0607   APPEARANCEUR Clear 07/31/2013 1404   LABSPEC 1.021 09/05/2017 0607   PHURINE 7.0 09/05/2017 0607   GLUCOSEU NEGATIVE 09/05/2017 0607   HGBUR NEGATIVE 09/05/2017 0607   HGBUR negative 09/28/2009 1044   BILIRUBINUR NEGATIVE 09/05/2017 0607   BILIRUBINUR Positive 08/09/2017 1702   BILIRUBINUR Negative 07/31/2013 1404   KETONESUR NEGATIVE 09/05/2017 0607   PROTEINUR NEGATIVE 09/05/2017 0607   UROBILINOGEN 1.0 08/09/2017 1702   UROBILINOGEN 0.2 07/03/2012 1027   NITRITE NEGATIVE 09/05/2017 0607   LEUKOCYTESUR NEGATIVE 09/05/2017 0607   LEUKOCYTESUR Negative 07/31/2013 1404   Sepsis Labs Invalid input(s): PROCALCITONIN,  WBC,  LACTICIDVEN Microbiology Recent Results (from the past 240 hour(s))  Blood Culture (routine x 2)     Status: None (Preliminary result)   Collection Time: 09/05/17  5:00 AM  Result Value Ref Range Status   Specimen Description BLOOD LEFT HAND  Final   Special Requests   Final    BOTTLES DRAWN AEROBIC AND ANAEROBIC Blood Culture results may not be optimal due to an inadequate volume of  blood received in culture bottles   Culture   Final    NO GROWTH 2 DAYS Performed at [facility], [place] [address], [place], [place] [address]    Report Status PENDING  Incomplete  Blood Culture (routine x 2)     Status: None (Preliminary result)   Collection Time: 09/05/17  5:17 AM  Result Value Ref Range Status   Specimen Description BLOOD RIGHT HAND  Final   Special Requests   Final    BOTTLES DRAWN AEROBIC AND ANAEROBIC Blood Culture results may not be optimal due to an excessive volume of blood received in culture bottles   Culture   Final    NO GROWTH 2 DAYS Performed at [facility], [place] [address], [place], [place] [address]    Report Status PENDING   Incomplete  Anaerobic culture     Status: None (Preliminary result)   Collection Time: 09/05/17 12:55 PM  Result Value Ref Range Status   Specimen Description CSF  Final   Special Requests NONE  Final   Culture   Final    NO GROWTH 2 DAYS NO ANAEROBES ISOLATED; CULTURE IN PROGRESS FOR 5 DAYS Performed at [facility], [place] [address] [place], [place] [address]    Report Status PENDING  Incomplete  CSF culture     Status: None (Preliminary result)   Collection Time: 09/05/17 12:55 PM  Result Value Ref Range Status   Specimen Description CSF  Final   Special Requests NONE  Final   Gram Stain NO WBC SEEN NO ORGANISMS SEEN   Final   Culture   Final    NO GROWTH 2 DAYS Performed at [facility], [place] [address], [place], [place] [address]    Report Status PENDING  Incomplete  Respiratory Panel by PCR     Status: None   Collection Time: 09/05/17  4:38 PM  Result Value Ref Range Status   Adenovirus NOT DETECTED NOT DETECTED Final   Coronavirus 229E NOT DETECTED NOT DETECTED Final   Coronavirus HKU1 NOT DETECTED NOT DETECTED Final   Coronavirus NL63 NOT DETECTED NOT DETECTED Final   Coronavirus OC43 NOT DETECTED NOT DETECTED Final   Metapneumovirus NOT DETECTED NOT DETECTED Final   Rhinovirus / Enterovirus NOT DETECTED NOT DETECTED Final   Influenza A NOT DETECTED NOT DETECTED Final   Influenza B NOT DETECTED NOT DETECTED Final   Parainfluenza Virus 1 NOT DETECTED NOT DETECTED Final   Parainfluenza Virus 2 NOT DETECTED NOT DETECTED Final   Parainfluenza Virus 3 NOT DETECTED NOT DETECTED Final   Parainfluenza Virus 4 NOT DETECTED NOT DETECTED Final   Respiratory Syncytial Virus NOT DETECTED NOT DETECTED Final   Bordetella pertussis NOT DETECTED NOT DETECTED Final   Chlamydophila pneumoniae NOT DETECTED NOT DETECTED Final   Mycoplasma pneumoniae NOT DETECTED NOT DETECTED Final    Comment: Performed at [facility], [place] [address], [place], [place] [address]   Culture, Urine     Status: None   Collection Time: 09/05/17  4:48 PM  Result Value Ref Range Status   Specimen Description URINE, RANDOM  Final   Special Requests NONE  Final   Culture   Final    NO GROWTH Performed at [facility], [place] [address] [place],  [address]    Report Status 09/06/2017 FINAL  Final   Time spent: 52mn  SIGNED:   SMarylu Lund MD  [facility] 09/07/2017, 3:51 PM  If 7PM-7AM, please contact night-coverage [email or web address] Password [email or web address]

## 2017-09-07 NOTE — Progress Notes (Signed)
Discussed with patient and daughter discharge instructions, both verbalized agreement and understanding. Patient to go home with all belongings in a private car.

## 2017-09-07 NOTE — Evaluation (Signed)
Physical Therapy Evaluation Patient Details Name: Cheyenne Guzman MRN: [id number] DOB: 1965-07-12 Today's Date: 09/07/2017   History of Present Illness  52 y.o. female with medical history significant for morbid obesity, remote history of breast cancer,  Arthritis, diabetes type 2 insulin-dependent,  pyelonephritis, urosepsis, and a recent history of right lower extremity cellulitis treated with Keflex, presenting with "horrible headache "accompanied by nausea, and vomiting, as well as neck pain. Pt with fevers, meningitis has been ruled out.   Clinical Impression  Pt admitted with above diagnosis. Pt currently with functional limitations due to the deficits listed below (see PT Problem List). Pt has neck pain that is worse with cervical extension, pt feels stiff with all motion but range is not limited. She has recently been diagnosed with RA and has not begun treatment. She has LLE weakness and sensory changes that are related to her back and she is awaiting an MRI. Her ambulation is limited to 20-25' due to LLE pain and weakness. Recommend that she see an outpt PT but not until these acute events are resolved and she begins her methotrexate.  Pt will benefit from skilled PT to increase their independence and safety with mobility to allow discharge to the venue listed below.       Follow Up Recommendations Outpatient PT    Equipment Recommendations  None recommended by PT    Recommendations for Other Services       Precautions / Restrictions Precautions Precautions: Fall Restrictions Weight Bearing Restrictions: No      Mobility  Bed Mobility               General bed mobility comments: pt received sitting EOB  Transfers Overall transfer level: Needs assistance Equipment used: Rolling walker (2 wheeled) Transfers: Sit to/from [facility] Sit to Stand: Min guard Stand pivot transfers: Min guard       General transfer comment: pt can only maintain  standing 1-2 minutes die to back pain. Reliant on RLE for sit to stand.   Ambulation/Gait Ambulation/Gait assistance: Min guard Gait Distance (Feet): 4 Feet Assistive device: Rolling walker (2 wheeled) Gait Pattern/deviations: Step-through pattern;Decreased step length - right;Decreased stance time - left;Wide base of support Gait velocity: decreased Gait velocity interpretation: <1.31 ft/sec, indicative of household ambulator General Gait Details: pt reports she cannot walk more than 15' at a time without needing to sit due to back pain. Wide BOS. Given bariatric RW for use in room   Stairs            Wheelchair Mobility    Modified Rankin (Stroke Patients Only)       Balance Overall balance assessment: Mild deficits observed, not formally tested                                           Pertinent Vitals/Pain Pain Assessment: Faces Faces Pain Scale: Hurts even more Pain Location: had and neck especially with cervical extension Pain Descriptors / Indicators: Aching;Constant Pain Intervention(s): Limited activity within patient's tolerance;Monitored during session    Home Living Family/patient expects to be discharged to:: Private residence Living Arrangements: Children;Spouse/significant other Available Help at Discharge: Family;Available PRN/intermittently Type of Home: House         Home Equipment: Walker - 2 wheels;Walker - 4 wheels;Cane - single point Additional Comments: pt has a pool at her home and exercises/ swims in it several  hours/ day in the summer    Prior Function Level of Independence: Needs assistance   Gait / Transfers Assistance Needed: has been ambulating with a cane  ADL's / Homemaking Assistance Needed: reports difficulty with ADL's recently due to neck pain and back pain  Comments: pt is supposed to go for another MRI because she is having LLE weakness and sensory changes. Has a h/o back surgery with RLE symptoms as well  as neurogenic bladder     Hand Dominance        Extremity/Trunk Assessment   Upper Extremity Assessment Upper Extremity Assessment: Overall WFL for tasks assessed    Lower Extremity Assessment Lower Extremity Assessment: LLE deficits/detail LLE Deficits / Details: hip flex 3/5, knee ext 3/5, knee flex 3/5, ankle grossly 3+/5 LLE Sensation: history of peripheral neuropathy;decreased proprioception LLE Coordination: decreased gross motor    Cervical / Trunk Assessment Cervical / Trunk Assessment: Kyphotic;Other exceptions Cervical / Trunk Exceptions: h/o lumbar sx  Communication   Communication: No difficulties  Cognition Arousal/Alertness: Awake/alert Behavior During Therapy: WFL for tasks assessed/performed Overall Cognitive Status: Within Functional Limits for tasks assessed                                        General Comments General comments (skin integrity, edema, etc.): pt was recently diagnosed with RA and has not started treatment.     Exercises Other Exercises Other Exercises: discussed seated exercise program inclusing marching, LAQ, hip abd/ add, and seated lumbar extension Other Exercises: discussed ways that she could continue to get into a pool in the cold months as she gets no exercsies then. [facility]   Assessment/Plan    PT Assessment Patient needs continued PT services  PT Problem List Decreased strength;Decreased activity tolerance;Decreased balance;Decreased mobility;Decreased knowledge of precautions;Pain;Impaired sensation       PT Treatment Interventions DME instruction;Gait training;Functional mobility training;Therapeutic activities;Therapeutic exercise;Patient/family education    PT Goals (Current goals can be found in the Care Plan section)  Acute Rehab PT Goals Patient Stated Goal: return to water exercise, decreased pain PT Goal Formulation: With patient Time For Goal Achievement:  09/21/17 Potential to Achieve Goals: Fair    Frequency Min 3X/week   Barriers to discharge        Co-evaluation               AM-PAC PT "6 Clicks" Daily Activity  Outcome Measure Difficulty turning over in bed (including adjusting bedclothes, sheets and blankets)?: A Lot Difficulty moving from lying on back to sitting on the side of the bed? : A Lot Difficulty sitting down on and standing up from a chair with arms (e.g., wheelchair, bedside commode, etc,.)?: A Little Help needed moving to and from a bed to chair (including a wheelchair)?: A Little Help needed walking in hospital room?: A Little Help needed climbing 3-5 steps with a railing? : Total 6 Click Score: 14    End of Session   Activity Tolerance: Patient tolerated treatment well Patient left: in chair;with call bell/phone within reach;with family/visitor present Nurse Communication: Mobility status PT Visit Diagnosis: Pain;Muscle weakness (generalized) (M62.81);Difficulty in walking, not elsewhere classified (R26.2) Pain - part of body: (neck)    Time: 5885-0277 PT Time Calculation (min) (ACUTE ONLY): 36 min   Charges:   PT Evaluation $PT Eval Moderate Complexity: 1 Mod PT Treatments $Therapeutic Activity: 8-22  mins        [facility], [place]  Acute Rehab Services  [place] 09/07/2017, 12:26 PM

## 2017-09-07 NOTE — Progress Notes (Signed)
Patient has been afebrile this evening, less drowsy and more talkative she seems to be feeling better.

## 2017-09-07 NOTE — Discharge Instructions (Signed)
Resume methotrexate on 09/13/17

## 2017-09-08 LAB — CSF CULTURE: GRAM STAIN: NONE SEEN

## 2017-09-08 LAB — CSF CULTURE W GRAM STAIN: Culture: NO GROWTH

## 2017-09-09 LAB — OLIGOCLONAL BANDS, CSF + SERM

## 2017-09-10 ENCOUNTER — Other Ambulatory Visit: Payer: Self-pay | Admitting: Rehabilitation

## 2017-09-10 DIAGNOSIS — M4326 Fusion of spine, lumbar region: Secondary | ICD-10-CM

## 2017-09-10 LAB — CULTURE, BLOOD (ROUTINE X 2)
CULTURE: NO GROWTH
Culture: NO GROWTH

## 2017-09-10 LAB — ANAEROBIC CULTURE

## 2017-09-19 ENCOUNTER — Other Ambulatory Visit: Payer: BLUE CROSS/BLUE SHIELD

## 2017-10-04 LAB — FUNGUS CULTURE WITH STAIN

## 2017-10-04 LAB — FUNGAL ORGANISM REFLEX

## 2017-10-04 LAB — FUNGUS CULTURE RESULT

## 2017-10-16 ENCOUNTER — Encounter: Payer: Self-pay | Admitting: Endocrinology

## 2017-10-16 ENCOUNTER — Ambulatory Visit: Payer: BLUE CROSS/BLUE SHIELD | Admitting: Endocrinology

## 2017-10-16 VITALS — BP 128/66 | HR 90 | Ht 67.0 in | Wt >= 6400 oz

## 2017-10-16 DIAGNOSIS — E119 Type 2 diabetes mellitus without complications: Secondary | ICD-10-CM | POA: Diagnosis not present

## 2017-10-16 MED ORDER — INSULIN GLARGINE 100 UNIT/ML SOLOSTAR PEN: 280.0000 [IU] | PEN_INJECTOR | SUBCUTANEOUS | 11 refills | Status: DC

## 2017-10-16 NOTE — Patient Instructions (Addendum)
Please increase the insulin to 280 units each morning. This will reduce the need for humalog.  I have sent a prescription to your pharmacy.   check your blood sugar twice a day.  vary the time of day when you check, between before the 3 meals, and at bedtime.  also check if you have symptoms of your blood sugar being too high or too low.  please keep a record of the readings and bring it to your next appointment here (or you can bring the meter itself).  You can write it on any piece of paper.  please call us sooner if your blood sugar goes below 70, or if you have a lot of readings over 200.   Please come back for a follow-up appointment in 2 months.      Bariatric Surgery You have so much to gain by losing weight.  You may have already tried every diet and exercise plan imaginable.  And, you may have sought advice from your family physician, too.   Sometimes, in spite of such diligent efforts, you may not be able to achieve long-term results by yourself.  In cases of severe obesity, bariatric or weight loss surgery is a proven method of achieving long-term weight control.  Our Services Our bariatric surgery programs offer our patients new hope and long-term weight-loss solution.  Since introducing our services in 2003, we have conducted more than 2,400 successful procedures.  Our program is designated as a Programmer, multimedia by the Metabolic and Bariatric Surgery Accreditation and Quality Improvement Program (MBSAQIP), a IT trainer that sets rigorous patient safety and outcome standards.  Our program is also designated as a Ecologist by [employer].   Our exceptional weight-loss surgery team specializes in diagnosis, treatment, follow-up care, and ongoing support for our patients with severe weight loss challenges.  We currently offer laparoscopic sleeve gastrectomy, gastric bypass, and adjustable gastric band (LAP-BAND).    Attend our [place] Choosing to undergo a bariatric procedure is a big decision, and one that should not be taken lightly.  You now have two options in how you learn about weight-loss surgery - in person or online.  Our objective is to ensure you have all of the information that you need to evaluate the advantages and obligations of this life changing procedure.  Please note that you are not alone in this process, and our experienced team is ready to assist and answer all of your questions.  There are several ways to register for a seminar (either on-line or in person): 1)  Call [phone] 2) Go on-line to [facility] and register for either type of seminar.  [email or web address]

## 2017-10-16 NOTE — Progress Notes (Signed)
Subjective:    Patient ID: Cheyenne Guzman, female    DOB: Jan 11, 1966, 52 y.o.   MRN: [id number]  HPI Pt is referred by Dr Sarajane Jews, for hypothyroidism (she was dx'ed with hyperthyroidism in 2011; she has never had thyroid imaging; in 2018, she spontaneously developed hypothyroidism). She has been on synthroid since then.  Pt returns for f/u of diabetes mellitus:  DM type: Insulin-requiring type 2 Dx'ed: 9150 Complications: polyneuropathy, TIA and retinopathy.   Therapy: insulin since 2009.   GDM: never. DKA: never.  Severe hypoglycemia: never.  Pancreatitis: never Other: she declines weight loss surgery; she was changed to qd insulin, after poor results with multiple daily injections.   Interval history: She recently took prednisone for arthralgias.  Since then, cbg's vary from 130-220.  She takes lantus, 250/d, and PRN humalog (averages a total of 30 units per day).  Past Medical History:  Diagnosis Date  . Anxiety    per pt. - mild   . Arthritis    back & ankles   . Asthma    seasonal   . Cancer [facility])    breast, sees Dr. Jana Hakim , Left - 11/2007  . Complication of anesthesia 2013   severe muscle spasms-sore-no doc  . Depression   . Diabetes mellitus    sees Dr. Chalmers Cater, diagnosed 2003  . HA (headache)   . Hearing loss    Went to [facility]  . Hyperlipidemia   . Kidney infection    - hosp. [facility]- septic- 10/2009  . Low back pain   . Peripheral neuropathy    R sided numbness- face & jaw  . Sleep apnea 9/15   severe-just started cpap-doing well  . TIA (transient ischemic attack)    in 2007,2008, and 2011  . Tinnitus of right ear   . Urosepsis 08-2009   with Klebsiella    Past Surgical History:  Procedure Laterality Date  . ABDOMINAL HYSTERECTOMY  08/2004  . BREAST BIOPSY  06/22/2011   Procedure: BREAST BIOPSY;  Surgeon: Stark Klein, MD;  Location: [place];  Service: General;  Laterality: Right;  . BREAST SURGERY  11/2007   left  ductal carcinoma in situ  . CARPAL TUNNEL RELEASE Right 10/13/2013   Procedure: RIGHT CARPAL TUNNEL RELEASE;  Surgeon: Hessie Dibble, MD;  Location: [place];  Service: Orthopedics;  Laterality: Right;  . CARPAL TUNNEL RELEASE Left 11/24/2013   Procedure: LEFT CARPAL TUNNEL RELEASE WITH CYST EXCISION ;  Surgeon: Hessie Dibble, MD;  Location: [place];  Service: Orthopedics;  Laterality: Left;  . EAR CYST EXCISION Left 11/24/2013   Procedure: CYST REMOVAL;  Surgeon: Hessie Dibble, MD;  Location: [place];  Service: Orthopedics;  Laterality: Left;  . FOOT SURGERY  2000   plantar fasciitis-left  . LUMBAR DISC SURGERY  03-29-14   Fusion, revision 04-27-14 L4-5 Cauda Equina with graft  . NERVE GRAFT  06-01-08   cadaver graft to right inferior alveolar nerve  in [place]    Social History   Socioeconomic History  . Marital status: Married    Spouse name: [name]  . Number of children: 2  . Years of education: 27  . Highest education level: Not on file  Occupational History    Comment: [employer]  . Financial resource strain: Not on file  . Food insecurity:    Worry: Not on file    Inability:  Not on file  . Transportation needs:    Medical: Not on file    Non-medical: Not on file  Tobacco Use  . Smoking status: Former Smoker    Last attempt to quit: 02/18/1997    Years since quitting: 20.6  . Smokeless tobacco: Never Used  Substance and Sexual Activity  . Alcohol use: Yes    Comment: rare  . Drug use: No  . Sexual activity: Not on file  Lifestyle  . Physical activity:    Days per week: Not on file    Minutes per session: Not on file  . Stress: Not on file  Relationships  . Social connections:    Talks on phone: Not on file    Gets together: Not on file    Attends religious service: Not on file    Active member of club or organization: Not on file    Attends meetings of clubs or organizations: Not  on file    Relationship status: Not on file  . Intimate partner violence:    Fear of current or ex partner: Not on file    Emotionally abused: Not on file    Physically abused: Not on file    Forced sexual activity: Not on file  Other Topics Concern  . Not on file  Social History Narrative   Patient lives at home with her husband [name]) and two daughters.    Patient works for [employer] full time patient is on medical leave at this time.   Education two years of college.   Right handed.   Caffeine two times per day.    Current Outpatient Medications on File Prior to Visit  Medication Sig Dispense Refill  . albuterol (PROVENTIL HFA;VENTOLIN HFA) 108 (90 Base) MCG/ACT inhaler Inhale 2 puffs into the lungs every 4 (four) hours as needed for wheezing or shortness of breath. 1 Inhaler 11  . albuterol (PROVENTIL) (2.5 MG/3ML) 0.083% nebulizer solution 1 VIAL IN NEBULIZER EVERY 4 HOURS AS NEEDED FOR WHEEZING 75 mL 1  . ALPRAZolam (XANAX) 1 MG tablet TAKE 1 TABLET BY MOUTH 3 TIMES A DAY (Patient taking differently: Take 1 mg by mouth 3 (three) times daily as needed for anxiety. ) 90 tablet 5  . aspirin 325 MG EC tablet Take 325 mg by mouth at bedtime. Reported on 04/19/2015    . Blood Glucose Monitoring Suppl (CONTOUR NEXT MONITOR) w/Device KIT 1 each by Does not apply route 2 (two) times daily. To check blood sugars twice a day. 1 kit 0  . Buprenorphine 15 MCG/HR PTWK Place 1 patch onto the skin once a week.   0  . folic acid (FOLVITE) 1 MG tablet Take 1 mg by mouth daily.  0  . furosemide (LASIX) 40 MG tablet TAKE 1 TABLET (40 MG TOTAL) BY MOUTH DAILY. AS NEEDED FOR SWELLING. (Patient taking differently: Take 40 mg by mouth daily. ) 30 tablet 6  . gabapentin (NEURONTIN) 300 MG capsule Take 300 mg by mouth 3 (three) times daily.     Marland Kitchen glucose blood (CONTOUR NEXT TEST) test strip Use to test blood sugar 2 times daily 100 each 12  . glucose blood test strip Use as instructed 100 each 0  .  levothyroxine (SYNTHROID, LEVOTHROID) 75 MCG tablet Take 1 tablet (75 mcg total) by mouth daily. 90 tablet 3  . methocarbamol (ROBAXIN) 750 MG tablet Take 1 tablet (750 mg total) by mouth 2 (two) times daily. (Patient taking differently: Take 750 mg by  mouth 3 (three) times daily. ) 60 tablet 5  . Methotrexate Sodium (METHOTREXATE, PF,) 50 MG/2ML injection Inject 15 mg into the vein once a week. 0.6 cc  0  . Oxycodone HCl 10 MG TABS Take 10 mg by mouth daily as needed (breakthrough pain).     Marland Kitchen sertraline (ZOLOFT) 100 MG tablet TAKE 2 TABLETS (200 MG TOTAL) BY MOUTH AT BEDTIME. (Patient taking differently: Take 200 mg by mouth daily. ) 180 tablet 3   No current facility-administered medications on file prior to visit.     Allergies  Allergen Reactions  . Vicodin [Hydrocodone-Acetaminophen] Nausea And Vomiting    Family History  Problem Relation Age of Onset  . Cancer Mother        breast  . Cancer Father        lung  . Diabetes Father   . Cancer Sister        colon  . Cancer Maternal Aunt        breast - both  . Diabetes Sister   . Anesthesia problems Neg Hx     BP 128/66   Pulse 90   Ht '5\' 7"'  (1.702 m)   Wt (!) 427 lb 3.2 oz (193.8 kg)   SpO2 95%   BMI 66.91 kg/m   Review of Systems She denies hypoglycemia.      Objective:   Physical Exam VITAL SIGNS:  See vs page GENERAL: no distress.  Morbid obesity.   Pulses: foot pulses are intact bilaterally.   MSK: no deformity of the feet or ankles.  CV: 1+ bilat edema of the legs.  Skin:  no ulcer on the feet or ankles, but the skin is dry.  normal color and temp on the feet. Hyperpigmentation of the legs.  Neuro: sensation is intact to touch on the feet and ankles.    Lab Results  Component Value Date   HGBA1C 9.4 (H) 09/05/2017       Assessment & Plan:  Insulin-requiring type 2 DM, with DR Obesity: persistent:we discussed again.  She again declines surgery.   Arthralgias: steroid rx is affecting a1c.  Patient  Instructions  Please increase the insulin to 280 units each morning. This will reduce the need for humalog.  I have sent a prescription to your pharmacy.   check your blood sugar twice a day.  vary the time of day when you check, between before the 3 meals, and at bedtime.  also check if you have symptoms of your blood sugar being too high or too low.  please keep a record of the readings and bring it to your next appointment here (or you can bring the meter itself).  You can write it on any piece of paper.  please call us sooner if your blood sugar goes below 70, or if you have a lot of readings over 200.   Please come back for a follow-up appointment in 2 months.      Bariatric Surgery You have so much to gain by losing weight.  You may have already tried every diet and exercise plan imaginable.  And, you may have sought advice from your family physician, too.   Sometimes, in spite of such diligent efforts, you may not be able to achieve long-term results by yourself.  In cases of severe obesity, bariatric or weight loss surgery is a proven method of achieving long-term weight control.  Our Services Our bariatric surgery programs offer our patients new hope and long-term weight-loss solution.  Since introducing our services in 2003, we have conducted more than 2,400 successful procedures.  Our program is designated as a Programmer, multimedia by the Metabolic and Bariatric Surgery Accreditation and Quality Improvement Program (MBSAQIP), a IT trainer that sets rigorous patient safety and outcome standards.  Our program is also designated as a Ecologist by [employer].   Our exceptional weight-loss surgery team specializes in diagnosis, treatment, follow-up care, and ongoing support for our patients with severe weight loss challenges.  We currently offer laparoscopic sleeve gastrectomy, gastric bypass, and adjustable gastric band (LAP-BAND).    Attend our  [place] Choosing to undergo a bariatric procedure is a big decision, and one that should not be taken lightly.  You now have two options in how you learn about weight-loss surgery - in person or online.  Our objective is to ensure you have all of the information that you need to evaluate the advantages and obligations of this life changing procedure.  Please note that you are not alone in this process, and our experienced team is ready to assist and answer all of your questions.  There are several ways to register for a seminar (either on-line or in person): 1)  Call [phone] 2) Go on-line to [facility] and register for either type of seminar.  [email or web address]

## 2017-10-30 ENCOUNTER — Other Ambulatory Visit: Payer: Self-pay | Admitting: Rehabilitation

## 2017-10-30 ENCOUNTER — Other Ambulatory Visit: Payer: Self-pay | Admitting: Orthopaedic Surgery

## 2017-10-30 DIAGNOSIS — M4326 Fusion of spine, lumbar region: Secondary | ICD-10-CM

## 2017-10-31 ENCOUNTER — Telehealth: Payer: Self-pay | Admitting: Family Medicine

## 2017-10-31 ENCOUNTER — Other Ambulatory Visit (INDEPENDENT_AMBULATORY_CARE_PROVIDER_SITE_OTHER): Payer: BLUE CROSS/BLUE SHIELD

## 2017-10-31 DIAGNOSIS — R300 Dysuria: Secondary | ICD-10-CM

## 2017-10-31 LAB — POCT URINALYSIS DIPSTICK
Glucose, UA: NEGATIVE
KETONES UA: NEGATIVE
NITRITE UA: NEGATIVE
PROTEIN UA: POSITIVE — AB
Urobilinogen, UA: 0.2 E.U./dL
pH, UA: 5.5 (ref 5.0–8.0)

## 2017-10-31 NOTE — Telephone Encounter (Signed)
Copied from [place] [phone]. Topic: Quick Communication - See Telephone Encounter >> Oct 31, 2017 11:08 AM Marja Kays F wrote: Pt is requesting that lab order be put in so that she can come to the lab and give a urine sample for possible UTI  Best number [phone]

## 2017-10-31 NOTE — Telephone Encounter (Signed)
Please put in an order for a UA and a urine culture. She can drop off the sample

## 2017-10-31 NOTE — Telephone Encounter (Signed)
Dr. Fry please advise. Thanks  

## 2017-10-31 NOTE — Telephone Encounter (Signed)
Spoke to the pt.  Sx started on Saturday.  She complains of foul smelling urine, cloudy urine,  lower pelvic pressure and dysuria.  Denied fever, chills and flank pain.  Had back surgery in 2016 and always has back pain but feels the pain is worse with the UTI sx.  Would like to be able to come in and give a urine sample.  Please advise.

## 2017-10-31 NOTE — Addendum Note (Signed)
Addended by: Beckie Busing on: 10/31/2017 05:12 PM   Modules accepted: Orders

## 2017-11-01 MED ORDER — CIPROFLOXACIN HCL 500 MG PO TABS: 500.0000 mg | ORAL_TABLET | Freq: Two times a day (BID) | ORAL | 0 refills | Status: DC

## 2017-11-01 NOTE — Telephone Encounter (Signed)
Patient called to check the status of her urine test.  She states that she has not heard back from the doctor and would like an antibiotic called in because she is in a lot of pain.  Please advise.  CB# [phone]

## 2017-11-01 NOTE — Telephone Encounter (Signed)
Her UA definitely shows an infection. Call in Cipro 500 mg bid for 7 days

## 2017-11-01 NOTE — Addendum Note (Signed)
Addended by: Elie Confer on: 11/01/2017 05:08 PM   Modules accepted: Orders

## 2017-11-01 NOTE — Telephone Encounter (Signed)
Patient is calling to check the status of this. She would like to know if the results are back and if it was positive will she get an antibiotic called in?

## 2017-11-01 NOTE — Telephone Encounter (Signed)
Called and spoke with pt and she is aware of cipro that has been sent to the pharmacy.  Nothing further is needed.

## 2017-11-01 NOTE — Telephone Encounter (Signed)
Called the pt and she did drop off the UA yesterday at the [place].  The UA is in the computer and the cx was sent off .  Dr. Sarajane Jews please advise. thanks

## 2017-11-02 LAB — URINE CULTURE
MICRO NUMBER:: 91220340
SPECIMEN QUALITY:: ADEQUATE

## 2017-11-04 ENCOUNTER — Encounter: Payer: Self-pay | Admitting: *Deleted

## 2017-11-15 ENCOUNTER — Other Ambulatory Visit: Payer: Self-pay | Admitting: Family Medicine

## 2017-12-16 ENCOUNTER — Ambulatory Visit
Admission: RE | Admit: 2017-12-16 | Discharge: 2017-12-16 | Disposition: A | Discharge status: Home / Self Care | Payer: BLUE CROSS/BLUE SHIELD | Source: Ambulatory Visit | Attending: Orthopaedic Surgery | Admitting: Orthopaedic Surgery

## 2017-12-16 DIAGNOSIS — M4326 Fusion of spine, lumbar region: Secondary | ICD-10-CM

## 2017-12-16 MED ORDER — GADOBENATE DIMEGLUMINE 529 MG/ML IV SOLN
20.0000 mL | Freq: Once | INTRAVENOUS | Status: AC | PRN
  Administered 2017-12-16: 20 mL via INTRAVENOUS

## 2017-12-24 ENCOUNTER — Ambulatory Visit: Payer: BLUE CROSS/BLUE SHIELD | Admitting: Endocrinology

## 2018-01-01 ENCOUNTER — Encounter: Payer: Self-pay | Admitting: Family Medicine

## 2018-01-01 ENCOUNTER — Encounter: Payer: Self-pay | Admitting: Endocrinology

## 2018-01-01 NOTE — Telephone Encounter (Signed)
Dr. Fry please advise. Thanks  

## 2018-01-02 NOTE — Telephone Encounter (Signed)
Please arrange to her records sent over

## 2018-01-22 DIAGNOSIS — Z86711 Personal history of pulmonary embolism: Secondary | ICD-10-CM | POA: Diagnosis present

## 2018-01-22 DIAGNOSIS — L0390 Cellulitis, unspecified: Secondary | ICD-10-CM

## 2018-01-22 DIAGNOSIS — A419 Sepsis, unspecified organism: Secondary | ICD-10-CM

## 2018-01-22 DIAGNOSIS — I2699 Other pulmonary embolism without acute cor pulmonale: Secondary | ICD-10-CM

## 2018-01-22 HISTORY — DX: Sepsis, unspecified organism: A41.9

## 2018-01-22 HISTORY — DX: Other pulmonary embolism without acute cor pulmonale: I26.99

## 2018-01-22 HISTORY — DX: Cellulitis, unspecified: L03.90

## 2018-01-28 ENCOUNTER — Ambulatory Visit: Payer: BLUE CROSS/BLUE SHIELD | Admitting: Endocrinology

## 2018-01-31 ENCOUNTER — Other Ambulatory Visit: Payer: Self-pay | Admitting: Family Medicine

## 2018-01-31 NOTE — Telephone Encounter (Signed)
Dr. Fry please advise on refill of medication.  Thanks  

## 2018-02-03 NOTE — Telephone Encounter (Signed)
Call in #90 with 5 rf 

## 2018-02-06 DIAGNOSIS — J4520 Mild intermittent asthma, uncomplicated: Secondary | ICD-10-CM | POA: Insufficient documentation

## 2018-02-06 DIAGNOSIS — Z8673 Personal history of transient ischemic attack (TIA), and cerebral infarction without residual deficits: Secondary | ICD-10-CM | POA: Insufficient documentation

## 2018-02-26 ENCOUNTER — Other Ambulatory Visit: Payer: Self-pay | Admitting: Family Medicine

## 2018-03-03 NOTE — Telephone Encounter (Signed)
Dr. Fry please advise on refill. thanks 

## 2018-03-04 NOTE — Telephone Encounter (Signed)
Call in #90 with 5 rf 

## 2018-03-06 NOTE — Telephone Encounter (Signed)
Called the medication to the pharmacy and left on the VM with the pharmacy.

## 2018-03-11 ENCOUNTER — Encounter (HOSPITAL_COMMUNITY): Payer: Self-pay

## 2018-03-11 ENCOUNTER — Inpatient Hospital Stay (HOSPITAL_COMMUNITY)
Admission: EM | Admit: 2018-03-11 | Discharge: 2018-03-13 | DRG: 175 | Disposition: A | Discharge status: Home / Self Care | Payer: BLUE CROSS/BLUE SHIELD | Attending: Internal Medicine | Admitting: Internal Medicine

## 2018-03-11 ENCOUNTER — Emergency Department (HOSPITAL_COMMUNITY): Payer: BLUE CROSS/BLUE SHIELD

## 2018-03-11 ENCOUNTER — Other Ambulatory Visit: Payer: Self-pay

## 2018-03-11 DIAGNOSIS — Z79891 Long term (current) use of opiate analgesic: Secondary | ICD-10-CM

## 2018-03-11 DIAGNOSIS — E669 Obesity, unspecified: Secondary | ICD-10-CM

## 2018-03-11 DIAGNOSIS — Z801 Family history of malignant neoplasm of trachea, bronchus and lung: Secondary | ICD-10-CM

## 2018-03-11 DIAGNOSIS — Z8673 Personal history of transient ischemic attack (TIA), and cerebral infarction without residual deficits: Secondary | ICD-10-CM

## 2018-03-11 DIAGNOSIS — G894 Chronic pain syndrome: Secondary | ICD-10-CM | POA: Diagnosis present

## 2018-03-11 DIAGNOSIS — H9193 Unspecified hearing loss, bilateral: Secondary | ICD-10-CM | POA: Diagnosis present

## 2018-03-11 DIAGNOSIS — Z803 Family history of malignant neoplasm of breast: Secondary | ICD-10-CM

## 2018-03-11 DIAGNOSIS — Z794 Long term (current) use of insulin: Secondary | ICD-10-CM

## 2018-03-11 DIAGNOSIS — E785 Hyperlipidemia, unspecified: Secondary | ICD-10-CM | POA: Diagnosis present

## 2018-03-11 DIAGNOSIS — Z6841 Body Mass Index (BMI) 40.0 and over, adult: Secondary | ICD-10-CM

## 2018-03-11 DIAGNOSIS — B9620 Unspecified Escherichia coli [E. coli] as the cause of diseases classified elsewhere: Secondary | ICD-10-CM | POA: Diagnosis present

## 2018-03-11 DIAGNOSIS — I2699 Other pulmonary embolism without acute cor pulmonale: Secondary | ICD-10-CM | POA: Diagnosis not present

## 2018-03-11 DIAGNOSIS — Z87891 Personal history of nicotine dependence: Secondary | ICD-10-CM

## 2018-03-11 DIAGNOSIS — E039 Hypothyroidism, unspecified: Secondary | ICD-10-CM | POA: Diagnosis present

## 2018-03-11 DIAGNOSIS — Z800 Family history of malignant neoplasm of digestive organs: Secondary | ICD-10-CM

## 2018-03-11 DIAGNOSIS — Z833 Family history of diabetes mellitus: Secondary | ICD-10-CM

## 2018-03-11 DIAGNOSIS — Z853 Personal history of malignant neoplasm of breast: Secondary | ICD-10-CM

## 2018-03-11 DIAGNOSIS — G4733 Obstructive sleep apnea (adult) (pediatric): Secondary | ICD-10-CM | POA: Diagnosis present

## 2018-03-11 DIAGNOSIS — Z7982 Long term (current) use of aspirin: Secondary | ICD-10-CM

## 2018-03-11 DIAGNOSIS — E1142 Type 2 diabetes mellitus with diabetic polyneuropathy: Secondary | ICD-10-CM | POA: Diagnosis present

## 2018-03-11 DIAGNOSIS — N390 Urinary tract infection, site not specified: Secondary | ICD-10-CM | POA: Diagnosis present

## 2018-03-11 DIAGNOSIS — Z79899 Other long term (current) drug therapy: Secondary | ICD-10-CM

## 2018-03-11 DIAGNOSIS — Z79890 Hormone replacement therapy (postmenopausal): Secondary | ICD-10-CM

## 2018-03-11 DIAGNOSIS — E1169 Type 2 diabetes mellitus with other specified complication: Secondary | ICD-10-CM | POA: Diagnosis present

## 2018-03-11 DIAGNOSIS — J9601 Acute respiratory failure with hypoxia: Secondary | ICD-10-CM | POA: Diagnosis present

## 2018-03-11 DIAGNOSIS — J45909 Unspecified asthma, uncomplicated: Secondary | ICD-10-CM | POA: Diagnosis present

## 2018-03-11 DIAGNOSIS — R0602 Shortness of breath: Secondary | ICD-10-CM

## 2018-03-11 DIAGNOSIS — Z886 Allergy status to analgesic agent status: Secondary | ICD-10-CM

## 2018-03-11 LAB — URINALYSIS, ROUTINE W REFLEX MICROSCOPIC
BILIRUBIN URINE: NEGATIVE
Glucose, UA: NEGATIVE mg/dL
Ketones, ur: NEGATIVE mg/dL
Nitrite: NEGATIVE
Protein, ur: 30 mg/dL — AB
Specific Gravity, Urine: 1.018 (ref 1.005–1.030)
WBC, UA: >50 WBC/hpf — ABNORMAL HIGH (ref 0–5)
pH: 5 (ref 5.0–8.0)

## 2018-03-11 LAB — BRAIN NATRIURETIC PEPTIDE: B Natriuretic Peptide: 74.5 pg/mL (ref 0.0–100.0)

## 2018-03-11 LAB — BASIC METABOLIC PANEL
Anion gap: 11 (ref 5–15)
BUN: 16 mg/dL (ref 6–20)
CALCIUM: 9 mg/dL (ref 8.9–10.3)
CO2: 24 mmol/L (ref 22–32)
CREATININE: 1.13 mg/dL — AB (ref 0.44–1.00)
Chloride: 101 mmol/L (ref 98–111)
GFR calc Af Amer: >60 mL/min (ref >60)
GFR calc non Af Amer: 55 mL/min — ABNORMAL LOW (ref >60)
Glucose, Bld: 291 mg/dL — ABNORMAL HIGH (ref 70–99)
Potassium: 4.3 mmol/L (ref 3.5–5.1)
Sodium: 136 mmol/L (ref 135–145)

## 2018-03-11 LAB — TSH: TSH: 3.571 u[IU]/mL (ref 0.350–4.500)

## 2018-03-11 LAB — CBC
HCT: 43.7 % (ref 36.0–46.0)
Hemoglobin: 13.2 g/dL (ref 12.0–15.0)
MCH: 24.7 pg — ABNORMAL LOW (ref 26.0–34.0)
MCHC: 30.2 g/dL (ref 30.0–36.0)
MCV: 81.7 fL (ref 80.0–100.0)
Platelets: 193 10*3/uL (ref 150–400)
RBC: 5.35 MIL/uL — AB (ref 3.87–5.11)
RDW: 15.9 % — ABNORMAL HIGH (ref 11.5–15.5)
WBC: 7.9 10*3/uL (ref 4.0–10.5)
nRBC: 0 % (ref 0.0–0.2)

## 2018-03-11 LAB — I-STAT TROPONIN, ED: Troponin i, poc: 0 ng/mL (ref 0.00–0.08)

## 2018-03-11 LAB — I-STAT BETA HCG BLOOD, ED (MC, WL, AP ONLY): I-stat hCG, quantitative: <5 m[IU]/mL (ref <5)

## 2018-03-11 MED ORDER — FUROSEMIDE 10 MG/ML IJ SOLN
40.0000 mg | Freq: Once | INTRAMUSCULAR | Status: AC
  Administered 2018-03-11: 40 mg via INTRAVENOUS
  Filled 2018-03-11: qty 4

## 2018-03-11 MED ORDER — ALUM & MAG HYDROXIDE-SIMETH 200-200-20 MG/5ML PO SUSP
30.0000 mL | Freq: Once | ORAL | Status: AC
  Administered 2018-03-11: 30 mL via ORAL
  Filled 2018-03-11: qty 30

## 2018-03-11 MED ORDER — SODIUM CHLORIDE 0.9% FLUSH
3.0000 mL | Freq: Once | INTRAVENOUS | Status: AC
  Administered 2018-03-11: 3 mL via INTRAVENOUS

## 2018-03-11 NOTE — ED Provider Notes (Signed)
[facility] EMERGENCY DEPARTMENT Provider Note   CSN: [id number] Arrival date & time: 03/11/18  1226    History   Chief Complaint Chief Complaint  Patient presents with  . Shortness of Breath    HPI Cheyenne Guzman is a 53 y.o. female who presents with shortness of breath and fatigue. PMH significant of morbid obesity, chronic lower leg edema, frequent UTI, diabetes, hx of breast cancer. She states that on Friday she noticed she was very fatigued and hasn't had an appetite. Over the weekend the fatigue had progressed and she developed shortness of breath. She was getting short of breath with exertion and now she's having shortness of breath at rest and with talking. Last night she developed intermittent burning substernal chest pain that radiates to her throat. It feels somewhat like when you "breath cold air". It's worse with deep breaths. It is associated with palpitations. She also has some sweats, headache, and nausea. She denies fever, chills, sore throat, runny nose, cough, wheezing, abdominal pain. She has some constipation from being on narcotics chronically and thinks she has a UTI right now but that's not unusual. She also notes that she's has more lower extremity edema which is slightly worse. It typically gets better with elevation but now is not. She also tried to take Lasix which didn't help. She denies any cardiac hx. No hx of DVT/PE. She is worried she is having a heart attack. Echo in 2014 was normal. She's never had stress testing. She is former smoker but hasn't smoked in 25 years.    HPI  Past Medical History:  Diagnosis Date  . Anxiety    per pt. - mild   . Arthritis    back & ankles   . Asthma    seasonal   . Cancer [facility])    breast, sees Dr. Jana Hakim , Left - 11/2007  . Complication of anesthesia 2013   severe muscle spasms-sore-no doc  . Depression   . Diabetes mellitus    sees Dr. Chalmers Cater, diagnosed 2003  . HA (headache)   . Hearing loss      Went to [facility]  . Hyperlipidemia   . Kidney infection    - hosp. [facility]- septic- 10/2009  . Low back pain   . Peripheral neuropathy    R sided numbness- face & jaw  . Sleep apnea 9/15   severe-just started cpap-doing well  . TIA (transient ischemic attack)    in 2007,2008, and 2011  . Tinnitus of right ear   . Urosepsis 08-2009   with Klebsiella    Patient Active Problem List   Diagnosis Date Noted  . Cellulitis of right leg 09/05/2017  . Acute viral syndrome 09/05/2017  . Hypothyroidism 08/14/2017  . Edema of both legs 10/18/2015  . Migraine 06/16/2014  . Lumbar radiculopathy 03/18/2014  . Diabetic neuropathy ([place]) 01/07/2014  . Congenital spondylolysis of lumbosacral region 11/02/2013  . SPL (spondylolisthesis) 10/08/2013  . Chronic LBP 10/08/2013  . Obstructive sleep apnea 07/31/2013  . HA (headache)   . Tinnitus 06/23/2013  . Hearing loss 06/23/2013  . Headache(784.0) 06/23/2013  . Low back pain 06/10/2013  . SOB (shortness of breath) 11/27/2012  . Chest pain 11/27/2012  . Cellulitis of left leg 07/03/2012  . Morbid obesity ([place]) 07/03/2012  . Abdominal pain 02/01/2012  . Breast mass, right 07/13/2011  . DCIS (ductal carcinoma in situ) of breast, left, treated with [facility] 11/2007 02/19/2011  .  TIA 10/13/2009  . BACK PAIN, LUMBAR 10/13/2009  . PYELONEPHRITIS 09/09/2009  . Urinary tract infection, site not specified 08/16/2008  . Asthma 02/20/2008  . CANDIDIASIS 08/28/2007  . Diabetes mellitus without complication ([place]) 25/36/6440  . HYPERLIPIDEMIA 11/26/2006  . Anxiety state 11/26/2006  . DEPRESSION 11/26/2006  . ACUTE BRONCHITIS 11/26/2006    Past Surgical History:  Procedure Laterality Date  . ABDOMINAL HYSTERECTOMY  08/2004  . BREAST BIOPSY  06/22/2011   Procedure: BREAST BIOPSY;  Surgeon: Stark Klein, MD;  Location: [place];  Service: General;  Laterality: Right;  . BREAST SURGERY  11/2007   left ductal carcinoma  in situ  . CARPAL TUNNEL RELEASE Right 10/13/2013   Procedure: RIGHT CARPAL TUNNEL RELEASE;  Surgeon: Hessie Dibble, MD;  Location: [place];  Service: Orthopedics;  Laterality: Right;  . CARPAL TUNNEL RELEASE Left 11/24/2013   Procedure: LEFT CARPAL TUNNEL RELEASE WITH CYST EXCISION ;  Surgeon: Hessie Dibble, MD;  Location: [place];  Service: Orthopedics;  Laterality: Left;  . EAR CYST EXCISION Left 11/24/2013   Procedure: CYST REMOVAL;  Surgeon: Hessie Dibble, MD;  Location: [place];  Service: Orthopedics;  Laterality: Left;  . FOOT SURGERY  2000   plantar fasciitis-left  . LUMBAR DISC SURGERY  03-29-14   Fusion, revision 04-27-14 L4-5 Cauda Equina with graft  . NERVE GRAFT  06-01-08   cadaver graft to right inferior alveolar nerve  in [place]     OB History   No obstetric history on file.      Home Medications    Prior to Admission medications   Medication Sig Start Date End Date Taking? Authorizing Provider  albuterol (PROVENTIL HFA;VENTOLIN HFA) 108 (90 Base) MCG/ACT inhaler Inhale 2 puffs into the lungs every 4 (four) hours as needed for wheezing or shortness of breath. 04/03/17   Laurey Morale, MD  albuterol (PROVENTIL) (2.5 MG/3ML) 0.083% nebulizer solution 1 VIAL IN NEBULIZER EVERY 4 HOURS AS NEEDED FOR WHEEZING 10/13/13   Laurey Morale, MD  ALPRAZolam Duanne Moron) 1 MG tablet TAKE 1 TABLET BY MOUTH THREE TIMES A DAY 03/06/18   Laurey Morale, MD  aspirin 325 MG EC tablet Take 325 mg by mouth at bedtime. Reported on 04/19/2015    [provider]  Blood Glucose Monitoring Suppl (CONTOUR NEXT MONITOR) w/Device KIT 1 each by Does not apply route 2 (two) times daily. To check blood sugars twice a day. 08/27/16   Renato Shin, MD  Buprenorphine 15 MCG/HR PTWK Place 1 patch onto the skin once a week.  07/23/17   [provider]  ciprofloxacin (CIPRO) 500 MG tablet Take 1 tablet (500 mg total) by mouth 2 (two) times  daily. 11/01/17   Laurey Morale, MD  folic acid (FOLVITE) 1 MG tablet Take 1 mg by mouth daily. 09/03/17   [provider]  furosemide (LASIX) 40 MG tablet TAKE 1 TABLET (40 MG TOTAL) BY MOUTH DAILY. AS NEEDED FOR SWELLING. Patient taking differently: Take 40 mg by mouth daily.  02/28/16   Laurey Morale, MD  gabapentin (NEURONTIN) 300 MG capsule Take 300 mg by mouth 3 (three) times daily.     [provider]  glucose blood (CONTOUR NEXT TEST) test strip Use to test blood sugar 2 times daily 12/18/16   Renato Shin, MD  glucose blood test strip Use as instructed 08/24/16   Renato Shin, MD  Insulin Glargine (LANTUS SOLOSTAR) 100 UNIT/ML Solostar  Pen Inject 280 Units into the skin every morning. and pen needles 3/day 10/16/17   Renato Shin, MD  levothyroxine (SYNTHROID, LEVOTHROID) 75 MCG tablet Take 1 tablet (75 mcg total) by mouth daily. 05/28/17   Laurey Morale, MD  methocarbamol (ROBAXIN) 750 MG tablet Take 1 tablet (750 mg total) by mouth 2 (two) times daily. Patient taking differently: Take 750 mg by mouth 3 (three) times daily.  02/03/15   Laurey Morale, MD  Methotrexate Sodium (METHOTREXATE, PF,) 50 MG/2ML injection Inject 15 mg into the vein once a week. 0.6 cc 09/03/17   [provider]  Oxycodone HCl 10 MG TABS Take 10 mg by mouth daily as needed (breakthrough pain).     [provider]  sertraline (ZOLOFT) 100 MG tablet TAKE 2 TABLETS (200 MG TOTAL) BY MOUTH AT BEDTIME. Patient taking differently: Take 200 mg by mouth daily.  05/15/17   Laurey Morale, MD    Family History Family History  Problem Relation Age of Onset  . Cancer Mother        breast  . Cancer Father        lung  . Diabetes Father   . Cancer Sister        colon  . Cancer Maternal Aunt        breast - both  . Diabetes Sister   . Anesthesia problems Neg Hx     Social History Social History   Tobacco Use  . Smoking status: Former Smoker    Last attempt to quit: 02/18/1997     Years since quitting: 21.0  . Smokeless tobacco: Never Used  Substance Use Topics  . Alcohol use: Yes    Comment: rare  . Drug use: No     Allergies   Vicodin [hydrocodone-acetaminophen]   Review of Systems Review of Systems  Constitutional: Positive for diaphoresis and fatigue. Negative for chills and fever.  Respiratory: Positive for shortness of breath. Negative for cough and wheezing.   Cardiovascular: Positive for chest pain, palpitations and leg swelling.  Gastrointestinal: Positive for constipation and nausea. Negative for abdominal pain, diarrhea and vomiting.  Genitourinary: Positive for dysuria.  Musculoskeletal: Negative for back pain and myalgias.  Neurological: Positive for headaches. Negative for syncope.  All other systems reviewed and are negative.    Physical Exam Updated Vital Signs BP 122/76   Pulse 82   Temp 98.7 F (37.1 C) (Oral)   Resp 16   SpO2 96%   Physical Exam Vitals signs and nursing note reviewed.  Constitutional:      General: She is not in acute distress.    Appearance: She is well-developed. She is obese.     Comments: Calm, cooperative. Mildly short of breath while talking  HENT:     Head: Normocephalic and atraumatic.     Right Ear: Tympanic membrane normal.     Left Ear: Tympanic membrane normal.     Nose: Nose normal.     Mouth/Throat:     Lips: Pink.     Mouth: Mucous membranes are moist.     Pharynx: Oropharynx is clear.  Eyes:     General: No scleral icterus.       Right eye: No discharge.        Left eye: No discharge.     Conjunctiva/sclera: Conjunctivae normal.     Pupils: Pupils are equal, round, and reactive to light.  Neck:     Musculoskeletal: Normal range of motion and neck supple.  Cardiovascular:     Rate and Rhythm: Normal rate and regular rhythm.  Pulmonary:     Effort: Pulmonary effort is normal. No respiratory distress.     Breath sounds: Normal breath sounds.  Chest:     Chest wall: No tenderness.    Abdominal:     General: There is no distension.     Palpations: Abdomen is soft.     Tenderness: There is no abdominal tenderness.  Musculoskeletal:     Right lower leg: Edema present.     Left lower leg: Edema present.     Comments: 2+ pitting edema and skin changes from chronic edema  Skin:    General: Skin is warm and dry.  Neurological:     Mental Status: She is alert and oriented to person, place, and time.  Psychiatric:        Mood and Affect: Mood normal.        Behavior: Behavior normal.      ED Treatments / Results  Labs (all labs ordered are listed, but only abnormal results are displayed) Labs Reviewed  BASIC METABOLIC PANEL - Abnormal; Notable for the following components:      Result Value   Glucose, Bld 291 (*)    Creatinine, Ser 1.13 (*)    GFR calc non Af Amer 55 (*)    All other components within normal limits  CBC - Abnormal; Notable for the following components:   RBC 5.35 (*)    MCH 24.7 (*)    RDW 15.9 (*)    All other components within normal limits  URINALYSIS, ROUTINE W REFLEX MICROSCOPIC - Abnormal; Notable for the following components:   APPearance CLOUDY (*)    Hgb urine dipstick MODERATE (*)    Protein, ur 30 (*)    Leukocytes,Ua LARGE (*)    WBC, UA >50 (*)    Bacteria, UA MANY (*)    All other components within normal limits  URINE CULTURE  BRAIN NATRIURETIC PEPTIDE  TSH  D-DIMER, QUANTITATIVE (NOT AT [facility])  I-STAT TROPONIN, ED  I-STAT BETA HCG BLOOD, ED ([facility], [facility], [facility] ONLY)  I-STAT TROPONIN, ED    EKG EKG Interpretation  Date/Time:  Tuesday March 11 2018 12:34:46 EST Ventricular Rate:  79 PR Interval:  152 QRS Duration: 86 QT Interval:  388 QTC Calculation: 444 R Axis:   -23 Text Interpretation:  Normal sinus rhythm Low voltage QRS Borderline ECG Rate improved compared to prior Confirmed by Pryor Curia [phone]) on 03/12/2018 12:08:57 AM   Radiology Dg Chest 2 View  Result Date: 03/11/2018 CLINICAL DATA:  Shortness of  breath and fatigue. EXAM: CHEST - 2 VIEW COMPARISON:  09/05/2017 FINDINGS: The heart is mildly enlarged but stable. Slightly prominent mediastinal contours are also stable. The pulmonary hila appear normal. Mild central vascular congestion but no overt pulmonary edema. There is peribronchial thickening which could suggest bronchitis or reactive airways disease. No pleural effusions or focal infiltrates. The bony thorax is intact. IMPRESSION: Mild stable cardiac enlargement with mild central vascular congestion. No infiltrates, edema or effusions. Peribronchial thickening may suggest bronchitis. Electronically Signed   By: Marijo Sanes M.D.   On: 03/11/2018 13:33    Procedures Procedures (including critical care time)  Medications Ordered in ED Medications  sodium chloride flush (NS) 0.9 % injection 3 mL (3 mLs Intravenous Given 03/11/18 2016)  alum & mag hydroxide-simeth (MAALOX/MYLANTA) 200-200-20 MG/5ML suspension 30 mL (30 mLs Oral Given 03/11/18 2017)  furosemide (LASIX) injection 40 mg (40  mg Intravenous Given 03/11/18 2016)     Initial Impression / Assessment and Plan / ED Course  I have reviewed the triage vital signs and the nursing notes.  Pertinent labs & imaging results that were available during my care of the patient were reviewed by me and considered in my medical decision making (see chart for details).  54 year old female presents with worsening shortness of breath and fatigue for the past three days with associated intermittent chest pain and edema. She is hypertensive but otherwise vitals are normal. Heart is regular rate and rhythm. Lungs are CTA. Abdomen is soft and non-tender. She has lower leg edema. Unsure if it is worse than normal since it's a chronic problem. EKG is SR. CBC is normal. BMP is remarkable for hyperglycemia and mildly elevated SCr. Initial trop is 0. UA is consistent with infection. Will send culture. CXR shows interstitial edema. She's had a normal echo about  6 years ago and doesn't have cardiac hx. Will add BNP and trial IV Lasix to see if this helps. Will also add TSH and try Maalox for her chest pain which sounds somewhat atypical. Will give Rocephin for UTI  Nurse tech attempted to ambulate pt. She became very short of breath just sitting up on the side of the bed and HR was in the 130s. She did not become hypoxic. She was not actually ambulated because she was so symptomatic. After Lasix she hasn't had any improvement in her breathing.  Discussed with Dr. Leonides Schanz. Will add on D-dimer and 2nd trop. Most likely will need admission since she is so symptomatic. Care transferred to Dr. Leonides Schanz at shift change.  Final Clinical Impressions(s) / ED Diagnoses   Final diagnoses:  Shortness of breath    ED Discharge Orders    None       Recardo Evangelist, PA-C 03/12/18 0015    Ward, Delice Bison, DO 03/12/18 0214    Ward, Delice Bison, DO 03/12/18 [id number]

## 2018-03-11 NOTE — ED Notes (Signed)
UA order obtained, culture sent with sample to lab as well.

## 2018-03-11 NOTE — ED Triage Notes (Signed)
Pt states she has been fatigued since the weekend. She reports she now has shortness of breath and is unable to walk any distance without tachycardia and severe SOB.

## 2018-03-11 NOTE — ED Notes (Signed)
Emiyah Spraggins- daughter - #[phone]

## 2018-03-11 NOTE — ED Notes (Signed)
Pt provided urine sample, urine cloudy and malodorous. Will notify PA or MD and request order for urinalysis.

## 2018-03-11 NOTE — ED Notes (Signed)
PT changed and chucks applied to bed prior to ambulation attempt. PT was visibly short of breath upon undressing and ambulating to edge of the bed, Sinus Tach at 139. PT helped back into bed. Lowest Sat during Ambulation 93%. PA notified. Will continue to monitor.

## 2018-03-12 ENCOUNTER — Telehealth: Payer: Self-pay | Admitting: *Deleted

## 2018-03-12 ENCOUNTER — Observation Stay (HOSPITAL_BASED_OUTPATIENT_CLINIC_OR_DEPARTMENT_OTHER): Payer: BLUE CROSS/BLUE SHIELD

## 2018-03-12 ENCOUNTER — Other Ambulatory Visit: Payer: Self-pay

## 2018-03-12 ENCOUNTER — Encounter (HOSPITAL_COMMUNITY): Payer: Self-pay | Admitting: Internal Medicine

## 2018-03-12 ENCOUNTER — Observation Stay (HOSPITAL_COMMUNITY): Payer: BLUE CROSS/BLUE SHIELD

## 2018-03-12 ENCOUNTER — Emergency Department (HOSPITAL_COMMUNITY): Payer: BLUE CROSS/BLUE SHIELD

## 2018-03-12 DIAGNOSIS — I2699 Other pulmonary embolism without acute cor pulmonale: Principal | ICD-10-CM

## 2018-03-12 DIAGNOSIS — Z87891 Personal history of nicotine dependence: Secondary | ICD-10-CM | POA: Diagnosis not present

## 2018-03-12 DIAGNOSIS — Z8673 Personal history of transient ischemic attack (TIA), and cerebral infarction without residual deficits: Secondary | ICD-10-CM | POA: Diagnosis not present

## 2018-03-12 DIAGNOSIS — Z79891 Long term (current) use of opiate analgesic: Secondary | ICD-10-CM | POA: Diagnosis not present

## 2018-03-12 DIAGNOSIS — B9620 Unspecified Escherichia coli [E. coli] as the cause of diseases classified elsewhere: Secondary | ICD-10-CM | POA: Diagnosis present

## 2018-03-12 DIAGNOSIS — E039 Hypothyroidism, unspecified: Secondary | ICD-10-CM

## 2018-03-12 DIAGNOSIS — Z7982 Long term (current) use of aspirin: Secondary | ICD-10-CM | POA: Diagnosis not present

## 2018-03-12 DIAGNOSIS — J45909 Unspecified asthma, uncomplicated: Secondary | ICD-10-CM | POA: Diagnosis present

## 2018-03-12 DIAGNOSIS — G4733 Obstructive sleep apnea (adult) (pediatric): Secondary | ICD-10-CM

## 2018-03-12 DIAGNOSIS — E785 Hyperlipidemia, unspecified: Secondary | ICD-10-CM | POA: Diagnosis present

## 2018-03-12 DIAGNOSIS — Z794 Long term (current) use of insulin: Secondary | ICD-10-CM | POA: Diagnosis not present

## 2018-03-12 DIAGNOSIS — E1169 Type 2 diabetes mellitus with other specified complication: Secondary | ICD-10-CM | POA: Diagnosis present

## 2018-03-12 DIAGNOSIS — Z853 Personal history of malignant neoplasm of breast: Secondary | ICD-10-CM | POA: Diagnosis not present

## 2018-03-12 DIAGNOSIS — Z79899 Other long term (current) drug therapy: Secondary | ICD-10-CM | POA: Diagnosis not present

## 2018-03-12 DIAGNOSIS — E669 Obesity, unspecified: Secondary | ICD-10-CM

## 2018-03-12 DIAGNOSIS — N390 Urinary tract infection, site not specified: Secondary | ICD-10-CM | POA: Diagnosis present

## 2018-03-12 DIAGNOSIS — Z6841 Body Mass Index (BMI) 40.0 and over, adult: Secondary | ICD-10-CM | POA: Diagnosis not present

## 2018-03-12 DIAGNOSIS — J9601 Acute respiratory failure with hypoxia: Secondary | ICD-10-CM | POA: Diagnosis present

## 2018-03-12 DIAGNOSIS — E1142 Type 2 diabetes mellitus with diabetic polyneuropathy: Secondary | ICD-10-CM | POA: Diagnosis present

## 2018-03-12 DIAGNOSIS — Z801 Family history of malignant neoplasm of trachea, bronchus and lung: Secondary | ICD-10-CM | POA: Diagnosis not present

## 2018-03-12 DIAGNOSIS — G894 Chronic pain syndrome: Secondary | ICD-10-CM | POA: Diagnosis present

## 2018-03-12 DIAGNOSIS — Z79890 Hormone replacement therapy (postmenopausal): Secondary | ICD-10-CM | POA: Diagnosis not present

## 2018-03-12 DIAGNOSIS — H9193 Unspecified hearing loss, bilateral: Secondary | ICD-10-CM | POA: Diagnosis present

## 2018-03-12 DIAGNOSIS — Z833 Family history of diabetes mellitus: Secondary | ICD-10-CM | POA: Diagnosis not present

## 2018-03-12 LAB — TROPONIN I
Troponin I: <0.03 ng/mL (ref <0.03)
Troponin I: <0.03 ng/mL (ref <0.03)
Troponin I: <0.03 ng/mL (ref <0.03)

## 2018-03-12 LAB — GLUCOSE, CAPILLARY
GLUCOSE-CAPILLARY: 249 mg/dL — AB (ref 70–99)
Glucose-Capillary: 257 mg/dL — ABNORMAL HIGH (ref 70–99)
Glucose-Capillary: 250 mg/dL — ABNORMAL HIGH (ref 70–99)

## 2018-03-12 LAB — ECHOCARDIOGRAM COMPLETE
Height: 67 in
Weight: 6657.6 oz

## 2018-03-12 LAB — I-STAT TROPONIN, ED: TROPONIN I, POC: 0.01 ng/mL (ref 0.00–0.08)

## 2018-03-12 LAB — TSH: TSH: 4.332 u[IU]/mL (ref 0.350–4.500)

## 2018-03-12 LAB — HEPARIN LEVEL (UNFRACTIONATED): Heparin Unfractionated: 0.11 IU/mL — ABNORMAL LOW (ref 0.30–0.70)

## 2018-03-12 LAB — D-DIMER, QUANTITATIVE: D-Dimer, Quant: 0.54 ug/mL-FEU — ABNORMAL HIGH (ref 0.00–0.50)

## 2018-03-12 LAB — MAGNESIUM: Magnesium: 2 mg/dL (ref 1.7–2.4)

## 2018-03-12 MED ORDER — HEPARIN BOLUS VIA INFUSION
6000.0000 [IU] | Freq: Once | INTRAVENOUS | Status: AC
  Administered 2018-03-12: 6000 [IU] via INTRAVENOUS
  Filled 2018-03-12: qty 6000

## 2018-03-12 MED ORDER — IOPAMIDOL (ISOVUE-370) INJECTION 76%
INTRAVENOUS | Status: AC
  Filled 2018-03-12: qty 100

## 2018-03-12 MED ORDER — WARFARIN - PHARMACIST DOSING INPATIENT
Freq: Every day | Status: DC
  Administered 2018-03-12: 18:00:00

## 2018-03-12 MED ORDER — INSULIN GLARGINE 100 UNIT/ML ~~LOC~~ SOLN
280.0000 [IU] | SUBCUTANEOUS | Status: DC
  Administered 2018-03-12 – 2018-03-13 (×2): 280 [IU] via SUBCUTANEOUS
  Filled 2018-03-12 (×2): qty 2.8

## 2018-03-12 MED ORDER — SERTRALINE HCL 100 MG PO TABS
200.0000 mg | ORAL_TABLET | Freq: Every day | ORAL | Status: DC
  Administered 2018-03-12 – 2018-03-13 (×2): 200 mg via ORAL
  Filled 2018-03-12 (×2): qty 2

## 2018-03-12 MED ORDER — ENOXAPARIN SODIUM 300 MG/3ML IJ SOLN
190.0000 mg | Freq: Once | INTRAMUSCULAR | Status: AC
  Administered 2018-03-12: 190 mg via SUBCUTANEOUS
  Filled 2018-03-12: qty 1.9

## 2018-03-12 MED ORDER — ASPIRIN EC 325 MG PO TBEC
325.0000 mg | DELAYED_RELEASE_TABLET | Freq: Every day | ORAL | Status: DC
  Administered 2018-03-12: 325 mg via ORAL
  Filled 2018-03-12: qty 1

## 2018-03-12 MED ORDER — ONDANSETRON HCL 4 MG PO TABS: 4.0000 mg | ORAL_TABLET | Freq: Four times a day (QID) | ORAL | Status: DC | PRN

## 2018-03-12 MED ORDER — WARFARIN SODIUM 7.5 MG PO TABS: 15.0000 mg | ORAL_TABLET | Freq: Once | ORAL | Status: DC

## 2018-03-12 MED ORDER — BUPRENORPHINE 15 MCG/HR TD PTWK: 1.0000 | MEDICATED_PATCH | TRANSDERMAL | Status: DC

## 2018-03-12 MED ORDER — INSULIN ASPART 100 UNIT/ML ~~LOC~~ SOLN
0.0000 [IU] | Freq: Three times a day (TID) | SUBCUTANEOUS | Status: DC
  Administered 2018-03-12: 7 [IU] via SUBCUTANEOUS
  Administered 2018-03-13: 4 [IU] via SUBCUTANEOUS

## 2018-03-12 MED ORDER — ONDANSETRON HCL 4 MG/2ML IJ SOLN: 4.0000 mg | Freq: Four times a day (QID) | INTRAMUSCULAR | Status: DC | PRN

## 2018-03-12 MED ORDER — INSULIN ASPART 100 UNIT/ML ~~LOC~~ SOLN
0.0000 [IU] | Freq: Three times a day (TID) | SUBCUTANEOUS | Status: DC
  Administered 2018-03-12: 8 [IU] via SUBCUTANEOUS

## 2018-03-12 MED ORDER — WARFARIN VIDEO
Freq: Once | Status: AC
  Administered 2018-03-12: 12:00:00

## 2018-03-12 MED ORDER — ENOXAPARIN SODIUM 300 MG/3ML IJ SOLN
190.0000 mg | Freq: Two times a day (BID) | INTRAMUSCULAR | Status: DC
  Filled 2018-03-12: qty 1.9

## 2018-03-12 MED ORDER — WARFARIN SODIUM 7.5 MG PO TABS
7.5000 mg | ORAL_TABLET | ORAL | Status: AC
  Administered 2018-03-12: 7.5 mg via ORAL
  Filled 2018-03-12: qty 1

## 2018-03-12 MED ORDER — ALPRAZOLAM 0.5 MG PO TABS: 1.0000 mg | ORAL_TABLET | Freq: Three times a day (TID) | ORAL | Status: DC | PRN

## 2018-03-12 MED ORDER — ALBUTEROL SULFATE (2.5 MG/3ML) 0.083% IN NEBU: 2.5000 mg | INHALATION_SOLUTION | RESPIRATORY_TRACT | Status: DC | PRN

## 2018-03-12 MED ORDER — HEPARIN (PORCINE) 25000 UT/250ML-% IV SOLN
2600.0000 [IU]/h | INTRAVENOUS | Status: DC
  Administered 2018-03-12: 2000 [IU]/h via INTRAVENOUS
  Filled 2018-03-12 (×2): qty 250

## 2018-03-12 MED ORDER — SODIUM CHLORIDE 0.9 % IV SOLN
1.0000 g | Freq: Once | INTRAVENOUS | Status: AC
  Administered 2018-03-12: 1 g via INTRAVENOUS
  Filled 2018-03-12: qty 10

## 2018-03-12 MED ORDER — COUMADIN BOOK
Freq: Once | Status: AC
  Administered 2018-03-12: 12:00:00
  Filled 2018-03-12: qty 1

## 2018-03-12 MED ORDER — HEPARIN BOLUS VIA INFUSION
3000.0000 [IU] | Freq: Once | INTRAVENOUS | Status: AC
  Administered 2018-03-12: 3000 [IU] via INTRAVENOUS
  Filled 2018-03-12: qty 3000

## 2018-03-12 MED ORDER — PERFLUTREN LIPID MICROSPHERE
1.0000 mL | INTRAVENOUS | Status: AC | PRN
  Administered 2018-03-12: 4 mL via INTRAVENOUS
  Filled 2018-03-12: qty 10

## 2018-03-12 MED ORDER — WARFARIN SODIUM 5 MG PO TABS
5.0000 mg | ORAL_TABLET | Freq: Once | ORAL | Status: AC
  Administered 2018-03-12: 5 mg via ORAL
  Filled 2018-03-12: qty 1

## 2018-03-12 MED ORDER — LEVOTHYROXINE SODIUM 75 MCG PO TABS
75.0000 ug | ORAL_TABLET | Freq: Every day | ORAL | Status: DC
  Administered 2018-03-12 – 2018-03-13 (×2): 75 ug via ORAL
  Filled 2018-03-12 (×2): qty 1

## 2018-03-12 MED ORDER — METHOCARBAMOL 750 MG PO TABS
750.0000 mg | ORAL_TABLET | Freq: Three times a day (TID) | ORAL | Status: DC
  Administered 2018-03-12 – 2018-03-13 (×4): 750 mg via ORAL
  Filled 2018-03-12 (×4): qty 1

## 2018-03-12 MED ORDER — SODIUM CHLORIDE 0.9 % IV SOLN
1.0000 g | INTRAVENOUS | Status: DC
  Administered 2018-03-12: 1 g via INTRAVENOUS
  Filled 2018-03-12: qty 10

## 2018-03-12 MED ORDER — GABAPENTIN 300 MG PO CAPS
300.0000 mg | ORAL_CAPSULE | Freq: Three times a day (TID) | ORAL | Status: DC
  Administered 2018-03-12 – 2018-03-13 (×4): 300 mg via ORAL
  Filled 2018-03-12 (×4): qty 1

## 2018-03-12 MED ORDER — OXYCODONE HCL 5 MG PO TABS: 10.0000 mg | ORAL_TABLET | Freq: Every day | ORAL | Status: DC | PRN

## 2018-03-12 MED ORDER — ENOXAPARIN (LOVENOX) PATIENT EDUCATION KIT
PACK | Freq: Once | Status: AC
  Administered 2018-03-12: 20:00:00
  Filled 2018-03-12: qty 1

## 2018-03-12 MED ORDER — IOPAMIDOL (ISOVUE-370) INJECTION 76%
100.0000 mL | Freq: Once | INTRAVENOUS | Status: AC | PRN
  Administered 2018-03-12: 100 mL via INTRAVENOUS

## 2018-03-12 MED ORDER — ENOXAPARIN SODIUM 300 MG/3ML IJ SOLN
190.0000 mg | Freq: Once | INTRAMUSCULAR | Status: AC
  Administered 2018-03-13: 190 mg via SUBCUTANEOUS
  Filled 2018-03-12: qty 1.9

## 2018-03-12 NOTE — Care Management (Signed)
#   6.   S/W MARVA  @ [employer] # [phone]  LOVENOX   190 MG BID  SYRINGES : NONE FORMULARY  1. LOVENOX  150 MG BID\COVER- NOT COVER PRIOR APPROVAL- YES [phone] OPT- 3, OPT-2   2. ENOXAPARIN 150 MG BID SYRINGES COVER- YES CO-PAY- $ 10.00 TIER- 2 DRUG PRIOR APPROVAL- NO 90 DAY SUPPLY FOR RETAIL OR M/O  $ 30.00  PREFERRED PHARMACY : YES [facility]

## 2018-03-12 NOTE — Progress Notes (Signed)
LE venous duplex       has been completed. Preliminary results can be found under CV proc through chart review. Sandar Krinke, BS, RDMS, RVT   

## 2018-03-12 NOTE — H&P (Signed)
History and Physical    SEBRINA KESSNER FBX:[id number] DOB: Sep 10, 1965 DOA: 03/11/2018  PCP: Laurey Morale, MD  Patient coming from: Home.  Chief Complaint: Shortness of breath.  HPI: Cheyenne Guzman is a 53 y.o. female with history of diabetes mellitus type 2, hypothyroidism, morbid obesity, breast cancer in remission presents to the ER with increasing shortness of breath over the last 5 days.  Patient was getting short of breath on minimal exertion.  Chest tightness.  At times having diaphoretic spells.  Also has been having some dysuria last few days.  Patient also noted increasing swelling of the both lower extremities.  ED Course: In the ER patient was initially given Lasix.  EKG was showing normal sinus rhythm.  D-dimer was minimally elevated.  CT angiogram of the chest shows bilateral pulmonary embolism without strain pattern.  Patient was started on heparin and admitted for further management.  Review of Systems: As per HPI, rest all negative.   Past Medical History:  Diagnosis Date  . Anxiety    per pt. - mild   . Arthritis    back & ankles   . Asthma    seasonal   . Cancer [facility])    breast, sees Dr. Jana Hakim , Left - 11/2007  . Complication of anesthesia 2013   severe muscle spasms-sore-no doc  . Depression   . Diabetes mellitus    sees Dr. Chalmers Cater, diagnosed 2003  . HA (headache)   . Hearing loss    Went to [facility] Throat,[facility]  . Hyperlipidemia   . Kidney infection    - hosp. [facility]- septic- 10/2009  . Low back pain   . Peripheral neuropathy    R sided numbness- face & jaw  . Sleep apnea 9/15   severe-just started cpap-doing well  . TIA (transient ischemic attack)    in 2007,2008, and 2011  . Tinnitus of right ear   . Urosepsis 08-2009   with Klebsiella    Past Surgical History:  Procedure Laterality Date  . ABDOMINAL HYSTERECTOMY  08/2004  . BREAST BIOPSY  06/22/2011   Procedure: BREAST BIOPSY;  Surgeon: Stark Klein, MD;   Location: [place];  Service: General;  Laterality: Right;  . BREAST SURGERY  11/2007   left ductal carcinoma in situ  . CARPAL TUNNEL RELEASE Right 10/13/2013   Procedure: RIGHT CARPAL TUNNEL RELEASE;  Surgeon: Hessie Dibble, MD;  Location: [place];  Service: Orthopedics;  Laterality: Right;  . CARPAL TUNNEL RELEASE Left 11/24/2013   Procedure: LEFT CARPAL TUNNEL RELEASE WITH CYST EXCISION ;  Surgeon: Hessie Dibble, MD;  Location: [place];  Service: Orthopedics;  Laterality: Left;  . EAR CYST EXCISION Left 11/24/2013   Procedure: CYST REMOVAL;  Surgeon: Hessie Dibble, MD;  Location: [place];  Service: Orthopedics;  Laterality: Left;  . FOOT SURGERY  2000   plantar fasciitis-left  . LUMBAR DISC SURGERY  03-29-14   Fusion, revision 04-27-14 L4-5 Cauda Equina with graft  . NERVE GRAFT  06-01-08   cadaver graft to right inferior alveolar nerve  in [place]     reports that she quit smoking about 21 years ago. She has never used smokeless tobacco. She reports current alcohol use. She reports that she does not use drugs.  Allergies  Allergen Reactions  . Vicodin [Hydrocodone-Acetaminophen] Nausea And Vomiting    Family History  Problem Relation Age of Onset  . Cancer  Mother        breast  . Cancer Father        lung  . Diabetes Father   . Cancer Sister        colon  . Cancer Maternal Aunt        breast - both  . Diabetes Sister   . Anesthesia problems Neg Hx     Prior to Admission medications   Medication Sig Start Date End Date Taking? Authorizing Provider  albuterol (PROVENTIL HFA;VENTOLIN HFA) 108 (90 Base) MCG/ACT inhaler Inhale 2 puffs into the lungs every 4 (four) hours as needed for wheezing or shortness of breath. 04/03/17  Yes Laurey Morale, MD  albuterol (PROVENTIL) (2.5 MG/3ML) 0.083% nebulizer solution 1 VIAL IN NEBULIZER EVERY 4 HOURS AS NEEDED FOR WHEEZING Patient taking differently: Take 2.5 mg by  nebulization every 4 (four) hours as needed for wheezing.  10/13/13  Yes Laurey Morale, MD  ALPRAZolam Duanne Moron) 1 MG tablet TAKE 1 TABLET BY MOUTH THREE TIMES A DAY Patient taking differently: Take 1 mg by mouth 3 (three) times daily.  03/06/18  Yes Laurey Morale, MD  aspirin 325 MG EC tablet Take 325 mg by mouth at bedtime. Reported on 04/19/2015   Yes [provider]  Buprenorphine 15 MCG/HR PTWK Place 1 patch onto the skin every Tuesday.  07/23/17  Yes [provider]  furosemide (LASIX) 40 MG tablet TAKE 1 TABLET (40 MG TOTAL) BY MOUTH DAILY. AS NEEDED FOR SWELLING. Patient taking differently: Take 40 mg by mouth daily.  02/28/16  Yes Laurey Morale, MD  gabapentin (NEURONTIN) 300 MG capsule Take 300 mg by mouth 3 (three) times daily.    Yes [provider]  Insulin Glargine (LANTUS SOLOSTAR) 100 UNIT/ML Solostar Pen Inject 280 Units into the skin every morning. and pen needles 3/day 10/16/17  Yes Renato Shin, MD  levothyroxine (SYNTHROID, LEVOTHROID) 75 MCG tablet Take 1 tablet (75 mcg total) by mouth daily. 05/28/17  Yes Laurey Morale, MD  methocarbamol (ROBAXIN) 750 MG tablet Take 1 tablet (750 mg total) by mouth 2 (two) times daily. Patient taking differently: Take 750 mg by mouth 3 (three) times daily.  02/03/15  Yes Laurey Morale, MD  Oxycodone HCl 10 MG TABS Take 10 mg by mouth daily as needed (breakthrough pain).    Yes [provider]  sertraline (ZOLOFT) 100 MG tablet TAKE 2 TABLETS (200 MG TOTAL) BY MOUTH AT BEDTIME. Patient taking differently: Take 200 mg by mouth daily.  05/15/17  Yes Laurey Morale, MD  Blood Glucose Monitoring Suppl (CONTOUR NEXT MONITOR) w/Device KIT 1 each by Does not apply route 2 (two) times daily. To check blood sugars twice a day. 08/27/16   Renato Shin, MD  ciprofloxacin (CIPRO) 500 MG tablet Take 1 tablet (500 mg total) by mouth 2 (two) times daily. Patient not taking: Reported on 03/11/2018 11/01/17   Laurey Morale, MD    glucose blood (CONTOUR NEXT TEST) test strip Use to test blood sugar 2 times daily 12/18/16   Renato Shin, MD  glucose blood test strip Use as instructed 08/24/16   Renato Shin, MD    Physical Exam: Vitals:   03/12/18 0200 03/12/18 0230 03/12/18 0330 03/12/18 0454  BP:  114/79 116/74 (!) 165/83  Pulse:  96  91  Resp:  '20 15 20  ' Temp:    99.6 F (37.6 C)  TempSrc:    Oral  SpO2:  92%  97%  Weight: (!) 190.5 kg   (!) 188.7 kg  Height: '5\' 7"'  (1.702 m)         Constitutional: Moderately built and nourished. Vitals:   03/12/18 0200 03/12/18 0230 03/12/18 0330 03/12/18 0454  BP:  114/79 116/74 (!) 165/83  Pulse:  96  91  Resp:  '20 15 20  ' Temp:    99.6 F (37.6 C)  TempSrc:    Oral  SpO2:  92%  97%  Weight: (!) 190.5 kg   (!) 188.7 kg  Height: '5\' 7"'  (1.702 m)      Eyes: Anicteric no pallor. ENMT: No discharge from the ears eyes nose and mouth. Neck: No mass felt.  No neck rigidity.  No JVD appreciated. Respiratory: No rhonchi or crepitations. Cardiovascular: S1-S2 heard no murmurs appreciated. Abdomen: Soft nontender bowel sounds present. Musculoskeletal: Mild edema of the both lower extremities. Skin: Mild erythema of the both lower extremities. Neurologic: Alert awake oriented to time place and person.  Moves all extremities. Psychiatric: Appears normal.  Normal affect.   Labs on Admission: I have personally reviewed following labs and imaging studies  CBC: Recent Labs  Lab 03/11/18 1239  WBC 7.9  HGB 13.2  HCT 43.7  MCV 81.7  PLT 563   Basic Metabolic Panel: Recent Labs  Lab 03/11/18 1239  NA 136  K 4.3  CL 101  CO2 24  GLUCOSE 291*  BUN 16  CREATININE 1.13*  CALCIUM 9.0   GFR: Estimated Creatinine Clearance: 102.2 mL/min (A) (by C-G formula based on SCr of 1.13 mg/dL (H)). Liver Function Tests: No results for input(s): AST, ALT, ALKPHOS, BILITOT, PROT, ALBUMIN in the last 168 hours. No results for input(s): LIPASE, AMYLASE in the last 168  hours. No results for input(s): AMMONIA in the last 168 hours. Coagulation Profile: No results for input(s): INR, PROTIME in the last 168 hours. Cardiac Enzymes: No results for input(s): CKTOTAL, CKMB, CKMBINDEX, TROPONINI in the last 168 hours. BNP (last 3 results) No results for input(s): PROBNP in the last 8760 hours. HbA1C: No results for input(s): HGBA1C in the last 72 hours. CBG: No results for input(s): GLUCAP in the last 168 hours. Lipid Profile: No results for input(s): CHOL, HDL, LDLCALC, TRIG, CHOLHDL, LDLDIRECT in the last 72 hours. Thyroid Function Tests: Recent Labs    03/11/18 2020  TSH 3.571   Anemia Panel: No results for input(s): VITAMINB12, FOLATE, FERRITIN, TIBC, IRON, RETICCTPCT in the last 72 hours. Urine analysis:    Component Value Date/Time   COLORURINE YELLOW 03/11/2018 1755   APPEARANCEUR CLOUDY (A) 03/11/2018 1755   APPEARANCEUR Clear 07/31/2013 1404   LABSPEC 1.018 03/11/2018 1755   PHURINE 5.0 03/11/2018 1755   GLUCOSEU NEGATIVE 03/11/2018 1755   HGBUR MODERATE (A) 03/11/2018 1755   HGBUR negative 09/28/2009 1044   BILIRUBINUR NEGATIVE 03/11/2018 1755   BILIRUBINUR 1+ 10/31/2017 1721   BILIRUBINUR Negative 07/31/2013 1404   KETONESUR NEGATIVE 03/11/2018 1755   PROTEINUR 30 (A) 03/11/2018 1755   UROBILINOGEN 0.2 10/31/2017 1721   UROBILINOGEN 0.2 07/03/2012 1027   NITRITE NEGATIVE 03/11/2018 1755   LEUKOCYTESUR LARGE (A) 03/11/2018 1755   Sepsis Labs: '@LABRCNTIP' (procalcitonin:4,lacticidven:4) )No results found for this or any previous visit (from the past 240 hour(s)).   Radiological Exams on Admission: Dg Chest 2 View  Result Date: 03/11/2018 CLINICAL DATA:  Shortness of breath and fatigue. EXAM: CHEST - 2 VIEW COMPARISON:  09/05/2017 FINDINGS: The heart is mildly enlarged but stable. Slightly prominent mediastinal contours are also stable.  The pulmonary hila appear normal. Mild central vascular congestion but no overt pulmonary edema.  There is peribronchial thickening which could suggest bronchitis or reactive airways disease. No pleural effusions or focal infiltrates. The bony thorax is intact. IMPRESSION: Mild stable cardiac enlargement with mild central vascular congestion. No infiltrates, edema or effusions. Peribronchial thickening may suggest bronchitis. Electronically Signed   By: Marijo Sanes M.D.   On: 03/11/2018 13:33   Ct Angio Chest Pe W And/or Wo Contrast  Result Date: 03/12/2018 CLINICAL DATA:  Positive D-dimer with fatigue and dyspnea. History of breast cancer. EXAM: CT ANGIOGRAPHY CHEST WITH CONTRAST TECHNIQUE: Multidetector CT imaging of the chest was performed using the standard protocol during bolus administration of intravenous contrast. Multiplanar CT image reconstructions and MIPs were obtained to evaluate the vascular anatomy. CONTRAST:  59 mL ISOVUE-370 IOPAMIDOL (ISOVUE-370) INJECTION 76% COMPARISON:  12/04/2015 FINDINGS: Cardiovascular: Segmental and subsegmental filling defects to both lower lobes consistent with nonocclusive pulmonary emboli. No right heart strain with RV/LV ratio 0.63. Cardiomegaly without pericardial effusion. Prominent epicardial and mediastinal fat. Conventional branch pattern of the great vessels. Nonaneurysmal thoracic aorta. Mediastinum/Nodes: No enlarged mediastinal, hilar, or axillary lymph nodes. Thyroid gland, trachea, and esophagus demonstrate no significant findings. Lungs/Pleura: Faint diffuse ground-glass opacities likely due to hypoventilatory change. Alveolitis or pneumonitis or stigmata of mild CHF are among some other possibilities though favor hypoventilatory change. There is dependent bibasilar atelectasis noted as well. Upper Abdomen: No acute abnormality. Musculoskeletal: No chest wall abnormality. No acute or significant osseous findings. Review of the MIP images confirms the above findings. IMPRESSION: Positive for acute PE to the bilateral lower lobe subsegmental and  segmental pulmonary arteries without right heart strain. These results were called by telephone at the time of interpretation on 03/12/2018 at 2:13 am to Dr. Pryor Curia , who verbally acknowledged these results. Electronically Signed   By: Ashley Royalty M.D.   On: 03/12/2018 02:13    EKG: Independently reviewed.  Normal sinus rhythm.  Assessment/Plan Principal Problem:   Pulmonary embolism (HCC) Active Problems:   Obstructive sleep apnea   Hypothyroidism   Diabetes mellitus type 2 in obese ([place])   Acute pulmonary embolism ([place])    1. Acute respiratory failure with hypoxia likely from bilateral pulmonary embolism CAT scan does not show any strain pattern.  Patient has been started on heparin.  Will check Dopplers of the lower extremity since patient also has edema.  Given the exertional symptoms will check 2D echo and also due to chest tightness trend troponin.  Patient is mother has had multiple episodes of DVT and PE. 2. Diabetes mellitus type 2 on Lantus insulin 280 units in the morning.  We will keep patient on moderate dose sliding scale in addition.  Patient states her blood sugar has been running high recently. 3. Hypothyroidism on Synthroid. 4. History of asthma presently not wheezing. 5. Mildly elevated creatinine from baseline.  Closely follow metabolic panel. 6. Patient does have PRN Lasix which patient has not taken recently. 7. Chronic pain on buprenorphine patch and as needed oxycodone. 8. History of sleep apnea does not use CPAP.   DVT prophylaxis: Heparin. Code Status: Full code. Family Communication: Discussed with patient. Disposition Plan: Home. Consults called: None. Admission status: Observation.   Rise Patience MD [facility] Pager [phone].  If 7PM-7AM, please contact night-coverage [email or web address] Password [facility]  03/12/2018, 6:14 AM

## 2018-03-12 NOTE — Care Management Note (Signed)
Case Management Note  Patient Details  Name: Cheyenne Guzman MRN: [id number] Date of Birth: Feb 12, 1965  Subjective/Objective:    Bilateral PE. From home with husband/children.              Malak Duchesneau (Spouse)     [phone]       PCP: Dr. Petra Kuba, [phone]  Action/Plan: Transition to home with self care. NCM  awaiting call back from PCP's office regarding INR/coumadin [place] [facility].   Expected Discharge Date:    03/13/2018           Expected Discharge Plan:  Home/Self Care  In-House Referral:  NA  Discharge planning Services  CM Consult  Post Acute Care Choice:  NA Choice offered to:  NA  DME Arranged:  N/A DME Agency:  NA  HH Arranged:  NA HH Agency:  NA  Status of Service:  In process, will continue to follow  If discussed at Long Length of Stay Meetings, dates discussed:    Additional Comments:  Sharin Mons, RN 03/12/2018, 4:28 PM

## 2018-03-12 NOTE — Progress Notes (Signed)
ANTICOAGULATION CONSULT NOTE - Follow Up Consult  Pharmacy Consult for Coumadin/Lovenox Indication: B PE  Allergies  Allergen Reactions  . Vicodin [Hydrocodone-Acetaminophen] Nausea And Vomiting    Patient Measurements: Height: 5\' 7"  (170.2 cm) Weight: (!) 416 lb 1.6 oz (188.7 kg) IBW/kg (Calculated) : 61.6 Heparin Dosing Weight: 111 kg  Vital Signs: Temp: 98.5 F (36.9 C) (02/19 1438) Temp Source: Oral (02/19 1438) BP: 123/76 (02/19 1438) Pulse Rate: 74 (02/19 1438)  Labs: Recent Labs    03/11/18 1239 03/12/18 0622 03/12/18 1116  HGB 13.2  --   --   HCT 43.7  --   --   PLT 193  --   --   HEPARINUNFRC  --   --  0.11*  CREATININE 1.13*  --   --   TROPONINI  --  <0.03 <0.03    Estimated Creatinine Clearance: 102.2 mL/min (A) (by C-G formula based on SCr of 1.13 mg/dL (H)).   Assessment: Anticoag: B PE. Ddimer 0.54. Baseline CBC WNL. She is morbidly obese. The plan to transition her to Lovenox today. She weighs 190kg, therefore, the best clinical scenario for discharge is to dispense both 150mg  prefilled syringes and 40mg  syringes. She would need both shots twice daily.   Goal of Therapy:  Anti-Xa 0.6-1 INR 2-3 Monitor platelets by anticoagulation protocol: Yes   Plan:  Dc heparin Lovenox 190mg  SQ x1 then 190mg  SQ x1 at 0500 tomorrow then start 190mg  q12 Give Coumadin 7.5mg  (early AM) and 5mg  po x 1 tonight. Reduce ASA dose?   Onnie Boer, PharmD, BCIDP, AAHIVP, CPP Infectious Disease Pharmacist 03/12/2018 3:37 PM

## 2018-03-12 NOTE — Telephone Encounter (Signed)
I spoke to Cheyenne Guzman and it turns out that [employer] will no longer allow her to see me. She must see someone in the [place], so Cheyenne Guzman is trying to arrange for this

## 2018-03-12 NOTE — Discharge Instructions (Addendum)

## 2018-03-12 NOTE — Telephone Encounter (Signed)
Patient is to be discharged from hospital tomorrow on Lovenox and Coumadin. Discharge will not proceed until hospitalits knows for sure if Dr. Sarajane Jews will manage patient INR while on Coumadin. Patient will need an INR in 2-3 days. Dr. Sarajane Jews please confirm if you will manage patient's Coumadin? Levada Dy can be reached at [phone].

## 2018-03-12 NOTE — Plan of Care (Signed)
Bridging lovenox, coumadin.  Encouraging patient to get out of of bed.  Education of coumadin and lovenox injections per patient and family

## 2018-03-12 NOTE — Progress Notes (Addendum)
ANTICOAGULATION CONSULT NOTE - Initial Consult  Pharmacy Consult for heparin>>coumadin Indication: pulmonary embolus  Allergies  Allergen Reactions  . Vicodin [Hydrocodone-Acetaminophen] Nausea And Vomiting    Patient Measurements: Height: 5\' 7"  (170.2 cm) Weight: (!) 420 lb (190.5 kg)(estimate) IBW/kg (Calculated) : 61.6 Heparin Dosing Weight: 111 kg  Vital Signs: BP: 123/78 (02/19 0100) Pulse Rate: 93 (02/19 0100)  Labs: Recent Labs    03/11/18 1239  HGB 13.2  HCT 43.7  PLT 193  CREATININE 1.13*    Estimated Creatinine Clearance: 102.9 mL/min (A) (by C-G formula based on SCr of 1.13 mg/dL (H)).   Medical History: Past Medical History:  Diagnosis Date  . Anxiety    per pt. - mild   . Arthritis    back & ankles   . Asthma    seasonal   . Cancer [facility])    breast, sees Dr. Jana Hakim , Left - 11/2007  . Complication of anesthesia 2013   severe muscle spasms-sore-no doc  . Depression   . Diabetes mellitus    sees Dr. Chalmers Cater, diagnosed 2003  . HA (headache)   . Hearing loss    Went to [facility]ro[facility]  . Hyperlipidemia   . Kidney infection    - hosp. [facility]- septic- 10/2009  . Low back pain   . Peripheral neuropathy    R sided numbness- face & jaw  . Sleep apnea 9/15   severe-just started cpap-doing well  . TIA (transient ischemic attack)    in 2007,2008, and 2011  . Tinnitus of right ear   . Urosepsis 08-2009   with Klebsiella    Medications:  See medication history  Assessment: 53 yo lady to start heparin>>coumadin for PE.  She was not on anticoagulation PTA.  Baseline Hg 13.2, PTLC 193 Goal of Therapy:  Heparin level 0.3-0.7 units/ml Monitor platelets by anticoagulation protocol: Yes   Plan:  Heparin 6000 unit bolus and drip at 2000 units/hr Check heparin level 6-8 hours after start Daily HL and CBC Coumadin 15 mg po x 1 today Daily PT/INR Monitor for bleeding complications  Thanks for allowing pharmacy to be  a part of this patient's care.  Excell Seltzer, PharmD Clinical Pharmacist 03/12/2018,2:21 AM

## 2018-03-12 NOTE — Progress Notes (Signed)
ANTICOAGULATION CONSULT NOTE - Follow Up Consult  Pharmacy Consult for Heparin and Coumadin Indication: B PE  Allergies  Allergen Reactions  . Vicodin [Hydrocodone-Acetaminophen] Nausea And Vomiting    Patient Measurements: Height: 5\' 7"  (170.2 cm) Weight: (!) 416 lb 1.6 oz (188.7 kg) IBW/kg (Calculated) : 61.6 Heparin Dosing Weight: 111 kg  Vital Signs: Temp: 99.6 F (37.6 C) (02/19 0454) Temp Source: Oral (02/19 0454) BP: 165/83 (02/19 0454) Pulse Rate: 91 (02/19 0454)  Labs: Recent Labs    03/11/18 1239 03/12/18 0622 03/12/18 1116  HGB 13.2  --   --   HCT 43.7  --   --   PLT 193  --   --   HEPARINUNFRC  --   --  0.11*  CREATININE 1.13*  --   --   TROPONINI  --  <0.03 <0.03    Estimated Creatinine Clearance: 102.2 mL/min (A) (by C-G formula based on SCr of 1.13 mg/dL (H)).   Assessment: Anticoag: B PE. Ddimer 0.54. Baseline CBC WNL. HL 0.11 low.  Goal of Therapy:  Heparin level 0.3-0.7 units/ml  INR 2-3 Monitor platelets by anticoagulation protocol: Yes   Plan:  Give heparin 3000 unit bolus and increase infusion to 2600 units/hr Recheck heparin level in 6hrs Daily HL and CBC and INR Give Coumadin 7.5mg  (early AM) and 5mg  po x 1 tonight. Reduce ASA dose? Likely home 2/20 on LMWH  Cheyenne Guzman, PharmD, BCPS Clinical Staff Pharmacist Cheyenne Guzman 03/12/2018,12:40 PM

## 2018-03-12 NOTE — Progress Notes (Signed)
Benefits check in process for Lovenox 190 mg BID. Whitman Hero RN,BSN,CM

## 2018-03-12 NOTE — Progress Notes (Signed)
  Echocardiogram 2D Echocardiogram has been performed.  Cheyenne Guzman 03/12/2018, 10:18 AM

## 2018-03-12 NOTE — ED Notes (Signed)
Patient transported to CT 

## 2018-03-12 NOTE — Progress Notes (Signed)
PROGRESS NOTE        PATIENT DETAILS Name: Cheyenne Guzman Age: 53 y.o. Sex: female Date of Birth: 1965-09-27 Admit Date: 03/11/2018 Admitting Physician Rise Patience, MD PCP:Fry, Ishmael Holter, MD  Brief Narrative: Patient is a 53 y.o. female with history of morbid obesity, hypertension, DM-2 presenting with exertional dyspnea, found to have pulmonary embolism.  See below for further details  Subjective: No shortness of breath at rest-has really not ambulated in the hospital since admission.  No chest pain.  Assessment/Plan: Pulmonary embolism: Suspect could be provoked from a sedentary lifestyle-she also has morbid obesity which probably is a risk factor.  Continue IV heparin for today-due to morbid obesity-probably better to start Coumadin per pharmacy.  Transthoracic echocardiogram without RV strain, lower extremity Doppler without any obvious DVT (but certain veins were not visualized).  If she continues to do well, suspect she can be transitioned to Lovenox and overlapping Coumadin.  Will likely require hematology evaluation in the outpatient setting of the next few months before discontinuation of anticoagulation.  DM-2: Remain on the higher side-continue Lantus to 80 units daily, change SSI to set resistant scale.  Hypothyroidism: Continue with Synthroid  Chronic pain syndrome: Continue buprenorphine patch and as needed oxycodone.  History of asthma: Stable-no wheezing  History of OSA: Per H&P patient does not use CPAP  Morbid obesity  DVT Prophylaxis: Full dose anticoagulation with Heparin/Coumadin  Code Status: Full code   Family Communication: Noneat bedside  Disposition Plan: Remain inpatient  Antimicrobial agents: Anti-infectives (From admission, onward)   Start     Dose/Rate Route Frequency Ordered Stop   03/12/18 1800  cefTRIAXone (ROCEPHIN) 1 g in sodium chloride 0.9 % 100 mL IVPB     1 g 200 mL/hr over 30 Minutes Intravenous  Every 24 hours 03/12/18 0614     03/12/18 0030  cefTRIAXone (ROCEPHIN) 1 g in sodium chloride 0.9 % 100 mL IVPB     1 g 200 mL/hr over 30 Minutes Intravenous  Once 03/12/18 0015 03/12/18 0112      Procedures: None  CONSULTS:  None  Time spent: 25- minutes-Greater than 50% of this time was spent in counseling, explanation of diagnosis, planning of further management, and coordination of care.  MEDICATIONS: Scheduled Meds: . aspirin  325 mg Oral QHS  . [START ON 03/18/2018] Buprenorphine  1 patch Transdermal Q Tue  . gabapentin  300 mg Oral TID  . insulin aspart  0-15 Units Subcutaneous TID WC  . insulin glargine  280 Units Subcutaneous BH-q7a  . levothyroxine  75 mcg Oral Q0600  . methocarbamol  750 mg Oral TID  . sertraline  200 mg Oral Daily  . warfarin  5 mg Oral ONCE-1800  . Warfarin - Pharmacist Dosing Inpatient   Does not apply q1800   Continuous Infusions: . cefTRIAXone (ROCEPHIN)  IV    . heparin 2,600 Units/hr (03/12/18 1500)   PRN Meds:.albuterol, ALPRAZolam, ondansetron **OR** ondansetron (ZOFRAN) IV, oxyCODONE   PHYSICAL EXAM: Vital signs: Vitals:   03/12/18 0230 03/12/18 0330 03/12/18 0454 03/12/18 1438  BP: 114/79 116/74 (!) 165/83 123/76  Pulse: 96  91 74  Resp: 20 15 20 20   Temp:   99.6 F (37.6 C) 98.5 F (36.9 C)  TempSrc:   Oral Oral  SpO2: 92%  97% 97%  Weight:   (!) 188.7 kg  Height:       Filed Weights   03/12/18 0200 03/12/18 0454  Weight: (!) 190.5 kg (!) 188.7 kg   Body mass index is 65.17 kg/m.   General appearance :Awake, alert, not in any distress.  HEENT: Atraumatic and Normocephalic Neck: supple, no JVD. No cervical lymphadenopathy. No thyromegaly Resp:Good air entry bilaterally, no added sounds  CVS: S1 S2 regular, no murmurs.  GI: Bowel sounds present, Non tender and not distended with no gaurding, rigidity or rebound.No organomegaly Extremities: B/L Lower Ext shows no edema, both legs are warm to touch Neurology:  speech  clear,Non focal, sensation is grossly intact. Psychiatric: Normal judgment and insight. Alert and oriented x 3. Normal mood. Musculoskeletal:No digital cyanosis Skin:No Rash, warm and dry Wounds:N/A  I have personally reviewed following labs and imaging studies  LABORATORY DATA: CBC: Recent Labs  Lab 03/11/18 1239  WBC 7.9  HGB 13.2  HCT 43.7  MCV 81.7  PLT 300    Basic Metabolic Panel: Recent Labs  Lab 03/11/18 1239 03/12/18 0622  NA 136  --   K 4.3  --   CL 101  --   CO2 24  --   GLUCOSE 291*  --   BUN 16  --   CREATININE 1.13*  --   CALCIUM 9.0  --   MG  --  2.0    GFR: Estimated Creatinine Clearance: 102.2 mL/min (A) (by C-G formula based on SCr of 1.13 mg/dL (H)).  Liver Function Tests: No results for input(s): AST, ALT, ALKPHOS, BILITOT, PROT, ALBUMIN in the last 168 hours. No results for input(s): LIPASE, AMYLASE in the last 168 hours. No results for input(s): AMMONIA in the last 168 hours.  Coagulation Profile: No results for input(s): INR, PROTIME in the last 168 hours.  Cardiac Enzymes: Recent Labs  Lab 03/12/18 0622 03/12/18 1116  TROPONINI <0.03 <0.03    BNP (last 3 results) No results for input(s): PROBNP in the last 8760 hours.  HbA1C: No results for input(s): HGBA1C in the last 72 hours.  CBG: Recent Labs  Lab 03/12/18 0825 03/12/18 1215  GLUCAP 249* 257*    Lipid Profile: No results for input(s): CHOL, HDL, LDLCALC, TRIG, CHOLHDL, LDLDIRECT in the last 72 hours.  Thyroid Function Tests: Recent Labs    03/12/18 0622  TSH 4.332    Anemia Panel: No results for input(s): VITAMINB12, FOLATE, FERRITIN, TIBC, IRON, RETICCTPCT in the last 72 hours.  Urine analysis:    Component Value Date/Time   COLORURINE YELLOW 03/11/2018 1755   APPEARANCEUR CLOUDY (A) 03/11/2018 1755   APPEARANCEUR Clear 07/31/2013 1404   LABSPEC 1.018 03/11/2018 1755   PHURINE 5.0 03/11/2018 1755   GLUCOSEU NEGATIVE 03/11/2018 1755   HGBUR MODERATE  (A) 03/11/2018 1755   HGBUR negative 09/28/2009 1044   BILIRUBINUR NEGATIVE 03/11/2018 1755   BILIRUBINUR 1+ 10/31/2017 1721   BILIRUBINUR Negative 07/31/2013 1404   KETONESUR NEGATIVE 03/11/2018 1755   PROTEINUR 30 (A) 03/11/2018 1755   UROBILINOGEN 0.2 10/31/2017 1721   UROBILINOGEN 0.2 07/03/2012 1027   NITRITE NEGATIVE 03/11/2018 1755   LEUKOCYTESUR LARGE (A) 03/11/2018 1755    Sepsis Labs: Lactic Acid, Venous    Component Value Date/Time   LATICACIDVEN 1.09 09/05/2017 0747    MICROBIOLOGY: No results found for this or any previous visit (from the past 240 hour(s)).  RADIOLOGY STUDIES/RESULTS: Dg Chest 2 View  Result Date: 03/11/2018 CLINICAL DATA:  Shortness of breath and fatigue. EXAM: CHEST - 2 VIEW COMPARISON:  09/05/2017  FINDINGS: The heart is mildly enlarged but stable. Slightly prominent mediastinal contours are also stable. The pulmonary hila appear normal. Mild central vascular congestion but no overt pulmonary edema. There is peribronchial thickening which could suggest bronchitis or reactive airways disease. No pleural effusions or focal infiltrates. The bony thorax is intact. IMPRESSION: Mild stable cardiac enlargement with mild central vascular congestion. No infiltrates, edema or effusions. Peribronchial thickening may suggest bronchitis. Electronically Signed   By: Marijo Sanes M.D.   On: 03/11/2018 13:33   Ct Angio Chest Pe W And/or Wo Contrast  Result Date: 03/12/2018 CLINICAL DATA:  Positive D-dimer with fatigue and dyspnea. History of breast cancer. EXAM: CT ANGIOGRAPHY CHEST WITH CONTRAST TECHNIQUE: Multidetector CT imaging of the chest was performed using the standard protocol during bolus administration of intravenous contrast. Multiplanar CT image reconstructions and MIPs were obtained to evaluate the vascular anatomy. CONTRAST:  59 mL ISOVUE-370 IOPAMIDOL (ISOVUE-370) INJECTION 76% COMPARISON:  12/04/2015 FINDINGS: Cardiovascular: Segmental and subsegmental  filling defects to both lower lobes consistent with nonocclusive pulmonary emboli. No right heart strain with RV/LV ratio 0.63. Cardiomegaly without pericardial effusion. Prominent epicardial and mediastinal fat. Conventional branch pattern of the great vessels. Nonaneurysmal thoracic aorta. Mediastinum/Nodes: No enlarged mediastinal, hilar, or axillary lymph nodes. Thyroid gland, trachea, and esophagus demonstrate no significant findings. Lungs/Pleura: Faint diffuse ground-glass opacities likely due to hypoventilatory change. Alveolitis or pneumonitis or stigmata of mild CHF are among some other possibilities though favor hypoventilatory change. There is dependent bibasilar atelectasis noted as well. Upper Abdomen: No acute abnormality. Musculoskeletal: No chest wall abnormality. No acute or significant osseous findings. Review of the MIP images confirms the above findings. IMPRESSION: Positive for acute PE to the bilateral lower lobe subsegmental and segmental pulmonary arteries without right heart strain. These results were called by telephone at the time of interpretation on 03/12/2018 at 2:13 am to Dr. Pryor Curia , who verbally acknowledged these results. Electronically Signed   By: Ashley Royalty M.D.   On: 03/12/2018 02:13   Vas [place] Lower Extremity Venous (dvt)  Result Date: 03/12/2018  Lower Venous Study Risk Factors: Confirmed PE. Limitations: Body habitus. Performing Technologist: June Leap RDMS, RVT  Examination Guidelines: A complete evaluation includes B-mode imaging, spectral Doppler, color Doppler, and power Doppler as needed of all accessible portions of each vessel. Bilateral testing is considered an integral part of a complete examination. Limited examinations for reoccurring indications may be performed as noted.  Right Venous Findings: +---------+---------------+---------+-----------+----------+--------------+          CompressibilityPhasicitySpontaneityPropertiesSummary         +---------+---------------+---------+-----------+----------+--------------+ CFV      Full           Yes      Yes                                 +---------+---------------+---------+-----------+----------+--------------+ SFJ      Full                                                        +---------+---------------+---------+-----------+----------+--------------+ FV Prox  Full                                                        +---------+---------------+---------+-----------+----------+--------------+  FV Mid   Full                                                        +---------+---------------+---------+-----------+----------+--------------+ FV DistalFull                                                        +---------+---------------+---------+-----------+----------+--------------+ PFV                                                   Not visualized +---------+---------------+---------+-----------+----------+--------------+ POP      Full           Yes      Yes                                 +---------+---------------+---------+-----------+----------+--------------+ PTV                                                   Not visualized +---------+---------------+---------+-----------+----------+--------------+ PERO                                                  Not visualized +---------+---------------+---------+-----------+----------+--------------+  Left Venous Findings: +---------+---------------+---------+-----------+----------+--------------+          CompressibilityPhasicitySpontaneityPropertiesSummary        +---------+---------------+---------+-----------+----------+--------------+ CFV      Full           Yes      Yes                                 +---------+---------------+---------+-----------+----------+--------------+ SFJ      Full                                                         +---------+---------------+---------+-----------+----------+--------------+ FV Prox  Full                                                        +---------+---------------+---------+-----------+----------+--------------+ FV Mid                  Yes      Yes                                 +---------+---------------+---------+-----------+----------+--------------+  FV Distal                                             Not visualized +---------+---------------+---------+-----------+----------+--------------+ PFV                                                   Not visualized +---------+---------------+---------+-----------+----------+--------------+ POP      Full           Yes      Yes                                 +---------+---------------+---------+-----------+----------+--------------+ PTV                                                   Not visualized +---------+---------------+---------+-----------+----------+--------------+ PERO                                                  Not visualized +---------+---------------+---------+-----------+----------+--------------+    Summary: Right: There is no evidence of deep vein thrombosis in the lower extremity. However, portions of this examination were limited- see technologist comments above. Left: There is no evidence of deep vein thrombosis in the lower extremity. However, portions of this examination were limited- see technologist comments above.  *See table(s) above for measurements and observations. Electronically signed by Servando Snare MD on 03/12/2018 at 2:12:21 PM.    Final      LOS: 0 days   Oren Binet, MD  [facility]  If 7PM-7AM, please contact night-coverage  Please page via [email or web address]  Go to [email or web address] and use [facility]'s universal password to access. If you do not have the password, please contact the hospital operator.  Locate the [facility] provider you are looking for under [facility] and page to a number that you can be directly reached. If you still have difficulty reaching the provider, please page the [facility] (Director on Call) for the Hospitalists listed on amion for assistance.  03/12/2018, 3:11 PM

## 2018-03-12 NOTE — ED Notes (Signed)
Report given to RN on 5W. 

## 2018-03-13 LAB — CBC
HEMATOCRIT: 40.2 % (ref 36.0–46.0)
Hemoglobin: 12.5 g/dL (ref 12.0–15.0)
MCH: 25.2 pg — ABNORMAL LOW (ref 26.0–34.0)
MCHC: 31.1 g/dL (ref 30.0–36.0)
MCV: 80.9 fL (ref 80.0–100.0)
Platelets: 159 10*3/uL (ref 150–400)
RBC: 4.97 MIL/uL (ref 3.87–5.11)
RDW: 15.7 % — ABNORMAL HIGH (ref 11.5–15.5)
WBC: 7 10*3/uL (ref 4.0–10.5)
nRBC: 0 % (ref 0.0–0.2)

## 2018-03-13 LAB — BASIC METABOLIC PANEL
Anion gap: 12 (ref 5–15)
BUN: 17 mg/dL (ref 6–20)
CHLORIDE: 102 mmol/L (ref 98–111)
CO2: 21 mmol/L — AB (ref 22–32)
Calcium: 8.4 mg/dL — ABNORMAL LOW (ref 8.9–10.3)
Creatinine, Ser: 1.12 mg/dL — ABNORMAL HIGH (ref 0.44–1.00)
GFR calc Af Amer: >60 mL/min (ref >60)
GFR calc non Af Amer: 56 mL/min — ABNORMAL LOW (ref >60)
Glucose, Bld: 245 mg/dL — ABNORMAL HIGH (ref 70–99)
Potassium: 3.9 mmol/L (ref 3.5–5.1)
Sodium: 135 mmol/L (ref 135–145)

## 2018-03-13 LAB — GLUCOSE, CAPILLARY
Glucose-Capillary: 223 mg/dL — ABNORMAL HIGH (ref 70–99)
Glucose-Capillary: 174 mg/dL — ABNORMAL HIGH (ref 70–99)

## 2018-03-13 LAB — PROTIME-INR
INR: 1.12
Prothrombin Time: 14.3 seconds (ref 11.4–15.2)

## 2018-03-13 MED ORDER — POLYETHYLENE GLYCOL 3350 17 G PO PACK
17.0000 g | PACK | Freq: Once | ORAL | Status: AC
  Administered 2018-03-13: 17 g via ORAL
  Filled 2018-03-13: qty 1

## 2018-03-13 MED ORDER — CEPHALEXIN 500 MG PO CAPS: 500.0000 mg | ORAL_CAPSULE | Freq: Three times a day (TID) | ORAL | 0 refills | Status: DC

## 2018-03-13 MED ORDER — WARFARIN SODIUM 5 MG PO TABS: 10.0000 mg | ORAL_TABLET | Freq: Every day | ORAL | 0 refills | Status: DC

## 2018-03-13 MED ORDER — ENOXAPARIN SODIUM 300 MG/3ML IJ SOLN: 190.0000 mg | Freq: Two times a day (BID) | INTRAMUSCULAR | 0 refills | Status: DC

## 2018-03-13 MED ORDER — SODIUM CHLORIDE 0.9 % IV SOLN
1.0000 g | INTRAVENOUS | Status: DC
  Administered 2018-03-13: 1 g via INTRAVENOUS
  Filled 2018-03-13: qty 10

## 2018-03-13 MED FILL — ENOXAPARIN 100 MG/ML SYR: 100 | 7 days supply | Qty: 28 | Fill #0

## 2018-03-13 MED FILL — WARFARIN SODIUM 5 MG TABLET: 5 | 30 days supply | Qty: 60 | Fill #0

## 2018-03-13 MED FILL — CEPHALEXIN 500 MG CAPSULE: 500 | 3 days supply | Qty: 3 | Fill #0

## 2018-03-13 NOTE — Discharge Summary (Signed)
PATIENT DETAILS Name: Cheyenne Guzman Age: 53 y.o. Sex: female Date of Birth: [birth year] MRN: [id number]. Admitting Physician: Rise Patience, MD PCP:Fry, Ishmael Holter, MD  Admit Date: 03/11/2018 Discharge date: 03/13/2018  Recommendations for Outpatient Follow-up:  1. Follow up with PCP in 1-2 weeks 2. Please obtain BMP/CBC in one week 3. Please follow INR-once therapeutic for 2 continues days, stop Lovenox. 4. Needs a referral to outpatient hematology before anticoagulation is discontinued. 5. Follow urine cultures until final  Admitted From:  Home  Disposition: [place]: No  Equipment/Devices: None  Discharge Condition: Stable  CODE STATUS: FULL CODE  Diet recommendation:  Heart Healthy / Carb Modified   Brief Summary: See H&P, Labs, Consult and Test reports for all details in brief,Patient is a 53 y.o. female with history of morbid obesity, hypertension, DM-2 presenting with exertional dyspnea, found to have pulmonary embolism.  See below for further details  Brief Hospital Course: Pulmonary embolism: Suspect could be provoked from a sedentary lifestyle-she also has morbid obesity which probably is a risk factor.  Was initially on IV heparin but switched over to Lovenox-given morbid obesity-it was decided that we will place on Coumadin rather than the newer novel agents.  Transthoracic echocardiogram with preserved EF, without RV strain.  Lower extremity Doppler without any obvious DVT but given body habitus-certain veins were not visualized.  She is symptomatically better-has ambulated in the room with significant improvement in her symptoms.  She is familiar with Lovenox injections (on insulin at home)-and feels  she can inject herself at home, Lovenox teachings will be done by the nursing staff before discharge.  Case management has arranged for follow-up with PCP for INR check.  Once INR is therapeutic for 2 days, Lovenox can be discontinued.  Will  likely require hematology evaluation in the outpatient setting of the next few months before discontinuation of anticoagulation-this has been deferred to PCP.  UTI: Did have some dysuria-treated with Rocephin-we will give 1 additional dose today, and will place on Keflex for 1 additional day-this will complete a 3-day course.  Follow urine cultures at PCPs office.  DM-2:  CBG is relatively stable-continue usual regimen of Lantus.  Hypothyroidism: Continue with Synthroid  Chronic pain syndrome: Continue buprenorphine patch and as needed oxycodone.  There was no adverse effects during this hospital stay.  Defer to PCP.  History of asthma: Stable-no wheezing  History of OSA: Per H&P patient does not use CPAP  Morbid obesity  Procedures/Studies: None  Discharge Diagnoses:  Principal Problem:   Pulmonary embolism ([place]) Active Problems:   Obstructive sleep apnea   Hypothyroidism   Diabetes mellitus type 2 in obese ([place])   Acute pulmonary embolism [facility])   Discharge Instructions:  Activity:  As tolerated with Full fall precautions use walker/cane & assistance as needed  Discharge Instructions    Call MD for:  difficulty breathing, headache or visual disturbances   Complete by:  As directed    Diet - low sodium heart healthy   Complete by:  As directed    Diet Carb Modified   Complete by:  As directed    Discharge instructions   Complete by:  As directed    Follow with Primary MD  Laurey Morale, MD in next 2-3 days for a INR check  Ask your Primary care MD to refer you to a hematologist in next 2-3 months  Please get a complete blood count and chemistry panel checked by your Primary MD  at your next visit, and again as instructed by your Primary MD.  Get Medicines reviewed and adjusted: Please take all your medications with you for your next visit with your Primary MD  Laboratory/radiological data: Please request your Primary MD to go over all hospital tests and  procedure/radiological results at the follow up, please ask your Primary MD to get all Hospital records sent to his/her office.  In some cases, they will be blood work, cultures and biopsy results pending at the time of your discharge. Please request that your primary care M.D. follows up on these results.  Also Note the following: If you experience worsening of your admission symptoms, develop shortness of breath, life threatening emergency, suicidal or homicidal thoughts you must seek medical attention immediately by calling 911 or calling your MD immediately  if symptoms less severe.  You must read complete instructions/literature along with all the possible adverse reactions/side effects for all the Medicines you take and that have been prescribed to you. Take any new Medicines after you have completely understood and accpet all the possible adverse reactions/side effects.   Do not drive when taking Pain medications or sleeping medications (Benzodaizepines)  Do not take more than prescribed Pain, Sleep and Anxiety Medications. It is not advisable to combine anxiety,sleep and pain medications without talking with your primary care practitioner  Special Instructions: If you have smoked or chewed Tobacco  in the last 2 yrs please stop smoking, stop any regular Alcohol  and or any Recreational drug use.  Wear Seat belts while driving.  Please note: You were cared for by a hospitalist during your hospital stay. Once you are discharged, your primary care physician will handle any further medical issues. Please note that NO REFILLS for any discharge medications will be authorized once you are discharged, as it is imperative that you return to your primary care physician (or establish a relationship with a primary care physician if you do not have one) for your post hospital discharge needs so that they can reassess your need for medications and monitor your lab values.   Increase activity slowly    Complete by:  As directed      Allergies as of 03/13/2018      Reactions   Vicodin [hydrocodone-acetaminophen] Nausea And Vomiting      Medication List    STOP taking these medications   aspirin 325 MG EC tablet   ciprofloxacin 500 MG tablet Commonly known as:  CIPRO     TAKE these medications   albuterol (2.5 MG/3ML) 0.083% nebulizer solution Commonly known as:  PROVENTIL 1 VIAL IN NEBULIZER EVERY 4 HOURS AS NEEDED FOR WHEEZING What changed:  See the new instructions.   albuterol 108 (90 Base) MCG/ACT inhaler Commonly known as:  PROVENTIL HFA;VENTOLIN HFA Inhale 2 puffs into the lungs every 4 (four) hours as needed for wheezing or shortness of breath. What changed:  Another medication with the same name was changed. Make sure you understand how and when to take each.   ALPRAZolam 1 MG tablet Commonly known as:  XANAX TAKE 1 TABLET BY MOUTH THREE TIMES A DAY   Buprenorphine 15 MCG/HR Ptwk Place 1 patch onto the skin every Tuesday.   cephALEXin 500 MG capsule Commonly known as:  KEFLEX Take 1 capsule (500 mg total) by mouth 3 (three) times daily. Start taking on:  March 14, 2018   CONTOUR NEXT MONITOR w/Device Kit 1 each by Does not apply route 2 (two) times daily. To check  blood sugars twice a day.   enoxaparin 300 MG/3ML Soln injection Commonly known as:  LOVENOX Inject 1.9 mLs (190 mg total) into the skin every 12 (twelve) hours for 7 days. OK TO GIVE 150 AND 40 MG SYRINGES TO MAKE A TOTAL OF 190 MG   furosemide 40 MG tablet Commonly known as:  LASIX TAKE 1 TABLET (40 MG TOTAL) BY MOUTH DAILY. AS NEEDED FOR SWELLING. What changed:  additional instructions   gabapentin 300 MG capsule Commonly known as:  NEURONTIN Take 300 mg by mouth 3 (three) times daily.   glucose blood test strip Use as instructed   glucose blood test strip Commonly known as:  CONTOUR NEXT TEST Use to test blood sugar 2 times daily   Insulin Glargine 100 UNIT/ML Solostar  Pen Commonly known as:  LANTUS SOLOSTAR Inject 280 Units into the skin every morning. and pen needles 3/day   levothyroxine 75 MCG tablet Commonly known as:  SYNTHROID, LEVOTHROID Take 1 tablet (75 mcg total) by mouth daily.   methocarbamol 750 MG tablet Commonly known as:  ROBAXIN Take 1 tablet (750 mg total) by mouth 2 (two) times daily. What changed:  when to take this   Oxycodone HCl 10 MG Tabs Take 10 mg by mouth daily as needed (breakthrough pain).   sertraline 100 MG tablet Commonly known as:  ZOLOFT TAKE 2 TABLETS (200 MG TOTAL) BY MOUTH AT BEDTIME. What changed:  when to take this   warfarin 5 MG tablet Commonly known as:  COUMADIN Take 2 tablets (10 mg total) by mouth daily at 6 PM.      Follow-up [place] @ [facility]. Go on 03/14/2018.   Why:  11:40 am, Dr. Carmin Muskrat information: [phone]          Allergies  Allergen Reactions  . Vicodin [Hydrocodone-Acetaminophen] Nausea And Vomiting    Consultations:   None  Other Procedures/Studies: Dg Chest 2 View  Result Date: 03/11/2018 CLINICAL DATA:  Shortness of breath and fatigue. EXAM: CHEST - 2 VIEW COMPARISON:  09/05/2017 FINDINGS: The heart is mildly enlarged but stable. Slightly prominent mediastinal contours are also stable. The pulmonary hila appear normal. Mild central vascular congestion but no overt pulmonary edema. There is peribronchial thickening which could suggest bronchitis or reactive airways disease. No pleural effusions or focal infiltrates. The bony thorax is intact. IMPRESSION: Mild stable cardiac enlargement with mild central vascular congestion. No infiltrates, edema or effusions. Peribronchial thickening may suggest bronchitis. Electronically Signed   By: Marijo Sanes M.D.   On: 03/11/2018 13:33   Ct Angio Chest Pe W And/or Wo Contrast  Result Date: 03/12/2018 CLINICAL DATA:  Positive D-dimer with fatigue and dyspnea. History of breast cancer.  EXAM: CT ANGIOGRAPHY CHEST WITH CONTRAST TECHNIQUE: Multidetector CT imaging of the chest was performed using the standard protocol during bolus administration of intravenous contrast. Multiplanar CT image reconstructions and MIPs were obtained to evaluate the vascular anatomy. CONTRAST:  59 mL ISOVUE-370 IOPAMIDOL (ISOVUE-370) INJECTION 76% COMPARISON:  12/04/2015 FINDINGS: Cardiovascular: Segmental and subsegmental filling defects to both lower lobes consistent with nonocclusive pulmonary emboli. No right heart strain with RV/LV ratio 0.63. Cardiomegaly without pericardial effusion. Prominent epicardial and mediastinal fat. Conventional branch pattern of the great vessels. Nonaneurysmal thoracic aorta. Mediastinum/Nodes: No enlarged mediastinal, hilar, or axillary lymph nodes. Thyroid gland, trachea, and esophagus demonstrate no significant findings. Lungs/Pleura: Faint diffuse ground-glass opacities likely due to hypoventilatory change. Alveolitis or pneumonitis or stigmata of mild CHF  are among some other possibilities though favor hypoventilatory change. There is dependent bibasilar atelectasis noted as well. Upper Abdomen: No acute abnormality. Musculoskeletal: No chest wall abnormality. No acute or significant osseous findings. Review of the MIP images confirms the above findings. IMPRESSION: Positive for acute PE to the bilateral lower lobe subsegmental and segmental pulmonary arteries without right heart strain. These results were called by telephone at the time of interpretation on 03/12/2018 at 2:13 am to Dr. Pryor Curia , who verbally acknowledged these results. Electronically Signed   By: Ashley Royalty M.D.   On: 03/12/2018 02:13   Vas [place] Lower Extremity Venous (dvt)  Result Date: 03/12/2018  Lower Venous Study Risk Factors: Confirmed PE. Limitations: Body habitus. Performing Technologist: June Leap RDMS, RVT  Examination Guidelines: A complete evaluation includes B-mode imaging, spectral Doppler,  color Doppler, and power Doppler as needed of all accessible portions of each vessel. Bilateral testing is considered an integral part of a complete examination. Limited examinations for reoccurring indications may be performed as noted.  Right Venous Findings: +---------+---------------+---------+-----------+----------+--------------+          CompressibilityPhasicitySpontaneityPropertiesSummary        +---------+---------------+---------+-----------+----------+--------------+ CFV      Full           Yes      Yes                                 +---------+---------------+---------+-----------+----------+--------------+ SFJ      Full                                                        +---------+---------------+---------+-----------+----------+--------------+ FV Prox  Full                                                        +---------+---------------+---------+-----------+----------+--------------+ FV Mid   Full                                                        +---------+---------------+---------+-----------+----------+--------------+ FV DistalFull                                                        +---------+---------------+---------+-----------+----------+--------------+ PFV                                                   Not visualized +---------+---------------+---------+-----------+----------+--------------+ POP      Full           Yes      Yes                                 +---------+---------------+---------+-----------+----------+--------------+  PTV                                                   Not visualized +---------+---------------+---------+-----------+----------+--------------+ PERO                                                  Not visualized +---------+---------------+---------+-----------+----------+--------------+  Left Venous Findings:  +---------+---------------+---------+-----------+----------+--------------+          CompressibilityPhasicitySpontaneityPropertiesSummary        +---------+---------------+---------+-----------+----------+--------------+ CFV      Full           Yes      Yes                                 +---------+---------------+---------+-----------+----------+--------------+ SFJ      Full                                                        +---------+---------------+---------+-----------+----------+--------------+ FV Prox  Full                                                        +---------+---------------+---------+-----------+----------+--------------+ FV Mid                  Yes      Yes                                 +---------+---------------+---------+-----------+----------+--------------+ FV Distal                                             Not visualized +---------+---------------+---------+-----------+----------+--------------+ PFV                                                   Not visualized +---------+---------------+---------+-----------+----------+--------------+ POP      Full           Yes      Yes                                 +---------+---------------+---------+-----------+----------+--------------+ PTV                                                   Not visualized +---------+---------------+---------+-----------+----------+--------------+ PERO  Not visualized +---------+---------------+---------+-----------+----------+--------------+    Summary: Right: There is no evidence of deep vein thrombosis in the lower extremity. However, portions of this examination were limited- see technologist comments above. Left: There is no evidence of deep vein thrombosis in the lower extremity. However, portions of this examination were limited- see technologist comments above.  *See table(s) above  for measurements and observations. Electronically signed by Servando Snare MD on 03/12/2018 at 2:12:21 PM.    Final      TODAY-DAY OF DISCHARGE:  Subjective:   [place] Sic today has no headache,no chest abdominal pain,no new weakness tingling or numbness, feels much better wants to go home today.  Objective:   Blood pressure (!) 143/74, pulse 81, temperature (!) 97.5 F (36.4 C), temperature source Oral, resp. rate 18, height _0  (1.702 m), weight (!) 191.1 kg, SpO2 97 %.  Intake/Output Summary (Last 24 hours) at 03/13/2018 1046 Last data filed at 03/12/2018 1800 Gross per 24 hour  Intake 847.66 ml  Output 525 ml  Net 322.66 ml   Filed Weights   03/12/18 0200 03/12/18 0454 03/13/18 0545  Weight: (!) 190.5 kg (!) 188.7 kg (!) 191.1 kg    Exam: Awake Alert, Oriented *3, No new F.N deficits, Normal affect [place].AT,PERRAL Supple Neck,No JVD, No cervical lymphadenopathy appriciated.  Symmetrical Chest wall movement, Good air movement bilaterally, CTAB RRR,No Gallops,Rubs or new Murmurs, No Parasternal Heave +ve B.Sounds, Abd Soft, Non tender, No organomegaly appriciated, No rebound -guarding or rigidity. No Cyanosis, Clubbing or edema, No new Rash or bruise   PERTINENT RADIOLOGIC STUDIES: Dg Chest 2 View  Result Date: 03/11/2018 CLINICAL DATA:  Shortness of breath and fatigue. EXAM: CHEST - 2 VIEW COMPARISON:  09/05/2017 FINDINGS: The heart is mildly enlarged but stable. Slightly prominent mediastinal contours are also stable. The pulmonary hila appear normal. Mild central vascular congestion but no overt pulmonary edema. There is peribronchial thickening which could suggest bronchitis or reactive airways disease. No pleural effusions or focal infiltrates. The bony thorax is intact. IMPRESSION: Mild stable cardiac enlargement with mild central vascular congestion. No infiltrates, edema or effusions. Peribronchial thickening may suggest bronchitis. Electronically Signed   By: Marijo Sanes M.D.   On: 03/11/2018 13:33   Ct Angio Chest Pe W And/or Wo Contrast  Result Date: 03/12/2018 CLINICAL DATA:  Positive D-dimer with fatigue and dyspnea. History of breast cancer. EXAM: CT ANGIOGRAPHY CHEST WITH CONTRAST TECHNIQUE: Multidetector CT imaging of the chest was performed using the standard protocol during bolus administration of intravenous contrast. Multiplanar CT image reconstructions and MIPs were obtained to evaluate the vascular anatomy. CONTRAST:  59 mL ISOVUE-370 IOPAMIDOL (ISOVUE-370) INJECTION 76% COMPARISON:  12/04/2015 FINDINGS: Cardiovascular: Segmental and subsegmental filling defects to both lower lobes consistent with nonocclusive pulmonary emboli. No right heart strain with RV/LV ratio 0.63. Cardiomegaly without pericardial effusion. Prominent epicardial and mediastinal fat. Conventional branch pattern of the great vessels. Nonaneurysmal thoracic aorta. Mediastinum/Nodes: No enlarged mediastinal, hilar, or axillary lymph nodes. Thyroid gland, trachea, and esophagus demonstrate no significant findings. Lungs/Pleura: Faint diffuse ground-glass opacities likely due to hypoventilatory change. Alveolitis or pneumonitis or stigmata of mild CHF are among some other possibilities though favor hypoventilatory change. There is dependent bibasilar atelectasis noted as well. Upper Abdomen: No acute abnormality. Musculoskeletal: No chest wall abnormality. No acute or significant osseous findings. Review of the MIP images confirms the above findings. IMPRESSION: Positive for acute PE to the bilateral lower lobe subsegmental and segmental pulmonary arteries without right heart strain. These results  were called by telephone at the time of interpretation on 03/12/2018 at 2:13 am to Dr. Pryor Curia , who verbally acknowledged these results. Electronically Signed   By: Ashley Royalty M.D.   On: 03/12/2018 02:13   Vas [place] Lower Extremity Venous (dvt)  Result Date: 03/12/2018  Lower Venous Study  Risk Factors: Confirmed PE. Limitations: Body habitus. Performing Technologist: June Leap RDMS, RVT  Examination Guidelines: A complete evaluation includes B-mode imaging, spectral Doppler, color Doppler, and power Doppler as needed of all accessible portions of each vessel. Bilateral testing is considered an integral part of a complete examination. Limited examinations for reoccurring indications may be performed as noted.  Right Venous Findings: +---------+---------------+---------+-----------+----------+--------------+          CompressibilityPhasicitySpontaneityPropertiesSummary        +---------+---------------+---------+-----------+----------+--------------+ CFV      Full           Yes      Yes                                 +---------+---------------+---------+-----------+----------+--------------+ SFJ      Full                                                        +---------+---------------+---------+-----------+----------+--------------+ FV Prox  Full                                                        +---------+---------------+---------+-----------+----------+--------------+ FV Mid   Full                                                        +---------+---------------+---------+-----------+----------+--------------+ FV DistalFull                                                        +---------+---------------+---------+-----------+----------+--------------+ PFV                                                   Not visualized +---------+---------------+---------+-----------+----------+--------------+ POP      Full           Yes      Yes                                 +---------+---------------+---------+-----------+----------+--------------+ PTV                                                   Not visualized +---------+---------------+---------+-----------+----------+--------------+  PERO                                                   Not visualized +---------+---------------+---------+-----------+----------+--------------+  Left Venous Findings: +---------+---------------+---------+-----------+----------+--------------+          CompressibilityPhasicitySpontaneityPropertiesSummary        +---------+---------------+---------+-----------+----------+--------------+ CFV      Full           Yes      Yes                                 +---------+---------------+---------+-----------+----------+--------------+ SFJ      Full                                                        +---------+---------------+---------+-----------+----------+--------------+ FV Prox  Full                                                        +---------+---------------+---------+-----------+----------+--------------+ FV Mid                  Yes      Yes                                 +---------+---------------+---------+-----------+----------+--------------+ FV Distal                                             Not visualized +---------+---------------+---------+-----------+----------+--------------+ PFV                                                   Not visualized +---------+---------------+---------+-----------+----------+--------------+ POP      Full           Yes      Yes                                 +---------+---------------+---------+-----------+----------+--------------+ PTV                                                   Not visualized +---------+---------------+---------+-----------+----------+--------------+ PERO                                                  Not visualized +---------+---------------+---------+-----------+----------+--------------+    Summary: Right: There is no evidence of deep vein thrombosis in the lower extremity.  However, portions of this examination were limited- see technologist comments above. Left: There is no evidence of deep vein thrombosis in the  lower extremity. However, portions of this examination were limited- see technologist comments above.  *See table(s) above for measurements and observations. Electronically signed by Servando Snare MD on 03/12/2018 at 2:12:21 PM.    Final      PERTINENT LAB RESULTS: CBC: Recent Labs    03/11/18 1239 03/13/18 0445  WBC 7.9 7.0  HGB 13.2 12.5  HCT 43.7 40.2  PLT 193 159   CMET CMP     Component Value Date/Time   NA 135 03/13/2018 0445   NA 142 06/25/2013 1037   K 3.9 03/13/2018 0445   CL 102 03/13/2018 0445   CO2 21 (L) 03/13/2018 0445   GLUCOSE 245 (H) 03/13/2018 0445   BUN 17 03/13/2018 0445   BUN 14 06/25/2013 1037   CREATININE 1.12 (H) 03/13/2018 0445   CREATININE 0.93 02/01/2012 1317   CALCIUM 8.4 (L) 03/13/2018 0445   PROT 7.2 09/05/2017 0500   PROT 7.3 06/25/2013 1037   ALBUMIN 3.8 09/05/2017 0500   ALBUMIN 4.5 06/25/2013 1037   AST 35 09/05/2017 0500   ALT 21 09/05/2017 0500   ALKPHOS 85 09/05/2017 0500   BILITOT 1.1 09/05/2017 0500   GFRNONAA 56 (L) 03/13/2018 0445   GFRAA >60 03/13/2018 0445    GFR Estimated Creatinine Clearance: 104 mL/min (A) (by C-G formula based on SCr of 1.12 mg/dL (H)). No results for input(s): LIPASE, AMYLASE in the last 72 hours. Recent Labs    03/12/18 0622 03/12/18 1116 03/12/18 1542  TROPONINI <0.03 <0.03 <0.03   Invalid input(s): POCBNP Recent Labs    03/12/18 0020  DDIMER 0.54*   No results for input(s): HGBA1C in the last 72 hours. No results for input(s): CHOL, HDL, LDLCALC, TRIG, CHOLHDL, LDLDIRECT in the last 72 hours. Recent Labs    03/12/18 0622  TSH 4.332   No results for input(s): VITAMINB12, FOLATE, FERRITIN, TIBC, IRON, RETICCTPCT in the last 72 hours. Coags: Recent Labs    03/13/18 0445  INR 1.12   Microbiology: Recent Results (from the past 240 hour(s))  Urine culture     Status: Abnormal (Preliminary result)   Collection Time: 03/12/18 12:11 AM  Result Value Ref Range Status   Specimen  Description URINE, RANDOM  Final   Special Requests NONE  Final   Culture (A)  Final    >=100,000 COLONIES/mL ESCHERICHIA COLI CULTURE REINCUBATED FOR BETTER GROWTH Performed at [facility], [address] [place], [place] [address]    Report Status PENDING  Incomplete    FURTHER DISCHARGE INSTRUCTIONS:  Get Medicines reviewed and adjusted: Please take all your medications with you for your next visit with your Primary MD  Laboratory/radiological data: Please request your Primary MD to go over all hospital tests and procedure/radiological results at the follow up, please ask your Primary MD to get all Hospital records sent to his/her office.  In some cases, they will be blood work, cultures and biopsy results pending at the time of your discharge. Please request that your primary care M.D. goes through all the records of your hospital data and follows up on these results.  Also Note the following: If you experience worsening of your admission symptoms, develop shortness of breath, life threatening emergency, suicidal or homicidal thoughts you must seek medical attention immediately by calling 911 or calling your MD immediately  if symptoms less severe.  You  must read complete instructions/literature along with all the possible adverse reactions/side effects for all the Medicines you take and that have been prescribed to you. Take any new Medicines after you have completely understood and accpet all the possible adverse reactions/side effects.   Do not drive when taking Pain medications or sleeping medications (Benzodaizepines)  Do not take more than prescribed Pain, Sleep and Anxiety Medications. It is not advisable to combine anxiety,sleep and pain medications without talking with your primary care practitioner  Special Instructions: If you have smoked or chewed Tobacco  in the last 2 yrs please stop smoking, stop any regular Alcohol  and or any Recreational drug use.  Wear  Seat belts while driving.  Please note: You were cared for by a hospitalist during your hospital stay. Once you are discharged, your primary care physician will handle any further medical issues. Please note that NO REFILLS for any discharge medications will be authorized once you are discharged, as it is imperative that you return to your primary care physician (or establish a relationship with a primary care physician if you do not have one) for your post hospital discharge needs so that they can reassess your need for medications and monitor your lab values.  Total Time spent coordinating discharge including counseling, education and face to face time equals 35 minutes.  SignedOren Binet 03/13/2018 10:46 AM

## 2018-03-13 NOTE — Plan of Care (Signed)

## 2018-03-13 NOTE — Progress Notes (Signed)
Discharge instructions (including medications) discussed with and copy provided to patient/caregiver 

## 2018-03-13 NOTE — Progress Notes (Addendum)
  POST HOSPITAL F/U AND MANAGEMENT OF COUMADIN/INR TX:  [facility]     [phone]     Next Steps: Go on 03/14/2018    Instructions: 11:40 am, Dr. Jeralene Huff     Noted on AVS and pt made aware per NCM. Whitman Hero RN,BSN,CM

## 2018-03-13 NOTE — Progress Notes (Signed)
FEELS MUCH BETTER-AMBULATED IN THE ROOM YESTERDAY-WITHOUT ANY SOB/CP STABLE FOR DISCHARGE TODAY-SEE D/C SUMMARY FOR DETAILS

## 2018-03-14 LAB — URINE CULTURE: Culture: >=100000 — AB

## 2018-03-28 DIAGNOSIS — Z7901 Long term (current) use of anticoagulants: Secondary | ICD-10-CM | POA: Insufficient documentation

## 2018-05-21 ENCOUNTER — Other Ambulatory Visit: Payer: Self-pay | Admitting: Family Medicine

## 2018-06-19 ENCOUNTER — Encounter (HOSPITAL_COMMUNITY): Payer: Medicare Other

## 2018-06-19 ENCOUNTER — Encounter (HOSPITAL_COMMUNITY): Payer: BLUE CROSS/BLUE SHIELD

## 2018-06-19 ENCOUNTER — Encounter (HOSPITAL_COMMUNITY): Payer: Self-pay | Admitting: *Deleted

## 2018-06-19 ENCOUNTER — Inpatient Hospital Stay (HOSPITAL_COMMUNITY)
Admission: EM | Admit: 2018-06-19 | Discharge: 2018-06-22 | DRG: 872 | Disposition: A | Discharge status: Home / Self Care | Payer: Medicare Other | Attending: Internal Medicine | Admitting: Internal Medicine

## 2018-06-19 ENCOUNTER — Emergency Department (HOSPITAL_COMMUNITY): Payer: Medicare Other

## 2018-06-19 ENCOUNTER — Other Ambulatory Visit: Payer: Self-pay

## 2018-06-19 DIAGNOSIS — J45909 Unspecified asthma, uncomplicated: Secondary | ICD-10-CM | POA: Diagnosis present

## 2018-06-19 DIAGNOSIS — I2699 Other pulmonary embolism without acute cor pulmonale: Secondary | ICD-10-CM | POA: Diagnosis present

## 2018-06-19 DIAGNOSIS — R609 Edema, unspecified: Secondary | ICD-10-CM | POA: Diagnosis not present

## 2018-06-19 DIAGNOSIS — E1169 Type 2 diabetes mellitus with other specified complication: Secondary | ICD-10-CM | POA: Diagnosis present

## 2018-06-19 DIAGNOSIS — E669 Obesity, unspecified: Secondary | ICD-10-CM | POA: Diagnosis present

## 2018-06-19 DIAGNOSIS — Z79899 Other long term (current) drug therapy: Secondary | ICD-10-CM

## 2018-06-19 DIAGNOSIS — A419 Sepsis, unspecified organism: Principal | ICD-10-CM | POA: Diagnosis present

## 2018-06-19 DIAGNOSIS — Z20828 Contact with and (suspected) exposure to other viral communicable diseases: Secondary | ICD-10-CM | POA: Diagnosis present

## 2018-06-19 DIAGNOSIS — M7989 Other specified soft tissue disorders: Secondary | ICD-10-CM | POA: Diagnosis not present

## 2018-06-19 DIAGNOSIS — Z87891 Personal history of nicotine dependence: Secondary | ICD-10-CM

## 2018-06-19 DIAGNOSIS — H9190 Unspecified hearing loss, unspecified ear: Secondary | ICD-10-CM | POA: Diagnosis present

## 2018-06-19 DIAGNOSIS — L03116 Cellulitis of left lower limb: Secondary | ICD-10-CM | POA: Diagnosis present

## 2018-06-19 DIAGNOSIS — Z8673 Personal history of transient ischemic attack (TIA), and cerebral infarction without residual deficits: Secondary | ICD-10-CM

## 2018-06-19 DIAGNOSIS — Z90710 Acquired absence of both cervix and uterus: Secondary | ICD-10-CM | POA: Diagnosis not present

## 2018-06-19 DIAGNOSIS — G4733 Obstructive sleep apnea (adult) (pediatric): Secondary | ICD-10-CM | POA: Diagnosis present

## 2018-06-19 DIAGNOSIS — J4520 Mild intermittent asthma, uncomplicated: Secondary | ICD-10-CM | POA: Diagnosis not present

## 2018-06-19 DIAGNOSIS — Z794 Long term (current) use of insulin: Secondary | ICD-10-CM

## 2018-06-19 DIAGNOSIS — F32A Depression, unspecified: Secondary | ICD-10-CM

## 2018-06-19 DIAGNOSIS — E785 Hyperlipidemia, unspecified: Secondary | ICD-10-CM | POA: Diagnosis present

## 2018-06-19 DIAGNOSIS — F419 Anxiety disorder, unspecified: Secondary | ICD-10-CM | POA: Diagnosis present

## 2018-06-19 DIAGNOSIS — Z6841 Body Mass Index (BMI) 40.0 and over, adult: Secondary | ICD-10-CM

## 2018-06-19 DIAGNOSIS — Z7901 Long term (current) use of anticoagulants: Secondary | ICD-10-CM

## 2018-06-19 DIAGNOSIS — M1990 Unspecified osteoarthritis, unspecified site: Secondary | ICD-10-CM | POA: Diagnosis present

## 2018-06-19 DIAGNOSIS — G894 Chronic pain syndrome: Secondary | ICD-10-CM | POA: Diagnosis present

## 2018-06-19 DIAGNOSIS — E039 Hypothyroidism, unspecified: Secondary | ICD-10-CM | POA: Diagnosis present

## 2018-06-19 DIAGNOSIS — M069 Rheumatoid arthritis, unspecified: Secondary | ICD-10-CM | POA: Diagnosis present

## 2018-06-19 DIAGNOSIS — E1142 Type 2 diabetes mellitus with diabetic polyneuropathy: Secondary | ICD-10-CM | POA: Diagnosis not present

## 2018-06-19 DIAGNOSIS — Z86711 Personal history of pulmonary embolism: Secondary | ICD-10-CM | POA: Diagnosis not present

## 2018-06-19 DIAGNOSIS — R509 Fever, unspecified: Secondary | ICD-10-CM | POA: Diagnosis present

## 2018-06-19 DIAGNOSIS — I2782 Chronic pulmonary embolism: Secondary | ICD-10-CM | POA: Diagnosis not present

## 2018-06-19 DIAGNOSIS — Z833 Family history of diabetes mellitus: Secondary | ICD-10-CM

## 2018-06-19 DIAGNOSIS — F329 Major depressive disorder, single episode, unspecified: Secondary | ICD-10-CM | POA: Diagnosis present

## 2018-06-19 DIAGNOSIS — G8929 Other chronic pain: Secondary | ICD-10-CM

## 2018-06-19 LAB — I-STAT BETA HCG BLOOD, ED (MC, WL, AP ONLY): I-stat hCG, quantitative: <5 m[IU]/mL (ref <5)

## 2018-06-19 LAB — COMPREHENSIVE METABOLIC PANEL
ALT: 16 U/L (ref 0–44)
AST: 21 U/L (ref 15–41)
Albumin: 3.8 g/dL (ref 3.5–5.0)
Alkaline Phosphatase: 90 U/L (ref 38–126)
Anion gap: 10 (ref 5–15)
BUN: 12 mg/dL (ref 6–20)
CO2: 25 mmol/L (ref 22–32)
Calcium: 9.3 mg/dL (ref 8.9–10.3)
Chloride: 102 mmol/L (ref 98–111)
Creatinine, Ser: 1.02 mg/dL — ABNORMAL HIGH (ref 0.44–1.00)
GFR calc Af Amer: >60 mL/min (ref >60)
GFR calc non Af Amer: >60 mL/min (ref >60)
Glucose, Bld: 155 mg/dL — ABNORMAL HIGH (ref 70–99)
Potassium: 4.5 mmol/L (ref 3.5–5.1)
Sodium: 137 mmol/L (ref 135–145)
Total Bilirubin: 0.7 mg/dL (ref 0.3–1.2)
Total Protein: 7.3 g/dL (ref 6.5–8.1)

## 2018-06-19 LAB — URINALYSIS, ROUTINE W REFLEX MICROSCOPIC
Bilirubin Urine: NEGATIVE
Glucose, UA: NEGATIVE mg/dL
Hgb urine dipstick: NEGATIVE
Ketones, ur: NEGATIVE mg/dL
Leukocytes,Ua: NEGATIVE
Nitrite: NEGATIVE
Protein, ur: NEGATIVE mg/dL
Specific Gravity, Urine: 1.02 (ref 1.005–1.030)
pH: 7 (ref 5.0–8.0)

## 2018-06-19 LAB — BRAIN NATRIURETIC PEPTIDE: B Natriuretic Peptide: 193.4 pg/mL — ABNORMAL HIGH (ref 0.0–100.0)

## 2018-06-19 LAB — CBC WITH DIFFERENTIAL/PLATELET
Abs Immature Granulocytes: 0.06 10*3/uL (ref 0.00–0.07)
Basophils Absolute: 0 10*3/uL (ref 0.0–0.1)
Basophils Relative: 0 %
Eosinophils Absolute: 0.1 10*3/uL (ref 0.0–0.5)
Eosinophils Relative: 1 %
HCT: 43.5 % (ref 36.0–46.0)
Hemoglobin: 13.2 g/dL (ref 12.0–15.0)
Immature Granulocytes: 1 %
Lymphocytes Relative: 7 %
Lymphs Abs: 0.7 10*3/uL (ref 0.7–4.0)
MCH: 24.1 pg — ABNORMAL LOW (ref 26.0–34.0)
MCHC: 30.3 g/dL (ref 30.0–36.0)
MCV: 79.5 fL — ABNORMAL LOW (ref 80.0–100.0)
Monocytes Absolute: 0.5 10*3/uL (ref 0.1–1.0)
Monocytes Relative: 5 %
Neutro Abs: 8.8 10*3/uL — ABNORMAL HIGH (ref 1.7–7.7)
Neutrophils Relative %: 86 %
Platelets: 197 10*3/uL (ref 150–400)
RBC: 5.47 MIL/uL — ABNORMAL HIGH (ref 3.87–5.11)
RDW: 16.4 % — ABNORMAL HIGH (ref 11.5–15.5)
WBC: 10.2 10*3/uL (ref 4.0–10.5)
nRBC: 0 % (ref 0.0–0.2)

## 2018-06-19 LAB — LACTIC ACID, PLASMA
Lactic Acid, Venous: 1.9 mmol/L (ref 0.5–1.9)
Lactic Acid, Venous: 2.5 mmol/L (ref 0.5–1.9)
Lactic Acid, Venous: 1.5 mmol/L (ref 0.5–1.9)
Lactic Acid, Venous: 1.9 mmol/L (ref 0.5–1.9)

## 2018-06-19 LAB — HEMOGLOBIN A1C
Hgb A1c MFr Bld: 9 % — ABNORMAL HIGH (ref 4.8–5.6)
Mean Plasma Glucose: 211.6 mg/dL

## 2018-06-19 LAB — PROTIME-INR
INR: 1.8 — ABNORMAL HIGH (ref 0.8–1.2)
Prothrombin Time: 20.6 seconds — ABNORMAL HIGH (ref 11.4–15.2)

## 2018-06-19 LAB — GLUCOSE, CAPILLARY: Glucose-Capillary: 152 mg/dL — ABNORMAL HIGH (ref 70–99)

## 2018-06-19 LAB — SARS CORONAVIRUS 2 BY RT PCR (HOSPITAL ORDER, PERFORMED IN ~~LOC~~ HOSPITAL LAB): SARS Coronavirus 2: NEGATIVE

## 2018-06-19 MED ORDER — ACETAMINOPHEN 500 MG PO TABS
1000.0000 mg | ORAL_TABLET | Freq: Once | ORAL | Status: AC
  Administered 2018-06-19: 1000 mg via ORAL
  Filled 2018-06-19: qty 2

## 2018-06-19 MED ORDER — ACETAMINOPHEN 650 MG RE SUPP: 650.0000 mg | Freq: Four times a day (QID) | RECTAL | Status: DC | PRN

## 2018-06-19 MED ORDER — GABAPENTIN 300 MG PO CAPS
300.0000 mg | ORAL_CAPSULE | Freq: Two times a day (BID) | ORAL | Status: DC
  Administered 2018-06-19 – 2018-06-22 (×6): 300 mg via ORAL
  Filled 2018-06-19 (×6): qty 1

## 2018-06-19 MED ORDER — CYCLOBENZAPRINE HCL 5 MG PO TABS: 5.0000 mg | ORAL_TABLET | Freq: Once | ORAL | Status: DC

## 2018-06-19 MED ORDER — ALPRAZOLAM 0.5 MG PO TABS
1.0000 mg | ORAL_TABLET | Freq: Three times a day (TID) | ORAL | Status: DC | PRN
  Administered 2018-06-22: 1 mg via ORAL
  Filled 2018-06-19: qty 2

## 2018-06-19 MED ORDER — SODIUM CHLORIDE 0.9 % IV SOLN
3.0000 g | Freq: Once | INTRAVENOUS | Status: AC
  Administered 2018-06-19: 3 g via INTRAVENOUS
  Filled 2018-06-19: qty 3

## 2018-06-19 MED ORDER — RIVAROXABAN 20 MG PO TABS
20.0000 mg | ORAL_TABLET | Freq: Every day | ORAL | Status: DC
  Administered 2018-06-20 – 2018-06-22 (×3): 20 mg via ORAL
  Filled 2018-06-19 (×3): qty 1

## 2018-06-19 MED ORDER — BUPRENORPHINE 15 MCG/HR TD PTWK: 1.0000 | MEDICATED_PATCH | TRANSDERMAL | Status: DC

## 2018-06-19 MED ORDER — SODIUM CHLORIDE 0.9 % IV BOLUS
1000.0000 mL | Freq: Once | INTRAVENOUS | Status: AC
  Administered 2018-06-19: 1000 mL via INTRAVENOUS

## 2018-06-19 MED ORDER — ACETAMINOPHEN 325 MG PO TABS
650.0000 mg | ORAL_TABLET | Freq: Four times a day (QID) | ORAL | Status: DC | PRN
  Administered 2018-06-20 – 2018-06-22 (×3): 650 mg via ORAL
  Filled 2018-06-19 (×3): qty 2

## 2018-06-19 MED ORDER — INSULIN ASPART 100 UNIT/ML ~~LOC~~ SOLN
0.0000 [IU] | Freq: Every day | SUBCUTANEOUS | Status: DC
  Administered 2018-06-20 – 2018-06-21 (×2): 2 [IU] via SUBCUTANEOUS

## 2018-06-19 MED ORDER — SODIUM CHLORIDE 0.9 % IV SOLN: 1.5000 g | Freq: Once | INTRAVENOUS | Status: DC

## 2018-06-19 MED ORDER — SODIUM CHLORIDE 0.9% FLUSH: 3.0000 mL | Freq: Once | INTRAVENOUS | Status: DC

## 2018-06-19 MED ORDER — INSULIN ASPART 100 UNIT/ML ~~LOC~~ SOLN
0.0000 [IU] | Freq: Three times a day (TID) | SUBCUTANEOUS | Status: DC
  Administered 2018-06-20 (×2): 4 [IU] via SUBCUTANEOUS
  Administered 2018-06-20 – 2018-06-21 (×2): 3 [IU] via SUBCUTANEOUS
  Administered 2018-06-21: 4 [IU] via SUBCUTANEOUS
  Administered 2018-06-21 – 2018-06-22 (×3): 7 [IU] via SUBCUTANEOUS

## 2018-06-19 MED ORDER — ALPRAZOLAM 0.5 MG PO TABS
1.0000 mg | ORAL_TABLET | Freq: Three times a day (TID) | ORAL | Status: DC
  Administered 2018-06-19: 1 mg via ORAL
  Filled 2018-06-19: qty 2

## 2018-06-19 MED ORDER — SODIUM CHLORIDE 0.9 % IV BOLUS: 1000.0000 mL | Freq: Once | INTRAVENOUS | Status: DC

## 2018-06-19 MED ORDER — SODIUM CHLORIDE 0.9 % IV SOLN
1.0000 g | INTRAVENOUS | Status: DC
  Administered 2018-06-19 – 2018-06-21 (×3): 1 g via INTRAVENOUS
  Filled 2018-06-19 (×4): qty 10

## 2018-06-19 MED ORDER — INSULIN GLARGINE 100 UNIT/ML ~~LOC~~ SOLN
100.0000 [IU] | Freq: Every day | SUBCUTANEOUS | Status: DC
  Administered 2018-06-19 – 2018-06-21 (×3): 100 [IU] via SUBCUTANEOUS
  Filled 2018-06-19 (×4): qty 1

## 2018-06-19 MED ORDER — SERTRALINE HCL 100 MG PO TABS
200.0000 mg | ORAL_TABLET | Freq: Every day | ORAL | Status: DC
  Administered 2018-06-19 – 2018-06-21 (×3): 200 mg via ORAL
  Filled 2018-06-19 (×3): qty 2

## 2018-06-19 MED ORDER — LEVOTHYROXINE SODIUM 75 MCG PO TABS
75.0000 ug | ORAL_TABLET | Freq: Every day | ORAL | Status: DC
  Administered 2018-06-20 – 2018-06-22 (×3): 75 ug via ORAL
  Filled 2018-06-19 (×3): qty 1

## 2018-06-19 MED ORDER — ONDANSETRON HCL 4 MG PO TABS: 4.0000 mg | ORAL_TABLET | Freq: Four times a day (QID) | ORAL | Status: DC | PRN

## 2018-06-19 MED ORDER — ALBUTEROL SULFATE (2.5 MG/3ML) 0.083% IN NEBU: 2.5000 mg | INHALATION_SOLUTION | RESPIRATORY_TRACT | Status: DC | PRN

## 2018-06-19 MED ORDER — ONDANSETRON HCL 4 MG/2ML IJ SOLN: 4.0000 mg | Freq: Four times a day (QID) | INTRAMUSCULAR | Status: DC | PRN

## 2018-06-19 MED ORDER — SODIUM CHLORIDE 0.9 % IV SOLN: 1.0000 g | INTRAVENOUS | Status: DC

## 2018-06-19 MED ORDER — HYDROMORPHONE HCL 1 MG/ML IJ SOLN
1.0000 mg | Freq: Once | INTRAMUSCULAR | Status: AC
  Administered 2018-06-19: 1 mg via INTRAVENOUS
  Filled 2018-06-19: qty 1

## 2018-06-19 MED ORDER — FENTANYL CITRATE (PF) 100 MCG/2ML IJ SOLN
100.0000 ug | Freq: Once | INTRAMUSCULAR | Status: AC
  Administered 2018-06-19: 100 ug via INTRAVENOUS
  Filled 2018-06-19: qty 2

## 2018-06-19 NOTE — ED Notes (Signed)
Pt refused rectal temp.

## 2018-06-19 NOTE — ED Notes (Signed)
Date and time results received: 06/19/18 1750  (use smartphrase ".now" to insert current time)  Test: lactic Critical Value: 2.5  Name of Provider Notified: pickering Orders Received? Or Actions Taken?: .

## 2018-06-19 NOTE — Progress Notes (Signed)
RT brought CPAP to patients room.  Patient would like to eat first.  RT instructed patient to have RN call when she is ready to be put on CPAP.  Patient showed understanding.

## 2018-06-19 NOTE — Progress Notes (Addendum)
Pt presented to the floor alert & oriented times 4. An airbed was ordered for the pt. Pt has had a fall in the last six months, pt was educated on the floor and the call bell system. Pt stated that she understood that she is not allowed to get up with out contacting the nurses station by using the call bell and getting some one to help her. Call bell and phone was place within the pt's reach. A fall bracelet was placed on the pt right arm. Pt was experiencing some pain 7/10, at which the doctor was contacted about. Due to the pt having a buprenorphine patch on, the only pain relief that could be offered was a flexeril. Pt declined the Flexeril stating it makes her "loopy". Pt stated that her buttock was really sore from being in the ED on the bed for so long. A foam dressing was placed on her buttocks for preventive measures. The pt had no skin issues to report, checked by the required two nurses.  Pt's bilateral lower extremities were swollen (left more then right), and warm to touch. Pt stated that she does have a CPAP at home however she does not wear it.   Home meds were counted with the pt at the bed side and taking to the pharmacy. Information was placed in pt file, ID number [id number] Xarelto  AZO One Daily  Methocarbamol-13 Sertraline  Gabapentin  [place] Sodium  Levothyroxine  Furosemide

## 2018-06-19 NOTE — ED Provider Notes (Signed)
[facility] EMERGENCY DEPARTMENT Provider Note   CSN: [id number] Arrival date & time: 06/19/18  1414    History   Chief Complaint Chief Complaint  Patient presents with  . Fever    HPI Cheyenne Guzman is a 53 y.o. female with history of anxiety, arthritis, asthma, breast cancer, diabetes mellitus, obesity, hyperlipidemia, urosepsis, TIA, rheumatoid arthritis presents for evaluation of acute onset, progressively worsening fever and left lower leg pain.  She reports that she awoke this morning with generalized myalgias and as the day went on she had a progressively increasing temperature with T-max 102.8 F.  She took some pain medicine which contained Tylenol and it with some improvement in her fever.  She reports that for the last several days she has had progressively worsening pain and swelling primarily to the left calf.  She has erythema and swelling to the lower extremities bilaterally at baseline however she noticed that symptoms worsened after she sustained a puncture wound from a dog scratch 1 week ago.  Notes nausea but no vomiting.  Denies abdominal pain.  She reports shortness of breath at baseline for her secondary to her morbid obesity denies cough.  No recent travel or any known sick contacts.  She called her PCP who recommended presentation to the ED for evaluation.  Of note, patient recently diagnosed with PE on 03/12/2018, currently on Xarelto, to which she reports compliance.     The history is provided by the patient.    Past Medical History:  Diagnosis Date  . Anxiety    per pt. - mild   . Arthritis    back & ankles   . Asthma    seasonal   . Cancer [facility])    breast, sees Dr. Jana Hakim , Left - 11/2007  . Complication of anesthesia 2013   severe muscle spasms-sore-no doc  . Depression   . Diabetes mellitus    sees Dr. Chalmers Cater, diagnosed 2003  . HA (headache)   . Hearing loss    Went to [facility]  .  Hyperlipidemia   . Kidney infection    - hosp. [facility]- septic- 10/2009  . Low back pain   . Peripheral neuropathy    R sided numbness- face & jaw  . Sleep apnea 9/15   severe-just started cpap-doing well  . TIA (transient ischemic attack)    in 2007,2008, and 2011  . Tinnitus of right ear   . Urosepsis 08-2009   with Klebsiella    Patient Active Problem List   Diagnosis Date Noted  . Pulmonary embolism ([place]) 03/12/2018  . Diabetes mellitus type 2 in obese ([place]) 03/12/2018  . Acute pulmonary embolism ([place]) 03/12/2018  . Cellulitis of right leg 09/05/2017  . Acute viral syndrome 09/05/2017  . Hypothyroidism 08/14/2017  . Edema of both legs 10/18/2015  . Migraine 06/16/2014  . Lumbar radiculopathy 03/18/2014  . Diabetic neuropathy ([place]) 01/07/2014  . Congenital spondylolysis of lumbosacral region 11/02/2013  . SPL (spondylolisthesis) 10/08/2013  . Chronic LBP 10/08/2013  . Obstructive sleep apnea 07/31/2013  . HA (headache)   . Tinnitus 06/23/2013  . Hearing loss 06/23/2013  . Headache(784.0) 06/23/2013  . Low back pain 06/10/2013  . SOB (shortness of breath) 11/27/2012  . Chest pain 11/27/2012  . Cellulitis of left leg 07/03/2012  . Morbid obesity ([place]) 07/03/2012  . Abdominal pain 02/01/2012  . Breast mass, right 07/13/2011  . DCIS (ductal carcinoma in situ) of breast, left, treated  with [facility] 11/2007 02/19/2011  . TIA 10/13/2009  . BACK PAIN, LUMBAR 10/13/2009  . PYELONEPHRITIS 09/09/2009  . Urinary tract infection, site not specified 08/16/2008  . Asthma 02/20/2008  . CANDIDIASIS 08/28/2007  . Diabetes mellitus without complication ([place]) 63/78/5885  . HYPERLIPIDEMIA 11/26/2006  . Anxiety state 11/26/2006  . DEPRESSION 11/26/2006  . ACUTE BRONCHITIS 11/26/2006    Past Surgical History:  Procedure Laterality Date  . ABDOMINAL HYSTERECTOMY  08/2004  . BREAST BIOPSY  06/22/2011   Procedure: BREAST BIOPSY;  Surgeon: Stark Klein, MD;  Location: [facility];  Service:  General;  Laterality: Right;  . BREAST SURGERY  11/2007   left ductal carcinoma in situ  . CARPAL TUNNEL RELEASE Right 10/13/2013   Procedure: RIGHT CARPAL TUNNEL RELEASE;  Surgeon: Hessie Dibble, MD;  Location: [facility];  Service: Orthopedics;  Laterality: Right;  . CARPAL TUNNEL RELEASE Left 11/24/2013   Procedure: LEFT CARPAL TUNNEL RELEASE WITH CYST EXCISION ;  Surgeon: Hessie Dibble, MD;  Location: [facility];  Service: Orthopedics;  Laterality: Left;  . EAR CYST EXCISION Left 11/24/2013   Procedure: CYST REMOVAL;  Surgeon: Hessie Dibble, MD;  Location: [facility];  Service: Orthopedics;  Laterality: Left;  . FOOT SURGERY  2000   plantar fasciitis-left  . LUMBAR DISC SURGERY  03-29-14   Fusion, revision 04-27-14 L4-5 [place]a Equina with graft  . NERVE GRAFT  06-01-08   cadaver graft to right inferior alveolar nerve  in [place]     OB History   No obstetric history on file.      Home Medications    Prior to Admission medications   Medication Sig Start Date End Date Taking? Authorizing Provider  albuterol (PROVENTIL HFA;VENTOLIN HFA) 108 (90 Base) MCG/ACT inhaler Inhale 2 puffs into the lungs every 4 (four) hours as needed for wheezing or shortness of breath. 04/03/17  Yes Laurey Morale, MD  albuterol (PROVENTIL) (2.5 MG/3ML) 0.083% nebulizer solution 1 VIAL IN NEBULIZER EVERY 4 HOURS AS NEEDED FOR WHEEZING Patient taking differently: Take 2.5 mg by nebulization every 4 (four) hours as needed for wheezing.  10/13/13  Yes Laurey Morale, MD  ALPRAZolam Duanne Moron) 1 MG tablet TAKE 1 TABLET BY MOUTH THREE TIMES A DAY Patient taking differently: Take 1 mg by mouth 3 (three) times daily.  03/06/18  Yes Laurey Morale, MD  furosemide (LASIX) 40 MG tablet TAKE 1 TABLET (40 MG TOTAL) BY MOUTH DAILY. AS NEEDED FOR SWELLING. Patient taking differently: Take 40 mg by mouth daily.  02/28/16  Yes Laurey Morale, MD  Oxycodone HCl 10 MG TABS Take 10  mg by mouth daily as needed (breakthrough pain).    Yes [provider]  rivaroxaban (XARELTO) 20 MG TABS tablet Take 20 mg by mouth daily. 05/22/18  Yes [provider]  Blood Glucose Monitoring Suppl (CONTOUR NEXT MONITOR) w/Device KIT 1 each by Does not apply route 2 (two) times daily. To check blood sugars twice a day. 08/27/16   Renato Shin, MD  Buprenorphine 15 MCG/HR PTWK Place 1 patch onto the skin every Tuesday.  07/23/17   [provider]  cephALEXin (KEFLEX) 500 MG capsule Take 1 capsule (500 mg total) by mouth 3 (three) times daily. 03/14/18   Ghimire, Henreitta Leber, MD  gabapentin (NEURONTIN) 300 MG capsule Take 300 mg by mouth 2 (two) times daily.     [provider]  glucose blood (CONTOUR NEXT TEST) test strip  Use to test blood sugar 2 times daily 12/18/16   Renato Shin, MD  glucose blood test strip Use as instructed 08/24/16   Renato Shin, MD  Insulin Glargine (LANTUS SOLOSTAR) 100 UNIT/ML Solostar Pen Inject 280 Units into the skin every morning. and pen needles 3/day 10/16/17   Renato Shin, MD  levothyroxine (SYNTHROID, LEVOTHROID) 75 MCG tablet Take 1 tablet (75 mcg total) by mouth daily. 05/28/17   Laurey Morale, MD  methocarbamol (ROBAXIN) 750 MG tablet Take 1 tablet (750 mg total) by mouth 2 (two) times daily. Patient taking differently: Take 750 mg by mouth 3 (three) times daily.  02/03/15   Laurey Morale, MD  sertraline (ZOLOFT) 100 MG tablet TAKE 2 TABLETS (200 MG TOTAL) BY MOUTH AT BEDTIME. Patient taking differently: Take 200 mg by mouth daily.  05/15/17   Laurey Morale, MD  warfarin (COUMADIN) 5 MG tablet Take 2 tablets (10 mg total) by mouth daily at 6 PM. 03/13/18 03/13/19  Ghimire, Henreitta Leber, MD    Family History Family History  Problem Relation Age of Onset  . Cancer Mother        breast  . Cancer Father        lung  . Diabetes Father   . Cancer Sister        colon  . Cancer Maternal Aunt        breast - both  . Diabetes Sister    . Anesthesia problems Neg Hx     Social History Social History   Tobacco Use  . Smoking status: Former Smoker    Last attempt to quit: 02/18/1997    Years since quitting: 21.3  . Smokeless tobacco: Never Used  Substance Use Topics  . Alcohol use: Yes    Comment: rare  . Drug use: No     Allergies   Vicodin [hydrocodone-acetaminophen]   Review of Systems Review of Systems  Constitutional: Positive for chills and fever.  Respiratory: Negative for cough and shortness of breath.   Cardiovascular: Negative for chest pain.  Gastrointestinal: Positive for nausea. Negative for abdominal pain and vomiting.  Musculoskeletal: Positive for myalgias.  Skin: Positive for color change and wound.  All other systems reviewed and are negative.    Physical Exam Updated Vital Signs BP (!) 103/50   Pulse (!) 114   Temp (!) 102.5 F (39.2 C) (Oral)   Resp 16   SpO2 90%   Physical Exam Vitals signs and nursing note reviewed.  Constitutional:      General: She is not in acute distress.    Appearance: She is well-developed. She is obese.  HENT:     Head: Normocephalic and atraumatic.  Eyes:     General:        Right eye: No discharge.        Left eye: No discharge.     Conjunctiva/sclera: Conjunctivae normal.  Neck:     Musculoskeletal: Normal range of motion and neck supple. No neck rigidity or muscular tenderness.     Vascular: No JVD.     Trachea: No tracheal deviation.  Cardiovascular:     Rate and Rhythm: Regular rhythm. Tachycardia present.     Pulses: Normal pulses.     Comments: 2+ radial and DP/PT pulses bilaterally Pulmonary:     Effort: Pulmonary effort is normal.     Comments: Tachypneic, speaking in full sentences without difficulty.  Globally diminished breath sounds but examination is limited due to body habitus Abdominal:  General: There is no distension.  Musculoskeletal:        General: Swelling and tenderness present.     Right lower leg: Edema  present.     Left lower leg: Edema present.     Comments: Bilateral lower extremity edema, left worse than right.  There is also bilateral lower extremity erythema from the knee to the toes, also worse on the left compared to the right.  She has a superficial puncture wound to the anterior aspect of the left lower leg weeping serous fluid.  Left lower extremity exquisitely tender to palpation particularly around the calves, Homans sign present on the left.  Skin:    General: Skin is warm and dry.     Findings: Erythema present.  Neurological:     General: No focal deficit present.     Mental Status: She is alert.  Psychiatric:        Behavior: Behavior normal.      ED Treatments / Results  Labs (all labs ordered are listed, but only abnormal results are displayed) Labs Reviewed  COMPREHENSIVE METABOLIC PANEL - Abnormal; Notable for the following components:      Result Value   Glucose, Bld 155 (*)    Creatinine, Ser 1.02 (*)    All other components within normal limits  LACTIC ACID, PLASMA - Abnormal; Notable for the following components:   Lactic Acid, Venous 2.5 (*)    All other components within normal limits  CBC WITH DIFFERENTIAL/PLATELET - Abnormal; Notable for the following components:   RBC 5.47 (*)    MCV 79.5 (*)    MCH 24.1 (*)    RDW 16.4 (*)    Neutro Abs 8.8 (*)    All other components within normal limits  PROTIME-INR - Abnormal; Notable for the following components:   Prothrombin Time 20.6 (*)    INR 1.8 (*)    All other components within normal limits  BRAIN NATRIURETIC PEPTIDE - Abnormal; Notable for the following components:   B Natriuretic Peptide 193.4 (*)    All other components within normal limits  SARS CORONAVIRUS 2 (HOSPITAL ORDER, [facility])  CULTURE, BLOOD (ROUTINE X 2)  CULTURE, BLOOD (ROUTINE X 2)  LACTIC ACID, PLASMA  LACTIC ACID, PLASMA  URINALYSIS, ROUTINE W REFLEX MICROSCOPIC  LACTIC ACID, PLASMA  I-STAT  BETA HCG BLOOD, ED ([facility], [facility], [facility] ONLY)    EKG EKG Interpretation  Date/Time:  Thursday Jun 19 2018 16:09:35 EDT Ventricular Rate:  106 PR Interval:    QRS Duration: 88 QT Interval:  336 QTC Calculation: 447 R Axis:   -38 Text Interpretation:  Sinus tachycardia Left axis deviation Consider anterior infarct Confirmed by Davonna Belling [phone]) on 06/19/2018 5:19:06 PM   Radiology Dg Chest Portable 1 View  Result Date: 06/19/2018 CLINICAL DATA:  Fever and chills.  Leg swelling. EXAM: PORTABLE CHEST 1 VIEW COMPARISON:  CT chest dated March 12, 2018. Chest x-ray dated March 11, 2018. FINDINGS: Stable mild cardiomegaly. Normal mediastinal contours. Normal pulmonary vascularity. No focal consolidation, pleural effusion, or pneumothorax. No acute osseous abnormality. IMPRESSION: No active disease. Electronically Signed   By: Titus Dubin M.D.   On: 06/19/2018 15:42    Procedures Procedures (including critical care time)  Medications Ordered in ED Medications  sodium chloride flush (NS) 0.9 % injection 3 mL (has no administration in time range)  sodium chloride 0.9 % bolus 1,000 mL (has no administration in time range)  sodium chloride 0.9 %  bolus 1,000 mL (0 mLs Intravenous Stopped 06/19/18 1730)  HYDROmorphone (DILAUDID) injection 1 mg (1 mg Intravenous Given 06/19/18 1641)  Ampicillin-Sulbactam (UNASYN) 3 g in sodium chloride 0.9 % 100 mL IVPB (0 g Intravenous Stopped 06/19/18 1925)  acetaminophen (TYLENOL) tablet 1,000 mg (1,000 mg Oral Given 06/19/18 1825)  fentaNYL (SUBLIMAZE) injection 100 mcg (100 mcg Intravenous Given 06/19/18 1924)     Initial Impression / Assessment and Plan / ED Course  I have reviewed the triage vital signs and the nursing notes.  Pertinent labs & imaging results that were available during my care of the patient were reviewed by me and considered in my medical decision making (see chart for details).        Patient presenting for evaluation of  fevers, myalgias, left lower extremity pain swelling erythema and drainage.  She is febrile and tachycardic in the ED.  Initial lactate was within normal limits but repeat lactate elevated and so code sepsis was initiated.  She does have elevation in absolute neutrophil count but her WBC count is within normal limits although on chart review it is elevated compared to her baseline.  Chest x-ray shows no acute cardiopulmonary abnormalities with no edema or consolidation.  Remainder of lab work reviewed by me shows new metabolic derangements, no significant renal insufficiency.  Her BNP is mildly elevated.  Her COVID test is negative.  EKG shows sinus tachycardia.  Due to concern for left lower extremity cellulitis with fever, tachycardia, and elevated lactate code sepsis was initiated and she was started on IV antibiotics.  Also given IV fluids though was not given a 30 cc/kg bolus due to her morbid obesity.  She remains hemodynamically stable on reassessment.  DVT study was ordered, however patient is compliant with her anticoagulation and notes no change in her chronic shortness of breath.  Spoke with Dr. Posey Pronto with [facility] hospitalist service who agrees to assume care of patient and bring her into the hospital for further evaluation and management.  Final Clinical Impressions(s) / ED Diagnoses   Final diagnoses:  Sepsis without acute organ dysfunction, due to unspecified organism [facility])  Cellulitis of left lower extremity    ED Discharge Orders    None       Debroah Baller 06/19/18 1929    Davonna Belling, MD 06/20/18 0003

## 2018-06-19 NOTE — H&P (Signed)
History and Physical    Cheyenne Guzman MHD:[id number] DOB: 12/06/1965 DOA: 06/19/2018  PCP: Laurey Morale, MD  Patient coming from: Home  I have personally briefly reviewed patient's old medical records in [place]  Chief Complaint: Fevers, left lower extremity swelling and erythema  HPI: Cheyenne Guzman is a 53 y.o. female with medical history significant for insulin-dependent type 2 diabetes, pulmonary embolism on Xarelto, hyperlipidemia, hypothyroidism, rheumatoid arthritis, asthma, depression and anxiety, chronic pain syndrome, and OSA who presents the ED with progressively worsening left lower leg pain and erythema over the last several days.  She reports having a small scratch from her dog on her left lower leg about 1 week ago and noticed some yellow-colored discharge from the area.  This morning she developed fever of 102.8 Fahrenheit at home associated with chills, diaphoresis, and myalgias.  She reports swelling of both of her legs, left greater than right.  She has had nausea without emesis.  She denies any chest pain, palpitations, dyspnea, cough, abdominal pain, diarrhea, dysuria.  She was diagnosed with PE in February 2020 for which she is on Xarelto.  She denies any obvious bleeding.  ED Course:  Initial vitals showed BP 133/72, pulse 108, RR 12, temp 99.3 Fahrenheit, SPO2 94% on room air.  She later developed a fever of 102.5 Fahrenheit and was transiently hypotensive in the 90-100/40-50s.  Labs are notable for WBC 10.2, hemoglobin 13.2, platelets 197,000, potassium 4.5, BUN 12, creatinine 1.02, serum glucose 155, BNP 193.4, beta-hCG <5.0, and lactic acid 2.5.  SARS-CoV-2 test was negative.  Blood cultures were obtained and pending.  Urinalysis negative for UTI.  Patient was given 2 L of normal saline and started on IV Unasyn.  Lactic acid improved to 1.9.  Portable chest x-ray was negative for focal consolidation, edema, or effusion.  The hospitalist service was  consulted to admit for further evaluation and management of suspected sepsis secondary to left lower extremity cellulitis.  Review of Systems: All systems reviewed and are negative except as documented in history of present illness above.   Past Medical History:  Diagnosis Date  . Anxiety    per pt. - mild   . Arthritis    back & ankles   . Asthma    seasonal   . [facility])    breast, sees Dr. Jana Hakim , Left - 11/2007  . Complication of anesthesia 2013   severe muscle spasms-sore-no doc  . Depression   . Diabetes mellitus    sees Dr. Chalmers Cater, diagnosed 2003  . HA (headache)   . Hearing loss    Went to [facility]  . Hyperlipidemia   . Kidney infection    - hosp. [facility]- septic- 10/2009  . Low back pain   . Peripheral neuropathy    R sided numbness- face & jaw  . Sleep apnea 9/15   severe-just started cpap-doing well  . TIA (transient ischemic attack)    in 2007,2008, and 2011  . Tinnitus of right ear   . Urosepsis 08-2009   with Klebsiella    Past Surgical History:  Procedure Laterality Date  . ABDOMINAL HYSTERECTOMY  08/2004  . BREAST BIOPSY  06/22/2011   Procedure: BREAST BIOPSY;  Surgeon: Stark Klein, MD;  Location: [place];  Service: General;  Laterality: Right;  . BREAST SURGERY  11/2007   left ductal carcinoma in situ  . CARPAL TUNNEL RELEASE Right 10/13/2013   Procedure: RIGHT CARPAL TUNNEL  RELEASE;  Surgeon: Hessie Dibble, MD;  Location: [place];  Service: Orthopedics;  Laterality: Right;  . CARPAL TUNNEL RELEASE Left 11/24/2013   Procedure: LEFT CARPAL TUNNEL RELEASE WITH CYST EXCISION ;  Surgeon: Hessie Dibble, MD;  Location: [place];  Service: Orthopedics;  Laterality: Left;  . EAR CYST EXCISION Left 11/24/2013   Procedure: CYST REMOVAL;  Surgeon: Hessie Dibble, MD;  Location: [place];  Service: Orthopedics;  Laterality: Left;  . FOOT SURGERY  2000   plantar  fasciitis-left  . LUMBAR DISC SURGERY  03-29-14   Fusion, revision 04-27-14 L4-5 Cauda Equina with graft  . NERVE GRAFT  06-01-08   cadaver graft to right inferior alveolar nerve  in [place]    Social History:  reports that she quit smoking about 21 years ago. She has never used smokeless tobacco. She reports current alcohol use. She reports that she does not use drugs.  Allergies  Allergen Reactions  . Vicodin [Hydrocodone-Acetaminophen] Nausea And Vomiting    Family History  Problem Relation Age of Onset  . Cancer Mother        breast  . Cancer Father        lung  . Diabetes Father   . Cancer Sister        colon  . Cancer Maternal Aunt        breast - both  . Diabetes Sister   . Anesthesia problems Neg Hx      Prior to Admission medications   Medication Sig Start Date End Date Taking? Authorizing Provider  albuterol (PROVENTIL HFA;VENTOLIN HFA) 108 (90 Base) MCG/ACT inhaler Inhale 2 puffs into the lungs every 4 (four) hours as needed for wheezing or shortness of breath. 04/03/17  Yes Laurey Morale, MD  albuterol (PROVENTIL) (2.5 MG/3ML) 0.083% nebulizer solution 1 VIAL IN NEBULIZER EVERY 4 HOURS AS NEEDED FOR WHEEZING Patient taking differently: Take 2.5 mg by nebulization every 4 (four) hours as needed for wheezing.  10/13/13  Yes Laurey Morale, MD  ALPRAZolam Duanne Moron) 1 MG tablet TAKE 1 TABLET BY MOUTH THREE TIMES A DAY Patient taking differently: Take 1 mg by mouth 3 (three) times daily.  03/06/18  Yes Laurey Morale, MD  AZO-CRANBERRY PO Take 2 tablets by mouth daily.   Yes [provider]  Buprenorphine 15 MCG/HR PTWK Place 1 patch onto the skin every Tuesday.  07/23/17  Yes [provider]  furosemide (LASIX) 40 MG tablet TAKE 1 TABLET (40 MG TOTAL) BY MOUTH DAILY. AS NEEDED FOR SWELLING. Patient taking differently: Take 40 mg by mouth daily.  02/28/16  Yes Laurey Morale, MD  gabapentin (NEURONTIN) 300 MG capsule Take 300 mg by mouth 2 (two) times daily.     Yes [provider]  levothyroxine (SYNTHROID, LEVOTHROID) 75 MCG tablet Take 1 tablet (75 mcg total) by mouth daily. 05/28/17  Yes Laurey Morale, MD  methocarbamol (ROBAXIN) 750 MG tablet Take 1 tablet (750 mg total) by mouth 2 (two) times daily. Patient taking differently: Take 750 mg by mouth 3 (three) times daily.  02/03/15  Yes Laurey Morale, MD  Oxycodone HCl 10 MG TABS Take 10 mg by mouth daily as needed (breakthrough pain).    Yes [provider]  rivaroxaban (XARELTO) 20 MG TABS tablet Take 20 mg by mouth daily. 05/22/18  Yes [provider]  sertraline (ZOLOFT) 100 MG tablet TAKE 2 TABLETS (200 MG TOTAL) BY MOUTH  AT BEDTIME. Patient taking differently: Take 200 mg by mouth daily.  05/15/17  Yes Laurey Morale, MD  Blood Glucose Monitoring Suppl (CONTOUR NEXT MONITOR) w/Device KIT 1 each by Does not apply route 2 (two) times daily. To check blood sugars twice a day. 08/27/16   Renato Shin, MD  glucose blood (CONTOUR NEXT TEST) test strip Use to test blood sugar 2 times daily 12/18/16   Renato Shin, MD  glucose blood test strip Use as instructed 08/24/16   Renato Shin, MD  Insulin Glargine (LANTUS SOLOSTAR) 100 UNIT/ML Solostar Pen Inject 280 Units into the skin every morning. and pen needles 3/day Patient taking differently: Inject 285 Units into the skin daily. and pen needles 3/day 10/16/17   Renato Shin, MD  warfarin (COUMADIN) 5 MG tablet Take 2 tablets (10 mg total) by mouth daily at 6 PM. Patient not taking: Reported on 06/19/2018 03/13/18 03/13/19  Jonetta Osgood, MD    Physical Exam: Vitals:   06/19/18 1800 06/19/18 1804 06/19/18 1827 06/19/18 1933  BP: (!) 107/44  (!) 103/50 (!) 91/54  Pulse: (!) 114   (!) 106  Resp: 16   20  Temp:  (!) 102.5 F (39.2 C)  (!) 102.2 F (39 C)  TempSrc:  Oral    SpO2: 90%   94%    Constitutional: Morbidly obese woman resting supine in bed, NAD, calm, comfortable, diaphoretic Eyes: PERRL, lids and  conjunctivae normal ENMT: Mucous membranes are moist. Posterior pharynx clear of any exudate or lesions.Normal dentition.  Neck: normal, supple, no masses. Respiratory: clear to auscultation anteriorly, no wheezing, no crackles. Normal respiratory effort. No accessory muscle use.  Cardiovascular: Regular rate and rhythm, no murmurs / rubs / gallops.  Nonpitting edema both legs.  Abdomen: Obese abdomen, no tenderness, no masses palpated. No hepatosplenomegaly. Bowel sounds positive.  Musculoskeletal: Tender to palpation at the calf of the left lower extremity.  No clubbing / cyanosis. No joint deformity upper and lower extremities. Good ROM, no contractures. Normal muscle tone.  Skin: Erythema left lower extremity with increased warmth to touch, healed punctate lesion anterior lower extremity without active discharge.  RLE also with slight erythema and warmth to touch over less compared to the left side.  Buprenorphine patch in place left upper extremity. Neurologic: CN 2-12 grossly intact. Sensation intact, Strength 5/5 in all 4.  Psychiatric: Normal judgment and insight. Alert and oriented x 3. Normal mood.   Labs on Admission: I have personally reviewed following labs and imaging studies  CBC: Recent Labs  Lab 06/19/18 1432  WBC 10.2  NEUTROABS 8.8*  HGB 13.2  HCT 43.5  MCV 79.5*  PLT 790   Basic Metabolic Panel: Recent Labs  Lab 06/19/18 1432  NA 137  K 4.5  CL 102  CO2 25  GLUCOSE 155*  BUN 12  CREATININE 1.02*  CALCIUM 9.3   GFR: CrCl cannot be calculated (Unknown ideal weight.). Liver Function Tests: Recent Labs  Lab 06/19/18 1432  AST 21  ALT 16  ALKPHOS 90  BILITOT 0.7  PROT 7.3  ALBUMIN 3.8   No results for input(s): LIPASE, AMYLASE in the last 168 hours. No results for input(s): AMMONIA in the last 168 hours. Coagulation Profile: Recent Labs  Lab 06/19/18 1432  INR 1.8*   Cardiac Enzymes: No results for input(s): CKTOTAL, CKMB, CKMBINDEX, TROPONINI  in the last 168 hours. BNP (last 3 results) No results for input(s): PROBNP in the last 8760 hours. HbA1C: No results for input(s):  HGBA1C in the last 72 hours. CBG: No results for input(s): GLUCAP in the last 168 hours. Lipid Profile: No results for input(s): CHOL, HDL, LDLCALC, TRIG, CHOLHDL, LDLDIRECT in the last 72 hours. Thyroid Function Tests: No results for input(s): TSH, T4TOTAL, FREET4, T3FREE, THYROIDAB in the last 72 hours. Anemia Panel: No results for input(s): VITAMINB12, FOLATE, FERRITIN, TIBC, IRON, RETICCTPCT in the last 72 hours. Urine analysis:    Component Value Date/Time   COLORURINE YELLOW 06/19/2018 1915   APPEARANCEUR HAZY (A) 06/19/2018 1915   APPEARANCEUR Clear 07/31/2013 1404   LABSPEC 1.020 06/19/2018 1915   PHURINE 7.0 06/19/2018 1915   GLUCOSEU NEGATIVE 06/19/2018 1915   HGBUR NEGATIVE 06/19/2018 1915   HGBUR negative 09/28/2009 1044   BILIRUBINUR NEGATIVE 06/19/2018 1915   BILIRUBINUR 1+ 10/31/2017 1721   BILIRUBINUR Negative 07/31/2013 [place] 06/19/2018 1915   PROTEINUR NEGATIVE 06/19/2018 1915   UROBILINOGEN 0.2 10/31/2017 1721   UROBILINOGEN 0.2 07/03/2012 1027   NITRITE NEGATIVE 06/19/2018 1915   LEUKOCYTESUR NEGATIVE 06/19/2018 1915    Radiological Exams on Admission: Dg Chest Portable 1 View  Result Date: 06/19/2018 CLINICAL DATA:  Fever and chills.  Leg swelling. EXAM: PORTABLE CHEST 1 VIEW COMPARISON:  CT chest dated March 12, 2018. Chest x-ray dated March 11, 2018. FINDINGS: Stable mild cardiomegaly. Normal mediastinal contours. Normal pulmonary vascularity. No focal consolidation, pleural effusion, or pneumothorax. No acute osseous abnormality. IMPRESSION: No active disease. Electronically Signed   By: Titus Dubin M.D.   On: 06/19/2018 15:42    EKG: Independently reviewed. Sinus tachycardia, rate 106, no acute ischemic changes.  Assessment/Plan Principal Problem:   Sepsis ([place]) Active Problems:    Hyperlipidemia associated with type 2 diabetes mellitus (HCC)   Anxiety and depression   Asthma   Cellulitis of left leg   Obstructive sleep apnea   Hypothyroidism   Pulmonary embolism (HCC)   Diabetes mellitus type 2 in obese (HCC)   Chronic pain  Cheyenne BENETT is a 53 y.o. female with medical history significant for insulin-dependent type 2 diabetes, pulmonary embolism on Xarelto, hyperlipidemia, hypothyroidism, rheumatoid arthritis, asthma, depression and anxiety, chronic pain syndrome, and OSA who is admitted with sepsis from left lower extremity cellulitis.  Sepsis due to cellulitis of the left lower extremity: Patient with fever, transient hypotension, elevated lactic acid suspicious for sepsis secondary to left lower extremity cellulitis after a scratch from her dog. -Switch antibiotics to IV ceftriaxone -Follow-up blood cultures -BP improved with initial fluids, can give additional fluid boluses as needed  Insulin-dependent type 2 diabetes: Hemoglobin A1c 9.0.  She reports taking Lantus 280 units daily. -Reduced Lantus 100 units daily, add resistant SSI and adjust as needed  History of pulmonary embolism: On Xarelto anticoagulation without obvious bleeding.  Will continue current therapy.  Asthma: Chronic and stable without active wheezing or respiratory symptoms.  Hyperlipidemia: Continue as needed albuterol nebulizer.  Hypothyroidism: TSH 4.332 on 03/12/2018. -Continue home Synthroid  Chronic back pain and neuropathy: Follows with pain management, well controlled on transdermal buprenorphine. -Continue weekly buprenorphine patch and gabapentin  Depression and anxiety: Chronic and stable.  Continue home sertraline and alprazolam.  OSA: Admits to not using CPAP regularly.  Willing to try while admitted. -CPAP nightly  DVT prophylaxis: Xarelto  Code Status: Full code, confirmed with patient Family Communication: Patient declined, she will discuss with her  daughter herself Disposition Plan: Pending clinical progress Consults called: None Admission status: Inpatient, patient likely requires greater than 2 midnight length stay  for management of sepsis requiring IV antibiotics and further culture data.   Zada Finders MD [facility]  If 7PM-7AM, please contact night-coverage [email or web address]  06/19/2018, 7:53 PM

## 2018-06-19 NOTE — ED Triage Notes (Signed)
Pt reports fever, headache, chills and swelling to lower legs. Pt reports hx of RA flare ups with similar symptoms. Pt also reports having a dog scratch to her left lower leg.

## 2018-06-20 ENCOUNTER — Inpatient Hospital Stay (HOSPITAL_COMMUNITY): Payer: Medicare Other

## 2018-06-20 DIAGNOSIS — G4733 Obstructive sleep apnea (adult) (pediatric): Secondary | ICD-10-CM

## 2018-06-20 DIAGNOSIS — E039 Hypothyroidism, unspecified: Secondary | ICD-10-CM

## 2018-06-20 DIAGNOSIS — G894 Chronic pain syndrome: Secondary | ICD-10-CM

## 2018-06-20 DIAGNOSIS — M7989 Other specified soft tissue disorders: Secondary | ICD-10-CM

## 2018-06-20 DIAGNOSIS — E785 Hyperlipidemia, unspecified: Secondary | ICD-10-CM

## 2018-06-20 DIAGNOSIS — F419 Anxiety disorder, unspecified: Secondary | ICD-10-CM

## 2018-06-20 DIAGNOSIS — Z6841 Body Mass Index (BMI) 40.0 and over, adult: Secondary | ICD-10-CM

## 2018-06-20 DIAGNOSIS — E1169 Type 2 diabetes mellitus with other specified complication: Secondary | ICD-10-CM

## 2018-06-20 DIAGNOSIS — R609 Edema, unspecified: Secondary | ICD-10-CM

## 2018-06-20 DIAGNOSIS — L03116 Cellulitis of left lower limb: Secondary | ICD-10-CM

## 2018-06-20 DIAGNOSIS — E669 Obesity, unspecified: Secondary | ICD-10-CM

## 2018-06-20 DIAGNOSIS — F329 Major depressive disorder, single episode, unspecified: Secondary | ICD-10-CM

## 2018-06-20 DIAGNOSIS — J4520 Mild intermittent asthma, uncomplicated: Secondary | ICD-10-CM

## 2018-06-20 DIAGNOSIS — I2782 Chronic pulmonary embolism: Secondary | ICD-10-CM

## 2018-06-20 LAB — BASIC METABOLIC PANEL
Anion gap: 9 (ref 5–15)
BUN: 16 mg/dL (ref 6–20)
CO2: 24 mmol/L (ref 22–32)
Calcium: 8.7 mg/dL — ABNORMAL LOW (ref 8.9–10.3)
Chloride: 103 mmol/L (ref 98–111)
Creatinine, Ser: 1.14 mg/dL — ABNORMAL HIGH (ref 0.44–1.00)
GFR calc Af Amer: >60 mL/min (ref >60)
GFR calc non Af Amer: 55 mL/min — ABNORMAL LOW (ref >60)
Glucose, Bld: 183 mg/dL — ABNORMAL HIGH (ref 70–99)
Potassium: 4 mmol/L (ref 3.5–5.1)
Sodium: 136 mmol/L (ref 135–145)

## 2018-06-20 LAB — CBC
HCT: 39.4 % (ref 36.0–46.0)
Hemoglobin: 12.1 g/dL (ref 12.0–15.0)
MCH: 24.2 pg — ABNORMAL LOW (ref 26.0–34.0)
MCHC: 30.7 g/dL (ref 30.0–36.0)
MCV: 79 fL — ABNORMAL LOW (ref 80.0–100.0)
Platelets: 166 10*3/uL (ref 150–400)
RBC: 4.99 MIL/uL (ref 3.87–5.11)
RDW: 16.8 % — ABNORMAL HIGH (ref 11.5–15.5)
WBC: 10.4 10*3/uL (ref 4.0–10.5)
nRBC: 0 % (ref 0.0–0.2)

## 2018-06-20 LAB — GLUCOSE, CAPILLARY
Glucose-Capillary: 141 mg/dL — ABNORMAL HIGH (ref 70–99)
Glucose-Capillary: 165 mg/dL — ABNORMAL HIGH (ref 70–99)
Glucose-Capillary: 170 mg/dL — ABNORMAL HIGH (ref 70–99)
Glucose-Capillary: 247 mg/dL — ABNORMAL HIGH (ref 70–99)

## 2018-06-20 MED ORDER — METHOCARBAMOL 750 MG PO TABS
750.0000 mg | ORAL_TABLET | Freq: Three times a day (TID) | ORAL | Status: DC | PRN
  Administered 2018-06-20 – 2018-06-21 (×2): 750 mg via ORAL
  Filled 2018-06-20 (×4): qty 1

## 2018-06-20 MED ORDER — CYCLOBENZAPRINE HCL 5 MG PO TABS: 5.0000 mg | ORAL_TABLET | Freq: Three times a day (TID) | ORAL | Status: DC | PRN

## 2018-06-20 NOTE — Progress Notes (Signed)
Lower extremity venous has been completed.   Preliminary results in CV Proc.   Abram Sander 06/20/2018 11:05 AM

## 2018-06-20 NOTE — Progress Notes (Signed)
PROGRESS NOTE    Cheyenne Guzman  ZOX:[id number] DOB: February 09, 1965 DOA: 06/19/2018 PCP: Laurey Morale, MD     Brief Narrative:  53 y.o. female with medical history significant for insulin-dependent type 2 diabetes, pulmonary embolism on Xarelto, hyperlipidemia, hypothyroidism, rheumatoid arthritis, asthma, depression and anxiety, chronic pain syndrome, and OSA who presents the ED with progressively worsening left lower leg pain and erythema over the last several days.  She reports having a small scratch from her dog on her left lower leg about 1 week ago and noticed some yellow-colored discharge from the area.  This morning she developed fever of 102.8 Fahrenheit at home associated with chills, diaphoresis, and myalgias.  She reports swelling of both of her legs, left greater than right.  She has had nausea without emesis.  She denies any chest pain, palpitations, dyspnea, cough, abdominal pain, diarrhea, dysuria.  She was diagnosed with PE in February 2020 for which she is on Xarelto.  She denies any obvious bleeding.  ED Course:  Initial vitals showed BP 133/72, pulse 108, RR 12, temp 99.3 Fahrenheit, SPO2 94% on room air.  She later developed a fever of 102.5 Fahrenheit and was transiently hypotensive in the 90-100/40-50s.  Labs are notable for WBC 10.2, hemoglobin 13.2, platelets 197,000, potassium 4.5, BUN 12, creatinine 1.02, serum glucose 155, BNP 193.4, beta-hCG <5.0, and lactic acid 2.5.  SARS-CoV-2 test was negative.  Blood cultures were obtained and pending.  Urinalysis negative for UTI.   Assessment & Plan: 1-Sepsis [facility]) -Patient met sepsis criteria on presentation and appears to be secondary to left lower extremity cellulitis. -Continue current IV antibiotics -Follow cultures -Keep leg elevated -As needed pain medication, IV fluids -Sepsis features resolving -Patient currently afebrile.  2-Hyperlipidemia associated with type 2 diabetes mellitus ([place]) -Continue  statins  3-Anxiety and depression -No suicidal ideation or hallucination -Mood overall stable -Continue home psych medication regimen.  4-Asthma -No wheezing on exam -No using accessory muscles on with acute air movement bilaterally. -Continue as needed albuterol.  5-Cellulitis of left leg -As mentioned above we will continue IV antibiotics -Leg elevation -Follow clinical response. -Still with significant swelling, erythema and tenderness of outpatient. -No evidence of DVT in left lower extremity.  However portion of the examination were limited secondary to body habitus.  6-morbid obesity with Obstructive sleep apnea -Body mass index is 68.95 kg/m. -Low calorie diet, portion control and increase physical activity discussed with patient -Continue the use of CPAP nightly.  7-Hypothyroidism -Continue Synthroid  8-Pulmonary embolism (HCC) -Continue the use of Xarelto -Patient denies chest pain and shortness of breath.  9-Diabetes mellitus type 2 in obese (HCC) -Continue sliding scale insulin and long-acting -Modified carbohydrate diet has been recommended.  10-Chronic pain -continue home pain medication regimen -will use PRN muscle relaxants.  DVT prophylaxis: Xarelto Code Status: Full code Family Communication: No family at bedside. Disposition Plan: Continue IV antibiotics; nontender leg elevated, as needed pain medication and follow clinical response and culture results.  Consultants:   None  Procedures:   See below for x-ray reports  Antimicrobials:  Anti-infectives (From admission, onward)   Start     Dose/Rate Route Frequency Ordered Stop   06/20/18 0500  cefTRIAXone (ROCEPHIN) 1 g in sodium chloride 0.9 % 100 mL IVPB  Status:  Discontinued     1 g 200 mL/hr over 30 Minutes Intravenous Every 24 hours 06/19/18 2016 06/19/18 2016   06/19/18 2300  cefTRIAXone (ROCEPHIN) 1 g in sodium chloride 0.9 % 100 mL  IVPB     1 g 200 mL/hr over 30 Minutes Intravenous  Every 24 hours 06/19/18 2016     06/19/18 1615  ampicillin-sulbactam (UNASYN) 1.5 g in sodium chloride 0.9 % 100 mL IVPB  Status:  Discontinued     1.5 g 200 mL/hr over 30 Minutes Intravenous  Once 06/19/18 1603 06/19/18 1612   06/19/18 1615  Ampicillin-Sulbactam (UNASYN) 3 g in sodium chloride 0.9 % 100 mL IVPB     3 g 200 mL/hr over 30 Minutes Intravenous  Once 06/19/18 1612 06/19/18 1925      Subjective: Afebrile currently, morbidly obese chronically; denying chest pain and shortness of breath.  Patient expressed no nausea vomiting.  Feeling weak and tired.  Still with significant erythematous changes, swelling and tenderness in her left lower extremity.  Objective: Vitals:   06/19/18 2207 06/20/18 0221 06/20/18 0512 06/20/18 1313  BP: 117/68  132/65 136/73  Pulse: 92  92 90  Resp:    (!) 24  Temp: 99.7 F (37.6 C)  100 F (37.8 C) 99.4 F (37.4 C)  TempSrc: Oral  Oral Oral  SpO2: 95%  93% 91%  Weight:  (!) 199.7 kg    Height:  '5\' 7"'  (1.702 m)      Intake/Output Summary (Last 24 hours) at 06/20/2018 1740 Last data filed at 06/20/2018 1623 Gross per 24 hour  Intake 440 ml  Output 1050 ml  Net -610 ml   Filed Weights   06/20/18 0221  Weight: (!) 199.7 kg    Examination: General exam: Alert, awake, oriented x 3; reports feeling better Denies chest pain or shortness of breath.  Still having significant swelling and erythematous changes on her left lower extremity.  Some pain on palpation. Respiratory system: Clear to auscultation. Respiratory effort normal. Cardiovascular system:RRR. No murmurs, rubs, gallops. Gastrointestinal system: Abdomen is obese, nondistended, soft and nontender. No organomegaly or masses felt. Normal bowel sounds heard. Central nervous system: Alert and oriented. No focal neurological deficits. Extremities/skin: No cyanosis or clubbing; 2-3++ edema bilaterally (left lower extremity more than right lower extremity; erythematous changes and small  open puncture wound with serosanguineous drainage appreciated on exam of her left lower extremity. Skin: No rashes, lesions or ulcers Psychiatry: Judgement and insight appear normal. Mood & affect appropriate.     Data Reviewed: I have personally reviewed following labs and imaging studies  CBC: Recent Labs  Lab 06/19/18 1432 06/20/18 0256  WBC 10.2 10.4  NEUTROABS 8.8*  --   HGB 13.2 12.1  HCT 43.5 39.4  MCV 79.5* 79.0*  PLT 197 179   Basic Metabolic Panel: Recent Labs  Lab 06/19/18 1432 06/20/18 0256  NA 137 136  K 4.5 4.0  CL 102 103  CO2 25 24  GLUCOSE 155* 183*  BUN 12 16  CREATININE 1.02* 1.14*  CALCIUM 9.3 8.7*   GFR: Estimated Creatinine Clearance: 105.2 mL/min (A) (by C-G formula based on SCr of 1.14 mg/dL (H)).   Liver Function Tests: Recent Labs  Lab 06/19/18 1432  AST 21  ALT 16  ALKPHOS 90  BILITOT 0.7  PROT 7.3  ALBUMIN 3.8   Coagulation Profile: Recent Labs  Lab 06/19/18 1432  INR 1.8*   HbA1C: Recent Labs    06/19/18 2121  HGBA1C 9.0*   CBG: Recent Labs  Lab 06/19/18 2202 06/20/18 0758 06/20/18 1229 06/20/18 1719  GLUCAP 152* 141* 165* 170*   Urine analysis:    Component Value Date/Time   COLORURINE YELLOW 06/19/2018 1915  APPEARANCEUR HAZY (A) 06/19/2018 1915   APPEARANCEUR Clear 07/31/2013 1404   LABSPEC 1.020 06/19/2018 1915   PHURINE 7.0 06/19/2018 1915   GLUCOSEU NEGATIVE 06/19/2018 1915   HGBUR NEGATIVE 06/19/2018 1915   HGBUR negative 09/28/2009 1044   BILIRUBINUR NEGATIVE 06/19/2018 1915   BILIRUBINUR 1+ 10/31/2017 1721   BILIRUBINUR Negative 07/31/2013 1404   KETONESUR NEGATIVE 06/19/2018 1915   PROTEINUR NEGATIVE 06/19/2018 1915   UROBILINOGEN 0.2 10/31/2017 1721   UROBILINOGEN 0.2 07/03/2012 1027   NITRITE NEGATIVE 06/19/2018 1915   LEUKOCYTESUR NEGATIVE 06/19/2018 1915    Recent Results (from the past 240 hour(s))  Culture, blood (Routine x 2)     Status: None (Preliminary result)   Collection  Time: 06/19/18  2:32 PM  Result Value Ref Range Status   Specimen Description BLOOD RIGHT ANTECUBITAL  Final   Special Requests   Final    BOTTLES DRAWN AEROBIC AND ANAEROBIC Blood Culture results may not be optimal due to an inadequate volume of blood received in culture bottles   Culture   Final    NO GROWTH < 24 HOURS Performed at [facility], [place] [address], [place], [place] [address]    Report Status PENDING  Incomplete  Culture, blood (Routine x 2)     Status: None (Preliminary result)   Collection Time: 06/19/18  4:39 PM  Result Value Ref Range Status   Specimen Description BLOOD RIGHT ARM  Final   Special Requests   Final    BOTTLES DRAWN AEROBIC AND ANAEROBIC Blood Culture adequate volume   Culture   Final    NO GROWTH < 24 HOURS Performed at [facility], [place] [address], [place], [place] [address]    Report Status PENDING  Incomplete  SARS Coronavirus 2 (CEPHEID - Performed in [place] [facility] lab), Hosp Order     Status: None   Collection Time: 06/19/18  4:49 PM  Result Value Ref Range Status   SARS Coronavirus 2 NEGATIVE NEGATIVE Final    Comment: (NOTE) If result is NEGATIVE SARS-CoV-2 target nucleic acids are NOT DETECTED. The SARS-CoV-2 RNA is generally detectable in upper and lower  respiratory specimens during the acute phase of infection. The lowest  concentration of SARS-CoV-2 viral copies this assay can detect is 250  copies / mL. A negative result does not preclude SARS-CoV-2 infection  and should not be used as the sole basis for treatment or other  patient management decisions.  A negative result may occur with  improper specimen collection / handling, submission of specimen other  than nasopharyngeal swab, presence of viral mutation(s) within the  areas targeted by this assay, and inadequate number of viral copies  (<250 copies / mL). A negative result must be combined with clinical  observations, patient history, and epidemiological  information. If result is POSITIVE SARS-CoV-2 target nucleic acids are DETECTED. The SARS-CoV-2 RNA is generally detectable in upper and lower  respiratory specimens dur ing the acute phase of infection.  Positive  results are indicative of active infection with SARS-CoV-2.  Clinical  correlation with patient history and other diagnostic information is  necessary to determine patient infection status.  Positive results do  not rule out bacterial infection or co-infection with other viruses. If result is PRESUMPTIVE POSTIVE SARS-CoV-2 nucleic acids MAY BE PRESENT.   A presumptive positive result was obtained on the submitted specimen  and confirmed on repeat testing.  While 2019 novel coronavirus  (SARS-CoV-2) nucleic acids may be present in  the submitted sample  additional confirmatory testing may be necessary for epidemiological  and / or clinical management purposes  to differentiate between  SARS-CoV-2 and other Sarbecovirus currently known to infect humans.  If clinically indicated additional testing with an alternate test  methodology [phone]) is advised. The SARS-CoV-2 RNA is generally  detectable in upper and lower respiratory sp ecimens during the acute  phase of infection. The expected result is Negative. Fact Sheet for Patients:  StrictlyIdeas.no Fact Sheet for Healthcare Providers: BankingDealers.co.za This test is not yet approved or cleared by the [place] FDA and has been authorized for detection and/or diagnosis of SARS-CoV-2 by FDA under an Emergency Use Authorization (EUA).  This EUA will remain in effect (meaning this test can be used) for the duration of the COVID-19 declaration under Section 564(b)(1) of the Act, 21 U.S.C. section 360bbb-3(b)(1), unless the authorization is terminated or revoked sooner. Performed at [facility], [place] [address], [place], [place] [address]      Radiology Studies: Dg  Chest Portable 1 View  Result Date: 06/19/2018 CLINICAL DATA:  Fever and chills.  Leg swelling. EXAM: PORTABLE CHEST 1 VIEW COMPARISON:  CT chest dated March 12, 2018. Chest x-ray dated March 11, 2018. FINDINGS: Stable mild cardiomegaly. Normal mediastinal contours. Normal pulmonary vascularity. No focal consolidation, pleural effusion, or pneumothorax. No acute osseous abnormality. IMPRESSION: No active disease. Electronically Signed   By: Titus Dubin M.D.   On: 06/19/2018 15:42   Vas [place] Lower Extremity Venous (dvt) (mc And Wl 7a-7p)  Result Date: 06/20/2018  Lower Venous Study Indications: Swelling, and Edema.  Limitations: Body habitus, poor ultrasound/tissue interface and patient positioning. Performing Technologist: Abram Sander RVS  Examination Guidelines: A complete evaluation includes B-mode imaging, spectral Doppler, color Doppler, and power Doppler as needed of all accessible portions of each vessel. Bilateral testing is considered an integral part of a complete examination. Limited examinations for reoccurring indications may be performed as noted.  +---------+---------------+---------+-----------+----------+--------------+  LEFT      Compressibility Phasicity Spontaneity Properties Summary         +---------+---------------+---------+-----------+----------+--------------+  CFV       Full                                                             +---------+---------------+---------+-----------+----------+--------------+  SFJ       Full                                                             +---------+---------------+---------+-----------+----------+--------------+  FV Prox   Full                                                             +---------+---------------+---------+-----------+----------+--------------+  FV Mid  Not visualized  +---------+---------------+---------+-----------+----------+--------------+  FV Distal                                                   Not visualized  +---------+---------------+---------+-----------+----------+--------------+  PFV       Full                                                             +---------+---------------+---------+-----------+----------+--------------+  POP                                                        Not visualized  +---------+---------------+---------+-----------+----------+--------------+  PTV                                                        Not visualized  +---------+---------------+---------+-----------+----------+--------------+  PERO                                                       Not visualized  +---------+---------------+---------+-----------+----------+--------------+     Summary: Left: There is no evidence of deep vein thrombosis in the lower extremity. However, portions of this examination were limited- see technologist comments above. No cystic structure found in the popliteal fossa.  *See table(s) above for measurements and observations. Electronically signed by Monica Martinez MD on 06/20/2018 at 5:24:03 PM.    Final     Scheduled Meds:  [START ON 06/24/2018] Buprenorphine  1 patch Transdermal Q Tue   cyclobenzaprine  5 mg Oral Once   gabapentin  300 mg Oral BID   insulin aspart  0-20 Units Subcutaneous TID WC   insulin aspart  0-5 Units Subcutaneous QHS   insulin glargine  100 Units Subcutaneous QHS   levothyroxine  75 mcg Oral QAC breakfast   rivaroxaban  20 mg Oral Daily   sertraline  200 mg Oral QHS   sodium chloride flush  3 mL Intravenous Once   Continuous Infusions:  cefTRIAXone (ROCEPHIN)  IV Stopped (06/19/18 2348)     LOS: 1 day    Time spent: 30 minutes   Barton Dubois, MD [facility] Pager [phone]   06/20/2018, 5:40 PM

## 2018-06-21 LAB — GLUCOSE, CAPILLARY
Glucose-Capillary: 177 mg/dL — ABNORMAL HIGH (ref 70–99)
Glucose-Capillary: 207 mg/dL — ABNORMAL HIGH (ref 70–99)
Glucose-Capillary: 252 mg/dL — ABNORMAL HIGH (ref 70–99)
Glucose-Capillary: 246 mg/dL — ABNORMAL HIGH (ref 70–99)

## 2018-06-21 MED ORDER — BUPRENORPHINE 15 MCG/HR TD PTWK
1.0000 | MEDICATED_PATCH | TRANSDERMAL | Status: DC
  Administered 2018-06-21: 1 via TRANSDERMAL
  Filled 2018-06-21: qty 1

## 2018-06-21 MED ORDER — FUROSEMIDE 10 MG/ML IJ SOLN
20.0000 mg | Freq: Once | INTRAMUSCULAR | Status: AC
  Administered 2018-06-21: 20 mg via INTRAVENOUS
  Filled 2018-06-21: qty 2

## 2018-06-21 NOTE — Plan of Care (Signed)

## 2018-06-21 NOTE — Progress Notes (Signed)
Patient called out to c/o blood on her purwick and burning sensation.  Purwick left off for approximately 4 hours.  Patient was able to ambulate to bathroom without difficulty.  Assisted back to be, peri care given, and replaced purwick.  Small amount of Labial abrasions to bilateral labia.  Cleaned with soap and water, dried and applied skin protectant.  Instructed patient to let nurse know of any additional problems, verbalized understanding.  Report given to oncoming nurse.

## 2018-06-21 NOTE — Progress Notes (Signed)
Patient stated she would place herself on CPAP tonight when she is ready for bed. RT informed patient to have RT called if assistance is needed. RT will monitor as needed.

## 2018-06-21 NOTE — Progress Notes (Signed)
PROGRESS NOTE    Cheyenne Guzman  WUJ:[id number] DOB: April 29, 1965 DOA: 06/19/2018 PCP: Laurey Morale, MD     Brief Narrative:  53 y.o. female with medical history significant for insulin-dependent type 2 diabetes, pulmonary embolism on Xarelto, hyperlipidemia, hypothyroidism, rheumatoid arthritis, asthma, depression and anxiety, chronic pain syndrome, and OSA who presents the ED with progressively worsening left lower leg pain and erythema over the last several days.  She reports having a small scratch from her dog on her left lower leg about 1 week ago and noticed some yellow-colored discharge from the area.  This morning she developed fever of 102.8 Fahrenheit at home associated with chills, diaphoresis, and myalgias.  She reports swelling of both of her legs, left greater than right.  She has had nausea without emesis.  She denies any chest pain, palpitations, dyspnea, cough, abdominal pain, diarrhea, dysuria.  She was diagnosed with PE in February 2020 for which she is on Xarelto.  She denies any obvious bleeding.  ED Course:  Initial vitals showed BP 133/72, pulse 108, RR 12, temp 99.3 Fahrenheit, SPO2 94% on room air.  She later developed a fever of 102.5 Fahrenheit and was transiently hypotensive in the 90-100/40-50s.  Labs are notable for WBC 10.2, hemoglobin 13.2, platelets 197,000, potassium 4.5, BUN 12, creatinine 1.02, serum glucose 155, BNP 193.4, beta-hCG <5.0, and lactic acid 2.5.  SARS-CoV-2 test was negative.  Blood cultures were obtained and pending.  Urinalysis negative for UTI.   Assessment & Plan: 1-Sepsis [facility]) -Patient met sepsis criteria on presentation and appears to be secondary to left lower extremity cellulitis. -Continue current IV antibiotics -Follow cultures -Keep leg elevated -As needed pain medication, IV fluids -Sepsis features resolving -Patient currently afebrile.  2-Hyperlipidemia associated with type 2 diabetes mellitus ([place]) -Continue  statins  3-Anxiety and depression -No suicidal ideation or hallucination -Mood overall stable -Continue home psych medication regimen.  4-Asthma -No wheezing on exam -No using accessory muscles on with acute air movement bilaterally. -Continue as needed albuterol.  5-Cellulitis of left leg -As mentioned above we will continue IV antibiotics -Leg elevation -Follow clinical response. -Still with significant swelling, erythema and tenderness of outpatient. -No evidence of DVT in left lower extremity.  However portion of the examination were limited secondary to body habitus.  6-morbid obesity with Obstructive sleep apnea -Body mass index is 68.95 kg/m. -Low calorie diet, portion control and increase physical activity discussed with patient -Continue the use of CPAP nightly.  7-Hypothyroidism -Continue Synthroid  8-Pulmonary embolism ([facility]) -Continue the use of Xarelto -Patient denies chest pain and shortness of breath.  9-Diabetes mellitus type 2 in obese ([facility]CC) -Continue sliding scale insulin and long-acting -Modified carbohydrate diet has been recommended.  10-Chronic pain -continue home pain medication regimen -Continue the use of PRN muscle relaxants.  DVT prophylaxis: Xarelto Code Status: Full code Family Communication: No family at bedside. Disposition Plan: Continue IV antibiotics given complaints of intermittent nausea; continue leg elevated, as needed pain medication and follow clinical response and culture results.  Consultants:   None  Procedures:   See below for x-ray reports  Antimicrobials:  Anti-infectives (From admission, onward)   Start     Dose/Rate Route Frequency Ordered Stop   06/20/18 0500  cefTRIAXone (ROCEPHIN) 1 g in sodium chloride 0.9 % 100 mL IVPB  Status:  Discontinued     1 g 200 mL/hr over 30 Minutes Intravenous Every 24 hours 06/19/18 2016 06/19/18 2016   06/19/18 2300  cefTRIAXone (ROCEPHIN) 1 g  in sodium chloride 0.9 % 100 mL  IVPB     1 g 200 mL/hr over 30 Minutes Intravenous Every 24 hours 06/19/18 2016     06/19/18 1615  ampicillin-sulbactam (UNASYN) 1.5 g in sodium chloride 0.9 % 100 mL IVPB  Status:  Discontinued     1.5 g 200 mL/hr over 30 Minutes Intravenous  Once 06/19/18 1603 06/19/18 1612   06/19/18 1615  Ampicillin-Sulbactam (UNASYN) 3 g in sodium chloride 0.9 % 100 mL IVPB     3 g 200 mL/hr over 30 Minutes Intravenous  Once 06/19/18 1612 06/19/18 1925      Subjective: Afebrile currently, morbidly obese; no chest pain or shortness of breath.  Expressing intermittent nausea, but denies vomiting.  Complaining of left lower extremity worsening redness and pain to palpation.  Objective: Vitals:   06/20/18 2107 06/21/18 0442 06/21/18 0448 06/21/18 1500  BP: 136/73 124/72 (!) 127/56 135/84  Pulse: 93 79 80 82  Resp: 16     Temp: 98.9 F (37.2 C) 97.9 F (36.6 C)  100 F (37.8 C)  TempSrc: Oral Oral  Oral  SpO2: 95% 97% 99% 96%  Weight:      Height:        Intake/Output Summary (Last 24 hours) at 06/21/2018 1801 Last data filed at 06/21/2018 1500 Gross per 24 hour  Intake 819.6 ml  Output 550 ml  Net 269.6 ml   Filed Weights   06/20/18 0221  Weight: (!) 199.7 kg    Examination: General exam: Alert, awake, oriented x 3; no chest pain, no shortness of breath, no vomiting.  Still with significant swelling and redness in her left lower extremity. Patient expressed intermittent nausea today. Respiratory system: Clear to auscultation. Respiratory effort normal. Cardiovascular system:RRR. No murmurs, rubs, gallops. Gastrointestinal system: Abdomen is obese nondistended, soft and nontender. No organomegaly or masses felt. Normal bowel sounds heard. Central nervous system: Alert and oriented. No focal neurological deficits. Extremities: No cyanosis or clubbing.  2-3+ edema bilaterally (left lower extremity more than right lower extremity; there is erythematous changes appreciated no drainage;  tender to palpation). Psychiatry: Judgement and insight appear normal. Mood & affect appropriate.    Data Reviewed: I have personally reviewed following labs and imaging studies  CBC: Recent Labs  Lab 06/19/18 1432 06/20/18 0256  WBC 10.2 10.4  NEUTROABS 8.8*  --   HGB 13.2 12.1  HCT 43.5 39.4  MCV 79.5* 79.0*  PLT 197 226   Basic Metabolic Panel: Recent Labs  Lab 06/19/18 1432 06/20/18 0256  NA 137 136  K 4.5 4.0  CL 102 103  CO2 25 24  GLUCOSE 155* 183*  BUN 12 16  CREATININE 1.02* 1.14*  CALCIUM 9.3 8.7*   GFR: Estimated Creatinine Clearance: 105.2 mL/min (A) (by C-G formula based on SCr of 1.14 mg/dL (H)).   Liver Function Tests: Recent Labs  Lab 06/19/18 1432  AST 21  ALT 16  ALKPHOS 90  BILITOT 0.7  PROT 7.3  ALBUMIN 3.8   Coagulation Profile: Recent Labs  Lab 06/19/18 1432  INR 1.8*   HbA1C: Recent Labs    06/19/18 2121  HGBA1C 9.0*   CBG: Recent Labs  Lab 06/20/18 1719 06/20/18 2104 06/21/18 0834 06/21/18 1217 06/21/18 1746  GLUCAP 170* 247* 177* 207* 252*   Urine analysis:    Component Value Date/Time   COLORURINE YELLOW 06/19/2018 1915   APPEARANCEUR HAZY (A) 06/19/2018 1915   APPEARANCEUR Clear 07/31/2013 1404   LABSPEC 1.020 06/19/2018 1915  PHURINE 7.0 06/19/2018 1915   GLUCOSEU NEGATIVE 06/19/2018 1915   HGBUR NEGATIVE 06/19/2018 1915   HGBUR negative 09/28/2009 [place] 06/19/2018 1915   BILIRUBINUR 1+ 10/31/2017 1721   BILIRUBINUR Negative 07/31/2013 1404   KETONESUR NEGATIVE 06/19/2018 1915   PROTEINUR NEGATIVE 06/19/2018 1915   UROBILINOGEN 0.2 10/31/2017 1721   UROBILINOGEN 0.2 07/03/2012 1027   NITRITE NEGATIVE 06/19/2018 1915   LEUKOCYTESUR NEGATIVE 06/19/2018 1915    Recent Results (from the past 240 hour(s))  Culture, blood (Routine x 2)     Status: None (Preliminary result)   Collection Time: 06/19/18  2:32 PM  Result Value Ref Range Status   Specimen Description BLOOD RIGHT  ANTECUBITAL  Final   Special Requests   Final    BOTTLES DRAWN AEROBIC AND ANAEROBIC Blood Culture results may not be optimal due to an inadequate volume of blood received in culture bottles   Culture   Final    NO GROWTH 2 DAYS Performed at [facility], [place] [address], [place], [place] [address]    Report Status PENDING  Incomplete  Culture, blood (Routine x 2)     Status: None (Preliminary result)   Collection Time: 06/19/18  4:39 PM  Result Value Ref Range Status   Specimen Description BLOOD RIGHT ARM  Final   Special Requests   Final    BOTTLES DRAWN AEROBIC AND ANAEROBIC Blood Culture adequate volume   Culture   Final    NO GROWTH 2 DAYS Performed at [facility], [address] [place], [place] [address]    Report Status PENDING  Incomplete  SARS Coronavirus 2 (CEPHEID - Performed in [place] hospital lab), Hosp Order     Status: None   Collection Time: 06/19/18  4:49 PM  Result Value Ref Range Status   SARS Coronavirus 2 NEGATIVE NEGATIVE Final    Comment: (NOTE) If result is NEGATIVE SARS-CoV-2 target nucleic acids are NOT DETECTED. The SARS-CoV-2 RNA is generally detectable in upper and lower  respiratory specimens during the acute phase of infection. The lowest  concentration of SARS-CoV-2 viral copies this assay can detect is 250  copies / mL. A negative result does not preclude SARS-CoV-2 infection  and should not be used as the sole basis for treatment or other  patient management decisions.  A negative result may occur with  improper specimen collection / handling, submission of specimen other  than nasopharyngeal swab, presence of viral mutation(s) within the  areas targeted by this assay, and inadequate number of viral copies  (<250 copies / mL). A negative result must be combined with clinical  observations, patient history, and epidemiological information. If result is POSITIVE SARS-CoV-2 target nucleic acids are DETECTED. The SARS-CoV-2 RNA  is generally detectable in upper and lower  respiratory specimens dur ing the acute phase of infection.  Positive  results are indicative of active infection with SARS-CoV-2.  Clinical  correlation with patient history and other diagnostic information is  necessary to determine patient infection status.  Positive results do  not rule out bacterial infection or co-infection with other viruses. If result is PRESUMPTIVE POSTIVE SARS-CoV-2 nucleic acids MAY BE PRESENT.   A presumptive positive result was obtained on the submitted specimen  and confirmed on repeat testing.  While 2019 novel coronavirus  (SARS-CoV-2) nucleic acids may be present in the submitted sample  additional confirmatory testing may be necessary for epidemiological  and / or clinical management purposes  to  differentiate between  SARS-CoV-2 and other Sarbecovirus currently known to infect humans.  If clinically indicated additional testing with an alternate test  methodology [phone]) is advised. The SARS-CoV-2 RNA is generally  detectable in upper and lower respiratory sp ecimens during the acute  phase of infection. The expected result is Negative. Fact Sheet for Patients:  StrictlyIdeas.no Fact Sheet for Healthcare Providers: BankingDealers.co.za This test is not yet approved or cleared by the [place] FDA and has been authorized for detection and/or diagnosis of SARS-CoV-2 by FDA under an Emergency Use Authorization (EUA).  This EUA will remain in effect (meaning this test can be used) for the duration of the COVID-19 declaration under Section 564(b)(1) of the Act, 21 U.S.C. section 360bbb-3(b)(1), unless the authorization is terminated or revoked sooner. Performed at [facility], [place] [address] [place], [place] [address]      Radiology Studies: Vas [place] Lower Extremity Venous (dvt) (mc And Wl 7a-7p)  Result Date: 06/20/2018  Lower Venous Study  Indications: Swelling, and Edema.  Limitations: Body habitus, poor ultrasound/tissue interface and patient positioning. Performing Technologist: Abram Sander RVS  Examination Guidelines: A complete evaluation includes B-mode imaging, spectral Doppler, color Doppler, and power Doppler as needed of all accessible portions of each vessel. Bilateral testing is considered an integral part of a complete examination. Limited examinations for reoccurring indications may be performed as noted.  +---------+---------------+---------+-----------+----------+--------------+  LEFT      Compressibility Phasicity Spontaneity Properties Summary         +---------+---------------+---------+-----------+----------+--------------+  CFV       Full                                                             +---------+---------------+---------+-----------+----------+--------------+  SFJ       Full                                                             +---------+---------------+---------+-----------+----------+--------------+  FV Prox   Full                                                             +---------+---------------+---------+-----------+----------+--------------+  FV Mid                                                     Not visualized  +---------+---------------+---------+-----------+----------+--------------+  FV Distal                                                  Not visualized  +---------+---------------+---------+-----------+----------+--------------+  PFV       Full                                                             +---------+---------------+---------+-----------+----------+--------------+  POP                                                        Not visualized  +---------+---------------+---------+-----------+----------+--------------+  PTV                                                        Not visualized  +---------+---------------+---------+-----------+----------+--------------+  PERO                                                        Not visualized  +---------+---------------+---------+-----------+----------+--------------+     Summary: Left: There is no evidence of deep vein thrombosis in the lower extremity. However, portions of this examination were limited- see technologist comments above. No cystic structure found in the popliteal fossa.  *See table(s) above for measurements and observations. Electronically signed by Monica Martinez MD on 06/20/2018 at 5:24:03 PM.    Final     Scheduled Meds:  Buprenorphine  1 patch Transdermal Q Sat   gabapentin  300 mg Oral BID   insulin aspart  0-20 Units Subcutaneous TID WC   insulin aspart  0-5 Units Subcutaneous QHS   insulin glargine  100 Units Subcutaneous QHS   levothyroxine  75 mcg Oral QAC breakfast   rivaroxaban  20 mg Oral Daily   sertraline  200 mg Oral QHS   sodium chloride flush  3 mL Intravenous Once   Continuous Infusions:  cefTRIAXone (ROCEPHIN)  IV Stopped (06/21/18 0123)     LOS: 2 days    Time spent: 30 minutes   Barton Dubois, MD [facility] Pager [phone]   06/21/2018, 6:01 PM

## 2018-06-22 LAB — CBC
HCT: 38 % (ref 36.0–46.0)
Hemoglobin: 11.5 g/dL — ABNORMAL LOW (ref 12.0–15.0)
MCH: 23.8 pg — ABNORMAL LOW (ref 26.0–34.0)
MCHC: 30.3 g/dL (ref 30.0–36.0)
MCV: 78.5 fL — ABNORMAL LOW (ref 80.0–100.0)
Platelets: 182 10*3/uL (ref 150–400)
RBC: 4.84 MIL/uL (ref 3.87–5.11)
RDW: 16.6 % — ABNORMAL HIGH (ref 11.5–15.5)
WBC: 6.5 10*3/uL (ref 4.0–10.5)
nRBC: 0 % (ref 0.0–0.2)

## 2018-06-22 LAB — BASIC METABOLIC PANEL
Anion gap: 11 (ref 5–15)
BUN: 16 mg/dL (ref 6–20)
CO2: 26 mmol/L (ref 22–32)
Calcium: 8.8 mg/dL — ABNORMAL LOW (ref 8.9–10.3)
Chloride: 100 mmol/L (ref 98–111)
Creatinine, Ser: 1.03 mg/dL — ABNORMAL HIGH (ref 0.44–1.00)
GFR calc Af Amer: >60 mL/min (ref >60)
GFR calc non Af Amer: >60 mL/min (ref >60)
Glucose, Bld: 217 mg/dL — ABNORMAL HIGH (ref 70–99)
Potassium: 4 mmol/L (ref 3.5–5.1)
Sodium: 137 mmol/L (ref 135–145)

## 2018-06-22 LAB — GLUCOSE, CAPILLARY
Glucose-Capillary: 204 mg/dL — ABNORMAL HIGH (ref 70–99)
Glucose-Capillary: 234 mg/dL — ABNORMAL HIGH (ref 70–99)

## 2018-06-22 MED ORDER — CEPHALEXIN 500 MG PO CAPS: 500.0000 mg | ORAL_CAPSULE | Freq: Three times a day (TID) | ORAL | 0 refills | Status: AC

## 2018-06-22 NOTE — Discharge Summary (Signed)
Physician Discharge Summary  Cheyenne Guzman FXJ:[id number] DOB: Jul 22, 1965 DOA: 06/19/2018  PCP: Cheyenne Morale, MD  Admit date: 06/19/2018 Discharge date: 06/22/2018  Time spent: 35 minutes  Recommendations for Outpatient Follow-up:  1. Repeat basic metabolic panel to follow electrolytes and renal function 2. Reassess complete resolution of left lower extremity cellulitis 3. Outpatient follow-up with lymphedema clinic 4. Continue assisting with weight loss journey as much as possible; including referral to bariatric clinic.   Discharge Diagnoses:  Principal Problem:   Sepsis ([name]) Active Problems:   Hyperlipidemia associated with type 2 diabetes mellitus (HCC)   Anxiety and depression   Asthma   Cellulitis of left lower extremity   Obstructive sleep apnea   Hypothyroidism   Pulmonary embolism (HCC)   Diabetes mellitus type 2 in obese (HCC)   Chronic pain   Morbid obesity with body mass index of 60.0-69.9 in adult [name])   Discharge Condition: Stable and improved.  Patient discharged home with instruction to follow-up with PCP in 10 days.  Diet recommendation: Modified carbohydrates and low calorie diet.  Filed Weights   06/20/18 0221  Weight: (!) 199.7 kg    History of present illness:  53 y.o.femalewith medical history significant forinsulin-dependent type 2 diabetes, pulmonary embolism on Xarelto, hyperlipidemia, hypothyroidism, rheumatoid arthritis, asthma, depression and anxiety, chronic pain syndrome, and OSA who presents the ED withprogressively worsening left lower leg pain and erythema over the last several days. She reports having a small scratch from her dog on her left lower leg about 1 week ago and noticed some yellow-colored discharge from the area. This morning she developed fever of 102.8 Fahrenheit at home associated with chills, diaphoresis, and myalgias. She reports swelling of both of her legs, left greater than right.  She has had nausea without  emesis. She denies any chest pain, palpitations, dyspnea, cough, abdominal pain, diarrhea, dysuria. She was diagnosed with PE in February 2020 for which she is on Xarelto. She denies any obvious bleeding.  ED Course: Initial vitals showed BP 133/72, pulse 108, RR 12, temp 99.3 Fahrenheit, SPO2 94% on room air. She later developed a fever of 102.5 Fahrenheit and was transiently hypotensive in the 90-100/40-50s.  Labs are notable for WBC 10.2, hemoglobin 13.2, platelets 197,000, potassium 4.5, BUN 12, creatinine 1.02, serum glucose 155, BNP 193.4, beta-hCG <5.0, and lactic acid 2.5.  SARS-CoV-2 test was negative. Blood cultures were obtained and pending. Urinalysis negative for UTI.  Hospital Course:  1-Sepsis [facility]) -Patient met sepsis criteria on presentation and appears to be secondary to left lower extremity cellulitis. -Patient will be discharged on Keflex to complete antibiotic therapy by mouth. -Follow cultures, so far no growth -Keep leg elevated -continue As needed pain medication -Sepsis features resolved -Patient currently afebrile.  2-Hyperlipidemia associated with type 2 diabetes mellitus ([place]) -Continue statins  3-Anxiety and depression -No suicidal ideation or hallucination -Mood stable -Continue home psych medication regimen.  4-Asthma -No wheezing on exam -No using accessory muscles on with acute air movement bilaterally. -Continue as needed albuterol.  5-Cellulitis of left leg -Improved and stable to continue outpatient therapy with oral antibiotics. -Patient instructed to keep leg elevated as much as possible. -Still with some erythematous changes, swelling and tenderness on palpation. Overall improved.  -No evidence of DVT in left lower extremity.  However portion of the examination were limited secondary to body habitus.  6-morbid obesity with Obstructive sleep apnea -Body mass index is 68.95 kg/m. -Low calorie diet, portion control and  increase physical activity  discussed with patient -Continue the use of CPAP nightly.  7-Hypothyroidism -Continue Synthroid  8-Pulmonary embolism (HCC) -Continue the use of Xarelto -Patient denies chest pain and shortness of breath.  9-Diabetes mellitus type 2 in obese ([name]) -Resume home hypoglycemic regimen. -Modified carbohydrate diet has been recommended.  10-Chronic pain -continue home pain medication regimen -Continue the use of PRN muscle relaxants  Procedures:  See below for x-ray reports.  Lower extremity duplex: No DVT, abscess or underlying cyst on exam.  Body habitus interfering with accurate reading.  Consultations:  None  Discharge Exam: Vitals:   06/21/18 2109 06/22/18 0600  BP: (!) 125/57 117/66  Pulse: 86 77  Resp: 16 20  Temp: 98.9 F (37.2 C) (!) 97.4 F (36.3 C)  SpO2: 94% 97%    General: Alert, awake and oriented x3; no chest pain, no shortness of breath, no nausea, no vomiting.  Still having some redness and pain in her left lower extremity, but there has been improvement in swelling overall and patient feeling ready to go home. Cardiovascular: RRR; no murmurs, no rubs, no gallops. Respiratory: Clear to auscultation bilaterally. Abdomen: Obese, soft, nontender, nondistended, positive bowel sounds Extremities: No cyanosis or clubbing.  2-3+ edema bilaterally secondary to chronic lymphedema; left lower extremity with a slightly more swelling than the right and with positive erythematous changes, mild tenderness on palpation, no open wounds or drainage on exam.  Discharge Instructions   Discharge Instructions    Diet - low sodium heart healthy   Complete by:  As directed    Discharge instructions   Complete by:  As directed    Maintain left leg elevated as much as possible  Keep yourself well-hydrated Take medications as prescribed  Arrange follow up with PCP in 10 days.     Allergies as of 06/22/2018      Reactions   Vicodin  [hydrocodone-acetaminophen] Nausea And Vomiting      Medication List    TAKE these medications   albuterol (2.5 MG/3ML) 0.083% nebulizer solution Commonly known as:  PROVENTIL 1 VIAL IN NEBULIZER EVERY 4 HOURS AS NEEDED FOR WHEEZING What changed:  See the new instructions.   albuterol 108 (90 Base) MCG/ACT inhaler Commonly known as:  VENTOLIN HFA Inhale 2 puffs into the lungs every 4 (four) hours as needed for wheezing or shortness of breath. What changed:  Another medication with the same name was changed. Make sure you understand how and when to take each.   ALPRAZolam 1 MG tablet Commonly known as:  XANAX TAKE 1 TABLET BY MOUTH THREE TIMES A DAY   AZO-CRANBERRY PO Take 2 tablets by mouth daily.   Buprenorphine 15 MCG/HR Ptwk Place 1 patch onto the skin every Tuesday.   cephALEXin 500 MG capsule Commonly known as:  KEFLEX Take 1 capsule (500 mg total) by mouth 3 (three) times daily for 10 days.   Contour Next Monitor w/Device Kit 1 each by Does not apply route 2 (two) times daily. To check blood sugars twice a day.   furosemide 40 MG tablet Commonly known as:  LASIX TAKE 1 TABLET (40 MG TOTAL) BY MOUTH DAILY. AS NEEDED FOR SWELLING. What changed:  additional instructions   gabapentin 300 MG capsule Commonly known as:  NEURONTIN Take 300 mg by mouth 2 (two) times daily.   glucose blood test strip Use as instructed   glucose blood test strip Commonly known as:  Contour Next Test Use to test blood sugar 2 times daily   Insulin Glargine  100 UNIT/ML Solostar Pen Commonly known as:  Lantus SoloStar Inject 280 Units into the skin every morning. and pen needles 3/day What changed:    how much to take  when to take this   levothyroxine 75 MCG tablet Commonly known as:  SYNTHROID Take 1 tablet (75 mcg total) by mouth daily.   methocarbamol 750 MG tablet Commonly known as:  ROBAXIN Take 1 tablet (750 mg total) by mouth 2 (two) times daily. What changed:  when  to take this   rivaroxaban 20 MG Tabs tablet Commonly known as:  XARELTO Take 20 mg by mouth daily.   sertraline 100 MG tablet Commonly known as:  ZOLOFT TAKE 2 TABLETS (200 MG TOTAL) BY MOUTH AT BEDTIME. What changed:  when to take this      Allergies  Allergen Reactions  . Vicodin [Hydrocodone-Acetaminophen] Nausea And Vomiting   Follow-up Information    Cheyenne Morale, MD. Schedule an appointment as soon as possible for a visit in 10 day(s).   Specialty:  Family Medicine Contact information: [place] [address] [phone]           The results of significant diagnostics from this hospitalization (including imaging, microbiology, ancillary and laboratory) are listed below for reference.    Significant Diagnostic Studies: Dg Chest Portable 1 View  Result Date: 06/19/2018 CLINICAL DATA:  Fever and chills.  Leg swelling. EXAM: PORTABLE CHEST 1 VIEW COMPARISON:  CT chest dated March 12, 2018. Chest x-ray dated March 11, 2018. FINDINGS: Stable mild cardiomegaly. Normal mediastinal contours. Normal pulmonary vascularity. No focal consolidation, pleural effusion, or pneumothorax. No acute osseous abnormality. IMPRESSION: No active disease. Electronically Signed   By: Titus Dubin M.D.   On: 06/19/2018 15:42   Vas [place] Lower Extremity Venous (dvt) ([facility] 7a-7p)  Result Date: 06/20/2018  Lower Venous Study Indications: Swelling, and Edema.  Limitations: Body habitus, poor ultrasound/tissue interface and patient positioning. Performing Technologist: Abram Sander RVS  Examination Guidelines: A complete evaluation includes B-mode imaging, spectral Doppler, color Doppler, and power Doppler as needed of all accessible portions of each vessel. Bilateral testing is considered an integral part of a complete examination. Limited examinations for reoccurring indications may be performed as noted.   +---------+---------------+---------+-----------+----------+--------------+ LEFT     CompressibilityPhasicitySpontaneityPropertiesSummary        +---------+---------------+---------+-----------+----------+--------------+ CFV      Full                                                        +---------+---------------+---------+-----------+----------+--------------+ SFJ      Full                                                        +---------+---------------+---------+-----------+----------+--------------+ FV Prox  Full                                                        +---------+---------------+---------+-----------+----------+--------------+ FV Mid  Not visualized +---------+---------------+---------+-----------+----------+--------------+ FV Distal                                             Not visualized +---------+---------------+---------+-----------+----------+--------------+ PFV      Full                                                        +---------+---------------+---------+-----------+----------+--------------+ POP                                                   Not visualized +---------+---------------+---------+-----------+----------+--------------+ PTV                                                   Not visualized +---------+---------------+---------+-----------+----------+--------------+ PERO                                                  Not visualized +---------+---------------+---------+-----------+----------+--------------+     Summary: Left: There is no evidence of deep vein thrombosis in the lower extremity. However, portions of this examination were limited- see technologist comments above. No cystic structure found in the popliteal fossa.  *See table(s) above for measurements and observations. Electronically signed by Monica Martinez MD on 06/20/2018 at 5:24:03 PM.     Final     Microbiology: Recent Results (from the past 240 hour(s))  Culture, blood (Routine x 2)     Status: None (Preliminary result)   Collection Time: 06/19/18  2:32 PM  Result Value Ref Range Status   Specimen Description BLOOD RIGHT ANTECUBITAL  Final   Special Requests   Final    BOTTLES DRAWN AEROBIC AND ANAEROBIC Blood Culture results may not be optimal due to an inadequate volume of blood received in culture bottles   Culture   Final    NO GROWTH 2 DAYS Performed at [facility], [place] [address] [place], [place] [address]    Report Status PENDING  Incomplete  Culture, blood (Routine x 2)     Status: None (Preliminary result)   Collection Time: 06/19/18  4:39 PM  Result Value Ref Range Status   Specimen Description BLOOD RIGHT ARM  Final   Special Requests   Final    BOTTLES DRAWN AEROBIC AND ANAEROBIC Blood Culture adequate volume   Culture   Final    NO GROWTH 2 DAYS Performed at [facility], [address] [place], [place] [address]    Report Status PENDING  Incomplete  SARS Coronavirus 2 (CEPHEID - Performed in [place] hospital lab), Hosp Order     Status: None   Collection Time: 06/19/18  4:49 PM  Result Value Ref Range Status   SARS Coronavirus 2 NEGATIVE NEGATIVE Final    Comment: (NOTE) If result is NEGATIVE SARS-CoV-2 target nucleic acids are NOT  DETECTED. The SARS-CoV-2 RNA is generally detectable in upper and lower  respiratory specimens during the acute phase of infection. The lowest  concentration of SARS-CoV-2 viral copies this assay can detect is 250  copies / mL. A negative result does not preclude SARS-CoV-2 infection  and should not be used as the sole basis for treatment or other  patient management decisions.  A negative result may occur with  improper specimen collection / handling, submission of specimen other  than nasopharyngeal swab, presence of viral mutation(s) within the  areas targeted by this assay, and inadequate  number of viral copies  (<250 copies / mL). A negative result must be combined with clinical  observations, patient history, and epidemiological information. If result is POSITIVE SARS-CoV-2 target nucleic acids are DETECTED. The SARS-CoV-2 RNA is generally detectable in upper and lower  respiratory specimens dur ing the acute phase of infection.  Positive  results are indicative of active infection with SARS-CoV-2.  Clinical  correlation with patient history and other diagnostic information is  necessary to determine patient infection status.  Positive results do  not rule out bacterial infection or co-infection with other viruses. If result is PRESUMPTIVE POSTIVE SARS-CoV-2 nucleic acids MAY BE PRESENT.   A presumptive positive result was obtained on the submitted specimen  and confirmed on repeat testing.  While 2019 novel coronavirus  (SARS-CoV-2) nucleic acids may be present in the submitted sample  additional confirmatory testing may be necessary for epidemiological  and / or clinical management purposes  to differentiate between  SARS-CoV-2 and other Sarbecovirus currently known to infect humans.  If clinically indicated additional testing with an alternate test  methodology [phone]) is advised. The SARS-CoV-2 RNA is generally  detectable in upper and lower respiratory sp ecimens during the acute  phase of infection. The expected result is Negative. Fact Sheet for Patients:  StrictlyIdeas.no Fact Sheet for Healthcare Providers: BankingDealers.co.za This test is not yet approved or cleared by the [place] FDA and has been authorized for detection and/or diagnosis of SARS-CoV-2 by FDA under an Emergency Use Authorization (EUA).  This EUA will remain in effect (meaning this test can be used) for the duration of the COVID-19 declaration under Section 564(b)(1) of the Act, 21 U.S.C. section 360bbb-3(b)(1), unless the  authorization is terminated or revoked sooner. Performed at [facility], [place] [address] [place], [place] [address]      Labs: Basic Metabolic Panel: Recent Labs  Lab 06/19/18 1432 06/20/18 0256 06/22/18 0724  NA 137 136 137  K 4.5 4.0 4.0  CL 102 103 100  CO2 _0 GLUCOSE 155* 183* 217*  BUN _1 CREATININE 1.02* 1.14* 1.03*  CALCIUM 9.3 8.7* 8.8*   Liver Function Tests: Recent Labs  Lab 06/19/18 1432  AST 21  ALT 16  ALKPHOS 90  BILITOT 0.7  PROT 7.3  ALBUMIN 3.8   CBC: Recent Labs  Lab 06/19/18 1432 06/20/18 0256 06/22/18 0724  WBC 10.2 10.4 6.5  NEUTROABS 8.8*  --   --   HGB 13.2 12.1 11.5*  HCT 43.5 39.4 38.0  MCV 79.5* 79.0* 78.5*  PLT 197 166 182   BNP (last 3 results) Recent Labs    03/11/18 2021 06/19/18 1604  BNP 74.5 193.4*    CBG: Recent Labs  Lab 06/21/18 1217 06/21/18 1746 06/21/18 2109 06/22/18 0912 06/22/18 1217  GLUCAP 207* 252* 246* 204* 234*    Signed:  Barton Dubois MD.  [facility]  06/22/2018, 1:39 PM

## 2018-06-22 NOTE — Progress Notes (Signed)
Patient discharge instruction reviewed and patient receive prescription and eduated on cellulitis. Patient IV removed and site intact.

## 2018-06-22 NOTE — Discharge Instructions (Signed)

## 2018-06-22 NOTE — Plan of Care (Addendum)
Patient bathed (2 staff present) per large urine incontinence while standing at sink.  Transferred to/from bed/chair with difficulty per chronic stiffness and chronic musckuloskeltal pain complaints and body habitus.  Antifungal powder applied to abdominal folds, peri area cleaned.  C/o vuvlvar pain where Purewick was removed during day.  Area inspected and c/o tenderness.  Purewick replaced when returned to bed per pt request.    L lower leg redness withing marked area, c/o pain when touched and burning.  Provided Tylenol PRN and Buprenorphine patch applied to R arm.  C/o chronic burning, numbness, tingling in lower feet.

## 2018-06-23 ENCOUNTER — Telehealth: Payer: Self-pay | Admitting: *Deleted

## 2018-06-23 NOTE — Telephone Encounter (Signed)
Transition Care Management Follow-up Telephone Call   Date discharged? 06/22/2018   How have you been since you were released from the hospital? " I have been okay"    Do you understand why you were in the hospital? yes   Do you understand the discharge instructions? yes   Where were you discharged to? Home   Items Reviewed:  Medications reviewed: yes  Allergies reviewed: yes  Dietary changes reviewed: yes  Referrals reviewed: N/A   Functional Questionnaire:   Activities of Daily Living (ADLs):   She states they are independent in the following: feeding, continence, grooming, toileting and dressing States they require assistance with the following: ambulation   Any transportation issues/concerns?: no   Any patient concerns? no   Confirmed importance and date/time of follow-up visits scheduled yes    Provider Appointment booked with: Dr. Sarajane Jews  Tuesday 06/24/2018 at 11AM virtual    Confirmed with patient if condition begins to worsen call PCP or go to the ER.  Patient was given the office number and encouraged to call back with question or concerns.  : yes

## 2018-06-24 ENCOUNTER — Ambulatory Visit (INDEPENDENT_AMBULATORY_CARE_PROVIDER_SITE_OTHER): Payer: Medicare Other | Admitting: Family Medicine

## 2018-06-24 ENCOUNTER — Encounter: Payer: Self-pay | Admitting: Family Medicine

## 2018-06-24 ENCOUNTER — Other Ambulatory Visit: Payer: Self-pay

## 2018-06-24 DIAGNOSIS — L03116 Cellulitis of left lower limb: Secondary | ICD-10-CM

## 2018-06-24 DIAGNOSIS — R600 Localized edema: Secondary | ICD-10-CM

## 2018-06-24 DIAGNOSIS — E1169 Type 2 diabetes mellitus with other specified complication: Secondary | ICD-10-CM

## 2018-06-24 DIAGNOSIS — Z6841 Body Mass Index (BMI) 40.0 and over, adult: Secondary | ICD-10-CM

## 2018-06-24 DIAGNOSIS — E669 Obesity, unspecified: Secondary | ICD-10-CM

## 2018-06-24 DIAGNOSIS — A419 Sepsis, unspecified organism: Secondary | ICD-10-CM | POA: Diagnosis not present

## 2018-06-24 DIAGNOSIS — E039 Hypothyroidism, unspecified: Secondary | ICD-10-CM

## 2018-06-24 LAB — CULTURE, BLOOD (ROUTINE X 2)
Culture: NO GROWTH
Culture: NO GROWTH
Special Requests: ADEQUATE

## 2018-06-24 NOTE — Progress Notes (Signed)
Subjective:    Patient ID: Cheyenne Guzman, female    DOB: 1965/05/12, 53 y.o.   MRN: [id number]  HPI Virtual Visit via Video Note  I connected with the patient on 06/24/18 at 11:00 AM EDT by a video enabled telemedicine application and verified that I am speaking with the correct person using two identifiers.  Location patient: home Location provider:work or home office Persons participating in the virtual visit: patient, provider  I discussed the limitations of evaluation and management by telemedicine and the availability of in person appointments. The patient expressed understanding and agreed to proceed.   HPI: Here for a transitional care visit to follow up a hospital stay from 06-19-18 to 06-22-18 for sepsis and left leg cellulitis. Several days prior to her admission a puppy jumped on her lap and scratched the left lower leg. Within 48 hours this became red and warm and swollen and painful, and her temp went up to 102 degrees. No chest pain or SOB. On admission her WBC was 10.2 and remained stable. Her renal function was stable with a creatinine of 1.02. Her diabetes was poorly controlled since the A1c was 9.0. She was treated with IV antibiotics and was sent home on a 10 day course of Keflex. Her blood cultures remained negative. She quickly improved and now the left leg appears completely normal. The pain and fever are gone, although she still has some fatigue.    ROS: See pertinent positives and negatives per HPI.  Past Medical History:  Diagnosis Date  . Anxiety    per pt. - mild   . Arthritis    back & ankles   . Asthma    seasonal   . Cancer [facility])    breast, sees Dr. Jana Hakim , Left - 11/2007  . Complication of anesthesia 2013   severe muscle spasms-sore-no doc  . Depression   . Diabetes mellitus    sees Dr. Chalmers Cater, diagnosed 2003  . HA (headache)   . Hearing loss    Went to [facility]  . Hyperlipidemia   . Kidney infection     - hosp. [facility]- septic- 10/2009  . Low back pain   . Peripheral neuropathy    R sided numbness- face & jaw  . Sleep apnea 9/15   severe-just started cpap-doing well  . TIA (transient ischemic attack)    in 2007,2008, and 2011  . Tinnitus of right ear   . Urosepsis 08-2009   with Klebsiella    Past Surgical History:  Procedure Laterality Date  . ABDOMINAL HYSTERECTOMY  08/2004  . BREAST BIOPSY  06/22/2011   Procedure: BREAST BIOPSY;  Surgeon: Stark Klein, MD;  Location: [place];  Service: General;  Laterality: Right;  . BREAST SURGERY  11/2007   left ductal carcinoma in situ  . CARPAL TUNNEL RELEASE Right 10/13/2013   Procedure: RIGHT CARPAL TUNNEL RELEASE;  Surgeon: Hessie Dibble, MD;  Location: [place];  Service: Orthopedics;  Laterality: Right;  . CARPAL TUNNEL RELEASE Left 11/24/2013   Procedure: LEFT CARPAL TUNNEL RELEASE WITH CYST EXCISION ;  Surgeon: Hessie Dibble, MD;  Location: [place];  Service: Orthopedics;  Laterality: Left;  . EAR CYST EXCISION Left 11/24/2013   Procedure: CYST REMOVAL;  Surgeon: Hessie Dibble, MD;  Location: [place];  Service: Orthopedics;  Laterality: Left;  . FOOT SURGERY  2000   plantar fasciitis-left  . LUMBAR DISC SURGERY  03-29-14   Fusion, revision 04-27-14 L4-5 Cauda Equina with graft  . NERVE GRAFT  06-01-08   cadaver graft to right inferior alveolar nerve  in [place]    Family History  Problem Relation Age of Onset  . Cancer Mother        breast  . Cancer Father        lung  . Diabetes Father   . Cancer Sister        colon  . Cancer Maternal Aunt        breast - both  . Diabetes Sister   . Anesthesia problems Neg Hx      Current Outpatient Medications:  .  albuterol (PROVENTIL HFA;VENTOLIN HFA) 108 (90 Base) MCG/ACT inhaler, Inhale 2 puffs into the lungs every 4 (four) hours as needed for wheezing or shortness of breath., Disp: 1 Inhaler, Rfl: 11 .  albuterol (PROVENTIL)  (2.5 MG/3ML) 0.083% nebulizer solution, 1 VIAL IN NEBULIZER EVERY 4 HOURS AS NEEDED FOR WHEEZING (Patient taking differently: Take 2.5 mg by nebulization every 4 (four) hours as needed for wheezing. ), Disp: 75 mL, Rfl: 1 .  ALPRAZolam (XANAX) 1 MG tablet, TAKE 1 TABLET BY MOUTH THREE TIMES A DAY (Patient taking differently: Take 1 mg by mouth 3 (three) times daily. ), Disp: 90 tablet, Rfl: 5 .  AZO-CRANBERRY PO, Take 2 tablets by mouth daily., Disp: , Rfl:  .  Blood Glucose Monitoring Suppl (CONTOUR NEXT MONITOR) w/Device KIT, 1 each by Does not apply route 2 (two) times daily. To check blood sugars twice a day., Disp: 1 kit, Rfl: 0 .  Buprenorphine 15 MCG/HR PTWK, Place 1 patch onto the skin every Tuesday. , Disp: , Rfl: 0 .  cephALEXin (KEFLEX) 500 MG capsule, Take 1 capsule (500 mg total) by mouth 3 (three) times daily for 10 days., Disp: 30 capsule, Rfl: 0 .  furosemide (LASIX) 40 MG tablet, TAKE 1 TABLET (40 MG TOTAL) BY MOUTH DAILY. AS NEEDED FOR SWELLING. (Patient taking differently: Take 40 mg by mouth daily. ), Disp: 30 tablet, Rfl: 6 .  gabapentin (NEURONTIN) 300 MG capsule, Take 300 mg by mouth 2 (two) times daily. , Disp: , Rfl:  .  glucose blood (CONTOUR NEXT TEST) test strip, Use to test blood sugar 2 times daily, Disp: 100 each, Rfl: 12 .  glucose blood test strip, Use as instructed, Disp: 100 each, Rfl: 0 .  Insulin Glargine (LANTUS SOLOSTAR) 100 UNIT/ML Solostar Pen, Inject 280 Units into the skin every morning. and pen needles 3/day (Patient taking differently: Inject 285 Units into the skin daily. and pen needles 3/day), Disp: 30 pen, Rfl: 11 .  levothyroxine (SYNTHROID, LEVOTHROID) 75 MCG tablet, Take 1 tablet (75 mcg total) by mouth daily., Disp: 90 tablet, Rfl: 3 .  methocarbamol (ROBAXIN) 750 MG tablet, Take 1 tablet (750 mg total) by mouth 2 (two) times daily. (Patient taking differently: Take 750 mg by mouth 3 (three) times daily. ), Disp: 60 tablet, Rfl: 5 .  rivaroxaban  (XARELTO) 20 MG TABS tablet, Take 20 mg by mouth daily., Disp: , Rfl:  .  sertraline (ZOLOFT) 100 MG tablet, TAKE 2 TABLETS (200 MG TOTAL) BY MOUTH AT BEDTIME. (Patient taking differently: Take 200 mg by mouth daily. ), Disp: 180 tablet, Rfl: 3  EXAM: SKIN: both lower legs show 3+ edema but there is no erythema at all  VITALS per patient if applicable:  GENERAL: alert, oriented, appears well and in no acute distress  HEENT:  atraumatic, conjunttiva clear, no obvious abnormalities on inspection of external nose and ears  NECK: normal movements of the head and neck  LUNGS: on inspection no signs of respiratory distress, breathing rate appears normal, no obvious gross SOB, gasping or wheezing  CV: no obvious cyanosis  MS: moves all visible extremities without noticeable abnormality  PSYCH/NEURO: pleasant and cooperative, no obvious depression or anxiety, speech and thought processing grossly intact  ASSESSMENT AND PLAN: She is recovering from a bout of sepsis that originated from a left leg cellulitis. She will finish out the Keflex. She will come by the lab next week to recheck a BMET and a thyroid panel.  [name], MD  Discussed the following assessment and plan:  No diagnosis found.     I discussed the assessment and treatment plan with the patient. The patient was provided an opportunity to ask questions and all were answered. The patient agreed with the plan and demonstrated an understanding of the instructions.   The patient was advised to call back or seek an in-person evaluation if the symptoms worsen or if the condition fails to improve as anticipated.     Review of Systems     Objective:   Physical Exam        Assessment & Plan:

## 2018-06-30 ENCOUNTER — Encounter: Payer: Self-pay | Admitting: Family Medicine

## 2018-06-30 NOTE — Telephone Encounter (Signed)
Dr. Fry please advise. Thanks  

## 2018-06-30 NOTE — Telephone Encounter (Signed)
My feeling on Xarelto is she should stay on this the rest of her life. If she wants to see Dr. Marin Olp instead of the [facility] provider, I can do a referral. She can set up her own mammogram. Her colonoscopy was in 2010, so yes it is time for another one. Doe she want to see [facility] again or [facility]?

## 2018-07-09 ENCOUNTER — Inpatient Hospital Stay (HOSPITAL_COMMUNITY)
Admission: EM | Admit: 2018-07-09 | Discharge: 2018-07-15 | DRG: 872 | Disposition: A | Discharge status: Home / Self Care | Payer: Medicare Other | Attending: Internal Medicine | Admitting: Internal Medicine

## 2018-07-09 ENCOUNTER — Observation Stay (HOSPITAL_COMMUNITY): Payer: Medicare Other

## 2018-07-09 ENCOUNTER — Other Ambulatory Visit: Payer: Self-pay

## 2018-07-09 DIAGNOSIS — E662 Morbid (severe) obesity with alveolar hypoventilation: Secondary | ICD-10-CM | POA: Diagnosis present

## 2018-07-09 DIAGNOSIS — Z86711 Personal history of pulmonary embolism: Secondary | ICD-10-CM

## 2018-07-09 DIAGNOSIS — R1030 Lower abdominal pain, unspecified: Secondary | ICD-10-CM | POA: Diagnosis not present

## 2018-07-09 DIAGNOSIS — H9190 Unspecified hearing loss, unspecified ear: Secondary | ICD-10-CM | POA: Diagnosis present

## 2018-07-09 DIAGNOSIS — M1990 Unspecified osteoarthritis, unspecified site: Secondary | ICD-10-CM | POA: Diagnosis present

## 2018-07-09 DIAGNOSIS — Z86718 Personal history of other venous thrombosis and embolism: Secondary | ICD-10-CM

## 2018-07-09 DIAGNOSIS — Z853 Personal history of malignant neoplasm of breast: Secondary | ICD-10-CM

## 2018-07-09 DIAGNOSIS — M069 Rheumatoid arthritis, unspecified: Secondary | ICD-10-CM | POA: Diagnosis present

## 2018-07-09 DIAGNOSIS — B373 Candidiasis of vulva and vagina: Secondary | ICD-10-CM | POA: Diagnosis present

## 2018-07-09 DIAGNOSIS — Z20828 Contact with and (suspected) exposure to other viral communicable diseases: Secondary | ICD-10-CM | POA: Diagnosis present

## 2018-07-09 DIAGNOSIS — A419 Sepsis, unspecified organism: Principal | ICD-10-CM | POA: Diagnosis present

## 2018-07-09 DIAGNOSIS — L0390 Cellulitis, unspecified: Secondary | ICD-10-CM

## 2018-07-09 DIAGNOSIS — G894 Chronic pain syndrome: Secondary | ICD-10-CM | POA: Diagnosis present

## 2018-07-09 DIAGNOSIS — R1031 Right lower quadrant pain: Secondary | ICD-10-CM

## 2018-07-09 DIAGNOSIS — N179 Acute kidney failure, unspecified: Secondary | ICD-10-CM | POA: Diagnosis present

## 2018-07-09 DIAGNOSIS — Z794 Long term (current) use of insulin: Secondary | ICD-10-CM

## 2018-07-09 DIAGNOSIS — Z885 Allergy status to narcotic agent status: Secondary | ICD-10-CM

## 2018-07-09 DIAGNOSIS — F329 Major depressive disorder, single episode, unspecified: Secondary | ICD-10-CM | POA: Diagnosis present

## 2018-07-09 DIAGNOSIS — Z87440 Personal history of urinary (tract) infections: Secondary | ICD-10-CM

## 2018-07-09 DIAGNOSIS — E785 Hyperlipidemia, unspecified: Secondary | ICD-10-CM | POA: Diagnosis present

## 2018-07-09 DIAGNOSIS — M79659 Pain in unspecified thigh: Secondary | ICD-10-CM

## 2018-07-09 DIAGNOSIS — I2699 Other pulmonary embolism without acute cor pulmonale: Secondary | ICD-10-CM | POA: Diagnosis present

## 2018-07-09 DIAGNOSIS — Z6841 Body Mass Index (BMI) 40.0 and over, adult: Secondary | ICD-10-CM

## 2018-07-09 DIAGNOSIS — E039 Hypothyroidism, unspecified: Secondary | ICD-10-CM | POA: Diagnosis present

## 2018-07-09 DIAGNOSIS — I890 Lymphedema, not elsewhere classified: Secondary | ICD-10-CM | POA: Diagnosis present

## 2018-07-09 DIAGNOSIS — E1142 Type 2 diabetes mellitus with diabetic polyneuropathy: Secondary | ICD-10-CM | POA: Diagnosis present

## 2018-07-09 DIAGNOSIS — Z87891 Personal history of nicotine dependence: Secondary | ICD-10-CM

## 2018-07-09 DIAGNOSIS — F419 Anxiety disorder, unspecified: Secondary | ICD-10-CM | POA: Diagnosis present

## 2018-07-09 DIAGNOSIS — Z7901 Long term (current) use of anticoagulants: Secondary | ICD-10-CM

## 2018-07-09 DIAGNOSIS — Z79899 Other long term (current) drug therapy: Secondary | ICD-10-CM

## 2018-07-09 DIAGNOSIS — L03116 Cellulitis of left lower limb: Secondary | ICD-10-CM | POA: Diagnosis present

## 2018-07-09 DIAGNOSIS — Z8673 Personal history of transient ischemic attack (TIA), and cerebral infarction without residual deficits: Secondary | ICD-10-CM

## 2018-07-09 DIAGNOSIS — R600 Localized edema: Secondary | ICD-10-CM | POA: Diagnosis present

## 2018-07-09 DIAGNOSIS — E1169 Type 2 diabetes mellitus with other specified complication: Secondary | ICD-10-CM | POA: Diagnosis present

## 2018-07-09 DIAGNOSIS — J45909 Unspecified asthma, uncomplicated: Secondary | ICD-10-CM | POA: Diagnosis present

## 2018-07-09 LAB — CBC WITH DIFFERENTIAL/PLATELET
Abs Immature Granulocytes: 0.08 10*3/uL — ABNORMAL HIGH (ref 0.00–0.07)
Basophils Absolute: 0 10*3/uL (ref 0.0–0.1)
Basophils Relative: 0 %
Eosinophils Absolute: 0.2 10*3/uL (ref 0.0–0.5)
Eosinophils Relative: 1 %
HCT: 47.3 % — ABNORMAL HIGH (ref 36.0–46.0)
Hemoglobin: 14.2 g/dL (ref 12.0–15.0)
Immature Granulocytes: 1 %
Lymphocytes Relative: 5 %
Lymphs Abs: 0.6 10*3/uL — ABNORMAL LOW (ref 0.7–4.0)
MCH: 24.1 pg — ABNORMAL LOW (ref 26.0–34.0)
MCHC: 30 g/dL (ref 30.0–36.0)
MCV: 80.2 fL (ref 80.0–100.0)
Monocytes Absolute: 0.6 10*3/uL (ref 0.1–1.0)
Monocytes Relative: 5 %
Neutro Abs: 11.5 10*3/uL — ABNORMAL HIGH (ref 1.7–7.7)
Neutrophils Relative %: 88 %
Platelets: UNDETERMINED 10*3/uL (ref 150–400)
RBC: 5.9 MIL/uL — ABNORMAL HIGH (ref 3.87–5.11)
RDW: 17.1 % — ABNORMAL HIGH (ref 11.5–15.5)
WBC: 13 10*3/uL — ABNORMAL HIGH (ref 4.0–10.5)
nRBC: 0 % (ref 0.0–0.2)

## 2018-07-09 LAB — BASIC METABOLIC PANEL
Anion gap: 13 (ref 5–15)
BUN: 14 mg/dL (ref 6–20)
CO2: 21 mmol/L — ABNORMAL LOW (ref 22–32)
Calcium: 9.2 mg/dL (ref 8.9–10.3)
Chloride: 102 mmol/L (ref 98–111)
Creatinine, Ser: 1.21 mg/dL — ABNORMAL HIGH (ref 0.44–1.00)
GFR calc Af Amer: 59 mL/min — ABNORMAL LOW (ref >60)
GFR calc non Af Amer: 51 mL/min — ABNORMAL LOW (ref >60)
Glucose, Bld: 190 mg/dL — ABNORMAL HIGH (ref 70–99)
Potassium: 5.1 mmol/L (ref 3.5–5.1)
Sodium: 136 mmol/L (ref 135–145)

## 2018-07-09 LAB — LACTIC ACID, PLASMA: Lactic Acid, Venous: 2.2 mmol/L (ref 0.5–1.9)

## 2018-07-09 LAB — GLUCOSE, CAPILLARY: Glucose-Capillary: 158 mg/dL — ABNORMAL HIGH (ref 70–99)

## 2018-07-09 MED ORDER — SODIUM CHLORIDE 0.9 % IV BOLUS
1000.0000 mL | Freq: Once | INTRAVENOUS | Status: AC
  Administered 2018-07-09: 1000 mL via INTRAVENOUS

## 2018-07-09 MED ORDER — RIVAROXABAN 20 MG PO TABS
20.0000 mg | ORAL_TABLET | Freq: Every day | ORAL | Status: DC
  Administered 2018-07-09 – 2018-07-14 (×6): 20 mg via ORAL
  Filled 2018-07-09 (×7): qty 1

## 2018-07-09 MED ORDER — GABAPENTIN 300 MG PO CAPS
300.0000 mg | ORAL_CAPSULE | Freq: Two times a day (BID) | ORAL | Status: DC
  Administered 2018-07-09 – 2018-07-15 (×12): 300 mg via ORAL
  Filled 2018-07-09 (×12): qty 1

## 2018-07-09 MED ORDER — SODIUM CHLORIDE 0.9 % IV SOLN
1.0000 g | Freq: Once | INTRAVENOUS | Status: AC
  Administered 2018-07-10: 1 g via INTRAVENOUS
  Filled 2018-07-09: qty 1

## 2018-07-09 MED ORDER — LEVOTHYROXINE SODIUM 75 MCG PO TABS
75.0000 ug | ORAL_TABLET | Freq: Every day | ORAL | Status: DC
  Administered 2018-07-10 – 2018-07-15 (×6): 75 ug via ORAL
  Filled 2018-07-09 (×6): qty 1

## 2018-07-09 MED ORDER — SODIUM CHLORIDE 0.9 % IV BOLUS
500.0000 mL | Freq: Once | INTRAVENOUS | Status: AC
  Administered 2018-07-10: 500 mL via INTRAVENOUS

## 2018-07-09 MED ORDER — INSULIN ASPART 100 UNIT/ML ~~LOC~~ SOLN
0.0000 [IU] | Freq: Three times a day (TID) | SUBCUTANEOUS | Status: DC
  Administered 2018-07-10: 5 [IU] via SUBCUTANEOUS
  Administered 2018-07-10 – 2018-07-12 (×6): 3 [IU] via SUBCUTANEOUS
  Administered 2018-07-13 (×2): 2 [IU] via SUBCUTANEOUS
  Administered 2018-07-14 (×2): 3 [IU] via SUBCUTANEOUS
  Administered 2018-07-15: 2 [IU] via SUBCUTANEOUS

## 2018-07-09 MED ORDER — SODIUM CHLORIDE 0.9 % IV SOLN
INTRAVENOUS | Status: DC
  Administered 2018-07-10: 01:00:00 via INTRAVENOUS

## 2018-07-09 MED ORDER — SODIUM CHLORIDE 0.9 % IV SOLN
1.0000 g | Freq: Once | INTRAVENOUS | Status: AC
  Administered 2018-07-09: 21:00:00 1 g via INTRAVENOUS
  Filled 2018-07-09: qty 10

## 2018-07-09 MED ORDER — SODIUM CHLORIDE 0.9 % IV SOLN: 1.0000 g | INTRAVENOUS | Status: DC

## 2018-07-09 MED ORDER — SERTRALINE HCL 100 MG PO TABS
200.0000 mg | ORAL_TABLET | Freq: Every day | ORAL | Status: DC
  Administered 2018-07-09 – 2018-07-15 (×7): 200 mg via ORAL
  Filled 2018-07-09 (×7): qty 2

## 2018-07-09 MED ORDER — ALBUTEROL SULFATE (2.5 MG/3ML) 0.083% IN NEBU: 2.5000 mg | INHALATION_SOLUTION | RESPIRATORY_TRACT | Status: DC | PRN

## 2018-07-09 MED ORDER — INSULIN ASPART 100 UNIT/ML ~~LOC~~ SOLN
0.0000 [IU] | Freq: Every day | SUBCUTANEOUS | Status: DC
  Administered 2018-07-12: 3 [IU] via SUBCUTANEOUS
  Administered 2018-07-13: 2 [IU] via SUBCUTANEOUS
  Administered 2018-07-15: 3 [IU] via SUBCUTANEOUS

## 2018-07-09 MED ORDER — ACETAMINOPHEN 325 MG PO TABS
650.0000 mg | ORAL_TABLET | Freq: Once | ORAL | Status: AC
  Administered 2018-07-09: 650 mg via ORAL
  Filled 2018-07-09: qty 2

## 2018-07-09 MED ORDER — SODIUM CHLORIDE 0.9 % IV SOLN
2.0000 g | INTRAVENOUS | Status: DC
  Filled 2018-07-09: qty 20

## 2018-07-09 MED ORDER — METHOCARBAMOL 500 MG PO TABS
750.0000 mg | ORAL_TABLET | Freq: Two times a day (BID) | ORAL | Status: DC
  Administered 2018-07-09 – 2018-07-15 (×12): 750 mg via ORAL
  Filled 2018-07-09 (×12): qty 2

## 2018-07-09 MED ORDER — INSULIN GLARGINE 100 UNIT/ML ~~LOC~~ SOLN
285.0000 [IU] | Freq: Every day | SUBCUTANEOUS | Status: DC
  Administered 2018-07-10: 285 [IU] via SUBCUTANEOUS
  Filled 2018-07-09 (×2): qty 2.85

## 2018-07-09 MED ORDER — KETOROLAC TROMETHAMINE 30 MG/ML IJ SOLN: 30.0000 mg | Freq: Four times a day (QID) | INTRAMUSCULAR | Status: DC | PRN

## 2018-07-09 MED ORDER — ACETAMINOPHEN 325 MG PO TABS
650.0000 mg | ORAL_TABLET | Freq: Four times a day (QID) | ORAL | Status: DC | PRN
  Administered 2018-07-10 – 2018-07-14 (×9): 650 mg via ORAL
  Filled 2018-07-09 (×9): qty 2

## 2018-07-09 NOTE — ED Notes (Signed)
ED TO INPATIENT HANDOFF REPORT  ED Nurse Name and Phone #: Benjamine Mola [phone]  S Name/Age/Gender Cheyenne Guzman 53 y.o. female Room/Bed: 043C/043C  Code Status   Code Status: Full Code  Home/SNF/Other Home Patient oriented to: self, place, time and situation Is this baseline? Yes   Triage Complete: Triage complete  Chief Complaint Right groin pain   Triage Note Patient presents to the ED with LLE Cellulitis and right groin pain.  Patient D/C from hospital several days ago for the same complaint   Allergies Allergies  Allergen Reactions  . Vicodin [Hydrocodone-Acetaminophen] Nausea And Vomiting    Level of Care/Admitting Diagnosis ED Disposition    ED Disposition Condition [facility]: [place] [[id number]]  Level of Care: Med-Surg [16]  I expect the patient will be discharged within 24 hours: Yes  LOW acuity---Tx typically complete <24 hrs---ACUTE conditions typically can be evaluated <24 hours---LABS likely to return to acceptable levels <24 hours---IS near functional baseline---EXPECTED to return to current living arrangement---NOT newly hypoxic: Meets criteria for 5C-Observation unit  Covid Evaluation: Person Under Investigation (PUI)  Isolation Risk Level: Low Risk/Droplet (Less than 4L [place] supplementation)  Diagnosis: Cellulitis [347425]  Admitting Physician: Shela Leff [[id number]]  Attending Physician: Shela Leff [[id number]]  PT Class (Do Not Modify): Observation [104]  PT Acc Code (Do Not Modify): Observation [10022]       B Medical/Surgery History Past Medical History:  Diagnosis Date  . Anxiety    per pt. - mild   . Arthritis    back & ankles   . Asthma    seasonal   . Cancer [facility])    breast, sees Dr. Jana Hakim , Left - 11/2007  . Complication of anesthesia 2013   severe muscle spasms-sore-no doc  . Depression   . Diabetes mellitus    sees Dr. Chalmers Cater, diagnosed 2003  . HA (headache)   . Hearing  loss    Went to [facility]  . Hyperlipidemia   . Kidney infection    - hosp. [facility]- septic- 10/2009  . Low back pain   . Peripheral neuropathy    R sided numbness- face & jaw  . Sleep apnea 9/15   severe-just started cpap-doing well  . TIA (transient ischemic attack)    in 2007,2008, and 2011  . Tinnitus of right ear   . Urosepsis 08-2009   with Klebsiella   Past Surgical History:  Procedure Laterality Date  . ABDOMINAL HYSTERECTOMY  08/2004  . BREAST BIOPSY  06/22/2011   Procedure: BREAST BIOPSY;  Surgeon: Stark Klein, MD;  Location: [place];  Service: General;  Laterality: Right;  . BREAST SURGERY  11/2007   left ductal carcinoma in situ  . CARPAL TUNNEL RELEASE Right 10/13/2013   Procedure: RIGHT CARPAL TUNNEL RELEASE;  Surgeon: Hessie Dibble, MD;  Location: [place];  Service: Orthopedics;  Laterality: Right;  . CARPAL TUNNEL RELEASE Left 11/24/2013   Procedure: LEFT CARPAL TUNNEL RELEASE WITH CYST EXCISION ;  Surgeon: Hessie Dibble, MD;  Location: [place];  Service: Orthopedics;  Laterality: Left;  . EAR CYST EXCISION Left 11/24/2013   Procedure: CYST REMOVAL;  Surgeon: Hessie Dibble, MD;  Location: [place];  Service: Orthopedics;  Laterality: Left;  . FOOT SURGERY  2000   plantar fasciitis-left  . LUMBAR DISC SURGERY  03-29-14   Fusion, revision 04-27-14 L4-5 Cauda Equina with graft  .  NERVE GRAFT  06-01-08   cadaver graft to right inferior alveolar nerve  in [place]     A IV Location/Drains/Wounds Patient Lines/Drains/Airways Status   Active Line/Drains/Airways    Name:   Placement date:   Placement time:   Site:   Days:   Peripheral IV 07/09/18 Left Forearm   07/09/18    2040    Forearm   less than 1          Intake/Output Last 24 hours  Intake/Output Summary (Last 24 hours) at 07/09/2018 2231 Last data filed at 07/09/2018 2125 Gross per 24 hour  Intake 94.63 ml   Output -  Net 94.63 ml    Labs/Imaging Results for orders placed or performed during the hospital encounter of 07/09/18 (from the past 48 hour(s))  CBC with Differential     Status: Abnormal   Collection Time: 07/09/18  8:30 PM  Result Value Ref Range   WBC 13.0 (H) 4.0 - 10.5 K/uL    Comment: WHITE COUNT CONFIRMED ON SMEAR   RBC 5.90 (H) 3.87 - 5.11 MIL/uL   Hemoglobin 14.2 12.0 - 15.0 g/dL   HCT 47.3 (H) 36.0 - 46.0 %   MCV 80.2 80.0 - 100.0 fL   MCH 24.1 (L) 26.0 - 34.0 pg   MCHC 30.0 30.0 - 36.0 g/dL   RDW 17.1 (H) 11.5 - 15.5 %   Platelets PLATELET CLUMPS NOTED ON SMEAR, UNABLE TO ESTIMATE 150 - 400 K/uL   nRBC 0.0 0.0 - 0.2 %   Neutrophils Relative % 88 %   Neutro Abs 11.5 (H) 1.7 - 7.7 K/uL   Lymphocytes Relative 5 %   Lymphs Abs 0.6 (L) 0.7 - 4.0 K/uL   Monocytes Relative 5 %   Monocytes Absolute 0.6 0.1 - 1.0 K/uL   Eosinophils Relative 1 %   Eosinophils Absolute 0.2 0.0 - 0.5 K/uL   Basophils Relative 0 %   Basophils Absolute 0.0 0.0 - 0.1 K/uL   WBC Morphology VACUOLATED NEUTROPHILS    Immature Granulocytes 1 %   Abs Immature Granulocytes 0.08 (H) 0.00 - 0.07 K/uL    Comment: Performed at [facility], [address] [place], [place] [address]  Basic metabolic panel     Status: Abnormal   Collection Time: 07/09/18  8:30 PM  Result Value Ref Range   Sodium 136 135 - 145 mmol/L   Potassium 5.1 3.5 - 5.1 mmol/L   Chloride 102 98 - 111 mmol/L   CO2 21 (L) 22 - 32 mmol/L   Glucose, Bld 190 (H) 70 - 99 mg/dL   BUN 14 6 - 20 mg/dL   Creatinine, Ser 1.21 (H) 0.44 - 1.00 mg/dL   Calcium 9.2 8.9 - 10.3 mg/dL   GFR calc non Af Amer 51 (L) >60 mL/min   GFR calc Af Amer 59 (L) >60 mL/min   Anion gap 13 5 - 15    Comment: Performed at [facility], [place] [address], [place], [place] [address]   No results found.  Pending Labs Unresulted Labs (From admission, onward)    Start     Ordered   07/10/18 0500  CBC  Tomorrow morning,   R     07/09/18 2210    07/10/18 7408  Basic metabolic panel  Tomorrow morning,   R     07/09/18 2210   07/09/18 2211  Lactic acid, plasma  Add-on,   AD     07/09/18 2210  07/09/18 2158  Culture, blood (routine x 2)  BLOOD CULTURE X 2,   R (with STAT occurrences)     07/09/18 2157   07/09/18 2008  Novel Coronavirus,NAA,(SEND-OUT TO REF LAB - TAT 24-48 hrs); Hosp Order  (Asymptomatic Patients Labs)  Once,   STAT    Question:  Rule Out  Answer:  Yes   07/09/18 2007          Vitals/Pain Today's Vitals   07/09/18 2016 07/09/18 2020 07/09/18 2030 07/09/18 2102  BP: 132/67  (!) 123/91   Pulse:  100 (!) 101   Resp:      Temp:      TempSrc:      SpO2:  95% 95%   Weight:      Height:      PainSc:    5     Isolation Precautions No active isolations  Medications Medications  acetaminophen (TYLENOL) tablet 650 mg (has no administration in time range)  sertraline (ZOLOFT) tablet 200 mg (has no administration in time range)  Insulin Glargine (LANTUS) Solostar Pen 285 Units (has no administration in time range)  levothyroxine (SYNTHROID) tablet 75 mcg (has no administration in time range)  rivaroxaban (XARELTO) tablet 20 mg (has no administration in time range)  gabapentin (NEURONTIN) capsule 300 mg (has no administration in time range)  methocarbamol (ROBAXIN) tablet 750 mg (has no administration in time range)  albuterol (VENTOLIN HFA) 108 (90 Base) MCG/ACT inhaler 2 puff (has no administration in time range)  ketorolac (TORADOL) 30 MG/ML injection 30 mg (has no administration in time range)  insulin aspart (novoLOG) injection 0-15 Units (has no administration in time range)  insulin aspart (novoLOG) injection 0-5 Units (has no administration in time range)  cefTRIAXone (ROCEPHIN) 2 g in sodium chloride 0.9 % 100 mL IVPB (has no administration in time range)  cefTRIAXone (ROCEPHIN) 1 g in sodium chloride 0.9 % 100 mL IVPB (has no administration in time range)  acetaminophen (TYLENOL) tablet 650 mg (650 mg  Oral Given 07/09/18 2045)  cefTRIAXone (ROCEPHIN) 1 g in sodium chloride 0.9 % 100 mL IVPB (0 g Intravenous Stopped 07/09/18 2125)  sodium chloride 0.9 % bolus 1,000 mL (1,000 mLs Intravenous New Bag/Given 07/09/18 2055)    Mobility walks with person assist Low fall risk   Focused Assessments Skin   R Recommendations: See Admitting Provider Note  Report given to:   Additional Notes:

## 2018-07-09 NOTE — ED Triage Notes (Signed)
Patient presents to the ED with LLE Cellulitis and right groin pain.  Patient D/C from hospital several days ago for the same complaint

## 2018-07-09 NOTE — ED Notes (Signed)
Pt requesting to be in a recliner instead of stretcher. Stretcher removed from room and recliner in room. Pt states the recliner was more comfortable and the she had lots of sores from the stretcher from her recent visit.

## 2018-07-09 NOTE — ED Provider Notes (Signed)
Care assumed from [place], please see her note for full details, but in brief Cheyenne Guzman is a 53 y.o. female who presents with cellulitis to the left lower extremity, history of the same in May that required admission and IV antibiotics.  On arrival she was febrile and tachycardic but blood pressures have remained stable.  Labs significant for leukocytosis.  Patient started on IV Rocephin.  At handoff pending admission to hospitalist service.  Case discussed with Dr. Marlowe Sax with [facility] hospitalist who will see and admit the patient.  Labs Reviewed  CBC WITH DIFFERENTIAL/PLATELET - Abnormal; Notable for the following components:      Result Value   WBC 13.0 (*)    RBC 5.90 (*)    HCT 47.3 (*)    MCH 24.1 (*)    RDW 17.1 (*)    Neutro Abs 11.5 (*)    Lymphs Abs 0.6 (*)    Abs Immature Granulocytes 0.08 (*)    All other components within normal limits  BASIC METABOLIC PANEL - Abnormal; Notable for the following components:   CO2 21 (*)    Glucose, Bld 190 (*)    Creatinine, Ser 1.21 (*)    GFR calc non Af Amer 51 (*)    GFR calc Af Amer 59 (*)    All other components within normal limits  NOVEL CORONAVIRUS, NAA (HOSPITAL ORDER, SEND-OUT TO REF LAB)  CULTURE, BLOOD (ROUTINE X 2)  CULTURE, BLOOD (ROUTINE X 2)  CBC  BASIC METABOLIC PANEL  LACTIC ACID, PLASMA   Medications  acetaminophen (TYLENOL) tablet 650 mg (650 mg Oral Given 07/09/18 2045)  cefTRIAXone (ROCEPHIN) 1 g in sodium chloride 0.9 % 100 mL IVPB (0 g Intravenous Stopped 07/09/18 2125)  sodium chloride 0.9 % bolus 1,000 mL (1,000 mLs Intravenous New Bag/Given 07/09/18 2055)     Final diagnoses:  Cellulitis of left lower extremity  Groin pain      Janet Berlin 07/09/18 2254    Lacretia Leigh, MD 07/10/18 2257

## 2018-07-09 NOTE — ED Notes (Signed)
Pt has been in contact with family members.

## 2018-07-09 NOTE — ED Notes (Signed)
Patient transported to X-ray 

## 2018-07-09 NOTE — ED Provider Notes (Addendum)
[place] EMERGENCY DEPARTMENT Provider Note   CSN: [id number] Arrival date & time: 07/09/18  1813     History   Chief Complaint Chief Complaint  Patient presents with  . Recurrent Skin Infections    HPI Cheyenne Guzman is a 53 y.o. female who presents with a fever and L leg pain/swelling. PMH significant for morbid obesity, chronic lower leg edema, RA, frequent UTI, diabetes, hx of breast cancer, hx of DVT/PE. She was recently admitted for sepsis due to cellulitis which she thinks was from a dog scratch on her leg. She finished a 10 day course of antibiotics. She states that she "felt great" after the antibiotics (Keflex) and was back to normal. However over the past day she started to develop generalized malaise and was feeling run down. She started to have acute left leg pain that feels like a soreness. She states that her symptoms are a "carbon copy" of how she felt before she was admitted last time. She reports associated fever of 101. No recent trauma or sore on the leg. She is also having some right groin pain that is new. She was doing water exercises in her home pool yesterday but doesn't recall a moment when she injured herself. No URI symptoms, chest pain, SOB, abdominal pain. She has had nausea without vomiting. No diarrhea or urinary symptoms. Blood cultures were negative during her last hospitalization.      HPI  Past Medical History:  Diagnosis Date  . Anxiety    per pt. - mild   . Arthritis    back & ankles   . Asthma    seasonal   . Cancer [facility])    breast, sees Dr. Jana Hakim , Left - 11/2007  . Complication of anesthesia 2013   severe muscle spasms-sore-no doc  . Depression   . Diabetes mellitus    sees Dr. Chalmers Cater, diagnosed 2003  . HA (headache)   . Hearing loss    Went to [facility]  . Hyperlipidemia   . Kidney infection    - hosp. [facility]- septic- 10/2009  . Low back pain   . Peripheral neuropathy    R sided numbness- face & jaw  . Sleep apnea 9/15   severe-just started cpap-doing well  . TIA (transient ischemic attack)    in 2007,2008, and 2011  . Tinnitus of right ear   . Urosepsis 08-2009   with Klebsiella    Patient Active Problem List   Diagnosis Date Noted  . Morbid obesity with body mass index of 60.0-69.9 in adult [facility])   . Sepsis ([place]) 06/19/2018  . Chronic pain 06/19/2018  . Pulmonary embolism ([place]) 03/12/2018  . Diabetes mellitus type 2 in obese ([place]) 03/12/2018  . Acute pulmonary embolism ([place]) 03/12/2018  . Acute viral syndrome 09/05/2017  . Hypothyroidism 08/14/2017  . Edema of both legs 10/18/2015  . Migraine 06/16/2014  . Lumbar radiculopathy 03/18/2014  . Diabetic neuropathy ([place]) 01/07/2014  . Congenital spondylolysis of lumbosacral region 11/02/2013  . SPL (spondylolisthesis) 10/08/2013  . Chronic LBP 10/08/2013  . Obstructive sleep apnea 07/31/2013  . HA (headache)   . Tinnitus 06/23/2013  . Hearing loss 06/23/2013  . Headache(784.0) 06/23/2013  . Low back pain 06/10/2013  . SOB (shortness of breath) 11/27/2012  . Cellulitis of left lower extremity 07/03/2012  . DCIS (ductal carcinoma in situ) of breast, left, treated with [facility] 11/2007 02/19/2011  . BACK PAIN, LUMBAR 10/13/2009  . Asthma  02/20/2008  . Hyperlipidemia associated with type 2 diabetes mellitus ([place]) 11/26/2006  . Anxiety state 11/26/2006  . Anxiety and depression 11/26/2006    Past Surgical History:  Procedure Laterality Date  . ABDOMINAL HYSTERECTOMY  08/2004  . BREAST BIOPSY  06/22/2011   Procedure: BREAST BIOPSY;  Surgeon: Stark Klein, MD;  Location: [place];  Service: General;  Laterality: Right;  . BREAST SURGERY  11/2007   left ductal carcinoma in situ  . CARPAL TUNNEL RELEASE Right 10/13/2013   Procedure: RIGHT CARPAL TUNNEL RELEASE;  Surgeon: Hessie Dibble, MD;  Location: [place];  Service: Orthopedics;  Laterality: Right;  . CARPAL TUNNEL RELEASE Left  11/24/2013   Procedure: LEFT CARPAL TUNNEL RELEASE WITH CYST EXCISION ;  Surgeon: Hessie Dibble, MD;  Location: [place];  Service: Orthopedics;  Laterality: Left;  . EAR CYST EXCISION Left 11/24/2013   Procedure: CYST REMOVAL;  Surgeon: Hessie Dibble, MD;  Location: [place];  Service: Orthopedics;  Laterality: Left;  . FOOT SURGERY  2000   plantar fasciitis-left  . LUMBAR DISC SURGERY  03-29-14   Fusion, revision 04-27-14 L4-5 Cauda Equina with graft  . NERVE GRAFT  06-01-08   cadaver graft to right inferior alveolar nerve  in [place]     OB History   No obstetric history on file.      Home Medications    Prior to Admission medications   Medication Sig Start Date End Date Taking? Authorizing Provider  albuterol (PROVENTIL HFA;VENTOLIN HFA) 108 (90 Base) MCG/ACT inhaler Inhale 2 puffs into the lungs every 4 (four) hours as needed for wheezing or shortness of breath. 04/03/17   Laurey Morale, MD  albuterol (PROVENTIL) (2.5 MG/3ML) 0.083% nebulizer solution 1 VIAL IN NEBULIZER EVERY 4 HOURS AS NEEDED FOR WHEEZING Patient taking differently: Take 2.5 mg by nebulization every 4 (four) hours as needed for wheezing.  10/13/13   Laurey Morale, MD  ALPRAZolam Duanne Moron) 1 MG tablet TAKE 1 TABLET BY MOUTH THREE TIMES A DAY Patient taking differently: Take 1 mg by mouth 3 (three) times daily.  03/06/18   Laurey Morale, MD  AZO-CRANBERRY PO Take 2 tablets by mouth daily.    [provider]  Blood Glucose Monitoring Suppl (CONTOUR NEXT MONITOR) w/Device KIT 1 each by Does not apply route 2 (two) times daily. To check blood sugars twice a day. 08/27/16   Renato Shin, MD  Buprenorphine 15 MCG/HR PTWK Place 1 patch onto the skin every Tuesday.  07/23/17   [provider]  furosemide (LASIX) 40 MG tablet TAKE 1 TABLET (40 MG TOTAL) BY MOUTH DAILY. AS NEEDED FOR SWELLING. Patient taking differently: Take 40 mg by mouth daily.  02/28/16   Laurey Morale,  MD  gabapentin (NEURONTIN) 300 MG capsule Take 300 mg by mouth 2 (two) times daily.     [provider]  glucose blood (CONTOUR NEXT TEST) test strip Use to test blood sugar 2 times daily 12/18/16   Renato Shin, MD  glucose blood test strip Use as instructed 08/24/16   Renato Shin, MD  Insulin Glargine (LANTUS SOLOSTAR) 100 UNIT/ML Solostar Pen Inject 280 Units into the skin every morning. and pen needles 3/day Patient taking differently: Inject 285 Units into the skin daily. and pen needles 3/day 10/16/17   Renato Shin, MD  levothyroxine (SYNTHROID, LEVOTHROID) 75 MCG tablet Take 1 tablet (75 mcg total) by mouth daily. 05/28/17   [name]  A, MD  methocarbamol (ROBAXIN) 750 MG tablet Take 1 tablet (750 mg total) by mouth 2 (two) times daily. Patient taking differently: Take 750 mg by mouth 3 (three) times daily.  02/03/15   Laurey Morale, MD  rivaroxaban (XARELTO) 20 MG TABS tablet Take 20 mg by mouth daily. 05/22/18   [provider]  sertraline (ZOLOFT) 100 MG tablet TAKE 2 TABLETS (200 MG TOTAL) BY MOUTH AT BEDTIME. Patient taking differently: Take 200 mg by mouth daily.  05/15/17   Laurey Morale, MD    Family History Family History  Problem Relation Age of Onset  . Cancer Mother        breast  . Cancer Father        lung  . Diabetes Father   . Cancer Sister        colon  . Cancer Maternal Aunt        breast - both  . Diabetes Sister   . Anesthesia problems Neg Hx     Social History Social History   Tobacco Use  . Smoking status: Former Smoker    Quit date: 02/18/1997    Years since quitting: 21.4  . Smokeless tobacco: Never Used  Substance Use Topics  . Alcohol use: Yes    Comment: rare  . Drug use: No     Allergies   Vicodin [hydrocodone-acetaminophen]   Review of Systems Review of Systems  Constitutional: Positive for fever.  Respiratory: Negative for cough and shortness of breath.   Cardiovascular: Negative for chest pain.   Gastrointestinal: Positive for nausea. Negative for abdominal pain, diarrhea and vomiting.  Genitourinary: Negative for dysuria.  Musculoskeletal: Positive for arthralgias and myalgias. Negative for neck pain.  Skin: Positive for color change.  Allergic/Immunologic: Positive for immunocompromised state.  All other systems reviewed and are negative.    Physical Exam Updated Vital Signs BP (!) 143/56 (BP Location: Right Arm)   Pulse (!) 112   Temp (!) 102.4 F (39.1 C) (Oral)   Resp 20   Ht '5\' 7"'  (1.702 m)   Wt (!) 181.4 kg   SpO2 97%   BMI 62.65 kg/m   Physical Exam Vitals signs and nursing note reviewed.  Constitutional:      General: She is not in acute distress.    Appearance: She is well-developed. She is obese. She is not ill-appearing.     Comments: Calm, cooperative. Appears uncomfortable. Sitting in recliner chair  HENT:     Head: Normocephalic and atraumatic.  Eyes:     General: No scleral icterus.       Right eye: No discharge.        Left eye: No discharge.     Conjunctiva/sclera: Conjunctivae normal.     Pupils: Pupils are equal, round, and reactive to light.  Neck:     Musculoskeletal: Normal range of motion.  Cardiovascular:     Rate and Rhythm: Regular rhythm. Tachycardia present.  Pulmonary:     Effort: Pulmonary effort is normal. No respiratory distress.     Breath sounds: Normal breath sounds.  Abdominal:     General: There is no distension.     Palpations: Abdomen is soft.     Tenderness: There is no abdominal tenderness.  Musculoskeletal:     Comments: Right lower extremity: Tenderness over the right groin. Skin changes from chronic leg edema. 1+DP pulse  Left lower extremity: Redness, swelling, warmth from the foot to the mid-shin. Tenderness over the area. 1+ DP  pulse.   Skin:    General: Skin is warm and dry.  Neurological:     Mental Status: She is alert and oriented to person, place, and time.  Psychiatric:        Behavior: Behavior  normal.      ED Treatments / Results  Labs (all labs ordered are listed, but only abnormal results are displayed) Labs Reviewed  CBC WITH DIFFERENTIAL/PLATELET - Abnormal; Notable for the following components:      Result Value   WBC 13.0 (*)    RBC 5.90 (*)    HCT 47.3 (*)    MCH 24.1 (*)    RDW 17.1 (*)    Neutro Abs 11.5 (*)    Lymphs Abs 0.6 (*)    Abs Immature Granulocytes 0.08 (*)    All other components within normal limits  BASIC METABOLIC PANEL - Abnormal; Notable for the following components:   CO2 21 (*)    Glucose, Bld 190 (*)    Creatinine, Ser 1.21 (*)    GFR calc non Af Amer 51 (*)    GFR calc Af Amer 59 (*)    All other components within normal limits  NOVEL CORONAVIRUS, NAA (HOSPITAL ORDER, SEND-OUT TO REF LAB)    EKG None  Radiology No results found.  Procedures Procedures (including critical care time)  Medications Ordered in ED Medications  cefTRIAXone (ROCEPHIN) 1 g in sodium chloride 0.9 % 100 mL IVPB (1 g Intravenous New Bag/Given 07/09/18 2055)  acetaminophen (TYLENOL) tablet 650 mg (650 mg Oral Given 07/09/18 2045)  sodium chloride 0.9 % bolus 1,000 mL (1,000 mLs Intravenous New Bag/Given 07/09/18 2055)     Initial Impression / Assessment and Plan / ED Course  I have reviewed the triage vital signs and the nursing notes.  Pertinent labs & imaging results that were available during my care of the patient were reviewed by me and considered in my medical decision making (see chart for details).  53 year old female presents with acute onset of left leg pain, swelling, redness since today along with fever and generalized malaise. She is an uncontrolled diabetic and has morbid obesity which is likely contributing. She is tachycardic and febrile here. Temp is 102.4. BP is normal. No hypoxia. Discussed with Dr. Zenia Resides. Will order basic labs and give IV abx. She states symptoms are exactly the same and therefore will not do extensive testing with CXR,  UA, blood cultures, etc.  Labs are pending at shift change. Care signed out to [place] PA-C at shift change. She was given IV Rocephin.  Final Clinical Impressions(s) / ED Diagnoses   Final diagnoses:  Cellulitis of left lower extremity    ED Discharge Orders    None       Recardo Evangelist, PA-C 07/09/18 2127    Recardo Evangelist, PA-C 07/09/18 2127    Lacretia Leigh, MD 07/10/18 2258

## 2018-07-09 NOTE — H&P (Addendum)
History and Physical    Cheyenne Guzman NIO:[id number] DOB: March 12, 1965 DOA: 07/09/2018  PCP: Laurey Morale, MD Patient coming from: Home  Chief Complaint: Left leg cellulitis  HPI: Cheyenne Guzman is a 53 y.o. female with medical history significant of insulin-dependent type 2 diabetes, PE on Xarelto, hyperlipidemia, hypothyroidism, rheumatoid arthritis, asthma, depression and anxiety, chronic pain syndrome, OSA, morbid obesity presenting to the hospital for evaluation of left leg cellulitis. Patient was recently admitted at the end of May for sepsis secondary to left lower extremity cellulitis and was treated with a 10-day course of antibiotics.  Patient states after finishing the course of antibiotics from her recent hospitalization she was feeling better but then today noticed that her left lower extremity is again swollen, painful, and red.  Her temperature was 98.9 F at home.  Reports compliance with home Xarelto.  She is also complaining of pain in her right groin region which she thinks might be due to a pulled muscle.  Denies any falls or obvious injury to the area.  States for the past 1 day she is having a lot of pain in her right groin region whenever she tries to walk or move her right leg.  ED Course: Temperature 102.4 F.  Tachycardic on arrival, now improved.  Not tachypneic or hypotensive.  White count 13.0.  Blood glucose 190.  Creatinine 1.2, recent baseline 1.0-1.1.  COVID-19 rapid test pending.  Received 1 L fluid bolus and ceftriaxone in the ED.  Review of Systems:  All systems reviewed and apart from history of presenting illness, are negative.  Past Medical History:  Diagnosis Date  . Anxiety    per pt. - mild   . Arthritis    back & ankles   . Asthma    seasonal   . Cancer [facility])    breast, sees Dr. Jana Hakim , Left - 11/2007  . Complication of anesthesia 2013   severe muscle spasms-sore-no doc  . Depression   . Diabetes mellitus    sees Dr. Chalmers Cater, diagnosed 2003   . HA (headache)   . Hearing loss    Went to [facility]  . Hyperlipidemia   . Kidney infection    - hosp. [facility]- septic- 10/2009  . Low back pain   . Peripheral neuropathy    R sided numbness- face & jaw  . Sleep apnea 9/15   severe-just started cpap-doing well  . TIA (transient ischemic attack)    in 2007,2008, and 2011  . Tinnitus of right ear   . Urosepsis 08-2009   with Klebsiella    Past Surgical History:  Procedure Laterality Date  . ABDOMINAL HYSTERECTOMY  08/2004  . BREAST BIOPSY  06/22/2011   Procedure: BREAST BIOPSY;  Surgeon: Stark Klein, MD;  Location: [place];  Service: General;  Laterality: Right;  . BREAST SURGERY  11/2007   left ductal carcinoma in situ  . CARPAL TUNNEL RELEASE Right 10/13/2013   Procedure: RIGHT CARPAL TUNNEL RELEASE;  Surgeon: Hessie Dibble, MD;  Location: [place];  Service: Orthopedics;  Laterality: Right;  . CARPAL TUNNEL RELEASE Left 11/24/2013   Procedure: LEFT CARPAL TUNNEL RELEASE WITH CYST EXCISION ;  Surgeon: Hessie Dibble, MD;  Location: [place];  Service: Orthopedics;  Laterality: Left;  . EAR CYST EXCISION Left 11/24/2013   Procedure: CYST REMOVAL;  Surgeon: Hessie Dibble, MD;  Location: [place];  Service: Orthopedics;  Laterality:  Left;  . FOOT SURGERY  2000   plantar fasciitis-left  . LUMBAR DISC SURGERY  03-29-14   Fusion, revision 04-27-14 L4-5 Cauda Equina with graft  . NERVE GRAFT  06-01-08   cadaver graft to right inferior alveolar nerve  in [place]     reports that she quit smoking about 21 years ago. She has never used smokeless tobacco. She reports current alcohol use. She reports that she does not use drugs.  Allergies  Allergen Reactions  . Vicodin [Hydrocodone-Acetaminophen] Nausea And Vomiting    Family History  Problem Relation Age of Onset  . Cancer Mother        breast  . Cancer Father        lung  . Diabetes  Father   . Cancer Sister        colon  . Cancer Maternal Aunt        breast - both  . Diabetes Sister   . Anesthesia problems Neg Hx     Prior to Admission medications   Medication Sig Start Date End Date Taking? Authorizing Provider  albuterol (PROVENTIL HFA;VENTOLIN HFA) 108 (90 Base) MCG/ACT inhaler Inhale 2 puffs into the lungs every 4 (four) hours as needed for wheezing or shortness of breath. 04/03/17  Yes Laurey Morale, MD  albuterol (PROVENTIL) (2.5 MG/3ML) 0.083% nebulizer solution 1 VIAL IN NEBULIZER EVERY 4 HOURS AS NEEDED FOR WHEEZING Patient taking differently: Take 2.5 mg by nebulization every 4 (four) hours as needed for wheezing.  10/13/13  Yes Laurey Morale, MD  ALPRAZolam Duanne Moron) 1 MG tablet TAKE 1 TABLET BY MOUTH THREE TIMES A DAY Patient taking differently: Take 1 mg by mouth 3 (three) times daily.  03/06/18  Yes Laurey Morale, MD  AZO-CRANBERRY PO Take 2 tablets by mouth daily.   Yes [provider]  Buprenorphine 15 MCG/HR PTWK Place 1 patch onto the skin every Saturday.  07/23/17  Yes [provider]  furosemide (LASIX) 40 MG tablet TAKE 1 TABLET (40 MG TOTAL) BY MOUTH DAILY. AS NEEDED FOR SWELLING. Patient taking differently: Take 40 mg by mouth daily.  02/28/16  Yes Laurey Morale, MD  gabapentin (NEURONTIN) 300 MG capsule Take 300 mg by mouth 2 (two) times daily.    Yes [provider]  Insulin Glargine (LANTUS SOLOSTAR) 100 UNIT/ML Solostar Pen Inject 280 Units into the skin every morning. and pen needles 3/day Patient taking differently: Inject 285 Units into the skin daily. and pen needles 3/day 10/16/17  Yes Renato Shin, MD  levothyroxine (SYNTHROID, LEVOTHROID) 75 MCG tablet Take 1 tablet (75 mcg total) by mouth daily. 05/28/17  Yes Laurey Morale, MD  methocarbamol (ROBAXIN) 750 MG tablet Take 1 tablet (750 mg total) by mouth 2 (two) times daily. Patient taking differently: Take 750 mg by mouth 3 (three) times daily.  02/03/15  Yes Laurey Morale, MD  rivaroxaban (XARELTO) 20 MG TABS tablet Take 20 mg by mouth daily. 05/22/18  Yes [provider]  sertraline (ZOLOFT) 100 MG tablet TAKE 2 TABLETS (200 MG TOTAL) BY MOUTH AT BEDTIME. Patient taking differently: Take 200 mg by mouth daily.  05/15/17  Yes Laurey Morale, MD  Blood Glucose Monitoring Suppl (CONTOUR NEXT MONITOR) w/Device KIT 1 each by Does not apply route 2 (two) times daily. To check blood sugars twice a day. 08/27/16   Renato Shin, MD  glucose blood (CONTOUR NEXT TEST) test strip Use to test blood sugar 2 times daily  12/18/16   Renato Shin, MD  glucose blood test strip Use as instructed 08/24/16   Renato Shin, MD    Physical Exam: Vitals:   07/09/18 1919 07/09/18 2016 07/09/18 2020 07/09/18 2030  BP:  132/67  (!) 123/91  Pulse:   100 (!) 101  Resp:      Temp:      TempSrc:      SpO2:   95% 95%  Weight: (!) 181.4 kg     Height:        Physical Exam  Constitutional: She is oriented to person, place, and time. She appears well-developed and well-nourished. No distress.  Morbidly obese  HENT:  Head: Normocephalic.  Mouth/Throat: Oropharynx is clear and moist.  Eyes: Right eye exhibits no discharge. Left eye exhibits no discharge.  Neck: Neck supple.  Cardiovascular: Normal rate, regular rhythm and intact distal pulses.  Pulmonary/Chest: Effort normal and breath sounds normal. No respiratory distress. She has no wheezes. She has no rales.  Abdominal: Soft. Bowel sounds are normal. She exhibits no distension. There is no abdominal tenderness. There is no guarding.  Musculoskeletal:        General: Edema present.     Comments: Bilateral lower extremity edema and chronic venous stasis changes. Left lower extremity appears erythematous, warm to touch, tender to palpation, and larger in size compared to the right lower extremity. Right upper inner thigh region muscles tender to palpation.  No obvious injury, edema, or bruising noted.  Flexion,  extension, and abduction of right hip limited secondary to pain.  Neurological: She is alert and oriented to person, place, and time.  Skin: Skin is warm and dry. She is not diaphoretic.     Labs on Admission: I have personally reviewed following labs and imaging studies  CBC: Recent Labs  Lab 07/09/18 2030  WBC 13.0*  NEUTROABS 11.5*  HGB 14.2  HCT 47.3*  MCV 80.2  PLT PLATELET CLUMPS NOTED ON SMEAR, UNABLE TO ESTIMATE   Basic Metabolic Panel: Recent Labs  Lab 07/09/18 2030  NA 136  K 5.1  CL 102  CO2 21*  GLUCOSE 190*  BUN 14  CREATININE 1.21*  CALCIUM 9.2   GFR: Estimated Creatinine Clearance: 92.9 mL/min (A) (by C-G formula based on SCr of 1.21 mg/dL (H)). Liver Function Tests: No results for input(s): AST, ALT, ALKPHOS, BILITOT, PROT, ALBUMIN in the last 168 hours. No results for input(s): LIPASE, AMYLASE in the last 168 hours. No results for input(s): AMMONIA in the last 168 hours. Coagulation Profile: No results for input(s): INR, PROTIME in the last 168 hours. Cardiac Enzymes: No results for input(s): CKTOTAL, CKMB, CKMBINDEX, TROPONINI in the last 168 hours. BNP (last 3 results) No results for input(s): PROBNP in the last 8760 hours. HbA1C: No results for input(s): HGBA1C in the last 72 hours. CBG: No results for input(s): GLUCAP in the last 168 hours. Lipid Profile: No results for input(s): CHOL, HDL, LDLCALC, TRIG, CHOLHDL, LDLDIRECT in the last 72 hours. Thyroid Function Tests: No results for input(s): TSH, T4TOTAL, FREET4, T3FREE, THYROIDAB in the last 72 hours. Anemia Panel: No results for input(s): VITAMINB12, FOLATE, FERRITIN, TIBC, IRON, RETICCTPCT in the last 72 hours. Urine analysis:    Component Value Date/Time   COLORURINE YELLOW 06/19/2018 1915   APPEARANCEUR HAZY (A) 06/19/2018 1915   APPEARANCEUR Clear 07/31/2013 1404   LABSPEC 1.020 06/19/2018 1915   PHURINE 7.0 06/19/2018 [place] 06/19/2018 [place] NEGATIVE  06/19/2018 1915  HGBUR negative 09/28/2009 1044   BILIRUBINUR NEGATIVE 06/19/2018 1915   BILIRUBINUR 1+ 10/31/2017 1721   BILIRUBINUR Negative 07/31/2013 [place] 06/19/2018 1915   PROTEINUR NEGATIVE 06/19/2018 1915   UROBILINOGEN 0.2 10/31/2017 1721   UROBILINOGEN 0.2 07/03/2012 1027   NITRITE NEGATIVE 06/19/2018 1915   LEUKOCYTESUR NEGATIVE 06/19/2018 1915    Radiological Exams on Admission: No results found.  Assessment/Plan Principal Problem:   Cellulitis Active Problems:   Pulmonary embolism (HCC)   Diabetes mellitus type 2 in obese (HCC)   Sepsis (HCC)   Thigh pain   Sepsis secondary to left lower extremity cellulitis Temperature 102.4 F.  Tachycardic on arrival, now improved. White count 13.0.  Left lower extremity erythematous, warm to touch, tender to palpation, and larger in size compared to the right lower extremity. -IV fluid hydration -Continue ceftriaxone -Tylenol PRN -Although patient is currently on Xarelto, there remains concern for possible DVT in this leg. ? Efficacy of DOAC given BMI 62.6.  Will order left lower extremity Doppler. -Blood culture x2 -Check lactic acid level  Right upper inner thigh pain Appears musculoskeletal based on history and exam.  However, patient reports severe pain with ambulation and hip motion was limited on exam due to pain. -X-ray of right hip to rule out fracture -Toradol PRN -Continue home Robaxin -PT evaluation  Uncontrolled insulin-dependent type 2 diabetes A1c 9.0 on Jun 19, 2018. -Continue home Lantus 285 units daily -Sliding scale insulin and CBG checks  History of PE -Continue home Xarelto  Hypothyroidism TSH checked on March 12, 2018 within normal range. -Continue home Synthroid  Asthma -Stable.  No bronchospasm.  Albuterol inhaler as needed.  Depression and anxiety -Continue home Zoloft  OSA -CPAP at night  DVT prophylaxis: Xarelto Code Status: Full code Family  Communication: No family available. Disposition Plan: Anticipate discharge after clinical improvement. Consults called: None Admission status: It is my clinical opinion that referral for OBSERVATION is reasonable and necessary in this patient based on the above information provided. The aforementioned taken together are felt to place the patient at high risk for further clinical deterioration. However it is anticipated that the patient may be medically stable for discharge from the hospital within 24 to 48 hours.  The medical decision making on this patient was of high complexity and the patient is at high risk for clinical deterioration, therefore this is a level 3 visit.  Shela Leff MD [facility] Pager [phone]  If 7PM-7AM, please contact night-coverage [email or web address] Password [facility]  07/09/2018, 10:23 PM

## 2018-07-10 ENCOUNTER — Inpatient Hospital Stay (HOSPITAL_COMMUNITY): Payer: Medicare Other

## 2018-07-10 ENCOUNTER — Encounter (HOSPITAL_COMMUNITY): Payer: Medicare Other

## 2018-07-10 ENCOUNTER — Encounter (HOSPITAL_COMMUNITY): Payer: Self-pay | Admitting: *Deleted

## 2018-07-10 DIAGNOSIS — E669 Obesity, unspecified: Secondary | ICD-10-CM | POA: Diagnosis not present

## 2018-07-10 DIAGNOSIS — E1169 Type 2 diabetes mellitus with other specified complication: Secondary | ICD-10-CM | POA: Diagnosis not present

## 2018-07-10 DIAGNOSIS — A419 Sepsis, unspecified organism: Secondary | ICD-10-CM | POA: Diagnosis present

## 2018-07-10 DIAGNOSIS — Z20828 Contact with and (suspected) exposure to other viral communicable diseases: Secondary | ICD-10-CM | POA: Diagnosis present

## 2018-07-10 DIAGNOSIS — I82402 Acute embolism and thrombosis of unspecified deep veins of left lower extremity: Secondary | ICD-10-CM | POA: Diagnosis not present

## 2018-07-10 DIAGNOSIS — J45909 Unspecified asthma, uncomplicated: Secondary | ICD-10-CM | POA: Diagnosis present

## 2018-07-10 DIAGNOSIS — R1030 Lower abdominal pain, unspecified: Secondary | ICD-10-CM | POA: Diagnosis present

## 2018-07-10 DIAGNOSIS — R1031 Right lower quadrant pain: Secondary | ICD-10-CM

## 2018-07-10 DIAGNOSIS — Z853 Personal history of malignant neoplasm of breast: Secondary | ICD-10-CM | POA: Diagnosis not present

## 2018-07-10 DIAGNOSIS — E1142 Type 2 diabetes mellitus with diabetic polyneuropathy: Secondary | ICD-10-CM | POA: Diagnosis present

## 2018-07-10 DIAGNOSIS — Z86711 Personal history of pulmonary embolism: Secondary | ICD-10-CM | POA: Diagnosis not present

## 2018-07-10 DIAGNOSIS — Z6841 Body Mass Index (BMI) 40.0 and over, adult: Secondary | ICD-10-CM | POA: Diagnosis not present

## 2018-07-10 DIAGNOSIS — M069 Rheumatoid arthritis, unspecified: Secondary | ICD-10-CM | POA: Diagnosis present

## 2018-07-10 DIAGNOSIS — I890 Lymphedema, not elsewhere classified: Secondary | ICD-10-CM | POA: Diagnosis present

## 2018-07-10 DIAGNOSIS — F419 Anxiety disorder, unspecified: Secondary | ICD-10-CM | POA: Diagnosis present

## 2018-07-10 DIAGNOSIS — M1990 Unspecified osteoarthritis, unspecified site: Secondary | ICD-10-CM | POA: Diagnosis present

## 2018-07-10 DIAGNOSIS — E039 Hypothyroidism, unspecified: Secondary | ICD-10-CM | POA: Diagnosis present

## 2018-07-10 DIAGNOSIS — F329 Major depressive disorder, single episode, unspecified: Secondary | ICD-10-CM | POA: Diagnosis present

## 2018-07-10 DIAGNOSIS — R600 Localized edema: Secondary | ICD-10-CM | POA: Diagnosis present

## 2018-07-10 DIAGNOSIS — L03119 Cellulitis of unspecified part of limb: Secondary | ICD-10-CM | POA: Diagnosis not present

## 2018-07-10 DIAGNOSIS — L03116 Cellulitis of left lower limb: Secondary | ICD-10-CM

## 2018-07-10 DIAGNOSIS — Z87440 Personal history of urinary (tract) infections: Secondary | ICD-10-CM | POA: Diagnosis not present

## 2018-07-10 DIAGNOSIS — G894 Chronic pain syndrome: Secondary | ICD-10-CM | POA: Diagnosis present

## 2018-07-10 DIAGNOSIS — Z86718 Personal history of other venous thrombosis and embolism: Secondary | ICD-10-CM | POA: Diagnosis not present

## 2018-07-10 DIAGNOSIS — H9190 Unspecified hearing loss, unspecified ear: Secondary | ICD-10-CM | POA: Diagnosis present

## 2018-07-10 DIAGNOSIS — N179 Acute kidney failure, unspecified: Secondary | ICD-10-CM | POA: Diagnosis present

## 2018-07-10 DIAGNOSIS — E785 Hyperlipidemia, unspecified: Secondary | ICD-10-CM | POA: Diagnosis present

## 2018-07-10 DIAGNOSIS — E662 Morbid (severe) obesity with alveolar hypoventilation: Secondary | ICD-10-CM | POA: Diagnosis present

## 2018-07-10 DIAGNOSIS — B373 Candidiasis of vulva and vagina: Secondary | ICD-10-CM | POA: Diagnosis present

## 2018-07-10 LAB — CBC
HCT: 42.7 % (ref 36.0–46.0)
Hemoglobin: 12.9 g/dL (ref 12.0–15.0)
MCH: 24 pg — ABNORMAL LOW (ref 26.0–34.0)
MCHC: 30.2 g/dL (ref 30.0–36.0)
MCV: 79.4 fL — ABNORMAL LOW (ref 80.0–100.0)
Platelets: 213 10*3/uL (ref 150–400)
RBC: 5.38 MIL/uL — ABNORMAL HIGH (ref 3.87–5.11)
RDW: 16.9 % — ABNORMAL HIGH (ref 11.5–15.5)
WBC: 14 10*3/uL — ABNORMAL HIGH (ref 4.0–10.5)
nRBC: 0 % (ref 0.0–0.2)

## 2018-07-10 LAB — BASIC METABOLIC PANEL
Anion gap: 10 (ref 5–15)
BUN: 20 mg/dL (ref 6–20)
CO2: 24 mmol/L (ref 22–32)
Calcium: 8.5 mg/dL — ABNORMAL LOW (ref 8.9–10.3)
Chloride: 101 mmol/L (ref 98–111)
Creatinine, Ser: 1.64 mg/dL — ABNORMAL HIGH (ref 0.44–1.00)
GFR calc Af Amer: 41 mL/min — ABNORMAL LOW (ref >60)
GFR calc non Af Amer: 35 mL/min — ABNORMAL LOW (ref >60)
Glucose, Bld: 326 mg/dL — ABNORMAL HIGH (ref 70–99)
Potassium: 4.4 mmol/L (ref 3.5–5.1)
Sodium: 135 mmol/L (ref 135–145)

## 2018-07-10 LAB — LACTIC ACID, PLASMA: Lactic Acid, Venous: 1.9 mmol/L (ref 0.5–1.9)

## 2018-07-10 LAB — GLUCOSE, CAPILLARY
Glucose-Capillary: 248 mg/dL — ABNORMAL HIGH (ref 70–99)
Glucose-Capillary: 162 mg/dL — ABNORMAL HIGH (ref 70–99)
Glucose-Capillary: 183 mg/dL — ABNORMAL HIGH (ref 70–99)
Glucose-Capillary: 142 mg/dL — ABNORMAL HIGH (ref 70–99)

## 2018-07-10 MED ORDER — INSULIN GLARGINE 100 UNIT/ML ~~LOC~~ SOLN
85.0000 [IU] | Freq: Every day | SUBCUTANEOUS | Status: DC
  Administered 2018-07-10 – 2018-07-15 (×5): 85 [IU] via SUBCUTANEOUS
  Filled 2018-07-10 (×6): qty 0.85

## 2018-07-10 MED ORDER — VANCOMYCIN HCL 10 G IV SOLR
2500.0000 mg | Freq: Once | INTRAVENOUS | Status: AC
  Administered 2018-07-10: 2500 mg via INTRAVENOUS
  Filled 2018-07-10: qty 2500

## 2018-07-10 MED ORDER — INSULIN GLARGINE 100 UNIT/ML ~~LOC~~ SOLN
100.0000 [IU] | Freq: Every day | SUBCUTANEOUS | Status: DC
  Administered 2018-07-10 – 2018-07-15 (×5): 100 [IU] via SUBCUTANEOUS
  Filled 2018-07-10 (×6): qty 1

## 2018-07-10 MED ORDER — INSULIN GLARGINE 100 UNIT/ML ~~LOC~~ SOLN
100.0000 [IU] | Freq: Every day | SUBCUTANEOUS | Status: DC
  Administered 2018-07-10 – 2018-07-15 (×5): 100 [IU] via SUBCUTANEOUS
  Filled 2018-07-10 (×7): qty 1

## 2018-07-10 MED ORDER — PIPERACILLIN-TAZOBACTAM 3.375 G IVPB
3.3750 g | Freq: Three times a day (TID) | INTRAVENOUS | Status: DC
  Administered 2018-07-10 – 2018-07-11 (×4): 3.375 g via INTRAVENOUS
  Filled 2018-07-10 (×4): qty 50

## 2018-07-10 MED ORDER — VANCOMYCIN HCL 10 G IV SOLR
1500.0000 mg | INTRAVENOUS | Status: DC
  Administered 2018-07-11: 1500 mg via INTRAVENOUS
  Filled 2018-07-10: qty 1500

## 2018-07-10 NOTE — Progress Notes (Signed)
Inpatient Diabetes Program Recommendations  AACE/ADA: New Consensus Statement on Inpatient Glycemic Control (2015)  Target Ranges:  Prepandial:   less than 140 mg/dL      Peak postprandial:   less than 180 mg/dL (1-2 hours)      Critically ill patients:  140 - 180 mg/dL   Lab Results  Component Value Date   GLUCAP 248 (H) 07/10/2018   HGBA1C 9.0 (H) 06/19/2018    Review of Glycemic Control Results for Cheyenne Guzman, Cheyenne Guzman (MRN [id number]) as of 07/10/2018 10:02  Ref. Range 06/22/2018 12:17 07/09/2018 23:19 07/10/2018 07:34  Glucose-Capillary Latest Ref Range: 70 - 99 mg/dL 234 (H) 158 (H) 248 (H)   Diabetes history: Type 2 DM Outpatient Diabetes medications: Lantus 280 units QHS Current orders for Inpatient glycemic control: Lantus 285 units QHS, Novolog 0-15 units TID, Novolog 0-5 units QHS  Inpatient Diabetes Program Recommendations:    Noted consult and spoke with patient, see note.   At this time, would consider changing current regimen as I do not anticipate physiological control of glucose with one large dose of Lantus. The recommendations while conservative may need further adjustment.  Instead consider the following:  -Lantus 45 units BID (1 units per kg/ half TTD) -Novolog 5 units TID (assuming patient is consuming >50% of meal). - Could consider adding GLP like Ozempic even for outpatient use as this could help control glucose and improve BMI with weight loss   Spoke with patient regarding outpatient diabetes management and verified home dose of Lantus 285 units QHS with one injection site. Patient sees Dr Loanne Drilling outpatient for endocrinology. States, "I take this everyday and check my sugar as ordered, but my control isn't getting anywhere. I don't feel that this is working." Reviewed patient's current A1c of 9.0%. Explained what a A1c is and what it measures. Also reviewed goal A1c with patient, importance of good glucose control @ home, and blood sugar goals. Reviewed patho of  DM, need for insulin, role of pancreas, DKA, impact of glucose trends with infection and increase risk of infections in the future with poor control, vascular changes, and comorbidites. Also, discussed absorption of injection, scar tissue and insulin pen length as all of these can be impacted with one injection at such a high dose. Patient has supplies and reports checking with ranges of 200-300's mg/dL. Denies drinking sugary beverages. Denies hypoglycemia, unless she is active. Patient states, "I try to watch how active I am, because of fear going low. However, I want to be more active because I am trying to change my lifestyle and be more healthy." In the last few months, reports eating much better, limiting carbohydrates and not eating foods that are high in carbohydrates.  Patient requested information on local endos. Will provide list. Reviewed some questions to ask provider at the next appointment, encouraged to continue healthy lifestyle/eating habits/increasing activity, to continue checking CBGs and reviewed when to contact MD for insulin adjustment.  Thanks, [name], MSN, RNC-OB Diabetes Coordinator [phone] (8a-5p)

## 2018-07-10 NOTE — Progress Notes (Addendum)
[facility]                                                                              Patient Demographics  Cheyenne Guzman, is a 53 y.o. female, DOB - Jan 30, 1965, QJF:[id number]  Admit date - 07/09/2018   Admitting Physician Cheyenne Leff, MD  Outpatient Primary MD for the patient is Cheyenne Morale, MD  Outpatient specialists:   LOS - 0  days   Medical records reviewed and are as summarized below:    Chief Complaint  Patient presents with  . Recurrent Skin Infections       Brief summary   Patient is a 53 year old female with diabetes type 2, IDDM, PE on Xarelto, hypothyroidism, hyperlipidemia, RA, asthma, depression, anxiety, chronic pain syndrome, OSA, morbid obesity who was admitted in May for left lower extremity cellulitis, placed on IV Rocephin while inpatient, then discharged on Keflex for 10-day course.  Patient reported that she finished the antibiotics, felt better and started noticing redness, painful and swelling of the left lower extremity.  Patient is on Xarelto at home.  She also reports pain in the right groin. In ED temp 102.4, tachycardia, WBC 13.0, creatinine 1.2   Assessment & Plan    Principal Problem: Sepsis secondary to left lower extremity cellulitis -Patient was placed on IV Rocephin however patient feels that cellulitis is worse today and up to the knee.  L LE tender swollen and erythematous. -Will DC IV Rocephin, placed on IV vancomycin and Zosyn (history of diabetes.) -Follow Doppler ultrasound of the left lower extremity -If no improvement in next 24 hours, will discuss with infectious disease and obtain CT of the left lower extremity to rule out any abscess/myositis addendum Venous doppler negative for DVT  Active Problems: Right groin pain -Appears to be musculoskeletal, DC Toradol due to worsening creatinine    Pulmonary embolism ([place]) -Continue home Xarelto    Diabetes mellitus type 2 in obese (HCC) -Hemoglobin  A1c 9.0 on 06/19/2018 -Patient reports taking Lantus 285 units daily, endocrinologist, Dr. Loanne Guzman -Continue sliding scale insulin, home dose regimen, requested diabetic coordinator consult  History of asthma Currently stable, no wheezing  Depression, anxiety Continue Zoloft  OSA Continue CPAP  Morbid obesity BMI 62.6, recommended diet and weight control  Acute kidney injury -Likely due to sepsis, Toradol, DC NSAIDs Patient has received IV fluid hydration, recheck be met   Code Status: Full CODE STATUS DVT Prophylaxis: Xarelto Family Communication: Discussed in detail with the patient, all imaging results, lab results explained to the patient   Disposition Plan: Significant cellulitis left lower extremity up to knee, swollen, tender, will likely need few days of IV antibiotics given recurrent cellulitis.  Time Spent in minutes 35 minutes  Procedures:  None  Consultants:   None  Antimicrobials:   Anti-infectives (From admission, onward)   Start     Dose/Rate Route Frequency Ordered Stop   07/11/18 0900  vancomycin (VANCOCIN) 1,500 mg in sodium chloride 0.9 % 500 mL IVPB     1,500 mg 250 mL/hr over 120 Minutes Intravenous Every 24 hours 07/10/18 0829     07/10/18 2000  cefTRIAXone (ROCEPHIN)  1 g in sodium chloride 0.9 % 100 mL IVPB  Status:  Discontinued     1 g 200 mL/hr over 30 Minutes Intravenous Every 24 hours 07/09/18 2210 07/09/18 2211   07/10/18 2000  cefTRIAXone (ROCEPHIN) 2 g in sodium chloride 0.9 % 100 mL IVPB  Status:  Discontinued     2 g 200 mL/hr over 30 Minutes Intravenous Every 24 hours 07/09/18 2211 07/10/18 0746   07/10/18 0830  piperacillin-tazobactam (ZOSYN) IVPB 3.375 g     3.375 g 12.5 mL/hr over 240 Minutes Intravenous Every 8 hours 07/10/18 0823     07/10/18 0830  vancomycin (VANCOCIN) 2,500 mg in sodium chloride 0.9 % 500 mL IVPB     2,500 mg 250 mL/hr over 120 Minutes Intravenous  Once 07/10/18 0829 07/10/18 1122   07/09/18 2215   cefTRIAXone (ROCEPHIN) 1 g in sodium chloride 0.9 % 100 mL IVPB     1 g 200 mL/hr over 30 Minutes Intravenous  Once 07/09/18 2211 07/10/18 0110   07/09/18 2000  cefTRIAXone (ROCEPHIN) 1 g in sodium chloride 0.9 % 100 mL IVPB     1 g 200 mL/hr over 30 Minutes Intravenous  Once 07/09/18 1956 07/09/18 2125          Medications  Scheduled Meds: . gabapentin  300 mg Oral BID  . insulin aspart  0-15 Units Subcutaneous TID WC  . insulin aspart  0-5 Units Subcutaneous QHS  . insulin glargine  100 Units Subcutaneous QHS   And  . insulin glargine  100 Units Subcutaneous QHS   And  . insulin glargine  85 Units Subcutaneous QHS  . levothyroxine  75 mcg Oral Daily  . methocarbamol  750 mg Oral BID  . rivaroxaban  20 mg Oral Q supper  . sertraline  200 mg Oral Daily   Continuous Infusions: . piperacillin-tazobactam (ZOSYN)  IV 3.375 g (07/10/18 0848)  . [START ON 07/11/2018] vancomycin     PRN Meds:.acetaminophen, albuterol      Subjective:   Cheyenne Guzman was seen and examined today.  Frustrated with the LLE cellulitis recurrent.  Feels pain in the right groin.  Per patient redness seems to be worse today up to the knee in the left lower leg.  Patient denies dizziness, chest pain, shortness of breath, abdominal pain, N/V/D/C, new weakness, numbess, tingling.   Objective:   Vitals:   07/09/18 2030 07/09/18 2251 07/09/18 2323 07/10/18 0401  BP: (!) 123/91 (!) 112/56 118/71 126/66  Pulse: (!) 101 92 91 79  Resp:   18 16  Temp:   99.8 F (37.7 C) 98.6 F (37 C)  TempSrc:   Oral Oral  SpO2: 95% 94% 96% 95%  Weight:      Height:        Intake/Output Summary (Last 24 hours) at 07/10/2018 1128 Last data filed at 07/10/2018 0930 Gross per 24 hour  Intake 1534.63 ml  Output -  Net 1534.63 ml     Wt Readings from Last 3 Encounters:  07/09/18 (!) 181.4 kg  06/20/18 (!) 199.7 kg  03/13/18 (!) 191.1 kg     Exam  General: Alert and oriented x 3, NAD  Eyes:   HEENT:    Cardiovascular: S1 S2 auscultated, Regular rate and rhythm.  Respiratory: Clear to auscultation bilaterally, no wheezing, rales or rhonchi  Gastrointestinal: Soft, nontender, nondistended, + bowel sounds  Ext: no pedal edema bilaterally  Neuro: No new deficits  Musculoskeletal: No digital cyanosis, clubbing  Skin: See  below, cellulitis left lower extremity  Psych: Normal affect and demeanor, alert and oriented x3        Data Reviewed:  I have personally reviewed following labs and imaging studies  Micro Results No results found for this or any previous visit (from the past 240 hour(s)).  Radiology Reports Dg Chest Portable 1 View  Result Date: 06/19/2018 CLINICAL DATA:  Fever and chills.  Leg swelling. EXAM: PORTABLE CHEST 1 VIEW COMPARISON:  CT chest dated March 12, 2018. Chest x-ray dated March 11, 2018. FINDINGS: Stable mild cardiomegaly. Normal mediastinal contours. Normal pulmonary vascularity. No focal consolidation, pleural effusion, or pneumothorax. No acute osseous abnormality. IMPRESSION: No active disease. Electronically Signed   By: Titus Dubin M.D.   On: 06/19/2018 15:42   Dg Hip Unilat With Pelvis 2-3 Views Right  Result Date: 07/09/2018 CLINICAL DATA:  Right groin pain for 2 days. Cellulitis in the left lower leg and sepsis. EXAM: DG HIP (WITH OR WITHOUT PELVIS) 2-3V RIGHT COMPARISON:  None. FINDINGS: There is no evidence of hip fracture or dislocation. Mild osteoarthrosis of the hip joints with acetabular osteophytosis. IMPRESSION: No acute fracture or dislocation identified. Electronically Signed   By: Kristine Garbe M.D.   On: 07/09/2018 22:53   Vas [place] Lower Extremity Venous (dvt) (mc And Wl 7a-7p)  Result Date: 06/20/2018  Lower Venous Study Indications: Swelling, and Edema.  Limitations: Body habitus, poor ultrasound/tissue interface and patient positioning. Performing Technologist: Abram Sander RVS  Examination Guidelines: A complete  evaluation includes B-mode imaging, spectral Doppler, color Doppler, and power Doppler as needed of all accessible portions of each vessel. Bilateral testing is considered an integral part of a complete examination. Limited examinations for reoccurring indications may be performed as noted.  +---------+---------------+---------+-----------+----------+--------------+ LEFT     CompressibilityPhasicitySpontaneityPropertiesSummary        +---------+---------------+---------+-----------+----------+--------------+ CFV      Full                                                        +---------+---------------+---------+-----------+----------+--------------+ SFJ      Full                                                        +---------+---------------+---------+-----------+----------+--------------+ FV Prox  Full                                                        +---------+---------------+---------+-----------+----------+--------------+ FV Mid                                                Not visualized +---------+---------------+---------+-----------+----------+--------------+ FV Distal                                             Not  visualized +---------+---------------+---------+-----------+----------+--------------+ PFV      Full                                                        +---------+---------------+---------+-----------+----------+--------------+ POP                                                   Not visualized +---------+---------------+---------+-----------+----------+--------------+ PTV                                                   Not visualized +---------+---------------+---------+-----------+----------+--------------+ PERO                                                  Not visualized +---------+---------------+---------+-----------+----------+--------------+     Summary: Left: There is no evidence of deep vein  thrombosis in the lower extremity. However, portions of this examination were limited- see technologist comments above. No cystic structure found in the popliteal fossa.  *See table(s) above for measurements and observations. Electronically signed by Monica Martinez MD on 06/20/2018 at 5:24:03 PM.    Final     Lab Data:  CBC: Recent Labs  Lab 07/09/18 2030 07/10/18 0346  WBC 13.0* 14.0*  NEUTROABS 11.5*  --   HGB 14.2 12.9  HCT 47.3* 42.7  MCV 80.2 79.4*  PLT PLATELET CLUMPS NOTED ON SMEAR, UNABLE TO ESTIMATE 704   Basic Metabolic Panel: Recent Labs  Lab 07/09/18 2030 07/10/18 0346  NA 136 135  K 5.1 4.4  CL 102 101  CO2 21* 24  GLUCOSE 190* 326*  BUN 14 20  CREATININE 1.21* 1.64*  CALCIUM 9.2 8.5*   GFR: Estimated Creatinine Clearance: 68.6 mL/min (A) (by C-G formula based on SCr of 1.64 mg/dL (H)). Liver Function Tests: No results for input(s): AST, ALT, ALKPHOS, BILITOT, PROT, ALBUMIN in the last 168 hours. No results for input(s): LIPASE, AMYLASE in the last 168 hours. No results for input(s): AMMONIA in the last 168 hours. Coagulation Profile: No results for input(s): INR, PROTIME in the last 168 hours. Cardiac Enzymes: No results for input(s): CKTOTAL, CKMB, CKMBINDEX, TROPONINI in the last 168 hours. BNP (last 3 results) No results for input(s): PROBNP in the last 8760 hours. HbA1C: No results for input(s): HGBA1C in the last 72 hours. CBG: Recent Labs  Lab 07/09/18 2319 07/10/18 0734  GLUCAP 158* 248*   Lipid Profile: No results for input(s): CHOL, HDL, LDLCALC, TRIG, CHOLHDL, LDLDIRECT in the last 72 hours. Thyroid Function Tests: No results for input(s): TSH, T4TOTAL, FREET4, T3FREE, THYROIDAB in the last 72 hours. Anemia Panel: No results for input(s): VITAMINB12, FOLATE, FERRITIN, TIBC, IRON, RETICCTPCT in the last 72 hours. Urine analysis:    Component Value Date/Time   COLORURINE YELLOW 06/19/2018 1915   APPEARANCEUR HAZY (A) 06/19/2018  1915   APPEARANCEUR Clear 07/31/2013 1404   LABSPEC 1.020 06/19/2018 1915   PHURINE 7.0 06/19/2018 1915  GLUCOSEU NEGATIVE 06/19/2018 1915   HGBUR NEGATIVE 06/19/2018 1915   HGBUR negative 09/28/2009 [place] 06/19/2018 1915   BILIRUBINUR 1+ 10/31/2017 1721   BILIRUBINUR Negative 07/31/2013 [place] 06/19/2018 1915   PROTEINUR NEGATIVE 06/19/2018 1915   UROBILINOGEN 0.2 10/31/2017 1721   UROBILINOGEN 0.2 07/03/2012 1027   NITRITE NEGATIVE 06/19/2018 1915   LEUKOCYTESUR NEGATIVE 06/19/2018 1915     Sonika Levins M.D. [facility] 07/10/2018, 11:28 AM  Pager: [phone] Between 7am to 7pm - call Pager - [phone]  After 7pm go to [email or web address] - password [email or web address]  Call night coverage person covering after 7pm

## 2018-07-10 NOTE — Progress Notes (Signed)
Pt arrived to the floor via wheelchair. Pt ambulates with a slow steady gait. Is assist x1. Pt transfers from chair to bed with ease. Noted LLE from ankle to knee, discolored, hot red, edema +2, tender to touch. RLE about +1, discolored nontender, warm. Pt noted with MASD in abdominal folds groin area, buttocks slightly red (excoriation).   Lab with a critical Lactic acid at 2.2. Amion text to covering Dr. Baltazar Najjar. Noted NS 500 cc bolus given and a additional rocephin 1g IV given. Follow up lab draw this morning. Pt with personal pain patch noted on R shoulder area.   IVF's infusing without difficulty, pain denies pain at this time. On xarelto for VTE. awaiting VAS to r/o DVT/PE. Phone and call light placed in reach.

## 2018-07-10 NOTE — Progress Notes (Signed)
Pharmacy Antibiotic Note  Cheyenne Guzman is a 53 y.o. female admitted on 07/09/2018 with LLE cellulitis, recurrent.  Pharmacy has been consulted for vancomycin and zosyn dosing. Patient morbidly obese with elevated Scr.   Plan: Zosyn 3.375gm IV q8h Vancomycin 2500mg  IV x 1 then 1500mg  q24h Expected AUC 455, Scr used 1.64 Monitor length of therapy, clinical course, renal function, and vancomycin levels as needed.   Height: 5\' 7"  (170.2 cm) Weight: (!) 400 lb (181.4 kg) IBW/kg (Calculated) : 61.6  Temp (24hrs), Avg:100.3 F (37.9 C), Min:98.6 F (37 C), Max:102.4 F (39.1 C)  Recent Labs  Lab 07/09/18 2030 07/09/18 2317 07/10/18 0346  WBC 13.0*  --  14.0*  CREATININE 1.21*  --  1.64*  LATICACIDVEN  --  2.2* 1.9    Estimated Creatinine Clearance: 68.6 mL/min (A) (by C-G formula based on SCr of 1.64 mg/dL (H)).    Allergies  Allergen Reactions  . Vicodin [Hydrocodone-Acetaminophen] Nausea And Vomiting    Antimicrobials this admission: 6/18 vancomycin >>  6/18 zosyn >>    Viridiana Spaid A. Levada Dy, PharmD, [facility] Please utilize Amion for appropriate phone number to reach the unit pharmacist ([place])    07/10/2018 8:29 AM

## 2018-07-10 NOTE — Progress Notes (Signed)
Left lower extremity venous duplex completed. Preliminary results in Chart review CV Proc. [place] Heiress Williamson,RVS 07/10/2018, 5:29 PM

## 2018-07-10 NOTE — Evaluation (Signed)
Physical Therapy Evaluation & Discharge Patient Details Name: Cheyenne Guzman MRN: [id number] DOB: [birth year] Today's Date: 07/10/2018   History of Present Illness  Pt is a 53 y.o. female admitted 07/09/18  with LLE cellulitis. Of note, recent admission 05/2018 for sepsis secondary to LLE cellulitis treated with 10-day abx. Pt also with R groin pain; xray negative for acute abnormality. PMH includes DM2, PE, HLD, RA, asthma, chronic pain, morbid obesity, depression, anxiety.    Clinical Impression  Patient evaluated by Physical Therapy with no further acute PT needs identified. PTA, pt mod indep with rollator, lives with supportive family. Today, pt indep with mobility. Limited by L lower leg and R groin pain; discussed therex for improved hip mobility/ROM/stretching. All education has been completed and the patient has no further questions. Acute PT is signing off. Thank you for this referral.    Follow Up Recommendations No PT follow up;Supervision - Intermittent    Equipment Recommendations  None recommended by PT    Recommendations for Other Services       Precautions / Restrictions Precautions Precautions: None Restrictions Weight Bearing Restrictions: No      Mobility  Bed Mobility Overal bed mobility: Modified Independent             General bed mobility comments: HOB elevated  Transfers Overall transfer level: Independent                  Ambulation/Gait Ambulation/Gait assistance: Independent Gait Distance (Feet): 24 Feet Assistive device: None;IV Pole Gait Pattern/deviations: Step-through pattern;Decreased stride length;Wide base of support Gait velocity: Decreased Gait velocity interpretation: <1.8 ft/sec, indicate of risk for recurrent falls General Gait Details: Slow, antalgic gait indep with and without pushing IV pole  Stairs            Wheelchair Mobility    Modified Rankin (Stroke Patients Only)       Balance Overall balance  assessment: Modified Independent                                           Pertinent Vitals/Pain Pain Assessment: Faces Faces Pain Scale: Hurts little more Pain Location: R groin > LLE Pain Descriptors / Indicators: Grimacing;Guarding;Sore Pain Intervention(s): Monitored during session    Home Living Family/patient expects to be discharged to:: Private residence Living Arrangements: Spouse/significant other;Children Available Help at Discharge: Family;Available PRN/intermittently Type of Home: House Home Access: Level entry     Home Layout: One level Home Equipment: Walker - 2 wheels;Walker - 4 wheels;Cane - single point;Tub bench      Prior Function Level of Independence: Independent         Comments: Indep with household ambulation, uses rollator when outside. Has above ground pool pt tries to walk in and do exercise in daily. No MOI for R groin pain     Hand Dominance        Extremity/Trunk Assessment   Upper Extremity Assessment Upper Extremity Assessment: Overall WFL for tasks assessed    Lower Extremity Assessment Lower Extremity Assessment: RLE deficits/detail;LLE deficits/detail RLE Deficits / Details: Functionally at least 3/5 throughout; c/o R groin pain with hip mobility LLE Deficits / Details: Functionally at least 3/5 throughout; significant lower leg/ankle/foot swelling/redness LLE: Unable to fully assess due to pain       Communication   Communication: No difficulties  Cognition Arousal/Alertness: Awake/alert Behavior During Therapy: [facility] for tasks  assessed/performed Overall Cognitive Status: Within Functional Limits for tasks assessed                                        General Comments General comments (skin integrity, edema, etc.): Discussed groin/hip mobility and stretching; LLE ROM/therex/elevation    Exercises     Assessment/Plan    PT Assessment Patent does not need any further PT services  PT  Problem List         PT Treatment Interventions      PT Goals (Current goals can be found in the Care Plan section)  Acute Rehab PT Goals PT Goal Formulation: All assessment and education complete, DC therapy    Frequency     Barriers to discharge        Co-evaluation               AM-PAC PT "6 Clicks" Mobility  Outcome Measure Help needed turning from your back to your side while in a flat bed without using bedrails?: None Help needed moving from lying on your back to sitting on the side of a flat bed without using bedrails?: None Help needed moving to and from a bed to a chair (including a wheelchair)?: None Help needed standing up from a chair using your arms (e.g., wheelchair or bedside chair)?: None Help needed to walk in hospital room?: None Help needed climbing 3-5 steps with a railing? : A Little 6 Click Score: 23    End of Session   Activity Tolerance: Patient tolerated treatment well Patient left: in bed;with call bell/phone within reach Nurse Communication: Mobility status PT Visit Diagnosis: Other abnormalities of gait and mobility (R26.89)    Time: 2423-5361 PT Time Calculation (min) (ACUTE ONLY): 21 min   Charges:   PT Evaluation $PT Eval Moderate Complexity: [place], PT, DPT Acute Rehabilitation Services  Pager [phone] Office [place] 07/10/2018, 9:28 AM

## 2018-07-10 NOTE — Plan of Care (Signed)
  Problem: Skin Integrity: Goal: Risk for impaired skin integrity will decrease Outcome: Progressing   Problem: Elimination: Goal: Will not experience complications related to bowel motility Outcome: Progressing

## 2018-07-11 ENCOUNTER — Inpatient Hospital Stay (HOSPITAL_COMMUNITY): Payer: Medicare Other

## 2018-07-11 ENCOUNTER — Encounter (HOSPITAL_COMMUNITY): Payer: Self-pay

## 2018-07-11 LAB — BASIC METABOLIC PANEL
Anion gap: 9 (ref 5–15)
BUN: 19 mg/dL (ref 6–20)
CO2: 25 mmol/L (ref 22–32)
Calcium: 8.7 mg/dL — ABNORMAL LOW (ref 8.9–10.3)
Chloride: 103 mmol/L (ref 98–111)
Creatinine, Ser: 1.17 mg/dL — ABNORMAL HIGH (ref 0.44–1.00)
GFR calc Af Amer: >60 mL/min (ref >60)
GFR calc non Af Amer: 53 mL/min — ABNORMAL LOW (ref >60)
Glucose, Bld: 145 mg/dL — ABNORMAL HIGH (ref 70–99)
Potassium: 4.5 mmol/L (ref 3.5–5.1)
Sodium: 137 mmol/L (ref 135–145)

## 2018-07-11 LAB — CBC
HCT: 39.2 % (ref 36.0–46.0)
Hemoglobin: 11.9 g/dL — ABNORMAL LOW (ref 12.0–15.0)
MCH: 24.2 pg — ABNORMAL LOW (ref 26.0–34.0)
MCHC: 30.4 g/dL (ref 30.0–36.0)
MCV: 79.7 fL — ABNORMAL LOW (ref 80.0–100.0)
Platelets: 200 10*3/uL (ref 150–400)
RBC: 4.92 MIL/uL (ref 3.87–5.11)
RDW: 17 % — ABNORMAL HIGH (ref 11.5–15.5)
WBC: 10.5 10*3/uL (ref 4.0–10.5)
nRBC: 0 % (ref 0.0–0.2)

## 2018-07-11 LAB — NOVEL CORONAVIRUS, NAA (HOSP ORDER, SEND-OUT TO REF LAB; TAT 18-24 HRS): SARS-CoV-2, NAA: NOT DETECTED

## 2018-07-11 LAB — GLUCOSE, CAPILLARY
Glucose-Capillary: 118 mg/dL — ABNORMAL HIGH (ref 70–99)
Glucose-Capillary: 166 mg/dL — ABNORMAL HIGH (ref 70–99)
Glucose-Capillary: 156 mg/dL — ABNORMAL HIGH (ref 70–99)
Glucose-Capillary: 243 mg/dL — ABNORMAL HIGH (ref 70–99)

## 2018-07-11 MED ORDER — HYDROMORPHONE HCL 1 MG/ML IJ SOLN: 1.0000 mg | Freq: Four times a day (QID) | INTRAMUSCULAR | Status: DC | PRN

## 2018-07-11 MED ORDER — VANCOMYCIN HCL IN DEXTROSE 1-5 GM/200ML-% IV SOLN: 1000.0000 mg | Freq: Two times a day (BID) | INTRAVENOUS | Status: DC

## 2018-07-11 MED ORDER — ALPRAZOLAM 0.5 MG PO TABS
1.0000 mg | ORAL_TABLET | Freq: Three times a day (TID) | ORAL | Status: DC
  Administered 2018-07-11 (×2): 1 mg via ORAL
  Administered 2018-07-11: 0.5 mg via ORAL
  Administered 2018-07-12 (×3): 1 mg via ORAL
  Filled 2018-07-11 (×6): qty 2

## 2018-07-11 MED ORDER — FUROSEMIDE 10 MG/ML IJ SOLN
40.0000 mg | Freq: Once | INTRAMUSCULAR | Status: AC
  Administered 2018-07-11: 40 mg via INTRAVENOUS
  Filled 2018-07-11: qty 4

## 2018-07-11 MED ORDER — CEFAZOLIN SODIUM-DEXTROSE 2-4 GM/100ML-% IV SOLN
2.0000 g | Freq: Three times a day (TID) | INTRAVENOUS | Status: DC
  Administered 2018-07-11 – 2018-07-14 (×10): 2 g via INTRAVENOUS
  Filled 2018-07-11 (×10): qty 100

## 2018-07-11 NOTE — Plan of Care (Signed)
Problem: Education: Goal: Knowledge of General Education information will improve Description: Including pain rating scale, medication(s)/side effects and non-pharmacologic comfort measures Outcome: Progressing   Problem: Health Behavior/Discharge Planning: Goal: Ability to manage health-related needs will improve Outcome: Progressing   Problem: Clinical Measurements: Goal: Ability to maintain clinical measurements within normal limits will improve Outcome: Progressing Goal: Diagnostic test results will improve Outcome: Progressing Goal: Respiratory complications will improve Outcome: Progressing   Problem: Nutrition: Goal: Adequate nutrition will be maintained Outcome: Progressing   Problem: Elimination: Goal: Will not experience complications related to urinary retention Outcome: Progressing   Problem: Skin Integrity: Goal: Risk for impaired skin integrity will decrease Outcome: Progressing

## 2018-07-11 NOTE — Plan of Care (Signed)
  Problem: Education: Goal: Knowledge of General Education information will improve Description: Including pain rating scale, medication(s)/side effects and non-pharmacologic comfort measures Outcome: Progressing   Problem: Elimination: Goal: Will not experience complications related to bowel motility Outcome: Progressing   Problem: Skin Integrity: Goal: Risk for impaired skin integrity will decrease Outcome: Progressing   

## 2018-07-11 NOTE — Progress Notes (Signed)
Pharmacy Antibiotic Note  Cheyenne Guzman is a 53 y.o. female admitted on 07/09/2018 with LLE cellulitis, recurrent.  Pharmacy has been consulted for Vancomycin and Zosyn dosing.   Plan: Continue Zosyn 3.375g IV q8h (each dose infused over 4 hours). Adjust Vancomycin to 1g IV q12h based on current renal function. Vancomycin levels at steady state, as indicated. Monitor renal function, cultures, clinical course.  F/u ID recs.    Height: 5\' 7"  (170.2 cm) Weight: (!) 400 lb (181.4 kg) IBW/kg (Calculated) : 61.6  Temp (24hrs), Avg:99.3 F (37.4 C), Min:98.6 F (37 C), Max:99.8 F (37.7 C)  Recent Labs  Lab 07/09/18 2030 07/09/18 2317 07/10/18 0346 07/11/18 0350  WBC 13.0*  --  14.0* 10.5  CREATININE 1.21*  --  1.64* 1.17*  LATICACIDVEN  --  2.2* 1.9  --     Estimated Creatinine Clearance: 96.1 mL/min (A) (by C-G formula based on SCr of 1.17 mg/dL (H)).    Allergies  Allergen Reactions  . Vicodin [Hydrocodone-Acetaminophen] Nausea And Vomiting    Lindell Spar, PharmD, BCPS Clinical Pharmacist [phone] 07/11/2018 1:01 PM

## 2018-07-11 NOTE — Plan of Care (Signed)
  Problem: Education: Goal: Knowledge of General Education information will improve Description: Including pain rating scale, medication(s)/side effects and non-pharmacologic comfort measures Outcome: Progressing   Problem: Clinical Measurements: Goal: Diagnostic test results will improve Outcome: Progressing   Problem: Skin Integrity: Goal: Risk for impaired skin integrity will decrease Outcome: Progressing

## 2018-07-11 NOTE — Consult Note (Signed)
[facility]    Date of Admission:  07/09/2018     Total days of antibiotics 3  Vancomycin day 2  Zosyn day 2                Reason for Consult: Cellulitis LLE, recurrent, non-purulent     Referring Provider: Dr. Tana Coast  Primary Care Provider: Laurey Morale, MD   Assessment: Cheyenne Guzman is a 53 y.o. female with recurrent left lower extremity cellulitis. On assessment she still is quite warm, red and tender. Temperature curve is improving on broad spectrum antibiotic therapy. She had good clinical response to ceftriaxone/PO cephalexin with a quick recurrence. CT scan w/o any abscess or bone involvement. I would not suspect any gram negative involvement and will narrow her antibiotics to cefazolin 2gm TID. May need to add Clindamycin for 48 hours to help clear infection. She is now on day 3 of treatment and I am hopeful that she will soon start to feel better. Blood cultures are negative.    She does have underlying lymphedema/chronic hyperpigmented changes and has many other risk factors for recurrent cellulitis. She may be a good candidate for abortive amoxicillin at home to take at onset of flare to intervene early.   Right groin pain I am not certain is related - she does not have infection on the RLE. Difficult to appreciate anything on exam with body habitus but ultrasound is currently pending final read.    Plan: 1. Ancef 2 gm IV TID please 2. Follow U/S results to understand R groin pain 3. Follow clinical condition to help with duration of therapy/transition to PO 4. Lymphedema clinic outpatient would be helpful for her  5. May require amox therapy at home for early abortive treatment    Principal Problem:   Cellulitis Active Problems:   Pulmonary embolism (HCC)   Diabetes mellitus type 2 in obese (HCC)   Sepsis (HCC)   Thigh pain   . ALPRAZolam  1 mg Oral TID  . gabapentin  300 mg Oral BID  . insulin aspart  0-15 Units  Subcutaneous TID WC  . insulin aspart  0-5 Units Subcutaneous QHS  . insulin glargine  100 Units Subcutaneous QHS   And  . insulin glargine  100 Units Subcutaneous QHS   And  . insulin glargine  85 Units Subcutaneous QHS  . levothyroxine  75 mcg Oral Daily  . methocarbamol  750 mg Oral BID  . rivaroxaban  20 mg Oral Q supper  . sertraline  200 mg Oral Daily    HPI: Cheyenne Guzman is a 53 y.o. female with diabetes type 2, IDDM, PE on Xarelto, hypothyroidism, hyperlipidemia, RA (not currently on treatment), asthma, depression, anxiety, chronic pain syndrome, OSA, morbid obesity.   She presented to the hospital on 6/17 for care after recent discharge for LLE cellulitis in May that responded to ceftriaxone and oral cephalexin x 10 days. She says that the swelling/pain and fever started about 2-3 days after stopping antibiotics. She also notes pain to the right groin (she attributes this to a pulled muscle). Upon presentation to the ER she had high fever to 102.4 F, tachycardic (improved after IVF), leukocytosis to 13K. Normotensive and hemodynamically stable. She underwent CT scan of the tibia/fibula that was negative for deep bone involvement and only revealed extensive soft tissue swelling.   She says that just before the first episode of cellulitis she incurred a  small injury to her LLE due to scratch and skin break from small puppy. She had at one point started leaking a lot of clear yellow fluid out of this site but has stopped a few weeks ago now. She still feels "lousy."    Her diabetes is not under good control with A1C 9% in late May 2020. She takes Xarelto regularly at home as directed.    Review of Systems: Review of Systems  All other systems reviewed and are negative.   Past Medical History:  Diagnosis Date  . Anxiety    per pt. - mild   . Arthritis    back & ankles   . Asthma    seasonal   . Cancer [facility])    breast, sees Dr. Jana Hakim , Left - 11/2007  . Complication of  anesthesia 2013   severe muscle spasms-sore-no doc  . Depression   . Diabetes mellitus    sees Dr. Chalmers Cater, diagnosed 2003  . HA (headache)   . Hearing loss    Went to [facility]  . Hyperlipidemia   . Kidney infection    - hosp. [facility]- septic- 10/2009  . Low back pain   . Peripheral neuropathy    R sided numbness- face & jaw  . Sleep apnea 9/15   severe-just started cpap-doing well  . TIA (transient ischemic attack)    in 2007,2008, and 2011  . Tinnitus of right ear   . Urosepsis 08-2009   with Klebsiella    Social History   Tobacco Use  . Smoking status: Former Smoker    Quit date: 02/18/1997    Years since quitting: 21.4  . Smokeless tobacco: Never Used  Substance Use Topics  . Alcohol use: Yes    Comment: rare  . Drug use: No    Family History  Problem Relation Age of Onset  . Cancer Mother        breast  . Cancer Father        lung  . Diabetes Father   . Cancer Sister        colon  . Cancer Maternal Aunt        breast - both  . Diabetes Sister   . Anesthesia problems Neg Hx    Allergies  Allergen Reactions  . Vicodin [Hydrocodone-Acetaminophen] Nausea And Vomiting    OBJECTIVE: Blood pressure (!) 131/58, pulse 88, temperature 99.4 F (37.4 C), temperature source Oral, resp. rate 18, height 5\' 7"  (1.702 m), weight (!) 181.4 kg, SpO2 98 %.  Physical Exam Constitutional:      Comments: Obese, resting in bed. Comfortable as long as she is at rest.   HENT:     Mouth/Throat:     Mouth: Mucous membranes are moist.  Eyes:     General: No scleral icterus. Cardiovascular:     Rate and Rhythm: Normal rate and regular rhythm.     Heart sounds: No murmur.  Pulmonary:     Effort: Pulmonary effort is normal. No respiratory distress.  Abdominal:     General: There is no distension.     Tenderness: There is no abdominal tenderness.  Musculoskeletal:     Comments: RLE with some mild discoloration reflecting chronic stasis  changes, non-tender and no warmth. No lymphadenopathy appreciated although limited with body habitus. LLE swollen, tender to light touch especially on posterior calf with erythema throughout anterior/posterior lower leg up to knee. + palpated DP/PT.   Skin:  Capillary Refill: Capillary refill takes less than 2 seconds.  Neurological:     Mental Status: She is alert and oriented to person, place, and time.  Psychiatric:        Mood and Affect: Mood normal.     Lab Results Lab Results  Component Value Date   WBC 10.5 07/11/2018   HGB 11.9 (L) 07/11/2018   HCT 39.2 07/11/2018   MCV 79.7 (L) 07/11/2018   PLT 200 07/11/2018    Lab Results  Component Value Date   CREATININE 1.17 (H) 07/11/2018   BUN 19 07/11/2018   NA 137 07/11/2018   K 4.5 07/11/2018   CL 103 07/11/2018   CO2 25 07/11/2018    Lab Results  Component Value Date   ALT 16 06/19/2018   AST 21 06/19/2018   ALKPHOS 90 06/19/2018   BILITOT 0.7 06/19/2018     Microbiology: Recent Results (from the past 240 hour(s))  Culture, blood (routine x 2)     Status: None (Preliminary result)   Collection Time: 07/09/18 10:10 PM   Specimen: BLOOD  Result Value Ref Range Status   Specimen Description BLOOD RIGHT HAND  Final   Special Requests   Final    BOTTLES DRAWN AEROBIC AND ANAEROBIC Blood Culture results may not be optimal due to an inadequate volume of blood received in culture bottles   Culture   Final    NO GROWTH 2 DAYS Performed at [facility], [place] [address], [place], [place] [address]    Report Status PENDING  Incomplete  Culture, blood (routine x 2)     Status: None (Preliminary result)   Collection Time: 07/09/18 10:20 PM   Specimen: BLOOD  Result Value Ref Range Status   Specimen Description BLOOD LEFT HAND  Final   Special Requests   Final    BOTTLES DRAWN AEROBIC AND ANAEROBIC Blood Culture results may not be optimal due to an inadequate volume of blood received in culture bottles   Culture    Final    NO GROWTH 2 DAYS Performed at [facility], [place] [address] [place], [place] [address]    Report Status PENDING  Incomplete    Janene Madeira, MSN, NP-C [facility] [place] Cell: [phone] Pager: [phone]  07/11/2018 1:51 PM

## 2018-07-11 NOTE — Progress Notes (Addendum)
[facility]                                                                              Patient Demographics  Cheyenne Guzman, is a 53 y.o. female, DOB - 1965-10-02, GEZ:[id number]  Admit date - 07/09/2018   Admitting Physician Shela Leff, MD  Outpatient Primary MD for the patient is Laurey Morale, MD  Outpatient specialists:   LOS - 1  days   Medical records reviewed and are as summarized below:    Chief Complaint  Patient presents with   Recurrent Skin Infections       Brief summary   Patient is a 53 year old female with diabetes type 2, IDDM, PE on Xarelto, hypothyroidism, hyperlipidemia, RA, asthma, depression, anxiety, chronic pain syndrome, OSA, morbid obesity who was admitted in May for left lower extremity cellulitis, placed on IV Rocephin while inpatient, then discharged on Keflex for 10-day course.  Patient reported that she finished the antibiotics, felt better and started noticing redness, painful and swelling of the left lower extremity.  Patient is on Xarelto at home.  She also reports pain in the right groin. In ED temp 102.4, tachycardia, WBC 13.0, creatinine 1.2   Assessment & Plan    Principal Problem: Sepsis secondary to left lower extremity cellulitis -Patient was placed on IV Rocephin however patient feels that cellulitis is worse today and up to the knee.  L LE tender swollen and erythematous. -Will DC IV Rocephin, placed on IV vancomycin and Zosyn (history of diabetes.) -Doppler ultrasound of the left lower extremity negative for DVT -No significant improvement in the cellulitis, still tight, tender and erythematous, will obtain CT of the left tib-fib to rule out any myositis/abscess or hematoma.  Continue to elevate left leg, Lasix 40 mg IV x1 - will consult ID, given recurrent cellulitis with no significant improvement.   Active Problems: Right groin pain -Appears to be musculoskeletal, DC Toradol due to worsening  creatinine -Obtain localized ultrasound   History of Pulmonary embolism (HCC) -Continue home Xarelto    Diabetes mellitus type 2 in obese (HCC) -Hemoglobin A1c 9.0 on 06/19/2018 -Patient reports taking Lantus 285 units daily, endocrinologist, Dr. Loanne Drilling -Continue sliding scale insulin, home dose regimen  History of asthma Currently stable, no wheezing  Depression, anxiety Continue Zoloft  OSA Continue CPAP  Morbid obesity BMI 62.6, recommended diet and weight control  Acute kidney injury -Likely due to sepsis, Toradol, DC NSAIDs -Creatinine had worsened to 1.6 on 6/18, now improved 1.1   Code Status: Full CODE STATUS DVT Prophylaxis: Xarelto Family Communication: Discussed in detail with the patient, all imaging results, lab results explained to the patient   Disposition Plan: Significant cellulitis left lower extremity up to knee, swollen, tender, will likely need few days of IV antibiotics given recurrent cellulitis.   Time Spent in minutes 25 minutes Procedures:  None  Consultants:   Infectious disease  Antimicrobials:   Anti-infectives (From admission, onward)   Start     Dose/Rate Route Frequency Ordered Stop   07/11/18 0900  vancomycin (VANCOCIN) 1,500 mg in sodium chloride 0.9 % 500 mL IVPB     1,500  mg 250 mL/hr over 120 Minutes Intravenous Every 24 hours 07/10/18 0829     07/10/18 2000  cefTRIAXone (ROCEPHIN) 1 g in sodium chloride 0.9 % 100 mL IVPB  Status:  Discontinued     1 g 200 mL/hr over 30 Minutes Intravenous Every 24 hours 07/09/18 2210 07/09/18 2211   07/10/18 2000  cefTRIAXone (ROCEPHIN) 2 g in sodium chloride 0.9 % 100 mL IVPB  Status:  Discontinued     2 g 200 mL/hr over 30 Minutes Intravenous Every 24 hours 07/09/18 2211 07/10/18 0746   07/10/18 0830  piperacillin-tazobactam (ZOSYN) IVPB 3.375 g     3.375 g 12.5 mL/hr over 240 Minutes Intravenous Every 8 hours 07/10/18 0823     07/10/18 0830  vancomycin (VANCOCIN) 2,500 mg in sodium  chloride 0.9 % 500 mL IVPB     2,500 mg 250 mL/hr over 120 Minutes Intravenous  Once 07/10/18 0829 07/10/18 1122   07/09/18 2215  cefTRIAXone (ROCEPHIN) 1 g in sodium chloride 0.9 % 100 mL IVPB     1 g 200 mL/hr over 30 Minutes Intravenous  Once 07/09/18 2211 07/10/18 0110   07/09/18 2000  cefTRIAXone (ROCEPHIN) 1 g in sodium chloride 0.9 % 100 mL IVPB     1 g 200 mL/hr over 30 Minutes Intravenous  Once 07/09/18 1956 07/09/18 2125         Medications  Scheduled Meds:  ALPRAZolam  1 mg Oral TID   gabapentin  300 mg Oral BID   insulin aspart  0-15 Units Subcutaneous TID WC   insulin aspart  0-5 Units Subcutaneous QHS   insulin glargine  100 Units Subcutaneous QHS   And   insulin glargine  100 Units Subcutaneous QHS   And   insulin glargine  85 Units Subcutaneous QHS   levothyroxine  75 mcg Oral Daily   methocarbamol  750 mg Oral BID   rivaroxaban  20 mg Oral Q supper   sertraline  200 mg Oral Daily   Continuous Infusions:  piperacillin-tazobactam (ZOSYN)  IV 3.375 g (07/11/18 0609)   vancomycin 1,500 mg (07/11/18 0948)   PRN Meds:.acetaminophen, albuterol, HYDROmorphone (DILAUDID) injection      Subjective:   Cheyenne Guzman was seen and examined today.  Frustrated and tearful that the cellulitis in the left lower leg has not improved.  Leg still feels tight, swollen, tender and red.  Low-grade temp of 99.4 F.   Patient denies dizziness, chest pain, shortness of breath, abdominal pain, N/V/D/C, new weakness, numbess, tingling.   Objective:   Vitals:   07/10/18 2004 07/11/18 0345 07/11/18 0500 07/11/18 0832  BP: 131/73 (!) 123/97 (!) 146/71 (!) 131/58  Pulse: 91 91  88  Resp: 16 18  18   Temp: 99.8 F (37.7 C) 99.3 F (37.4 C)  99.4 F (37.4 C)  TempSrc: Oral Oral  Oral  SpO2: 94% 95%  98%  Weight:      Height:        Intake/Output Summary (Last 24 hours) at 07/11/2018 1138 Last data filed at 07/11/2018 0800 Gross per 24 hour  Intake 1475.35 ml   Output --  Net 1475.35 ml     Wt Readings from Last 3 Encounters:  07/09/18 (!) 181.4 kg  06/20/18 (!) 199.7 kg  03/13/18 (!) 191.1 kg     Exam  Physical Exam  General: Alert and oriented x 3, NAD  Eyes:   HEENT:  Atraumatic, normocephalic  Cardiovascular: S1 S2 clear, no murmurs, RRR.   Respiratory:  CTAB, no wheezing, rales or rhonchi  Gastrointestinal: Soft, nontender, nondistended, NBS  Ext: Left LE, tight and swollen  Neuro: no new deficits  Musculoskeletal: No cyanosis, clubbing  Skin: No significant improvement in the left lower leg cellulitis  Psych: Tearful and anxious         Data Reviewed:  I have personally reviewed following labs and imaging studies  Micro Results Recent Results (from the past 240 hour(s))  Culture, blood (routine x 2)     Status: None (Preliminary result)   Collection Time: 07/09/18 10:10 PM   Specimen: BLOOD  Result Value Ref Range Status   Specimen Description BLOOD RIGHT HAND  Final   Special Requests   Final    BOTTLES DRAWN AEROBIC AND ANAEROBIC Blood Culture results may not be optimal due to an inadequate volume of blood received in culture bottles   Culture   Final    NO GROWTH 2 DAYS Performed at [facility], [place] [address] [place], [place] [address]    Report Status PENDING  Incomplete  Culture, blood (routine x 2)     Status: None (Preliminary result)   Collection Time: 07/09/18 10:20 PM   Specimen: BLOOD  Result Value Ref Range Status   Specimen Description BLOOD LEFT HAND  Final   Special Requests   Final    BOTTLES DRAWN AEROBIC AND ANAEROBIC Blood Culture results may not be optimal due to an inadequate volume of blood received in culture bottles   Culture   Final    NO GROWTH 2 DAYS Performed at [facility], [place] [address] [place], [place] [address]    Report Status PENDING  Incomplete    Radiology Reports Dg Chest Portable 1 View  Result Date: 06/19/2018 CLINICAL DATA:  Fever  and chills.  Leg swelling. EXAM: PORTABLE CHEST 1 VIEW COMPARISON:  CT chest dated March 12, 2018. Chest x-ray dated March 11, 2018. FINDINGS: Stable mild cardiomegaly. Normal mediastinal contours. Normal pulmonary vascularity. No focal consolidation, pleural effusion, or pneumothorax. No acute osseous abnormality. IMPRESSION: No active disease. Electronically Signed   By: Titus Dubin M.D.   On: 06/19/2018 15:42   Dg Hip Unilat With Pelvis 2-3 Views Right  Result Date: 07/09/2018 CLINICAL DATA:  Right groin pain for 2 days. Cellulitis in the left lower leg and sepsis. EXAM: DG HIP (WITH OR WITHOUT PELVIS) 2-3V RIGHT COMPARISON:  None. FINDINGS: There is no evidence of hip fracture or dislocation. Mild osteoarthrosis of the hip joints with acetabular osteophytosis. IMPRESSION: No acute fracture or dislocation identified. Electronically Signed   By: Kristine Garbe M.D.   On: 07/09/2018 22:53   Vas [place] Lower Extremity Venous (dvt)  Result Date: 07/10/2018  Lower Venous Study Indications: Pain, Edema, and Erythema. Other Indications: Cellulitus left lower extremity. Risk Factors: History of PE in February 2020. Limitations: Body habitus 399 lbs. Comparison Study: No comparison study Performing Technologist: Toma Copier RVS  Examination Guidelines: A complete evaluation includes B-mode imaging, spectral Doppler, color Doppler, and power Doppler as needed of all accessible portions of each vessel. Bilateral testing is considered an integral part of a complete examination. Limited examinations for reoccurring indications may be performed as noted.  +-----+---------------+---------+-----------+----------+-------+  RIGHT Compressibility Phasicity Spontaneity Properties Summary  +-----+---------------+---------+-----------+----------+-------+  CFV   Full            Yes       Yes                             +-----+---------------+---------+-----------+----------+-------+  SFJ   Full                                                       +-----+---------------+---------+-----------+----------+-------+   +---------+---------------+---------+-----------+----------+-------------------+  LEFT      Compressibility Phasicity Spontaneity Properties Summary              +---------+---------------+---------+-----------+----------+-------------------+  CFV       Full                                                                  +---------+---------------+---------+-----------+----------+-------------------+  SFJ       Full                                                                  +---------+---------------+---------+-----------+----------+-------------------+  FV Prox   Full                                                                  +---------+---------------+---------+-----------+----------+-------------------+  FV Mid    Full                                                                  +---------+---------------+---------+-----------+----------+-------------------+  FV Distal                                                  Not visualized       +---------+---------------+---------+-----------+----------+-------------------+  PFV                                                        Not visualized       +---------+---------------+---------+-----------+----------+-------------------+  POP       Full                                                                  +---------+---------------+---------+-----------+----------+-------------------+  PTV  Not fully                                                                        visualized due to                                                                body habitus and                                                                 pain                 +---------+---------------+---------+-----------+----------+-------------------+   Left Technical Findings: Not visualized segments  include multiple segments of the veins. Unable to visualize the peroneal or the distal femoral or profunda femoris. Severely limited and inconclusive. Enlargement of the inguinal lymph nodes noted.   Summary: Right: There is no obvious evidence of a common femoral vein obstruction. Left: There is no obvious evidence of deep vein thrombosis in the segments of the veins able to be imaged in the lower extremity. However, portions of this examination were limited- see technologist comments above.  *See table(s) above for measurements and observations.    Preliminary    Vas [place] Lower Extremity Venous (dvt) (mc And Wl 7a-7p)  Result Date: 06/20/2018  Lower Venous Study Indications: Swelling, and Edema.  Limitations: Body habitus, poor ultrasound/tissue interface and patient positioning. Performing Technologist: Abram Sander RVS  Examination Guidelines: A complete evaluation includes B-mode imaging, spectral Doppler, color Doppler, and power Doppler as needed of all accessible portions of each vessel. Bilateral testing is considered an integral part of a complete examination. Limited examinations for reoccurring indications may be performed as noted.  +---------+---------------+---------+-----------+----------+--------------+  LEFT      Compressibility Phasicity Spontaneity Properties Summary         +---------+---------------+---------+-----------+----------+--------------+  CFV       Full                                                             +---------+---------------+---------+-----------+----------+--------------+  SFJ       Full                                                             +---------+---------------+---------+-----------+----------+--------------+  FV Prox   Full                                                             +---------+---------------+---------+-----------+----------+--------------+  FV Mid                                                     Not visualized   +---------+---------------+---------+-----------+----------+--------------+  FV Distal                                                  Not visualized  +---------+---------------+---------+-----------+----------+--------------+  PFV       Full                                                             +---------+---------------+---------+-----------+----------+--------------+  POP                                                        Not visualized  +---------+---------------+---------+-----------+----------+--------------+  PTV                                                        Not visualized  +---------+---------------+---------+-----------+----------+--------------+  PERO                                                       Not visualized  +---------+---------------+---------+-----------+----------+--------------+     Summary: Left: There is no evidence of deep vein thrombosis in the lower extremity. However, portions of this examination were limited- see technologist comments above. No cystic structure found in the popliteal fossa.  *See table(s) above for measurements and observations. Electronically signed by Monica Martinez MD on 06/20/2018 at 5:24:03 PM.    Final     Lab Data:  CBC: Recent Labs  Lab 07/09/18 2030 07/10/18 0346 07/11/18 0350  WBC 13.0* 14.0* 10.5  NEUTROABS 11.5*  --   --   HGB 14.2 12.9 11.9*  HCT 47.3* 42.7 39.2  MCV 80.2 79.4* 79.7*  PLT PLATELET CLUMPS NOTED ON SMEAR, UNABLE TO ESTIMATE 213 702   Basic Metabolic Panel: Recent Labs  Lab 07/09/18 2030 07/10/18 0346 07/11/18 0350  NA 136 135 137  K 5.1 4.4 4.5  CL 102 101 103  CO2 21* 24 25  GLUCOSE 190* 326* 145*  BUN 14 20 19   CREATININE 1.21* 1.64* 1.17*  CALCIUM 9.2 8.5* 8.7*   GFR: Estimated Creatinine Clearance: 96.1 mL/min (A) (by C-G formula based on SCr of 1.17 mg/dL (H)). Liver Function Tests: No results for input(s): AST, ALT, ALKPHOS, BILITOT, PROT, ALBUMIN in the last 168 hours. No  results for input(s): LIPASE, AMYLASE in the last 168 hours. No results for input(s): AMMONIA in the last 168 hours.  Coagulation Profile: No results for input(s): INR, PROTIME in the last 168 hours. Cardiac Enzymes: No results for input(s): CKTOTAL, CKMB, CKMBINDEX, TROPONINI in the last 168 hours. BNP (last 3 results) No results for input(s): PROBNP in the last 8760 hours. HbA1C: No results for input(s): HGBA1C in the last 72 hours. CBG: Recent Labs  Lab 07/10/18 0734 07/10/18 1149 07/10/18 1636 07/10/18 2028 07/11/18 0824  GLUCAP 248* 162* 183* 142* 118*   Lipid Profile: No results for input(s): CHOL, HDL, LDLCALC, TRIG, CHOLHDL, LDLDIRECT in the last 72 hours. Thyroid Function Tests: No results for input(s): TSH, T4TOTAL, FREET4, T3FREE, THYROIDAB in the last 72 hours. Anemia Panel: No results for input(s): VITAMINB12, FOLATE, FERRITIN, TIBC, IRON, RETICCTPCT in the last 72 hours. Urine analysis:    Component Value Date/Time   COLORURINE YELLOW 06/19/2018 1915   APPEARANCEUR HAZY (A) 06/19/2018 1915   APPEARANCEUR Clear 07/31/2013 1404   LABSPEC 1.020 06/19/2018 1915   PHURINE 7.0 06/19/2018 1915   GLUCOSEU NEGATIVE 06/19/2018 1915   HGBUR NEGATIVE 06/19/2018 1915   HGBUR negative 09/28/2009 1044   BILIRUBINUR NEGATIVE 06/19/2018 1915   BILIRUBINUR 1+ 10/31/2017 1721   BILIRUBINUR Negative 07/31/2013 [place] 06/19/2018 1915   PROTEINUR NEGATIVE 06/19/2018 1915   UROBILINOGEN 0.2 10/31/2017 1721   UROBILINOGEN 0.2 07/03/2012 1027   NITRITE NEGATIVE 06/19/2018 1915   LEUKOCYTESUR NEGATIVE 06/19/2018 1915     Kesa Birky M.D. [facility] 07/11/2018, 11:38 AM  Pager: [phone] Between 7am to 7pm - call Pager - [phone]  After 7pm go to [email or web address] - password [email or web address]  Call night coverage person covering after 7pm

## 2018-07-12 ENCOUNTER — Inpatient Hospital Stay (HOSPITAL_COMMUNITY): Payer: Medicare Other

## 2018-07-12 LAB — BASIC METABOLIC PANEL
Anion gap: 10 (ref 5–15)
BUN: 15 mg/dL (ref 6–20)
CO2: 25 mmol/L (ref 22–32)
Calcium: 8.6 mg/dL — ABNORMAL LOW (ref 8.9–10.3)
Chloride: 103 mmol/L (ref 98–111)
Creatinine, Ser: 0.95 mg/dL (ref 0.44–1.00)
GFR calc Af Amer: >60 mL/min (ref >60)
GFR calc non Af Amer: >60 mL/min (ref >60)
Glucose, Bld: 137 mg/dL — ABNORMAL HIGH (ref 70–99)
Potassium: 3.6 mmol/L (ref 3.5–5.1)
Sodium: 138 mmol/L (ref 135–145)

## 2018-07-12 LAB — CBC
HCT: 37.4 % (ref 36.0–46.0)
Hemoglobin: 11.2 g/dL — ABNORMAL LOW (ref 12.0–15.0)
MCH: 23.7 pg — ABNORMAL LOW (ref 26.0–34.0)
MCHC: 29.9 g/dL — ABNORMAL LOW (ref 30.0–36.0)
MCV: 79.2 fL — ABNORMAL LOW (ref 80.0–100.0)
Platelets: 198 10*3/uL (ref 150–400)
RBC: 4.72 MIL/uL (ref 3.87–5.11)
RDW: 16.7 % — ABNORMAL HIGH (ref 11.5–15.5)
WBC: 7 10*3/uL (ref 4.0–10.5)
nRBC: 0 % (ref 0.0–0.2)

## 2018-07-12 LAB — GLUCOSE, CAPILLARY
Glucose-Capillary: 73 mg/dL (ref 70–99)
Glucose-Capillary: 182 mg/dL — ABNORMAL HIGH (ref 70–99)
Glucose-Capillary: 195 mg/dL — ABNORMAL HIGH (ref 70–99)
Glucose-Capillary: 255 mg/dL — ABNORMAL HIGH (ref 70–99)

## 2018-07-12 MED ORDER — IOHEXOL 300 MG/ML  SOLN
100.0000 mL | Freq: Once | INTRAMUSCULAR | Status: AC | PRN
  Administered 2018-07-12: 100 mL via INTRAVENOUS

## 2018-07-12 NOTE — Progress Notes (Signed)
Will get 0600 vitals at 0800.  Pt is still asleep, and was having issues with that.  Slept very well last night.

## 2018-07-12 NOTE — Progress Notes (Signed)
The patient will be taken to CT for repeat

## 2018-07-12 NOTE — Progress Notes (Signed)
[facility]                                                                              Patient Demographics  Cheyenne Guzman, is a 53 y.o. female, DOB - November 09, 1965, XNA:[id number]  Admit date - 07/09/2018   Admitting Physician Shela Leff, MD  Outpatient Primary MD for the patient is Laurey Morale, MD  Outpatient specialists:   LOS - 2  days   Medical records reviewed and are as summarized below:    Chief Complaint  Patient presents with   Recurrent Skin Infections       Brief summary   Patient is a 53 year old female with diabetes type 2, IDDM, PE on Xarelto, hypothyroidism, hyperlipidemia, RA, asthma, depression, anxiety, chronic pain syndrome, OSA, morbid obesity who was admitted in May for left lower extremity cellulitis, placed on IV Rocephin while inpatient, then discharged on Keflex for 10-day course.  Patient reported that she finished the antibiotics, felt better and started noticing redness, painful and swelling of the left lower extremity.  Patient is on Xarelto at home.  She also reports pain in the right groin. In ED temp 102.4, tachycardia, WBC 13.0, creatinine 1.2   Assessment & Plan    Principal Problem: Sepsis secondary to left lower extremity cellulitis -Patient was placed on IV Rocephin however patient feels that cellulitis is worse today and up to the knee.  L LE tender swollen and erythematous. -Doppler ultrasound of the left lower extremity negative for DVT -Some improvement today, appreciate ID recommendations, continue IV Ancef. -CT tib-fib negative for any myositis/abscess or hematoma. -Per patient Lasix helped, will hold Lasix today due to CT abdomen with contrast to avoid contrast nephropathy   Active Problems: Right groin pain -Ultrasound abdomen showed complex mass in the right groin, lymphadenopathy versus abscess versus hernia or vascular lesion -Will obtain CT abdomen pelvis with contrast.   History of Pulmonary  embolism (HCC) -Continue Xarelto, H&H stable.    Diabetes mellitus type 2 in obese (HCC) -Hemoglobin A1c 9.0 on 06/19/2018 -Patient reports taking Lantus 285 units daily, endocrinologist, Dr. Loanne Drilling -CBGs fairly stable, continue current regimen.   History of asthma Currently stable, no wheezing  Depression, anxiety Continue Zoloft  OSA Continue CPAP  Morbid obesity BMI 62.6, recommended diet and weight control  Acute kidney injury -Likely due to sepsis, Toradol, DC NSAIDs -Creatinine had worsened to 1.6 on 6/18, creatinine now normalized.   Code Status: Full CODE STATUS DVT Prophylaxis: Xarelto Family Communication: Discussed in detail with the patient, all imaging results, lab results explained to the patient and patient's daughter on the phone.   Disposition Plan: Improving but not ready for discharge yet.  Needs 1 or 2 days of IV antibiotics, CT abdomen today.   Time Spent in minutes 25 minutes Procedures:  None  Consultants:   Infectious disease  Antimicrobials:   Anti-infectives (From admission, onward)   Start     Dose/Rate Route Frequency Ordered Stop   07/12/18 0400  vancomycin (VANCOCIN) IVPB 1000 mg/200 mL premix  Status:  Discontinued     1,000 mg 200 mL/hr over 60 Minutes Intravenous Every 12 hours  07/11/18 1300 07/11/18 1418   07/11/18 1600  ceFAZolin (ANCEF) IVPB 2g/100 mL premix     2 g 200 mL/hr over 30 Minutes Intravenous Every 8 hours 07/11/18 1418     07/11/18 0900  vancomycin (VANCOCIN) 1,500 mg in sodium chloride 0.9 % 500 mL IVPB  Status:  Discontinued     1,500 mg 250 mL/hr over 120 Minutes Intravenous Every 24 hours 07/10/18 0829 07/11/18 1300   07/10/18 2000  cefTRIAXone (ROCEPHIN) 1 g in sodium chloride 0.9 % 100 mL IVPB  Status:  Discontinued     1 g 200 mL/hr over 30 Minutes Intravenous Every 24 hours 07/09/18 2210 07/09/18 2211   07/10/18 2000  cefTRIAXone (ROCEPHIN) 2 g in sodium chloride 0.9 % 100 mL IVPB  Status:  Discontinued       2 g 200 mL/hr over 30 Minutes Intravenous Every 24 hours 07/09/18 2211 07/10/18 0746   07/10/18 0830  piperacillin-tazobactam (ZOSYN) IVPB 3.375 g  Status:  Discontinued     3.375 g 12.5 mL/hr over 240 Minutes Intravenous Every 8 hours 07/10/18 0823 07/11/18 1418   07/10/18 0830  vancomycin (VANCOCIN) 2,500 mg in sodium chloride 0.9 % 500 mL IVPB     2,500 mg 250 mL/hr over 120 Minutes Intravenous  Once 07/10/18 0829 07/10/18 1122   07/09/18 2215  cefTRIAXone (ROCEPHIN) 1 g in sodium chloride 0.9 % 100 mL IVPB     1 g 200 mL/hr over 30 Minutes Intravenous  Once 07/09/18 2211 07/10/18 0110   07/09/18 2000  cefTRIAXone (ROCEPHIN) 1 g in sodium chloride 0.9 % 100 mL IVPB     1 g 200 mL/hr over 30 Minutes Intravenous  Once 07/09/18 1956 07/09/18 2125         Medications  Scheduled Meds:  ALPRAZolam  1 mg Oral TID   gabapentin  300 mg Oral BID   insulin aspart  0-15 Units Subcutaneous TID WC   insulin aspart  0-5 Units Subcutaneous QHS   insulin glargine  100 Units Subcutaneous QHS   And   insulin glargine  100 Units Subcutaneous QHS   And   insulin glargine  85 Units Subcutaneous QHS   levothyroxine  75 mcg Oral Daily   methocarbamol  750 mg Oral BID   rivaroxaban  20 mg Oral Q supper   sertraline  200 mg Oral Daily   Continuous Infusions:   ceFAZolin (ANCEF) IV 2 g (07/12/18 0903)   PRN Meds:.acetaminophen, albuterol, HYDROmorphone (DILAUDID) injection      Subjective:   Ermel Verne was seen and examined today.  Left leg cellulitis slightly improving, less red and tight today.  No fevers or chills.  Continues to complain of right groin pain.     Patient denies dizziness, chest pain, shortness of breath, abdominal pain, N/V/D/C, new weakness, numbess, tingling.   Objective:   Vitals:   07/11/18 1622 07/11/18 1831 07/11/18 1945 07/12/18 0817  BP: 123/69  (!) 145/76 129/76  Pulse: 89  88 77  Resp: 17   18  Temp: 99.7 F (37.6 C) 98.5 F (36.9 C)  99.1 F (37.3 C) 98.3 F (36.8 C)  TempSrc: Oral Oral Oral Oral  SpO2: 96%  98% 97%  Weight:      Height:        Intake/Output Summary (Last 24 hours) at 07/12/2018 1522 Last data filed at 07/12/2018 1200 Gross per 24 hour  Intake 480 ml  Output 501 ml  Net -21 ml  Wt Readings from Last 3 Encounters:  07/09/18 (!) 181.4 kg  06/20/18 (!) 199.7 kg  03/13/18 (!) 191.1 kg    Physical Exam  General: Alert and oriented x 3, NAD  Eyes:   HEENT:  Atraumatic, normocephalic  Cardiovascular: S1 S2 clear, no murmurs, RRR.  1+ pedal edema left lower extremity  Respiratory: CTAB, no wheezing, rales or rhonchi  Gastrointestinal: Soft, nontender, nondistended, NBS  Ext: 1+ pedal edema bilaterally left lower extremity  Neuro: no new deficits  Musculoskeletal: Right groin mild TTP  Skin: See below, left lower extremity cellulitis improving  Psych: Normal affect and demeanor, alert and oriented x3          Data Reviewed:  I have personally reviewed following labs and imaging studies  Micro Results Recent Results (from the past 240 hour(s))  Novel Coronavirus,NAA,(SEND-OUT TO REF LAB - TAT 24-48 hrs); Hosp Order     Status: None   Collection Time: 07/09/18  8:19 PM   Specimen: Nasopharyngeal Swab; Respiratory  Result Value Ref Range Status   SARS-CoV-2, NAA NOT DETECTED NOT DETECTED Final    Comment: (NOTE) This test was developed and its performance characteristics determined by [employer]. This test has not been FDA cleared or approved. This test has been authorized by FDA under an Emergency Use Authorization (EUA). This test is only authorized for the duration of time the declaration that circumstances exist justifying the authorization of the emergency use of in vitro diagnostic tests for detection of SARS-CoV-2 virus and/or diagnosis of COVID-19 infection under section 564(b)(1) of the Act, 21 U.S.C. 250NLZ-7(Q)(7), unless the authorization is  terminated or revoked sooner. When diagnostic testing is negative, the possibility of a false negative result should be considered in the context of a patient's recent exposures and the presence of clinical signs and symptoms consistent with COVID-19. An individual without symptoms of COVID-19 and who is not shedding SARS-CoV-2 virus would expect to have a negative (not detected) result in this assay. Performed  At: [facility] [address] [place], [place] [id number] Rush Farmer MD IO:[id number]    [place]  Final    Comment: Performed at [facility], [place] [address], [place], [place] [address]  Culture, blood (routine x 2)     Status: None (Preliminary result)   Collection Time: 07/09/18 10:10 PM   Specimen: BLOOD  Result Value Ref Range Status   Specimen Description BLOOD RIGHT HAND  Final   Special Requests   Final    BOTTLES DRAWN AEROBIC AND ANAEROBIC Blood Culture results may not be optimal due to an inadequate volume of blood received in culture bottles   Culture   Final    NO GROWTH 3 DAYS Performed at [facility], [place] [address], [place], [place] [address]    Report Status PENDING  Incomplete  Culture, blood (routine x 2)     Status: None (Preliminary result)   Collection Time: 07/09/18 10:20 PM   Specimen: BLOOD  Result Value Ref Range Status   Specimen Description BLOOD LEFT HAND  Final   Special Requests   Final    BOTTLES DRAWN AEROBIC AND ANAEROBIC Blood Culture results may not be optimal due to an inadequate volume of blood received in culture bottles   Culture   Final    NO GROWTH 3 DAYS Performed at [facility], [place] [address], [place], [place] [address]    Report Status PENDING  Incomplete    Radiology  Reports US Pelvis Limited (transabdominal Only)  Result Date: 07/11/2018 CLINICAL DATA:  Right groin pain.  History of breast cancer. EXAM: LIMITED ULTRASOUND OF PELVIS TECHNIQUE: Limited  transabdominal ultrasound examination of the pelvis was performed. COMPARISON:  CT 12/27/2014, 02/07/2012. FINDINGS: Complex mass is noted in the right groin. This was difficult to measure. This could represent lymphadenopathy. An abscess cannot be excluded. A hernia cannot be excluded. A vascular lesion is less likely. No prominent internal color flow noted. IV and oral contrast-enhanced CT of the abdomen and pelvis may prove useful for further evaluation. IMPRESSION: Prominent complex mass right groin for which further evaluation with IV and oral contrast-enhanced CT of the abdomen pelvis may prove useful for further evaluation. Electronically Signed   By: Marcello Moores  Register   On: 07/11/2018 16:12   Ct Tibia Fibula Left Wo Contrast  Result Date: 07/11/2018 CLINICAL DATA:  Redness, pain, and swelling of the left lower extremity. Recent cellulitis. Fever. Elevated white blood count. EXAM: CT OF THE LOWER LEFT EXTREMITY WITHOUT CONTRAST TECHNIQUE: Multidetector CT imaging of the left lower leg was performed according to the standard protocol. COMPARISON:  None. FINDINGS: Bones/Joint/Cartilage There are slight arthritic changes at the left knee and left ankle without appreciable joint effusions. No osteomyelitis or fracture. Muscles and Tendons Normal. Soft tissues There is extensive circumferential subcutaneous edema most prominent anteriorly. There is no definable abscess. There is thickening of the scan with a few benign calcifications in the soft tissues. Varicose veins are seen in the posteromedial aspect of the distal right thigh. IMPRESSION: No evidence of soft tissue abscess, myositis or osteomyelitis or joint effusions. Extensive subcutaneous edema in the lower leg, nonspecific. This could represent cellulitis or benign edema. Electronically Signed   By: Lorriane Shire M.D.   On: 07/11/2018 12:20   Dg Chest Portable 1 View  Result Date: 06/19/2018 CLINICAL DATA:  Fever and chills.  Leg swelling. EXAM:  PORTABLE CHEST 1 VIEW COMPARISON:  CT chest dated March 12, 2018. Chest x-ray dated March 11, 2018. FINDINGS: Stable mild cardiomegaly. Normal mediastinal contours. Normal pulmonary vascularity. No focal consolidation, pleural effusion, or pneumothorax. No acute osseous abnormality. IMPRESSION: No active disease. Electronically Signed   By: Titus Dubin M.D.   On: 06/19/2018 15:42   Dg Hip Unilat With Pelvis 2-3 Views Right  Result Date: 07/09/2018 CLINICAL DATA:  Right groin pain for 2 days. Cellulitis in the left lower leg and sepsis. EXAM: DG HIP (WITH OR WITHOUT PELVIS) 2-3V RIGHT COMPARISON:  None. FINDINGS: There is no evidence of hip fracture or dislocation. Mild osteoarthrosis of the hip joints with acetabular osteophytosis. IMPRESSION: No acute fracture or dislocation identified. Electronically Signed   By: Kristine Garbe M.D.   On: 07/09/2018 22:53   Vas [place] Lower Extremity Venous (dvt)  Result Date: 07/11/2018  Lower Venous Study Indications: Pain, Edema, and Erythema. Other Indications: Cellulitus left lower extremity. Risk Factors: History of PE in February 2020. Limitations: Body habitus 399 lbs. Comparison Study: No comparison study Performing Technologist: Toma Copier RVS  Examination Guidelines: A complete evaluation includes B-mode imaging, spectral Doppler, color Doppler, and power Doppler as needed of all accessible portions of each vessel. Bilateral testing is considered an integral part of a complete examination. Limited examinations for reoccurring indications may be performed as noted.  +-----+---------------+---------+-----------+----------+-------+  RIGHT Compressibility Phasicity Spontaneity Properties Summary  +-----+---------------+---------+-----------+----------+-------+  CFV   Full            Yes  Yes                             +-----+---------------+---------+-----------+----------+-------+  SFJ   Full                                                       +-----+---------------+---------+-----------+----------+-------+   +---------+---------------+---------+-----------+----------+-------------------+  LEFT      Compressibility Phasicity Spontaneity Properties Summary              +---------+---------------+---------+-----------+----------+-------------------+  CFV       Full                                                                  +---------+---------------+---------+-----------+----------+-------------------+  SFJ       Full                                                                  +---------+---------------+---------+-----------+----------+-------------------+  FV Prox   Full                                                                  +---------+---------------+---------+-----------+----------+-------------------+  FV Mid    Full                                                                  +---------+---------------+---------+-----------+----------+-------------------+  FV Distal                                                  Not visualized       +---------+---------------+---------+-----------+----------+-------------------+  PFV                                                        Not visualized       +---------+---------------+---------+-----------+----------+-------------------+  POP       Full                                                                  +---------+---------------+---------+-----------+----------+-------------------+  PTV                                                        Not fully                                                                        visualized due to                                                                body habitus and                                                                 pain                 +---------+---------------+---------+-----------+----------+-------------------+   Left Technical Findings: Not visualized segments include multiple segments of the veins.  Unable to visualize the peroneal or the distal femoral or profunda femoris. Severely limited and inconclusive. Enlargement of the inguinal lymph nodes noted.   Summary: Right: There is no obvious evidence of a common femoral vein obstruction. Left: There is no obvious evidence of deep vein thrombosis in the segments of the veins able to be imaged in the lower extremity. However, portions of this examination were limited- see technologist comments above.  *See table(s) above for measurements and observations. Electronically signed by Monica Martinez MD on 07/11/2018 at 4:50:35 PM.    Final    Vas [place] Lower Extremity Venous (dvt) (mc And Wl 7a-7p)  Result Date: 06/20/2018  Lower Venous Study Indications: Swelling, and Edema.  Limitations: Body habitus, poor ultrasound/tissue interface and patient positioning. Performing Technologist: Abram Sander RVS  Examination Guidelines: A complete evaluation includes B-mode imaging, spectral Doppler, color Doppler, and power Doppler as needed of all accessible portions of each vessel. Bilateral testing is considered an integral part of a complete examination. Limited examinations for reoccurring indications may be performed as noted.  +---------+---------------+---------+-----------+----------+--------------+  LEFT      Compressibility Phasicity Spontaneity Properties Summary         +---------+---------------+---------+-----------+----------+--------------+  CFV       Full                                                             +---------+---------------+---------+-----------+----------+--------------+  SFJ       Full                                                             +---------+---------------+---------+-----------+----------+--------------+  FV Prox   Full                                                             +---------+---------------+---------+-----------+----------+--------------+  FV Mid                                                     Not  visualized  +---------+---------------+---------+-----------+----------+--------------+  FV Distal                                                  Not visualized  +---------+---------------+---------+-----------+----------+--------------+  PFV       Full                                                             +---------+---------------+---------+-----------+----------+--------------+  POP                                                        Not visualized  +---------+---------------+---------+-----------+----------+--------------+  PTV                                                        Not visualized  +---------+---------------+---------+-----------+----------+--------------+  PERO                                                       Not visualized  +---------+---------------+---------+-----------+----------+--------------+     Summary: Left: There is no evidence of deep vein thrombosis in the lower extremity. However, portions of this examination were limited- see technologist comments above. No cystic structure found in the popliteal fossa.  *See table(s) above for measurements and observations. Electronically signed by Monica Martinez MD on 06/20/2018 at 5:24:03 PM.    Final     Lab Data:  CBC: Recent Labs  Lab 07/09/18 2030 07/10/18 0346 07/11/18 0350 07/12/18 0917  WBC 13.0* 14.0* 10.5 7.0  NEUTROABS 11.5*  --   --   --   HGB 14.2 12.9 11.9* 11.2*  HCT 47.3* 42.7 39.2 37.4  MCV 80.2 79.4* 79.7* 79.2*  PLT PLATELET CLUMPS NOTED ON SMEAR, UNABLE TO ESTIMATE 213 200 353   Basic Metabolic Panel: Recent Labs  Lab 07/09/18 2030 07/10/18 0346 07/11/18 0350 07/12/18 0917  NA 136 135 137 138  K 5.1 4.4 4.5 3.6  CL 102 101 103 103  CO2  21* 24 25 25   GLUCOSE 190* 326* 145* 137*  BUN 14 20 19 15   CREATININE 1.21* 1.64* 1.17* 0.95  CALCIUM 9.2 8.5* 8.7* 8.6*   GFR: Estimated Creatinine Clearance: 118.4 mL/min (by C-G formula based on SCr of 0.95 mg/dL). Liver Function  Tests: No results for input(s): AST, ALT, ALKPHOS, BILITOT, PROT, ALBUMIN in the last 168 hours. No results for input(s): LIPASE, AMYLASE in the last 168 hours. No results for input(s): AMMONIA in the last 168 hours. Coagulation Profile: No results for input(s): INR, PROTIME in the last 168 hours. Cardiac Enzymes: No results for input(s): CKTOTAL, CKMB, CKMBINDEX, TROPONINI in the last 168 hours. BNP (last 3 results) No results for input(s): PROBNP in the last 8760 hours. HbA1C: No results for input(s): HGBA1C in the last 72 hours. CBG: Recent Labs  Lab 07/11/18 1145 07/11/18 1734 07/11/18 2155 07/12/18 0816 07/12/18 1226  GLUCAP 166* 156* 243* 73 182*   Lipid Profile: No results for input(s): CHOL, HDL, LDLCALC, TRIG, CHOLHDL, LDLDIRECT in the last 72 hours. Thyroid Function Tests: No results for input(s): TSH, T4TOTAL, FREET4, T3FREE, THYROIDAB in the last 72 hours. Anemia Panel: No results for input(s): VITAMINB12, FOLATE, FERRITIN, TIBC, IRON, RETICCTPCT in the last 72 hours. Urine analysis:    Component Value Date/Time   COLORURINE YELLOW 06/19/2018 1915   APPEARANCEUR HAZY (A) 06/19/2018 1915   APPEARANCEUR Clear 07/31/2013 1404   LABSPEC 1.020 06/19/2018 1915   PHURINE 7.0 06/19/2018 1915   GLUCOSEU NEGATIVE 06/19/2018 1915   HGBUR NEGATIVE 06/19/2018 1915   HGBUR negative 09/28/2009 1044   BILIRUBINUR NEGATIVE 06/19/2018 1915   BILIRUBINUR 1+ 10/31/2017 1721   BILIRUBINUR Negative 07/31/2013 [place] 06/19/2018 1915   PROTEINUR NEGATIVE 06/19/2018 1915   UROBILINOGEN 0.2 10/31/2017 1721   UROBILINOGEN 0.2 07/03/2012 1027   NITRITE NEGATIVE 06/19/2018 1915   LEUKOCYTESUR NEGATIVE 06/19/2018 1915     Arlenis Blaydes M.D. [facility] 07/12/2018, 3:22 PM  Pager: [phone] Between 7am to 7pm - call Pager - [phone]  After 7pm go to [email or web address] - password [email or web address]  Call night coverage person covering after 7pm

## 2018-07-12 NOTE — Plan of Care (Signed)
  Problem: Elimination: Goal: Will not experience complications related to bowel motility Outcome: Progressing Goal: Will not experience complications related to urinary retention Outcome: Progressing   Problem: Elimination: Goal: Will not experience complications related to urinary retention Outcome: Progressing   Problem: Skin Integrity: Goal: Risk for impaired skin integrity will decrease Outcome: Progressing

## 2018-07-12 NOTE — Progress Notes (Signed)
Patient to CT via wheelchair.

## 2018-07-12 NOTE — Progress Notes (Signed)
The patient is sitting up in a recliner at the bedside.  Skin exam done while standing.  The patient co having coccyx pain, that area appears bruised.  No open areas.  Left lower extremity remains swollen and red.  Left lower leg is elevated on a chair with a pillow underneath.

## 2018-07-12 NOTE — Progress Notes (Signed)
Patient refused CPAP for tonight. RT informed patient to have RT called if she changes her mind. RT will monitor as needed. 

## 2018-07-12 NOTE — Progress Notes (Signed)
Dr Tana Coast and Nurse at the bedside to give update to the patient's daughter Joellen Jersey) regarding update and plan of care.  The patient will be having CT abd/groin today.

## 2018-07-13 LAB — CBC
HCT: 39 % (ref 36.0–46.0)
Hemoglobin: 11.6 g/dL — ABNORMAL LOW (ref 12.0–15.0)
MCH: 23.6 pg — ABNORMAL LOW (ref 26.0–34.0)
MCHC: 29.7 g/dL — ABNORMAL LOW (ref 30.0–36.0)
MCV: 79.4 fL — ABNORMAL LOW (ref 80.0–100.0)
Platelets: 212 10*3/uL (ref 150–400)
RBC: 4.91 MIL/uL (ref 3.87–5.11)
RDW: 16.7 % — ABNORMAL HIGH (ref 11.5–15.5)
WBC: 6.8 10*3/uL (ref 4.0–10.5)
nRBC: 0 % (ref 0.0–0.2)

## 2018-07-13 LAB — GLUCOSE, CAPILLARY
Glucose-Capillary: 87 mg/dL (ref 70–99)
Glucose-Capillary: 128 mg/dL — ABNORMAL HIGH (ref 70–99)
Glucose-Capillary: 162 mg/dL — ABNORMAL HIGH (ref 70–99)
Glucose-Capillary: 205 mg/dL — ABNORMAL HIGH (ref 70–99)

## 2018-07-13 LAB — BASIC METABOLIC PANEL
Anion gap: 10 (ref 5–15)
BUN: 13 mg/dL (ref 6–20)
CO2: 25 mmol/L (ref 22–32)
Calcium: 8.7 mg/dL — ABNORMAL LOW (ref 8.9–10.3)
Chloride: 103 mmol/L (ref 98–111)
Creatinine, Ser: 0.93 mg/dL (ref 0.44–1.00)
GFR calc Af Amer: >60 mL/min (ref >60)
GFR calc non Af Amer: >60 mL/min (ref >60)
Glucose, Bld: 142 mg/dL — ABNORMAL HIGH (ref 70–99)
Potassium: 4.2 mmol/L (ref 3.5–5.1)
Sodium: 138 mmol/L (ref 135–145)

## 2018-07-13 MED ORDER — ALPRAZOLAM 0.5 MG PO TABS
0.5000 mg | ORAL_TABLET | Freq: Two times a day (BID) | ORAL | Status: DC
  Administered 2018-07-13 – 2018-07-15 (×2): 0.5 mg via ORAL
  Filled 2018-07-13 (×4): qty 1

## 2018-07-13 MED ORDER — BUPRENORPHINE 15 MCG/HR TD PTWK: 1.0000 | MEDICATED_PATCH | TRANSDERMAL | Status: DC

## 2018-07-13 MED ORDER — ALPRAZOLAM 0.5 MG PO TABS
1.0000 mg | ORAL_TABLET | Freq: Every day | ORAL | Status: DC
  Administered 2018-07-13 – 2018-07-15 (×2): 1 mg via ORAL
  Filled 2018-07-13 (×2): qty 2

## 2018-07-13 MED ORDER — BUPRENORPHINE 15 MCG/HR TD PTWK
1.0000 | MEDICATED_PATCH | TRANSDERMAL | Status: DC
  Administered 2018-07-13: 1 via TRANSDERMAL

## 2018-07-13 MED ORDER — FUROSEMIDE 10 MG/ML IJ SOLN
40.0000 mg | Freq: Once | INTRAMUSCULAR | Status: AC
  Administered 2018-07-13: 40 mg via INTRAVENOUS
  Filled 2018-07-13: qty 4

## 2018-07-13 NOTE — Plan of Care (Signed)
  Problem: Clinical Measurements: Goal: Ability to maintain clinical measurements within normal limits will improve Outcome: Progressing Goal: Will remain free from infection Outcome: Progressing Goal: Diagnostic test results will improve Outcome: Progressing Goal: Respiratory complications will improve Outcome: Progressing   Problem: Skin Integrity: Goal: Risk for impaired skin integrity will decrease Outcome: Progressing

## 2018-07-13 NOTE — Progress Notes (Signed)
[facility]                                                                              Patient Demographics  Cheyenne Guzman, is a 53 y.o. female, DOB - 1965/05/26, QZE:[id number]  Admit date - 07/09/2018   Admitting Physician Shela Leff, MD  Outpatient Primary MD for the patient is Laurey Morale, MD  Outpatient specialists:   LOS - 3  days   Medical records reviewed and are as summarized below:    Chief Complaint  Patient presents with   Recurrent Skin Infections       Brief summary   Patient is a 53 year old female with diabetes type 2, IDDM, PE on Xarelto, hypothyroidism, hyperlipidemia, RA, asthma, depression, anxiety, chronic pain syndrome, OSA, morbid obesity who was admitted in May for left lower extremity cellulitis, placed on IV Rocephin while inpatient, then discharged on Keflex for 10-day course.  Patient reported that she finished the antibiotics, felt better and started noticing redness, painful and swelling of the left lower extremity.  Patient is on Xarelto at home.  She also reports pain in the right groin. In ED temp 102.4, tachycardia, WBC 13.0, creatinine 1.2   Assessment & Plan    Principal Problem: Sepsis secondary to left lower extremity cellulitis -Doppler ultrasound of the left lower extremity negative for DVT -Continue IV Ancef, slowly improving, still swollen and erythematous.   -CT tib-fib negative for any myositis/abscess or hematoma. - will continue antibiotics, Lasix 40 mg IV x1 -Patient does not want to take any narcotics.  She is on buprenorphine patch, was due to be changed on Saturday.   Active Problems: Right groin pain -Ultrasound abdomen showed complex mass in the right groin, lymphadenopathy versus abscess versus hernia or vascular lesion -CT abdomen pelvis negative for any mass or abscess, continue current antibiotics   History of Pulmonary embolism (HCC) Continue Xarelto, H&H stable    Diabetes mellitus  type 2 in obese (HCC) -Hemoglobin A1c 9.0 on 06/19/2018 -Patient reports taking Lantus 285 units daily, endocrinologist, Dr. Loanne Drilling -CBGs stable  History of asthma Currently stable, no wheezing  Depression, anxiety Continue Xanax, decrease dose during the day, 0.5 mg at 10 AM and 3 PM and 1 mg at bedtime.    OSA Continue CPAP  Morbid obesity BMI 62.6, recommended diet and weight control  Acute kidney injury -Likely due to sepsis, Toradol, DC NSAIDs -Creatinine had worsened to 1.6 on 6/18, creatinine resolved   Code Status: Full CODE STATUS DVT Prophylaxis: Xarelto Family Communication: Discussed in detail with the patient, all imaging results, lab results explained to the patient    Disposition Plan: Improving but not ready for discharge yet.  Probably another 24 hours of IV antibiotics, will follow ID recommendations   Time Spent in minutes 25 minutes Procedures:  None  Consultants:   Infectious disease  Antimicrobials:   Anti-infectives (From admission, onward)   Start     Dose/Rate Route Frequency Ordered Stop   07/12/18 0400  vancomycin (VANCOCIN) IVPB 1000 mg/200 mL premix  Status:  Discontinued     1,000 mg 200 mL/hr over 60 Minutes Intravenous Every  12 hours 07/11/18 1300 07/11/18 1418   07/11/18 1600  ceFAZolin (ANCEF) IVPB 2g/100 mL premix     2 g 200 mL/hr over 30 Minutes Intravenous Every 8 hours 07/11/18 1418     07/11/18 0900  vancomycin (VANCOCIN) 1,500 mg in sodium chloride 0.9 % 500 mL IVPB  Status:  Discontinued     1,500 mg 250 mL/hr over 120 Minutes Intravenous Every 24 hours 07/10/18 0829 07/11/18 1300   07/10/18 2000  cefTRIAXone (ROCEPHIN) 1 g in sodium chloride 0.9 % 100 mL IVPB  Status:  Discontinued     1 g 200 mL/hr over 30 Minutes Intravenous Every 24 hours 07/09/18 2210 07/09/18 2211   07/10/18 2000  cefTRIAXone (ROCEPHIN) 2 g in sodium chloride 0.9 % 100 mL IVPB  Status:  Discontinued     2 g 200 mL/hr over 30 Minutes Intravenous  Every 24 hours 07/09/18 2211 07/10/18 0746   07/10/18 0830  piperacillin-tazobactam (ZOSYN) IVPB 3.375 g  Status:  Discontinued     3.375 g 12.5 mL/hr over 240 Minutes Intravenous Every 8 hours 07/10/18 0823 07/11/18 1418   07/10/18 0830  vancomycin (VANCOCIN) 2,500 mg in sodium chloride 0.9 % 500 mL IVPB     2,500 mg 250 mL/hr over 120 Minutes Intravenous  Once 07/10/18 0829 07/10/18 1122   07/09/18 2215  cefTRIAXone (ROCEPHIN) 1 g in sodium chloride 0.9 % 100 mL IVPB     1 g 200 mL/hr over 30 Minutes Intravenous  Once 07/09/18 2211 07/10/18 0110   07/09/18 2000  cefTRIAXone (ROCEPHIN) 1 g in sodium chloride 0.9 % 100 mL IVPB     1 g 200 mL/hr over 30 Minutes Intravenous  Once 07/09/18 1956 07/09/18 2125         Medications  Scheduled Meds:  ALPRAZolam  0.5 mg Oral BID   ALPRAZolam  1 mg Oral QHS   Buprenorphine  1 patch Transdermal Q Sat   gabapentin  300 mg Oral BID   insulin aspart  0-15 Units Subcutaneous TID WC   insulin aspart  0-5 Units Subcutaneous QHS   insulin glargine  100 Units Subcutaneous QHS   And   insulin glargine  100 Units Subcutaneous QHS   And   insulin glargine  85 Units Subcutaneous QHS   levothyroxine  75 mcg Oral Daily   methocarbamol  750 mg Oral BID   rivaroxaban  20 mg Oral Q supper   sertraline  200 mg Oral Daily   Continuous Infusions:   ceFAZolin (ANCEF) IV 2 g (07/13/18 0829)   PRN Meds:.acetaminophen, albuterol      Subjective:   Cheyenne Guzman was seen and examined today.  Left leg cellulitis slowly improving, states right groin pain is also slowly improving.  Left leg still somewhat erythematous and tight.  No fevers. Patient denies dizziness, chest pain, shortness of breath, abdominal pain, N/V/D/C, new weakness, numbess, tingling.   Objective:   Vitals:   07/12/18 0817 07/12/18 2053 07/13/18 0345 07/13/18 0809  BP: 129/76 129/72 129/77 115/60  Pulse: 77 89 85 79  Resp: 18   18  Temp: 98.3 F (36.8 C) 98.8 F  (37.1 C) 97.6 F (36.4 C) 98.3 F (36.8 C)  TempSrc: Oral Oral Oral Oral  SpO2: 97% 94% 95% 96%  Weight:      Height:        Intake/Output Summary (Last 24 hours) at 07/13/2018 1444 Last data filed at 07/13/2018 0900 Gross per 24 hour  Intake 480 ml  Output 2 ml  Net 478 ml     Wt Readings from Last 3 Encounters:  07/09/18 (!) 181.4 kg  06/20/18 (!) 199.7 kg  03/13/18 (!) 191.1 kg   Physical Exam  General: Alert and oriented x 3, NAD  Eyes:   HEENT:  Atraumatic, normocephalic  Cardiovascular: S1 S2 clear, no murmurs, RRR.   Respiratory: CTAB, no wheezing, rales or rhonchi  Gastrointestinal: Soft, nontender, nondistended, NBS  Ext: Left lower extremity edema 1+  Neuro: no new deficits  Musculoskeletal: No cyanosis, clubbing  Skin: Redness in the left leg receding  Psych: Normal affect and demeanor, alert and oriented x3     On ADMISSION : BELOW       Data Reviewed:  I have personally reviewed following labs and imaging studies  Micro Results Recent Results (from the past 240 hour(s))  Novel Coronavirus,NAA,(SEND-OUT TO REF LAB - TAT 24-48 hrs); Hosp Order     Status: None   Collection Time: 07/09/18  8:19 PM   Specimen: Nasopharyngeal Swab; Respiratory  Result Value Ref Range Status   SARS-CoV-2, NAA NOT DETECTED NOT DETECTED Final    Comment: (NOTE) This test was developed and its performance characteristics determined by [employer]. This test has not been FDA cleared or approved. This test has been authorized by FDA under an Emergency Use Authorization (EUA). This test is only authorized for the duration of time the declaration that circumstances exist justifying the authorization of the emergency use of in vitro diagnostic tests for detection of SARS-CoV-2 virus and/or diagnosis of COVID-19 infection under section 564(b)(1) of the Act, 21 U.S.C. 696EXB-2(W)(4), unless the authorization is terminated or revoked sooner. When  diagnostic testing is negative, the possibility of a false negative result should be considered in the context of a patient's recent exposures and the presence of clinical signs and symptoms consistent with COVID-19. An individual without symptoms of COVID-19 and who is not shedding SARS-CoV-2 virus would expect to have a negative (not detected) result in this assay. Performed  At: [facility] [address] [place], [place] [id number] Rush Farmer MD VO:[id number]    [place]  Final    Comment: Performed at [facility], [place] [address], [place], [place] [address]  Culture, blood (routine x 2)     Status: None (Preliminary result)   Collection Time: 07/09/18 10:10 PM   Specimen: BLOOD  Result Value Ref Range Status   Specimen Description BLOOD RIGHT HAND  Final   Special Requests   Final    BOTTLES DRAWN AEROBIC AND ANAEROBIC Blood Culture results may not be optimal due to an inadequate volume of blood received in culture bottles   Culture   Final    NO GROWTH 4 DAYS Performed at [facility], [place] [address] [place], [place] [address]    Report Status PENDING  Incomplete  Culture, blood (routine x 2)     Status: None (Preliminary result)   Collection Time: 07/09/18 10:20 PM   Specimen: BLOOD  Result Value Ref Range Status   Specimen Description BLOOD LEFT HAND  Final   Special Requests   Final    BOTTLES DRAWN AEROBIC AND ANAEROBIC Blood Culture results may not be optimal due to an inadequate volume of blood received in culture bottles   Culture   Final    NO GROWTH 4 DAYS Performed at [facility], [place] [address], [place], [place] [address]    Report Status PENDING  Incomplete    Radiology Reports US Pelvis Limited (transabdominal Only)  Result Date: 07/11/2018 CLINICAL DATA:  Right groin pain.  History of breast cancer. EXAM: LIMITED ULTRASOUND OF PELVIS TECHNIQUE: Limited transabdominal ultrasound examination of the  pelvis was performed. COMPARISON:  CT 12/27/2014, 02/07/2012. FINDINGS: Complex mass is noted in the right groin. This was difficult to measure. This could represent lymphadenopathy. An abscess cannot be excluded. A hernia cannot be excluded. A vascular lesion is less likely. No prominent internal color flow noted. IV and oral contrast-enhanced CT of the abdomen and pelvis may prove useful for further evaluation. IMPRESSION: Prominent complex mass right groin for which further evaluation with IV and oral contrast-enhanced CT of the abdomen pelvis may prove useful for further evaluation. Electronically Signed   By: Marcello Moores  Register   On: 07/11/2018 16:12   Ct Abdomen Pelvis W Contrast  Result Date: 07/12/2018 CLINICAL DATA:  53 year old female with RIGHT groin pain and possible mass identified on recent ultrasound. EXAM: CT ABDOMEN AND PELVIS WITH CONTRAST TECHNIQUE: Multidetector CT imaging of the abdomen and pelvis was performed using the standard protocol following bolus administration of intravenous contrast. Delayed images through the UPPER thighs were obtained. CONTRAST:  169mL OMNIPAQUE IOHEXOL 300 MG/ML  SOLN COMPARISON:  07/11/2018 ultrasound and 12/27/2014 CT FINDINGS: Lower chest: No acute abnormality Hepatobiliary: The liver and gallbladder are unremarkable. No biliary dilatation. Pancreas: Unremarkable Spleen: Unremarkable Adrenals/Urinary Tract: The kidneys, adrenal glands and bladder are unremarkable. Stomach/Bowel: Stomach is within normal limits. Appendix appears normal. No evidence of bowel wall thickening, distention, or inflammatory changes. Vascular/Lymphatic: Aortic atherosclerosis. No enlarged abdominal or pelvic lymph nodes. Reproductive: Status post hysterectomy. No adnexal masses. Other: Moderate skin thickening and subcutaneous thickening/edema/inflammation is noted of the soft tissues of the MEDIAL UPPER RIGHT thigh. This is compatible with the findings identified sonographically and  likely represents inflammation or possibly infection. No discrete abscess or mass is noted. No ascites, pneumoperitoneum or abdominal wall hernia. Musculoskeletal: No acute or suspicious bony abnormalities. L4-L5 lumbar fusion changes noted. IMPRESSION: 1. Diffuse skin thickening and subcutaneous thickening/edema/inflammation within the MEDIAL UPPER RIGHT thigh, likely infectious or inflammatory, corresponding to the recent sonographic finding. No discrete mass or abscess. Clinical correlation/follow-up recommended. 2.  Aortic Atherosclerosis (ICD10-I70.0). Electronically Signed   By: Margarette Canada M.D.   On: 07/12/2018 18:18   Ct Tibia Fibula Left Wo Contrast  Result Date: 07/11/2018 CLINICAL DATA:  Redness, pain, and swelling of the left lower extremity. Recent cellulitis. Fever. Elevated white blood count. EXAM: CT OF THE LOWER LEFT EXTREMITY WITHOUT CONTRAST TECHNIQUE: Multidetector CT imaging of the left lower leg was performed according to the standard protocol. COMPARISON:  None. FINDINGS: Bones/Joint/Cartilage There are slight arthritic changes at the left knee and left ankle without appreciable joint effusions. No osteomyelitis or fracture. Muscles and Tendons Normal. Soft tissues There is extensive circumferential subcutaneous edema most prominent anteriorly. There is no definable abscess. There is thickening of the scan with a few benign calcifications in the soft tissues. Varicose veins are seen in the posteromedial aspect of the distal right thigh. IMPRESSION: No evidence of soft tissue abscess, myositis or osteomyelitis or joint effusions. Extensive subcutaneous edema in the lower leg, nonspecific. This could represent cellulitis or benign edema. Electronically Signed   By: Lorriane Shire M.D.   On: 07/11/2018 12:20   Dg Chest Portable 1 View  Result Date: 06/19/2018 CLINICAL DATA:  Fever and chills.  Leg swelling. EXAM: PORTABLE CHEST 1 VIEW COMPARISON:  CT chest dated  March 12, 2018. Chest  x-ray dated March 11, 2018. FINDINGS: Stable mild cardiomegaly. Normal mediastinal contours. Normal pulmonary vascularity. No focal consolidation, pleural effusion, or pneumothorax. No acute osseous abnormality. IMPRESSION: No active disease. Electronically Signed   By: Titus Dubin M.D.   On: 06/19/2018 15:42   Dg Hip Unilat With Pelvis 2-3 Views Right  Result Date: 07/09/2018 CLINICAL DATA:  Right groin pain for 2 days. Cellulitis in the left lower leg and sepsis. EXAM: DG HIP (WITH OR WITHOUT PELVIS) 2-3V RIGHT COMPARISON:  None. FINDINGS: There is no evidence of hip fracture or dislocation. Mild osteoarthrosis of the hip joints with acetabular osteophytosis. IMPRESSION: No acute fracture or dislocation identified. Electronically Signed   By: Kristine Garbe M.D.   On: 07/09/2018 22:53   Vas [place] Lower Extremity Venous (dvt)  Result Date: 07/11/2018  Lower Venous Study Indications: Pain, Edema, and Erythema. Other Indications: Cellulitus left lower extremity. Risk Factors: History of PE in February 2020. Limitations: Body habitus 399 lbs. Comparison Study: No comparison study Performing Technologist: Toma Copier RVS  Examination Guidelines: A complete evaluation includes B-mode imaging, spectral Doppler, color Doppler, and power Doppler as needed of all accessible portions of each vessel. Bilateral testing is considered an integral part of a complete examination. Limited examinations for reoccurring indications may be performed as noted.  +-----+---------------+---------+-----------+----------+-------+  RIGHT Compressibility Phasicity Spontaneity Properties Summary  +-----+---------------+---------+-----------+----------+-------+  CFV   Full            Yes       Yes                             +-----+---------------+---------+-----------+----------+-------+  SFJ   Full                                                      +-----+---------------+---------+-----------+----------+-------+    +---------+---------------+---------+-----------+----------+-------------------+  LEFT      Compressibility Phasicity Spontaneity Properties Summary              +---------+---------------+---------+-----------+----------+-------------------+  CFV       Full                                                                  +---------+---------------+---------+-----------+----------+-------------------+  SFJ       Full                                                                  +---------+---------------+---------+-----------+----------+-------------------+  FV Prox   Full                                                                  +---------+---------------+---------+-----------+----------+-------------------+  FV Mid    Full                                                                  +---------+---------------+---------+-----------+----------+-------------------+  FV Distal                                                  Not visualized       +---------+---------------+---------+-----------+----------+-------------------+  PFV                                                        Not visualized       +---------+---------------+---------+-----------+----------+-------------------+  POP       Full                                                                  +---------+---------------+---------+-----------+----------+-------------------+  PTV                                                        Not fully                                                                        visualized due to                                                                body habitus and                                                                 pain                 +---------+---------------+---------+-----------+----------+-------------------+   Left Technical Findings: Not visualized segments include multiple segments of the veins. Unable to visualize the peroneal or the distal femoral or profunda femoris.  Severely limited and inconclusive. Enlargement of the inguinal lymph nodes noted.   Summary: Right: There is no  obvious evidence of a common femoral vein obstruction. Left: There is no obvious evidence of deep vein thrombosis in the segments of the veins able to be imaged in the lower extremity. However, portions of this examination were limited- see technologist comments above.  *See table(s) above for measurements and observations. Electronically signed by Monica Martinez MD on 07/11/2018 at 4:50:35 PM.    Final    Vas [place] Lower Extremity Venous (dvt) (mc And Wl 7a-7p)  Result Date: 06/20/2018  Lower Venous Study Indications: Swelling, and Edema.  Limitations: Body habitus, poor ultrasound/tissue interface and patient positioning. Performing Technologist: Abram Sander RVS  Examination Guidelines: A complete evaluation includes B-mode imaging, spectral Doppler, color Doppler, and power Doppler as needed of all accessible portions of each vessel. Bilateral testing is considered an integral part of a complete examination. Limited examinations for reoccurring indications may be performed as noted.  +---------+---------------+---------+-----------+----------+--------------+  LEFT      Compressibility Phasicity Spontaneity Properties Summary         +---------+---------------+---------+-----------+----------+--------------+  CFV       Full                                                             +---------+---------------+---------+-----------+----------+--------------+  SFJ       Full                                                             +---------+---------------+---------+-----------+----------+--------------+  FV Prox   Full                                                             +---------+---------------+---------+-----------+----------+--------------+  FV Mid                                                     Not visualized   +---------+---------------+---------+-----------+----------+--------------+  FV Distal                                                  Not visualized  +---------+---------------+---------+-----------+----------+--------------+  PFV       Full                                                             +---------+---------------+---------+-----------+----------+--------------+  POP  Not visualized  +---------+---------------+---------+-----------+----------+--------------+  PTV                                                        Not visualized  +---------+---------------+---------+-----------+----------+--------------+  PERO                                                       Not visualized  +---------+---------------+---------+-----------+----------+--------------+     Summary: Left: There is no evidence of deep vein thrombosis in the lower extremity. However, portions of this examination were limited- see technologist comments above. No cystic structure found in the popliteal fossa.  *See table(s) above for measurements and observations. Electronically signed by Monica Martinez MD on 06/20/2018 at 5:24:03 PM.    Final     Lab Data:  CBC: Recent Labs  Lab 07/09/18 2030 07/10/18 0346 07/11/18 0350 07/12/18 0917 07/13/18 1020  WBC 13.0* 14.0* 10.5 7.0 6.8  NEUTROABS 11.5*  --   --   --   --   HGB 14.2 12.9 11.9* 11.2* 11.6*  HCT 47.3* 42.7 39.2 37.4 39.0  MCV 80.2 79.4* 79.7* 79.2* 79.4*  PLT PLATELET CLUMPS NOTED ON SMEAR, UNABLE TO ESTIMATE 213 200 198 629   Basic Metabolic Panel: Recent Labs  Lab 07/09/18 2030 07/10/18 0346 07/11/18 0350 07/12/18 0917 07/13/18 1020  NA 136 135 137 138 138  K 5.1 4.4 4.5 3.6 4.2  CL 102 101 103 103 103  CO2 21* 24 25 25 25   GLUCOSE 190* 326* 145* 137* 142*  BUN 14 20 19 15 13   CREATININE 1.21* 1.64* 1.17* 0.95 0.93  CALCIUM 9.2 8.5* 8.7* 8.6* 8.7*   GFR: Estimated Creatinine Clearance:  120.9 mL/min (by C-G formula based on SCr of 0.93 mg/dL). Liver Function Tests: No results for input(s): AST, ALT, ALKPHOS, BILITOT, PROT, ALBUMIN in the last 168 hours. No results for input(s): LIPASE, AMYLASE in the last 168 hours. No results for input(s): AMMONIA in the last 168 hours. Coagulation Profile: No results for input(s): INR, PROTIME in the last 168 hours. Cardiac Enzymes: No results for input(s): CKTOTAL, CKMB, CKMBINDEX, TROPONINI in the last 168 hours. BNP (last 3 results) No results for input(s): PROBNP in the last 8760 hours. HbA1C: No results for input(s): HGBA1C in the last 72 hours. CBG: Recent Labs  Lab 07/12/18 1226 07/12/18 1723 07/12/18 2053 07/13/18 0806 07/13/18 1132  GLUCAP 182* 195* 255* 87 128*   Lipid Profile: No results for input(s): CHOL, HDL, LDLCALC, TRIG, CHOLHDL, LDLDIRECT in the last 72 hours. Thyroid Function Tests: No results for input(s): TSH, T4TOTAL, FREET4, T3FREE, THYROIDAB in the last 72 hours. Anemia Panel: No results for input(s): VITAMINB12, FOLATE, FERRITIN, TIBC, IRON, RETICCTPCT in the last 72 hours. Urine analysis:    Component Value Date/Time   COLORURINE YELLOW 06/19/2018 1915   APPEARANCEUR HAZY (A) 06/19/2018 1915   APPEARANCEUR Clear 07/31/2013 1404   LABSPEC 1.020 06/19/2018 1915   PHURINE 7.0 06/19/2018 1915   GLUCOSEU NEGATIVE 06/19/2018 1915   HGBUR NEGATIVE 06/19/2018 1915   HGBUR negative 09/28/2009 [place] 06/19/2018 1915   BILIRUBINUR 1+ 10/31/2017 1721   BILIRUBINUR Negative 07/31/2013 1404  KETONESUR NEGATIVE 06/19/2018 1915   PROTEINUR NEGATIVE 06/19/2018 1915   UROBILINOGEN 0.2 10/31/2017 1721   UROBILINOGEN 0.2 07/03/2012 1027   NITRITE NEGATIVE 06/19/2018 1915   LEUKOCYTESUR NEGATIVE 06/19/2018 1915     Amaranta Mehl M.D. [facility] 07/13/2018, 2:44 PM  Pager: [phone] Between 7am to 7pm - call Pager - [phone]  After 7pm go to [email or web address] - password  [email or web address]  Call night coverage person covering after 7pm

## 2018-07-14 DIAGNOSIS — R1030 Lower abdominal pain, unspecified: Secondary | ICD-10-CM

## 2018-07-14 LAB — BASIC METABOLIC PANEL
Anion gap: 11 (ref 5–15)
BUN: 15 mg/dL (ref 6–20)
CO2: 26 mmol/L (ref 22–32)
Calcium: 9.3 mg/dL (ref 8.9–10.3)
Chloride: 101 mmol/L (ref 98–111)
Creatinine, Ser: 1.06 mg/dL — ABNORMAL HIGH (ref 0.44–1.00)
GFR calc Af Amer: >60 mL/min (ref >60)
GFR calc non Af Amer: 60 mL/min — ABNORMAL LOW (ref >60)
Glucose, Bld: 106 mg/dL — ABNORMAL HIGH (ref 70–99)
Potassium: 3.9 mmol/L (ref 3.5–5.1)
Sodium: 138 mmol/L (ref 135–145)

## 2018-07-14 LAB — GLUCOSE, CAPILLARY
Glucose-Capillary: 101 mg/dL — ABNORMAL HIGH (ref 70–99)
Glucose-Capillary: 171 mg/dL — ABNORMAL HIGH (ref 70–99)
Glucose-Capillary: 153 mg/dL — ABNORMAL HIGH (ref 70–99)
Glucose-Capillary: 253 mg/dL — ABNORMAL HIGH (ref 70–99)

## 2018-07-14 LAB — CULTURE, BLOOD (ROUTINE X 2)
Culture: NO GROWTH
Culture: NO GROWTH

## 2018-07-14 MED ORDER — ORITAVANCIN DIPHOSPHATE 400 MG IV SOLR
1200.0000 mg | Freq: Once | INTRAVENOUS | Status: AC
  Administered 2018-07-14: 1200 mg via INTRAVENOUS
  Filled 2018-07-14: qty 120

## 2018-07-14 MED ORDER — LINEZOLID 600 MG PO TABS: 600.0000 mg | ORAL_TABLET | Freq: Two times a day (BID) | ORAL | 0 refills | Status: DC

## 2018-07-14 NOTE — Progress Notes (Signed)
[facility]                                                                              Patient Demographics  Cheyenne Guzman, is a 53 y.o. female, DOB - 25-Dec-1965, WUX:[id number]  Admit date - 07/09/2018   Admitting Physician Shela Leff, MD  Outpatient Primary MD for the patient is Laurey Morale, MD  Outpatient specialists:   LOS - 4  days   Medical records reviewed and are as summarized below:    Chief Complaint  Patient presents with   Recurrent Skin Infections       Brief summary   Patient is a 53 year old female with diabetes type 2, IDDM, PE on Xarelto, hypothyroidism, hyperlipidemia, RA, asthma, depression, anxiety, chronic pain syndrome, OSA, morbid obesity who was admitted in May for left lower extremity cellulitis, placed on IV Rocephin while inpatient, then discharged on Keflex for 10-day course.  Patient reported that she finished the antibiotics, felt better and started noticing redness, painful and swelling of the left lower extremity.  Patient is on Xarelto at home.  She also reports pain in the right groin. In ED temp 102.4, tachycardia, WBC 13.0, creatinine 1.2   Assessment & Plan    Principal Problem: Sepsis secondary to left lower extremity cellulitis -Doppler ultrasound of the left lower extremity negative for DVT -CT tib-fib negative for any myositis/abscess or hematoma. -Continue IV Ancef, will give 1 dose of Lasix 40 mg IV x1, continue to elevate leg -Mild improvement in the left lower extremity cellulitis.  Will await ID recommendations regarding transitioning to oral antibiotics and disposition. -Also has significant lymphedema in the left lower extremity, recommended to follow-up at [facility] in the lymphedema clinic  Active Problems: Right groin pain -Ultrasound abdomen showed complex mass in the right groin, lymphadenopathy versus abscess versus hernia or vascular lesion -CT abdomen pelvis negative for any mass or abscess,  continue current antibiotics -Right groin pain improving   History of Pulmonary embolism (HCC) Continue Xarelto, H&H stable    Diabetes mellitus type 2 in obese (HCC) -Hemoglobin A1c 9.0 on 06/19/2018 -Patient reports taking Lantus 285 units daily, endocrinologist, Dr. Loanne Drilling -CBGs stable  History of asthma Currently stable, no wheezing  Depression, anxiety Continue Xanax at current dosing   OSA Continue CPAP  Morbid obesity BMI 62.6, recommended diet and weight control  Acute kidney injury -Likely due to sepsis, Toradol, DC NSAIDs -Creatinine had worsened to 1.6 on 6/18, creatinine resolved   Code Status: Full CODE STATUS DVT Prophylaxis: Xarelto Family Communication: Discussed in detail with the patient, all imaging results, lab results explained to the patient    Disposition Plan: Awaiting ID recommendations regarding antibiotics, plan to transition to oral antibiotics and disposition   Time Spent in minutes 25 minutes Procedures:  None  Consultants:   Infectious disease  Antimicrobials:   Anti-infectives (From admission, onward)   Start     Dose/Rate Route Frequency Ordered Stop   07/12/18 0400  vancomycin (VANCOCIN) IVPB 1000 mg/200 mL premix  Status:  Discontinued     1,000 mg 200 mL/hr over 60 Minutes Intravenous Every 12 hours 07/11/18 1300 07/11/18  1418   07/11/18 1600  ceFAZolin (ANCEF) IVPB 2g/100 mL premix     2 g 200 mL/hr over 30 Minutes Intravenous Every 8 hours 07/11/18 1418     07/11/18 0900  vancomycin (VANCOCIN) 1,500 mg in sodium chloride 0.9 % 500 mL IVPB  Status:  Discontinued     1,500 mg 250 mL/hr over 120 Minutes Intravenous Every 24 hours 07/10/18 0829 07/11/18 1300   07/10/18 2000  cefTRIAXone (ROCEPHIN) 1 g in sodium chloride 0.9 % 100 mL IVPB  Status:  Discontinued     1 g 200 mL/hr over 30 Minutes Intravenous Every 24 hours 07/09/18 2210 07/09/18 2211   07/10/18 2000  cefTRIAXone (ROCEPHIN) 2 g in sodium chloride 0.9 % 100 mL  IVPB  Status:  Discontinued     2 g 200 mL/hr over 30 Minutes Intravenous Every 24 hours 07/09/18 2211 07/10/18 0746   07/10/18 0830  piperacillin-tazobactam (ZOSYN) IVPB 3.375 g  Status:  Discontinued     3.375 g 12.5 mL/hr over 240 Minutes Intravenous Every 8 hours 07/10/18 0823 07/11/18 1418   07/10/18 0830  vancomycin (VANCOCIN) 2,500 mg in sodium chloride 0.9 % 500 mL IVPB     2,500 mg 250 mL/hr over 120 Minutes Intravenous  Once 07/10/18 0829 07/10/18 1122   07/09/18 2215  cefTRIAXone (ROCEPHIN) 1 g in sodium chloride 0.9 % 100 mL IVPB     1 g 200 mL/hr over 30 Minutes Intravenous  Once 07/09/18 2211 07/10/18 0110   07/09/18 2000  cefTRIAXone (ROCEPHIN) 1 g in sodium chloride 0.9 % 100 mL IVPB     1 g 200 mL/hr over 30 Minutes Intravenous  Once 07/09/18 1956 07/09/18 2125         Medications  Scheduled Meds:  ALPRAZolam  0.5 mg Oral BID   ALPRAZolam  1 mg Oral QHS   Buprenorphine  1 patch Transdermal Q Sat   gabapentin  300 mg Oral BID   insulin aspart  0-15 Units Subcutaneous TID WC   insulin aspart  0-5 Units Subcutaneous QHS   insulin glargine  100 Units Subcutaneous QHS   And   insulin glargine  100 Units Subcutaneous QHS   And   insulin glargine  85 Units Subcutaneous QHS   levothyroxine  75 mcg Oral Daily   methocarbamol  750 mg Oral BID   rivaroxaban  20 mg Oral Q supper   sertraline  200 mg Oral Daily   Continuous Infusions:   ceFAZolin (ANCEF) IV 2 g (07/14/18 0900)   PRN Meds:.acetaminophen, albuterol      Subjective:   Cheyenne Guzman was seen and examined today.  Somewhat frustrated regarding the swelling in her left lower extremity.  Much less erythematous but still swollen.  No fevers.  States did not rest well due to burning sensation in her feet and legs last night.  Patient denies dizziness, chest pain, shortness of breath, abdominal pain, N/V/D/C  Objective:   Vitals:   07/13/18 1550 07/13/18 2031 07/14/18 0511 07/14/18 0740    BP: 131/64 101/60 135/63 134/72  Pulse: 88 91 87 79  Resp: 18   18  Temp: 99 F (37.2 C) 98.3 F (36.8 C) 99.2 F (37.3 C) 98.1 F (36.7 C)  TempSrc: Oral Oral Oral Oral  SpO2: 96% 98% 94% 96%  Weight:      Height:        Intake/Output Summary (Last 24 hours) at 07/14/2018 1153 Last data filed at 07/14/2018 1030 Gross per 24 hour  Intake 480 ml  Output --  Net 480 ml     Wt Readings from Last 3 Encounters:  07/09/18 (!) 181.4 kg  06/20/18 (!) 199.7 kg  03/13/18 (!) 191.1 kg   Physical Exam  General: Alert and oriented x 3, NAD  Eyes:   HEENT:  Atraumatic, normocephalic  Cardiovascular: S1 S2 clear, no murmurs, RRR.   Respiratory: CTAB, no wheezing, rales or rhonchi  Gastrointestinal: Soft, nontender, nondistended, NBS  Ext: LLE edema 1+  Neuro: no new deficits  Musculoskeletal: No cyanosis, clubbing  Skin: Left lower extremity cellulitis, improving  Psych: Normal affect and demeanor, alert and oriented x3    On ADMISSION : BELOW       Data Reviewed:  I have personally reviewed following labs and imaging studies  Micro Results Recent Results (from the past 240 hour(s))  Novel Coronavirus,NAA,(SEND-OUT TO REF LAB - TAT 24-48 hrs); Hosp Order     Status: None   Collection Time: 07/09/18  8:19 PM   Specimen: Nasopharyngeal Swab; Respiratory  Result Value Ref Range Status   SARS-CoV-2, NAA NOT DETECTED NOT DETECTED Final    Comment: (NOTE) This test was developed and its performance characteristics determined by [employer]. This test has not been FDA cleared or approved. This test has been authorized by FDA under an Emergency Use Authorization (EUA). This test is only authorized for the duration of time the declaration that circumstances exist justifying the authorization of the emergency use of in vitro diagnostic tests for detection of SARS-CoV-2 virus and/or diagnosis of COVID-19 infection under section 564(b)(1) of the Act, 21  U.S.C. 950DTO-6(Z)(1), unless the authorization is terminated or revoked sooner. When diagnostic testing is negative, the possibility of a false negative result should be considered in the context of a patient's recent exposures and the presence of clinical signs and symptoms consistent with COVID-19. An individual without symptoms of COVID-19 and who is not shedding SARS-CoV-2 virus would expect to have a negative (not detected) result in this assay. Performed  At: [facility] [address] [place], [place] [id number] Rush Farmer MD JA:[id number]    [place]  Final    Comment: Performed at [facility], [place] [address], [place], [place] [address]  Culture, blood (routine x 2)     Status: None   Collection Time: 07/09/18 10:10 PM   Specimen: BLOOD  Result Value Ref Range Status   Specimen Description BLOOD RIGHT HAND  Final   Special Requests   Final    BOTTLES DRAWN AEROBIC AND ANAEROBIC Blood Culture results may not be optimal due to an inadequate volume of blood received in culture bottles   Culture   Final    NO GROWTH 5 DAYS Performed at [facility], [place] [address], [place], [place] [address]    Report Status 07/14/2018 FINAL  Final  Culture, blood (routine x 2)     Status: None   Collection Time: 07/09/18 10:20 PM   Specimen: BLOOD  Result Value Ref Range Status   Specimen Description BLOOD LEFT HAND  Final   Special Requests   Final    BOTTLES DRAWN AEROBIC AND ANAEROBIC Blood Culture results may not be optimal due to an inadequate volume of blood received in culture bottles   Culture   Final    NO GROWTH 5 DAYS Performed at [facility], [place] [address], [place], [place] [address]    Report Status 07/14/2018 FINAL  Final  Radiology Reports US Pelvis Limited (transabdominal Only)  Result Date: 07/11/2018 CLINICAL DATA:  Right groin pain.  History of breast cancer. EXAM: LIMITED ULTRASOUND OF PELVIS TECHNIQUE:  Limited transabdominal ultrasound examination of the pelvis was performed. COMPARISON:  CT 12/27/2014, 02/07/2012. FINDINGS: Complex mass is noted in the right groin. This was difficult to measure. This could represent lymphadenopathy. An abscess cannot be excluded. A hernia cannot be excluded. A vascular lesion is less likely. No prominent internal color flow noted. IV and oral contrast-enhanced CT of the abdomen and pelvis may prove useful for further evaluation. IMPRESSION: Prominent complex mass right groin for which further evaluation with IV and oral contrast-enhanced CT of the abdomen pelvis may prove useful for further evaluation. Electronically Signed   By: Marcello Moores  Register   On: 07/11/2018 16:12   Ct Abdomen Pelvis W Contrast  Result Date: 07/12/2018 CLINICAL DATA:  53 year old female with RIGHT groin pain and possible mass identified on recent ultrasound. EXAM: CT ABDOMEN AND PELVIS WITH CONTRAST TECHNIQUE: Multidetector CT imaging of the abdomen and pelvis was performed using the standard protocol following bolus administration of intravenous contrast. Delayed images through the UPPER thighs were obtained. CONTRAST:  15mL OMNIPAQUE IOHEXOL 300 MG/ML  SOLN COMPARISON:  07/11/2018 ultrasound and 12/27/2014 CT FINDINGS: Lower chest: No acute abnormality Hepatobiliary: The liver and gallbladder are unremarkable. No biliary dilatation. Pancreas: Unremarkable Spleen: Unremarkable Adrenals/Urinary Tract: The kidneys, adrenal glands and bladder are unremarkable. Stomach/Bowel: Stomach is within normal limits. Appendix appears normal. No evidence of bowel wall thickening, distention, or inflammatory changes. Vascular/Lymphatic: Aortic atherosclerosis. No enlarged abdominal or pelvic lymph nodes. Reproductive: Status post hysterectomy. No adnexal masses. Other: Moderate skin thickening and subcutaneous thickening/edema/inflammation is noted of the soft tissues of the MEDIAL UPPER RIGHT thigh. This is  compatible with the findings identified sonographically and likely represents inflammation or possibly infection. No discrete abscess or mass is noted. No ascites, pneumoperitoneum or abdominal wall hernia. Musculoskeletal: No acute or suspicious bony abnormalities. L4-L5 lumbar fusion changes noted. IMPRESSION: 1. Diffuse skin thickening and subcutaneous thickening/edema/inflammation within the MEDIAL UPPER RIGHT thigh, likely infectious or inflammatory, corresponding to the recent sonographic finding. No discrete mass or abscess. Clinical correlation/follow-up recommended. 2.  Aortic Atherosclerosis (ICD10-I70.0). Electronically Signed   By: Margarette Canada M.D.   On: 07/12/2018 18:18   Ct Tibia Fibula Left Wo Contrast  Result Date: 07/11/2018 CLINICAL DATA:  Redness, pain, and swelling of the left lower extremity. Recent cellulitis. Fever. Elevated white blood count. EXAM: CT OF THE LOWER LEFT EXTREMITY WITHOUT CONTRAST TECHNIQUE: Multidetector CT imaging of the left lower leg was performed according to the standard protocol. COMPARISON:  None. FINDINGS: Bones/Joint/Cartilage There are slight arthritic changes at the left knee and left ankle without appreciable joint effusions. No osteomyelitis or fracture. Muscles and Tendons Normal. Soft tissues There is extensive circumferential subcutaneous edema most prominent anteriorly. There is no definable abscess. There is thickening of the scan with a few benign calcifications in the soft tissues. Varicose veins are seen in the posteromedial aspect of the distal right thigh. IMPRESSION: No evidence of soft tissue abscess, myositis or osteomyelitis or joint effusions. Extensive subcutaneous edema in the lower leg, nonspecific. This could represent cellulitis or benign edema. Electronically Signed   By: Lorriane Shire M.D.   On: 07/11/2018 12:20   Dg Chest Portable 1 View  Result Date: 06/19/2018 CLINICAL DATA:  Fever and chills.  Leg swelling. EXAM: PORTABLE CHEST 1  VIEW COMPARISON:  CT chest dated March 12, 2018.  Chest x-ray dated March 11, 2018. FINDINGS: Stable mild cardiomegaly. Normal mediastinal contours. Normal pulmonary vascularity. No focal consolidation, pleural effusion, or pneumothorax. No acute osseous abnormality. IMPRESSION: No active disease. Electronically Signed   By: Titus Dubin M.D.   On: 06/19/2018 15:42   Dg Hip Unilat With Pelvis 2-3 Views Right  Result Date: 07/09/2018 CLINICAL DATA:  Right groin pain for 2 days. Cellulitis in the left lower leg and sepsis. EXAM: DG HIP (WITH OR WITHOUT PELVIS) 2-3V RIGHT COMPARISON:  None. FINDINGS: There is no evidence of hip fracture or dislocation. Mild osteoarthrosis of the hip joints with acetabular osteophytosis. IMPRESSION: No acute fracture or dislocation identified. Electronically Signed   By: Kristine Garbe M.D.   On: 07/09/2018 22:53   Vas [place] Lower Extremity Venous (dvt)  Result Date: 07/11/2018  Lower Venous Study Indications: Pain, Edema, and Erythema. Other Indications: Cellulitus left lower extremity. Risk Factors: History of PE in February 2020. Limitations: Body habitus 399 lbs. Comparison Study: No comparison study Performing Technologist: Toma Copier RVS  Examination Guidelines: A complete evaluation includes B-mode imaging, spectral Doppler, color Doppler, and power Doppler as needed of all accessible portions of each vessel. Bilateral testing is considered an integral part of a complete examination. Limited examinations for reoccurring indications may be performed as noted.  +-----+---------------+---------+-----------+----------+-------+  RIGHT Compressibility Phasicity Spontaneity Properties Summary  +-----+---------------+---------+-----------+----------+-------+  CFV   Full            Yes       Yes                             +-----+---------------+---------+-----------+----------+-------+  SFJ   Full                                                       +-----+---------------+---------+-----------+----------+-------+   +---------+---------------+---------+-----------+----------+-------------------+  LEFT      Compressibility Phasicity Spontaneity Properties Summary              +---------+---------------+---------+-----------+----------+-------------------+  CFV       Full                                                                  +---------+---------------+---------+-----------+----------+-------------------+  SFJ       Full                                                                  +---------+---------------+---------+-----------+----------+-------------------+  FV Prox   Full                                                                  +---------+---------------+---------+-----------+----------+-------------------+  FV Mid    Full                                                                  +---------+---------------+---------+-----------+----------+-------------------+  FV Distal                                                  Not visualized       +---------+---------------+---------+-----------+----------+-------------------+  PFV                                                        Not visualized       +---------+---------------+---------+-----------+----------+-------------------+  POP       Full                                                                  +---------+---------------+---------+-----------+----------+-------------------+  PTV                                                        Not fully                                                                        visualized due to                                                                body habitus and                                                                 pain                 +---------+---------------+---------+-----------+----------+-------------------+   Left Technical Findings: Not visualized segments include multiple segments of the veins. Unable to  visualize the peroneal or the distal femoral or profunda femoris. Severely limited and inconclusive. Enlargement of the inguinal lymph nodes noted.   Summary: Right: There is no  obvious evidence of a common femoral vein obstruction. Left: There is no obvious evidence of deep vein thrombosis in the segments of the veins able to be imaged in the lower extremity. However, portions of this examination were limited- see technologist comments above.  *See table(s) above for measurements and observations. Electronically signed by Monica Martinez MD on 07/11/2018 at 4:50:35 PM.    Final    Vas [place] Lower Extremity Venous (dvt) (mc And Wl 7a-7p)  Result Date: 06/20/2018  Lower Venous Study Indications: Swelling, and Edema.  Limitations: Body habitus, poor ultrasound/tissue interface and patient positioning. Performing Technologist: Abram Sander RVS  Examination Guidelines: A complete evaluation includes B-mode imaging, spectral Doppler, color Doppler, and power Doppler as needed of all accessible portions of each vessel. Bilateral testing is considered an integral part of a complete examination. Limited examinations for reoccurring indications may be performed as noted.  +---------+---------------+---------+-----------+----------+--------------+  LEFT      Compressibility Phasicity Spontaneity Properties Summary         +---------+---------------+---------+-----------+----------+--------------+  CFV       Full                                                             +---------+---------------+---------+-----------+----------+--------------+  SFJ       Full                                                             +---------+---------------+---------+-----------+----------+--------------+  FV Prox   Full                                                             +---------+---------------+---------+-----------+----------+--------------+  FV Mid                                                     Not visualized   +---------+---------------+---------+-----------+----------+--------------+  FV Distal                                                  Not visualized  +---------+---------------+---------+-----------+----------+--------------+  PFV       Full                                                             +---------+---------------+---------+-----------+----------+--------------+  POP  Not visualized  +---------+---------------+---------+-----------+----------+--------------+  PTV                                                        Not visualized  +---------+---------------+---------+-----------+----------+--------------+  PERO                                                       Not visualized  +---------+---------------+---------+-----------+----------+--------------+     Summary: Left: There is no evidence of deep vein thrombosis in the lower extremity. However, portions of this examination were limited- see technologist comments above. No cystic structure found in the popliteal fossa.  *See table(s) above for measurements and observations. Electronically signed by Monica Martinez MD on 06/20/2018 at 5:24:03 PM.    Final     Lab Data:  CBC: Recent Labs  Lab 07/09/18 2030 07/10/18 0346 07/11/18 0350 07/12/18 0917 07/13/18 1020  WBC 13.0* 14.0* 10.5 7.0 6.8  NEUTROABS 11.5*  --   --   --   --   HGB 14.2 12.9 11.9* 11.2* 11.6*  HCT 47.3* 42.7 39.2 37.4 39.0  MCV 80.2 79.4* 79.7* 79.2* 79.4*  PLT PLATELET CLUMPS NOTED ON SMEAR, UNABLE TO ESTIMATE 213 200 198 650   Basic Metabolic Panel: Recent Labs  Lab 07/10/18 0346 07/11/18 0350 07/12/18 0917 07/13/18 1020 07/14/18 0422  NA 135 137 138 138 138  K 4.4 4.5 3.6 4.2 3.9  CL 101 103 103 103 101  CO2 24 25 25 25 26   GLUCOSE 326* 145* 137* 142* 106*  BUN 20 19 15 13 15   CREATININE 1.64* 1.17* 0.95 0.93 1.06*  CALCIUM 8.5* 8.7* 8.6* 8.7* 9.3   GFR: Estimated Creatinine Clearance:  106.1 mL/min (A) (by C-G formula based on SCr of 1.06 mg/dL (H)). Liver Function Tests: No results for input(s): AST, ALT, ALKPHOS, BILITOT, PROT, ALBUMIN in the last 168 hours. No results for input(s): LIPASE, AMYLASE in the last 168 hours. No results for input(s): AMMONIA in the last 168 hours. Coagulation Profile: No results for input(s): INR, PROTIME in the last 168 hours. Cardiac Enzymes: No results for input(s): CKTOTAL, CKMB, CKMBINDEX, TROPONINI in the last 168 hours. BNP (last 3 results) No results for input(s): PROBNP in the last 8760 hours. HbA1C: No results for input(s): HGBA1C in the last 72 hours. CBG: Recent Labs  Lab 07/13/18 1132 07/13/18 1708 07/13/18 2109 07/14/18 0742 07/14/18 1138  GLUCAP 128* 162* 205* 101* 171*   Lipid Profile: No results for input(s): CHOL, HDL, LDLCALC, TRIG, CHOLHDL, LDLDIRECT in the last 72 hours. Thyroid Function Tests: No results for input(s): TSH, T4TOTAL, FREET4, T3FREE, THYROIDAB in the last 72 hours. Anemia Panel: No results for input(s): VITAMINB12, FOLATE, FERRITIN, TIBC, IRON, RETICCTPCT in the last 72 hours. Urine analysis:    Component Value Date/Time   COLORURINE YELLOW 06/19/2018 1915   APPEARANCEUR HAZY (A) 06/19/2018 1915   APPEARANCEUR Clear 07/31/2013 1404   LABSPEC 1.020 06/19/2018 1915   PHURINE 7.0 06/19/2018 1915   GLUCOSEU NEGATIVE 06/19/2018 [place] 06/19/2018 1915   HGBUR negative 09/28/2009 [place] 06/19/2018 1915   BILIRUBINUR 1+ 10/31/2017 1721   BILIRUBINUR Negative 07/31/2013  [place] 06/19/2018 [place] 06/19/2018 1915   UROBILINOGEN 0.2 10/31/2017 1721   UROBILINOGEN 0.2 07/03/2012 1027   NITRITE NEGATIVE 06/19/2018 1915   LEUKOCYTESUR NEGATIVE 06/19/2018 1915     Lovelyn Sheeran M.D. [facility] 07/14/2018, 11:53 AM  Pager: [phone] Between 7am to 7pm - call Pager - [phone]  After 7pm go to [email or web address] -  password [email or web address]  Call night coverage person covering after 7pm

## 2018-07-14 NOTE — Plan of Care (Signed)
  Problem: Education: Goal: Knowledge of General Education information will improve Description: Including pain rating scale, medication(s)/side effects and non-pharmacologic comfort measures Outcome: Progressing   Problem: Health Behavior/Discharge Planning: Goal: Ability to manage health-related needs will improve Outcome: Progressing   Problem: Clinical Measurements: Goal: Ability to maintain clinical measurements within normal limits will improve Outcome: Progressing Goal: Will remain free from infection Outcome: Progressing Goal: Diagnostic test results will improve Outcome: Progressing Goal: Respiratory complications will improve Outcome: Progressing   Problem: Nutrition: Goal: Adequate nutrition will be maintained Outcome: Progressing   Problem: Elimination: Goal: Will not experience complications related to bowel motility Outcome: Progressing Goal: Will not experience complications related to urinary retention Outcome: Progressing   Problem: Skin Integrity: Goal: Risk for impaired skin integrity will decrease Outcome: Progressing

## 2018-07-14 NOTE — Discharge Instructions (Signed)
Continue to check blood sugars 4 times daily- Fasting, 2 hours post prandial and at bedtime. Write these values down and communicate with MD if >180-200 mg/dL.  We discussed absorption of large doses, U-500 and insulin dosing per meals as these items would be relevant to next endo appointment along with blood sugar log.   The endo list as requested: Local Endocrinologists [facility] [phone]) 1. Dr. Philemon Kingdom 2. Dr. Elayne Snare ******[facility] [phone]) 1. Dr. Delrae Rend [facility] [phone]) 1. Dr. Jacelyn Pi 2. Dr. Anda Kraft ***[facility] [phone]) 1. Dr. Reynold Bowen [facility] [phone]) [[place] office]  [phone]) [[place] office] 1. Dr. Lenna Sciara [place] 2. Dr. Judithann Sheen [facility]) [phone]) 1. Autumn Hudnall Ronnald Ramp), PA 2. Dr. Amalia Greenhouse 3. Dr. Marsh Dolly. [facility] [phone]) 1. Dr. Glade Lloyd [facility] [phone]) 1. Dr. Orville Govern 2. Dr. Lelon Huh 3. Dr. Jerelene Redden 4. Alwyn Ren, FNP Dr. Carolynn Serve. [facility] in [place] [phone])    For Medication Assistance for Insulin: Look up Samuella Bruin at the following website: [email or web address]://www.lillycares.com/_Assets/pdf/LillyCares_Application_0409.pdf  Bring this application to your physician to get Nancee Liter( a cousin of Lantus) and Trulicity (a drug that helps with diabetes and weight loss).

## 2018-07-14 NOTE — Progress Notes (Signed)
Notified TRH1 on cal Dr of patient's request for medicine for potential yeast infection around the rectum (her words). She stated it burned when she cleaned after having a BM today. Will continue to monitor patient.

## 2018-07-14 NOTE — Care Management Important Message (Signed)
Important Message  Patient Details  Name: Cheyenne Guzman MRN: [id number] Date of Birth: [birth year]   Medicare Important Message Given:  Yes     Memory Argue 07/14/2018, 3:53 PM

## 2018-07-14 NOTE — Progress Notes (Signed)
[facility]  Date of Admission:  07/09/2018     Total days of antibiotics 6         ASSESSMENT/PLAN  Cheyenne Guzman is a 53 y/o female with recurrent cellulitis of the left lower extremity with imaging without abscess or bone involvement. Blood cultures have remained without growth. She has had improvements since starting her current Ancef and has remained afebrile and hemodynamically stable.  Cellulitis - Day 6 of Ancef. Will discontinue Ancef for 1 dose of Oritavancin for cellulitis. Discussed importance of decreasing fluid in her legs and working on improving chronic conditions to prevent infection in the future.  Type 2 diabetes - Most recent A1c of 9. Discussed importance of controlling blood sugars to reduce risk of infection in the future. Working on finding a new endocrinologist.  Obesity - BMI of 62. Recommend weight loss as able which is challenged by her higher doses of insulin. Recommend continuing to work with primary care and possible obesity medicine specialist to facilitate weight loss.      Principal Problem:   Cellulitis Active Problems:   Pulmonary embolism (HCC)   Diabetes mellitus type 2 in obese (HCC)   Sepsis (HCC)   Thigh pain   . ALPRAZolam  0.5 mg Oral BID  . ALPRAZolam  1 mg Oral QHS  . Buprenorphine  1 patch Transdermal Q Sat  . gabapentin  300 mg Oral BID  . insulin aspart  0-15 Units Subcutaneous TID WC  . insulin aspart  0-5 Units Subcutaneous QHS  . insulin glargine  100 Units Subcutaneous QHS   And  . insulin glargine  100 Units Subcutaneous QHS   And  . insulin glargine  85 Units Subcutaneous QHS  . levothyroxine  75 mcg Oral Daily  . methocarbamol  750 mg Oral BID  . rivaroxaban  20 mg Oral Q supper  . sertraline  200 mg Oral Daily    SUBJECTIVE:  Afebrile and hemodynamically stable with no acute events overnight. Continues to have pain in her left lower extremity described as burning at times. Redness  improved. Denies feelings of fever, chills, or sweats.   Allergies  Allergen Reactions  . Vicodin [Hydrocodone-Acetaminophen] Nausea And Vomiting     Review of Systems: Review of Systems  Constitutional: Negative for chills, fever and weight loss.  Respiratory: Negative for cough, shortness of breath and wheezing.   Cardiovascular: Positive for leg swelling (Burning leg pain ). Negative for chest pain.  Gastrointestinal: Negative for abdominal pain, constipation, diarrhea, nausea and vomiting.  Skin: Negative for rash.      OBJECTIVE: Vitals:   07/13/18 1550 07/13/18 2031 07/14/18 0511 07/14/18 0740  BP: 131/64 101/60 135/63 134/72  Pulse: 88 91 87 79  Resp: 18   18  Temp: 99 F (37.2 C) 98.3 F (36.8 C) 99.2 F (37.3 C) 98.1 F (36.7 C)  TempSrc: Oral Oral Oral Oral  SpO2: 96% 98% 94% 96%  Weight:      Height:       Body mass index is 62.65 kg/m.  Physical Exam Constitutional:      General: She is not in acute distress.    Appearance: She is well-developed. She is obese.     Comments: Seated in the chair; pleasant.   Cardiovascular:     Rate and Rhythm: Normal rate and regular rhythm.     Heart sounds: Normal heart sounds.  Pulmonary:     Effort: Pulmonary effort is normal.  Breath sounds: Normal breath sounds.  Skin:    General: Skin is warm and dry.     Comments: Mild redness of left lower extremity appears to be resolving compared to initial images reviewed. No skin lesions or breakdown present.   Neurological:     Mental Status: She is alert and oriented to person, place, and time.  Psychiatric:        Behavior: Behavior normal.        Thought Content: Thought content normal.        Judgment: Judgment normal.     Lab Results Lab Results  Component Value Date   WBC 6.8 07/13/2018   HGB 11.6 (L) 07/13/2018   HCT 39.0 07/13/2018   MCV 79.4 (L) 07/13/2018   PLT 212 07/13/2018    Lab Results  Component Value Date   CREATININE 1.06 (H)  07/14/2018   BUN 15 07/14/2018   NA 138 07/14/2018   K 3.9 07/14/2018   CL 101 07/14/2018   CO2 26 07/14/2018    Lab Results  Component Value Date   ALT 16 06/19/2018   AST 21 06/19/2018   ALKPHOS 90 06/19/2018   BILITOT 0.7 06/19/2018     Microbiology: Recent Results (from the past 240 hour(s))  Novel Coronavirus,NAA,(SEND-OUT TO REF LAB - TAT 24-48 hrs); Hosp Order     Status: None   Collection Time: 07/09/18  8:19 PM   Specimen: Nasopharyngeal Swab; Respiratory  Result Value Ref Range Status   SARS-CoV-2, NAA NOT DETECTED NOT DETECTED Final    Comment: (NOTE) This test was developed and its performance characteristics determined by [employer]. This test has not been FDA cleared or approved. This test has been authorized by FDA under an Emergency Use Authorization (EUA). This test is only authorized for the duration of time the declaration that circumstances exist justifying the authorization of the emergency use of in vitro diagnostic tests for detection of SARS-CoV-2 virus and/or diagnosis of COVID-19 infection under section 564(b)(1) of the Act, 21 U.S.C. 093ATF-5(D)(3), unless the authorization is terminated or revoked sooner. When diagnostic testing is negative, the possibility of a false negative result should be considered in the context of a patient's recent exposures and the presence of clinical signs and symptoms consistent with COVID-19. An individual without symptoms of COVID-19 and who is not shedding SARS-CoV-2 virus would expect to have a negative (not detected) result in this assay. Performed  At: [facility] [address] [place], [place] [id number] Rush Farmer MD WC:[id number]    [place]  Final    Comment: Performed at [facility], [place] [address], [place], [place] [address]  Culture, blood (routine x 2)     Status: None   Collection Time: 07/09/18 10:10 PM   Specimen: BLOOD  Result Value Ref  Range Status   Specimen Description BLOOD RIGHT HAND  Final   Special Requests   Final    BOTTLES DRAWN AEROBIC AND ANAEROBIC Blood Culture results may not be optimal due to an inadequate volume of blood received in culture bottles   Culture   Final    NO GROWTH 5 DAYS Performed at [facility], [place] [address], [place], [place] [address]    Report Status 07/14/2018 FINAL  Final  Culture, blood (routine x 2)     Status: None   Collection Time: 07/09/18 10:20 PM   Specimen: BLOOD  Result Value Ref Range Status   Specimen Description BLOOD LEFT HAND  Final   Special Requests   Final    BOTTLES DRAWN AEROBIC AND ANAEROBIC Blood Culture results may not be optimal due to an inadequate volume of blood received in culture bottles   Culture   Final    NO GROWTH 5 DAYS Performed at [facility], [place] [address] [place], [place] [address]    Report Status 07/14/2018 FINAL  Final     [name], NP [facility] [phone] Pager  07/14/2018  2:59 PM

## 2018-07-15 DIAGNOSIS — L03119 Cellulitis of unspecified part of limb: Secondary | ICD-10-CM

## 2018-07-15 LAB — GLUCOSE, CAPILLARY
Glucose-Capillary: 141 mg/dL — ABNORMAL HIGH (ref 70–99)
Glucose-Capillary: 183 mg/dL — ABNORMAL HIGH (ref 70–99)

## 2018-07-15 LAB — BASIC METABOLIC PANEL
Anion gap: 9 (ref 5–15)
BUN: 13 mg/dL (ref 6–20)
CO2: 28 mmol/L (ref 22–32)
Calcium: 8.7 mg/dL — ABNORMAL LOW (ref 8.9–10.3)
Chloride: 104 mmol/L (ref 98–111)
Creatinine, Ser: 0.93 mg/dL (ref 0.44–1.00)
GFR calc Af Amer: >60 mL/min (ref >60)
GFR calc non Af Amer: >60 mL/min (ref >60)
Glucose, Bld: 144 mg/dL — ABNORMAL HIGH (ref 70–99)
Potassium: 4.4 mmol/L (ref 3.5–5.1)
Sodium: 141 mmol/L (ref 135–145)

## 2018-07-15 MED ORDER — AMOXICILLIN 500 MG PO CAPS: 500.0000 mg | ORAL_CAPSULE | Freq: Three times a day (TID) | ORAL | 0 refills | Status: AC | PRN

## 2018-07-15 MED ORDER — FLUCONAZOLE 150 MG PO TABS: 150.0000 mg | ORAL_TABLET | ORAL | 1 refills | Status: DC | PRN

## 2018-07-15 MED ORDER — FLUCONAZOLE 150 MG PO TABS
150.0000 mg | ORAL_TABLET | Freq: Once | ORAL | Status: AC
  Administered 2018-07-15: 150 mg via ORAL
  Filled 2018-07-15: qty 1

## 2018-07-15 MED ORDER — FUROSEMIDE 20 MG PO TABS: 20.0000 mg | ORAL_TABLET | Freq: Every day | ORAL | 4 refills | Status: DC

## 2018-07-15 MED FILL — FUROSEMIDE 20 MG TAB: 20 | 15 days supply | Qty: 30 | Fill #0

## 2018-07-15 MED FILL — AMOXICILLIN 500 MG CAPS: 500 | 7 days supply | Qty: 21 | Fill #0

## 2018-07-15 MED FILL — FLUCONAZOLE 150 MG TABLET: 150 | 5 days supply | Qty: 5 | Fill #0

## 2018-07-15 NOTE — Discharge Summary (Signed)
Physician Discharge Summary   Patient ID: Cheyenne Guzman MRN: [id number] DOB/AGE: 08/26/1965 53 y.o.  Admit date: 07/09/2018 Discharge date: 07/15/2018  Primary Care Physician:  Laurey Morale, MD   Recommendations for Outpatient Follow-up:  1. Follow up with PCP in 1-2 weeks  Home Health: None  Equipment/Devices:   Discharge Condition: stable  CODE STATUS: FULL  Diet recommendation: Carb modified diet   Discharge Diagnoses:    . Recurrent left lower extremity cellulitis . Sepsis ([place]) . Diabetes mellitus type 2 in obese ([place]) . History of pulmonary embolism ([facility]) Lymphedema left lower extremity Right groin pain Acute kidney injury Yeast vaginitis  Consults: Infectious disease    Allergies:   Allergies  Allergen Reactions  . Vicodin [Hydrocodone-Acetaminophen] Nausea And Vomiting     DISCHARGE MEDICATIONS: Allergies as of 07/15/2018      Reactions   Vicodin [hydrocodone-acetaminophen] Nausea And Vomiting      Medication List    TAKE these medications   albuterol (2.5 MG/3ML) 0.083% nebulizer solution Commonly known as: PROVENTIL 1 VIAL IN NEBULIZER EVERY 4 HOURS AS NEEDED FOR WHEEZING What changed: See the new instructions.   albuterol 108 (90 Base) MCG/ACT inhaler Commonly known as: VENTOLIN HFA Inhale 2 puffs into the lungs every 4 (four) hours as needed for wheezing or shortness of breath. What changed: Another medication with the same name was changed. Make sure you understand how and when to take each.   ALPRAZolam 1 MG tablet Commonly known as: XANAX TAKE 1 TABLET BY MOUTH THREE TIMES A DAY   amoxicillin 500 MG capsule Commonly known as: AMOXIL Take 1 capsule (500 mg total) by mouth 3 (three) times daily as needed for up to 7 days (abortive treatment for cellulitis if it recurs).   AZO-CRANBERRY PO Take 2 tablets by mouth daily.   Buprenorphine 15 MCG/HR Ptwk Place 1 patch onto the skin every Saturday.   Contour Next Monitor w/Device  Kit 1 each by Does not apply route 2 (two) times daily. To check blood sugars twice a day.   fluconazole 150 MG tablet Commonly known as: DIFLUCAN Take 1 tablet (150 mg total) by mouth every three (3) days as needed (for yeast infection). Start taking on: July 18, 2018   furosemide 20 MG tablet Commonly known as: LASIX Take 1 tablet (20 mg total) by mouth daily. Take extra 55m as needed if swelling worse. What changed:   medication strength  how much to take  additional instructions   gabapentin 300 MG capsule Commonly known as: NEURONTIN Take 300 mg by mouth 2 (two) times daily.   glucose blood test strip Use as instructed   glucose blood test strip Commonly known as: Contour Next Test Use to test blood sugar 2 times daily   Insulin Glargine 100 UNIT/ML Solostar Pen Commonly known as: Lantus SoloStar Inject 280 Units into the skin every morning. and pen needles 3/day What changed:   how much to take  when to take this   levothyroxine 75 MCG tablet Commonly known as: SYNTHROID Take 1 tablet (75 mcg total) by mouth daily.   methocarbamol 750 MG tablet Commonly known as: ROBAXIN Take 1 tablet (750 mg total) by mouth 2 (two) times daily. What changed: when to take this   rivaroxaban 20 MG Tabs tablet Commonly known as: XARELTO Take 20 mg by mouth daily.   sertraline 100 MG tablet Commonly known as: ZOLOFT TAKE 2 TABLETS (200 MG TOTAL) BY MOUTH AT BEDTIME. What changed: when to  take this        Brief H and P: For complete details please refer to admission H and P, but in briefPatient is a 53 year old female with diabetes type 2, IDDM, PE on Xarelto, hypothyroidism, hyperlipidemia, RA, asthma, depression, anxiety, chronic pain syndrome, OSA, morbid obesity who was admitted in May for left lower extremity cellulitis, placed on IV Rocephin while inpatient, then discharged on Keflex for 10-day course.  Patient reported that she finished the antibiotics, felt  better and started noticing redness, painful and swelling of the left lower extremity.  Patient is on Xarelto at home.  She also reports pain in the right groin. In ED temp 102.4, tachycardia, WBC 13.0, creatinine 1.2  Hospital Course:   Sepsis secondary to left lower extremity cellulitis -Doppler ultrasound of the left lower extremity negative for DVT -CT tib-fib negative for any myositis/abscess or hematoma. -Patient was placed on IV of Ancef, infectious disease was consulted due to recurrent left lower extremity cellulitis complicated with significant edema and lymphedema  -Also has significant lymphedema in the left lower extremity, recommended to follow-up at [facility] in the lymphedema clinic -Patient received Lasix for the lower extremity edema, -She has received 6 days of IV Ancef, ID gave 1 dose of oritavancin on 6/22, recommended no further antibiotics at this time -Patient was given a prescription for amoxicillin for abortive treatment if cellulitis reoccurs with instruction to follow-up with MD if no improvement   Right groin pain -Ultrasound abdomen showed complex mass in the right groin, lymphadenopathy versus abscess versus hernia or vascular lesion -CT abdomen pelvis negative for any mass or abscess -Right groin pain improving   History of Pulmonary embolism ([place]) Continue Xarelto, H&H stable    Diabetes mellitus type 2 in obese ([place]) -Hemoglobin A1c 9.0 on 06/19/2018 -Patient reports taking Lantus 285 units daily, endocrinologist, Dr. Loanne Drilling CBGs currently stable.  Patient will continue outpatient insulin regimen.  History of asthma Currently stable, no wheezing  Depression, anxiety Continue Xanax at current dosing   OSA Continue CPAP  Morbid obesity BMI 62.6, recommended diet and weight control  Acute kidney injury -Likely due to sepsis, Toradol, DC NSAIDs Improved, creatinine back to baseline 0.9    Day of Discharge S: Feels a lot better today,  left lower extremity cellulitis improving, hoping to go home today.  BP 121/62 (BP Location: Right Arm)   Pulse 82   Temp 97.7 F (36.5 C) (Oral)   Resp 18   Ht _0  (1.702 m)   Wt (!) 181.4 kg   SpO2 97%   BMI 62.65 kg/m   Physical Exam: General: Alert and awake oriented x3 not in any acute distress. HEENT: anicteric sclera, pupils reactive to light and accommodation CVS: S1-S2 clear no murmur rubs or gallops Chest: clear to auscultation bilaterally, no wheezing rales or rhonchi Abdomen: soft nontender, nondistended, normal bowel sounds Extremities: Left lower extremity edema improving, erythema and tenderness improving Neuro: Cranial nerves II-XII intact, no focal neurological deficits   The results of significant diagnostics from this hospitalization (including imaging, microbiology, ancillary and laboratory) are listed below for reference.      Procedures/Studies:  US Pelvis Limited (transabdominal Only)  Result Date: 07/11/2018 CLINICAL DATA:  Right groin pain.  History of breast cancer. EXAM: LIMITED ULTRASOUND OF PELVIS TECHNIQUE: Limited transabdominal ultrasound examination of the pelvis was performed. COMPARISON:  CT 12/27/2014, 02/07/2012. FINDINGS: Complex mass is noted in the right groin. This was difficult to measure. This could represent lymphadenopathy. An abscess  cannot be excluded. A hernia cannot be excluded. A vascular lesion is less likely. No prominent internal color flow noted. IV and oral contrast-enhanced CT of the abdomen and pelvis may prove useful for further evaluation. IMPRESSION: Prominent complex mass right groin for which further evaluation with IV and oral contrast-enhanced CT of the abdomen pelvis may prove useful for further evaluation. Electronically Signed   By: Marcello Moores  Register   On: 07/11/2018 16:12   Ct Abdomen Pelvis W Contrast  Result Date: 07/12/2018 CLINICAL DATA:  52 year old female with RIGHT groin pain and possible mass identified on  recent ultrasound. EXAM: CT ABDOMEN AND PELVIS WITH CONTRAST TECHNIQUE: Multidetector CT imaging of the abdomen and pelvis was performed using the standard protocol following bolus administration of intravenous contrast. Delayed images through the UPPER thighs were obtained. CONTRAST:  166m OMNIPAQUE IOHEXOL 300 MG/ML  SOLN COMPARISON:  07/11/2018 ultrasound and 12/27/2014 CT FINDINGS: Lower chest: No acute abnormality Hepatobiliary: The liver and gallbladder are unremarkable. No biliary dilatation. Pancreas: Unremarkable Spleen: Unremarkable Adrenals/Urinary Tract: The kidneys, adrenal glands and bladder are unremarkable. Stomach/Bowel: Stomach is within normal limits. Appendix appears normal. No evidence of bowel wall thickening, distention, or inflammatory changes. Vascular/Lymphatic: Aortic atherosclerosis. No enlarged abdominal or pelvic lymph nodes. Reproductive: Status post hysterectomy. No adnexal masses. Other: Moderate skin thickening and subcutaneous thickening/edema/inflammation is noted of the soft tissues of the MEDIAL UPPER RIGHT thigh. This is compatible with the findings identified sonographically and likely represents inflammation or possibly infection. No discrete abscess or mass is noted. No ascites, pneumoperitoneum or abdominal wall hernia. Musculoskeletal: No acute or suspicious bony abnormalities. L4-L5 lumbar fusion changes noted. IMPRESSION: 1. Diffuse skin thickening and subcutaneous thickening/edema/inflammation within the MEDIAL UPPER RIGHT thigh, likely infectious or inflammatory, corresponding to the recent sonographic finding. No discrete mass or abscess. Clinical correlation/follow-up recommended. 2.  Aortic Atherosclerosis (ICD10-I70.0). Electronically Signed   By: JMargarette CanadaM.D.   On: 07/12/2018 18:18   Ct Tibia Fibula Left Wo Contrast  Result Date: 07/11/2018 CLINICAL DATA:  Redness, pain, and swelling of the left lower extremity. Recent cellulitis. Fever. Elevated white  blood count. EXAM: CT OF THE LOWER LEFT EXTREMITY WITHOUT CONTRAST TECHNIQUE: Multidetector CT imaging of the left lower leg was performed according to the standard protocol. COMPARISON:  None. FINDINGS: Bones/Joint/Cartilage There are slight arthritic changes at the left knee and left ankle without appreciable joint effusions. No osteomyelitis or fracture. Muscles and Tendons Normal. Soft tissues There is extensive circumferential subcutaneous edema most prominent anteriorly. There is no definable abscess. There is thickening of the scan with a few benign calcifications in the soft tissues. Varicose veins are seen in the posteromedial aspect of the distal right thigh. IMPRESSION: No evidence of soft tissue abscess, myositis or osteomyelitis or joint effusions. Extensive subcutaneous edema in the lower leg, nonspecific. This could represent cellulitis or benign edema. Electronically Signed   By: JLorriane ShireM.D.   On: 07/11/2018 12:20   Dg Chest Portable 1 View  Result Date: 06/19/2018 CLINICAL DATA:  Fever and chills.  Leg swelling. EXAM: PORTABLE CHEST 1 VIEW COMPARISON:  CT chest dated March 12, 2018. Chest x-ray dated March 11, 2018. FINDINGS: Stable mild cardiomegaly. Normal mediastinal contours. Normal pulmonary vascularity. No focal consolidation, pleural effusion, or pneumothorax. No acute osseous abnormality. IMPRESSION: No active disease. Electronically Signed   By: WTitus DubinM.D.   On: 06/19/2018 15:42   Dg Hip Unilat With Pelvis 2-3 Views Right  Result Date: 07/09/2018 CLINICAL DATA:  Right  groin pain for 2 days. Cellulitis in the left lower leg and sepsis. EXAM: DG HIP (WITH OR WITHOUT PELVIS) 2-3V RIGHT COMPARISON:  None. FINDINGS: There is no evidence of hip fracture or dislocation. Mild osteoarthrosis of the hip joints with acetabular osteophytosis. IMPRESSION: No acute fracture or dislocation identified. Electronically Signed   By: Kristine Garbe M.D.   On:  07/09/2018 22:53   Vas [place] Lower Extremity Venous (dvt)  Result Date: 07/11/2018  Lower Venous Study Indications: Pain, Edema, and Erythema. Other Indications: Cellulitus left lower extremity. Risk Factors: History of PE in February 2020. Limitations: Body habitus 399 lbs. Comparison Study: No comparison study Performing Technologist: Toma Copier RVS  Examination Guidelines: A complete evaluation includes B-mode imaging, spectral Doppler, color Doppler, and power Doppler as needed of all accessible portions of each vessel. Bilateral testing is considered an integral part of a complete examination. Limited examinations for reoccurring indications may be performed as noted.  +-----+---------------+---------+-----------+----------+-------+ RIGHTCompressibilityPhasicitySpontaneityPropertiesSummary +-----+---------------+---------+-----------+----------+-------+ CFV  Full           Yes      Yes                          +-----+---------------+---------+-----------+----------+-------+ SFJ  Full                                                 +-----+---------------+---------+-----------+----------+-------+   +---------+---------------+---------+-----------+----------+-------------------+ LEFT     CompressibilityPhasicitySpontaneityPropertiesSummary             +---------+---------------+---------+-----------+----------+-------------------+ CFV      Full                                                             +---------+---------------+---------+-----------+----------+-------------------+ SFJ      Full                                                             +---------+---------------+---------+-----------+----------+-------------------+ FV Prox  Full                                                             +---------+---------------+---------+-----------+----------+-------------------+ FV Mid   Full                                                              +---------+---------------+---------+-----------+----------+-------------------+ FV Distal  Not visualized      +---------+---------------+---------+-----------+----------+-------------------+ PFV                                                   Not visualized      +---------+---------------+---------+-----------+----------+-------------------+ POP      Full                                                             +---------+---------------+---------+-----------+----------+-------------------+ PTV                                                   Not fully                                                                 visualized due to                                                         body habitus and                                                          pain                +---------+---------------+---------+-----------+----------+-------------------+   Left Technical Findings: Not visualized segments include multiple segments of the veins. Unable to visualize the peroneal or the distal femoral or profunda femoris. Severely limited and inconclusive. Enlargement of the inguinal lymph nodes noted.   Summary: Right: There is no obvious evidence of a common femoral vein obstruction. Left: There is no obvious evidence of deep vein thrombosis in the segments of the veins able to be imaged in the lower extremity. However, portions of this examination were limited- see technologist comments above.  *See table(s) above for measurements and observations. Electronically signed by Monica Martinez MD on 07/11/2018 at 4:50:35 PM.    Final    Vas [place] Lower Extremity Venous (dvt) (mc And Wl 7a-7p)  Result Date: 06/20/2018  Lower Venous Study Indications: Swelling, and Edema.  Limitations: Body habitus, poor ultrasound/tissue interface and patient positioning. Performing Technologist: Abram Sander RVS  Examination  Guidelines: A complete evaluation includes B-mode imaging, spectral Doppler, color Doppler, and power Doppler as needed of all accessible portions of each vessel. Bilateral testing is considered an integral part of a complete examination. Limited examinations for reoccurring indications may be performed as noted.  +---------+---------------+---------+-----------+----------+--------------+ LEFT     CompressibilityPhasicitySpontaneityPropertiesSummary        +---------+---------------+---------+-----------+----------+--------------+  CFV      Full                                                        +---------+---------------+---------+-----------+----------+--------------+ SFJ      Full                                                        +---------+---------------+---------+-----------+----------+--------------+ FV Prox  Full                                                        +---------+---------------+---------+-----------+----------+--------------+ FV Mid                                                Not visualized +---------+---------------+---------+-----------+----------+--------------+ FV Distal                                             Not visualized +---------+---------------+---------+-----------+----------+--------------+ PFV      Full                                                        +---------+---------------+---------+-----------+----------+--------------+ POP                                                   Not visualized +---------+---------------+---------+-----------+----------+--------------+ PTV                                                   Not visualized +---------+---------------+---------+-----------+----------+--------------+ PERO                                                  Not visualized +---------+---------------+---------+-----------+----------+--------------+     Summary: Left: There is no  evidence of deep vein thrombosis in the lower extremity. However, portions of this examination were limited- see technologist comments above. No cystic structure found in the popliteal fossa.  *See table(s) above for measurements and observations. Electronically signed by Monica Martinez MD on 06/20/2018 at 5:24:03 PM.    Final        LAB RESULTS: Basic Metabolic Panel: Recent Labs  Lab 07/14/18 0422 07/15/18 0838  NA 138 141  K 3.9 4.4  CL 101 104  CO2 26 28  GLUCOSE 106* 144*  BUN 15 13  CREATININE 1.06* 0.93  CALCIUM 9.3 8.7*   Liver Function Tests: No results for input(s): AST, ALT, ALKPHOS, BILITOT, PROT, ALBUMIN in the last 168 hours. No results for input(s): LIPASE, AMYLASE in the last 168 hours. No results for input(s): AMMONIA in the last 168 hours. CBC: Recent Labs  Lab 07/09/18 2030  07/12/18 0917 07/13/18 1020  WBC 13.0*   < > 7.0 6.8  NEUTROABS 11.5*  --   --   --   HGB 14.2   < > 11.2* 11.6*  HCT 47.3*   < > 37.4 39.0  MCV 80.2   < > 79.2* 79.4*  PLT PLATELET CLUMPS NOTED ON SMEAR, UNABLE TO ESTIMATE   < > 198 212   < > = values in this interval not displayed.   Cardiac Enzymes: No results for input(s): CKTOTAL, CKMB, CKMBINDEX, TROPONINI in the last 168 hours. BNP: Invalid input(s): POCBNP CBG: Recent Labs  Lab 07/14/18 2002 07/15/18 0725  GLUCAP 253* 141*      Disposition and Follow-up: Discharge Instructions    Diet - low sodium heart healthy   Complete by: As directed    Diet Carb Modified   Complete by: As directed    Discharge instructions   Complete by: As directed    Please take amoxicillin only if cellulitis recurs for total 7 days. But if not improving in 1-2 days, call your doctor.   Increase activity slowly   Complete by: As directed        DISPOSITION: *Home   DISCHARGE FOLLOW-UP Follow-up Information    Laurey Morale, MD. Schedule an appointment as soon as possible for a visit in 2 week(s).   Specialty: Family  Medicine Contact information: [place] [address] [phone]            Time coordinating discharge:  45 minutes  Signed:   Estill Cotta M.D. [facility] 07/15/2018, 11:48 AM

## 2018-07-15 NOTE — Plan of Care (Signed)
  Problem: Clinical Measurements: Goal: Ability to maintain clinical measurements within normal limits will improve Outcome: Progressing Goal: Will remain free from infection Outcome: Progressing   Problem: Skin Integrity: Goal: Risk for impaired skin integrity will decrease Outcome: Progressing

## 2018-07-15 NOTE — Progress Notes (Signed)
Discharge instructions provided to the patient.  Those instructions were reviewed and the patient verbalizes understanding.  The patient will be discharged to her home.  The daughter will transport the patient back to home.  The patient is aware of follow up and medications.

## 2018-07-16 ENCOUNTER — Telehealth: Payer: Self-pay | Admitting: *Deleted

## 2018-07-16 NOTE — Telephone Encounter (Signed)
Transition Care Management Follow-up Telephone Call   Date discharged? 06.23.2020   How have you been since you were released from the hospital? I've been so so    Do you understand why you were in the hospital? yes   Do you understand the discharge instructions? yes   Where were you discharged to? Home    Items Reviewed:  Medications reviewed: yes  Allergies reviewed: yes  Dietary changes reviewed: N/A   Referrals reviewed: yes (needs referral from PCP to Lymphedema Clinic)   Functional Questionnaire:   Activities of Daily Living (ADLs):   She states they are independent in the following: Not at the moment d/t swelling of leg  States they require assistance with the following: ambulation, bathing and hygiene, feeding, continence, grooming, toileting and dressing   Any transportation issues/concerns?: no   Any patient concerns? no   Confirmed importance and date/time of follow-up visits scheduled yes   Provider Appointment booked with Dr. Sarajane Jews (virtually) 07/17/2018 at 10:45 AM   Confirmed with patient if condition begins to worsen call PCP or go to the ER.  Patient was given the office number and encouraged to call back with question or concerns.  : yes

## 2018-07-17 ENCOUNTER — Ambulatory Visit (INDEPENDENT_AMBULATORY_CARE_PROVIDER_SITE_OTHER): Payer: Medicare Other | Admitting: Family Medicine

## 2018-07-17 ENCOUNTER — Encounter: Payer: Self-pay | Admitting: Family Medicine

## 2018-07-17 ENCOUNTER — Other Ambulatory Visit: Payer: Self-pay

## 2018-07-17 DIAGNOSIS — E1169 Type 2 diabetes mellitus with other specified complication: Secondary | ICD-10-CM

## 2018-07-17 DIAGNOSIS — I890 Lymphedema, not elsewhere classified: Secondary | ICD-10-CM

## 2018-07-17 DIAGNOSIS — R600 Localized edema: Secondary | ICD-10-CM | POA: Diagnosis not present

## 2018-07-17 DIAGNOSIS — L03116 Cellulitis of left lower limb: Secondary | ICD-10-CM | POA: Diagnosis not present

## 2018-07-17 DIAGNOSIS — A419 Sepsis, unspecified organism: Secondary | ICD-10-CM

## 2018-07-17 DIAGNOSIS — E669 Obesity, unspecified: Secondary | ICD-10-CM

## 2018-07-17 NOTE — Progress Notes (Signed)
Subjective:    Patient ID: Cheyenne Guzman, female    DOB: 08-31-1965, 53 y.o.   MRN: [id number]  HPI This is a transitional care visit to follow up a hospital stay from 07-09-18 to 07-15-18 for sepsis from a cellulitis in the left leg. She has lymphedema in the left leg with chronic swelling, and cellulitis is prone to develop there. She was treated for cellulitis a few weeks ago with IV Rocephin and a course of Keflex. However within 48 hours of stopping the Keflex the cellulitis returned, and she quickly became ill with a fever over 103 degrees. On admission she showed acute renal failure and this was corrected with hydration. Her creatinine had returned to normal at 0.9 by DC. The leg was red and warm. Blood cultures remained negative. Infectious Disease was consulted and she was given IV Ancef and then a dose of IV Oritavancin (which is designed to give her coverage for 14 days). She was also given a supply of Amoxicillin to treat herself with if any signs of cellulitis return. Her leg has looked good since going home and she has not had any fever. A venous doppler of the leg was negative for DVT (she is still taking Xarelto). It was suggested to her that she be referred to the Lymphedema Clinic. Also her diabetes was found to be poorly controlled, and the A1c was 9.0 despite taking very large amounts of insulin.  Virtual Visit via Video Note  I connected with the patient on 07/17/18 at 10:30 AM EDT by a video enabled telemedicine application and verified that I am speaking with the correct person using two identifiers.  Location patient: home Location provider:work or home office Persons participating in the virtual visit: patient, provider  I discussed the limitations of evaluation and management by telemedicine and the availability of in person appointments. The patient expressed understanding and agreed to proceed.   HPI:    ROS: See pertinent positives and negatives per HPI.  Past  Medical History:  Diagnosis Date  . Anxiety    per pt. - mild   . Arthritis    back & ankles   . Asthma    seasonal   . [facility])    breast, sees Dr. Jana Hakim , Left - 11/2007  . Complication of anesthesia 2013   severe muscle spasms-sore-no doc  . Depression   . Diabetes mellitus    sees Dr. Chalmers Cater, diagnosed 2003  . HA (headache)   . Hearing loss    Went to [facility]  . Hyperlipidemia   . Kidney infection    - hosp. [facility]- septic- 10/2009  . Low back pain   . Peripheral neuropathy    R sided numbness- face & jaw  . Sleep apnea 9/15   severe-just started cpap-doing well  . TIA (transient ischemic attack)    in 2007,2008, and 2011  . Tinnitus of right ear   . Urosepsis 08-2009   with Klebsiella    Past Surgical History:  Procedure Laterality Date  . ABDOMINAL HYSTERECTOMY  08/2004  . BREAST BIOPSY  06/22/2011   Procedure: BREAST BIOPSY;  Surgeon: Stark Klein, MD;  Location: [place];  Service: General;  Laterality: Right;  . BREAST SURGERY  11/2007   left ductal carcinoma in situ  . CARPAL TUNNEL RELEASE Right 10/13/2013   Procedure: RIGHT CARPAL TUNNEL RELEASE;  Surgeon: Hessie Dibble, MD;  Location: [place];  Service: Orthopedics;  Laterality: Right;  . CARPAL TUNNEL RELEASE Left 11/24/2013   Procedure: LEFT CARPAL TUNNEL RELEASE WITH CYST EXCISION ;  Surgeon: Hessie Dibble, MD;  Location: [place];  Service: Orthopedics;  Laterality: Left;  . EAR CYST EXCISION Left 11/24/2013   Procedure: CYST REMOVAL;  Surgeon: Hessie Dibble, MD;  Location: [place];  Service: Orthopedics;  Laterality: Left;  . FOOT SURGERY  2000   plantar fasciitis-left  . LUMBAR DISC SURGERY  03-29-14   Fusion, revision 04-27-14 L4-5 Cauda Equina with graft  . NERVE GRAFT  06-01-08   cadaver graft to right inferior alveolar nerve  [place]    Family History  Problem Relation Age of Onset  .  Cancer Mother        breast  . Cancer Father        lung  . Diabetes Father   . Cancer Sister        colon  . Cancer Maternal Aunt        breast - both  . Diabetes Sister   . Anesthesia problems Neg Hx      Current Outpatient Medications:  .  albuterol (PROVENTIL HFA;VENTOLIN HFA) 108 (90 Base) MCG/ACT inhaler, Inhale 2 puffs into the lungs every 4 (four) hours as needed for wheezing or shortness of breath., Disp: 1 Inhaler, Rfl: 11 .  albuterol (PROVENTIL) (2.5 MG/3ML) 0.083% nebulizer solution, 1 VIAL IN NEBULIZER EVERY 4 HOURS AS NEEDED FOR WHEEZING (Patient taking differently: Take 2.5 mg by nebulization every 4 (four) hours as needed for wheezing. ), Disp: 75 mL, Rfl: 1 .  ALPRAZolam (XANAX) 1 MG tablet, TAKE 1 TABLET BY MOUTH THREE TIMES A DAY (Patient taking differently: Take 1 mg by mouth 3 (three) times daily. ), Disp: 90 tablet, Rfl: 5 .  amoxicillin (AMOXIL) 500 MG capsule, Take 1 capsule (500 mg total) by mouth 3 (three) times daily as needed for up to 7 days (abortive treatment for cellulitis if it recurs)., Disp: 21 capsule, Rfl: 0 .  AZO-CRANBERRY PO, Take 2 tablets by mouth daily., Disp: , Rfl:  .  Blood Glucose Monitoring Suppl (CONTOUR NEXT MONITOR) w/Device KIT, 1 each by Does not apply route 2 (two) times daily. To check blood sugars twice a day., Disp: 1 kit, Rfl: 0 .  Buprenorphine 15 MCG/HR PTWK, Place 1 patch onto the skin every Saturday. , Disp: , Rfl: 0 .  [START ON 07/18/2018] fluconazole (DIFLUCAN) 150 MG tablet, Take 1 tablet (150 mg total) by mouth every three (3) days as needed (for yeast infection)., Disp: 5 tablet, Rfl: 1 .  furosemide (LASIX) 20 MG tablet, Take 1 tablet (20 mg total) by mouth daily. Take extra 26m as needed if swelling worse., Disp: 30 tablet, Rfl: 4 .  gabapentin (NEURONTIN) 300 MG capsule, Take 300 mg by mouth 2 (two) times daily. , Disp: , Rfl:  .  glucose blood (CONTOUR NEXT TEST) test strip, Use to test blood sugar 2 times daily, Disp:  100 each, Rfl: 12 .  glucose blood test strip, Use as instructed, Disp: 100 each, Rfl: 0 .  Insulin Glargine (LANTUS SOLOSTAR) 100 UNIT/ML Solostar Pen, Inject 280 Units into the skin every morning. and pen needles 3/day (Patient taking differently: Inject 285 Units into the skin daily. and pen needles 3/day), Disp: 30 pen, Rfl: 11 .  levothyroxine (SYNTHROID, LEVOTHROID) 75 MCG tablet, Take 1 tablet (75 mcg total) by mouth daily., Disp: 90 tablet, Rfl: 3 .  methocarbamol (ROBAXIN) 750 MG tablet, Take 1 tablet (750 mg total) by mouth 2 (two) times daily. (Patient taking differently: Take 750 mg by mouth 3 (three) times daily. ), Disp: 60 tablet, Rfl: 5 .  rivaroxaban (XARELTO) 20 MG TABS tablet, Take 20 mg by mouth daily., Disp: , Rfl:  .  sertraline (ZOLOFT) 100 MG tablet, TAKE 2 TABLETS (200 MG TOTAL) BY MOUTH AT BEDTIME. (Patient taking differently: Take 200 mg by mouth daily. ), Disp: 180 tablet, Rfl: 3  EXAM:  VITALS per patient if applicable:  GENERAL: alert, oriented, appears well and in no acute distress  HEENT: atraumatic, conjunttiva clear, no obvious abnormalities on inspection of external nose and ears  NECK: normal movements of the head and neck  LUNGS: on inspection no signs of respiratory distress, breathing rate appears normal, no obvious gross SOB, gasping or wheezing  CV: no obvious cyanosis  MS: moves all visible extremities without noticeable abnormality  PSYCH/NEURO: pleasant and cooperative, no obvious depression or anxiety, speech and thought processing grossly intact  ASSESSMENT AND PLAN: She is recovering from a bout of sepsis which started as recurrent cellulitis of the left leg. She still has some of the benefits of Oritavancin active in her body. We will refer her to the Lymphedema Clinic for management of the left leg issues. We will refer her for a second opinion on managing her diabetes as well. Her renal function has normalized.  [name], MD   Discussed the following assessment and plan:  No diagnosis found.     I discussed the assessment and treatment plan with the patient. The patient was provided an opportunity to ask questions and all were answered. The patient agreed with the plan and demonstrated an understanding of the instructions.   The patient was advised to call back or seek an in-person evaluation if the symptoms worsen or if the condition fails to improve as anticipated.    Review of Systems     Objective:   Physical Exam        Assessment & Plan:

## 2018-07-20 ENCOUNTER — Other Ambulatory Visit: Payer: Self-pay | Admitting: Family Medicine

## 2018-07-30 ENCOUNTER — Other Ambulatory Visit: Payer: Self-pay

## 2018-07-30 ENCOUNTER — Encounter: Payer: Self-pay | Admitting: Family Medicine

## 2018-07-30 ENCOUNTER — Encounter (HOSPITAL_COMMUNITY): Payer: Self-pay

## 2018-07-30 ENCOUNTER — Inpatient Hospital Stay (HOSPITAL_COMMUNITY)
Admission: EM | Admit: 2018-07-30 | Discharge: 2018-08-02 | DRG: 872 | Disposition: A | Discharge status: Home / Self Care | Payer: Medicare Other | Attending: Internal Medicine | Admitting: Internal Medicine

## 2018-07-30 ENCOUNTER — Encounter: Payer: Self-pay | Admitting: Family

## 2018-07-30 ENCOUNTER — Ambulatory Visit (INDEPENDENT_AMBULATORY_CARE_PROVIDER_SITE_OTHER): Payer: Medicare Other | Admitting: Family

## 2018-07-30 DIAGNOSIS — Z833 Family history of diabetes mellitus: Secondary | ICD-10-CM

## 2018-07-30 DIAGNOSIS — G8929 Other chronic pain: Secondary | ICD-10-CM | POA: Diagnosis present

## 2018-07-30 DIAGNOSIS — A498 Other bacterial infections of unspecified site: Secondary | ICD-10-CM | POA: Diagnosis present

## 2018-07-30 DIAGNOSIS — L03119 Cellulitis of unspecified part of limb: Secondary | ICD-10-CM | POA: Diagnosis present

## 2018-07-30 DIAGNOSIS — E1169 Type 2 diabetes mellitus with other specified complication: Secondary | ICD-10-CM | POA: Diagnosis present

## 2018-07-30 DIAGNOSIS — I2699 Other pulmonary embolism without acute cor pulmonale: Secondary | ICD-10-CM | POA: Diagnosis present

## 2018-07-30 DIAGNOSIS — A4153 Sepsis due to Serratia: Secondary | ICD-10-CM | POA: Diagnosis not present

## 2018-07-30 DIAGNOSIS — I890 Lymphedema, not elsewhere classified: Secondary | ICD-10-CM

## 2018-07-30 DIAGNOSIS — E785 Hyperlipidemia, unspecified: Secondary | ICD-10-CM | POA: Diagnosis present

## 2018-07-30 DIAGNOSIS — G4733 Obstructive sleep apnea (adult) (pediatric): Secondary | ICD-10-CM | POA: Diagnosis present

## 2018-07-30 DIAGNOSIS — Z1159 Encounter for screening for other viral diseases: Secondary | ICD-10-CM

## 2018-07-30 DIAGNOSIS — Z794 Long term (current) use of insulin: Secondary | ICD-10-CM

## 2018-07-30 DIAGNOSIS — F419 Anxiety disorder, unspecified: Secondary | ICD-10-CM | POA: Diagnosis present

## 2018-07-30 DIAGNOSIS — E669 Obesity, unspecified: Secondary | ICD-10-CM | POA: Diagnosis present

## 2018-07-30 DIAGNOSIS — Z6841 Body Mass Index (BMI) 40.0 and over, adult: Secondary | ICD-10-CM

## 2018-07-30 DIAGNOSIS — L03116 Cellulitis of left lower limb: Secondary | ICD-10-CM | POA: Diagnosis present

## 2018-07-30 DIAGNOSIS — A419 Sepsis, unspecified organism: Secondary | ICD-10-CM | POA: Diagnosis present

## 2018-07-30 DIAGNOSIS — E039 Hypothyroidism, unspecified: Secondary | ICD-10-CM | POA: Diagnosis present

## 2018-07-30 DIAGNOSIS — Z7901 Long term (current) use of anticoagulants: Secondary | ICD-10-CM

## 2018-07-30 DIAGNOSIS — D0510 Intraductal carcinoma in situ of unspecified breast: Secondary | ICD-10-CM | POA: Diagnosis present

## 2018-07-30 DIAGNOSIS — Z86711 Personal history of pulmonary embolism: Secondary | ICD-10-CM

## 2018-07-30 DIAGNOSIS — Z87891 Personal history of nicotine dependence: Secondary | ICD-10-CM

## 2018-07-30 DIAGNOSIS — J45909 Unspecified asthma, uncomplicated: Secondary | ICD-10-CM | POA: Diagnosis present

## 2018-07-30 DIAGNOSIS — R7881 Bacteremia: Secondary | ICD-10-CM | POA: Diagnosis present

## 2018-07-30 DIAGNOSIS — E1140 Type 2 diabetes mellitus with diabetic neuropathy, unspecified: Secondary | ICD-10-CM | POA: Diagnosis present

## 2018-07-30 DIAGNOSIS — Z8673 Personal history of transient ischemic attack (TIA), and cerebral infarction without residual deficits: Secondary | ICD-10-CM

## 2018-07-30 DIAGNOSIS — F32A Depression, unspecified: Secondary | ICD-10-CM | POA: Diagnosis present

## 2018-07-30 DIAGNOSIS — Z853 Personal history of malignant neoplasm of breast: Secondary | ICD-10-CM

## 2018-07-30 DIAGNOSIS — F329 Major depressive disorder, single episode, unspecified: Secondary | ICD-10-CM | POA: Diagnosis present

## 2018-07-30 MED ORDER — SODIUM CHLORIDE 0.9% FLUSH: 3.0000 mL | Freq: Once | INTRAVENOUS | Status: DC

## 2018-07-30 NOTE — Assessment & Plan Note (Signed)
Cellulitis of the left lower extremity appears resolved with oritavancin and cefazolin treatments.  Discussed importance of maintaining leg elevation and compression to reduce lymphedema and recurrence of cellulitis.  She does have follow-up appointment with lymphedema clinic in the next couple weeks.  No further treatment is necessary at this time.  Plan for follow-up as needed.

## 2018-07-30 NOTE — Progress Notes (Signed)
Subjective:    Patient ID: Cheyenne Guzman, female    DOB: May 09, 1965, 53 y.o.   MRN: [id number]  Chief Complaint  Patient presents with  . Cellulitis     Virtual Visit via Telephone Note   I connected with Cheyenne Guzman on 07/30/2018 at 2:45 pm by telephone and verified that I am speaking with the correct person using two identifiers.   I discussed the limitations, risks, security and privacy concerns of performing an evaluation and management service by telephone and the availability of in person appointments. I also discussed with the patient that there may be a patient responsible charge related to this service. The patient expressed understanding and agreed to proceed.   HPI:  Cheyenne Guzman is a 53 y.o. female with history of type 2 diabetes, pulmonary embolism on Xarelto, hypothyroidism, hyperlipidemia, rheumatoid arthritis (not currently on treatment), asthma, depression, anxiety, chronic pain, sleep apnea, and morbid obesity who presented to the hospital on 6/17 after recent discharge for lower left extremity cellulitis that responded to ceftriaxone and oral Keflex for 10 days.  Readmitted with swelling/pain and fever starting 2 to 3 days after stopping antibiotics.  Max temperature noted to be 102.4 F.  Diabetes at the time was not under good control with A1c of 9% in May.  Imaging with no abscess or bone involvement.  Antibiotic therapy initiated with cefazolin.  While hospitalized she completed 6 days of Ancef and was given 1 dose of oritavancin for cellulitis.  It was discussed the importance of leg elevation working on improving chronic lymphedema likely the result of recurrent cellulitis.  Cheyenne Guzman has done well since leaving the hospital with decreased lower extremity edema as well as redness.  She does have some marks on her skin.  Legs are feeling much better and is working on keeping her legs elevated as much as possible.  She has a follow-up appointment with lymphedema  clinic in the next 2 weeks.   Allergies  Allergen Reactions  . Vicodin [Hydrocodone-Acetaminophen] Nausea And Vomiting      Outpatient Medications Prior to Visit  Medication Sig Dispense Refill  . albuterol (PROVENTIL HFA;VENTOLIN HFA) 108 (90 Base) MCG/ACT inhaler Inhale 2 puffs into the lungs every 4 (four) hours as needed for wheezing or shortness of breath. 1 Inhaler 11  . albuterol (PROVENTIL) (2.5 MG/3ML) 0.083% nebulizer solution 1 VIAL IN NEBULIZER EVERY 4 HOURS AS NEEDED FOR WHEEZING (Patient taking differently: Take 2.5 mg by nebulization every 4 (four) hours as needed for wheezing. ) 75 mL 1  . ALPRAZolam (XANAX) 1 MG tablet TAKE 1 TABLET BY MOUTH THREE TIMES A DAY (Patient taking differently: Take 1 mg by mouth 3 (three) times daily. ) 90 tablet 5  . AZO-CRANBERRY PO Take 2 tablets by mouth daily.    . Blood Glucose Monitoring Suppl (CONTOUR NEXT MONITOR) w/Device KIT 1 each by Does not apply route 2 (two) times daily. To check blood sugars twice a day. 1 kit 0  . Buprenorphine 15 MCG/HR PTWK Place 1 patch onto the skin every Saturday.   0  . fluconazole (DIFLUCAN) 150 MG tablet Take 1 tablet (150 mg total) by mouth every three (3) days as needed (for yeast infection). 5 tablet 1  . furosemide (LASIX) 20 MG tablet Take 1 tablet (20 mg total) by mouth daily. Take extra 48m as needed if swelling worse. 30 tablet 4  . gabapentin (NEURONTIN) 300 MG capsule Take 300 mg by mouth 2 (two) times daily.     .Marland Kitchen  glucose blood (CONTOUR NEXT TEST) test strip Use to test blood sugar 2 times daily 100 each 12  . glucose blood test strip Use as instructed 100 each 0  . Insulin Glargine (LANTUS SOLOSTAR) 100 UNIT/ML Solostar Pen Inject 280 Units into the skin every morning. and pen needles 3/day (Patient taking differently: Inject 285 Units into the skin daily. and pen needles 3/day) 30 pen 11  . levothyroxine (SYNTHROID, LEVOTHROID) 75 MCG tablet Take 1 tablet (75 mcg total) by mouth daily. 90  tablet 3  . methocarbamol (ROBAXIN) 750 MG tablet Take 1 tablet (750 mg total) by mouth 2 (two) times daily. (Patient taking differently: Take 750 mg by mouth 3 (three) times daily. ) 60 tablet 5  . rivaroxaban (XARELTO) 20 MG TABS tablet Take 20 mg by mouth daily.    . sertraline (ZOLOFT) 100 MG tablet TAKE 2 TABLETS (200 MG TOTAL) BY MOUTH AT BEDTIME. 180 tablet 3   No facility-administered medications prior to visit.      Past Medical History:  Diagnosis Date  . Anxiety    per pt. - mild   . Arthritis    back & ankles   . Asthma    seasonal   . Cancer [facility])    breast, sees Dr. Jana Hakim , Left - 11/2007  . Complication of anesthesia 2013   severe muscle spasms-sore-no doc  . Depression   . Diabetes mellitus    sees Dr. Chalmers Cater, diagnosed 2003  . HA (headache)   . Hearing loss    Went to [facility]ro[facility]  . Hyperlipidemia   . Kidney infection    - hosp. [facility]- septic- 10/2009  . Low back pain   . Peripheral neuropathy    R sided numbness- face & jaw  . Sleep apnea 9/15   severe-just started cpap-doing well  . TIA (transient ischemic attack)    in 2007,2008, and 2011  . Tinnitus of right ear   . Urosepsis 08-2009   with Klebsiella     Past Surgical History:  Procedure Laterality Date  . ABDOMINAL HYSTERECTOMY  08/2004  . BREAST BIOPSY  06/22/2011   Procedure: BREAST BIOPSY;  Surgeon: Stark Klein, MD;  Location: [place];  Service: General;  Laterality: Right;  . BREAST SURGERY  11/2007   left ductal carcinoma in situ  . CARPAL TUNNEL RELEASE Right 10/13/2013   Procedure: RIGHT CARPAL TUNNEL RELEASE;  Surgeon: Hessie Dibble, MD;  Location: [place];  Service: Orthopedics;  Laterality: Right;  . CARPAL TUNNEL RELEASE Left 11/24/2013   Procedure: LEFT CARPAL TUNNEL RELEASE WITH CYST EXCISION ;  Surgeon: Hessie Dibble, MD;  Location: [place];  Service: Orthopedics;  Laterality: Left;  . EAR CYST EXCISION  Left 11/24/2013   Procedure: CYST REMOVAL;  Surgeon: Hessie Dibble, MD;  Location: [place];  Service: Orthopedics;  Laterality: Left;  . FOOT SURGERY  2000   plantar fasciitis-left  . LUMBAR DISC SURGERY  03-29-14   Fusion, revision 04-27-14 L4-5 Cauda Equina with graft  . NERVE GRAFT  06-01-08   cadaver graft to right inferior alveolar nerve  [place]       Review of Systems  Constitutional: Negative for chills, fatigue and fever.  Eyes:       Denies changes in vision  Respiratory: Negative for chest tightness and shortness of breath.   Cardiovascular: Positive for leg swelling. Negative for chest pain and palpitations.  Endocrine: Negative for polydipsia, polyphagia and polyuria.  Skin: Negative for rash.  Neurological: Negative for numbness.      Objective:    Nursing note and vital signs reviewed.    Cheyenne Guzman seems to be doing well and is pleasant to speak with.  Symptoms per description sound to be resolved. Assessment & Plan:   Problem List Items Addressed This Visit      Other   Cellulitis of left lower extremity    Cellulitis of the left lower extremity appears resolved with oritavancin and cefazolin treatments.  Discussed importance of maintaining leg elevation and compression to reduce lymphedema and recurrence of cellulitis.  She does have follow-up appointment with lymphedema clinic in the next couple weeks.  No further treatment is necessary at this time.  Plan for follow-up as needed.          I am having Cheyenne Guzman. Mayer maintain her albuterol, methocarbamol, gabapentin, glucose blood, Contour Next Monitor, glucose blood, albuterol, levothyroxine, Buprenorphine, Insulin Glargine, ALPRAZolam, rivaroxaban, AZO-CRANBERRY PO, furosemide, fluconazole, and sertraline.   No orders of the defined types were placed in this encounter.    I discussed the assessment and treatment plan with the patient. The patient was provided an opportunity  to ask questions and all were answered. The patient agreed with the plan and demonstrated an understanding of the instructions.   The patient was advised to call back or seek an in-person evaluation if the symptoms worsen or if the condition fails to improve as anticipated.   I provided 12  minutes of non-face-to-face time during this encounter.  Follow-up: As needed   [name], MSN, FNP-C Nurse Practitioner [facility] [place] number: [phone]

## 2018-07-30 NOTE — ED Triage Notes (Signed)
Pt reports continued left leg cellulitis and fevers at home. Pt admitted twice in the past 3 months for the same. Pt a.o, nad noted

## 2018-07-30 NOTE — Patient Instructions (Signed)
Nice to talk to you today.  No further treatment is necessary at this time.  Recommend continuing to elevate your legs and work on fluid reduction.  Continue appointment with lymphedema clinic.  Best wishes for health in the future and follow-up with infectious disease as needed.  Have a great day and stay safe!

## 2018-07-31 ENCOUNTER — Emergency Department (HOSPITAL_COMMUNITY): Payer: Medicare Other

## 2018-07-31 DIAGNOSIS — I890 Lymphedema, not elsewhere classified: Secondary | ICD-10-CM

## 2018-07-31 DIAGNOSIS — Z6841 Body Mass Index (BMI) 40.0 and over, adult: Secondary | ICD-10-CM

## 2018-07-31 DIAGNOSIS — L03116 Cellulitis of left lower limb: Secondary | ICD-10-CM | POA: Diagnosis not present

## 2018-07-31 DIAGNOSIS — E669 Obesity, unspecified: Secondary | ICD-10-CM

## 2018-07-31 DIAGNOSIS — B9689 Other specified bacterial agents as the cause of diseases classified elsewhere: Secondary | ICD-10-CM

## 2018-07-31 DIAGNOSIS — R7881 Bacteremia: Secondary | ICD-10-CM

## 2018-07-31 DIAGNOSIS — Z87891 Personal history of nicotine dependence: Secondary | ICD-10-CM

## 2018-07-31 DIAGNOSIS — G4733 Obstructive sleep apnea (adult) (pediatric): Secondary | ICD-10-CM

## 2018-07-31 DIAGNOSIS — L03119 Cellulitis of unspecified part of limb: Secondary | ICD-10-CM | POA: Diagnosis present

## 2018-07-31 DIAGNOSIS — E1169 Type 2 diabetes mellitus with other specified complication: Secondary | ICD-10-CM | POA: Diagnosis not present

## 2018-07-31 DIAGNOSIS — I2782 Chronic pulmonary embolism: Secondary | ICD-10-CM

## 2018-07-31 DIAGNOSIS — A419 Sepsis, unspecified organism: Secondary | ICD-10-CM

## 2018-07-31 DIAGNOSIS — Z872 Personal history of diseases of the skin and subcutaneous tissue: Secondary | ICD-10-CM

## 2018-07-31 DIAGNOSIS — F419 Anxiety disorder, unspecified: Secondary | ICD-10-CM

## 2018-07-31 DIAGNOSIS — Z86711 Personal history of pulmonary embolism: Secondary | ICD-10-CM

## 2018-07-31 DIAGNOSIS — A498 Other bacterial infections of unspecified site: Secondary | ICD-10-CM | POA: Diagnosis present

## 2018-07-31 DIAGNOSIS — F329 Major depressive disorder, single episode, unspecified: Secondary | ICD-10-CM

## 2018-07-31 DIAGNOSIS — Z885 Allergy status to narcotic agent status: Secondary | ICD-10-CM

## 2018-07-31 DIAGNOSIS — E1151 Type 2 diabetes mellitus with diabetic peripheral angiopathy without gangrene: Secondary | ICD-10-CM

## 2018-07-31 LAB — PROTIME-INR
INR: 2 — ABNORMAL HIGH (ref 0.8–1.2)
Prothrombin Time: 22.3 seconds — ABNORMAL HIGH (ref 11.4–15.2)

## 2018-07-31 LAB — BLOOD CULTURE ID PANEL (REFLEXED)

## 2018-07-31 LAB — CBC WITH DIFFERENTIAL/PLATELET
Abs Immature Granulocytes: 0.02 10*3/uL (ref 0.00–0.07)
Basophils Absolute: 0 10*3/uL (ref 0.0–0.1)
Basophils Relative: 0 %
Eosinophils Absolute: 0.1 10*3/uL (ref 0.0–0.5)
Eosinophils Relative: 1 %
HCT: 42.8 % (ref 36.0–46.0)
Hemoglobin: 12.7 g/dL (ref 12.0–15.0)
Immature Granulocytes: 0 %
Lymphocytes Relative: 7 %
Lymphs Abs: 0.6 10*3/uL — ABNORMAL LOW (ref 0.7–4.0)
MCH: 23.9 pg — ABNORMAL LOW (ref 26.0–34.0)
MCHC: 29.7 g/dL — ABNORMAL LOW (ref 30.0–36.0)
MCV: 80.6 fL (ref 80.0–100.0)
Monocytes Absolute: 0.5 10*3/uL (ref 0.1–1.0)
Monocytes Relative: 6 %
Neutro Abs: 7.5 10*3/uL (ref 1.7–7.7)
Neutrophils Relative %: 86 %
Platelets: 240 10*3/uL (ref 150–400)
RBC: 5.31 MIL/uL — ABNORMAL HIGH (ref 3.87–5.11)
RDW: 17.4 % — ABNORMAL HIGH (ref 11.5–15.5)
WBC: 8.8 10*3/uL (ref 4.0–10.5)
nRBC: 0 % (ref 0.0–0.2)

## 2018-07-31 LAB — GLUCOSE, CAPILLARY
Glucose-Capillary: 167 mg/dL — ABNORMAL HIGH (ref 70–99)
Glucose-Capillary: 148 mg/dL — ABNORMAL HIGH (ref 70–99)
Glucose-Capillary: 208 mg/dL — ABNORMAL HIGH (ref 70–99)
Glucose-Capillary: 162 mg/dL — ABNORMAL HIGH (ref 70–99)

## 2018-07-31 LAB — CBC
HCT: 40.6 % (ref 36.0–46.0)
Hemoglobin: 11.8 g/dL — ABNORMAL LOW (ref 12.0–15.0)
MCH: 23.6 pg — ABNORMAL LOW (ref 26.0–34.0)
MCHC: 29.1 g/dL — ABNORMAL LOW (ref 30.0–36.0)
MCV: 81.2 fL (ref 80.0–100.0)
Platelets: 220 10*3/uL (ref 150–400)
RBC: 5 MIL/uL (ref 3.87–5.11)
RDW: 17.3 % — ABNORMAL HIGH (ref 11.5–15.5)
WBC: 13.2 10*3/uL — ABNORMAL HIGH (ref 4.0–10.5)
nRBC: 0 % (ref 0.0–0.2)

## 2018-07-31 LAB — COMPREHENSIVE METABOLIC PANEL
ALT: 15 U/L (ref 0–44)
AST: 21 U/L (ref 15–41)
Albumin: 3.7 g/dL (ref 3.5–5.0)
Alkaline Phosphatase: 98 U/L (ref 38–126)
Anion gap: 13 (ref 5–15)
BUN: 16 mg/dL (ref 6–20)
CO2: 24 mmol/L (ref 22–32)
Calcium: 9 mg/dL (ref 8.9–10.3)
Chloride: 100 mmol/L (ref 98–111)
Creatinine, Ser: 0.98 mg/dL (ref 0.44–1.00)
GFR calc Af Amer: >60 mL/min (ref >60)
GFR calc non Af Amer: >60 mL/min (ref >60)
Glucose, Bld: 318 mg/dL — ABNORMAL HIGH (ref 70–99)
Potassium: 4.3 mmol/L (ref 3.5–5.1)
Sodium: 137 mmol/L (ref 135–145)
Total Bilirubin: 0.4 mg/dL (ref 0.3–1.2)
Total Protein: 7.8 g/dL (ref 6.5–8.1)

## 2018-07-31 LAB — BASIC METABOLIC PANEL
Anion gap: 10 (ref 5–15)
BUN: 17 mg/dL (ref 6–20)
CO2: 23 mmol/L (ref 22–32)
Calcium: 8.6 mg/dL — ABNORMAL LOW (ref 8.9–10.3)
Chloride: 102 mmol/L (ref 98–111)
Creatinine, Ser: 1.15 mg/dL — ABNORMAL HIGH (ref 0.44–1.00)
GFR calc Af Amer: >60 mL/min (ref >60)
GFR calc non Af Amer: 54 mL/min — ABNORMAL LOW (ref >60)
Glucose, Bld: 196 mg/dL — ABNORMAL HIGH (ref 70–99)
Potassium: 4.6 mmol/L (ref 3.5–5.1)
Sodium: 135 mmol/L (ref 135–145)

## 2018-07-31 LAB — CBG MONITORING, ED: Glucose-Capillary: 196 mg/dL — ABNORMAL HIGH (ref 70–99)

## 2018-07-31 LAB — SARS CORONAVIRUS 2 BY RT PCR (HOSPITAL ORDER, PERFORMED IN ~~LOC~~ HOSPITAL LAB): SARS Coronavirus 2: NEGATIVE

## 2018-07-31 LAB — LACTIC ACID, PLASMA
Lactic Acid, Venous: 2.4 mmol/L (ref 0.5–1.9)
Lactic Acid, Venous: 1.9 mmol/L (ref 0.5–1.9)

## 2018-07-31 LAB — MRSA PCR SCREENING: MRSA by PCR: NEGATIVE

## 2018-07-31 MED ORDER — METHOCARBAMOL 500 MG PO TABS
750.0000 mg | ORAL_TABLET | Freq: Three times a day (TID) | ORAL | Status: DC
  Administered 2018-07-31 – 2018-08-02 (×8): 750 mg via ORAL
  Filled 2018-07-31 (×8): qty 2

## 2018-07-31 MED ORDER — PIPERACILLIN-TAZOBACTAM 3.375 G IVPB 30 MIN
3.3750 g | Freq: Once | INTRAVENOUS | Status: AC
  Administered 2018-07-31: 3.375 g via INTRAVENOUS
  Filled 2018-07-31: qty 50

## 2018-07-31 MED ORDER — SODIUM CHLORIDE 0.9 % IV SOLN
INTRAVENOUS | Status: DC
  Administered 2018-07-31 (×2): via INTRAVENOUS

## 2018-07-31 MED ORDER — VANCOMYCIN HCL 10 G IV SOLR: 2500.0000 mg | INTRAVENOUS | Status: DC

## 2018-07-31 MED ORDER — BUPRENORPHINE 15 MCG/HR TD PTWK: 1.0000 | MEDICATED_PATCH | TRANSDERMAL | Status: DC

## 2018-07-31 MED ORDER — INSULIN GLARGINE 100 UNIT/ML ~~LOC~~ SOLN
285.0000 [IU] | Freq: Every day | SUBCUTANEOUS | Status: DC
  Administered 2018-07-31: 285 [IU] via SUBCUTANEOUS
  Filled 2018-07-31 (×2): qty 2.85

## 2018-07-31 MED ORDER — SODIUM CHLORIDE 0.9 % IV SOLN
2.0000 g | INTRAVENOUS | Status: DC
  Administered 2018-07-31 – 2018-08-02 (×3): 2 g via INTRAVENOUS
  Filled 2018-07-31: qty 2
  Filled 2018-07-31 (×2): qty 20
  Filled 2018-07-31: qty 2

## 2018-07-31 MED ORDER — ACETAMINOPHEN 650 MG RE SUPP: 650.0000 mg | Freq: Four times a day (QID) | RECTAL | Status: DC | PRN

## 2018-07-31 MED ORDER — INSULIN ASPART 100 UNIT/ML ~~LOC~~ SOLN
0.0000 [IU] | Freq: Three times a day (TID) | SUBCUTANEOUS | Status: DC
  Administered 2018-07-31: 4 [IU] via SUBCUTANEOUS
  Administered 2018-07-31: 3 [IU] via SUBCUTANEOUS
  Administered 2018-07-31: 7 [IU] via SUBCUTANEOUS
  Administered 2018-08-01 (×2): 4 [IU] via SUBCUTANEOUS
  Administered 2018-08-01: 7 [IU] via SUBCUTANEOUS
  Administered 2018-08-02 (×2): 4 [IU] via SUBCUTANEOUS

## 2018-07-31 MED ORDER — ONDANSETRON HCL 4 MG/2ML IJ SOLN
4.0000 mg | Freq: Once | INTRAMUSCULAR | Status: AC
  Administered 2018-07-31: 4 mg via INTRAVENOUS
  Filled 2018-07-31: qty 2

## 2018-07-31 MED ORDER — KETOROLAC TROMETHAMINE 30 MG/ML IJ SOLN: 30.0000 mg | Freq: Four times a day (QID) | INTRAMUSCULAR | Status: DC | PRN

## 2018-07-31 MED ORDER — LEVOTHYROXINE SODIUM 75 MCG PO TABS
75.0000 ug | ORAL_TABLET | Freq: Every day | ORAL | Status: DC
  Administered 2018-07-31 – 2018-08-02 (×3): 75 ug via ORAL
  Filled 2018-07-31 (×3): qty 1

## 2018-07-31 MED ORDER — SERTRALINE HCL 100 MG PO TABS
200.0000 mg | ORAL_TABLET | Freq: Every day | ORAL | Status: DC
  Administered 2018-07-31 – 2018-08-01 (×2): 200 mg via ORAL
  Filled 2018-07-31 (×2): qty 2

## 2018-07-31 MED ORDER — VANCOMYCIN HCL 10 G IV SOLR
2500.0000 mg | Freq: Once | INTRAVENOUS | Status: AC
  Administered 2018-07-31: 2500 mg via INTRAVENOUS
  Filled 2018-07-31: qty 2500

## 2018-07-31 MED ORDER — RIVAROXABAN 20 MG PO TABS
20.0000 mg | ORAL_TABLET | Freq: Every day | ORAL | Status: DC
  Administered 2018-07-31 – 2018-08-02 (×3): 20 mg via ORAL
  Filled 2018-07-31 (×3): qty 1

## 2018-07-31 MED ORDER — MORPHINE SULFATE (PF) 4 MG/ML IV SOLN
4.0000 mg | Freq: Once | INTRAVENOUS | Status: AC
  Administered 2018-07-31: 4 mg via INTRAVENOUS
  Filled 2018-07-31: qty 1

## 2018-07-31 MED ORDER — ACETAMINOPHEN 325 MG PO TABS
650.0000 mg | ORAL_TABLET | Freq: Four times a day (QID) | ORAL | Status: DC | PRN
  Administered 2018-07-31 – 2018-08-01 (×4): 650 mg via ORAL
  Filled 2018-07-31 (×4): qty 2

## 2018-07-31 MED ORDER — ONDANSETRON HCL 4 MG PO TABS: 4.0000 mg | ORAL_TABLET | Freq: Four times a day (QID) | ORAL | Status: DC | PRN

## 2018-07-31 MED ORDER — ALPRAZOLAM 0.5 MG PO TABS
1.0000 mg | ORAL_TABLET | Freq: Three times a day (TID) | ORAL | Status: DC
  Administered 2018-07-31 – 2018-08-02 (×5): 1 mg via ORAL
  Filled 2018-07-31 (×6): qty 2

## 2018-07-31 MED ORDER — CEFAZOLIN SODIUM-DEXTROSE 2-4 GM/100ML-% IV SOLN
2.0000 g | Freq: Three times a day (TID) | INTRAVENOUS | Status: DC
  Administered 2018-07-31: 2 g via INTRAVENOUS
  Filled 2018-07-31 (×2): qty 100

## 2018-07-31 MED ORDER — ALBUTEROL SULFATE HFA 108 (90 BASE) MCG/ACT IN AERS: 2.0000 | INHALATION_SPRAY | RESPIRATORY_TRACT | Status: DC | PRN

## 2018-07-31 MED ORDER — ONDANSETRON HCL 4 MG/2ML IJ SOLN: 4.0000 mg | Freq: Four times a day (QID) | INTRAMUSCULAR | Status: DC | PRN

## 2018-07-31 MED ORDER — ACETAMINOPHEN 500 MG PO TABS
1000.0000 mg | ORAL_TABLET | Freq: Once | ORAL | Status: AC
  Administered 2018-07-31: 1000 mg via ORAL
  Filled 2018-07-31: qty 2

## 2018-07-31 MED ORDER — ALBUTEROL SULFATE (2.5 MG/3ML) 0.083% IN NEBU: 2.5000 mg | INHALATION_SOLUTION | RESPIRATORY_TRACT | Status: DC | PRN

## 2018-07-31 MED ORDER — PIPERACILLIN-TAZOBACTAM 3.375 G IVPB
3.3750 g | Freq: Three times a day (TID) | INTRAVENOUS | Status: DC
  Administered 2018-07-31: 3.375 g via INTRAVENOUS
  Filled 2018-07-31: qty 50

## 2018-07-31 MED ORDER — GABAPENTIN 300 MG PO CAPS
300.0000 mg | ORAL_CAPSULE | Freq: Two times a day (BID) | ORAL | Status: DC
  Administered 2018-07-31 – 2018-08-02 (×6): 300 mg via ORAL
  Filled 2018-07-31 (×6): qty 1

## 2018-07-31 NOTE — Telephone Encounter (Signed)
Dr. Fry please advise. Thanks  

## 2018-07-31 NOTE — Plan of Care (Signed)
  Problem: Nutrition: Goal: Adequate nutrition will be maintained Outcome: Completed/Met

## 2018-07-31 NOTE — H&P (Signed)
History and Physical    Cheyenne Guzman:[id number] DOB: 14-Jun-1965 DOA: 07/30/2018  PCP: Laurey Morale, MD  Patient coming from: Home  I have personally briefly reviewed patient's old medical records in [place]  Chief Complaint: L leg cellulitis recurrence  HPI: Cheyenne Guzman is a 53 y.o. female with medical history significant of DM2, PE on xarelto, HLD, OSA, morbid obesity, chronic pain.  Patient presents to the ED for recurrent cellulitis of LLE.  She was admitted in May and June for the same.  Cellulitis each time was associated with systemic sepsis symptoms, fever, tachycardia, etc.  She had been doing better, patient states she had an ED visit with infectious disease yesterday afternoon and seemed like her leg was doing okay.  States later this evening around 10 PM she started developing a lot of pain in the posterior aspect of her left calf.  States she got some stinging sensations and also some soreness in her left groin.  States this is consistent with her prior episodes of cellulitis.  States usually this is what happens, rapid onset and exponential worsening.  Associated chills, nausea.  Did take single dose of amoxicillin this afternoon (script she was given to take in case of worsening).  DVT study last admit was negative.   ED Course: Tm 103.  Tachy to 120.  Given tylenol for fever.  Started on empiric zosyn and vanc for cellulitis with sepsis.   Review of Systems: As per HPI otherwise 10 point review of systems negative.   Past Medical History:  Diagnosis Date  . Anxiety    per pt. - mild   . Arthritis    back & ankles   . Asthma    seasonal   . Cancer [facility])    breast, sees Dr. Jana Hakim , Left - 11/2007  . Complication of anesthesia 2013   severe muscle spasms-sore-no doc  . Depression   . Diabetes mellitus    sees Dr. Chalmers Cater, diagnosed 2003  . HA (headache)   . Hearing loss    Went to [facility]  .  Hyperlipidemia   . Kidney infection    - hosp. [facility]- septic- 10/2009  . Low back pain   . Peripheral neuropathy    R sided numbness- face & jaw  . Sleep apnea 9/15   severe-just started cpap-doing well  . TIA (transient ischemic attack)    in 2007,2008, and 2011  . Tinnitus of right ear   . Urosepsis 08-2009   with Klebsiella    Past Surgical History:  Procedure Laterality Date  . ABDOMINAL HYSTERECTOMY  08/2004  . BREAST BIOPSY  06/22/2011   Procedure: BREAST BIOPSY;  Surgeon: Stark Klein, MD;  Location: [facility];  Service: General;  Laterality: Right;  . BREAST SURGERY  11/2007   left ductal carcinoma in situ  . CARPAL TUNNEL RELEASE Right 10/13/2013   Procedure: RIGHT CARPAL TUNNEL RELEASE;  Surgeon: Hessie Dibble, MD;  Location: [place];  Service: Orthopedics;  Laterality: Right;  . CARPAL TUNNEL RELEASE Left 11/24/2013   Procedure: LEFT CARPAL TUNNEL RELEASE WITH CYST EXCISION ;  Surgeon: Hessie Dibble, MD;  Location: [facility];  Service: Orthopedics;  Laterality: Left;  . EAR CYST EXCISION Left 11/24/2013   Procedure: CYST REMOVAL;  Surgeon: Hessie Dibble, MD;  Location: [place];  Service: Orthopedics;  Laterality: Left;  . FOOT SURGERY  2000  plantar fasciitis-left  . LUMBAR DISC SURGERY  03-29-14   Fusion, revision 04-27-14 L4-5 Cauda Equina with graft  . NERVE GRAFT  06-01-08   cadaver graft to right inferior alveolar nerve  in [place]     reports that she quit smoking about 21 years ago. She has never used smokeless tobacco. She reports current alcohol use. She reports that she does not use drugs.  Allergies  Allergen Reactions  . Vicodin [Hydrocodone-Acetaminophen] Nausea And Vomiting    Family History  Problem Relation Age of Onset  . Cancer Mother        breast  . Cancer Father        lung  . Diabetes Father   . Cancer Sister        colon  . Cancer Maternal Aunt        breast - both  . Diabetes  Sister   . Anesthesia problems Neg Hx      Prior to Admission medications   Medication Sig Start Date End Date Taking? Authorizing Provider  albuterol (PROVENTIL HFA;VENTOLIN HFA) 108 (90 Base) MCG/ACT inhaler Inhale 2 puffs into the lungs every 4 (four) hours as needed for wheezing or shortness of breath. 04/03/17   Laurey Morale, MD  albuterol (PROVENTIL) (2.5 MG/3ML) 0.083% nebulizer solution 1 VIAL IN NEBULIZER EVERY 4 HOURS AS NEEDED FOR WHEEZING Patient taking differently: Take 2.5 mg by nebulization every 4 (four) hours as needed for wheezing.  10/13/13   Laurey Morale, MD  ALPRAZolam Duanne Moron) 1 MG tablet TAKE 1 TABLET BY MOUTH THREE TIMES A DAY Patient taking differently: Take 1 mg by mouth 3 (three) times daily.  03/06/18   Laurey Morale, MD  AZO-CRANBERRY PO Take 2 tablets by mouth daily.    [provider]  Blood Glucose Monitoring Suppl (CONTOUR NEXT MONITOR) w/Device KIT 1 each by Does not apply route 2 (two) times daily. To check blood sugars twice a day. 08/27/16   Renato Shin, MD  Buprenorphine 15 MCG/HR PTWK Place 1 patch onto the skin every Saturday.  07/23/17   [provider]  fluconazole (DIFLUCAN) 150 MG tablet Take 1 tablet (150 mg total) by mouth every three (3) days as needed (for yeast infection). 07/18/18   Rai, Vernelle Emerald, MD  furosemide (LASIX) 20 MG tablet Take 1 tablet (20 mg total) by mouth daily. Take extra 45m as needed if swelling worse. 07/15/18   Rai, RVernelle Emerald MD  gabapentin (NEURONTIN) 300 MG capsule Take 300 mg by mouth 2 (two) times daily.     [provider]  glucose blood (CONTOUR NEXT TEST) test strip Use to test blood sugar 2 times daily 12/18/16   ERenato Shin MD  glucose blood test strip Use as instructed 08/24/16   ERenato Shin MD  Insulin Glargine (LANTUS SOLOSTAR) 100 UNIT/ML Solostar Pen Inject 280 Units into the skin every morning. and pen needles 3/day Patient taking differently: Inject 285 Units into the skin daily.  and pen needles 3/day 10/16/17   ERenato Shin MD  levothyroxine (SYNTHROID, LEVOTHROID) 75 MCG tablet Take 1 tablet (75 mcg total) by mouth daily. 05/28/17   FLaurey Morale MD  methocarbamol (ROBAXIN) 750 MG tablet Take 1 tablet (750 mg total) by mouth 2 (two) times daily. Patient taking differently: Take 750 mg by mouth 3 (three) times daily.  02/03/15   FLaurey Morale MD  rivaroxaban (XARELTO) 20 MG TABS tablet Take 20 mg by mouth daily. 05/22/18  [provider]  sertraline (ZOLOFT) 100 MG tablet TAKE 2 TABLETS (200 MG TOTAL) BY MOUTH AT BEDTIME. 07/22/18   Laurey Morale, MD    Physical Exam: Vitals:   07/30/18 2346 07/31/18 0201 07/31/18 0202 07/31/18 0209  BP: (!) 142/75 (!) 133/96    Pulse: (!) 116 (!) 122    Resp:  (!) 23    Temp: (!) 100.5 F (38.1 C)  (!) 103 F (39.4 C)   TempSrc: Oral  Oral   SpO2: 95% 92%    Weight:    (!) 181.4 kg  Height:    '5\' 7"'  (1.702 m)    Constitutional: NAD, calm, comfortable Eyes: PERRL, lids and conjunctivae normal ENMT: Mucous membranes are moist. Posterior pharynx clear of any exudate or lesions.Normal dentition.  Neck: normal, supple, no masses, no thyromegaly Respiratory: clear to auscultation bilaterally, no wheezing, no crackles. Normal respiratory effort. No accessory muscle use.  Cardiovascular: Regular rate and rhythm, no murmurs / rubs / gallops. No extremity edema. 2+ pedal pulses. No carotid bruits.  Abdomen: no tenderness, no masses palpated. No hepatosplenomegaly. Bowel sounds positive.  Musculoskeletal: no clubbing / cyanosis. No joint deformity upper and lower extremities. Good ROM, no contractures. Normal muscle tone.  Skin: LLE lower leg and foot with erythema, warmth, TTP.  Some areas of medial ankle that appear to have blistered and ruptured with mild skin peeling.  No subq emphysema appreciated, no pain out of proportion to cellulitis or beyond site of cellulitis. Neurologic: CN 2-12 grossly intact. Sensation intact,  DTR normal. Strength 5/5 in all 4.  Psychiatric: Normal judgment and insight. Alert and oriented x 3. Normal mood.    Labs on Admission: I have personally reviewed following labs and imaging studies  CBC: Recent Labs  Lab 07/30/18 2326  WBC 8.8  NEUTROABS 7.5  HGB 12.7  HCT 42.8  MCV 80.6  PLT 993   Basic Metabolic Panel: Recent Labs  Lab 07/30/18 2326  NA 137  K 4.3  CL 100  CO2 24  GLUCOSE 318*  BUN 16  CREATININE 0.98  CALCIUM 9.0   GFR: Estimated Creatinine Clearance: 114.8 mL/min (by C-G formula based on SCr of 0.98 mg/dL). Liver Function Tests: Recent Labs  Lab 07/30/18 2326  AST 21  ALT 15  ALKPHOS 98  BILITOT 0.4  PROT 7.8  ALBUMIN 3.7   No results for input(s): LIPASE, AMYLASE in the last 168 hours. No results for input(s): AMMONIA in the last 168 hours. Coagulation Profile: Recent Labs  Lab 07/30/18 2326  INR 2.0*   Cardiac Enzymes: No results for input(s): CKTOTAL, CKMB, CKMBINDEX, TROPONINI in the last 168 hours. BNP (last 3 results) No results for input(s): PROBNP in the last 8760 hours. HbA1C: No results for input(s): HGBA1C in the last 72 hours. CBG: Recent Labs  Lab 07/31/18 0344  GLUCAP 196*   Lipid Profile: No results for input(s): CHOL, HDL, LDLCALC, TRIG, CHOLHDL, LDLDIRECT in the last 72 hours. Thyroid Function Tests: No results for input(s): TSH, T4TOTAL, FREET4, T3FREE, THYROIDAB in the last 72 hours. Anemia Panel: No results for input(s): VITAMINB12, FOLATE, FERRITIN, TIBC, IRON, RETICCTPCT in the last 72 hours. Urine analysis:    Component Value Date/Time   COLORURINE YELLOW 06/19/2018 1915   APPEARANCEUR HAZY (A) 06/19/2018 1915   APPEARANCEUR Clear 07/31/2013 1404   LABSPEC 1.020 06/19/2018 1915   PHURINE 7.0 06/19/2018 1915   GLUCOSEU NEGATIVE 06/19/2018 [place] 06/19/2018 1915   HGBUR negative 09/28/2009  [place] 06/19/2018 1915   BILIRUBINUR 1+ 10/31/2017 1721   BILIRUBINUR  Negative 07/31/2013 [place] 06/19/2018 1915   PROTEINUR NEGATIVE 06/19/2018 1915   UROBILINOGEN 0.2 10/31/2017 1721   UROBILINOGEN 0.2 07/03/2012 1027   NITRITE NEGATIVE 06/19/2018 1915   LEUKOCYTESUR NEGATIVE 06/19/2018 1915    Radiological Exams on Admission: Dg Tibia/fibula Left Port  Result Date: 07/31/2018 CLINICAL DATA:  Cellulitis EXAM: PORTABLE LEFT TIBIA AND FIBULA - 2 VIEW COMPARISON:  None. FINDINGS: There is no acute displaced fracture or dislocation. There is nonspecific soft tissue swelling about the lower extremity. There are advanced degenerative changes of the knee. There is no radiopaque foreign body. No definite subcutaneous gas. There is a moderate-sized plantar calcaneal spur. IMPRESSION: 1. No acute osseous abnormality. 2. Nonspecific soft tissue swelling about the lower extremity which can be seen in patients with cellulitis. Electronically Signed   By: Constance Holster M.D.   On: 07/31/2018 03:47    EKG: Independently reviewed.  Assessment/Plan Principal Problem:   Cellulitis of left lower extremity Active Problems:   Obstructive sleep apnea   Pulmonary embolism (HCC)   Diabetes mellitus type 2 in obese (HCC)   Sepsis ([place])   Morbid obesity with body mass index of 60.0-69.9 in adult [facility])   Cellulitis of left leg    1. LLE Cellulitis with sepsis - 1. Empiric zosyn / vanc as per cellulitis pathway given systemic findings, lactate 2.4, blistering on ankle, rapid progression of cellulitis. 2. Wound and blood cultures 3. MRSA PCR nares 4. ID consult in AM 5. X ray of LLE - though apparently always gets sepsis and rapid onset of cellulitis as demonstrated last 2 admits. 6. Tylenol PRN fever 7. Toradol PRN pain 8. Hold diuretics and gentle hydration with NS at 125cc/hr 9. Lactate 2.4 initially, repeat 1.9 10. Tele monitor for tachycardia 11. Repeat CBC/BMP in AM 2. OSA - CPAP QHs 3. H/o PE - cont xarelto 4. DM2 - 1. Cont home Lantus 285  units (verified dose with patient and chart that they gave this to her last admission as well) 2. Resistant scale SSI AC 5. Chronic pain - 1. Continue buprenorphine patch 2. Continue neurontin 3. Continue robaxin  DVT prophylaxis: Xarelto Code Status: Full Family Communication: No family in room Disposition Plan: Home after admit Consults called: None, Call ID in AM Admission status: Placing in obs for the moment - though Id suspect she qualifies as inpatient given sepsis and failed outpt ABx (and failed IP antibiotics too!)    [place], [place] [facility]  How to contact the [facility] Attending or Consulting provider [place] or covering provider during after hours [place], for this patient?  1. Check the care team in [facility] and look for a) attending/consulting [facility] provider listed and b) the [facility] team listed 2. Log into [email or web address]  Amion Physician Scheduling and messaging for groups and whole hospitals  On call and physician scheduling software for group practices, residents, hospitalists and other medical providers for call, clinic, rotation and shift schedules. On[employer] is a hospital-wide system for scheduling doctors and paging doctors on call. EasyPlot is for scientific plotting and data analysis.  [email or web address]  and use [place]'s universal password to access. If you do not have the password, please contact the hospital operator.  3. Locate the [facility] provider you are looking for under [facility] and page to a number that you can be directly reached. 4. If  you still have difficulty reaching the provider, please page the [facility] (Director on Call) for the Hospitalists listed on amion for assistance.  07/31/2018, 3:53 AM

## 2018-07-31 NOTE — Progress Notes (Signed)
I have seen and assessed Cheyenne Guzman and agree with Dr. Juleen China assessment and plan.  Cheyenne Guzman is a 53 year old morbidly obese female with a history of type 2 diabetes, history of PE on chronic Xarelto, hyperlipidemia, obstructive sleep apnea, chronic pain, probable lymphedema presented to the ED with recurrent left lower extremity cellulitis.  Cheyenne Guzman has been admitted twice over the past 48-month once in May and once in June with recurrent left lower extremity cellulitis with associated systemic sepsis symptoms of fever and tachycardia.  Cheyenne Guzman admitted placed empirically on IV Zosyn and IV vancomycin.  Blood cultures obtained and are pending.  Plain films of the left lower extremity consistent with cellulitis and negative for any abscesses.  Cheyenne Guzman endorses compliance with medications and states cellulitis develops rather rapidly.  Due to the recurrent nature of Cheyenne Guzman's left lower extremity cellulitis right after recent full course of antibiotic treatment will consult with ID for further evaluation and recommendations.  No charge.

## 2018-07-31 NOTE — ED Provider Notes (Signed)
[facility] EMERGENCY DEPARTMENT Provider Note   CSN: [id number] Arrival date & time: 07/30/18  2234     History   Chief Complaint Chief Complaint  Patient presents with  . Fever  . Cellulitis    HPI Cheyenne Guzman is a 53 y.o. female.     The history is provided by the patient and medical records.     53 year old female with history of anxiety, arthritis, asthma, hyperlipidemia, peripheral neuropathy, sleep apnea, presenting to the ED for recurrent cellulitis of left lower leg.  Patient states she had an ED visit with infectious disease yesterday afternoon and seemed like her leg was doing okay.  States later this evening around 10 PM she started developing a lot of pain in the posterior aspect of her left calf.  States she got some stinging sensations and also some soreness in her left groin.  States this is consistent with her prior episodes of cellulitis.  States usually this is what happens, rapid onset and exponential worsening.  She reports fever at home, up to 103 here.  She reports some chills and sweats as well as nausea.  When she was last discharged from the hospital on 07/15/2018 she was given a prescription for amoxicillin to start if symptoms worsening which she did take this afternoon.  States redness has continued spreading rapidly.  He is anticoagulated with Xarelto.  DVT study during last admission was negative.  Past Medical History:  Diagnosis Date  . Anxiety    per pt. - mild   . Arthritis    back & ankles   . Asthma    seasonal   . Cancer [facility])    breast, sees Dr. Jana Hakim , Left - 11/2007  . Complication of anesthesia 2013   severe muscle spasms-sore-no doc  . Depression   . Diabetes mellitus    sees Dr. Chalmers Cater, diagnosed 2003  . HA (headache)   . Hearing loss    Went to [facility]  . Hyperlipidemia   . Kidney infection    - hosp. [facility]- septic- 10/2009  . Low back pain   . Peripheral  neuropathy    R sided numbness- face & jaw  . Sleep apnea 9/15   severe-just started cpap-doing well  . TIA (transient ischemic attack)    in 2007,2008, and 2011  . Tinnitus of right ear   . Urosepsis 08-2009   with Klebsiella    Patient Active Problem List   Diagnosis Date Noted  . Cellulitis 07/09/2018  . Thigh pain 07/09/2018  . Morbid obesity with body mass index of 60.0-69.9 in adult [facility])   . Sepsis ([place]) 06/19/2018  . Chronic pain 06/19/2018  . Pulmonary embolism ([place]) 03/12/2018  . Diabetes mellitus type 2 in obese ([place]) 03/12/2018  . Acute pulmonary embolism ([place]) 03/12/2018  . Acute viral syndrome 09/05/2017  . Hypothyroidism 08/14/2017  . Edema of both legs 10/18/2015  . Migraine 06/16/2014  . Lumbar radiculopathy 03/18/2014  . Diabetic neuropathy ([place]) 01/07/2014  . Congenital spondylolysis of lumbosacral region 11/02/2013  . SPL (spondylolisthesis) 10/08/2013  . Chronic LBP 10/08/2013  . Obstructive sleep apnea 07/31/2013  . HA (headache)   . Tinnitus 06/23/2013  . Hearing loss 06/23/2013  . Headache(784.0) 06/23/2013  . Low back pain 06/10/2013  . SOB (shortness of breath) 11/27/2012  . Cellulitis of left lower extremity 07/03/2012  . DCIS (ductal carcinoma in situ) of breast, left, treated with [facility] 11/2007 02/19/2011  .  BACK PAIN, LUMBAR 10/13/2009  . Asthma 02/20/2008  . Hyperlipidemia associated with type 2 diabetes mellitus ([place]) 11/26/2006  . Anxiety state 11/26/2006  . Anxiety and depression 11/26/2006    Past Surgical History:  Procedure Laterality Date  . ABDOMINAL HYSTERECTOMY  08/2004  . BREAST BIOPSY  06/22/2011   Procedure: BREAST BIOPSY;  Surgeon: Stark Klein, MD;  Location: [facility];  Service: General;  Laterality: Right;  . BREAST SURGERY  11/2007   left ductal carcinoma in situ  . CARPAL TUNNEL RELEASE Right 10/13/2013   Procedure: RIGHT CARPAL TUNNEL RELEASE;  Surgeon: Hessie Dibble, MD;  Location: [place];  Service:  Orthopedics;  Laterality: Right;  . CARPAL TUNNEL RELEASE Left 11/24/2013   Procedure: LEFT CARPAL TUNNEL RELEASE WITH CYST EXCISION ;  Surgeon: Hessie Dibble, MD;  Location: [place];  Service: Orthopedics;  Laterality: Left;  . EAR CYST EXCISION Left 11/24/2013   Procedure: CYST REMOVAL;  Surgeon: Hessie Dibble, MD;  Location: [place];  Service: Orthopedics;  Laterality: Left;  . FOOT SURGERY  2000   plantar fasciitis-left  . LUMBAR DISC SURGERY  03-29-14   Fusion, revision 04-27-14 L4-5 C[place]a Equina with graft  . NERVE GRAFT  06-01-08   cadaver graft to right inferior alveolar nerve  in [place]     OB History   No obstetric history on file.      Home Medications    Prior to Admission medications   Medication Sig Start Date End Date Taking? Authorizing Provider  albuterol (PROVENTIL HFA;VENTOLIN HFA) 108 (90 Base) MCG/ACT inhaler Inhale 2 puffs into the lungs every 4 (four) hours as needed for wheezing or shortness of breath. 04/03/17   Laurey Morale, MD  albuterol (PROVENTIL) (2.5 MG/3ML) 0.083% nebulizer solution 1 VIAL IN NEBULIZER EVERY 4 HOURS AS NEEDED FOR WHEEZING Patient taking differently: Take 2.5 mg by nebulization every 4 (four) hours as needed for wheezing.  10/13/13   Laurey Morale, MD  ALPRAZolam Duanne Moron) 1 MG tablet TAKE 1 TABLET BY MOUTH THREE TIMES A DAY Patient taking differently: Take 1 mg by mouth 3 (three) times daily.  03/06/18   Laurey Morale, MD  AZO-CRANBERRY PO Take 2 tablets by mouth daily.    [provider]  Blood Glucose Monitoring Suppl (CONTOUR NEXT MONITOR) w/Device KIT 1 each by Does not apply route 2 (two) times daily. To check blood sugars twice a day. 08/27/16   Renato Shin, MD  Buprenorphine 15 MCG/HR PTWK Place 1 patch onto the skin every Saturday.  07/23/17   [provider]  fluconazole (DIFLUCAN) 150 MG tablet Take 1 tablet (150 mg total) by mouth every three (3) days as needed (for yeast  infection). 07/18/18   Rai, Vernelle Emerald, MD  furosemide (LASIX) 20 MG tablet Take 1 tablet (20 mg total) by mouth daily. Take extra 80m as needed if swelling worse. 07/15/18   Rai, RVernelle Emerald MD  gabapentin (NEURONTIN) 300 MG capsule Take 300 mg by mouth 2 (two) times daily.     [provider]  glucose blood (CONTOUR NEXT TEST) test strip Use to test blood sugar 2 times daily 12/18/16   ERenato Shin MD  glucose blood test strip Use as instructed 08/24/16   ERenato Shin MD  Insulin Glargine (LANTUS SOLOSTAR) 100 UNIT/ML Solostar Pen Inject 280 Units into the skin every morning. and pen needles 3/day Patient taking differently: Inject 285 Units into the skin daily.  and pen needles 3/day 10/16/17   Renato Shin, MD  levothyroxine (SYNTHROID, LEVOTHROID) 75 MCG tablet Take 1 tablet (75 mcg total) by mouth daily. 05/28/17   Laurey Morale, MD  methocarbamol (ROBAXIN) 750 MG tablet Take 1 tablet (750 mg total) by mouth 2 (two) times daily. Patient taking differently: Take 750 mg by mouth 3 (three) times daily.  02/03/15   Laurey Morale, MD  rivaroxaban (XARELTO) 20 MG TABS tablet Take 20 mg by mouth daily. 05/22/18   [provider]  sertraline (ZOLOFT) 100 MG tablet TAKE 2 TABLETS (200 MG TOTAL) BY MOUTH AT BEDTIME. 07/22/18   Laurey Morale, MD    Family History Family History  Problem Relation Age of Onset  . Cancer Mother        breast  . Cancer Father        lung  . Diabetes Father   . Cancer Sister        colon  . Cancer Maternal Aunt        breast - both  . Diabetes Sister   . Anesthesia problems Neg Hx     Social History Social History   Tobacco Use  . Smoking status: Former Smoker    Quit date: 02/18/1997    Years since quitting: 21.4  . Smokeless tobacco: Never Used  Substance Use Topics  . Alcohol use: Yes    Comment: rare  . Drug use: No     Allergies   Vicodin [hydrocodone-acetaminophen]   Review of Systems Review of Systems  Skin: Positive for  color change.  All other systems reviewed and are negative.    Physical Exam Updated Vital Signs BP (!) 133/96   Pulse (!) 122   Temp (!) 103 F (39.4 C) (Oral)   Resp (!) 23   SpO2 92%   Physical Exam Vitals signs and nursing note reviewed.  Constitutional:      Appearance: She is well-developed.     Comments: Morbidly obese  HENT:     Head: Normocephalic and atraumatic.  Eyes:     Conjunctiva/sclera: Conjunctivae normal.     Pupils: Pupils are equal, round, and reactive to light.  Neck:     Musculoskeletal: Normal range of motion.  Cardiovascular:     Rate and Rhythm: Normal rate and regular rhythm.     Heart sounds: Normal heart sounds.  Pulmonary:     Effort: Pulmonary effort is normal.     Breath sounds: Normal breath sounds.  Abdominal:     General: Bowel sounds are normal.     Palpations: Abdomen is soft.  Musculoskeletal: Normal range of motion.     Comments: Left lower leg and foot with erythema, warmth to touch, and tenderness, there are some areas along the left medial ankle and foot that seems to have blistered and ruptured, skin seems to be peeling, DP pulse intact No tissue crepitus noted, compartments soft  Skin:    General: Skin is warm and dry.  Neurological:     Mental Status: She is alert and oriented to person, place, and time.      ED Treatments / Results  Labs (all labs ordered are listed, but only abnormal results are displayed) Labs Reviewed  COMPREHENSIVE METABOLIC PANEL - Abnormal; Notable for the following components:      Result Value   Glucose, Bld 318 (*)    All other components within normal limits  LACTIC ACID, PLASMA - Abnormal; Notable for the following components:  Lactic Acid, Venous 2.4 (*)    All other components within normal limits  CBC WITH DIFFERENTIAL/PLATELET - Abnormal; Notable for the following components:   RBC 5.31 (*)    MCH 23.9 (*)    MCHC 29.7 (*)    RDW 17.4 (*)    Lymphs Abs 0.6 (*)    All other  components within normal limits  PROTIME-INR - Abnormal; Notable for the following components:   Prothrombin Time 22.3 (*)    INR 2.0 (*)    All other components within normal limits  CBG MONITORING, ED - Abnormal; Notable for the following components:   Glucose-Capillary 196 (*)    All other components within normal limits  CULTURE, BLOOD (ROUTINE X 2)  CULTURE, BLOOD (ROUTINE X 2)  SARS CORONAVIRUS 2 (HOSPITAL ORDER, [place] LAB)  MRSA PCR SCREENING  AEROBIC CULTURE (SUPERFICIAL SPECIMEN)  LACTIC ACID, PLASMA    EKG None  Radiology Dg Tibia/fibula Left Port  Result Date: 07/31/2018 CLINICAL DATA:  Cellulitis EXAM: PORTABLE LEFT TIBIA AND FIBULA - 2 VIEW COMPARISON:  None. FINDINGS: There is no acute displaced fracture or dislocation. There is nonspecific soft tissue swelling about the lower extremity. There are advanced degenerative changes of the knee. There is no radiopaque foreign body. No definite subcutaneous gas. There is a moderate-sized plantar calcaneal spur. IMPRESSION: 1. No acute osseous abnormality. 2. Nonspecific soft tissue swelling about the lower extremity which can be seen in patients with cellulitis. Electronically Signed   By: Constance Holster M.D.   On: 07/31/2018 03:47    Procedures Procedures (including critical care time)  Medications Ordered in ED Medications  sodium chloride flush (NS) 0.9 % injection 3 mL (has no administration in time range)  acetaminophen (TYLENOL) tablet 1,000 mg (has no administration in time range)  vancomycin (VANCOCIN) 2,500 mg in sodium chloride 0.9 % 500 mL IVPB (has no administration in time range)  piperacillin-tazobactam (ZOSYN) IVPB 3.375 g (has no administration in time range)  albuterol (PROVENTIL) (2.5 MG/3ML) 0.083% nebulizer solution 2.5 mg (has no administration in time range)  ALPRAZolam (XANAX) tablet 1 mg (has no administration in time range)  Buprenorphine PTWK 1 patch (has no  administration in time range)  gabapentin (NEURONTIN) capsule 300 mg (has no administration in time range)  levothyroxine (SYNTHROID) tablet 75 mcg (has no administration in time range)  methocarbamol (ROBAXIN) tablet 750 mg (has no administration in time range)  rivaroxaban (XARELTO) tablet 20 mg (has no administration in time range)  sertraline (ZOLOFT) tablet 200 mg (has no administration in time range)  insulin aspart (novoLOG) injection 0-20 Units (has no administration in time range)  vancomycin (VANCOCIN) 2,500 mg in sodium chloride 0.9 % 500 mL IVPB (has no administration in time range)  piperacillin-tazobactam (ZOSYN) IVPB 3.375 g (has no administration in time range)  Insulin Glargine (LANTUS) Solostar Pen 285 Units (has no administration in time range)  acetaminophen (TYLENOL) tablet 650 mg (has no administration in time range)    Or  acetaminophen (TYLENOL) suppository 650 mg (has no administration in time range)  ondansetron (ZOFRAN) tablet 4 mg (has no administration in time range)    Or  ondansetron (ZOFRAN) injection 4 mg (has no administration in time range)  ketorolac (TORADOL) 30 MG/ML injection 30 mg (has no administration in time range)  0.9 %  sodium chloride infusion (has no administration in time range)  ondansetron (ZOFRAN) injection 4 mg (4 mg Intravenous Given 07/31/18 0345)  morphine 4 MG/ML  injection 4 mg (4 mg Intravenous Given 07/31/18 0345)     Initial Impression / Assessment and Plan / ED Course  I have reviewed the triage vital signs and the nursing notes.  Pertinent labs & imaging results that were available during my care of the patient were reviewed by me and considered in my medical decision making (see chart for details).  53 y.o. F here with recurrent cellulitis of LLE.  Recent admission last month for same.  States last episode started from scratch on her leg, unsure what set off this episode.  Temp here is up to 103F, tachycardia as well which is  likely due to fever.  No cough or other URI symptoms.  LLE appears erythematous, warm to the touch, and tender, especially along the posterior calf.  She does have some areas of the left ankle and foot that seem to have blistered and skin is now feeling-- questionable source?  Patient labs with lactate of 2.4, normal WBC count.  She will require repeat admission with IV abx.  Started vanc/zosyn as she does have hx of DM so will cover for atypicals.  Discussed with Dr. Alcario Drought-- will admit for ongoing care.  COVID screen pending.  Final Clinical Impressions(s) / ED Diagnoses   Final diagnoses:  Cellulitis of left lower extremity    ED Discharge Orders    None       Larene Pickett, PA-C 07/31/18 Pooler, Ankit, MD 07/31/18 [phone]

## 2018-07-31 NOTE — Consult Note (Addendum)
[facility]    Date of Admission:  07/30/2018    Oritavancin dosed 07/14/2018        Day 1 vancomycin        Day 1 piperacillin tazobactam       Reason for Consult: Recurrent lower extremity cellulitis    Referring Provider: Dr. Irine Seal  Assessment: She has another recurrence of her left leg cellulitis most likely due to strep bacteria.  I will narrow therapy to cefazolin.  To her at length about the need to continue to work on addressing her dry skin with moisturizers and less frequent bathing with soap.  She will also need to retry compression stockings and work with the lymphedema clinic.  Weight loss would certainly help but will be difficult.  Given the severity and frequency of her recurrences I would consider giving her IM benzathine penicillin on a regular basis for several months to see if we can prevent recurrences while addressing these other risk factors.  Addendum: This afternoon admission blood cultures were reported to be growing Serratia, and unexpected finding.  We have changed cefazolin to ceftriaxone for dual coverage of her recurrent cellulitis and her bacteremia.  Plan: 1. Change antibiotic therapy to IV ceftriaxone  Principal Problem:   Recurrent cellulitis of lower extremity Active Problems:   Cellulitis of left lower extremity   Sepsis ([place])   Serratia infection   Hyperlipidemia associated with type 2 diabetes mellitus (HCC)   Anxiety and depression   Asthma   DCIS (ductal carcinoma in situ) of breast, left, treated with BCT 11/2007   Obstructive sleep apnea   Diabetic neuropathy ([place])   Lymphedema of both lower extremities   Pulmonary embolism ([place])   Diabetes mellitus type 2 in obese (HCC)   Chronic pain   Morbid obesity with body mass index of 60.0-69.9 in adult [facility])   Scheduled Meds: . ALPRAZolam  1 mg Oral TID  . [START ON 08/02/2018] Buprenorphine  1 patch Transdermal Q Sat  . gabapentin  300 mg Oral  BID  . insulin aspart  0-20 Units Subcutaneous TID WC  . insulin glargine  285 Units Subcutaneous Q2200  . levothyroxine  75 mcg Oral Q0600  . methocarbamol  750 mg Oral TID  . rivaroxaban  20 mg Oral Daily  . sertraline  200 mg Oral QHS  . sodium chloride flush  3 mL Intravenous Once   Continuous Infusions: . sodium chloride 125 mL/hr at 07/31/18 0642  . cefTRIAXone (ROCEPHIN)  IV     PRN Meds:.acetaminophen **OR** acetaminophen, albuterol, ketorolac, ondansetron **OR** ondansetron (ZOFRAN) IV  HPI: Cheyenne Guzman is a 53 y.o. female with a history of diabetes, morbid obesity and PE.  She also has a history of chronic lower extremity lymphedema complicated by recurrent lower extremity cellulitis.  She was hospitalized with a bout of left leg cellulitis in 2014.  She was treated as an outpatient for right leg cellulitis in 2019.  She was hospitalized again with left leg cellulitis in May of this year she improved and was discharged on oral cephalexin.  Admitted again last month and treated with cefazolin for recurrent left leg cellulitis and given 1 dose of long-acting oritavancin prior to discharge on 07/14/2018.  She was also given a prescription for oral amoxicillin to take at the first sign of future recurrences.    She had an E visit with my partner, [name] FNP yesterday afternoon and  was doing well.  Late yesterday afternoon she had sudden onset of fever and chills.  She pulled back a blanket over her left leg and it was swollen and bright red again.  She took 1 dose of amoxicillin but continued to worsen leading to readmission last night.  She was started on broad empiric antibiotic therapy with vancomycin and piperacillin tazobactam and is feeling better this morning.  She has had some residual desquamation of her left leg left over from her last bout of cellulitis.  She has been using moisturizing cream.  Sometime in the past few months she tried wearing compression stockings that she  bought on [employer].  She did not have any problem getting them on and was fairly comfortable wearing them but developed a severe burning sensation in her skin when she took them off so she stopped using them.  She is scheduled to be seen in lymphedema clinic soon for her first visit.   Review of Systems: Review of Systems  Constitutional: Positive for chills, fever and malaise/fatigue. Negative for diaphoresis.  Respiratory: Negative for cough and shortness of breath.   Cardiovascular: Negative for chest pain.  Gastrointestinal: Negative for abdominal pain, diarrhea, nausea and vomiting.  Genitourinary: Negative for dysuria.  Neurological: Negative for headaches.    Past Medical History:  Diagnosis Date  . Anxiety    per pt. - mild   . Arthritis    back & ankles   . Asthma    seasonal   . Cancer [facility])    breast, sees Dr. Jana Hakim , Left - 11/2007  . Complication of anesthesia 2013   severe muscle spasms-sore-no doc  . Depression   . Diabetes mellitus    sees Dr. Chalmers Cater, diagnosed 2003  . HA (headache)   . Hearing loss    Went to [employer] [facility]roat,Dr [facility]  . Hyperlipidemia   . Kidney infection    - hosp. [facility]- septic- 10/2009  . Low back pain   . Peripheral neuropathy    R sided numbness- face & jaw  . Sleep apnea 9/15   severe-just started cpap-doing well  . TIA (transient ischemic attack)    in 2007,2008, and 2011  . Tinnitus of right ear   . Urosepsis 08-2009   with Klebsiella    Social History   Tobacco Use  . Smoking status: Former Smoker    Quit date: 02/18/1997    Years since quitting: 21.4  . Smokeless tobacco: Never Used  Substance Use Topics  . Alcohol use: Yes    Comment: rare  . Drug use: No    Family History  Problem Relation Age of Onset  . Cancer Mother        breast  . Cancer Father        lung  . Diabetes Father   . Cancer Sister        colon  . Cancer Maternal Aunt        breast - both  . Diabetes Sister   .  Anesthesia problems Neg Hx    Allergies  Allergen Reactions  . Vicodin [Hydrocodone-Acetaminophen] Nausea And Vomiting    OBJECTIVE: Blood pressure 121/72, pulse 79, temperature 98.1 F (36.7 C), temperature source Oral, resp. rate 14, height 5\' 7"  (1.702 m), weight (!) 232 kg, SpO2 94 %.  Physical Exam Constitutional:      Comments: She is resting quietly in bed.  She is talkative and pleasant but did become tearful when talking  about how frightening it is to have sudden severe fever and chills and have to come back to the hospital again.  Cardiovascular:     Rate and Rhythm: Normal rate and regular rhythm.     Heart sounds: No murmur.  Pulmonary:     Effort: Pulmonary effort is normal.     Breath sounds: Normal breath sounds.  Abdominal:     Palpations: Abdomen is soft.     Tenderness: There is no abdominal tenderness.  Skin:    Comments: She has early changes of venous stasis dermatitis in both lower legs.  She has erythema the dorsum of her left foot extending up to just below her knee.  There is no erythema outside of the inked border that was placed on admission.  She has superficial desquamation her left lower leg.     Lab Results Lab Results  Component Value Date   WBC 13.2 (H) 07/31/2018   HGB 11.8 (L) 07/31/2018   HCT 40.6 07/31/2018   MCV 81.2 07/31/2018   PLT 220 07/31/2018    Lab Results  Component Value Date   CREATININE 1.15 (H) 07/31/2018   BUN 17 07/31/2018   NA 135 07/31/2018   K 4.6 07/31/2018   CL 102 07/31/2018   CO2 23 07/31/2018    Lab Results  Component Value Date   ALT 15 07/30/2018   AST 21 07/30/2018   ALKPHOS 98 07/30/2018   BILITOT 0.4 07/30/2018     Microbiology: Recent Results (from the past 240 hour(s))  Culture, blood (Routine x 2)     Status: None (Preliminary result)   Collection Time: 07/30/18 11:27 PM   Specimen: BLOOD  Result Value Ref Range Status   Specimen Description BLOOD LEFT ARM  Final   Special Requests   Final     BOTTLES DRAWN AEROBIC AND ANAEROBIC Blood Culture adequate volume   Culture  Setup Time   Final    GRAM NEGATIVE RODS AEROBIC BOTTLE ONLY Organism ID to follow CRITICAL RESULT CALLED TO, READ BACK BY AND VERIFIED WITH: Lanae Boast PharmD 15:35 07/31/18 ([email or web address])    Culture   Final    NO GROWTH < 12 HOURS Performed at [facility], [address] [place], [place] [address]    Report Status PENDING  Incomplete  Culture, blood (Routine x 2)     Status: None (Preliminary result)   Collection Time: 07/30/18 11:27 PM   Specimen: BLOOD RIGHT HAND  Result Value Ref Range Status   Specimen Description BLOOD RIGHT HAND  Final   Special Requests   Final    BOTTLES DRAWN AEROBIC ONLY Blood Culture results may not be optimal due to an excessive volume of blood received in culture bottles   Culture  Setup Time GRAM NEGATIVE RODS AEROBIC BOTTLE ONLY   Final   Culture   Final    NO GROWTH < 12 HOURS Performed at [facility], [place] [address] [place], [place] [address]    Report Status PENDING  Incomplete  Blood Culture ID Panel (Reflexed)     Status: Abnormal   Collection Time: 07/30/18 11:27 PM  Result Value Ref Range Status   Enterococcus species NOT DETECTED NOT DETECTED Final   Listeria monocytogenes NOT DETECTED NOT DETECTED Final   Staphylococcus species NOT DETECTED NOT DETECTED Final   Staphylococcus aureus (BCID) NOT DETECTED NOT DETECTED Final   Streptococcus species NOT DETECTED NOT DETECTED Final   Streptococcus agalactiae NOT DETECTED NOT DETECTED Final  Streptococcus pneumoniae NOT DETECTED NOT DETECTED Final   Streptococcus pyogenes NOT DETECTED NOT DETECTED Final   Acinetobacter baumannii NOT DETECTED NOT DETECTED Final   Enterobacteriaceae species DETECTED (A) NOT DETECTED Final    Comment: Enterobacteriaceae represent a large family of gram-negative bacteria, not a single organism. CRITICAL RESULT CALLED TO, READ BACK BY AND VERIFIED WITH: Lanae Boast PharmD  15:35 07/31/18 ([email or web address])    Enterobacter cloacae complex NOT DETECTED NOT DETECTED Final   Escherichia coli NOT DETECTED NOT DETECTED Final   Klebsiella oxytoca NOT DETECTED NOT DETECTED Final   Klebsiella pneumoniae NOT DETECTED NOT DETECTED Final   Proteus species NOT DETECTED NOT DETECTED Final   Serratia marcescens DETECTED (A) NOT DETECTED Final    Comment: CRITICAL RESULT CALLED TO, READ BACK BY AND VERIFIED WITH: Lanae Boast PharmD 15:35 07/31/18 ([email or web address])    Carbapenem resistance NOT DETECTED NOT DETECTED Final   Haemophilus influenzae NOT DETECTED NOT DETECTED Final   Neisseria meningitidis NOT DETECTED NOT DETECTED Final   Pseudomonas aeruginosa NOT DETECTED NOT DETECTED Final   Candida albicans NOT DETECTED NOT DETECTED Final   Candida glabrata NOT DETECTED NOT DETECTED Final   Candida krusei NOT DETECTED NOT DETECTED Final   Candida parapsilosis NOT DETECTED NOT DETECTED Final   Candida tropicalis NOT DETECTED NOT DETECTED Final    Comment: Performed at [facility], [place] [address] [place], [place] [address]  SARS Coronavirus 2 (CEPHEID - Performed in [place] hospital lab), Hosp Order     Status: None   Collection Time: 07/31/18  4:46 AM   Specimen: Nasopharyngeal Swab  Result Value Ref Range Status   SARS Coronavirus 2 NEGATIVE NEGATIVE Final    Comment: (NOTE) If result is NEGATIVE SARS-CoV-2 target nucleic acids are NOT DETECTED. The SARS-CoV-2 RNA is generally detectable in upper and lower  respiratory specimens during the acute phase of infection. The lowest  concentration of SARS-CoV-2 viral copies this assay can detect is 250  copies / mL. A negative result does not preclude SARS-CoV-2 infection  and should not be used as the sole basis for treatment or other  patient management decisions.  A negative result may occur with  improper specimen collection / handling, submission of specimen other  than nasopharyngeal swab, presence of viral mutation(s) within  the  areas targeted by this assay, and inadequate number of viral copies  (<250 copies / mL). A negative result must be combined with clinical  observations, patient history, and epidemiological information. If result is POSITIVE SARS-CoV-2 target nucleic acids are DETECTED. The SARS-CoV-2 RNA is generally detectable in upper and lower  respiratory specimens dur ing the acute phase of infection.  Positive  results are indicative of active infection with SARS-CoV-2.  Clinical  correlation with patient history and other diagnostic information is  necessary to determine patient infection status.  Positive results do  not rule out bacterial infection or co-infection with other viruses. If result is PRESUMPTIVE POSTIVE SARS-CoV-2 nucleic acids MAY BE PRESENT.   A presumptive positive result was obtained on the submitted specimen  and confirmed on repeat testing.  While 2019 novel coronavirus  (SARS-CoV-2) nucleic acids may be present in the submitted sample  additional confirmatory testing may be necessary for epidemiological  and / or clinical management purposes  to differentiate between  SARS-CoV-2 and other Sarbecovirus currently known to infect humans.  If clinically indicated additional testing with an alternate test  methodology [phone]) is advised. The SARS-CoV-2 RNA is generally  detectable  in upper and lower respiratory sp ecimens during the acute  phase of infection. The expected result is Negative. Fact Sheet for Patients:  StrictlyIdeas.no Fact Sheet for Healthcare Providers: BankingDealers.co.za This test is not yet approved or cleared by the [place] FDA and has been authorized for detection and/or diagnosis of SARS-CoV-2 by FDA under an Emergency Use Authorization (EUA).  This EUA will remain in effect (meaning this test can be used) for the duration of the COVID-19 declaration under Section 564(b)(1) of the Act, 21  U.S.C. section 360bbb-3(b)(1), unless the authorization is terminated or revoked sooner. Performed at [facility], [place] [address], [place], [place] [address]     Michel Bickers, [place] [facility] [phone] pager   [phone] cell 07/31/2018, 4:06 PM

## 2018-07-31 NOTE — Plan of Care (Signed)
Continue to monitor

## 2018-07-31 NOTE — ED Notes (Signed)
Joellen Jersey (daughter) [phone]; call for updates.

## 2018-07-31 NOTE — ED Notes (Signed)
Pt is tearful in lobby. Wishes to get feet raised as it is painful to sit in wheelchair. Pt is offered a recliner in green hall. Pt agrees. Pt stands up and sits on recliner with minimal assistance. Secured via Medical illustrator. Reports increased comfort.

## 2018-07-31 NOTE — Telephone Encounter (Signed)
I see she was admitted again

## 2018-07-31 NOTE — Care Management (Addendum)
Attempt to assess patient at bedside for home care needs/ readmissions. She continued to fall asleep repeatedly during conversation. Will need to follow up when she is more awake.  Medicare [place] list left at bedside

## 2018-07-31 NOTE — Progress Notes (Addendum)
Inpatient Diabetes Program Recommendations  AACE/ADA: New Consensus Statement on Inpatient Glycemic Control (2015)  Target Ranges:  Prepandial:   less than 140 mg/dL      Peak postprandial:   less than 180 mg/dL (1-2 hours)      Critically ill patients:  140 - 180 mg/dL   Lab Results  Component Value Date   GLUCAP 148 (H) 07/31/2018   HGBA1C 9.0 (H) 06/19/2018    Review of Glycemic Control  Diabetes history: DM 2, possibly looking for new Endocrinologist Outpatient Diabetes medications: Lantus 285 Current orders for Inpatient glycemic control: Lantus 285 units Daily, Novolog 0-20 units tid  Inpatient Diabetes Program Recommendations:    Spoke with patient at bedside. RN noted "snacks" brought by family including high carbohydrate foods. DM Coordinator saw patient 2 weeks ago with last admission. Patient reports she was was injecting in the same location prior to last admission.  For the past 2 weeks patient has been splitting the Lantus dose to 185 units in the am and 100 units qpm. Per patient her glucose has been between 140-160 at home the last 2 weeks.   Attached list of local Endocrinologists to her AVS per her request. Will follow. If glucose trends are elevated consider splitting dose to 185 units qam and 100 units qpm.  Thanks,  Tama Headings RN, MSN, BC-ADM Inpatient Diabetes Coordinator Team Pager [phone] (8a-5p)

## 2018-07-31 NOTE — Progress Notes (Signed)
Diabetes coordinator in patient's room at this time. Family brought patient several snacks. Diabetes coordinator spoke with patient and discussed the importance of knowing her intake for adequate insulin requirements. Patient verbalized understanding.   Emelda Fear, RN

## 2018-07-31 NOTE — Progress Notes (Signed)
PHARMACY - PHYSICIAN COMMUNICATION CRITICAL VALUE ALERT - BLOOD CULTURE IDENTIFICATION (BCID)  Cheyenne Guzman is an 53 y.o. female who presented to [facility] on 07/30/2018 with a chief complaint of R L leg cellulitis.   Assessment:  Has recurrent history of cellulitis - last received oritavancin on 6/22. WBC up and febrile today. Bcx (2/4 bottles) growing GNR today - BCID showing Serratia (no resistance). ID is following patient.   Name of physician (or Provider) Contacted: Dr Grandville Silos  Current antibiotics: Cefazolin  Changes to prescribed antibiotics recommended:  Change to Ceftriaxone 2g IV every 24 hours  Results for orders placed or performed during the hospital encounter of 07/30/18  Blood Culture ID Panel (Reflexed) (Collected: 07/30/2018 11:27 PM)  Result Value Ref Range   Enterococcus species NOT DETECTED NOT DETECTED   Listeria monocytogenes NOT DETECTED NOT DETECTED   Staphylococcus species NOT DETECTED NOT DETECTED   Staphylococcus aureus (BCID) NOT DETECTED NOT DETECTED   Streptococcus species NOT DETECTED NOT DETECTED   Streptococcus agalactiae NOT DETECTED NOT DETECTED   Streptococcus pneumoniae NOT DETECTED NOT DETECTED   Streptococcus pyogenes NOT DETECTED NOT DETECTED   Acinetobacter baumannii NOT DETECTED NOT DETECTED   Enterobacteriaceae species DETECTED (A) NOT DETECTED   Enterobacter cloacae complex NOT DETECTED NOT DETECTED   Escherichia coli NOT DETECTED NOT DETECTED   Klebsiella oxytoca NOT DETECTED NOT DETECTED   Klebsiella pneumoniae NOT DETECTED NOT DETECTED   Proteus species NOT DETECTED NOT DETECTED   Serratia marcescens DETECTED (A) NOT DETECTED   Carbapenem resistance NOT DETECTED NOT DETECTED   Haemophilus influenzae NOT DETECTED NOT DETECTED   Neisseria meningitidis NOT DETECTED NOT DETECTED   Pseudomonas aeruginosa NOT DETECTED NOT DETECTED   Candida albicans NOT DETECTED NOT DETECTED   Candida glabrata NOT DETECTED NOT DETECTED   Candida krusei  NOT DETECTED NOT DETECTED   Candida parapsilosis NOT DETECTED NOT DETECTED   Candida tropicalis NOT DETECTED NOT DETECTED   [name], PharmD, BCCCP Clinical Pharmacist  Pager: [phone] Phone: [phone] 07/31/2018  3:41 PM

## 2018-07-31 NOTE — Discharge Instructions (Addendum)
Information on my medicine - XARELTO (rivaroxaban)  WHY WAS XARELTO PRESCRIBED FOR YOU? Xarelto was prescribed to treat blood clots that may have been found in the veins of your legs (deep vein thrombosis) or in your lungs (pulmonary embolism) and to reduce the risk of them occurring again.  What do you need to know about Xarelto? The dose is one 20 mg tablet taken ONCE A DAY with your evening meal.  DO NOT stop taking Xarelto without talking to the health care provider who prescribed the medication.  Refill your prescription for 20 mg tablets before you run out.  After discharge, you should have regular check-up appointments with your healthcare provider that is prescribing your Xarelto.  In the future your dose may need to be changed if your kidney function changes by a significant amount.  What do you do if you miss a dose? If you are taking Xarelto TWICE DAILY and you miss a dose, take it as soon as you remember. You may take two 15 mg tablets (total 30 mg) at the same time then resume your regularly scheduled 15 mg twice daily the next day.  If you are taking Xarelto ONCE DAILY and you miss a dose, take it as soon as you remember on the same day then continue your regularly scheduled once daily regimen the next day. Do not take two doses of Xarelto at the same time.   Important Safety Information Xarelto is a blood thinner medicine that can cause bleeding. You should call your healthcare provider right away if you experience any of the following: ? Bleeding from an injury or your nose that does not stop. ? Unusual colored urine (red or dark brown) or unusual colored stools (red or black). ? Unusual bruising for unknown reasons. ? A serious fall or if you hit your head (even if there is no bleeding).  Some medicines may interact with Xarelto and might increase your risk of bleeding while on Xarelto. To help avoid this, consult your healthcare provider or pharmacist prior to  using any new prescription or non-prescription medications, including herbals, vitamins, non-steroidal anti-inflammatory drugs (NSAIDs) and supplements.  This website has more information on Xarelto: [email or web address]-benson.com/.  Local Endocrinologists [facility] [phone]) 1. Dr. Philemon Kingdom 2. Dr. Elayne Snare 3. Abby Shamleffer 4. Dr. Wende [facility] [phone]) 1. Dr. Delrae Rend [facility] [phone]) 1. Dr. Jacelyn Pi 2. Dr. Madelin Rear [facility] [phone]) 1. Dr. Daneil Dolin [facility] [phone]) [[place] office]  [phone]) [[place] office] 1. Dr. Lenna Sciara [place] 2. Dr. Judithann Sheen [facility]) [phone]) 1. Autumn Hudnall Ronnald Ramp), PA 2. Dr. Amalia Greenhouse 3. Dr. Marsh Dolly. [facility] [phone]) Dr. Glade Lloyd

## 2018-07-31 NOTE — Progress Notes (Addendum)
Pharmacy Antibiotic Note  Cheyenne Guzman is a 53 y.o. female admitted on 07/30/2018 with cellulitis.  Pharmacy has been consulted for Vancocin and Zosyn dosing.  Pt had recently completed ABX treatment for LE cellulitis and was cleared by ID on 7/8.  Plan: Vancomycin 2500mg  IV Q24H. Goal AUC 400-550.  Expected AUC: 475.  SCr used: 0.98. Zosyn 3.375g IV Q8H (4-hour infusion).  Height: 5\' 7"  (170.2 cm) Weight: (!) 399 lb 14.6 oz (181.4 kg)(from June 2020 records) IBW/kg (Calculated) : 61.6  Temp (24hrs), Avg:101.1 F (38.4 C), Min:99.7 F (37.6 C), Max:103 F (39.4 C)  Recent Labs  Lab 07/30/18 2326 07/31/18 0101  WBC 8.8  --   CREATININE 0.98  --   LATICACIDVEN 2.4* 1.9    Estimated Creatinine Clearance: 114.8 mL/min (by C-G formula based on SCr of 0.98 mg/dL).    Allergies  Allergen Reactions  . Vicodin [Hydrocodone-Acetaminophen] Nausea And Vomiting     Thank you for allowing pharmacy to be a part of this patient's care.  Wynona Neat, PharmD, BCPS  07/31/2018 3:22 AM

## 2018-07-31 NOTE — ED Notes (Signed)
ED TO INPATIENT HANDOFF REPORT  ED Nurse Name and Phone #: Tray Martinique, [phone]  S Name/Age/Gender Cheyenne Guzman 53 y.o. female Room/Bed: 019C/019C  Code Status   Code Status: Full Code  Home/SNF/Other Home Patient oriented to: self, place, time and situation Is this baseline? Yes   Triage Complete: Triage complete  Chief Complaint fever swollen leg chills septic in past  Triage Note Pt reports continued left leg cellulitis and fevers at home. Pt admitted twice in the past 3 months for the same. Pt a.o, nad noted   Allergies Allergies  Allergen Reactions  . Vicodin [Hydrocodone-Acetaminophen] Nausea And Vomiting    Level of Care/Admitting Diagnosis ED Disposition    ED Disposition Condition [facility]: [place] [100100]  Level of Care: Progressive [102]  I expect the patient will be discharged within 24 hours: No (not a candidate for 5C-Observation unit)  Covid Evaluation: Asymptomatic Screening Protocol (No Symptoms)  Diagnosis: Cellulitis of left leg [354656]  Admitting Physician: Etta Quill [[id number]]  Attending Physician: Etta Quill [[id number]]  PT Class (Do Not Modify): Observation [104]  PT Acc Code (Do Not Modify): Observation [10022]       B Medical/Surgery History Past Medical History:  Diagnosis Date  . Anxiety    per pt. - mild   . Arthritis    back & ankles   . Asthma    seasonal   . Cancer [facility])    breast, sees Dr. Jana Hakim , Left - 11/2007  . Complication of anesthesia 2013   severe muscle spasms-sore-no doc  . Depression   . Diabetes mellitus    sees Dr. Chalmers Cater, diagnosed 2003  . HA (headache)   . Hearing loss    Went to [facility]  . Hyperlipidemia   . Kidney infection    - hosp. [facility]- septic- 10/2009  . Low back pain   . Peripheral neuropathy    R sided numbness- face & jaw  . Sleep apnea 9/15   severe-just started cpap-doing well  . TIA  (transient ischemic attack)    in 2007,2008, and 2011  . Tinnitus of right ear   . Urosepsis 08-2009   with Klebsiella   Past Surgical History:  Procedure Laterality Date  . ABDOMINAL HYSTERECTOMY  08/2004  . BREAST BIOPSY  06/22/2011   Procedure: BREAST BIOPSY;  Surgeon: Stark Klein, MD;  Location: [place];  Service: General;  Laterality: Right;  . BREAST SURGERY  11/2007   left ductal carcinoma in situ  . CARPAL TUNNEL RELEASE Right 10/13/2013   Procedure: RIGHT CARPAL TUNNEL RELEASE;  Surgeon: Hessie Dibble, MD;  Location: [place];  Service: Orthopedics;  Laterality: Right;  . CARPAL TUNNEL RELEASE Left 11/24/2013   Procedure: LEFT CARPAL TUNNEL RELEASE WITH CYST EXCISION ;  Surgeon: Hessie Dibble, MD;  Location: [place];  Service: Orthopedics;  Laterality: Left;  . EAR CYST EXCISION Left 11/24/2013   Procedure: CYST REMOVAL;  Surgeon: Hessie Dibble, MD;  Location: [place];  Service: Orthopedics;  Laterality: Left;  . FOOT SURGERY  2000   plantar fasciitis-left  . LUMBAR DISC SURGERY  03-29-14   Fusion, revision 04-27-14 L4-5 Cauda Equina with graft  . NERVE GRAFT  06-01-08   cadaver graft to right inferior alveolar nerve  in [place]     A IV Location/Drains/Wounds Patient Lines/Drains/Airways Status   Active Line/Drains/Airways  Name:   Placement date:   Placement time:   Site:   Days:   Peripheral IV 07/31/18 Left Arm   07/31/18    0208    Arm   less than 1   External Urinary Catheter   07/11/18    1900    -   20          Intake/Output Last 24 hours No intake or output data in the 24 hours ending 07/31/18 0424  Labs/Imaging Results for orders placed or performed during the hospital encounter of 07/30/18 (from the past 48 hour(s))  Comprehensive metabolic panel     Status: Abnormal   Collection Time: 07/30/18 11:26 PM  Result Value Ref Range   Sodium 137 135 - 145 mmol/L   Potassium 4.3 3.5 - 5.1 mmol/L    Chloride 100 98 - 111 mmol/L   CO2 24 22 - 32 mmol/L   Glucose, Bld 318 (H) 70 - 99 mg/dL   BUN 16 6 - 20 mg/dL   Creatinine, Ser 0.98 0.44 - 1.00 mg/dL   Calcium 9.0 8.9 - 10.3 mg/dL   Total Protein 7.8 6.5 - 8.1 g/dL   Albumin 3.7 3.5 - 5.0 g/dL   AST 21 15 - 41 U/L   ALT 15 0 - 44 U/L   Alkaline Phosphatase 98 38 - 126 U/L   Total Bilirubin 0.4 0.3 - 1.2 mg/dL   GFR calc non Af Amer >60 >60 mL/min   GFR calc Af Amer >60 >60 mL/min   Anion gap 13 5 - 15    Comment: Performed at [facility], [address], [place], [place] [address]  Lactic acid, plasma     Status: Abnormal   Collection Time: 07/30/18 11:26 PM  Result Value Ref Range   Lactic Acid, Venous 2.4 (HH) 0.5 - 1.9 mmol/L    Comment: CRITICAL RESULT CALLED TO, READ BACK BY AND VERIFIED WITH: BOOZER,C RN 07/31/2018 0005 JORDANS Performed at [facility], [place] [address] [place], [place] [address]   CBC with Differential     Status: Abnormal   Collection Time: 07/30/18 11:26 PM  Result Value Ref Range   WBC 8.8 4.0 - 10.5 K/uL   RBC 5.31 (H) 3.87 - 5.11 MIL/uL   Hemoglobin 12.7 12.0 - 15.0 g/dL   HCT 42.8 36.0 - 46.0 %   MCV 80.6 80.0 - 100.0 fL   MCH 23.9 (L) 26.0 - 34.0 pg   MCHC 29.7 (L) 30.0 - 36.0 g/dL   RDW 17.4 (H) 11.5 - 15.5 %   Platelets 240 150 - 400 K/uL   nRBC 0.0 0.0 - 0.2 %   Neutrophils Relative % 86 %   Neutro Abs 7.5 1.7 - 7.7 K/uL   Lymphocytes Relative 7 %   Lymphs Abs 0.6 (L) 0.7 - 4.0 K/uL   Monocytes Relative 6 %   Monocytes Absolute 0.5 0.1 - 1.0 K/uL   Eosinophils Relative 1 %   Eosinophils Absolute 0.1 0.0 - 0.5 K/uL   Basophils Relative 0 %   Basophils Absolute 0.0 0.0 - 0.1 K/uL   Immature Granulocytes 0 %   Abs Immature Granulocytes 0.02 0.00 - 0.07 K/uL    Comment: Performed at [facility], [place] [address] [place], [place] [address]  Protime-INR     Status: Abnormal   Collection Time: 07/30/18 11:26 PM  Result Value Ref Range   Prothrombin Time 22.3 (H)  11.4 - 15.2 seconds  INR 2.0 (H) 0.8 - 1.2    Comment: (NOTE) INR goal varies based on device and disease states. Performed at [facility], [place] [address], [place], [place] [address]   Lactic acid, plasma     Status: None   Collection Time: 07/31/18  1:01 AM  Result Value Ref Range   Lactic Acid, Venous 1.9 0.5 - 1.9 mmol/L    Comment: Performed at [facility] [address], [place],  [address]  CBG monitoring, ED     Status: Abnormal   Collection Time: 07/31/18  3:44 AM  Result Value Ref Range   Glucose-Capillary 196 (H) 70 - 99 mg/dL   Comment 1 Notify RN    Comment 2 Document in Chart    Dg Tibia/fibula Left Port  Result Date: 07/31/2018 CLINICAL DATA:  Cellulitis EXAM: PORTABLE LEFT TIBIA AND FIBULA - 2 VIEW COMPARISON:  None. FINDINGS: There is no acute displaced fracture or dislocation. There is nonspecific soft tissue swelling about the lower extremity. There are advanced degenerative changes of the knee. There is no radiopaque foreign body. No definite subcutaneous gas. There is a moderate-sized plantar calcaneal spur. IMPRESSION: 1. No acute osseous abnormality. 2. Nonspecific soft tissue swelling about the lower extremity which can be seen in patients with cellulitis. Electronically Signed   By: Constance Holster M.D.   On: 07/31/2018 03:47    Pending Labs Unresulted Labs (From admission, onward)    Start     Ordered   07/31/18 0500  CBC  Tomorrow morning,   R     07/31/18 0408   07/31/18 5329  Basic metabolic panel  Tomorrow morning,   R     07/31/18 0408   07/31/18 0304  Aerobic Culture (superficial specimen)  Once,   STAT     07/31/18 0308   07/31/18 0301  MRSA PCR Screening  Once,   STAT     07/31/18 0301   07/31/18 0246  SARS Coronavirus 2 (CEPHEID - Performed in [place] hospital lab), Hosp Order  (Asymptomatic Patients Labs)  Once,   STAT    Question:  Rule Out  Answer:  Yes   07/31/18 0245   07/30/18 2257  Culture, blood (Routine x  2)  BLOOD CULTURE X 2,   STAT     07/30/18 2257          Vitals/Pain Today's Vitals   07/31/18 0202 07/31/18 0209 07/31/18 0345 07/31/18 0400  BP:   133/61 138/60  Pulse:   (!) 110 (!) 115  Resp:   (!) 24 (!) 22  Temp: (!) 103 F (39.4 C)     TempSrc: Oral     SpO2:   93% 94%  Weight:  (!) 181.4 kg    Height:  5\' 7"  (1.702 m)      Isolation Precautions No active isolations  Medications Medications  sodium chloride flush (NS) 0.9 % injection 3 mL (has no administration in time range)  vancomycin (VANCOCIN) 2,500 mg in sodium chloride 0.9 % 500 mL IVPB (2,500 mg Intravenous New Bag/Given 07/31/18 0422)  albuterol (PROVENTIL) (2.5 MG/3ML) 0.083% nebulizer solution 2.5 mg (has no administration in time range)  ALPRAZolam (XANAX) tablet 1 mg (has no administration in time range)  Buprenorphine PTWK 1 patch (has no administration in time range)  gabapentin (NEURONTIN) capsule 300 mg (300 mg Oral Given 07/31/18 0409)  levothyroxine (SYNTHROID) tablet 75 mcg (has no administration in time range)  methocarbamol (ROBAXIN) tablet 750 mg (  has no administration in time range)  rivaroxaban (XARELTO) tablet 20 mg (has no administration in time range)  sertraline (ZOLOFT) tablet 200 mg (has no administration in time range)  insulin aspart (novoLOG) injection 0-20 Units (has no administration in time range)  vancomycin (VANCOCIN) 2,500 mg in sodium chloride 0.9 % 500 mL IVPB (has no administration in time range)  piperacillin-tazobactam (ZOSYN) IVPB 3.375 g (has no administration in time range)  Insulin Glargine (LANTUS) Solostar Pen 285 Units (has no administration in time range)  acetaminophen (TYLENOL) tablet 650 mg (has no administration in time range)    Or  acetaminophen (TYLENOL) suppository 650 mg (has no administration in time range)  ondansetron (ZOFRAN) tablet 4 mg (has no administration in time range)    Or  ondansetron (ZOFRAN) injection 4 mg (has no administration in time range)   ketorolac (TORADOL) 30 MG/ML injection 30 mg (has no administration in time range)  0.9 %  sodium chloride infusion (has no administration in time range)  acetaminophen (TYLENOL) tablet 1,000 mg (1,000 mg Oral Given 07/31/18 0354)  ondansetron (ZOFRAN) injection 4 mg (4 mg Intravenous Given 07/31/18 0345)  morphine 4 MG/ML injection 4 mg (4 mg Intravenous Given 07/31/18 0345)  piperacillin-tazobactam (ZOSYN) IVPB 3.375 g (0 g Intravenous Stopped 07/31/18 0421)    Mobility walks with device     Focused Assessments Skin   R Recommendations: See Admitting Provider Note  Report given to:   Additional Notes: Pt placed on 2L O2 due to sats between 89-91 while laying in bed

## 2018-07-31 NOTE — Progress Notes (Signed)
Pharmacy Antibiotic Note  Cheyenne Guzman is a 53 y.o. female admitted on 07/30/2018 with recurrent left leg  cellulitis.  Pharmacy has been consulted for cefazolin dosing. Patient was recently admitted for the same complaint and given a 1 time dose of oritavancin on 6/22. WBC slightly up at 13.2 and Tmax 103. SCr is slightly elevated at 1.15 (Baseline <1)  Plan: Cefazolin 2 gm every 8 hours Monitor renal function and clinical status   Height: 5\' 7"  (170.2 cm) Weight: (!) 511 lb 7.5 oz (232 kg) IBW/kg (Calculated) : 61.6  Temp (24hrs), Avg:100.1 F (37.8 C), Min:98.2 F (36.8 C), Max:103 F (39.4 C)  Recent Labs  Lab 07/30/18 2326 07/31/18 0101 07/31/18 0455  WBC 8.8  --  13.2*  CREATININE 0.98  --  1.15*  LATICACIDVEN 2.4* 1.9  --     Estimated Creatinine Clearance: 115.9 mL/min (A) (by C-G formula based on SCr of 1.15 mg/dL (H)).    Allergies  Allergen Reactions  . Vicodin [Hydrocodone-Acetaminophen] Nausea And Vomiting    Antimicrobials this admission: Vanc/Zosyn X 1 7/9 7/9 Cefazolin>>    Thank you for allowing pharmacy to be a part of this patient's care.  Jimmy Footman, PharmD, BCPS, BCIDP Infectious Diseases Clinical Pharmacist Phone: [phone] 07/31/2018 10:51 AM

## 2018-08-01 DIAGNOSIS — Z87891 Personal history of nicotine dependence: Secondary | ICD-10-CM | POA: Diagnosis not present

## 2018-08-01 DIAGNOSIS — R7881 Bacteremia: Secondary | ICD-10-CM | POA: Diagnosis not present

## 2018-08-01 DIAGNOSIS — J45909 Unspecified asthma, uncomplicated: Secondary | ICD-10-CM | POA: Diagnosis present

## 2018-08-01 DIAGNOSIS — A4153 Sepsis due to Serratia: Secondary | ICD-10-CM | POA: Diagnosis present

## 2018-08-01 DIAGNOSIS — Z853 Personal history of malignant neoplasm of breast: Secondary | ICD-10-CM | POA: Diagnosis not present

## 2018-08-01 DIAGNOSIS — G4733 Obstructive sleep apnea (adult) (pediatric): Secondary | ICD-10-CM | POA: Diagnosis present

## 2018-08-01 DIAGNOSIS — Z1159 Encounter for screening for other viral diseases: Secondary | ICD-10-CM | POA: Diagnosis not present

## 2018-08-01 DIAGNOSIS — E039 Hypothyroidism, unspecified: Secondary | ICD-10-CM | POA: Diagnosis present

## 2018-08-01 DIAGNOSIS — E1169 Type 2 diabetes mellitus with other specified complication: Secondary | ICD-10-CM | POA: Diagnosis present

## 2018-08-01 DIAGNOSIS — G8929 Other chronic pain: Secondary | ICD-10-CM | POA: Diagnosis present

## 2018-08-01 DIAGNOSIS — E785 Hyperlipidemia, unspecified: Secondary | ICD-10-CM | POA: Diagnosis present

## 2018-08-01 DIAGNOSIS — Z8673 Personal history of transient ischemic attack (TIA), and cerebral infarction without residual deficits: Secondary | ICD-10-CM | POA: Diagnosis not present

## 2018-08-01 DIAGNOSIS — Z6841 Body Mass Index (BMI) 40.0 and over, adult: Secondary | ICD-10-CM | POA: Diagnosis not present

## 2018-08-01 DIAGNOSIS — Z872 Personal history of diseases of the skin and subcutaneous tissue: Secondary | ICD-10-CM | POA: Diagnosis not present

## 2018-08-01 DIAGNOSIS — L03116 Cellulitis of left lower limb: Secondary | ICD-10-CM | POA: Diagnosis present

## 2018-08-01 DIAGNOSIS — G894 Chronic pain syndrome: Secondary | ICD-10-CM

## 2018-08-01 DIAGNOSIS — F419 Anxiety disorder, unspecified: Secondary | ICD-10-CM | POA: Diagnosis present

## 2018-08-01 DIAGNOSIS — Z7901 Long term (current) use of anticoagulants: Secondary | ICD-10-CM | POA: Diagnosis not present

## 2018-08-01 DIAGNOSIS — Z794 Long term (current) use of insulin: Secondary | ICD-10-CM | POA: Diagnosis not present

## 2018-08-01 DIAGNOSIS — F329 Major depressive disorder, single episode, unspecified: Secondary | ICD-10-CM | POA: Diagnosis present

## 2018-08-01 DIAGNOSIS — A498 Other bacterial infections of unspecified site: Secondary | ICD-10-CM

## 2018-08-01 DIAGNOSIS — B9689 Other specified bacterial agents as the cause of diseases classified elsewhere: Secondary | ICD-10-CM | POA: Diagnosis not present

## 2018-08-01 DIAGNOSIS — J4520 Mild intermittent asthma, uncomplicated: Secondary | ICD-10-CM | POA: Diagnosis not present

## 2018-08-01 DIAGNOSIS — Z86711 Personal history of pulmonary embolism: Secondary | ICD-10-CM | POA: Diagnosis not present

## 2018-08-01 DIAGNOSIS — A419 Sepsis, unspecified organism: Secondary | ICD-10-CM | POA: Diagnosis not present

## 2018-08-01 DIAGNOSIS — I890 Lymphedema, not elsewhere classified: Secondary | ICD-10-CM | POA: Diagnosis present

## 2018-08-01 DIAGNOSIS — E1140 Type 2 diabetes mellitus with diabetic neuropathy, unspecified: Secondary | ICD-10-CM | POA: Diagnosis present

## 2018-08-01 DIAGNOSIS — Z833 Family history of diabetes mellitus: Secondary | ICD-10-CM | POA: Diagnosis not present

## 2018-08-01 LAB — GLUCOSE, CAPILLARY
Glucose-Capillary: 163 mg/dL — ABNORMAL HIGH (ref 70–99)
Glucose-Capillary: 219 mg/dL — ABNORMAL HIGH (ref 70–99)
Glucose-Capillary: 184 mg/dL — ABNORMAL HIGH (ref 70–99)
Glucose-Capillary: 227 mg/dL — ABNORMAL HIGH (ref 70–99)

## 2018-08-01 LAB — BASIC METABOLIC PANEL
Anion gap: 9 (ref 5–15)
BUN: 13 mg/dL (ref 6–20)
CO2: 24 mmol/L (ref 22–32)
Calcium: 8.2 mg/dL — ABNORMAL LOW (ref 8.9–10.3)
Chloride: 105 mmol/L (ref 98–111)
Creatinine, Ser: 0.97 mg/dL (ref 0.44–1.00)
GFR calc Af Amer: >60 mL/min (ref >60)
GFR calc non Af Amer: >60 mL/min (ref >60)
Glucose, Bld: 168 mg/dL — ABNORMAL HIGH (ref 70–99)
Potassium: 4.1 mmol/L (ref 3.5–5.1)
Sodium: 138 mmol/L (ref 135–145)

## 2018-08-01 LAB — CBC WITH DIFFERENTIAL/PLATELET
Abs Immature Granulocytes: 0.07 10*3/uL (ref 0.00–0.07)
Basophils Absolute: 0 10*3/uL (ref 0.0–0.1)
Basophils Relative: 0 %
Eosinophils Absolute: 0.2 10*3/uL (ref 0.0–0.5)
Eosinophils Relative: 2 %
HCT: 37.4 % (ref 36.0–46.0)
Hemoglobin: 10.9 g/dL — ABNORMAL LOW (ref 12.0–15.0)
Immature Granulocytes: 1 %
Lymphocytes Relative: 13 %
Lymphs Abs: 1.2 10*3/uL (ref 0.7–4.0)
MCH: 23.9 pg — ABNORMAL LOW (ref 26.0–34.0)
MCHC: 29.1 g/dL — ABNORMAL LOW (ref 30.0–36.0)
MCV: 81.8 fL (ref 80.0–100.0)
Monocytes Absolute: 0.6 10*3/uL (ref 0.1–1.0)
Monocytes Relative: 7 %
Neutro Abs: 7.7 10*3/uL (ref 1.7–7.7)
Neutrophils Relative %: 77 %
Platelets: 185 10*3/uL (ref 150–400)
RBC: 4.57 MIL/uL (ref 3.87–5.11)
RDW: 17.5 % — ABNORMAL HIGH (ref 11.5–15.5)
WBC: 9.9 10*3/uL (ref 4.0–10.5)
nRBC: 0 % (ref 0.0–0.2)

## 2018-08-01 LAB — HEMOGLOBIN A1C
Hgb A1c MFr Bld: 8.4 % — ABNORMAL HIGH (ref 4.8–5.6)
Mean Plasma Glucose: 194 mg/dL

## 2018-08-01 LAB — MAGNESIUM: Magnesium: 1.9 mg/dL (ref 1.7–2.4)

## 2018-08-01 MED ORDER — PENICILLIN G BENZATHINE 1200000 UNIT/2ML IM SUSP
1.2000 10*6.[IU] | Freq: Once | INTRAMUSCULAR | Status: AC
  Administered 2018-08-02: 1.2 10*6.[IU] via INTRAMUSCULAR
  Filled 2018-08-01: qty 2

## 2018-08-01 MED ORDER — SENNOSIDES-DOCUSATE SODIUM 8.6-50 MG PO TABS
1.0000 | ORAL_TABLET | Freq: Two times a day (BID) | ORAL | Status: DC
  Administered 2018-08-01 – 2018-08-02 (×3): 1 via ORAL
  Filled 2018-08-01 (×3): qty 1

## 2018-08-01 MED ORDER — INSULIN GLARGINE 100 UNIT/ML ~~LOC~~ SOLN
100.0000 [IU] | Freq: Every day | SUBCUTANEOUS | Status: DC
  Administered 2018-08-01: 100 [IU] via SUBCUTANEOUS
  Filled 2018-08-01 (×4): qty 1

## 2018-08-01 MED ORDER — FLUCONAZOLE 150 MG PO TABS
150.0000 mg | ORAL_TABLET | ORAL | Status: DC | PRN
  Administered 2018-08-02: 150 mg via ORAL
  Filled 2018-08-01 (×2): qty 1

## 2018-08-01 MED ORDER — INSULIN GLARGINE 100 UNIT/ML ~~LOC~~ SOLN
185.0000 [IU] | Freq: Every day | SUBCUTANEOUS | Status: DC
  Administered 2018-08-02: 185 [IU] via SUBCUTANEOUS
  Filled 2018-08-01: qty 1.85

## 2018-08-01 NOTE — Evaluation (Signed)
Physical Therapy Evaluation Patient Details Name: Cheyenne Guzman MRN: [id number] DOB: 1965-01-26 Today's Date: 08/01/2018   History of Present Illness  Cheyenne Guzman a 53 y.o.femalewith medical history significant ofDM2, PE on xarelto, HLD, OSA, morbid obesity, chronic pain.  Patiient presents to the ED for recurrent cellulitis of LLE. She was admitted in May and June for the same. Cellulitis each time was associated with systemic sepsis symptoms, fever, tachycardia, etc.  Clinical Impression  Pt is at or close to baseline functioning and should be safe at home with family. There are no further acute PT needs.  Will sign off at this time.     Follow Up Recommendations No PT follow up;Supervision - Intermittent    Equipment Recommendations  None recommended by PT    Recommendations for Other Services       Precautions / Restrictions Precautions Precautions: None      Mobility  Bed Mobility Overal bed mobility: Modified Independent             General bed mobility comments: HOB elevated  Transfers Overall transfer level: Independent                  Ambulation/Gait Ambulation/Gait assistance: Independent Gait Distance (Feet): 50 Feet Assistive device: Rolling walker (2 wheeled) Gait Pattern/deviations: Step-through pattern Gait velocity: Decreased   General Gait Details: slower, guarded and a little antalgic, but safe at this time with a RW  Stairs            Wheelchair Mobility    Modified Rankin (Stroke Patients Only)       Balance Overall balance assessment: No apparent balance deficits (not formally assessed)                                           Pertinent Vitals/Pain Pain Assessment: Faces Faces Pain Scale: Hurts little more Pain Location: L LE Pain Descriptors / Indicators: Sore Pain Intervention(s): Monitored during session;Limited activity within patient's tolerance    Home Living Family/patient  expects to be discharged to:: Private residence Living Arrangements: Spouse/significant other;Children Available Help at Discharge: Family;Available PRN/intermittently Type of Home: House Home Access: Level entry     Home Layout: One level Home Equipment: Walker - 2 wheels;Walker - 4 wheels;Cane - single point;Tub bench Additional Comments: pt has a pool at her home and exercises/ swims in it several hours/ day in the summer    Prior Function Level of Independence: Independent         Comments: Indep with household ambulation, uses rollator when outside. Has above ground pool pt tries to walk in and do exercise in daily. No MOI for R groin pain     Hand Dominance        Extremity/Trunk Assessment   Upper Extremity Assessment Upper Extremity Assessment: Overall WFL for tasks assessed    Lower Extremity Assessment Lower Extremity Assessment: Overall WFL for tasks assessed       Communication   Communication: No difficulties  Cognition Arousal/Alertness: Awake/alert Behavior During Therapy: WFL for tasks assessed/performed Overall Cognitive Status: Within Functional Limits for tasks assessed                                        General Comments      Exercises  Assessment/Plan    PT Assessment Patent does not need any further PT services  PT Problem List         PT Treatment Interventions      PT Goals (Current goals can be found in the Care Plan section)  Acute Rehab PT Goals PT Goal Formulation: All assessment and education complete, DC therapy    Frequency     Barriers to discharge        Co-evaluation               AM-PAC PT "6 Clicks" Mobility  Outcome Measure Help needed turning from your back to your side while in a flat bed without using bedrails?: None Help needed moving from lying on your back to sitting on the side of a flat bed without using bedrails?: None Help needed moving to and from a bed to a chair  (including a wheelchair)?: None Help needed standing up from a chair using your arms (e.g., wheelchair or bedside chair)?: None Help needed to walk in hospital room?: None Help needed climbing 3-5 steps with a railing? : A Little 6 Click Score: 23    End of Session   Activity Tolerance: Patient tolerated treatment well Patient left: in chair;with call bell/phone within reach Nurse Communication: Mobility status PT Visit Diagnosis: Other abnormalities of gait and mobility (R26.89)    Time: 0973-5329 PT Time Calculation (min) (ACUTE ONLY): 53 min   Charges:   PT Evaluation $PT Eval Low Complexity: 1 Low PT Treatments $Gait Training: 8-22 mins $Therapeutic Activity: 8-22 mins        08/01/2018  Donnella Sham, PT Acute Rehabilitation Services [phone]  (pager) [phone]  (office)  [name] 08/01/2018, 5:59 PM

## 2018-08-01 NOTE — Progress Notes (Signed)
Patient ID: Cheyenne Guzman, female   DOB: 13-Oct-1965, 53 y.o.   MRN: [id number]         [facility]  Date of Admission:  07/30/2018    Total days of antibiotics 3        Day 2 ceftriaxone         ASSESSMENT: Her recurrent left leg cellulitis has responded promptly to antibiotic therapy.  Given the severity and frequency of recurrences I have decided to give her IM benzathine penicillin to complete treatment and hopefully interrupt frequent recurrences.  I would suggest that we try this for several months to see if it works and allow her to get to the lymphedema clinic.  Yesterday her admission blood cultures turned positive for Serratia.  I doubt that that has anything to do with her cellulitis.  She may have had mild, early left pyelonephritis.  Once susceptibilities are available we can convert to an oral regimen to complete 7 days of total therapy.  PLAN: 1. Continue ceftriaxone pending antibiotic susceptibility results 2. Benzathine penicillin 1,200,000 units IM tomorrow 3. Follow-up in our clinic with [name] FNP on 08/26/2018 4. I will have my partner, Dr. Catalina Antigua [phone], follow-up this weekend and provide recommendations about discharge antibiotic  Principal Problem:   Sepsis [facility]) Active Problems:   Cellulitis of left lower extremity   Recurrent cellulitis of lower extremity   Serratia infection   Hyperlipidemia associated with type 2 diabetes mellitus ([place])   Anxiety and depression   Asthma   DCIS (ductal carcinoma in situ) of breast, left, treated with BCT 11/2007   Obstructive sleep apnea   Diabetic neuropathy ([place])   Lymphedema of both lower extremities   Hypothyroidism   Pulmonary embolism ([place])   Diabetes mellitus type 2 in obese ([place])   Chronic pain   Morbid obesity with body mass index of 60.0-69.9 in adult [facility])   Bacteremia   Scheduled Meds: . ALPRAZolam  1 mg Oral TID  . [START ON 08/02/2018] Buprenorphine  1 patch  Transdermal Q Sat  . gabapentin  300 mg Oral BID  . insulin aspart  0-20 Units Subcutaneous TID WC  . insulin glargine  100 Units Subcutaneous QHS  . [START ON 08/02/2018] insulin glargine  185 Units Subcutaneous Daily  . levothyroxine  75 mcg Oral Q0600  . methocarbamol  750 mg Oral TID  . [START ON 08/02/2018] penicillin g benzathine (BICILLIN-LA) IM  1.2 Million Units Intramuscular Once   And  . [START ON 08/02/2018] penicillin g benzathine (BICILLIN-LA) IM  1.2 Million Units Intramuscular Once  . rivaroxaban  20 mg Oral Daily  . senna-docusate  1 tablet Oral BID  . sertraline  200 mg Oral QHS  . sodium chloride flush  3 mL Intravenous Once   Continuous Infusions: . cefTRIAXone (ROCEPHIN)  IV Stopped (07/31/18 2308)   PRN Meds:.acetaminophen **OR** acetaminophen, albuterol, ketorolac, ondansetron **OR** ondansetron (ZOFRAN) IV   SUBJECTIVE: She is feeling much better.  She did not have any fever, chills or sweats overnight.  Her left leg pain is resolving.  She has not had any dysuria.  She tells me that she forgot to mention to me yesterday that she recently noted that her urine was quite cloudy and she had some mild left flank pain.  Both of those findings have now resolved.  Review of Systems: Review of Systems  Constitutional: Negative for chills, diaphoresis and fever.  Gastrointestinal: Negative for abdominal pain, diarrhea, nausea  and vomiting.  Genitourinary: Negative for dysuria and flank pain.  Musculoskeletal: Positive for joint pain.    Allergies  Allergen Reactions  . Vicodin [Hydrocodone-Acetaminophen] Nausea And Vomiting    OBJECTIVE: Vitals:   07/31/18 2303 08/01/18 0004 08/01/18 0416 08/01/18 0820  BP:  (!) 111/57 132/61 (!) 143/64  Pulse: 82 88 83 85  Resp: 16 10 13 20   Temp:  99.7 F (37.6 C) 98.3 F (36.8 C) 98.1 F (36.7 C)  TempSrc:  Oral Oral Oral  SpO2: 96% 92% 92% 95%  Weight:      Height:       Body mass index is 80.11 kg/m.  Physical  Exam Constitutional:      Comments: She is much more comfortable, talkative and in better spirits.  Abdominal:     Palpations: Abdomen is soft.     Tenderness: There is no abdominal tenderness. There is no right CVA tenderness or left CVA tenderness.  Skin:    Findings: Erythema present.     Comments: Her left lower leg erythema and warmth have decreased.  She has hanging her left leg off the side of her bed without significant discomfort.  Psychiatric:        Mood and Affect: Mood normal.     Lab Results Lab Results  Component Value Date   WBC 9.9 08/01/2018   HGB 10.9 (L) 08/01/2018   HCT 37.4 08/01/2018   MCV 81.8 08/01/2018   PLT 185 08/01/2018    Lab Results  Component Value Date   CREATININE 0.97 08/01/2018   BUN 13 08/01/2018   NA 138 08/01/2018   K 4.1 08/01/2018   CL 105 08/01/2018   CO2 24 08/01/2018    Lab Results  Component Value Date   ALT 15 07/30/2018   AST 21 07/30/2018   ALKPHOS 98 07/30/2018   BILITOT 0.4 07/30/2018     Microbiology: Recent Results (from the past 240 hour(s))  Culture, blood (Routine x 2)     Status: Abnormal (Preliminary result)   Collection Time: 07/30/18 11:27 PM   Specimen: BLOOD  Result Value Ref Range Status   Specimen Description BLOOD LEFT ARM  Final   Special Requests   Final    BOTTLES DRAWN AEROBIC AND ANAEROBIC Blood Culture adequate volume   Culture  Setup Time   Final    GRAM NEGATIVE RODS AEROBIC BOTTLE ONLY CRITICAL RESULT CALLED TO, READ BACK BY AND VERIFIED WITH: Lanae Boast PharmD 15:35 07/31/18 ([email or web address])    Culture (A)  Final    SERRATIA MARCESCENS SUSCEPTIBILITIES TO FOLLOW Performed at [facility], [place] [address] [place], [place] [address]    Report Status PENDING  Incomplete  Culture, blood (Routine x 2)     Status: None (Preliminary result)   Collection Time: 07/30/18 11:27 PM   Specimen: BLOOD RIGHT HAND  Result Value Ref Range Status   Specimen Description BLOOD RIGHT HAND  Final   Special  Requests   Final    BOTTLES DRAWN AEROBIC ONLY Blood Culture results may not be optimal due to an excessive volume of blood received in culture bottles   Culture  Setup Time GRAM NEGATIVE RODS AEROBIC BOTTLE ONLY   Final   Culture   Final    GRAM NEGATIVE RODS IDENTIFICATION AND SUSCEPTIBILITIES TO FOLLOW Performed at [facility], [place] [address] [place], [place] [address]    Report Status PENDING  Incomplete  Blood Culture ID Panel (Reflexed)  Status: Abnormal   Collection Time: 07/30/18 11:27 PM  Result Value Ref Range Status   Enterococcus species NOT DETECTED NOT DETECTED Final   Listeria monocytogenes NOT DETECTED NOT DETECTED Final   Staphylococcus species NOT DETECTED NOT DETECTED Final   Staphylococcus aureus (BCID) NOT DETECTED NOT DETECTED Final   Streptococcus species NOT DETECTED NOT DETECTED Final   Streptococcus agalactiae NOT DETECTED NOT DETECTED Final   Streptococcus pneumoniae NOT DETECTED NOT DETECTED Final   Streptococcus pyogenes NOT DETECTED NOT DETECTED Final   Acinetobacter baumannii NOT DETECTED NOT DETECTED Final   Enterobacteriaceae species DETECTED (A) NOT DETECTED Final    Comment: Enterobacteriaceae represent a large family of gram-negative bacteria, not a single organism. CRITICAL RESULT CALLED TO, READ BACK BY AND VERIFIED WITH: Lanae Boast PharmD 15:35 07/31/18 ([email or web address])    Enterobacter cloacae complex NOT DETECTED NOT DETECTED Final   Escherichia coli NOT DETECTED NOT DETECTED Final   Klebsiella oxytoca NOT DETECTED NOT DETECTED Final   Klebsiella pneumoniae NOT DETECTED NOT DETECTED Final   Proteus species NOT DETECTED NOT DETECTED Final   Serratia marcescens DETECTED (A) NOT DETECTED Final    Comment: CRITICAL RESULT CALLED TO, READ BACK BY AND VERIFIED WITH: Lanae Boast PharmD 15:35 07/31/18 ([email or web address])    Carbapenem resistance NOT DETECTED NOT DETECTED Final   Haemophilus influenzae NOT DETECTED NOT DETECTED Final   Neisseria meningitidis NOT  DETECTED NOT DETECTED Final   Pseudomonas aeruginosa NOT DETECTED NOT DETECTED Final   Candida albicans NOT DETECTED NOT DETECTED Final   Candida glabrata NOT DETECTED NOT DETECTED Final   Candida krusei NOT DETECTED NOT DETECTED Final   Candida parapsilosis NOT DETECTED NOT DETECTED Final   Candida tropicalis NOT DETECTED NOT DETECTED Final    Comment: Performed at [facility], [place] [address] [place], [place] [address]  SARS Coronavirus 2 (CEPHEID - Performed in [place] hospital lab), Hosp Order     Status: None   Collection Time: 07/31/18  4:46 AM   Specimen: Nasopharyngeal Swab  Result Value Ref Range Status   SARS Coronavirus 2 NEGATIVE NEGATIVE Final    Comment: (NOTE) If result is NEGATIVE SARS-CoV-2 target nucleic acids are NOT DETECTED. The SARS-CoV-2 RNA is generally detectable in upper and lower  respiratory specimens during the acute phase of infection. The lowest  concentration of SARS-CoV-2 viral copies this assay can detect is 250  copies / mL. A negative result does not preclude SARS-CoV-2 infection  and should not be used as the sole basis for treatment or other  patient management decisions.  A negative result may occur with  improper specimen collection / handling, submission of specimen other  than nasopharyngeal swab, presence of viral mutation(s) within the  areas targeted by this assay, and inadequate number of viral copies  (<250 copies / mL). A negative result must be combined with clinical  observations, patient history, and epidemiological information. If result is POSITIVE SARS-CoV-2 target nucleic acids are DETECTED. The SARS-CoV-2 RNA is generally detectable in upper and lower  respiratory specimens dur ing the acute phase of infection.  Positive  results are indicative of active infection with SARS-CoV-2.  Clinical  correlation with patient history and other diagnostic information is  necessary to determine patient infection status.   Positive results do  not rule out bacterial infection or co-infection with other viruses. If result is PRESUMPTIVE POSTIVE SARS-CoV-2 nucleic acids MAY BE PRESENT.   A presumptive positive result was obtained on the submitted specimen  and  confirmed on repeat testing.  While 2019 novel coronavirus  (SARS-CoV-2) nucleic acids may be present in the submitted sample  additional confirmatory testing may be necessary for epidemiological  and / or clinical management purposes  to differentiate between  SARS-CoV-2 and other Sarbecovirus currently known to infect humans.  If clinically indicated additional testing with an alternate test  methodology [phone]) is advised. The SARS-CoV-2 RNA is generally  detectable in upper and lower respiratory sp ecimens during the acute  phase of infection. The expected result is Negative. Fact Sheet for Patients:  StrictlyIdeas.no Fact Sheet for Healthcare Providers: BankingDealers.co.za This test is not yet approved or cleared by the [place] FDA and has been authorized for detection and/or diagnosis of SARS-CoV-2 by FDA under an Emergency Use Authorization (EUA).  This EUA will remain in effect (meaning this test can be used) for the duration of the COVID-19 declaration under Section 564(b)(1) of the Act, 21 U.S.C. section 360bbb-3(b)(1), unless the authorization is terminated or revoked sooner. Performed at [facility], [place] [address] [place], [place] [address]   MRSA PCR Screening     Status: None   Collection Time: 07/31/18  4:55 PM   Specimen: Nasopharyngeal  Result Value Ref Range Status   MRSA by PCR NEGATIVE NEGATIVE Final    Comment:        The GeneXpert MRSA Assay (FDA approved for NASAL specimens only), is one component of a comprehensive MRSA colonization surveillance program. It is not intended to diagnose MRSA infection nor to guide or monitor treatment for MRSA  infections. Performed at [facility], [place] [address], [place], [place] [address]     Michel Bickers, [place] [facility] [place] [facility] [phone] pager   [phone] cell 08/01/2018, 11:00 AM

## 2018-08-01 NOTE — Progress Notes (Addendum)
PROGRESS NOTE    Cheyenne Guzman  BOF:[id number] DOB: [birth year] DOA: 07/30/2018 PCP: Laurey Morale, MD    Brief Narrative:  HPI per Dr. Pryor Curia Cheyenne Guzman is a 53 y.o. female with medical history significant of DM2, PE on xarelto, HLD, OSA, morbid obesity, chronic pain.  Patient presents to the ED for recurrent cellulitis of LLE.  She was admitted in May and June for the same.  Cellulitis each time was associated with systemic sepsis symptoms, fever, tachycardia, etc.  She had been doing better, patient states she had an ED visit with infectious disease yesterday afternoon and seemed like her leg was doing okay. States later this evening around 10 PM she started developing a lot of pain in the posterior aspect of her left calf. States she got some stinging sensations and also some soreness in her left groin. States this is consistent with her prior episodes of cellulitis. States usually this is what happens, rapid onset and exponential worsening.  Associated chills, nausea.  Did take single dose of amoxicillin this afternoon (script she was given to take in case of worsening).  DVT study last admit was negative.   ED Course: Tm 103.  Tachy to 120.  Given tylenol for fever.  Started on empiric zosyn and vanc for cellulitis with sepsis.  Assessment & Plan:   Principal Problem:   Sepsis ([place]) Active Problems:   Recurrent cellulitis of lower extremity   Bacteremia   Hyperlipidemia associated with type 2 diabetes mellitus ([place])   Anxiety and depression   Asthma   DCIS (ductal carcinoma in situ) of breast, left, treated with BCT 11/2007   Cellulitis of left lower extremity   Obstructive sleep apnea   Diabetic neuropathy ([place])   Lymphedema of both lower extremities   Pulmonary embolism ([facility])   Diabetes mellitus type 2 in obese ([facility])   Chronic pain   Morbid obesity with body mass index of 60.0-69.9 in adult ([place])   Serratia infection  1 sepsis secondary to  recurrent left lower extremity cellulitis and Serratia bacteremia Patient had presented with recurrent left lower extremity cellulitis with fever temperature of 103, tachycardia with heart rates up to the 120s.  Patient noted to have chronic lower extremity lymphedema recently hospitalized with a bout of left leg cellulitis in May 2020 whereby she improved discharged on oral Keflex with recurrence again June 2020 treated with ceftezole and and received 1 dose of long-acting oritavancin prior to discharge on 07/15/2018.  Patient given a dose of oral amoxicillin to take with for sign of recurrence.  Patient presented back to the ED with fever chills, tachycardia and a temperature of 103.  Patient pancultured placed empirically on IV vancomycin and Zosyn.  Blood cultures which were done came back positive for Serratia.  Due to recurrent nature of cellulitis ID was consulted who assessed the patient and patient initially had antibiotics narrowed down from IV Vanco and IV Zosyn to IV cefazolin.  Blood cultures however came back positive for Serratia and IV antibiotics were changed to IV Rocephin for both the cellulitis and bacteremia.  Per ID patient could likely benefit from IM benzathine penicillin on a regular basis for several months to help prevent recurrences.  Patient will also need to follow-up in the lymphedema clinic on discharge.  Continue IV Rocephin pending sensitivities.  ID following and appreciate input and recommendations.  2.  Diabetes mellitus type 2 Hemoglobin A1c of 8.4 (07/31/2018).  CBG of 163 this morning.  Patient  noted to have told her diabetic coordinator that she has been splitting her Lantus dose and taking it twice daily with good blood glucose control.  We will change patient's Lantus 285 units in the morning and 100 units nightly.  Continue sliding scale insulin outpatient follow-up with her endocrinologist.  3.  Bilateral lower extremity lymphedema Outpatient follow-up in the  lymphedema clinic.  4.  Morbid obesity  5.  Obstructive sleep apnea CPAP nightly  6.  History of PE Continue Xarelto.  7.  Chronic pain Continue bupropion patch, Neurontin and Robaxin.  Outpatient follow-up.  8.  Hypothyroidism Continue home dose Synthroid.  Outpatient follow-up with endocrinology.   DVT prophylaxis: Xarelto Code Status: Full Family Communication: Updated patient.  No family at bedside. Disposition Plan: Transfer to [facility].  Likely home when clinically improved, on oral antibiotics, and when okay with ID.   Consultants:   Infectious disease: Dr. Megan Salon 07/31/2018  Procedures:   Plan films of the left tib-fib 07/31/2018  Antimicrobials:   IV Rocephin 07/31/2018  IV Zosyn 07/31/2018>>> 07/31/2018  IV vancomycin 07/31/2018>>>>>> 07/31/2018  IV Ancef 07/31/2018 1 dose.   Subjective: Patient sitting up in bed eating breakfast.  Feels cellulitis slowly improving on the left lower extremity however not at baseline.  Feels some decreased swelling.  Still with complaints of pain around the left calf region.  Objective: Vitals:   07/31/18 2303 08/01/18 0004 08/01/18 0416 08/01/18 0820  BP:  (!) 111/57 132/61 (!) 143/64  Pulse: 82 88 83 85  Resp: 16 10 13 20   Temp:  99.7 F (37.6 C) 98.3 F (36.8 C) 98.1 F (36.7 C)  TempSrc:  Oral Oral Oral  SpO2: 96% 92% 92% 95%  Weight:      Height:        Intake/Output Summary (Last 24 hours) at 08/01/2018 1012 Last data filed at 08/01/2018 7846 Gross per 24 hour  Intake 2795.7 ml  Output 800 ml  Net 1995.7 ml   Filed Weights   07/31/18 0209 07/31/18 0619  Weight: (!) 181.4 kg (!) 232 kg    Examination:  General exam: Appears calm and comfortable   Respiratory system: Clear to auscultation anterior lung fields. Respiratory effort normal. Cardiovascular system: S1 & S2 heard, RRR. No JVD, murmurs, rubs, gallops or clicks.  Decreasing edema left lower extremity.  Gastrointestinal system: Abdomen is nondistended,  soft and nontender. No organomegaly or masses felt. Normal bowel sounds heard. Central nervous system: Alert and oriented. No focal neurological deficits. Extremities: Symmetric 5 x 5 power. Skin: Left lower extremity cellulitis still with erythema, decreasing edema, less tender to palpation on the anterior left lower extremity and still painful on the posterior left lower extremity.  Some desquamation of the skin noted.  No rashes, lesions or ulcers Psychiatry: Judgement and insight appear normal. Mood & affect appropriate.     Data Reviewed: I have personally reviewed following labs and imaging studies  CBC: Recent Labs  Lab 07/30/18 2326 07/31/18 0455 08/01/18 0812  WBC 8.8 13.2* 9.9  NEUTROABS 7.5  --  7.7  HGB 12.7 11.8* 10.9*  HCT 42.8 40.6 37.4  MCV 80.6 81.2 81.8  PLT 240 220 962   Basic Metabolic Panel: Recent Labs  Lab 07/30/18 2326 07/31/18 0455 08/01/18 0812  NA 137 135 138  K 4.3 4.6 4.1  CL 100 102 105  CO2 24 23 24   GLUCOSE 318* 196* 168*  BUN 16 17 13   CREATININE 0.98 1.15* 0.97  CALCIUM 9.0 8.6* 8.2*  MG  --   --  1.9   GFR: Estimated Creatinine Clearance: 137.4 mL/min (by C-G formula based on SCr of 0.97 mg/dL). Liver Function Tests: Recent Labs  Lab 07/30/18 2326  AST 21  ALT 15  ALKPHOS 98  BILITOT 0.4  PROT 7.8  ALBUMIN 3.7   No results for input(s): LIPASE, AMYLASE in the last 168 hours. No results for input(s): AMMONIA in the last 168 hours. Coagulation Profile: Recent Labs  Lab 07/30/18 2326  INR 2.0*   Cardiac Enzymes: No results for input(s): CKTOTAL, CKMB, CKMBINDEX, TROPONINI in the last 168 hours. BNP (last 3 results) No results for input(s): PROBNP in the last 8760 hours. HbA1C: Recent Labs    07/31/18 0455  HGBA1C 8.4*   CBG: Recent Labs  Lab 07/31/18 0643 07/31/18 1252 07/31/18 1700 07/31/18 2158 08/01/18 0631  GLUCAP 167* 148* 208* 162* 163*   Lipid Profile: No results for input(s): CHOL, HDL, LDLCALC,  TRIG, CHOLHDL, LDLDIRECT in the last 72 hours. Thyroid Function Tests: No results for input(s): TSH, T4TOTAL, FREET4, T3FREE, THYROIDAB in the last 72 hours. Anemia Panel: No results for input(s): VITAMINB12, FOLATE, FERRITIN, TIBC, IRON, RETICCTPCT in the last 72 hours. Sepsis Labs: Recent Labs  Lab 07/30/18 2326 07/31/18 0101  LATICACIDVEN 2.4* 1.9    Recent Results (from the past 240 hour(s))  Culture, blood (Routine x 2)     Status: None (Preliminary result)   Collection Time: 07/30/18 11:27 PM   Specimen: BLOOD  Result Value Ref Range Status   Specimen Description BLOOD LEFT ARM  Final   Special Requests   Final    BOTTLES DRAWN AEROBIC AND ANAEROBIC Blood Culture adequate volume   Culture  Setup Time   Final    GRAM NEGATIVE RODS AEROBIC BOTTLE ONLY Organism ID to follow CRITICAL RESULT CALLED TO, READ BACK BY AND VERIFIED WITH: Lanae Boast PharmD 15:35 07/31/18 ([email or web address])    Culture   Final    NO GROWTH < 12 HOURS Performed at [facility], [address] [place], [place] [address]    Report Status PENDING  Incomplete  Culture, blood (Routine x 2)     Status: None (Preliminary result)   Collection Time: 07/30/18 11:27 PM   Specimen: BLOOD RIGHT HAND  Result Value Ref Range Status   Specimen Description BLOOD RIGHT HAND  Final   Special Requests   Final    BOTTLES DRAWN AEROBIC ONLY Blood Culture results may not be optimal due to an excessive volume of blood received in culture bottles   Culture  Setup Time GRAM NEGATIVE RODS AEROBIC BOTTLE ONLY   Final   Culture   Final    GRAM NEGATIVE RODS IDENTIFICATION AND SUSCEPTIBILITIES TO FOLLOW Performed at [facility], [place] [address] [place], [place] [address]    Report Status PENDING  Incomplete  Blood Culture ID Panel (Reflexed)     Status: Abnormal   Collection Time: 07/30/18 11:27 PM  Result Value Ref Range Status   Enterococcus species NOT DETECTED NOT DETECTED Final   Listeria monocytogenes NOT  DETECTED NOT DETECTED Final   Staphylococcus species NOT DETECTED NOT DETECTED Final   Staphylococcus aureus (BCID) NOT DETECTED NOT DETECTED Final   Streptococcus species NOT DETECTED NOT DETECTED Final   Streptococcus agalactiae NOT DETECTED NOT DETECTED Final   Streptococcus pneumoniae NOT DETECTED NOT DETECTED Final   Streptococcus pyogenes NOT DETECTED NOT DETECTED Final   Acinetobacter baumannii NOT DETECTED NOT DETECTED Final  Enterobacteriaceae species DETECTED (A) NOT DETECTED Final    Comment: Enterobacteriaceae represent a large family of gram-negative bacteria, not a single organism. CRITICAL RESULT CALLED TO, READ BACK BY AND VERIFIED WITH: Lanae Boast PharmD 15:35 07/31/18 ([email or web address])    Enterobacter cloacae complex NOT DETECTED NOT DETECTED Final   Escherichia coli NOT DETECTED NOT DETECTED Final   Klebsiella oxytoca NOT DETECTED NOT DETECTED Final   Klebsiella pneumoniae NOT DETECTED NOT DETECTED Final   Proteus species NOT DETECTED NOT DETECTED Final   Serratia marcescens DETECTED (A) NOT DETECTED Final    Comment: CRITICAL RESULT CALLED TO, READ BACK BY AND VERIFIED WITH: Lanae Boast PharmD 15:35 07/31/18 ([email or web address])    Carbapenem resistance NOT DETECTED NOT DETECTED Final   Haemophilus influenzae NOT DETECTED NOT DETECTED Final   Neisseria meningitidis NOT DETECTED NOT DETECTED Final   Pseudomonas aeruginosa NOT DETECTED NOT DETECTED Final   Candida albicans NOT DETECTED NOT DETECTED Final   Candida glabrata NOT DETECTED NOT DETECTED Final   Candida krusei NOT DETECTED NOT DETECTED Final   Candida parapsilosis NOT DETECTED NOT DETECTED Final   Candida tropicalis NOT DETECTED NOT DETECTED Final    Comment: Performed at [facility], [place] [address] [place], [place] [address]  SARS Coronavirus 2 (CEPHEID - Performed in [place] hospital lab), Hosp Order     Status: None   Collection Time: 07/31/18  4:46 AM   Specimen: Nasopharyngeal Swab  Result Value Ref Range Status    SARS Coronavirus 2 NEGATIVE NEGATIVE Final    Comment: (NOTE) If result is NEGATIVE SARS-CoV-2 target nucleic acids are NOT DETECTED. The SARS-CoV-2 RNA is generally detectable in upper and lower  respiratory specimens during the acute phase of infection. The lowest  concentration of SARS-CoV-2 viral copies this assay can detect is 250  copies / mL. A negative result does not preclude SARS-CoV-2 infection  and should not be used as the sole basis for treatment or other  patient management decisions.  A negative result may occur with  improper specimen collection / handling, submission of specimen other  than nasopharyngeal swab, presence of viral mutation(s) within the  areas targeted by this assay, and inadequate number of viral copies  (<250 copies / mL). A negative result must be combined with clinical  observations, patient history, and epidemiological information. If result is POSITIVE SARS-CoV-2 target nucleic acids are DETECTED. The SARS-CoV-2 RNA is generally detectable in upper and lower  respiratory specimens dur ing the acute phase of infection.  Positive  results are indicative of active infection with SARS-CoV-2.  Clinical  correlation with patient history and other diagnostic information is  necessary to determine patient infection status.  Positive results do  not rule out bacterial infection or co-infection with other viruses. If result is PRESUMPTIVE POSTIVE SARS-CoV-2 nucleic acids MAY BE PRESENT.   A presumptive positive result was obtained on the submitted specimen  and confirmed on repeat testing.  While 2019 novel coronavirus  (SARS-CoV-2) nucleic acids may be present in the submitted sample  additional confirmatory testing may be necessary for epidemiological  and / or clinical management purposes  to differentiate between  SARS-CoV-2 and other Sarbecovirus currently known to infect humans.  If clinically indicated additional testing with an alternate test   methodology [phone]) is advised. The SARS-CoV-2 RNA is generally  detectable in upper and lower respiratory sp ecimens during the acute  phase of infection. The expected result is Negative. Fact Sheet for Patients:  StrictlyIdeas.no Fact Sheet  for Healthcare Providers: BankingDealers.co.za This test is not yet approved or cleared by the [place] and has been authorized for detection and/or diagnosis of SARS-CoV-2 by FDA under an Emergency Use Authorization (EUA).  This EUA will remain in effect (meaning this test can be used) for the duration of the COVID-19 declaration under Section 564(b)(1) of the Act, 21 U.S.C. section 360bbb-3(b)(1), unless the authorization is terminated or revoked sooner. Performed at [facility], [place] [address], [place], [place] [address]   MRSA PCR Screening     Status: None   Collection Time: 07/31/18  4:55 PM   Specimen: Nasopharyngeal  Result Value Ref Range Status   MRSA by PCR NEGATIVE NEGATIVE Final    Comment:        The GeneXpert MRSA Assay (FDA approved for NASAL specimens only), is one component of a comprehensive MRSA colonization surveillance program. It is not intended to diagnose MRSA infection nor to guide or monitor treatment for MRSA infections. Performed at [facility], [place] [address], [place],  [address]          Radiology Studies: Dg Tibia/fibula Left Port  Result Date: 07/31/2018 CLINICAL DATA:  Cellulitis EXAM: PORTABLE LEFT TIBIA AND FIBULA - 2 VIEW COMPARISON:  None. FINDINGS: There is no acute displaced fracture or dislocation. There is nonspecific soft tissue swelling about the lower extremity. There are advanced degenerative changes of the knee. There is no radiopaque foreign body. No definite subcutaneous gas. There is a moderate-sized plantar calcaneal spur. IMPRESSION: 1. No acute osseous abnormality. 2. Nonspecific soft tissue swelling about  the lower extremity which can be seen in patients with cellulitis. Electronically Signed   By: Constance Holster M.D.   On: 07/31/2018 03:47        Scheduled Meds: . ALPRAZolam  1 mg Oral TID  . [START ON 08/02/2018] Buprenorphine  1 patch Transdermal Q Sat  . gabapentin  300 mg Oral BID  . insulin aspart  0-20 Units Subcutaneous TID WC  . insulin glargine  100 Units Subcutaneous QHS  . [START ON 08/02/2018] insulin glargine  185 Units Subcutaneous Daily  . levothyroxine  75 mcg Oral Q0600  . methocarbamol  750 mg Oral TID  . [START ON 08/02/2018] penicillin g benzathine (BICILLIN-LA) IM  1.2 Million Units Intramuscular Once   And  . [START ON 08/02/2018] penicillin g benzathine (BICILLIN-LA) IM  1.2 Million Units Intramuscular Once  . rivaroxaban  20 mg Oral Daily  . sertraline  200 mg Oral QHS  . sodium chloride flush  3 mL Intravenous Once   Continuous Infusions: . sodium chloride 75 mL/hr (08/01/18 0911)  . cefTRIAXone (ROCEPHIN)  IV Stopped (07/31/18 2308)     LOS: 0 days    Time spent: 40 minutes    Irine Seal, MD [facility]  If 7PM-7AM, please contact night-coverage [email or web address] 08/01/2018, 10:12 AM

## 2018-08-02 DIAGNOSIS — E785 Hyperlipidemia, unspecified: Secondary | ICD-10-CM

## 2018-08-02 DIAGNOSIS — E039 Hypothyroidism, unspecified: Secondary | ICD-10-CM

## 2018-08-02 DIAGNOSIS — J4520 Mild intermittent asthma, uncomplicated: Secondary | ICD-10-CM

## 2018-08-02 LAB — CBC WITH DIFFERENTIAL/PLATELET
Abs Immature Granulocytes: 0.08 10*3/uL — ABNORMAL HIGH (ref 0.00–0.07)
Basophils Absolute: 0 10*3/uL (ref 0.0–0.1)
Basophils Relative: 0 %
Eosinophils Absolute: 0.2 10*3/uL (ref 0.0–0.5)
Eosinophils Relative: 2 %
HCT: 36.9 % (ref 36.0–46.0)
Hemoglobin: 10.8 g/dL — ABNORMAL LOW (ref 12.0–15.0)
Immature Granulocytes: 1 %
Lymphocytes Relative: 16 %
Lymphs Abs: 1.3 10*3/uL (ref 0.7–4.0)
MCH: 23.7 pg — ABNORMAL LOW (ref 26.0–34.0)
MCHC: 29.3 g/dL — ABNORMAL LOW (ref 30.0–36.0)
MCV: 80.9 fL (ref 80.0–100.0)
Monocytes Absolute: 0.6 10*3/uL (ref 0.1–1.0)
Monocytes Relative: 7 %
Neutro Abs: 6.1 10*3/uL (ref 1.7–7.7)
Neutrophils Relative %: 74 %
Platelets: 200 10*3/uL (ref 150–400)
RBC: 4.56 MIL/uL (ref 3.87–5.11)
RDW: 17.3 % — ABNORMAL HIGH (ref 11.5–15.5)
WBC: 8.3 10*3/uL (ref 4.0–10.5)
nRBC: 0 % (ref 0.0–0.2)

## 2018-08-02 LAB — BASIC METABOLIC PANEL
Anion gap: 7 (ref 5–15)
BUN: 13 mg/dL (ref 6–20)
CO2: 27 mmol/L (ref 22–32)
Calcium: 8.4 mg/dL — ABNORMAL LOW (ref 8.9–10.3)
Chloride: 102 mmol/L (ref 98–111)
Creatinine, Ser: 1.02 mg/dL — ABNORMAL HIGH (ref 0.44–1.00)
GFR calc Af Amer: >60 mL/min (ref >60)
GFR calc non Af Amer: >60 mL/min (ref >60)
Glucose, Bld: 210 mg/dL — ABNORMAL HIGH (ref 70–99)
Potassium: 4.2 mmol/L (ref 3.5–5.1)
Sodium: 136 mmol/L (ref 135–145)

## 2018-08-02 LAB — CULTURE, BLOOD (ROUTINE X 2): Special Requests: ADEQUATE

## 2018-08-02 LAB — GLUCOSE, CAPILLARY
Glucose-Capillary: 171 mg/dL — ABNORMAL HIGH (ref 70–99)
Glucose-Capillary: 182 mg/dL — ABNORMAL HIGH (ref 70–99)
Glucose-Capillary: 150 mg/dL — ABNORMAL HIGH (ref 70–99)

## 2018-08-02 MED ORDER — CIPROFLOXACIN HCL 750 MG PO TABS: 750.0000 mg | ORAL_TABLET | Freq: Two times a day (BID) | ORAL | 0 refills | Status: AC

## 2018-08-02 MED ORDER — FUROSEMIDE 10 MG/ML IJ SOLN
20.0000 mg | Freq: Once | INTRAMUSCULAR | Status: AC
  Administered 2018-08-02: 20 mg via INTRAVENOUS
  Filled 2018-08-02: qty 2

## 2018-08-02 MED ORDER — SENNOSIDES-DOCUSATE SODIUM 8.6-50 MG PO TABS: 1.0000 | ORAL_TABLET | Freq: Two times a day (BID) | ORAL | Status: DC

## 2018-08-02 NOTE — Progress Notes (Signed)
Patient ID: DER GAGLIANO, female   DOB: 1965-11-26, 53 y.o.   MRN: [id number]         [facility]  Date of Admission:  07/30/2018    Total days of antibiotics 4        Day 3 ceftriaxone         ASSESSMENT: Her recurrent left leg cellulitis has responded promptly to antibiotic therapy.  Given the severity and frequency of recurrences I have decided to give her IM benzathine penicillin to complete treatment and hopefully interrupt frequent recurrences.  I would suggest that we try this for several months to see if it works and allow her to get to the lymphedema clinic.  Her admission blood cultures turned positive for Serratia.  This is unlikely to be related to her cellulitis.  She may have had mild, early left pyelonephritis.   PLAN: 1. Continue ceftriaxone while she remains an inpt 2. Benzathine penicillin 1,200,000 units IM today prior to d/c (pt denies receiving thus far). Reviewed hand hygiene ad nauseum with pt to reduce risk of self-inflicted scratching to limbs. Recommended obtaining bitter apple spray for fingernail application to reduce biting of fingernails which results in jagged fingernails which causes skin micro-trauma when scratching. Also advised white cloth gloves for sleep. 3. Follow-up in our clinic with [name] FNP on 08/26/2018 4. Given sensitivities noted on Serratia in blood with cefazolin resistance, would best complete treatment of her BSI with cipro 750 mg PO BID to complete 7 days of additional treatment. Will repeat blood cxs x 2 now to confirm clearance of BSI (can follow this as an OP). Pt is OK for d/c home from an ID standpoint once nursing has administered IM bicillin.  Principal Problem:   Sepsis ([place]) Active Problems:   Hyperlipidemia associated with type 2 diabetes mellitus ([place])   Anxiety and depression   Asthma   DCIS (ductal carcinoma in situ) of breast, left, treated with BCT 11/2007   Cellulitis of left lower extremity    Obstructive sleep apnea   Diabetic neuropathy (H[facility])   Lymphedema of both lower extremities   Hypothyroidism   Pulmonary embolism ([place])   Diabetes mellitus type 2 in obese (HCC)   Chronic pain   Morbid obesity with body mass index of 60.0-69.9 in adult [facility])   Recurrent cellulitis of lower extremity   Serratia infection   Bacteremia   Scheduled Meds: . ALPRAZolam  1 mg Oral TID  . Buprenorphine  1 patch Transdermal Q Sat  . furosemide  20 mg Intravenous Once  . gabapentin  300 mg Oral BID  . insulin aspart  0-20 Units Subcutaneous TID WC  . insulin glargine  100 Units Subcutaneous QHS  . insulin glargine  185 Units Subcutaneous Daily  . levothyroxine  75 mcg Oral Q0600  . methocarbamol  750 mg Oral TID  . penicillin g benzathine (BICILLIN-LA) IM  1.2 Million Units Intramuscular Once   And  . penicillin g benzathine (BICILLIN-LA) IM  1.2 Million Units Intramuscular Once  . rivaroxaban  20 mg Oral Daily  . senna-docusate  1 tablet Oral BID  . sertraline  200 mg Oral QHS  . sodium chloride flush  3 mL Intravenous Once   Continuous Infusions: . cefTRIAXone (ROCEPHIN)  IV 2 g (08/01/18 1634)   PRN Meds:.acetaminophen **OR** acetaminophen, albuterol, fluconazole, ketorolac, ondansetron **OR** ondansetron (ZOFRAN) IV   SUBJECTIVE: Reports less warmth/heat from LT leg but swelling was slightly worse this morning (pt  did have her leg down off the bed/chair most of the night while sleeping though). Appetite improving as nausea subsides. Pt is anxious to go home today.  Review of Systems: Review of Systems  Constitutional: Negative for chills, diaphoresis and fever.  Gastrointestinal: Negative for abdominal pain, diarrhea, nausea and vomiting.  Genitourinary: Negative for dysuria and flank pain.  Musculoskeletal: Positive for joint pain.    Allergies  Allergen Reactions  . Vicodin [Hydrocodone-Acetaminophen] Nausea And Vomiting    OBJECTIVE: Vitals:   08/01/18 0416  08/01/18 0820 08/01/18 2020 08/02/18 0607  BP: 132/61 (!) 143/64 131/79 127/68  Pulse: 83 85 89 85  Resp: 13 20 18 16   Temp: 98.3 F (36.8 C) 98.1 F (36.7 C) 99.7 F (37.6 C) 98.3 F (36.8 C)  TempSrc: Oral Oral Oral Oral  SpO2: 92% 95% 93% 91%  Weight:      Height:       Body mass index is 80.11 kg/m.  Physical Exam Gen: pleasant, obese, mild distress secondary to LT leg swelling, A&Ox 3 Head: NCAT, no temporal wasting evident EENT: PERRL, EOMI, MMM, adequate dentition Neck: supple, no JVD CV: NRRR, no murmurs evident Pulm: CTA bilaterally, no wheeze or retractions Abd: soft, obese, NTND, +BS Extrems: 2+ non-pitting LT leg edema, 2+ pulses Skin: LT leg erythema now receding within demarcated area with less warmth evident, no drainage, adequate skin turgor Neuro: CN II-XII grossly intact, no focal neurologic deficits appreciated, gait was not assessed, A&Ox 3  Lab Results Lab Results  Component Value Date   WBC 8.3 08/02/2018   HGB 10.8 (L) 08/02/2018   HCT 36.9 08/02/2018   MCV 80.9 08/02/2018   PLT 200 08/02/2018    Lab Results  Component Value Date   CREATININE 1.02 (H) 08/02/2018   BUN 13 08/02/2018   NA 136 08/02/2018   K 4.2 08/02/2018   CL 102 08/02/2018   CO2 27 08/02/2018    Lab Results  Component Value Date   ALT 15 07/30/2018   AST 21 07/30/2018   ALKPHOS 98 07/30/2018   BILITOT 0.4 07/30/2018     Microbiology: Recent Results (from the past 240 hour(s))  Culture, blood (Routine x 2)     Status: Abnormal   Collection Time: 07/30/18 11:27 PM   Specimen: BLOOD  Result Value Ref Range Status   Specimen Description BLOOD LEFT ARM  Final   Special Requests   Final    BOTTLES DRAWN AEROBIC AND ANAEROBIC Blood Culture adequate volume   Culture  Setup Time   Final    GRAM NEGATIVE RODS AEROBIC BOTTLE ONLY CRITICAL RESULT CALLED TO, READ BACK BY AND VERIFIED WITH: Lanae Boast PharmD 15:35 07/31/18 ([email or web address]) Performed at [facility], [address], [place], [place] [address]    Culture SERRATIA MARCESCENS (A)  Final   Report Status 08/02/2018 FINAL  Final   Organism ID, Bacteria SERRATIA MARCESCENS  Final      Susceptibility   Serratia marcescens - MIC*    CEFAZOLIN >=64 RESISTANT Resistant     CEFEPIME <=1 SENSITIVE Sensitive     CEFTAZIDIME <=1 SENSITIVE Sensitive     CEFTRIAXONE <=1 SENSITIVE Sensitive     CIPROFLOXACIN <=0.25 SENSITIVE Sensitive     GENTAMICIN <=1 SENSITIVE Sensitive     TRIMETH/SULFA <=20 SENSITIVE Sensitive     * SERRATIA MARCESCENS  Culture, blood (Routine x 2)     Status: Abnormal   Collection Time: 07/30/18 11:27 PM   Specimen: BLOOD RIGHT  HAND  Result Value Ref Range Status   Specimen Description BLOOD RIGHT HAND  Final   Special Requests   Final    BOTTLES DRAWN AEROBIC ONLY Blood Culture results may not be optimal due to an excessive volume of blood received in culture bottles   Culture  Setup Time GRAM NEGATIVE RODS AEROBIC BOTTLE ONLY   Final   Culture (A)  Final    SERRATIA MARCESCENS SUSCEPTIBILITIES PERFORMED ON PREVIOUS CULTURE WITHIN THE LAST 5 DAYS. Performed at [facility], [place] [address] [place], [place] [address]    Report Status 08/02/2018 FINAL  Final  Blood Culture ID Panel (Reflexed)     Status: Abnormal   Collection Time: 07/30/18 11:27 PM  Result Value Ref Range Status   Enterococcus species NOT DETECTED NOT DETECTED Final   Listeria monocytogenes NOT DETECTED NOT DETECTED Final   Staphylococcus species NOT DETECTED NOT DETECTED Final   Staphylococcus aureus (BCID) NOT DETECTED NOT DETECTED Final   Streptococcus species NOT DETECTED NOT DETECTED Final   Streptococcus agalactiae NOT DETECTED NOT DETECTED Final   Streptococcus pneumoniae NOT DETECTED NOT DETECTED Final   Streptococcus pyogenes NOT DETECTED NOT DETECTED Final   Acinetobacter baumannii NOT DETECTED NOT DETECTED Final   Enterobacteriaceae species DETECTED (A) NOT DETECTED Final    Comment:  Enterobacteriaceae represent a large family of gram-negative bacteria, not a single organism. CRITICAL RESULT CALLED TO, READ BACK BY AND VERIFIED WITH: Lanae Boast PharmD 15:35 07/31/18 ([email or web address])    Enterobacter cloacae complex NOT DETECTED NOT DETECTED Final   Escherichia coli NOT DETECTED NOT DETECTED Final   Klebsiella oxytoca NOT DETECTED NOT DETECTED Final   Klebsiella pneumoniae NOT DETECTED NOT DETECTED Final   Proteus species NOT DETECTED NOT DETECTED Final   Serratia marcescens DETECTED (A) NOT DETECTED Final    Comment: CRITICAL RESULT CALLED TO, READ BACK BY AND VERIFIED WITH: Lanae Boast PharmD 15:35 07/31/18 ([email or web address])    Carbapenem resistance NOT DETECTED NOT DETECTED Final   Haemophilus influenzae NOT DETECTED NOT DETECTED Final   Neisseria meningitidis NOT DETECTED NOT DETECTED Final   Pseudomonas aeruginosa NOT DETECTED NOT DETECTED Final   Candida albicans NOT DETECTED NOT DETECTED Final   Candida glabrata NOT DETECTED NOT DETECTED Final   Candida krusei NOT DETECTED NOT DETECTED Final   Candida parapsilosis NOT DETECTED NOT DETECTED Final   Candida tropicalis NOT DETECTED NOT DETECTED Final    Comment: Performed at [facility], [place] [address] [place], [place] [address]  SARS Coronavirus 2 (CEPHEID - Performed in [place] hospital lab), Hosp Order     Status: None   Collection Time: 07/31/18  4:46 AM   Specimen: Nasopharyngeal Swab  Result Value Ref Range Status   SARS Coronavirus 2 NEGATIVE NEGATIVE Final    Comment: (NOTE) If result is NEGATIVE SARS-CoV-2 target nucleic acids are NOT DETECTED. The SARS-CoV-2 RNA is generally detectable in upper and lower  respiratory specimens during the acute phase of infection. The lowest  concentration of SARS-CoV-2 viral copies this assay can detect is 250  copies / mL. A negative result does not preclude SARS-CoV-2 infection  and should not be used as the sole basis for treatment or other  patient management decisions.  A  negative result may occur with  improper specimen collection / handling, submission of specimen other  than nasopharyngeal swab, presence of viral mutation(s) within the  areas targeted by this assay, and inadequate number of viral copies  (<250 copies /  mL). A negative result must be combined with clinical  observations, patient history, and epidemiological information. If result is POSITIVE SARS-CoV-2 target nucleic acids are DETECTED. The SARS-CoV-2 RNA is generally detectable in upper and lower  respiratory specimens dur ing the acute phase of infection.  Positive  results are indicative of active infection with SARS-CoV-2.  Clinical  correlation with patient history and other diagnostic information is  necessary to determine patient infection status.  Positive results do  not rule out bacterial infection or co-infection with other viruses. If result is PRESUMPTIVE POSTIVE SARS-CoV-2 nucleic acids MAY BE PRESENT.   A presumptive positive result was obtained on the submitted specimen  and confirmed on repeat testing.  While 2019 novel coronavirus  (SARS-CoV-2) nucleic acids may be present in the submitted sample  additional confirmatory testing may be necessary for epidemiological  and / or clinical management purposes  to differentiate between  SARS-CoV-2 and other Sarbecovirus currently known to infect humans.  If clinically indicated additional testing with an alternate test  methodology [phone]) is advised. The SARS-CoV-2 RNA is generally  detectable in upper and lower respiratory sp ecimens during the acute  phase of infection. The expected result is Negative. Fact Sheet for Patients:  StrictlyIdeas.no Fact Sheet for Healthcare Providers: BankingDealers.co.za This test is not yet approved or cleared by the [place] FDA and has been authorized for detection and/or diagnosis of SARS-CoV-2 by FDA under an Emergency Use  Authorization (EUA).  This EUA will remain in effect (meaning this test can be used) for the duration of the COVID-19 declaration under Section 564(b)(1) of the Act, 21 U.S.C. section 360bbb-3(b)(1), unless the authorization is terminated or revoked sooner. Performed at [facility], [place] [address], [place], [place] [address]   MRSA PCR Screening     Status: None   Collection Time: 07/31/18  4:55 PM   Specimen: Nasopharyngeal  Result Value Ref Range Status   MRSA by PCR NEGATIVE NEGATIVE Final    Comment:        The GeneXpert MRSA Assay (FDA approved for NASAL specimens only), is one component of a comprehensive MRSA colonization surveillance program. It is not intended to diagnose MRSA infection nor to guide or monitor treatment for MRSA infections. Performed at [facility], [place] [address] [place], [place] [address]     Kaylub Detienne N Johnda Billiot, [place] [phone] pager   [phone] cell 08/02/2018, 11:39 AM

## 2018-08-02 NOTE — Plan of Care (Signed)
Patient ready for discharge. 

## 2018-08-02 NOTE — Progress Notes (Signed)
Occupational Therapy Evaluation Patient Details Name: Cheyenne Guzman MRN: [id number] DOB: September 10, 1965 Today's Date: 08/02/2018    History of Present Illness Cheyenne Guzman a 53 y.o.femalewith medical history significant ofDM2, PE on xarelto, HLD, OSA, morbid obesity, chronic pain.  Patiient presents to the ED for recurrent cellulitis of LLE. She was admitted in May and June for the same. Cellulitis each time was associated with systemic sepsis symptoms, fever, tachycardia, etc.   Clinical Impression   PTA, pt was living at home with her husband and daughter, and reports independence with ADL/IADL and functional mobility with intermittent use of spc during in home mobility and use of Rollator for community mobility. Pt currently functioning at modified independent level with use of RW during functional mobility for pain management. Pt reports she feels close to/at baseline functioning. SpO2 94% on RA, increased wob following functional mobility. Pt will continue to benefit from acute OT services to address energy conservation strategies to maximize safety and independence with ADL and functional mobility for safe d/c home. No follow up OT recommended.     Follow Up Recommendations  No OT follow up    Equipment Recommendations  None recommended by OT    Recommendations for Other Services       Precautions / Restrictions Precautions Precautions: None Restrictions Weight Bearing Restrictions: No      Mobility Bed Mobility Overal bed mobility: Modified Independent             General bed mobility comments: HOB elevated  Transfers Overall transfer level: Modified independent Equipment used: Rolling walker (2 wheeled)             General transfer comment: use of RW for pain management     Balance Overall balance assessment: No apparent balance deficits (not formally assessed)                                         ADL either performed or  assessed with clinical judgement   ADL Overall ADL's : At baseline                                     Functional mobility during ADLs: Modified independent;Rolling walker General ADL Comments: pt completing ADL at modified independent level with use of RW for pain management;educated pt on energy conservation strategies during ADL;SpO2 94% on RA, increased wob following in room mobility;     Vision Patient Visual Report: No change from baseline       Perception     Praxis      Pertinent Vitals/Pain Pain Assessment: 0-10 Pain Score: 10-Worst pain ever Faces Pain Scale: Hurts little more Pain Location: L LE Pain Descriptors / Indicators: Sore Pain Intervention(s): Monitored during session;Limited activity within patient's tolerance;Repositioned     Hand Dominance Right   Extremity/Trunk Assessment Upper Extremity Assessment Upper Extremity Assessment: Overall WFL for tasks assessed   Lower Extremity Assessment Lower Extremity Assessment: LLE deficits/detail LLE Deficits / Details: notable edema;redness and hot to the touch LLE: Unable to fully assess due to pain LLE Sensation: decreased light touch;history of peripheral neuropathy   Cervical / Trunk Assessment Cervical / Trunk Assessment: Normal   Communication Communication Communication: No difficulties   Cognition Arousal/Alertness: Awake/alert Behavior During Therapy: WFL for tasks assessed/performed Overall Cognitive Status: Within Functional Limits  for tasks assessed                                     General Comments  discussed importance of mobility while sitting up in chair;discussed importance of elevation of LLE;LLE notable edema and skin warm to the touch    Exercises     Shoulder Instructions      Home Living Family/patient expects to be discharged to:: Private residence Living Arrangements: Spouse/significant other;Children Available Help at Discharge:  Family;Available PRN/intermittently Type of Home: House Home Access: Level entry     Home Layout: One level     Bathroom Shower/Tub: Teacher, early years/pre: Handicapped height     Home Equipment: Environmental consultant - 2 wheels;Walker - 4 wheels;Cane - single point;Tub bench   Additional Comments: pt has a pool at her home and exercises/ swims in it several hours/ day in the summer      Prior Functioning/Environment Level of Independence: Independent        Comments: Indep with household ambulation, uses rollator when outside. Has above ground pool pt tries to walk in and do exercise in daily. No MOI for R groin pain        OT Problem List: Decreased range of motion;Decreased activity tolerance;Pain;Increased edema      OT Treatment/Interventions: Self-care/ADL training;Energy conservation;Patient/family education    OT Goals(Current goals can be found in the care plan section) Acute Rehab OT Goals Patient Stated Goal: to continue exercises in her pool  OT Goal Formulation: With patient Time For Goal Achievement: 08/16/18 Potential to Achieve Goals: Good ADL Goals Additional ADL Goal #1: Pt will independently demonstrate use of 3 energy conservation strategies during ADL completion.  OT Frequency: Min 2X/week   Barriers to D/C:            Co-evaluation              AM-PAC OT "6 Clicks" Daily Activity     Outcome Measure Help from another person eating meals?: None Help from another person taking care of personal grooming?: None Help from another person toileting, which includes using toliet, bedpan, or urinal?: None Help from another person bathing (including washing, rinsing, drying)?: None Help from another person to put on and taking off regular upper body clothing?: None Help from another person to put on and taking off regular lower body clothing?: None 6 Click Score: 24   End of Session Equipment Utilized During Treatment: Rolling walker Nurse  Communication: Mobility status;Patient requests pain meds  Activity Tolerance: Patient tolerated treatment well Patient left: in chair;with call bell/phone within reach  OT Visit Diagnosis: Other abnormalities of gait and mobility (R26.89);Pain Pain - Right/Left: Left Pain - part of body: Leg                Time: 2683-4196 OT Time Calculation (min): 30 min Charges:  OT General Charges $OT Visit: 1 Visit OT Evaluation $OT Eval Moderate Complexity: 1 Mod OT Treatments $Self Care/Home Management : 8-22 mins  Dorinda Hill OTR/L Acute Rehabilitation Services Office: [phone]   Wyn Forster 08/02/2018, 9:24 AM

## 2018-08-02 NOTE — Discharge Summary (Signed)
Physician Discharge Summary  Cheyenne Guzman LNL:[id number] DOB: 1965/03/19 DOA: 07/30/2018  PCP: Cheyenne Morale, MD  Admit date: 07/30/2018 Discharge date: 08/02/2018  Time spent: 60 minutes  Recommendations for Outpatient Follow-up:  1. Follow-up with Cheyenne Guzman, ID clinic on 08/26/2018. 2. Follow-up with Cheyenne Morale, MD in 1 to 2 weeks.  On follow-up patient needs a basic metabolic profile done to follow-up on electrolytes and renal function.  Repeat blood cultures which were obtained on day of discharge will need to be followed up upon. 3. Follow-up in lymphedema clinic in 1 week or as previously scheduled.   Discharge Diagnoses:  Principal Problem:   Sepsis ([place]) Active Problems:   Recurrent cellulitis of lower extremity   Bacteremia   Hyperlipidemia associated with type 2 diabetes mellitus ([place])   Anxiety and depression   Asthma   DCIS (ductal carcinoma in situ) of breast, left, treated with BCT 11/2007   Cellulitis of left lower extremity   Obstructive sleep apnea   Diabetic neuropathy (H[facility])   Lymphedema of both lower extremities   Hypothyroidism   Pulmonary embolism (H[facility])   Diabetes mellitus type 2 in obese (H[facility])   Chronic pain   Morbid obesity with body mass index of 60.0-69.9 in adult [facility])   Cellulitis of left leg   Serratia infection   Discharge Condition: Stable and improved  Diet recommendation: Carb modified diet  Filed Weights   07/31/18 0209 07/31/18 0619  Weight: (!) 181.4 kg (!) 232 kg    History of present illness:  Per Cheyenne Guzman is a 53 y.o. female with medical history significant of DM2, PE on xarelto, HLD, OSA, morbid obesity, chronic pain.  Patient presented to the ED for recurrent cellulitis of LLE.  She was admitted in May and June for the same.  Cellulitis each time was associated with systemic sepsis symptoms, fever, tachycardia, etc.  She had been doing better, patient stated she had an ED visit with infectious  disease yesterday afternoon and seemed like her leg was doing okay. Stated later that evening around 10 PM she started developing a lot of pain in the posterior aspect of her left calf. Stated she got some stinging sensations and also some soreness in her left groin. States this is consistent with her prior episodes of cellulitis. Stated usually this was what happens, rapid onset and exponential worsening.  Associated chills, nausea.  Did take single dose of amoxicillin this afternoon (script she was given to take in case of worsening).  DVT study last admit was negative.   ED Course: Tm 103.  Tachy to 120.  Given tylenol for fever.  Started on empiric zosyn and vanc for cellulitis with sepsis.  Hospital Course:  1 sepsis secondary to recurrent left lower extremity cellulitis and Serratia bacteremia Patient had presented with recurrent left lower extremity cellulitis with fever temperature of 103, tachycardia with heart rates up to the 120s.  Patient noted to have chronic lower extremity lymphedema recently hospitalized with a bout of left leg cellulitis in May 2020 whereby she improved discharged on oral Keflex with recurrence again June 2020 treated with ceftezole and and received 1 dose of long-acting oritavancin prior to discharge on 07/15/2018.  Patient given a dose of oral amoxicillin to take with for sign of recurrence.  Patient presented back to the ED with fever chills, tachycardia and a temperature of 103.  Patient pancultured placed empirically on IV vancomycin and Zosyn.  Blood cultures which were  done came back positive for Serratia.  Due to recurrent nature of cellulitis ID was consulted who assessed the patient and patient initially had antibiotics narrowed down from IV Vanco and IV Zosyn to IV cefazolin.  Blood cultures however came back positive for Serratia and IV antibiotics were changed to IV Rocephin for both the cellulitis and bacteremia.  Per ID patient could likely  benefit from IM benzathine penicillin on a regular basis for several months to help prevent recurrences.  It was felt per ID that patient Serratia bacteremia was unlikely to be related to his cellulitis and may have been secondary to a possible early left pyelonephritis.  Patient improved clinically.  Patient received first dose of IM benzathine penicillin on day of discharge.   ID recommended patient be discharged home on ciprofloxacin 750 mg p.o. twice daily to complete 7 days of additional treatment.  Repeat blood cultures were ordered on day of discharge which will need to be followed up in the outpatient setting.  Patient will also need to follow-up in the lymphedema clinic on discharge.  Patient will follow-up with Cheyenne Guzman on 08/26/2018 in the ID clinic for further management.  Patient is also to follow-up with PCP.  Patient will be discharged in stable and improved condition.  2.  Diabetes mellitus type 2 Hemoglobin A1c of 8.4 (07/31/2018).    Blood glucose levels remained well controlled during the hospitalization.  Patient noted to have told the diabetic coordinator that she had been splitting her Lantus dose and taking it twice daily with good blood glucose control.    Patient's Lantus dose was changed 285 units in the morning and 100 units at night.  Patient was also maintained on sliding scale insulin.  Outpatient follow-up with endocrinologist as previously scheduled.    3.  Bilateral lower extremity lymphedema Outpatient follow-up in the lymphedema clinic.  4.  Morbid obesity  5.  Obstructive sleep apnea CPAP nightly  6.  History of PE Patient maintained on home regimen of Xarelto.   7.  Chronic pain Continued on home regimen of bupropion patch, Neurontin and Robaxin.  Outpatient follow-up.  8.  Hypothyroidism Patient was maintained on home dose Synthroid.  Outpatient follow-up with endocrinologist.    Procedures:  Plan films of the left tib-fib  07/31/2018   Consultations:  Infectious disease: Cheyenne Guzman 07/31/2018  Discharge Exam: Vitals:   08/01/18 2020 08/02/18 0607  BP: 131/79 127/68  Pulse: 89 85  Resp: 18 16  Temp: 99.7 F (37.6 C) 98.3 F (36.8 C)  SpO2: 93% 91%    General: NAD Cardiovascular: RRR Respiratory: CTAB  Discharge Instructions   Discharge Instructions    Diet Carb Modified   Complete by: As directed    Increase activity slowly   Complete by: As directed      Allergies as of 08/02/2018      Reactions   Vicodin [hydrocodone-acetaminophen] Nausea And Vomiting      Medication List    TAKE these medications   albuterol (2.5 MG/3ML) 0.083% nebulizer solution Commonly known as: PROVENTIL 1 VIAL IN NEBULIZER EVERY 4 HOURS AS NEEDED FOR WHEEZING What changed: See the new instructions.   albuterol 108 (90 Base) MCG/ACT inhaler Commonly known as: VENTOLIN HFA Inhale 2 puffs into the lungs every 4 (four) hours as needed for wheezing or shortness of breath. What changed: Another medication with the same name was changed. Make sure you understand how and when to take each.   ALPRAZolam 1 MG tablet  Commonly known as: XANAX TAKE 1 TABLET BY MOUTH THREE TIMES A DAY What changed:   when to take this  reasons to take this   AZO-CRANBERRY PO Take 2 tablets by mouth daily.   Buprenorphine 15 MCG/HR Ptwk Place 1 patch onto the skin every Saturday.   ciprofloxacin 750 MG tablet Commonly known as: CIPRO Take 1 tablet (750 mg total) by mouth 2 (two) times daily for 7 days. Start taking on: August 03, 2018   Contour Next Monitor w/Device Kit 1 each by Does not apply route 2 (two) times daily. To check blood sugars twice a day.   fluconazole 150 MG tablet Commonly known as: DIFLUCAN Take 1 tablet (150 mg total) by mouth every three (3) days as needed (for yeast infection).   furosemide 20 MG tablet Commonly known as: LASIX Take 1 tablet (20 mg total) by mouth daily. Take extra '20mg'$  as needed  if swelling worse. What changed: additional instructions   gabapentin 300 MG capsule Commonly known as: NEURONTIN Take 300 mg by mouth 2 (two) times daily.   glucose blood test strip Use as instructed   glucose blood test strip Commonly known as: Contour Next Test Use to test blood sugar 2 times daily   Insulin Glargine 100 UNIT/ML Solostar Pen Commonly known as: Lantus SoloStar Inject 280 Units into the skin every morning. and pen needles 3/day What changed:   how much to take  when to take this  additional instructions   levothyroxine 75 MCG tablet Commonly known as: SYNTHROID Take 1 tablet (75 mcg total) by mouth daily.   methocarbamol 750 MG tablet Commonly known as: ROBAXIN Take 1 tablet (750 mg total) by mouth 2 (two) times daily. What changed: when to take this   multivitamin with minerals Tabs tablet Take 1 tablet by mouth daily.   rivaroxaban 20 MG Tabs tablet Commonly known as: XARELTO Take 20 mg by mouth daily.   senna-docusate 8.6-50 MG tablet Commonly known as: Senokot-S Take 1 tablet by mouth 2 (two) times daily.   sertraline 100 MG tablet Commonly known as: ZOLOFT TAKE 2 TABLETS (200 MG TOTAL) BY MOUTH AT BEDTIME.      Allergies  Allergen Reactions  . Vicodin [Hydrocodone-Acetaminophen] Nausea And Vomiting   Follow-up Information    Cheyenne Morale, MD. Schedule an appointment as soon as possible for a visit in 1 week(s).   Specialty: Family Medicine Why: Follow-up in 1 to 2 weeks. Contact information: [place] [address] [phone]        [place], Guzman Follow up on 08/26/2018.   Specialties: Family Medicine, Infectious Diseases Why: Follow-up at 11:30 AM.  Please arrive 15 minutes earlier. Contact information: [place] [address] [phone]        Lymphedema clinic. Schedule an appointment as soon as possible for a visit in 1 week(s).            The  results of significant diagnostics from this hospitalization (including imaging, microbiology, ancillary and laboratory) are listed below for reference.    Significant Diagnostic Studies: US Pelvis Limited (transabdominal Only)  Result Date: 07/11/2018 CLINICAL DATA:  Right groin pain.  History of breast cancer. EXAM: LIMITED ULTRASOUND OF PELVIS TECHNIQUE: Limited transabdominal ultrasound examination of the pelvis was performed. COMPARISON:  CT 12/27/2014, 02/07/2012. FINDINGS: Complex mass is noted in the right groin. This was difficult to measure. This could represent lymphadenopathy. An abscess cannot be excluded. A  hernia cannot be excluded. A vascular lesion is less likely. No prominent internal color flow noted. IV and oral contrast-enhanced CT of the abdomen and pelvis may prove useful for further evaluation. IMPRESSION: Prominent complex mass right groin for which further evaluation with IV and oral contrast-enhanced CT of the abdomen pelvis may prove useful for further evaluation. Electronically Signed   By: Marcello Moores  Register   On: 07/11/2018 16:12   Ct Abdomen Pelvis W Contrast  Result Date: 07/12/2018 CLINICAL DATA:  53 year old female with RIGHT groin pain and possible mass identified on recent ultrasound. EXAM: CT ABDOMEN AND PELVIS WITH CONTRAST TECHNIQUE: Multidetector CT imaging of the abdomen and pelvis was performed using the standard protocol following bolus administration of intravenous contrast. Delayed images through the UPPER thighs were obtained. CONTRAST:  165m OMNIPAQUE IOHEXOL 300 MG/ML  SOLN COMPARISON:  07/11/2018 ultrasound and 12/27/2014 CT FINDINGS: Lower chest: No acute abnormality Hepatobiliary: The liver and gallbladder are unremarkable. No biliary dilatation. Pancreas: Unremarkable Spleen: Unremarkable Adrenals/Urinary Tract: The kidneys, adrenal glands and bladder are unremarkable. Stomach/Bowel: Stomach is within normal limits. Appendix appears normal. No evidence of  bowel wall thickening, distention, or inflammatory changes. Vascular/Lymphatic: Aortic atherosclerosis. No enlarged abdominal or pelvic lymph nodes. Reproductive: Status post hysterectomy. No adnexal masses. Other: Moderate skin thickening and subcutaneous thickening/edema/inflammation is noted of the soft tissues of the MEDIAL UPPER RIGHT thigh. This is compatible with the findings identified sonographically and likely represents inflammation or possibly infection. No discrete abscess or mass is noted. No ascites, pneumoperitoneum or abdominal wall hernia. Musculoskeletal: No acute or suspicious bony abnormalities. L4-L5 lumbar fusion changes noted. IMPRESSION: 1. Diffuse skin thickening and subcutaneous thickening/edema/inflammation within the MEDIAL UPPER RIGHT thigh, likely infectious or inflammatory, corresponding to the recent sonographic finding. No discrete mass or abscess. Clinical correlation/follow-up recommended. 2.  Aortic Atherosclerosis (ICD10-I70.0). Electronically Signed   By: JMargarette CanadaM.D.   On: 07/12/2018 18:18   Ct Tibia Fibula Left Wo Contrast  Result Date: 07/11/2018 CLINICAL DATA:  Redness, pain, and swelling of the left lower extremity. Recent cellulitis. Fever. Elevated white blood count. EXAM: CT OF THE LOWER LEFT EXTREMITY WITHOUT CONTRAST TECHNIQUE: Multidetector CT imaging of the left lower leg was performed according to the standard protocol. COMPARISON:  None. FINDINGS: Bones/Joint/Cartilage There are slight arthritic changes at the left knee and left ankle without appreciable joint effusions. No osteomyelitis or fracture. Muscles and Tendons Normal. Soft tissues There is extensive circumferential subcutaneous edema most prominent anteriorly. There is no definable abscess. There is thickening of the scan with a few benign calcifications in the soft tissues. Varicose veins are seen in the posteromedial aspect of the distal right thigh. IMPRESSION: No evidence of soft tissue  abscess, myositis or osteomyelitis or joint effusions. Extensive subcutaneous edema in the lower leg, nonspecific. This could represent cellulitis or benign edema. Electronically Signed   By: JLorriane ShireM.D.   On: 07/11/2018 12:20   Dg Tibia/fibula Left Port  Result Date: 07/31/2018 CLINICAL DATA:  Cellulitis EXAM: PORTABLE LEFT TIBIA AND FIBULA - 2 VIEW COMPARISON:  None. FINDINGS: There is no acute displaced fracture or dislocation. There is nonspecific soft tissue swelling about the lower extremity. There are advanced degenerative changes of the knee. There is no radiopaque foreign body. No definite subcutaneous gas. There is a moderate-sized plantar calcaneal spur. IMPRESSION: 1. No acute osseous abnormality. 2. Nonspecific soft tissue swelling about the lower extremity which can be seen in patients with cellulitis. Electronically Signed   By: CHarrell Gave  Green M.D.   On: 07/31/2018 03:47   Dg Hip Unilat With Pelvis 2-3 Views Right  Result Date: 07/09/2018 CLINICAL DATA:  Right groin pain for 2 days. Cellulitis in the left lower leg and sepsis. EXAM: DG HIP (WITH OR WITHOUT PELVIS) 2-3V RIGHT COMPARISON:  None. FINDINGS: There is no evidence of hip fracture or dislocation. Mild osteoarthrosis of the hip joints with acetabular osteophytosis. IMPRESSION: No acute fracture or dislocation identified. Electronically Signed   By: Kristine Garbe M.D.   On: 07/09/2018 22:53   Vas [place] Lower Extremity Venous (dvt)  Result Date: 07/11/2018  Lower Venous Study Indications: Pain, Edema, and Erythema. Other Indications: Cellulitus left lower extremity. Risk Factors: History of PE in February 2020. Limitations: Body habitus 399 lbs. Comparison Study: No comparison study Performing Technologist: Toma Copier RVS  Examination Guidelines: A complete evaluation includes B-mode imaging, spectral Doppler, color Doppler, and power Doppler as needed of all accessible portions of each vessel. Bilateral  testing is considered an integral part of a complete examination. Limited examinations for reoccurring indications may be performed as noted.  +-----+---------------+---------+-----------+----------+-------+ RIGHTCompressibilityPhasicitySpontaneityPropertiesSummary +-----+---------------+---------+-----------+----------+-------+ CFV  Full           Yes      Yes                          +-----+---------------+---------+-----------+----------+-------+ SFJ  Full                                                 +-----+---------------+---------+-----------+----------+-------+   +---------+---------------+---------+-----------+----------+-------------------+ LEFT     CompressibilityPhasicitySpontaneityPropertiesSummary             +---------+---------------+---------+-----------+----------+-------------------+ CFV      Full                                                             +---------+---------------+---------+-----------+----------+-------------------+ SFJ      Full                                                             +---------+---------------+---------+-----------+----------+-------------------+ FV Prox  Full                                                             +---------+---------------+---------+-----------+----------+-------------------+ FV Mid   Full                                                             +---------+---------------+---------+-----------+----------+-------------------+ FV Distal  Not visualized      +---------+---------------+---------+-----------+----------+-------------------+ PFV                                                   Not visualized      +---------+---------------+---------+-----------+----------+-------------------+ POP      Full                                                              +---------+---------------+---------+-----------+----------+-------------------+ PTV                                                   Not fully                                                                 visualized due to                                                         body habitus and                                                          pain                +---------+---------------+---------+-----------+----------+-------------------+   Left Technical Findings: Not visualized segments include multiple segments of the veins. Unable to visualize the peroneal or the distal femoral or profunda femoris. Severely limited and inconclusive. Enlargement of the inguinal lymph nodes noted.   Summary: Right: There is no obvious evidence of a common femoral vein obstruction. Left: There is no obvious evidence of deep vein thrombosis in the segments of the veins able to be imaged in the lower extremity. However, portions of this examination were limited- see technologist comments above.  *See table(s) above for measurements and observations. Electronically signed by Monica Martinez MD on 07/11/2018 at 4:50:35 PM.    Final     Microbiology: Recent Results (from the past 240 hour(s))  Culture, blood (Routine x 2)     Status: Abnormal   Collection Time: 07/30/18 11:27 PM   Specimen: BLOOD  Result Value Ref Range Status   Specimen Description BLOOD LEFT ARM  Final   Special Requests   Final    BOTTLES DRAWN AEROBIC AND ANAEROBIC Blood Culture adequate volume   Culture  Setup Time   Final    GRAM NEGATIVE RODS AEROBIC BOTTLE ONLY CRITICAL RESULT CALLED TO, READ BACK BY AND VERIFIED WITH: Lanae Boast PharmD 15:35  07/31/18 ([email or web address]) Performed at [facility], [place] [address], [place], [place] [address]    Culture SERRATIA MARCESCENS (A)  Final   Report Status 08/02/2018 FINAL  Final   Organism ID, Bacteria SERRATIA MARCESCENS  Final      Susceptibility   Serratia  marcescens - MIC*    CEFAZOLIN >=64 RESISTANT Resistant     CEFEPIME <=1 SENSITIVE Sensitive     CEFTAZIDIME <=1 SENSITIVE Sensitive     CEFTRIAXONE <=1 SENSITIVE Sensitive     CIPROFLOXACIN <=0.25 SENSITIVE Sensitive     GENTAMICIN <=1 SENSITIVE Sensitive     TRIMETH/SULFA <=20 SENSITIVE Sensitive     * SERRATIA MARCESCENS  Culture, blood (Routine x 2)     Status: Abnormal   Collection Time: 07/30/18 11:27 PM   Specimen: BLOOD RIGHT HAND  Result Value Ref Range Status   Specimen Description BLOOD RIGHT HAND  Final   Special Requests   Final    BOTTLES DRAWN AEROBIC ONLY Blood Culture results may not be optimal due to an excessive volume of blood received in culture bottles   Culture  Setup Time GRAM NEGATIVE RODS AEROBIC BOTTLE ONLY   Final   Culture (A)  Final    SERRATIA MARCESCENS SUSCEPTIBILITIES PERFORMED ON PREVIOUS CULTURE WITHIN THE LAST 5 DAYS. Performed at [facility], [place] [address] [place], [place] [address]    Report Status 08/02/2018 FINAL  Final  Blood Culture ID Panel (Reflexed)     Status: Abnormal   Collection Time: 07/30/18 11:27 PM  Result Value Ref Range Status   Enterococcus species NOT DETECTED NOT DETECTED Final   Listeria monocytogenes NOT DETECTED NOT DETECTED Final   Staphylococcus species NOT DETECTED NOT DETECTED Final   Staphylococcus aureus (BCID) NOT DETECTED NOT DETECTED Final   Streptococcus species NOT DETECTED NOT DETECTED Final   Streptococcus agalactiae NOT DETECTED NOT DETECTED Final   Streptococcus pneumoniae NOT DETECTED NOT DETECTED Final   Streptococcus pyogenes NOT DETECTED NOT DETECTED Final   Acinetobacter baumannii NOT DETECTED NOT DETECTED Final   Enterobacteriaceae species DETECTED (A) NOT DETECTED Final    Comment: Enterobacteriaceae represent a large family of gram-negative bacteria, not a single organism. CRITICAL RESULT CALLED TO, READ BACK BY AND VERIFIED WITH: Lanae Boast PharmD 15:35 07/31/18 ([email or web address])     Enterobacter cloacae complex NOT DETECTED NOT DETECTED Final   Escherichia coli NOT DETECTED NOT DETECTED Final   Klebsiella oxytoca NOT DETECTED NOT DETECTED Final   Klebsiella pneumoniae NOT DETECTED NOT DETECTED Final   Proteus species NOT DETECTED NOT DETECTED Final   Serratia marcescens DETECTED (A) NOT DETECTED Final    Comment: CRITICAL RESULT CALLED TO, READ BACK BY AND VERIFIED WITH: Lanae Boast PharmD 15:35 07/31/18 ([email or web address])    Carbapenem resistance NOT DETECTED NOT DETECTED Final   Haemophilus influenzae NOT DETECTED NOT DETECTED Final   Neisseria meningitidis NOT DETECTED NOT DETECTED Final   Pseudomonas aeruginosa NOT DETECTED NOT DETECTED Final   Candida albicans NOT DETECTED NOT DETECTED Final   Candida glabrata NOT DETECTED NOT DETECTED Final   Candida krusei NOT DETECTED NOT DETECTED Final   Candida parapsilosis NOT DETECTED NOT DETECTED Final   Candida tropicalis NOT DETECTED NOT DETECTED Final    Comment: Performed at [facility], [place] [address], [place], [place] [address]  SARS Coronavirus 2 (CEPHEID - Performed in [facility] hospital lab), Hosp Order     Status: None   Collection Time: 07/31/18  4:46 AM  Specimen: Nasopharyngeal Swab  Result Value Ref Range Status   SARS Coronavirus 2 NEGATIVE NEGATIVE Final    Comment: (NOTE) If result is NEGATIVE SARS-CoV-2 target nucleic acids are NOT DETECTED. The SARS-CoV-2 RNA is generally detectable in upper and lower  respiratory specimens during the acute phase of infection. The lowest  concentration of SARS-CoV-2 viral copies this assay can detect is 250  copies / mL. A negative result does not preclude SARS-CoV-2 infection  and should not be used as the sole basis for treatment or other  patient management decisions.  A negative result may occur with  improper specimen collection / handling, submission of specimen other  than nasopharyngeal swab, presence of viral mutation(s) within the  areas targeted by this  assay, and inadequate number of viral copies  (<250 copies / mL). A negative result must be combined with clinical  observations, patient history, and epidemiological information. If result is POSITIVE SARS-CoV-2 target nucleic acids are DETECTED. The SARS-CoV-2 RNA is generally detectable in upper and lower  respiratory specimens dur ing the acute phase of infection.  Positive  results are indicative of active infection with SARS-CoV-2.  Clinical  correlation with patient history and other diagnostic information is  necessary to determine patient infection status.  Positive results do  not rule out bacterial infection or co-infection with other viruses. If result is PRESUMPTIVE POSTIVE SARS-CoV-2 nucleic acids MAY BE PRESENT.   A presumptive positive result was obtained on the submitted specimen  and confirmed on repeat testing.  While 2019 novel coronavirus  (SARS-CoV-2) nucleic acids may be present in the submitted sample  additional confirmatory testing may be necessary for epidemiological  and / or clinical management purposes  to differentiate between  SARS-CoV-2 and other Sarbecovirus currently known to infect humans.  If clinically indicated additional testing with an alternate test  methodology [phone]) is advised. The SARS-CoV-2 RNA is generally  detectable in upper and lower respiratory sp ecimens during the acute  phase of infection. The expected result is Negative. Fact Sheet for Patients:  StrictlyIdeas.no Fact Sheet for Healthcare Providers: BankingDealers.co.za This test is not yet approved or cleared by the [place] FDA and has been authorized for detection and/or diagnosis of SARS-CoV-2 by FDA under an Emergency Use Authorization (EUA).  This EUA will remain in effect (meaning this test can be used) for the duration of the COVID-19 declaration under Section 564(b)(1) of the Act, 21 U.S.C. section 360bbb-3(b)(1),  unless the authorization is terminated or revoked sooner. Performed at [facility], [place] [address] [place], [place] [address]   MRSA PCR Screening     Status: None   Collection Time: 07/31/18  4:55 PM   Specimen: Nasopharyngeal  Result Value Ref Range Status   MRSA by PCR NEGATIVE NEGATIVE Final    Comment:        The GeneXpert MRSA Assay (FDA approved for NASAL specimens only), is one component of a comprehensive MRSA colonization surveillance program. It is not intended to diagnose MRSA infection nor to guide or monitor treatment for MRSA infections. Performed at [facility], [place] [address] [place],  [address]      Labs: Basic Metabolic Panel: Recent Labs  Lab 07/30/18 2326 07/31/18 0455 08/01/18 0812 08/02/18 0323  NA 137 135 138 136  K 4.3 4.6 4.1 4.2  CL 100 102 105 102  CO2 _0 GLUCOSE 318* 196* 168* 210*  BUN _1 CREATININE 0.98  1.15* 0.97 1.02*  CALCIUM 9.0 8.6* 8.2* 8.4*  MG  --   --  1.9  --    Liver Function Tests: Recent Labs  Lab 07/30/18 2326  AST 21  ALT 15  ALKPHOS 98  BILITOT 0.4  PROT 7.8  ALBUMIN 3.7   No results for input(s): LIPASE, AMYLASE in the last 168 hours. No results for input(s): AMMONIA in the last 168 hours. CBC: Recent Labs  Lab 07/30/18 2326 07/31/18 0455 08/01/18 0812 08/02/18 0323  WBC 8.8 13.2* 9.9 8.3  NEUTROABS 7.5  --  7.7 6.1  HGB 12.7 11.8* 10.9* 10.8*  HCT 42.8 40.6 37.4 36.9  MCV 80.6 81.2 81.8 80.9  PLT 240 220 185 200   Cardiac Enzymes: No results for input(s): CKTOTAL, CKMB, CKMBINDEX, TROPONINI in the last 168 hours. BNP: BNP (last 3 results) Recent Labs    03/11/18 2021 06/19/18 1604  BNP 74.5 193.4*    ProBNP (last 3 results) No results for input(s): PROBNP in the last 8760 hours.  CBG: Recent Labs  Lab 08/01/18 1214 08/01/18 1711 08/01/18 2241 08/02/18 0613 08/02/18 1103  GLUCAP 219* 184* 227* 171* 182*       Signed:  Irine Seal MD.  [facility] 08/02/2018, 3:26 PM

## 2018-08-04 ENCOUNTER — Other Ambulatory Visit: Payer: Self-pay | Admitting: Family Medicine

## 2018-08-04 ENCOUNTER — Encounter: Payer: Self-pay | Admitting: Family Medicine

## 2018-08-06 ENCOUNTER — Encounter: Payer: Self-pay | Admitting: Family Medicine

## 2018-08-06 ENCOUNTER — Other Ambulatory Visit: Payer: Self-pay

## 2018-08-06 ENCOUNTER — Ambulatory Visit (INDEPENDENT_AMBULATORY_CARE_PROVIDER_SITE_OTHER): Payer: Medicare Other | Admitting: Family Medicine

## 2018-08-06 DIAGNOSIS — A419 Sepsis, unspecified organism: Secondary | ICD-10-CM | POA: Diagnosis not present

## 2018-08-06 DIAGNOSIS — L03116 Cellulitis of left lower limb: Secondary | ICD-10-CM | POA: Diagnosis not present

## 2018-08-06 DIAGNOSIS — A498 Other bacterial infections of unspecified site: Secondary | ICD-10-CM

## 2018-08-07 ENCOUNTER — Telehealth: Payer: Self-pay

## 2018-08-07 ENCOUNTER — Encounter: Payer: Self-pay | Admitting: Family Medicine

## 2018-08-07 LAB — CULTURE, BLOOD (ROUTINE X 2)
Culture: NO GROWTH
Culture: NO GROWTH

## 2018-08-07 NOTE — Telephone Encounter (Signed)
Dr Sarajane Jews saw Ms. Rittenberry yesterday for a hospital follow up and would like to speak with Dr Megan Salon regarding this patient.   [place], RN

## 2018-08-07 NOTE — Progress Notes (Signed)
Subjective:    Patient ID: Cheyenne Guzman, female    DOB: 09-May-1965, 53 y.o.   MRN: [id number]  HPI Here to follow up a hospital stay from 07-30-18 to 08-02-18 for recurrent sepsis from a cellulitis in the left leg. She has had 3 such hospitalizations for the same thing in the past 3 months. She presented with her usual symptoms of severe pain in the left leg and fever. She had a temp of 104 degrees at home and this was down to 103 degrees at admission. Blood cultures grew a Serratia species and after she was given several different IV antibiotics, she was given Rocephin the last few days. A plain Xray of the tibia/fibula was unremarkable, as was a CT of the left lower leg a few weeks ago. She was sent home on Cipro and tomorrow will be the last day of that. She feels better in some ways, but the pain in the left leg is coming back, and last night she had a fever of 100.2 degrees. Today she has been staying around 99 degrees. She is very concerned that the infection is still present. She is scheduled to follow up with Infectious Disease on 08-26-18. Virtual Visit via Video Note  I connected with the patient on 08/07/18 at  3:30 PM EDT by a video enabled telemedicine application and verified that I am speaking with the correct person using two identifiers.  Location patient: home Location provider:work or home office Persons participating in the virtual visit: patient, provider  I discussed the limitations of evaluation and management by telemedicine and the availability of in person appointments. The patient expressed understanding and agreed to proceed.   HPI:    ROS: See pertinent positives and negatives per HPI.  Past Medical History:  Diagnosis Date  . Anxiety    per pt. - mild   . Arthritis    back & ankles   . Asthma    seasonal   . Cancer [facility])    breast, sees Dr. Jana Hakim , Left - 11/2007  . Complication of anesthesia 2013   severe muscle spasms-sore-no doc  . Depression   .  Diabetes mellitus    sees Dr. Chalmers Cater, diagnosed 2003  . HA (headache)   . Hearing loss    Went to [facility] and Throat,Dr [facility]  . Hyperlipidemia   . Kidney infection    - hosp. [facility]- septic- 10/2009  . Low back pain   . Peripheral neuropathy    R sided numbness- face & jaw  . Sleep apnea 9/15   severe-just started cpap-doing well  . TIA (transient ischemic attack)    in 2007,2008, and 2011  . Tinnitus of right ear   . Urosepsis 08-2009   with Klebsiella    Past Surgical History:  Procedure Laterality Date  . ABDOMINAL HYSTERECTOMY  08/2004  . BREAST BIOPSY  06/22/2011   Procedure: BREAST BIOPSY;  Surgeon: Stark Klein, MD;  Location: [place];  Service: General;  Laterality: Right;  . BREAST SURGERY  11/2007   left ductal carcinoma in situ  . CARPAL TUNNEL RELEASE Right 10/13/2013   Procedure: RIGHT CARPAL TUNNEL RELEASE;  Surgeon: Hessie Dibble, MD;  Location: [place];  Service: Orthopedics;  Laterality: Right;  . CARPAL TUNNEL RELEASE Left 11/24/2013   Procedure: LEFT CARPAL TUNNEL RELEASE WITH CYST EXCISION ;  Surgeon: Hessie Dibble, MD;  Location: [place];  Service: Orthopedics;  Laterality: Left;  .  EAR CYST EXCISION Left 11/24/2013   Procedure: CYST REMOVAL;  Surgeon: Hessie Dibble, MD;  Location: [place];  Service: Orthopedics;  Laterality: Left;  . FOOT SURGERY  2000   plantar fasciitis-left  . LUMBAR DISC SURGERY  03-29-14   Fusion, revision 04-27-14 L4-5 [place] with graft  . NERVE GRAFT  06-01-08   cadaver graft to right inferior alveolar nerve  in [place]    Family History  Problem Relation Age of Onset  . Cancer Mother        breast  . Cancer Father        lung  . Diabetes Father   . Cancer Sister        colon  . Cancer Maternal Aunt        breast - both  . Diabetes Sister   . Anesthesia problems Neg Hx      Current Outpatient Medications:  .  albuterol (PROVENTIL  HFA;VENTOLIN HFA) 108 (90 Base) MCG/ACT inhaler, Inhale 2 puffs into the lungs every 4 (four) hours as needed for wheezing or shortness of breath., Disp: 1 Inhaler, Rfl: 11 .  albuterol (PROVENTIL) (2.5 MG/3ML) 0.083% nebulizer solution, 1 VIAL IN NEBULIZER EVERY 4 HOURS AS NEEDED FOR WHEEZING (Patient taking differently: Take 2.5 mg by nebulization every 4 (four) hours as needed for wheezing. ), Disp: 75 mL, Rfl: 1 .  ALPRAZolam (XANAX) 1 MG tablet, TAKE 1 TABLET BY MOUTH THREE TIMES A DAY (Patient taking differently: Take 1 mg by mouth 3 (three) times daily as needed for anxiety. ), Disp: 90 tablet, Rfl: 5 .  AZO-CRANBERRY PO, Take 2 tablets by mouth daily., Disp: , Rfl:  .  Blood Glucose Monitoring Suppl (CONTOUR NEXT MONITOR) w/Device KIT, 1 each by Does not apply route 2 (two) times daily. To check blood sugars twice a day., Disp: 1 kit, Rfl: 0 .  Buprenorphine 15 MCG/HR PTWK, Place 1 patch onto the skin every Saturday. , Disp: , Rfl: 0 .  ciprofloxacin (CIPRO) 750 MG tablet, Take 1 tablet (750 mg total) by mouth 2 (two) times daily for 7 days., Disp: 14 tablet, Rfl: 0 .  fluconazole (DIFLUCAN) 150 MG tablet, Take 1 tablet (150 mg total) by mouth every three (3) days as needed (for yeast infection)., Disp: 5 tablet, Rfl: 1 .  furosemide (LASIX) 20 MG tablet, Take 1 tablet (20 mg total) by mouth daily. Take extra 83m as needed if swelling worse. (Patient taking differently: Take 20 mg by mouth daily. ), Disp: 30 tablet, Rfl: 4 .  gabapentin (NEURONTIN) 300 MG capsule, Take 300 mg by mouth 2 (two) times daily. , Disp: , Rfl:  .  glucose blood (CONTOUR NEXT TEST) test strip, Use to test blood sugar 2 times daily, Disp: 100 each, Rfl: 12 .  glucose blood test strip, Use as instructed, Disp: 100 each, Rfl: 0 .  Insulin Glargine (LANTUS SOLOSTAR) 100 UNIT/ML Solostar Pen, Inject 280 Units into the skin every morning. and pen needles 3/day (Patient taking differently: Inject 285 Units into the skin at  bedtime. ), Disp: 30 pen, Rfl: 11 .  levothyroxine (SYNTHROID, LEVOTHROID) 75 MCG tablet, Take 1 tablet (75 mcg total) by mouth daily., Disp: 90 tablet, Rfl: 3 .  methocarbamol (ROBAXIN) 750 MG tablet, Take 1 tablet (750 mg total) by mouth 2 (two) times daily. (Patient taking differently: Take 750 mg by mouth 3 (three) times daily. ), Disp: 60 tablet, Rfl: 5 .  Multiple Vitamin (MULTIVITAMIN WITH  MINERALS) TABS tablet, Take 1 tablet by mouth daily., Disp: , Rfl:  .  rivaroxaban (XARELTO) 20 MG TABS tablet, Take 20 mg by mouth daily., Disp: , Rfl:  .  senna-docusate (SENOKOT-S) 8.6-50 MG tablet, Take 1 tablet by mouth 2 (two) times daily., Disp:  , Rfl:  .  sertraline (ZOLOFT) 100 MG tablet, TAKE 2 TABLETS (200 MG TOTAL) BY MOUTH AT BEDTIME., Disp: 180 tablet, Rfl: 3  EXAM:  VITALS per patient if applicable:  GENERAL: alert, oriented, appears well and in no acute distress  HEENT: atraumatic, conjunttiva clear, no obvious abnormalities on inspection of external nose and ears  NECK: normal movements of the head and neck  LUNGS: on inspection no signs of respiratory distress, breathing rate appears normal, no obvious gross SOB, gasping or wheezing  CV: no obvious cyanosis  MS: moves all visible extremities without noticeable abnormality  PSYCH/NEURO: pleasant and cooperative, no obvious depression or anxiety, speech and thought processing grossly intact  ASSESSMENT AND PLAN: She is following up on sepsis caused by recurrent cellulitis of the left leg, and she is still febrile. I am concerned that she has an underlying osteomyelitis, and it seems she would benefit from long term IV antibiotics via a PICC line. I will speak to Infectious Disease in the morning to discuss this.  [name], MD  Discussed the following assessment and plan:  No diagnosis found.     I discussed the assessment and treatment plan with the patient. The patient was provided an opportunity to ask questions and  all were answered. The patient agreed with the plan and demonstrated an understanding of the instructions.   The patient was advised to call back or seek an in-person evaluation if the symptoms worsen or if the condition fails to improve as anticipated.    Review of Systems     Objective:   Physical Exam        Assessment & Plan:

## 2018-08-11 ENCOUNTER — Telehealth: Payer: Self-pay | Admitting: *Deleted

## 2018-08-11 ENCOUNTER — Encounter: Payer: Self-pay | Admitting: Family Medicine

## 2018-08-11 ENCOUNTER — Telehealth: Payer: Self-pay | Admitting: Internal Medicine

## 2018-08-11 NOTE — Telephone Encounter (Signed)
Agree, pictures would help.

## 2018-08-11 NOTE — Telephone Encounter (Signed)
COVID-19 Pre-Screening Questions:08/12/18  Do you currently have a fever (>100 F), chills or unexplained body aches? NO  Are you currently experiencing new cough, shortness of breath, sore throat, runny nose? NO   Have you recently travelled outside the state of [place] in the last 14 days? NO    Have you been in contact with someone that is currently pending confirmation of Covid19 testing or has been confirmed to have the [place] virus?  NO   **If the patient answers NO to ALL questions -  advise the patient to please call the clinic before coming to the office should any symptoms develop.

## 2018-08-11 NOTE — Telephone Encounter (Signed)
Last filled 05/28/17 Last OV 08/06/2018  Ok to refill?

## 2018-08-11 NOTE — Telephone Encounter (Signed)
Patient called worried because she sees Megan Salon 08/12/18 and goes to the lymphedema clinic to have her legs wrapped and it was an emergency appointment set up by Dr Sarajane Jews office. Advised her to have them take some pictures of her legs before they wrap them up and that should be good enough. She is concerned because if she does not go today they can not see her for another 2 weeks. Advised will let the provider know.

## 2018-08-12 ENCOUNTER — Ambulatory Visit: Payer: Medicare Other | Admitting: Internal Medicine

## 2018-08-12 ENCOUNTER — Other Ambulatory Visit: Payer: Self-pay

## 2018-08-12 DIAGNOSIS — L03119 Cellulitis of unspecified part of limb: Secondary | ICD-10-CM

## 2018-08-12 DIAGNOSIS — A498 Other bacterial infections of unspecified site: Secondary | ICD-10-CM | POA: Diagnosis not present

## 2018-08-12 MED ORDER — LEVOTHYROXINE SODIUM 75 MCG PO TABS: 75.0000 ug | ORAL_TABLET | Freq: Every day | ORAL | 3 refills | Status: DC

## 2018-08-12 NOTE — Telephone Encounter (Signed)
I sent in the thyroid refills

## 2018-08-12 NOTE — Assessment & Plan Note (Signed)
She has chronic lymphedema of her lower leg complicated by recurrent cellulitis.  I suggest continuing intermittent IM benzathine penicillin every 3 to 4 weeks for the next few months while she becomes established in the lymphedema clinic.  She does not have any evidence of active infection at this time.  I do not feel that she needs CT or MRI scanning to look for evidence of deep infection.  She will follow-up with my partner, [name] FNP, in 2 weeks.

## 2018-08-12 NOTE — Progress Notes (Signed)
[place] for Infectious Disease  Patient Active Problem List   Diagnosis Date Noted   Recurrent cellulitis of lower extremity 07/31/2018    Priority: High   Serratia infection 07/31/2018    Priority: High   Sepsis ([place]) 06/19/2018    Priority: High   Cellulitis of left lower extremity 07/03/2012    Priority: High   Morbid obesity with body mass index of 60.0-69.9 in adult [facility])    Chronic pain 06/19/2018   Pulmonary embolism ([place]) 03/12/2018   Diabetes mellitus type 2 in obese ([place]) 03/12/2018   Hypothyroidism 08/14/2017   Lymphedema of both lower extremities 10/18/2015   Diabetic neuropathy ([place]) 01/07/2014   Congenital spondylolysis of lumbosacral region 11/02/2013   Chronic LBP 10/08/2013   Obstructive sleep apnea 07/31/2013   Hearing loss 06/23/2013   DCIS (ductal carcinoma in situ) of breast, left, treated with BCT 11/2007 02/19/2011   Asthma 02/20/2008   Hyperlipidemia associated with type 2 diabetes mellitus ([place]) 11/26/2006   Anxiety and depression 11/26/2006    Patient's Medications  New Prescriptions   No medications on file  Previous Medications   ALBUTEROL (PROVENTIL HFA;VENTOLIN HFA) 108 (90 BASE) MCG/ACT INHALER    Inhale 2 puffs into the lungs every 4 (four) hours as needed for wheezing or shortness of breath.   ALBUTEROL (PROVENTIL) (2.5 MG/3ML) 0.083% NEBULIZER SOLUTION    1 VIAL IN NEBULIZER EVERY 4 HOURS AS NEEDED FOR WHEEZING   ALPRAZOLAM (XANAX) 1 MG TABLET    TAKE 1 TABLET BY MOUTH THREE TIMES A DAY   AZO-CRANBERRY PO    Take 2 tablets by mouth daily.   BLOOD GLUCOSE MONITORING SUPPL (CONTOUR NEXT MONITOR) W/DEVICE KIT    1 each by Does not apply route 2 (two) times daily. To check blood sugars twice a day.   BUPRENORPHINE 15 MCG/HR PTWK    Place 1 patch onto the skin every Saturday.    FLUCONAZOLE (DIFLUCAN) 150 MG TABLET    Take 1 tablet (150 mg total) by mouth every three (3) days as needed (for yeast infection).     FUROSEMIDE (LASIX) 20 MG TABLET    Take 1 tablet (20 mg total) by mouth daily. Take extra 28m as needed if swelling worse.   GABAPENTIN (NEURONTIN) 300 MG CAPSULE    Take 300 mg by mouth 2 (two) times daily.    GLUCOSE BLOOD (CONTOUR NEXT TEST) TEST STRIP    Use to test blood sugar 2 times daily   GLUCOSE BLOOD TEST STRIP    Use as instructed   INSULIN GLARGINE (LANTUS SOLOSTAR) 100 UNIT/ML SOLOSTAR PEN    Inject 280 Units into the skin every morning. and pen needles 3/day   LEVOTHYROXINE (SYNTHROID) 75 MCG TABLET    Take 1 tablet (75 mcg total) by mouth daily.   METHOCARBAMOL (ROBAXIN) 750 MG TABLET    Take 1 tablet (750 mg total) by mouth 2 (two) times daily.   MULTIPLE VITAMIN (MULTIVITAMIN WITH MINERALS) TABS TABLET    Take 1 tablet by mouth daily.   RIVAROXABAN (XARELTO) 20 MG TABS TABLET    Take 20 mg by mouth daily.   SENNA-DOCUSATE (SENOKOT-S) 8.6-50 MG TABLET    Take 1 tablet by mouth 2 (two) times daily.   SERTRALINE (ZOLOFT) 100 MG TABLET    TAKE 2 TABLETS (200 MG TOTAL) BY MOUTH AT BEDTIME.  Modified Medications   No medications on file  Discontinued Medications   No medications on  file    Subjective: Cheyenne Guzman is in for her hospital follow-up visit.  She has a history of diabetes, morbid obesity and PE.  She also has a history of chronic lower extremity lymphedema complicated by recurrent lower extremity cellulitis.  She was hospitalized with a bout of left leg cellulitis in 2014.  She was treated as an outpatient for right leg cellulitis in 2019.  She was hospitalized again with left leg cellulitis in May of this year. She improved and was discharged on oral cephalexin.    She was admitted again last month and treated with cefazolin for recurrent left leg cellulitis and given 1 dose of long-acting oritavancin prior to discharge on 07/14/2018.  She was also given a prescription for oral amoxicillin to take at the first sign of future recurrences.    She had an E visit with my  partner, [name] FNP on 07/30/2018 and was doing well.    Later that day she had sudden onset of fever and chills.  She pulled back a blanket over her left leg and it was swollen and bright red again.  She took 1 dose of amoxicillin but continued to worsen leading to readmission the following day.    She had also noted some cloudiness of her urine and mild left flank pain around that same time.  She was started on broad empiric antibiotic therapy with vancomycin and piperacillin tazobactam and felt much better the following morning.  Her cellulitis improved promptly.  Her blood cultures grew Serratia.  Antibiotics were narrowed to ceftriaxone.  Because of her frequent and severe recurrences of cellulitis I gave her 1 dose of benzathine penicillin on 08/02/2018.  She was discharged on oral ciprofloxacin and completed 7 days of total therapy for Serratia bacteremia that was felt to be due to early pyelonephritis.  No urine culture was obtained while she was hospitalized.  She had a low-grade temperature up to 100.2.  For 2 to 3 days after discharge but is feeling much better now.  She is still having some pain in her left anterior shin but says that overall the swelling and erythema are much improved.  Her cloudy urine and flank pain have resolved.  She has had some residual desquamation of her left leg left over from her last bout of cellulitis.  She has been using moisturizing cream.  Sometime in the past few months she tried wearing compression stockings that she bought on [employer].  She did not have any problem getting them on and was fairly comfortable wearing them but developed a severe burning sensation in her skin when she took them off so she stopped using them.    Yesterday she was seen for the first time in the lymphedema clinic and had her leg wrapped.  She took pictures of her leg before the dressing was applied and there was no evidence of any residual cellulitis.  Her physical therapist is  recommending starting lymphedema pump therapy soon.  Review of Systems: Review of Systems  Constitutional: Negative for chills, diaphoresis and fever.  Genitourinary: Negative for dysuria, flank pain, frequency and urgency.  Musculoskeletal: Positive for joint pain.    Past Medical History:  Diagnosis Date   Anxiety    per pt. - mild    Arthritis    back & ankles    Asthma    seasonal    Cancer [facility])    breast, sees Dr. Jana Hakim , Left - [id number]   Complication of anesthesia 2013  severe muscle spasms-sore-no doc   Depression    Diabetes mellitus    sees Dr. Chalmers Cater, diagnosed 2003   HA (headache)    Hearing loss    Went to [facility] and Throat,Dr Laing,both ears   Hyperlipidemia    Kidney infection    - hosp. [facility]- septic- 10/2009   Low back pain    Peripheral neuropathy    R sided numbness- face & jaw   Sleep apnea 9/15   severe-just started cpap-doing well   TIA (transient ischemic attack)    in 2007,2008, and 2011   Tinnitus of right ear    Urosepsis 08-2009   with Klebsiella    Social History   Tobacco Use   Smoking status: Former Smoker    Quit date: 02/18/1997    Years since quitting: 21.4   Smokeless tobacco: Never Used  Substance Use Topics   Alcohol use: Yes    Comment: rare   Drug use: No    Family History  Problem Relation Age of Onset   Cancer Mother        breast   Cancer Father        lung   Diabetes Father    Cancer Sister        colon   Cancer Maternal Aunt        breast - both   Diabetes Sister    Anesthesia problems Neg Hx     Allergies  Allergen Reactions   Vicodin [Hydrocodone-Acetaminophen] Nausea And Vomiting    Objective: Vitals:   08/12/18 0954  Height: _0  (1.702 m)   Body mass index is 80.11 kg/m.  Physical Exam Constitutional:      Comments: She is worried that her cellulitis will come back but otherwise in no distress.  Skin:    Comments: She has an Ace wrap on her  left lower leg that I did not remove today.  Psychiatric:        Mood and Affect: Mood normal.     Lab Results    Problem List Items Addressed This Visit      High   Serratia infection    I believe that her Serratia bacteremia was likely due to early pyelonephritis unrelated to her recurrent cellulitis.  I believe it has been cured.      Recurrent cellulitis of lower extremity    She has chronic lymphedema of her lower leg complicated by recurrent cellulitis.  I suggest continuing intermittent IM benzathine penicillin every 3 to 4 weeks for the next few months while she becomes established in the lymphedema clinic.  She does not have any evidence of active infection at this time.  I do not feel that she needs CT or MRI scanning to look for evidence of deep infection.  She will follow-up with my partner, [name] FNP, in 2 weeks.          Michel Bickers, MD [facility] for Infect[facility] [phone] pager   [phone] cell 08/12/2018, 11:26 AM

## 2018-08-12 NOTE — Assessment & Plan Note (Signed)
I believe that her Serratia bacteremia was likely due to early pyelonephritis unrelated to her recurrent cellulitis.  I believe it has been cured.

## 2018-08-26 ENCOUNTER — Ambulatory Visit: Payer: Medicare Other | Admitting: Family

## 2018-08-26 ENCOUNTER — Other Ambulatory Visit: Payer: Self-pay

## 2018-08-26 ENCOUNTER — Encounter: Payer: Self-pay | Admitting: Family

## 2018-08-26 VITALS — BP 160/89 | HR 90 | Temp 97.8°F

## 2018-08-26 DIAGNOSIS — L03119 Cellulitis of unspecified part of limb: Secondary | ICD-10-CM

## 2018-08-26 MED ORDER — PENICILLIN G BENZATHINE 1200000 UNIT/2ML IM SUSP
1.2000 10*6.[IU] | Freq: Once | INTRAMUSCULAR | Status: AC
  Administered 2018-08-26: 1.2 10*6.[IU] via INTRAMUSCULAR

## 2018-08-26 NOTE — Patient Instructions (Signed)
Nice to see you.  Your leg looks great!  We will continue with the Bicillin for now.  Plan to follow up in 1 month or sooner if needed.  Have a great day and stay safe!

## 2018-08-26 NOTE — Assessment & Plan Note (Signed)
[name] has recurrent cellulitis of the left lower extremity that appears to be responding well to IM benzathine penicillin every 3 to 4 weeks for prophylaxis.  She continues to attend the lymphedema clinic with significant improvements in her lower extremity edema.  Continues to walk with a Rollator.  Feeling better with no systemic symptoms.  Continue current dose of IM benzathine penicillin for today and possibly 1 additional dose.

## 2018-08-26 NOTE — Progress Notes (Signed)
Subjective:    Patient ID: Cheyenne Guzman, female    DOB: 20-Apr-1965, 53 y.o.   MRN: [id number]  Chief Complaint  Patient presents with  . Recurrent Skin Infections     HPI:  Cheyenne Guzman is a 53 y.o. female with multiple recurrences of cellulitis requiring hospitalization and recently seen in the office on 08/12/2018 with chronic lymphedema with recommendation for continued IM benzathine penicillin every 3 to 4 weeks while she establishes at the lymphedema clinic.  Cheyenne Guzman has been seen by the lymphedema clinic and having her leg wrapped initially twice per week and will be increasing to 3 times per week in the near future.  Edema has significantly decreased since initiating treatment with no evidence of cellulitis or infection at the present time.  She does continue to have a small area of pain and burning located on the anterior aspect of her left tibia.  No systemic symptoms of fevers, chills, or sweats.  She has felt better in the last 3 weeks than she has since this started.   Allergies  Allergen Reactions  . Vicodin [Hydrocodone-Acetaminophen] Nausea And Vomiting      Outpatient Medications Prior to Visit  Medication Sig Dispense Refill  . albuterol (PROVENTIL HFA;VENTOLIN HFA) 108 (90 Base) MCG/ACT inhaler Inhale 2 puffs into the lungs every 4 (four) hours as needed for wheezing or shortness of breath. 1 Inhaler 11  . albuterol (PROVENTIL) (2.5 MG/3ML) 0.083% nebulizer solution 1 VIAL IN NEBULIZER EVERY 4 HOURS AS NEEDED FOR WHEEZING (Patient taking differently: Take 2.5 mg by nebulization every 4 (four) hours as needed for wheezing. ) 75 mL 1  . ALPRAZolam (XANAX) 1 MG tablet TAKE 1 TABLET BY MOUTH THREE TIMES A DAY (Patient taking differently: Take 1 mg by mouth 3 (three) times daily as needed for anxiety. ) 90 tablet 5  . AZO-CRANBERRY PO Take 2 tablets by mouth daily.    . Blood Glucose Monitoring Suppl (CONTOUR NEXT MONITOR) w/Device KIT 1 each by Does not apply  route 2 (two) times daily. To check blood sugars twice a day. 1 kit 0  . Buprenorphine 15 MCG/HR PTWK Place 1 patch onto the skin every Saturday.   0  . fluconazole (DIFLUCAN) 150 MG tablet Take 1 tablet (150 mg total) by mouth every three (3) days as needed (for yeast infection). 5 tablet 1  . furosemide (LASIX) 20 MG tablet Take 1 tablet (20 mg total) by mouth daily. Take extra 1m as needed if swelling worse. (Patient taking differently: Take 20 mg by mouth daily. ) 30 tablet 4  . gabapentin (NEURONTIN) 300 MG capsule Take 300 mg by mouth 2 (two) times daily.     .Marland Kitchenglucose blood (CONTOUR NEXT TEST) test strip Use to test blood sugar 2 times daily 100 each 12  . glucose blood test strip Use as instructed 100 each 0  . Insulin Glargine (LANTUS SOLOSTAR) 100 UNIT/ML Solostar Pen Inject 280 Units into the skin every morning. and pen needles 3/day (Patient taking differently: Inject 285 Units into the skin at bedtime. ) 30 pen 11  . levothyroxine (SYNTHROID) 75 MCG tablet Take 1 tablet (75 mcg total) by mouth daily. 90 tablet 3  . methocarbamol (ROBAXIN) 750 MG tablet Take 1 tablet (750 mg total) by mouth 2 (two) times daily. (Patient taking differently: Take 750 mg by mouth 3 (three) times daily. ) 60 tablet 5  . Multiple Vitamin (MULTIVITAMIN WITH MINERALS) TABS tablet Take 1 tablet by  mouth daily.    . rivaroxaban (XARELTO) 20 MG TABS tablet Take 20 mg by mouth daily.    Marland Kitchen senna-docusate (SENOKOT-S) 8.6-50 MG tablet Take 1 tablet by mouth 2 (two) times daily.    . sertraline (ZOLOFT) 100 MG tablet TAKE 2 TABLETS (200 MG TOTAL) BY MOUTH AT BEDTIME. 180 tablet 3   No facility-administered medications prior to visit.      Past Medical History:  Diagnosis Date  . Anxiety    per pt. - mild   . Arthritis    back & ankles   . Asthma    seasonal   . Cancer [facility])    breast, sees Dr. Jana Hakim , Left - 11/2007  . Complication of anesthesia 2013   severe muscle spasms-sore-no doc  . Depression    . Diabetes mellitus    sees Dr. Chalmers Cater, diagnosed 2003  . HA (headache)   . Hearing loss    Went to [facility] Throat,[facility]  . Hyperlipidemia   . Kidney infection    - hosp. [facility]- septic- 10/2009  . Low back pain   . Peripheral neuropathy    R sided numbness- face & jaw  . Sleep apnea 9/15   severe-just started cpap-doing well  . TIA (transient ischemic attack)    in 2007,2008, and 2011  . Tinnitus of right ear   . Urosepsis 08-2009   with Klebsiella     Past Surgical History:  Procedure Laterality Date  . ABDOMINAL HYSTERECTOMY  08/2004  . BREAST BIOPSY  06/22/2011   Procedure: BREAST BIOPSY;  Surgeon: Stark Klein, MD;  Location: [place];  Service: General;  Laterality: Right;  . BREAST SURGERY  11/2007   left ductal carcinoma in situ  . CARPAL TUNNEL RELEASE Right 10/13/2013   Procedure: RIGHT CARPAL TUNNEL RELEASE;  Surgeon: Hessie Dibble, MD;  Location: [place];  Service: Orthopedics;  Laterality: Right;  . CARPAL TUNNEL RELEASE Left 11/24/2013   Procedure: LEFT CARPAL TUNNEL RELEASE WITH CYST EXCISION ;  Surgeon: Hessie Dibble, MD;  Location: [place];  Service: Orthopedics;  Laterality: Left;  . EAR CYST EXCISION Left 11/24/2013   Procedure: CYST REMOVAL;  Surgeon: Hessie Dibble, MD;  Location: [place];  Service: Orthopedics;  Laterality: Left;  . FOOT SURGERY  2000   plantar fasciitis-left  . LUMBAR DISC SURGERY  03-29-14   Fusion, revision 04-27-14 L4-5 Cauda Equina with graft  . NERVE GRAFT  06-01-08   cadaver graft to right inferior alveolar nerve  [place]       Review of Systems  Constitutional: Negative for appetite change, chills, diaphoresis, fatigue, fever and unexpected weight change.  Eyes:       Negative for acute change in vision  Respiratory: Negative for chest tightness, shortness of breath and wheezing.   Cardiovascular: Positive for leg swelling. Negative for  chest pain.  Gastrointestinal: Negative for abdominal pain, diarrhea, nausea and vomiting.  Genitourinary: Negative for dysuria, pelvic pain and vaginal discharge.  Skin: Negative for rash.  Neurological: Negative for headaches.  Hematological: Negative for adenopathy. Does not bruise/bleed easily.      Objective:    BP (!) 160/89   Pulse 90   Temp 97.8 F (36.6 C)  Nursing note and vital signs reviewed.  Physical Exam Constitutional:      General: She is not in acute distress.    Appearance: She is well-developed. She is obese.  She is not ill-appearing.  Cardiovascular:     Rate and Rhythm: Normal rate and regular rhythm.     Heart sounds: Normal heart sounds.  Pulmonary:     Effort: Pulmonary effort is normal.     Breath sounds: Normal breath sounds.  Skin:    General: Skin is warm and dry.     Comments: Left lower extremity with decreased edema compared to previous evaluations.  There is mild discoloration and skin peeling likely from the wrappings.  Neurological:     Mental Status: She is alert and oriented to person, place, and time.        Depression screen PHQ 2/9 08/12/2018  Decreased Interest 0  Down, Depressed, Hopeless 0  PHQ - 2 Score 0  Some recent data might be hidden       Assessment & Plan:   Problem List Items Addressed This Visit      Other   Recurrent cellulitis of lower extremity - Primary    Cheyenne Guzman has recurrent cellulitis of the left lower extremity that appears to be responding well to IM benzathine penicillin every 3 to 4 weeks for prophylaxis.  She continues to attend the lymphedema clinic with significant improvements in her lower extremity edema.  Continues to walk with a Rollator.  Feeling better with no systemic symptoms.  Continue current dose of IM benzathine penicillin for today and possibly 1 additional dose.          I am having Roda Shutters. Wrobleski maintain her albuterol, methocarbamol, gabapentin, glucose blood, Contour Next  Monitor, glucose blood, albuterol, Buprenorphine, Insulin Glargine, ALPRAZolam, rivaroxaban, AZO-CRANBERRY PO, furosemide, fluconazole, sertraline, multivitamin with minerals, senna-docusate, and levothyroxine. We administered penicillin g benzathine and penicillin g benzathine.   Meds ordered this encounter  Medications  . penicillin g benzathine (BICILLIN LA) 1200000 UNIT/2ML injection 1.2 Million Units  . penicillin g benzathine (BICILLIN LA) 1200000 UNIT/2ML injection 1.2 Million Units     Follow-up: Return in about 1 month (around 09/26/2018), or if symptoms worsen or fail to improve.   [name], MSN, FNP-C Nurse Practitioner [facility] [place] number: [phone]

## 2018-09-05 ENCOUNTER — Other Ambulatory Visit: Payer: Self-pay | Admitting: Internal Medicine

## 2018-09-10 ENCOUNTER — Ambulatory Visit: Payer: Medicare Other | Admitting: Internal Medicine

## 2018-09-16 ENCOUNTER — Other Ambulatory Visit: Payer: Self-pay

## 2018-09-17 ENCOUNTER — Encounter: Payer: Self-pay | Admitting: Internal Medicine

## 2018-09-17 ENCOUNTER — Ambulatory Visit (INDEPENDENT_AMBULATORY_CARE_PROVIDER_SITE_OTHER): Payer: Medicare Other | Admitting: Internal Medicine

## 2018-09-17 VITALS — BP 132/78 | HR 86 | Temp 98.3°F | Ht 67.0 in | Wt 399.2 lb

## 2018-09-17 DIAGNOSIS — Z794 Long term (current) use of insulin: Secondary | ICD-10-CM

## 2018-09-17 DIAGNOSIS — E1142 Type 2 diabetes mellitus with diabetic polyneuropathy: Secondary | ICD-10-CM

## 2018-09-17 DIAGNOSIS — E1165 Type 2 diabetes mellitus with hyperglycemia: Secondary | ICD-10-CM

## 2018-09-17 LAB — POCT GLYCOSYLATED HEMOGLOBIN (HGB A1C): Hemoglobin A1C: 9.6 % — AB (ref 4.0–5.6)

## 2018-09-17 LAB — GLUCOSE, POCT (MANUAL RESULT ENTRY): POC Glucose: 344 mg/dl — AB (ref 70–99)

## 2018-09-17 MED ORDER — METFORMIN HCL 1000 MG PO TABS: 1000.0000 mg | ORAL_TABLET | Freq: Every day | ORAL | 3 refills | Status: DC

## 2018-09-17 MED ORDER — "INSULIN SYRINGE 31G X 5/16"" 1 ML MISC": 11 refills | Status: DC

## 2018-09-17 MED ORDER — INSULIN ASPART PROT & ASPART (70-30 MIX) 100 UNIT/ML ~~LOC~~ SUSP: 60.0000 [IU] | Freq: Two times a day (BID) | SUBCUTANEOUS | 3 refills | Status: DC

## 2018-09-17 NOTE — Progress Notes (Signed)
Name: Cheyenne Guzman  Age/ Sex: 53 y.o., female   MRN/ DOB: [id number], [birth year]     PCP: Laurey Morale, MD   Reason for Endocrinology Evaluation: Type 2 Diabetes Mellitus  Initial Endocrine Consultative Visit: 08/06/2016    PATIENT IDENTIFIER: Cheyenne Guzman is a 53 y.o. female with a past medical history of T2DM, HTN, OSA. The patient has followed with Endocrinology clinic since 08/06/2016 for consultative assistance with management of her diabetes.  DIABETIC HISTORY:  Cheyenne Guzman was diagnosed with T2DM in 2000. Has been on Metformin without side effects  .Has been on insulin since 2009. Her hemoglobin A1c has ranged from 8.2% in 2017, peaking at 10.3% in 2019.   SUBJECTIVE:   Today (09/18/2018): Cheyenne Guzman is here to establish care for diabetes management . She is accompanied by her daughter [name]. She checks her blood sugars 1-2 times daily, preprandial. The patient has not had hypoglycemic episodes since the last clinic. Otherwise, the patient has not required any recent emergency interventions for hypoglycemia and has had recent hospitalizations secondary to LE cellulitis.   Pt stopped using insulin ~ 2 weeks ago, it was getting too expensive for her.    Eats 3 meals a day, snacks rarely, avoids sugar- sweetened beverages. She has been doing weight watechers and has lost 30 Lbs, she has a pool at home and uses it daily.     ROS: As per HPI and as detailed below: Review of Systems  Constitutional: Positive for weight loss. Negative for fever.  Cardiovascular: Positive for leg swelling. Negative for chest pain and palpitations.  Gastrointestinal: Negative for diarrhea and nausea.  Neurological: Positive for tingling. Negative for tremors.       HOME DIABETES REGIMEN:  lantus 285 units daily - has been out for a week and half     Statin: intolerant  ACE-I/ARB: no Prior Diabetic Education: Yes   METER DOWNLOAD SUMMARY: Did not bring        DIABETIC  COMPLICATIONS: Microvascular complications:   Neuropathy, ? Mild retinopathy   Denies: CKD  Last Eye Exam: Completed 2019  Macrovascular complications:   TIA  Denies: CAD, CVA, PVD   HISTORY:  Past Medical History:  Past Medical History:  Diagnosis Date  . Anxiety    per pt. - mild   . Arthritis    back & ankles   . Asthma    seasonal   . Cancer [facility])    breast, sees Dr. Jana Hakim , Left - 11/2007  . Complication of anesthesia 2013   severe muscle spasms-sore-no doc  . Depression   . Diabetes mellitus    sees Dr. Chalmers Cater, diagnosed 2003  . HA (headache)   . Hearing loss    Went to [facility] Throat,[facility]  . Hyperlipidemia   . Kidney infection    - hosp. [facility]- septic- 10/2009  . Low back pain   . Peripheral neuropathy    R sided numbness- face & jaw  . Sleep apnea 9/15   severe-just started cpap-doing well  . TIA (transient ischemic attack)    in 2007,2008, and 2011  . Tinnitus of right ear   . Urosepsis 08-2009   with Klebsiella   Past Surgical History:  Past Surgical History:  Procedure Laterality Date  . ABDOMINAL HYSTERECTOMY  08/2004  . BREAST BIOPSY  06/22/2011   Procedure: BREAST BIOPSY;  Surgeon: Stark Klein, MD;  Location: [place];  Service: General;  Laterality: Right;  .  BREAST SURGERY  11/2007   left ductal carcinoma in situ  . CARPAL TUNNEL RELEASE Right 10/13/2013   Procedure: RIGHT CARPAL TUNNEL RELEASE;  Surgeon: Hessie Dibble, MD;  Location: [place];  Service: Orthopedics;  Laterality: Right;  . CARPAL TUNNEL RELEASE Left 11/24/2013   Procedure: LEFT CARPAL TUNNEL RELEASE WITH CYST EXCISION ;  Surgeon: Hessie Dibble, MD;  Location: [place];  Service: Orthopedics;  Laterality: Left;  . EAR CYST EXCISION Left 11/24/2013   Procedure: CYST REMOVAL;  Surgeon: Hessie Dibble, MD;  Location: [place];  Service: Orthopedics;  Laterality: Left;  . FOOT SURGERY  2000    plantar fasciitis-left  . LUMBAR DISC SURGERY  03-29-14   Fusion, revision 04-27-14 L4-5 Cauda Equina with graft  . NERVE GRAFT  06-01-08   cadaver graft to right inferior alveolar nerve  in [place]    Social History:  reports that she quit smoking about 21 years ago. She has never used smokeless tobacco. She reports current alcohol use. She reports that she does not use drugs. Family History:  Family History  Problem Relation Age of Onset  . Cancer Mother        breast  . Cancer Father        lung  . Diabetes Father   . Cancer Sister        colon  . Cancer Maternal Aunt        breast - both  . Diabetes Sister   . Anesthesia problems Neg Hx      HOME MEDICATIONS: Allergies as of 09/17/2018      Reactions   Vicodin [hydrocodone-acetaminophen] Nausea And Vomiting      Medication List       Accurate as of September 17, 2018 11:59 PM. If you have any questions, ask your nurse or doctor.        STOP taking these medications   Insulin Glargine 100 UNIT/ML Solostar Pen Commonly known as: Lantus SoloStar Stopped by: Dorita Sciara, MD     TAKE these medications   albuterol (2.5 MG/3ML) 0.083% nebulizer solution Commonly known as: PROVENTIL 1 VIAL IN NEBULIZER EVERY 4 HOURS AS NEEDED FOR WHEEZING What changed: See the new instructions.   albuterol 108 (90 Base) MCG/ACT inhaler Commonly known as: VENTOLIN HFA Inhale 2 puffs into the lungs every 4 (four) hours as needed for wheezing or shortness of breath. What changed: Another medication with the same name was changed. Make sure you understand how and when to take each.   ALPRAZolam 1 MG tablet Commonly known as: XANAX TAKE 1 TABLET BY MOUTH THREE TIMES A DAY What changed:   when to take this  reasons to take this   AZO-CRANBERRY PO Take 2 tablets by mouth daily.   Buprenorphine 15 MCG/HR Ptwk Place 1 patch onto the skin every Saturday.   Contour Next Monitor w/Device Kit 1 each by Does not apply route 2  (two) times daily. To check blood sugars twice a day.   fluconazole 150 MG tablet Commonly known as: DIFLUCAN Take 1 tablet (150 mg total) by mouth every three (3) days as needed (for yeast infection).   furosemide 20 MG tablet Commonly known as: LASIX Take 1 tablet (20 mg total) by mouth daily. Take extra 42m as needed if swelling worse. What changed: additional instructions   gabapentin 300 MG capsule Commonly known as: NEURONTIN Take 300 mg by mouth 2 (two) times daily.  glucose blood test strip Use as instructed   glucose blood test strip Commonly known as: Contour Next Test Use to test blood sugar 2 times daily   insulin aspart protamine- aspart (70-30) 100 UNIT/ML injection Commonly known as: NovoLOG Mix 70/30 Inject 0.6 mLs (60 Units total) into the skin 2 (two) times daily with a meal. Started by: Dorita Sciara, MD   INSULIN SYRINGE 1CC/31GX5/16" 31G X 5/16" 1 ML Misc 2x daily Started by: Dorita Sciara, MD   levothyroxine 75 MCG tablet Commonly known as: SYNTHROID Take 1 tablet (75 mcg total) by mouth daily.   metFORMIN 1000 MG tablet Commonly known as: GLUCOPHAGE Take 1 tablet (1,000 mg total) by mouth daily with breakfast. Started by: Dorita Sciara, MD   methocarbamol 750 MG tablet Commonly known as: ROBAXIN Take 1 tablet (750 mg total) by mouth 2 (two) times daily. What changed: when to take this   multivitamin with minerals Tabs tablet Take 1 tablet by mouth daily.   rivaroxaban 20 MG Tabs tablet Commonly known as: XARELTO Take 20 mg by mouth daily.   senna-docusate 8.6-50 MG tablet Commonly known as: Senokot-S Take 1 tablet by mouth 2 (two) times daily.   sertraline 100 MG tablet Commonly known as: ZOLOFT TAKE 2 TABLETS (200 MG TOTAL) BY MOUTH AT BEDTIME.        OBJECTIVE:   Vital Signs: BP 132/78 (BP Location: Left Arm, Patient Position: Sitting, Cuff Size: Large)   Pulse 86   Temp 98.3 F (36.8 C)   Ht '5\' 7"'   (1.702 m)   Wt (!) 399 lb 3.2 oz (181.1 kg)   SpO2 96%   BMI 62.52 kg/m   Wt Readings from Last 3 Encounters:  09/17/18 (!) 399 lb 3.2 oz (181.1 kg)  07/31/18 (!) 511 lb 7.5 oz (232 kg)  07/09/18 (!) 400 lb (181.4 kg)     Exam: General: Pt appears well and is in NAD  HEENT: Head: Unremarkable with good dentition. Oropharynx clear without exudate.  Eyes: External eye exam normal without stare, lid lag or exophthalmos.  EOM intact.   Neck: General: Supple without adenopathy. Thyroid: Thyroid size normal.  No goiter or nodules appreciated. No thyroid bruit.  Lungs: Clear with good BS bilat with no rales, rhonchi, or wheezes  Heart: RRR with normal S1 and S2 and no gallops; no murmurs; no rub  Abdomen: Normoactive bowel sounds, soft, nontender, without masses or organomegaly palpable  Extremities: Right LE trace edema, left LE bandaged  Skin: Normal texture and temperature to palpation. lipohypertrophy noted on abdominal wall .  Neuro: MS is good with appropriate affect, pt is alert and Ox3     DATA REVIEWED:  Lab Results  Component Value Date   HGBA1C 9.6 (A) 09/17/2018   HGBA1C 8.4 (H) 07/31/2018   HGBA1C 9.0 (H) 06/19/2018   Lab Results  Component Value Date   MICROALBUR 1.5 04/14/2009   LDLCALC 78 11/28/2012   CREATININE 1.02 (H) 08/02/2018   Lab Results  Component Value Date   MICRALBCREAT 15.2 04/14/2009     Lab Results  Component Value Date   CHOL 148 11/28/2012   HDL 31 (L) 11/28/2012   LDLCALC 78 11/28/2012   LDLDIRECT 189.7 11/27/2006   TRIG 193 (H) 11/28/2012   CHOLHDL 4.8 11/28/2012         ASSESSMENT / PLAN / RECOMMENDATIONS:   1) Type 2 Diabetes Mellitus, Sub-Optimally controlled, With retinopathic and neuropathic complications - Most recent A1c of 9.6 %.  Goal A1c < 7.0 %.   - I have discussed with the patient the pathophysiology of diabetes. We went over the natural progression of the disease. We talked about both insulin resistance and insulin  deficiency. We stressed the importance of lifestyle changes including diet and exercise. I explained the complications associated with diabetes including retinopathy, nephropathy, neuropathy as well as increased risk of cardiovascular disease. We went over the benefit seen with glycemic control.  I explained to the patient that diabetic patients are at higher than normal risk for amputations  - Pt stopped using lantus of 285 units daily as it was too expensive for her and didn't see any improvement in her glucose reading despite increasing the dose.  - We did discussed the difference between basal and prandial insulin, she may benefit from starting a novolog mix, as historically ( per records) she did not do well with multiple daily injections of insulin.  - We did discuss add -on therapy to improve insulin resistance with Metformin, SGLT-2 inhibitors and GLP-1 agonists, we did discuss the benefit and risk of classes, we are going to start with Metformin at this time and proceed from there.  - If she is going to need 200 units of insulin , she understand will have to switch her to the U-500 insulin.  - She was also advised to avoid the lipohypertrophic areas on her abdomen  - She was encouraged to notify us should be BG's remain > 200 mg/dL    MEDICATIONS: - STOP Lantus  - Start Metformin Half a tablet with Breakfast for one week, if no vomiting or diarrhea, increase to 1 full tablet with Breakfast  - Start Novolog Mix 60 units With Breakfast and 60 units with Supper   EDUCATION / INSTRUCTIONS:  BG monitoring instructions: Patient is instructed to check her blood sugars 2 times a day, before breakfast and supper.  Call [place] [facility] if: BG persistently < 70 or > 300. . I reviewed the Rule of 15 for the treatment of hypoglycemia in detail with the patient. Literature supplied.    2) Diabetic complications:   Eye: Does have known diabetic retinopathy. Per pt (mild)  Neuro/  Feet: Does have known diabetic peripheral neuropathy .  Renal: Patient does not have known baseline CKD. She   is not on an ACEI/ARB at present.   3) Lipids: Patient is not on a statin. We discussed the cardioprotective benefits of statins, will revisit during future appointments.      25 minutes was spent with the pt , > 50% of the time was spent in counseling and education   F/U in 3 months    Signed electronically by: Mack Guise, MD  [facility] Endocrinology  [address] [place], [place] [address] Phone: [phone] FAX: [phone]   CC: Laurey Morale, [place] [address] Phone: [phone]  Fax: [phone]  Return to Endocrinology clinic as below: Future Appointments  Date Time Provider [place]  09/25/2018 11:00 AM Golden Circle, FNP [facility]  11/05/2018  3:20 PM Zohan Shiflet, Melanie Crazier, MD [facility] None

## 2018-09-17 NOTE — Patient Instructions (Signed)
-   STOP Lantus  - Start Metformin Half a tablet with Breakfast for one week, if no vomiting or diarrhea, increase to 1 full tablet with Breakfast  - Start Novolog Mix 60 units With Breakfast and 60 units with Supper      HOW TO TREAT LOW BLOOD SUGARS (Blood sugar LESS THAN 70 MG/DL)  Please follow the RULE OF 15 for the treatment of hypoglycemia treatment (when your (blood sugars are less than 70 mg/dL)    STEP 1: Take 15 grams of carbohydrates when your blood sugar is low, which includes:   3-4 GLUCOSE TABS  OR  3-4 OZ OF JUICE OR REGULAR SODA OR  ONE TUBE OF GLUCOSE GEL     STEP 2: RECHECK blood sugar in 15 MINUTES STEP 3: If your blood sugar is still low at the 15 minute recheck --> then, go back to STEP 1 and treat AGAIN with another 15 grams of carbohydrates.

## 2018-09-18 ENCOUNTER — Other Ambulatory Visit: Payer: Self-pay

## 2018-09-18 DIAGNOSIS — Z794 Long term (current) use of insulin: Secondary | ICD-10-CM | POA: Insufficient documentation

## 2018-09-18 DIAGNOSIS — E1142 Type 2 diabetes mellitus with diabetic polyneuropathy: Secondary | ICD-10-CM | POA: Insufficient documentation

## 2018-09-18 DIAGNOSIS — E1165 Type 2 diabetes mellitus with hyperglycemia: Secondary | ICD-10-CM | POA: Insufficient documentation

## 2018-09-18 MED ORDER — INSULIN LISPRO PROT & LISPRO (75-25 MIX) 100 UNIT/ML ~~LOC~~ SUSP: 60.0000 [IU] | Freq: Two times a day (BID) | SUBCUTANEOUS | 11 refills | Status: DC

## 2018-09-25 ENCOUNTER — Other Ambulatory Visit: Payer: Self-pay

## 2018-09-25 ENCOUNTER — Encounter: Payer: Self-pay | Admitting: Internal Medicine

## 2018-09-25 ENCOUNTER — Ambulatory Visit: Payer: Medicare Other | Admitting: Family

## 2018-09-25 MED ORDER — INSULIN LISPRO PROT & LISPRO (75-25 MIX) 100 UNIT/ML ~~LOC~~ SUSP: 60.0000 [IU] | Freq: Two times a day (BID) | SUBCUTANEOUS | 3 refills | Status: DC

## 2018-10-11 ENCOUNTER — Encounter: Payer: Self-pay | Admitting: Family Medicine

## 2018-10-13 MED ORDER — RIVAROXABAN 20 MG PO TABS: 20.0000 mg | ORAL_TABLET | Freq: Every day | ORAL | 11 refills | Status: DC

## 2018-10-13 NOTE — Telephone Encounter (Signed)
I sent this in

## 2018-10-17 ENCOUNTER — Encounter: Payer: Self-pay | Admitting: Internal Medicine

## 2018-10-21 ENCOUNTER — Encounter: Payer: Self-pay | Admitting: Family Medicine

## 2018-10-21 NOTE — Telephone Encounter (Signed)
Yes she can take the Amoxicillin and see if it helps. Drink lots of water. She should have an OV if she is not feeling better by Friday

## 2018-10-21 NOTE — Telephone Encounter (Signed)
Please advise 

## 2018-10-31 ENCOUNTER — Encounter: Payer: Self-pay | Admitting: Family Medicine

## 2018-11-03 ENCOUNTER — Other Ambulatory Visit: Payer: Self-pay

## 2018-11-03 NOTE — Telephone Encounter (Signed)
Set up an in person OV so I can examine this

## 2018-11-04 ENCOUNTER — Encounter: Payer: Self-pay | Admitting: Family Medicine

## 2018-11-04 ENCOUNTER — Ambulatory Visit (INDEPENDENT_AMBULATORY_CARE_PROVIDER_SITE_OTHER): Payer: Medicare Other | Admitting: Family Medicine

## 2018-11-04 VITALS — BP 122/82 | HR 96 | Temp 98.2°F | Ht 67.0 in | Wt 395.6 lb

## 2018-11-04 DIAGNOSIS — K439 Ventral hernia without obstruction or gangrene: Secondary | ICD-10-CM

## 2018-11-04 DIAGNOSIS — Z23 Encounter for immunization: Secondary | ICD-10-CM | POA: Diagnosis not present

## 2018-11-04 NOTE — Progress Notes (Signed)
   Subjective:    Patient ID: Cheyenne Guzman, female    DOB: [birth year], 53 y.o.   MRN: [id number]  HPI Here to check a possible hernia in the abdomen. She has been working hard to lose weight for the past 9 months, and she has lost 50 lbs! As she lost weight she began to notice a bulge in the center of the abdomen when she sat up from a prone position. This never bothered her until 2 weeks ago, when she began to have pain in this area. Now she has daily sharp pains in the center of the abdomen that make her nauseated (without vomiting). She has freuquent GERD but no trouble swallowing. Her BMs are normal. She passes a lot more flatus than she used to but she attributes this to eating more fruits and vegetables than usual.    Review of Systems  Constitutional: Negative.   Respiratory: Negative.   Cardiovascular: Negative.   Gastrointestinal: Positive for abdominal pain and nausea. Negative for abdominal distention, anal bleeding, blood in stool, constipation, diarrhea, rectal pain and vomiting.       Objective:   Physical Exam Constitutional:      Appearance: Normal appearance.  Cardiovascular:     Rate and Rhythm: Normal rate and regular rhythm.     Pulses: Normal pulses.     Heart sounds: Normal heart sounds.  Pulmonary:     Effort: Pulmonary effort is normal.     Breath sounds: Normal breath sounds.  Abdominal:     General: Abdomen is flat. Bowel sounds are normal. There is no distension.     Tenderness: There is no guarding or rebound.     Comments: She does have a diastasis recti, but in the middle of this is a firm well defined mass that is quite tender. The remainder of the abdomen is unremarkable.   Neurological:     Mental Status: She is alert.           Assessment & Plan:  Ventral hernia, we will refer her to Surgery to evaluate.  [name], MD

## 2018-11-04 NOTE — Telephone Encounter (Signed)
Pt has been scheduled for today

## 2018-11-05 ENCOUNTER — Encounter: Payer: Self-pay | Admitting: Internal Medicine

## 2018-11-05 ENCOUNTER — Ambulatory Visit (INDEPENDENT_AMBULATORY_CARE_PROVIDER_SITE_OTHER): Payer: Medicare Other | Admitting: Internal Medicine

## 2018-11-05 ENCOUNTER — Other Ambulatory Visit: Payer: Self-pay

## 2018-11-05 VITALS — BP 122/68 | HR 84 | Temp 99.6°F | Ht 67.0 in | Wt 397.6 lb

## 2018-11-05 DIAGNOSIS — E785 Hyperlipidemia, unspecified: Secondary | ICD-10-CM

## 2018-11-05 DIAGNOSIS — E1165 Type 2 diabetes mellitus with hyperglycemia: Secondary | ICD-10-CM | POA: Diagnosis not present

## 2018-11-05 DIAGNOSIS — Z794 Long term (current) use of insulin: Secondary | ICD-10-CM

## 2018-11-05 LAB — POCT GLYCOSYLATED HEMOGLOBIN (HGB A1C): Hemoglobin A1C: 8.7 % — AB (ref 4.0–5.6)

## 2018-11-05 MED ORDER — INSULIN LISPRO PROT & LISPRO (75-25 MIX) 100 UNIT/ML KWIKPEN: 72.0000 [IU] | PEN_INJECTOR | Freq: Two times a day (BID) | SUBCUTANEOUS | 11 refills | Status: DC

## 2018-11-05 NOTE — Patient Instructions (Signed)
-   Continue Metformin 1000 mg, half a tablet  - Humalog Mix  80 units With Breakfast and 80 units with Supper      HOW TO TREAT LOW BLOOD SUGARS (Blood sugar LESS THAN 70 MG/DL)  Please follow the RULE OF 15 for the treatment of hypoglycemia treatment (when your (blood sugars are less than 70 mg/dL)    STEP 1: Take 15 grams of carbohydrates when your blood sugar is low, which includes:   3-4 GLUCOSE TABS  OR  3-4 OZ OF JUICE OR REGULAR SODA OR  ONE TUBE OF GLUCOSE GEL     STEP 2: RECHECK blood sugar in 15 MINUTES STEP 3: If your blood sugar is still low at the 15 minute recheck --> then, go back to STEP 1 and treat AGAIN with another 15 grams of carbohydrates.

## 2018-11-05 NOTE — Progress Notes (Signed)
Name: Cheyenne Guzman  Age/ Sex: 53 y.o., female   MRN/ DOB: [id number], 07-Aug-1965     PCP: Laurey Morale, MD   Reason for Endocrinology Evaluation: Type 2 Diabetes Mellitus  Initial Endocrine Consultative Visit: 08/06/2016    PATIENT IDENTIFIER: [name] is a 53 y.o. female with a past medical history of T2DM, HTN, OSA. The patient has followed with Endocrinology clinic since 08/06/2016 for consultative assistance with management of her diabetes.  DIABETIC HISTORY:  [name] was diagnosed with T2DM in 2000. Has been on Metformin without side effects  .Has been on insulin since 2009. Her hemoglobin A1c has ranged from 8.2% in 2017, peaking at 10.3% in 2019.  Re-established with me 08/2018. Stopped lantus and started Novolog mix as well as metformin.   SUBJECTIVE:   Today (11/07/2018): [name] is here for a 2 month follow up on diabetes management . She is accompanied by her daughter [name]. She checks her blood sugars 1-2 times daily, preprandial. The patient has not had hypoglycemic episodes since the last clinic. Otherwise, the patient has not required any recent emergency interventions for hypoglycemia and has had recent hospitalizations secondary to LE cellulitis.     Eats 3 meals a day, snacks rarely, avoids sugar- sweetened beverages. She has been doing weight watechers and has lost 30 Lbs, she has a pool at home and uses it daily.     ROS: As per HPI and as detailed below: Review of Systems  Constitutional: Positive for weight loss. Negative for fever.  Cardiovascular: Positive for leg swelling. Negative for chest pain and palpitations.  Gastrointestinal: Negative for diarrhea and nausea.  Neurological: Positive for tingling. Negative for tremors.       HOME DIABETES REGIMEN:  Huamlog Mix 60 units BID - taking 72 units  Metformin 1000 mg, half tablet daily     Statin: intolerant  ACE-I/ARB: no Prior Diabetic Education: Yes   METER DOWNLOAD  SUMMARY: 9/15-10/14/20 Fingerstick Blood Glucose Tests = 24 Average Number Tests/Day = 0.8 Overall Mean FS Glucose =175 Standard Deviation = 77  BG Ranges: Low = 129 High = 340   Hypoglycemic Events/30 Days: BG < 50 = 0 Episodes of symptomatic severe hypoglycemia = 0        DIABETIC COMPLICATIONS: Microvascular complications:   Neuropathy, ? Mild retinopathy   Denies: CKD  Last Eye Exam: Completed 2019  Macrovascular complications:   TIA  Denies: CAD, CVA, PVD   HISTORY:  Past Medical History:  Past Medical History:  Diagnosis Date  . Anxiety    per pt. - mild   . Arthritis    back & ankles   . Asthma    seasonal   . Cancer [facility])    breast, sees Dr. Jana Hakim , Left - 11/2007  . Complication of anesthesia 2013   severe muscle spasms-sore-no doc  . Depression   . Diabetes mellitus    sees Dr. Chalmers Cater, diagnosed 2003  . HA (headache)   . Hearing loss    Went to [facility] Throat,[facility]  . Hyperlipidemia   . Kidney infection    - hosp. [facility]- septic- 10/2009  . Low back pain   . Peripheral neuropathy    R sided numbness- face & jaw  . Sleep apnea 9/15   severe-just started cpap-doing well  . TIA (transient ischemic attack)    in 2007,2008, and 2011  . Tinnitus of right ear   . Urosepsis 08-2009  with Klebsiella   Past Surgical History:  Past Surgical History:  Procedure Laterality Date  . ABDOMINAL HYSTERECTOMY  08/2004  . BREAST BIOPSY  06/22/2011   Procedure: BREAST BIOPSY;  Surgeon: Stark Klein, MD;  Location: [place];  Service: General;  Laterality: Right;  . BREAST SURGERY  11/2007   left ductal carcinoma in situ  . CARPAL TUNNEL RELEASE Right 10/13/2013   Procedure: RIGHT CARPAL TUNNEL RELEASE;  Surgeon: Hessie Dibble, MD;  Location: [place];  Service: Orthopedics;  Laterality: Right;  . CARPAL TUNNEL RELEASE Left 11/24/2013   Procedure: LEFT CARPAL TUNNEL RELEASE WITH CYST EXCISION ;  Surgeon:  Hessie Dibble, MD;  Location: [place];  Service: Orthopedics;  Laterality: Left;  . EAR CYST EXCISION Left 11/24/2013   Procedure: CYST REMOVAL;  Surgeon: Hessie Dibble, MD;  Location: [place];  Service: Orthopedics;  Laterality: Left;  . FOOT SURGERY  2000   plantar fasciitis-left  . LUMBAR DISC SURGERY  03-29-14   Fusion, revision 04-27-14 L4-5 Cauda Equina with graft  . NERVE GRAFT  06-01-08   cadaver graft to right inferior alveolar nerve  [place]    Social History:  reports that she quit smoking about 26 years ago. She has never used smokeless tobacco. She reports current alcohol use. She reports that she does not use drugs. Family History:  Family History  Problem Relation Age of Onset  . Cancer Mother        breast  . Cancer Father        lung  . Diabetes Father   . Cancer Sister        colon  . Cancer Maternal Aunt        breast - both  . Diabetes Sister   . Anesthesia problems Neg Hx      HOME MEDICATIONS: Allergies as of 11/05/2018      Reactions   Vicodin [hydrocodone-acetaminophen] Nausea And Vomiting      Medication List       Accurate as of November 05, 2018 11:59 PM. If you have any questions, ask your nurse or doctor.        STOP taking these medications   insulin lispro protamine-lispro (75-25) 100 UNIT/ML Susp injection Commonly known as: HUMALOG 75/25 MIX Replaced by: Insulin Lispro Prot & Lispro (75-25) 100 UNIT/ML Kwikpen Stopped by: Dorita Sciara, MD     TAKE these medications   albuterol (2.5 MG/3ML) 0.083% nebulizer solution Commonly known as: PROVENTIL 1 VIAL IN NEBULIZER EVERY 4 HOURS AS NEEDED FOR WHEEZING What changed: See the new instructions.   albuterol 108 (90 Base) MCG/ACT inhaler Commonly known as: VENTOLIN HFA Inhale 2 puffs into the lungs every 4 (four) hours as needed for wheezing or shortness of breath. What changed: Another medication with the same name was changed. Make  sure you understand how and when to take each.   ALPRAZolam 1 MG tablet Commonly known as: XANAX TAKE 1 TABLET BY MOUTH THREE TIMES A DAY What changed:   when to take this  reasons to take this   atorvastatin 10 MG tablet Commonly known as: LIPITOR Take 1 tablet (10 mg total) by mouth at bedtime. Started by: Dorita Sciara, MD   AZO-CRANBERRY PO Take 2 tablets by mouth daily.   Buprenorphine 15 MCG/HR Ptwk Place 1 patch onto the skin every Saturday.   Contour Next Monitor w/Device Kit 1 each by Does not apply route  2 (two) times daily. To check blood sugars twice a day.   fluconazole 150 MG tablet Commonly known as: DIFLUCAN Take 1 tablet (150 mg total) by mouth every three (3) days as needed (for yeast infection).   furosemide 20 MG tablet Commonly known as: LASIX Take 1 tablet (20 mg total) by mouth daily. Take extra 71m as needed if swelling worse. What changed: additional instructions   gabapentin 300 MG capsule Commonly known as: NEURONTIN Take 300 mg by mouth 2 (two) times daily.   glucose blood test strip Use as instructed   glucose blood test strip Commonly known as: Contour Next Test Use to test blood sugar 2 times daily   Insulin Lispro Prot & Lispro (75-25) 100 UNIT/ML Kwikpen Commonly known as: HumaLOG Mix 75/25 KwikPen Inject 72 Units into the skin 2 (two) times daily. Replaces: insulin lispro protamine-lispro (75-25) 100 UNIT/ML Susp injection Started by: IDorita Sciara MD   INSULIN SYRINGE 1CC/31GX5/16" 31G X 5/16" 1 ML Misc 2x daily   levothyroxine 75 MCG tablet Commonly known as: SYNTHROID Take 1 tablet (75 mcg total) by mouth daily.   metFORMIN 1000 MG tablet Commonly known as: GLUCOPHAGE Take 1 tablet (1,000 mg total) by mouth daily with breakfast. What changed: additional instructions   methocarbamol 750 MG tablet Commonly known as: ROBAXIN Take 1 tablet (750 mg total) by mouth 2 (two) times daily. What changed: when  to take this   multivitamin Chew chewable tablet Chew by mouth.   multivitamin with minerals Tabs tablet Take 1 tablet by mouth daily.   rivaroxaban 20 MG Tabs tablet Commonly known as: XARELTO Take 1 tablet (20 mg total) by mouth daily.   senna-docusate 8.6-50 MG tablet Commonly known as: Senokot-S Take 1 tablet by mouth 2 (two) times daily.   sertraline 100 MG tablet Commonly known as: ZOLOFT TAKE 2 TABLETS (200 MG TOTAL) BY MOUTH AT BEDTIME.        OBJECTIVE:   Vital Signs: BP 122/68 (BP Location: Left Arm, Patient Position: Sitting, Cuff Size: Large)   Pulse 84   Temp 99.6 F (37.6 C)   Ht '5\' 7"'  (1.702 m)   Wt (!) 397 lb 9.6 oz (180.4 kg)   SpO2 97%   BMI 62.27 kg/m   Wt Readings from Last 3 Encounters:  11/05/18 (!) 397 lb 9.6 oz (180.4 kg)  11/04/18 (!) 395 lb 9.6 oz (179.4 kg)  09/17/18 (!) 399 lb 3.2 oz (181.1 kg)     Exam: General: Pt appears well and is in NAD  HEENT: Head: Unremarkable with good dentition. Oropharynx clear without exudate.  Eyes: External eye exam normal without stare, lid lag or exophthalmos.  EOM intact.   Neck: General: Supple without adenopathy. Thyroid: Thyroid size normal.  No goiter or nodules appreciated. No thyroid bruit.  Lungs: Clear with good BS bilat with no rales, rhonchi, or wheezes  Heart: RRR with normal S1 and S2 and no gallops; no murmurs; no rub  Abdomen: Normoactive bowel sounds, soft, nontender, without masses or organomegaly palpable  Extremities: Right LE trace edema, left LE bandaged  Skin: Normal texture and temperature to palpation. lipohypertrophy noted on abdominal wall .  Neuro: MS is good with appropriate affect, pt is alert and Ox3     DATA REVIEWED:  Lab Results  Component Value Date   HGBA1C 8.7 (A) 11/05/2018   HGBA1C 9.6 (A) 09/17/2018   HGBA1C 8.4 (H) 07/31/2018   Lab Results  Component Value Date   MICROALBUR  15.7 (H) 11/05/2018   LDLCALC 78 11/28/2012   CREATININE 1.02 11/05/2018    Lab Results  Component Value Date   MICRALBCREAT 14.3 11/05/2018     Lab Results  Component Value Date   CHOL 219 (H) 11/05/2018   HDL 40.10 11/05/2018   LDLCALC 78 11/28/2012   LDLDIRECT 151.0 11/05/2018   TRIG 250.0 (H) 11/05/2018   CHOLHDL 5 11/05/2018         ASSESSMENT / PLAN / RECOMMENDATIONS:   1) Type 2 Diabetes Mellitus, Sub-Optimally controlled, With retinopathic and neuropathic complications - Most recent A1c of 8.6 %. Goal A1c < 7.0 %.   - Praised the patient on improved glycemic control, will increase her insulin a little more to get her BG's to get below 180 mg/dL  - Unable to start her on SGLT-2 inhibitors at this time as she is currently being treated for a UTI  - Will postpone GLP-1 agonist initiation until the next visit she she just got over nausea with higher dose of metformin. Unable to tolerate higher doses of metfomrin   MEDICATIONS:  - Continue Metformin 500 mg daily eakfast  - Increase Humalog  Mix 80 units BID   EDUCATION / INSTRUCTIONS:  BG monitoring instructions: Patient is instructed to check her blood sugars 2 times a day, before breakfast and supper.  Call [facility] if: BG persistently < 70 or > 300. . I reviewed the Rule of 15 for the treatment of hypoglycemia in detail with the patient. Literature supplied.    2) Diabetic complications:   Eye: Does have known diabetic retinopathy. Per pt (mild)  Neuro/ Feet: Does have known diabetic peripheral neuropathy .   Renal: Patient does not have known baseline CKD. She   is not on an ACEI/ARB at present.   3) Lipids: Patient is not on a statin. We discussed the cardioprotective benefits of statins, pt endorses myalgia with simvastatin, will start lipitor 10 mg daily , pt advised to also start taking OTC Vitamin D3 1000 iu daily      F/U in 3 months    Signed electronically by: Mack Guise, MD  [facility]  [facility] [address] [place], [place] [address] Phone: [phone] FAX: [phone]   CC: Laurey Morale, [place] [address] Phone: [phone]  Fax: [phone]  Return to Endocrinology clinic as below: Future Appointments  Date Time Provider [place]  02/05/2019 11:10 AM Shamleffer, Melanie Crazier, MD [facility] None

## 2018-11-06 ENCOUNTER — Other Ambulatory Visit: Payer: Self-pay

## 2018-11-06 LAB — LIPID PANEL
Cholesterol: 219 mg/dL — ABNORMAL HIGH (ref 0–200)
HDL: 40.1 mg/dL (ref >39.00)
NonHDL: 178.77
Total CHOL/HDL Ratio: 5
Triglycerides: 250 mg/dL — ABNORMAL HIGH (ref 0.0–149.0)
VLDL: 50 mg/dL — ABNORMAL HIGH (ref 0.0–40.0)

## 2018-11-06 LAB — MICROALBUMIN / CREATININE URINE RATIO
Creatinine,U: 110.1 mg/dL
Microalb Creat Ratio: 14.3 mg/g (ref 0.0–30.0)
Microalb, Ur: 15.7 mg/dL — ABNORMAL HIGH (ref 0.0–1.9)

## 2018-11-06 LAB — BASIC METABOLIC PANEL
BUN: 19 mg/dL (ref 6–23)
CO2: 27 mEq/L (ref 19–32)
Calcium: 9.4 mg/dL (ref 8.4–10.5)
Chloride: 103 mEq/L (ref 96–112)
Creatinine, Ser: 1.02 mg/dL (ref 0.40–1.20)
GFR: 56.49 mL/min — ABNORMAL LOW (ref >60.00)
Glucose, Bld: 128 mg/dL — ABNORMAL HIGH (ref 70–99)
Potassium: 4.3 mEq/L (ref 3.5–5.1)
Sodium: 139 mEq/L (ref 135–145)

## 2018-11-06 LAB — TSH: TSH: 2.57 u[IU]/mL (ref 0.35–4.50)

## 2018-11-06 LAB — LDL CHOLESTEROL, DIRECT: Direct LDL: 151 mg/dL

## 2018-11-06 LAB — T4, FREE: Free T4: 0.91 ng/dL (ref 0.60–1.60)

## 2018-11-06 MED ORDER — INSULIN LISPRO PROT & LISPRO (75-25 MIX) 100 UNIT/ML KWIKPEN: 80.0000 [IU] | PEN_INJECTOR | Freq: Two times a day (BID) | SUBCUTANEOUS | 3 refills | Status: DC

## 2018-11-07 ENCOUNTER — Encounter: Payer: Self-pay | Admitting: Internal Medicine

## 2018-11-07 DIAGNOSIS — E785 Hyperlipidemia, unspecified: Secondary | ICD-10-CM | POA: Insufficient documentation

## 2018-11-07 MED ORDER — ATORVASTATIN CALCIUM 10 MG PO TABS: 10.0000 mg | ORAL_TABLET | Freq: Every day | ORAL | 3 refills | Status: DC

## 2018-11-10 ENCOUNTER — Ambulatory Visit: Payer: Self-pay

## 2018-11-10 ENCOUNTER — Telehealth: Payer: Self-pay | Admitting: *Deleted

## 2018-11-10 NOTE — Telephone Encounter (Signed)
Pt. Reports she saw Dr. Sarajane Jews last week with a hernia and has an appointment with Dr. Brantley Stage 11/18/18. States she is worse - having more pain, bloating and constipation. Called the surgeon's office, and they cannot see her sooner. Asking if Dr. Sarajane Jews can ger her in sooner or with a different surgeon. Please advise pt. Answer Assessment - Initial Assessment Questions 1. ONSET:  "When did this first appear?"     Ventral hernia - started 3 weeks ago 2. APPEARANCE: "What does it look like?"     Pooched out 3. SIZE: "How big is it?" (inches, cm or compare to coins, fruit)     Unsure 4. LOCATION: "Where exactly is the hernia located?"     Above belly button 5. PATTERN: "Does the swelling come and go, or has it been constant since it started?"     Constant 6. PAIN: "Is there any pain?" If so, ask: "How bad is it?"  (Scale 1-10; or mild, moderate, severe)     6-7 7. DIAGNOSIS: "Have you been seen by a doctor for this?" "Did the doctor diagnose you as having a hernia?"     Yes - Dr. Sarajane Jews 8. OTHER SYMPTOMS: "Do you have any other symptoms?" (e.g., fever, abdominal pain, vomiting)     Constipation, bloating  9. PREGNANCY: "Is there any chance you are pregnant?" "When was your last menstrual period?"     No  Protocols used: HERNIA-A-AH

## 2018-11-10 NOTE — Telephone Encounter (Signed)
Copied from [place] [id number]. Topic: General - Other >> Nov 10, 2018 10:25 AM Celene Kras A wrote: Reason for CRM: Pts daughter called stating the pts hernia is getting worse and that is causing her constipation, bloating, belching, pain, etc. Pts surgeon cannot see her until next Tuesday. Pts daughter is requesting to have something done sooner. Please advise.

## 2018-11-11 ENCOUNTER — Telehealth: Payer: Medicare Other | Admitting: Family Medicine

## 2018-11-11 ENCOUNTER — Encounter (HOSPITAL_COMMUNITY): Payer: Self-pay

## 2018-11-11 ENCOUNTER — Other Ambulatory Visit: Payer: Self-pay

## 2018-11-11 ENCOUNTER — Emergency Department (HOSPITAL_COMMUNITY)
Admission: EM | Admit: 2018-11-11 | Discharge: 2018-11-12 | Disposition: A | Discharge status: Home / Self Care | Payer: Medicare Other | Attending: Emergency Medicine | Admitting: Emergency Medicine

## 2018-11-11 DIAGNOSIS — E119 Type 2 diabetes mellitus without complications: Secondary | ICD-10-CM | POA: Insufficient documentation

## 2018-11-11 DIAGNOSIS — I2699 Other pulmonary embolism without acute cor pulmonale: Secondary | ICD-10-CM | POA: Insufficient documentation

## 2018-11-11 DIAGNOSIS — Z853 Personal history of malignant neoplasm of breast: Secondary | ICD-10-CM | POA: Diagnosis not present

## 2018-11-11 DIAGNOSIS — Z8673 Personal history of transient ischemic attack (TIA), and cerebral infarction without residual deficits: Secondary | ICD-10-CM | POA: Diagnosis not present

## 2018-11-11 DIAGNOSIS — Z794 Long term (current) use of insulin: Secondary | ICD-10-CM | POA: Insufficient documentation

## 2018-11-11 DIAGNOSIS — Z79899 Other long term (current) drug therapy: Secondary | ICD-10-CM | POA: Diagnosis not present

## 2018-11-11 DIAGNOSIS — N200 Calculus of kidney: Secondary | ICD-10-CM | POA: Diagnosis not present

## 2018-11-11 DIAGNOSIS — R1013 Epigastric pain: Secondary | ICD-10-CM | POA: Diagnosis present

## 2018-11-11 DIAGNOSIS — E039 Hypothyroidism, unspecified: Secondary | ICD-10-CM | POA: Insufficient documentation

## 2018-11-11 DIAGNOSIS — R109 Unspecified abdominal pain: Secondary | ICD-10-CM

## 2018-11-11 DIAGNOSIS — N3000 Acute cystitis without hematuria: Secondary | ICD-10-CM

## 2018-11-11 DIAGNOSIS — Z87891 Personal history of nicotine dependence: Secondary | ICD-10-CM | POA: Insufficient documentation

## 2018-11-11 DIAGNOSIS — J45909 Unspecified asthma, uncomplicated: Secondary | ICD-10-CM | POA: Diagnosis not present

## 2018-11-11 LAB — COMPREHENSIVE METABOLIC PANEL
ALT: 15 U/L (ref 0–44)
AST: 16 U/L (ref 15–41)
Albumin: 3.7 g/dL (ref 3.5–5.0)
Alkaline Phosphatase: 86 U/L (ref 38–126)
Anion gap: 10 (ref 5–15)
BUN: 16 mg/dL (ref 6–20)
CO2: 28 mmol/L (ref 22–32)
Calcium: 9.4 mg/dL (ref 8.9–10.3)
Chloride: 99 mmol/L (ref 98–111)
Creatinine, Ser: 1.02 mg/dL — ABNORMAL HIGH (ref 0.44–1.00)
GFR calc Af Amer: >60 mL/min (ref >60)
GFR calc non Af Amer: >60 mL/min (ref >60)
Glucose, Bld: 245 mg/dL — ABNORMAL HIGH (ref 70–99)
Potassium: 4.7 mmol/L (ref 3.5–5.1)
Sodium: 137 mmol/L (ref 135–145)
Total Bilirubin: 0.4 mg/dL (ref 0.3–1.2)
Total Protein: 7.7 g/dL (ref 6.5–8.1)

## 2018-11-11 LAB — URINALYSIS, ROUTINE W REFLEX MICROSCOPIC
Bilirubin Urine: NEGATIVE
Glucose, UA: NEGATIVE mg/dL
Hgb urine dipstick: NEGATIVE
Ketones, ur: NEGATIVE mg/dL
Nitrite: NEGATIVE
Protein, ur: NEGATIVE mg/dL
Specific Gravity, Urine: 1.017 (ref 1.005–1.030)
WBC, UA: >50 WBC/hpf — ABNORMAL HIGH (ref 0–5)
pH: 5 (ref 5.0–8.0)

## 2018-11-11 LAB — CBC
HCT: 44.4 % (ref 36.0–46.0)
Hemoglobin: 13.2 g/dL (ref 12.0–15.0)
MCH: 23.6 pg — ABNORMAL LOW (ref 26.0–34.0)
MCHC: 29.7 g/dL — ABNORMAL LOW (ref 30.0–36.0)
MCV: 79.4 fL — ABNORMAL LOW (ref 80.0–100.0)
Platelets: 214 10*3/uL (ref 150–400)
RBC: 5.59 MIL/uL — ABNORMAL HIGH (ref 3.87–5.11)
RDW: 18.1 % — ABNORMAL HIGH (ref 11.5–15.5)
WBC: 6.8 10*3/uL (ref 4.0–10.5)
nRBC: 0 % (ref 0.0–0.2)

## 2018-11-11 LAB — I-STAT BETA HCG BLOOD, ED (MC, WL, AP ONLY): I-stat hCG, quantitative: <5 m[IU]/mL (ref <5)

## 2018-11-11 LAB — LIPASE, BLOOD: Lipase: 31 U/L (ref 11–51)

## 2018-11-11 MED ORDER — SODIUM CHLORIDE 0.9% FLUSH: 3.0000 mL | Freq: Once | INTRAVENOUS | Status: DC

## 2018-11-11 NOTE — Progress Notes (Signed)
Virtual Visit via Video Note  I connected with Cheyenne Guzman  on 11/11/18 at  5:20 PM EDT by a video enabled telemedicine application and verified that I am speaking with the correct person using two identifiers.  Location patient: home Location provider:work or home office Persons participating in the virtual visit: patient, provider  I discussed the limitations of evaluation and management by telemedicine and the availability of in person appointments. The patient expressed understanding and agreed to proceed.   HPI:  Spoke with patient. She reports was diagnosed with an abdominal hernia recently by her PCP and was referred to a surgeon. Unfortunately, she has develop worsening pain at the hernia site over the last few days that is worse today. She reports she is not able to stand up straight due to the pain and is not able to touch the bulge as it is painful. She wanted to see if she could bump up her appointment with the Surgeon. A little BM today. No reported bleeding. No fevers, vomiting, diarrhea.  ROS: See pertinent positives and negatives per HPI.  Past Medical History:  Diagnosis Date  . Anxiety    per pt. - mild   . Arthritis    back & ankles   . Asthma    seasonal   . Cancer [facility])    breast, sees Dr. Jana Hakim , Left - 11/2007  . Complication of anesthesia 2013   severe muscle spasms-sore-no doc  . Depression   . Diabetes mellitus    sees Dr. Chalmers Cater, diagnosed 2003  . HA (headache)   . Hearing loss    Went to [facility] Throat,Dr [facility]  . Hyperlipidemia   . Kidney infection    - hosp. [facility]- septic- 10/2009  . Low back pain   . Peripheral neuropathy    R sided numbness- face & jaw  . Sleep apnea 9/15   severe-just started cpap-doing well  . TIA (transient ischemic attack)    in 2007,2008, and 2011  . Tinnitus of right ear   . Urosepsis 08-2009   with Klebsiella    Past Surgical History:  Procedure Laterality Date  . ABDOMINAL HYSTERECTOMY   08/2004  . BREAST BIOPSY  06/22/2011   Procedure: BREAST BIOPSY;  Surgeon: Stark Klein, MD;  Location: [place];  Service: General;  Laterality: Right;  . BREAST SURGERY  11/2007   left ductal carcinoma in situ  . CARPAL TUNNEL RELEASE Right 10/13/2013   Procedure: RIGHT CARPAL TUNNEL RELEASE;  Surgeon: Hessie Dibble, MD;  Location: [place];  Service: Orthopedics;  Laterality: Right;  . CARPAL TUNNEL RELEASE Left 11/24/2013   Procedure: LEFT CARPAL TUNNEL RELEASE WITH CYST EXCISION ;  Surgeon: Hessie Dibble, MD;  Location: [place];  Service: Orthopedics;  Laterality: Left;  . EAR CYST EXCISION Left 11/24/2013   Procedure: CYST REMOVAL;  Surgeon: Hessie Dibble, MD;  Location: [place];  Service: Orthopedics;  Laterality: Left;  . FOOT SURGERY  2000   plantar fasciitis-left  . LUMBAR DISC SURGERY  03-29-14   Fusion, revision 04-27-14 L4-5 Cauda Equina with graft  . NERVE GRAFT  06-01-08   cadaver graft to right inferior alveolar nerve  [place]    Family History  Problem Relation Age of Onset  . Cancer Mother        breast  . Cancer Father        lung  . Diabetes Father   .  Cancer Sister        colon  . Cancer Maternal Aunt        breast - both  . Diabetes Sister   . Anesthesia problems Neg Hx     SOCIAL HX: see hpi   Current Outpatient Medications:  .  albuterol (PROVENTIL HFA;VENTOLIN HFA) 108 (90 Base) MCG/ACT inhaler, Inhale 2 puffs into the lungs every 4 (four) hours as needed for wheezing or shortness of breath., Disp: 1 Inhaler, Rfl: 11 .  albuterol (PROVENTIL) (2.5 MG/3ML) 0.083% nebulizer solution, 1 VIAL IN NEBULIZER EVERY 4 HOURS AS NEEDED FOR WHEEZING (Patient taking differently: Take 2.5 mg by nebulization every 4 (four) hours as needed for wheezing. ), Disp: 75 mL, Rfl: 1 .  ALPRAZolam (XANAX) 1 MG tablet, TAKE 1 TABLET BY MOUTH THREE TIMES A DAY (Patient taking differently: Take 1 mg by mouth 3 (three) times  daily as needed for anxiety. ), Disp: 90 tablet, Rfl: 5 .  atorvastatin (LIPITOR) 10 MG tablet, Take 1 tablet (10 mg total) by mouth at bedtime., Disp: 90 tablet, Rfl: 3 .  AZO-CRANBERRY PO, Take 2 tablets by mouth daily., Disp: , Rfl:  .  Blood Glucose Monitoring Suppl (CONTOUR NEXT MONITOR) w/Device KIT, 1 each by Does not apply route 2 (two) times daily. To check blood sugars twice a day., Disp: 1 kit, Rfl: 0 .  Buprenorphine 15 MCG/HR PTWK, Place 1 patch onto the skin every Saturday. , Disp: , Rfl: 0 .  fluconazole (DIFLUCAN) 150 MG tablet, Take 1 tablet (150 mg total) by mouth every three (3) days as needed (for yeast infection)., Disp: 5 tablet, Rfl: 1 .  furosemide (LASIX) 20 MG tablet, Take 1 tablet (20 mg total) by mouth daily. Take extra 65m as needed if swelling worse. (Patient taking differently: Take 20 mg by mouth daily. ), Disp: 30 tablet, Rfl: 4 .  gabapentin (NEURONTIN) 300 MG capsule, Take 300 mg by mouth 2 (two) times daily. , Disp: , Rfl:  .  glucose blood (CONTOUR NEXT TEST) test strip, Use to test blood sugar 2 times daily, Disp: 100 each, Rfl: 12 .  glucose blood test strip, Use as instructed, Disp: 100 each, Rfl: 0 .  Insulin Lispro Prot & Lispro (HUMALOG MIX 75/25 KWIKPEN) (75-25) 100 UNIT/ML Kwikpen, Inject 80 Units into the skin 2 (two) times daily., Disp: 17 pen, Rfl: 3 .  Insulin Syringe-Needle U-100 (INSULIN SYRINGE 1CC/31GX5/16") 31G X 5/16" 1 ML MISC, 2x daily, Disp: 100 each, Rfl: 11 .  levothyroxine (SYNTHROID) 75 MCG tablet, Take 1 tablet (75 mcg total) by mouth daily., Disp: 90 tablet, Rfl: 3 .  metFORMIN (GLUCOPHAGE) 1000 MG tablet, Take 1 tablet (1,000 mg total) by mouth daily with breakfast. (Patient taking differently: Take 1,000 mg by mouth daily with breakfast. Pt taking 0.5 mg), Disp: 90 tablet, Rfl: 3 .  methocarbamol (ROBAXIN) 750 MG tablet, Take 1 tablet (750 mg total) by mouth 2 (two) times daily. (Patient taking differently: Take 750 mg by mouth 3  (three) times daily. ), Disp: 60 tablet, Rfl: 5 .  Multiple Vitamin (MULTIVITAMIN WITH MINERALS) TABS tablet, Take 1 tablet by mouth daily., Disp: , Rfl:  .  multivitamin (VIT W/EXTRA C) CHEW chewable tablet, Chew by mouth., Disp: , Rfl:  .  rivaroxaban (XARELTO) 20 MG TABS tablet, Take 1 tablet (20 mg total) by mouth daily., Disp: 30 tablet, Rfl: 11 .  senna-docusate (SENOKOT-S) 8.6-50 MG tablet, Take 1 tablet by mouth 2 (two) times daily.,  Disp:  , Rfl:  .  sertraline (ZOLOFT) 100 MG tablet, TAKE 2 TABLETS (200 MG TOTAL) BY MOUTH AT BEDTIME., Disp: 180 tablet, Rfl: 3  EXAM:  VITALS per patient if applicable:  GENERAL: alert, oriented, appears well and in no acute distress  PSYCH/NEURO: pleasant and cooperative, no obvious depression or anxiety, speech and thought processing grossly intact  ASSESSMENT AND PLAN:  Discussed the following assessment and plan:  Abdominal pain, unspecified abdominal location  -we discussed possible serious and likely etiologies, options for evaluation and workup, limitations of telemedicine visit vs in person visit, treatment, treatment risks and precautions. Advised given various potential etiologies (some serious) that she seek inperson care today at the ER for exam and further evaluation. She agrees to do so immediately and feels safe doing so.   I discussed the assessment and treatment plan with the patient. The patient was provided an opportunity to ask questions and all were answered. The patient agreed with the plan and demonstrated an understanding of the instructions.   The patient was advised to call back or seek an in-person evaluation if the symptoms worsen or if the condition fails to improve as anticipated.   Lucretia Kern, DO

## 2018-11-11 NOTE — Telephone Encounter (Signed)
Spoke to pt daughter and she stated they just needed to do a VV. Pt daughter stated that pt sx has increased and doesn't know if she should wait until Tues. I advised that she may want to take pt to the ED. Daughter declined and stated that she would live a VV.    FYI for 520 appt.

## 2018-11-11 NOTE — ED Triage Notes (Signed)
Pt reports abd pain, nausea for 2 weeks, possible hernia, pt has an apt with [facility] surgery next week.

## 2018-11-11 NOTE — Telephone Encounter (Signed)
Cheyenne Guzman, I am happy to see him, but I doubt there is anything I can do over a virtual visit for a hernia. If worsening and pain needs to go to [facility] or ER. Thank you.

## 2018-11-12 ENCOUNTER — Emergency Department (HOSPITAL_COMMUNITY): Payer: Medicare Other

## 2018-11-12 MED ORDER — CEPHALEXIN 500 MG PO CAPS: 500.0000 mg | ORAL_CAPSULE | Freq: Three times a day (TID) | ORAL | 0 refills | Status: DC

## 2018-11-12 MED ORDER — IOHEXOL 300 MG/ML  SOLN
100.0000 mL | Freq: Once | INTRAMUSCULAR | Status: AC | PRN
  Administered 2018-11-12: 100 mL via INTRAVENOUS

## 2018-11-12 NOTE — Discharge Instructions (Addendum)
Begin taking Keflex as prescribed.  Follow-up with general surgery as previously arranged, and return to the ER if you develop worsening pain, high fever, bloody stool, or other new and concerning symptoms.

## 2018-11-12 NOTE — ED Provider Notes (Signed)
[facility] EMERGENCY DEPARTMENT Provider Note   CSN: [id number] Arrival date & time: 11/11/18  2120     History   Chief Complaint Chief Complaint  Patient presents with  . Abdominal Pain  . Nausea    HPI Cheyenne Guzman is a 53 y.o. female.     Patient is a 53 year old female with past medical history of diabetes, hyperlipidemia, TIA, morbid obesity.  She presents today for evaluation of abdominal pain.  She describes pain to the epigastric region for the past 2 weeks in this area.  Patient feels as though there is a bulge.  She has felt nauseated, but has not vomited.  She reports decreased bowel movements, but all have been nonbloody.  The history is provided by the patient.  Abdominal Pain Pain location:  Epigastric Pain quality: cramping   Pain radiates to:  Does not radiate Pain severity:  Moderate Onset quality:  Gradual Duration:  2 weeks Timing:  Constant Progression:  Worsening Chronicity:  New Relieved by:  Nothing Worsened by:  Movement and palpation Ineffective treatments:  None tried   Past Medical History:  Diagnosis Date  . Anxiety    per pt. - mild   . Arthritis    back & ankles   . Asthma    seasonal   . Cancer [facility])    breast, sees Dr. Jana Hakim , Left - 11/2007  . Complication of anesthesia 2013   severe muscle spasms-sore-no doc  . Depression   . Diabetes mellitus    sees Dr. Chalmers Cater, diagnosed 2003  . HA (headache)   . Hearing loss    Went to [facility] Throat,[facility]  . Hyperlipidemia   . Kidney infection    - hosp. [facility]- septic- 10/2009  . Low back pain   . Peripheral neuropathy    R sided numbness- face & jaw  . Sleep apnea 9/15   severe-just started cpap-doing well  . TIA (transient ischemic attack)    in 2007,2008, and 2011  . Tinnitus of right ear   . Urosepsis 08-2009   with Klebsiella    Patient Active Problem List   Diagnosis Date Noted  . Dyslipidemia 11/07/2018  . Type 2  diabetes mellitus with hyperglycemia, with long-term current use of insulin ([place]) 09/18/2018  . Type 2 diabetes mellitus with diabetic polyneuropathy, with long-term current use of insulin ([place]) 09/18/2018  . Recurrent cellulitis of lower extremity 07/31/2018  . Serratia infection 07/31/2018  . Morbid obesity with body mass index of 60.0-69.9 in adult [facility])   . Sepsis ([place]) 06/19/2018  . Chronic pain 06/19/2018  . Pulmonary embolism ([place]) 03/12/2018  . Diabetes mellitus type 2 in obese ([place]) 03/12/2018  . Hypothyroidism 08/14/2017  . Lymphedema of both lower extremities 10/18/2015  . Diabetic neuropathy ([place]) 01/07/2014  . Congenital spondylolysis of lumbosacral region 11/02/2013  . Chronic LBP 10/08/2013  . Obstructive sleep apnea 07/31/2013  . Hearing loss 06/23/2013  . Cellulitis of left lower extremity 07/03/2012  . DCIS (ductal carcinoma in situ) of breast, left, treated with [facility] 11/2007 02/19/2011  . Asthma 02/20/2008  . Hyperlipidemia associated with type 2 diabetes mellitus ([place]) 11/26/2006  . Anxiety and depression 11/26/2006    Past Surgical History:  Procedure Laterality Date  . ABDOMINAL HYSTERECTOMY  08/2004  . BREAST BIOPSY  06/22/2011   Procedure: BREAST BIOPSY;  Surgeon: Stark Klein, MD;  Location: [place];  Service: General;  Laterality: Right;  . BREAST SURGERY  11/2007   left ductal carcinoma in situ  . CARPAL TUNNEL RELEASE Right 10/13/2013   Procedure: RIGHT CARPAL TUNNEL RELEASE;  Surgeon: Hessie Dibble, MD;  Location: [place];  Service: Orthopedics;  Laterality: Right;  . CARPAL TUNNEL RELEASE Left 11/24/2013   Procedure: LEFT CARPAL TUNNEL RELEASE WITH CYST EXCISION ;  Surgeon: Hessie Dibble, MD;  Location: [place];  Service: Orthopedics;  Laterality: Left;  . EAR CYST EXCISION Left 11/24/2013   Procedure: CYST REMOVAL;  Surgeon: Hessie Dibble, MD;  Location: [place];  Service: Orthopedics;   Laterality: Left;  . FOOT SURGERY  2000   plantar fasciitis-left  . LUMBAR DISC SURGERY  03-29-14   Fusion, revision 04-27-14 L4-5 Cauda Equina with graft  . NERVE GRAFT  06-01-08   cadaver graft to right inferior alveolar nerve  in [place]     OB History   No obstetric history on file.      Home Medications    Prior to Admission medications   Medication Sig Start Date End Date Taking? Authorizing Provider  albuterol (PROVENTIL HFA;VENTOLIN HFA) 108 (90 Base) MCG/ACT inhaler Inhale 2 puffs into the lungs every 4 (four) hours as needed for wheezing or shortness of breath. 04/03/17   Laurey Morale, MD  albuterol (PROVENTIL) (2.5 MG/3ML) 0.083% nebulizer solution 1 VIAL IN NEBULIZER EVERY 4 HOURS AS NEEDED FOR WHEEZING Patient taking differently: Take 2.5 mg by nebulization every 4 (four) hours as needed for wheezing.  10/13/13   Laurey Morale, MD  ALPRAZolam Duanne Moron) 1 MG tablet TAKE 1 TABLET BY MOUTH THREE TIMES A DAY Patient taking differently: Take 1 mg by mouth 3 (three) times daily as needed for anxiety.  03/06/18   Laurey Morale, MD  atorvastatin (LIPITOR) 10 MG tablet Take 1 tablet (10 mg total) by mouth at bedtime. 11/07/18   Shamleffer, Melanie Crazier, MD  AZO-CRANBERRY PO Take 2 tablets by mouth daily.    [provider]  Blood Glucose Monitoring Suppl (CONTOUR NEXT MONITOR) w/Device KIT 1 each by Does not apply route 2 (two) times daily. To check blood sugars twice a day. 08/27/16   Renato Shin, MD  Buprenorphine 15 MCG/HR PTWK Place 1 patch onto the skin every Saturday.  07/23/17   [provider]  fluconazole (DIFLUCAN) 150 MG tablet Take 1 tablet (150 mg total) by mouth every three (3) days as needed (for yeast infection). 07/18/18   Rai, Vernelle Emerald, MD  furosemide (LASIX) 20 MG tablet Take 1 tablet (20 mg total) by mouth daily. Take extra 13m as needed if swelling worse. Patient taking differently: Take 20 mg by mouth daily.  07/15/18   Rai, RVernelle Emerald MD   gabapentin (NEURONTIN) 300 MG capsule Take 300 mg by mouth 2 (two) times daily.     [provider]  glucose blood (CONTOUR NEXT TEST) test strip Use to test blood sugar 2 times daily 12/18/16   ERenato Shin MD  glucose blood test strip Use as instructed 08/24/16   ERenato Shin MD  Insulin Lispro Prot & Lispro (HUMALOG MIX 75/25 KWIKPEN) (75-25) 100 UNIT/ML Kwikpen Inject 80 Units into the skin 2 (two) times daily. 11/06/18   Shamleffer, IMelanie Crazier MD  Insulin Syringe-Needle U-100 (INSULIN SYRINGE 1CC/31GX5/16") 31G X 5/16" 1 ML MISC 2x daily 09/17/18   Shamleffer, IMelanie Crazier MD  levothyroxine (SYNTHROID) 75 MCG tablet Take 1 tablet (75 mcg total) by mouth daily. 08/12/18  Laurey Morale, MD  metFORMIN (GLUCOPHAGE) 1000 MG tablet Take 1 tablet (1,000 mg total) by mouth daily with breakfast. Patient taking differently: Take 1,000 mg by mouth daily with breakfast. Pt taking 0.5 mg 09/17/18   Shamleffer, Melanie Crazier, MD  methocarbamol (ROBAXIN) 750 MG tablet Take 1 tablet (750 mg total) by mouth 2 (two) times daily. Patient taking differently: Take 750 mg by mouth 3 (three) times daily.  02/03/15   Laurey Morale, MD  Multiple Vitamin (MULTIVITAMIN WITH MINERALS) TABS tablet Take 1 tablet by mouth daily.    [provider]  multivitamin (VIT W/EXTRA C) CHEW chewable tablet Chew by mouth.    [provider]  rivaroxaban (XARELTO) 20 MG TABS tablet Take 1 tablet (20 mg total) by mouth daily. 10/13/18   Laurey Morale, MD  senna-docusate (SENOKOT-S) 8.6-50 MG tablet Take 1 tablet by mouth 2 (two) times daily. 08/02/18   Eugenie Filler, MD  sertraline (ZOLOFT) 100 MG tablet TAKE 2 TABLETS (200 MG TOTAL) BY MOUTH AT BEDTIME. 07/22/18   Laurey Morale, MD    Family History Family History  Problem Relation Age of Onset  . Cancer Mother        breast  . Cancer Father        lung  . Diabetes Father   . Cancer Sister        colon  . Cancer Maternal Aunt         breast - both  . Diabetes Sister   . Anesthesia problems Neg Hx     Social History Social History   Tobacco Use  . Smoking status: Former Smoker    Quit date: 02/19/1992    Years since quitting: 26.7  . Smokeless tobacco: Never Used  Substance Use Topics  . Alcohol use: Yes    Comment: rare  . Drug use: No     Allergies   Vicodin [hydrocodone-acetaminophen]   Review of Systems Review of Systems  Gastrointestinal: Positive for abdominal pain.  All other systems reviewed and are negative.    Physical Exam Updated Vital Signs BP (!) 158/68 (BP Location: Right Wrist)   Pulse 88   Temp 98.2 F (36.8 C) (Oral)   Resp (!) 24   SpO2 95%   Physical Exam Vitals signs and nursing note reviewed.  Constitutional:      General: She is not in acute distress.    Appearance: She is well-developed. She is not diaphoretic.  HENT:     Head: Normocephalic and atraumatic.  Neck:     Musculoskeletal: Normal range of motion and neck supple.  Cardiovascular:     Rate and Rhythm: Normal rate and regular rhythm.     Heart sounds: No murmur. No friction rub. No gallop.   Pulmonary:     Effort: Pulmonary effort is normal. No respiratory distress.     Breath sounds: Normal breath sounds. No wheezing.  Abdominal:     General: Bowel sounds are normal. There is no distension.     Palpations: Abdomen is soft.     Tenderness: There is abdominal tenderness in the epigastric area. There is no right CVA tenderness, left CVA tenderness, guarding or rebound.  Musculoskeletal: Normal range of motion.  Skin:    General: Skin is warm and dry.  Neurological:     Mental Status: She is alert and oriented to person, place, and time.      ED Treatments / Results  Labs (all labs ordered are  listed, but only abnormal results are displayed) Labs Reviewed  COMPREHENSIVE METABOLIC PANEL - Abnormal; Notable for the following components:      Result Value   Glucose, Bld 245 (*)    Creatinine,  Ser 1.02 (*)    All other components within normal limits  CBC - Abnormal; Notable for the following components:   RBC 5.59 (*)    MCV 79.4 (*)    MCH 23.6 (*)    MCHC 29.7 (*)    RDW 18.1 (*)    All other components within normal limits  URINALYSIS, ROUTINE W REFLEX MICROSCOPIC - Abnormal; Notable for the following components:   APPearance HAZY (*)    Leukocytes,Ua LARGE (*)    WBC, UA >50 (*)    Bacteria, UA MANY (*)    All other components within normal limits  LIPASE, BLOOD  I-STAT BETA HCG BLOOD, ED ([facility], [facility], [facility] ONLY)    EKG None  Radiology No results found.  Procedures Procedures (including critical care time)  Medications Ordered in ED Medications  sodium chloride flush (NS) 0.9 % injection 3 mL (3 mLs Intravenous Not Given 11/12/18 0247)     Initial Impression / Assessment and Plan / ED Course  I have reviewed the triage vital signs and the nursing notes.  Pertinent labs & imaging results that were available during my care of the patient were reviewed by me and considered in my medical decision making (see chart for details).  Patient presents here with complaints of epigastric abdominal pain.  She describes a swelling underneath the skin to her upper abdomen that her primary doctor believed was a hernia.  Patient is work-up reveals her to be afebrile with no white count and normal LFTs and lipase.  CT scan shows no acute abnormality within the abdomen or pelvis.  Upon my review of the CT scan, there appears to be some inflammation in the soft tissues of the upper anterior abdominal wall.  The significance of this I am uncertain, but may represent contusion, developing cellulitis, or possibly an inflamed lipoma.  She does have signs of infection in her urine.  This will be treated with Keflex which I believe is a good choice in case she is developing a cellulitis in the abdominal wall.  Patient does have a follow-up appointment with surgery in the next week.  I have  advised her to keep this appointment and return to the ER if symptoms worsen or change.  Final Clinical Impressions(s) / ED Diagnoses   Final diagnoses:  None    ED Discharge Orders    None       Veryl Speak, MD 11/12/18 [phone]

## 2018-12-05 ENCOUNTER — Telehealth (INDEPENDENT_AMBULATORY_CARE_PROVIDER_SITE_OTHER): Payer: Medicare Other | Admitting: Family Medicine

## 2018-12-05 ENCOUNTER — Encounter: Payer: Self-pay | Admitting: Family Medicine

## 2018-12-05 ENCOUNTER — Other Ambulatory Visit: Payer: Self-pay

## 2018-12-05 DIAGNOSIS — J40 Bronchitis, not specified as acute or chronic: Secondary | ICD-10-CM | POA: Diagnosis not present

## 2018-12-05 DIAGNOSIS — Z20822 Contact with and (suspected) exposure to covid-19: Secondary | ICD-10-CM

## 2018-12-05 MED ORDER — ALBUTEROL SULFATE HFA 108 (90 BASE) MCG/ACT IN AERS: 2.0000 | INHALATION_SPRAY | RESPIRATORY_TRACT | 5 refills | Status: DC | PRN

## 2018-12-05 MED ORDER — AZITHROMYCIN 250 MG PO TABS: ORAL_TABLET | ORAL | 0 refills | Status: DC

## 2018-12-05 NOTE — Progress Notes (Signed)
Virtual Visit via Video Note  I connected with the patient on 12/05/18 at 11:15 AM EST by a video enabled telemedicine application and verified that I am speaking with the correct person using two identifiers.  Location patient: home Location provider:work or home office Persons participating in the virtual visit: patient, provider  I discussed the limitations of evaluation and management by telemedicine and the availability of in person appointments. The patient expressed understanding and agreed to proceed.   HPI: Here for 2 days of PND, chest congestion, SOB, and a dry cough. No chest pain or fever. No headache or ST. No body aches or NVD.    ROS: See pertinent positives and negatives per HPI.  Past Medical History:  Diagnosis Date  . Anxiety    per pt. - mild   . Arthritis    back & ankles   . Asthma    seasonal   . Cancer [facility])    breast, sees Dr. Jana Hakim , Left - 11/2007  . Complication of anesthesia 2013   severe muscle spasms-sore-no doc  . Depression   . Diabetes mellitus    sees Dr. Chalmers Cater, diagnosed 2003  . HA (headache)   . Hearing loss    Went to [facility] and Throat,Dr [facility]  . Hyperlipidemia   . Kidney infection    - hosp. [facility]- septic- 10/2009  . Low back pain   . Peripheral neuropathy    R sided numbness- face & jaw  . Sleep apnea 9/15   severe-just started cpap-doing well  . TIA (transient ischemic attack)    in 2007,2008, and 2011  . Tinnitus of right ear   . Urosepsis 08-2009   with Klebsiella    Past Surgical History:  Procedure Laterality Date  . ABDOMINAL HYSTERECTOMY  08/2004  . BREAST BIOPSY  06/22/2011   Procedure: BREAST BIOPSY;  Surgeon: Stark Klein, MD;  Location: [place];  Service: General;  Laterality: Right;  . BREAST SURGERY  11/2007   left ductal carcinoma in situ  . CARPAL TUNNEL RELEASE Right 10/13/2013   Procedure: RIGHT CARPAL TUNNEL RELEASE;  Surgeon: Hessie Dibble, MD;  Location: [place];  Service: Orthopedics;  Laterality: Right;  . CARPAL TUNNEL RELEASE Left 11/24/2013   Procedure: LEFT CARPAL TUNNEL RELEASE WITH CYST EXCISION ;  Surgeon: Hessie Dibble, MD;  Location: [place];  Service: Orthopedics;  Laterality: Left;  . EAR CYST EXCISION Left 11/24/2013   Procedure: CYST REMOVAL;  Surgeon: Hessie Dibble, MD;  Location: [place];  Service: Orthopedics;  Laterality: Left;  . FOOT SURGERY  2000   plantar fasciitis-left  . LUMBAR DISC SURGERY  03-29-14   Fusion, revision 04-27-14 L4-5 Cauda Equina with graft  . NERVE GRAFT  06-01-08   cadaver graft to right inferior alveolar nerve  [place]    Family History  Problem Relation Age of Onset  . Cancer Mother        breast  . Cancer Father        lung  . Diabetes Father   . Cancer Sister        colon  . Cancer Maternal Aunt        breast - both  . Diabetes Sister   . Anesthesia problems Neg Hx      Current Outpatient Medications:  .  albuterol (PROVENTIL) (2.5 MG/3ML) 0.083% nebulizer solution, 1 VIAL IN NEBULIZER EVERY 4 HOURS AS NEEDED FOR  WHEEZING (Patient taking differently: Take 2.5 mg by nebulization every 4 (four) hours as needed for wheezing. ), Disp: 75 mL, Rfl: 1 .  albuterol (VENTOLIN HFA) 108 (90 Base) MCG/ACT inhaler, Inhale 2 puffs into the lungs every 4 (four) hours as needed for wheezing or shortness of breath., Disp: 18 g, Rfl: 5 .  ALPRAZolam (XANAX) 1 MG tablet, TAKE 1 TABLET BY MOUTH THREE TIMES A DAY (Patient taking differently: Take 1 mg by mouth 3 (three) times daily as needed for anxiety. ), Disp: 90 tablet, Rfl: 5 .  atorvastatin (LIPITOR) 10 MG tablet, Take 1 tablet (10 mg total) by mouth at bedtime., Disp: 90 tablet, Rfl: 3 .  azithromycin (ZITHROMAX Z-PAK) 250 MG tablet, As directed, Disp: 6 each, Rfl: 0 .  AZO-CRANBERRY PO, Take 2 tablets by mouth daily., Disp: , Rfl:  .  Blood Glucose Monitoring Suppl (CONTOUR NEXT MONITOR) w/Device KIT, 1  each by Does not apply route 2 (two) times daily. To check blood sugars twice a day., Disp: 1 kit, Rfl: 0 .  Buprenorphine 15 MCG/HR PTWK, Place 1 patch onto the skin every Saturday. , Disp: , Rfl: 0 .  cephALEXin (KEFLEX) 500 MG capsule, Take 1 capsule (500 mg total) by mouth 3 (three) times daily., Disp: 12 capsule, Rfl: 0 .  fluconazole (DIFLUCAN) 150 MG tablet, Take 1 tablet (150 mg total) by mouth every three (3) days as needed (for yeast infection)., Disp: 5 tablet, Rfl: 1 .  furosemide (LASIX) 20 MG tablet, Take 1 tablet (20 mg total) by mouth daily. Take extra 63m as needed if swelling worse. (Patient taking differently: Take 20 mg by mouth daily. ), Disp: 30 tablet, Rfl: 4 .  gabapentin (NEURONTIN) 300 MG capsule, Take 300 mg by mouth 2 (two) times daily. , Disp: , Rfl:  .  glucose blood (CONTOUR NEXT TEST) test strip, Use to test blood sugar 2 times daily, Disp: 100 each, Rfl: 12 .  glucose blood test strip, Use as instructed, Disp: 100 each, Rfl: 0 .  Insulin Lispro Prot & Lispro (HUMALOG MIX 75/25 KWIKPEN) (75-25) 100 UNIT/ML Kwikpen, Inject 80 Units into the skin 2 (two) times daily., Disp: 17 pen, Rfl: 3 .  Insulin Syringe-Needle U-100 (INSULIN SYRINGE 1CC/31GX5/16") 31G X 5/16" 1 ML MISC, 2x daily, Disp: 100 each, Rfl: 11 .  levothyroxine (SYNTHROID) 75 MCG tablet, Take 1 tablet (75 mcg total) by mouth daily., Disp: 90 tablet, Rfl: 3 .  metFORMIN (GLUCOPHAGE) 1000 MG tablet, Take 1 tablet (1,000 mg total) by mouth daily with breakfast. (Patient taking differently: Take 1,000 mg by mouth daily with breakfast. Pt taking 0.5 mg), Disp: 90 tablet, Rfl: 3 .  methocarbamol (ROBAXIN) 750 MG tablet, Take 1 tablet (750 mg total) by mouth 2 (two) times daily. (Patient taking differently: Take 750 mg by mouth 3 (three) times daily. ), Disp: 60 tablet, Rfl: 5 .  Multiple Vitamin (MULTIVITAMIN WITH MINERALS) TABS tablet, Take 1 tablet by mouth daily., Disp: , Rfl:  .  multivitamin (VIT W/EXTRA C)  CHEW chewable tablet, Chew by mouth., Disp: , Rfl:  .  rivaroxaban (XARELTO) 20 MG TABS tablet, Take 1 tablet (20 mg total) by mouth daily., Disp: 30 tablet, Rfl: 11 .  senna-docusate (SENOKOT-S) 8.6-50 MG tablet, Take 1 tablet by mouth 2 (two) times daily., Disp:  , Rfl:  .  sertraline (ZOLOFT) 100 MG tablet, TAKE 2 TABLETS (200 MG TOTAL) BY MOUTH AT BEDTIME., Disp: 180 tablet, Rfl: 3  EXAM:  VITALS  per patient if applicable:  GENERAL: alert, oriented, appears well and in no acute distress  HEENT: atraumatic, conjunttiva clear, no obvious abnormalities on inspection of external nose and ears  NECK: normal movements of the head and neck  LUNGS: on inspection no signs of respiratory distress, breathing rate appears normal, no obvious gross SOB, gasping or wheezing  CV: no obvious cyanosis  MS: moves all visible extremities without noticeable abnormality  PSYCH/NEURO: pleasant and cooperative, no obvious depression or anxiety, speech and thought processing grossly intact  ASSESSMENT AND PLAN: This is likely an early bronchitis. Treat with a Zpack and use the inhaler prn. She will also get tested today for the Covid-19 virus.  [name], MD  Discussed the following assessment and plan:  No diagnosis found.     I discussed the assessment and treatment plan with the patient. The patient was provided an opportunity to ask questions and all were answered. The patient agreed with the plan and demonstrated an understanding of the instructions.   The patient was advised to call back or seek an in-person evaluation if the symptoms worsen or if the condition fails to improve as anticipated.

## 2018-12-08 LAB — NOVEL CORONAVIRUS, NAA: SARS-CoV-2, NAA: NOT DETECTED

## 2018-12-11 ENCOUNTER — Encounter: Payer: Self-pay | Admitting: Internal Medicine

## 2018-12-11 ENCOUNTER — Other Ambulatory Visit: Payer: Self-pay

## 2018-12-11 MED ORDER — INSULIN LISPRO PROT & LISPRO (75-25 MIX) 100 UNIT/ML ~~LOC~~ SUSP: 80.0000 [IU] | Freq: Two times a day (BID) | SUBCUTANEOUS | 3 refills | Status: DC

## 2018-12-11 MED ORDER — "INSULIN SYRINGE 31G X 5/16"" 1 ML MISC": 11 refills | Status: DC

## 2019-01-01 ENCOUNTER — Telehealth (INDEPENDENT_AMBULATORY_CARE_PROVIDER_SITE_OTHER): Payer: Medicare Other | Admitting: Family Medicine

## 2019-01-01 ENCOUNTER — Encounter: Payer: Self-pay | Admitting: Family Medicine

## 2019-01-01 ENCOUNTER — Other Ambulatory Visit: Payer: Self-pay

## 2019-01-01 DIAGNOSIS — R300 Dysuria: Secondary | ICD-10-CM

## 2019-01-01 MED ORDER — NITROFURANTOIN MONOHYD MACRO 100 MG PO CAPS: 100.0000 mg | ORAL_CAPSULE | Freq: Two times a day (BID) | ORAL | 0 refills | Status: DC

## 2019-01-01 NOTE — Progress Notes (Signed)
Virtual Visit via Video Note  I connected with Cheyenne Guzman  on 01/01/19 at  3:00 PM EST by a video enabled telemedicine application and verified that I am speaking with the correct person using two identifiers.  Location patient: home Location provider:work or home office Persons participating in the virtual visit: patient, provider  I discussed the limitations of evaluation and management by telemedicine and the availability of in person appointments. The patient expressed understanding and agreed to proceed.   HPI:  Acute visit for "UTI" per patient: -reports history of UTIs -she feels like she started getting a UTI a few days ago -symptoms: frequency, urgency, dysuria, odor, low bank -denies fevers, NVD, vaginal discharge, pelvic/flank pain -this feels classic for her for a UTI and she prefers to treat empirically rather then and in-person visit   ROS: See pertinent positives and negatives per HPI.  Past Medical History:  Diagnosis Date  . Anxiety    per pt. - mild   . Arthritis    back & ankles   . Asthma    seasonal   . Cancer [facility])    breast, sees Dr. Jana Hakim , Left - 11/2007  . Complication of anesthesia 2013   severe muscle spasms-sore-no doc  . Depression   . Diabetes mellitus    sees Dr. Chalmers Cater, diagnosed 2003  . HA (headache)   . Hearing loss    Went to [facility]  . Hyperlipidemia   . Kidney infection    - hosp. [facility]- septic- 10/2009  . Low back pain   . Peripheral neuropathy    R sided numbness- face & jaw  . Sleep apnea 9/15   severe-just started cpap-doing well  . TIA (transient ischemic attack)    in 2007,2008, and 2011  . Tinnitus of right ear   . Urosepsis 08-2009   with Klebsiella    Past Surgical History:  Procedure Laterality Date  . ABDOMINAL HYSTERECTOMY  08/2004  . BREAST BIOPSY  06/22/2011   Procedure: BREAST BIOPSY;  Surgeon: Stark Klein, MD;  Location: [place];  Service: General;  Laterality: Right;  .  BREAST SURGERY  11/2007   left ductal carcinoma in situ  . CARPAL TUNNEL RELEASE Right 10/13/2013   Procedure: RIGHT CARPAL TUNNEL RELEASE;  Surgeon: Hessie Dibble, MD;  Location: [place];  Service: Orthopedics;  Laterality: Right;  . CARPAL TUNNEL RELEASE Left 11/24/2013   Procedure: LEFT CARPAL TUNNEL RELEASE WITH CYST EXCISION ;  Surgeon: Hessie Dibble, MD;  Location: [place];  Service: Orthopedics;  Laterality: Left;  . EAR CYST EXCISION Left 11/24/2013   Procedure: CYST REMOVAL;  Surgeon: Hessie Dibble, MD;  Location: [place];  Service: Orthopedics;  Laterality: Left;  . FOOT SURGERY  2000   plantar fasciitis-left  . LUMBAR DISC SURGERY  03-29-14   Fusion, revision 04-27-14 L4-5 Cauda Equina with graft  . NERVE GRAFT  06-01-08   cadaver graft to right inferior alveolar nerve  in [place]    Family History  Problem Relation Age of Onset  . Cancer Mother        breast  . Cancer Father        lung  . Diabetes Father   . Cancer Sister        colon  . Cancer Maternal Aunt        breast - both  . Diabetes Sister   . Anesthesia problems Neg  Hx     SOCIAL HX: see hpi   Current Outpatient Medications:  .  albuterol (PROVENTIL) (2.5 MG/3ML) 0.083% nebulizer solution, 1 VIAL IN NEBULIZER EVERY 4 HOURS AS NEEDED FOR WHEEZING (Patient taking differently: Take 2.5 mg by nebulization every 4 (four) hours as needed for wheezing. ), Disp: 75 mL, Rfl: 1 .  albuterol (VENTOLIN HFA) 108 (90 Base) MCG/ACT inhaler, Inhale 2 puffs into the lungs every 4 (four) hours as needed for wheezing or shortness of breath., Disp: 18 g, Rfl: 5 .  ALPRAZolam (XANAX) 1 MG tablet, TAKE 1 TABLET BY MOUTH THREE TIMES A DAY (Patient taking differently: Take 1 mg by mouth 3 (three) times daily as needed for anxiety. ), Disp: 90 tablet, Rfl: 5 .  atorvastatin (LIPITOR) 10 MG tablet, Take 1 tablet (10 mg total) by mouth at bedtime., Disp: 90 tablet, Rfl: 3 .   azithromycin (ZITHROMAX Z-PAK) 250 MG tablet, As directed, Disp: 6 each, Rfl: 0 .  AZO-CRANBERRY PO, Take 2 tablets by mouth daily., Disp: , Rfl:  .  Blood Glucose Monitoring Suppl (CONTOUR NEXT MONITOR) w/Device KIT, 1 each by Does not apply route 2 (two) times daily. To check blood sugars twice a day., Disp: 1 kit, Rfl: 0 .  Buprenorphine 15 MCG/HR PTWK, Place 1 patch onto the skin every Saturday. , Disp: , Rfl: 0 .  cephALEXin (KEFLEX) 500 MG capsule, Take 1 capsule (500 mg total) by mouth 3 (three) times daily., Disp: 12 capsule, Rfl: 0 .  fluconazole (DIFLUCAN) 150 MG tablet, Take 1 tablet (150 mg total) by mouth every three (3) days as needed (for yeast infection)., Disp: 5 tablet, Rfl: 1 .  furosemide (LASIX) 20 MG tablet, Take 1 tablet (20 mg total) by mouth daily. Take extra 62m as needed if swelling worse. (Patient taking differently: Take 20 mg by mouth daily. ), Disp: 30 tablet, Rfl: 4 .  gabapentin (NEURONTIN) 300 MG capsule, Take 300 mg by mouth 2 (two) times daily. , Disp: , Rfl:  .  glucose blood (CONTOUR NEXT TEST) test strip, Use to test blood sugar 2 times daily, Disp: 100 each, Rfl: 12 .  glucose blood test strip, Use as instructed, Disp: 100 each, Rfl: 0 .  insulin lispro protamine-lispro (HUMALOG 75/25 MIX) (75-25) 100 UNIT/ML SUSP injection, Inject 80 Units into the skin 2 (two) times daily with a meal. Inject 80 unit under the skin twice daily, Disp: 50 mL, Rfl: 3 .  Insulin Syringe-Needle U-100 (INSULIN SYRINGE 1CC/31GX5/16") 31G X 5/16" 1 ML MISC, 2x daily, Disp: 100 each, Rfl: 11 .  levothyroxine (SYNTHROID) 75 MCG tablet, Take 1 tablet (75 mcg total) by mouth daily., Disp: 90 tablet, Rfl: 3 .  metFORMIN (GLUCOPHAGE) 1000 MG tablet, Take 1 tablet (1,000 mg total) by mouth daily with breakfast. (Patient taking differently: Take 1,000 mg by mouth daily with breakfast. Pt taking 0.5 mg), Disp: 90 tablet, Rfl: 3 .  methocarbamol (ROBAXIN) 750 MG tablet, Take 1 tablet (750 mg  total) by mouth 2 (two) times daily. (Patient taking differently: Take 750 mg by mouth 3 (three) times daily. ), Disp: 60 tablet, Rfl: 5 .  Multiple Vitamin (MULTIVITAMIN WITH MINERALS) TABS tablet, Take 1 tablet by mouth daily., Disp: , Rfl:  .  multivitamin (VIT W/EXTRA C) CHEW chewable tablet, Chew by mouth., Disp: , Rfl:  .  nitrofurantoin, macrocrystal-monohydrate, (MACROBID) 100 MG capsule, Take 1 capsule (100 mg total) by mouth 2 (two) times daily., Disp: 14 capsule, Rfl: 0 .  rivaroxaban (XARELTO) 20 MG TABS tablet, Take 1 tablet (20 mg total) by mouth daily., Disp: 30 tablet, Rfl: 11 .  senna-docusate (SENOKOT-S) 8.6-50 MG tablet, Take 1 tablet by mouth 2 (two) times daily., Disp:  , Rfl:  .  sertraline (ZOLOFT) 100 MG tablet, TAKE 2 TABLETS (200 MG TOTAL) BY MOUTH AT BEDTIME., Disp: 180 tablet, Rfl: 3  EXAM:  VITALS per patient if applicable:denies fevers  GENERAL: alert, oriented, appears well and in no acute distress  HEENT: atraumatic, conjunttiva clear, no obvious abnormalities on inspection of external nose and ears  NECK: normal movements of the head and neck  LUNGS: on inspection no signs of respiratory distress, breathing rate appears normal, no obvious gross SOB, gasping or wheezing  CV: no obvious cyanosis  MS: moves all visible extremities without noticeable abnormality  PSYCH/NEURO: pleasant and cooperative, no obvious depression or anxiety, speech and thought processing grossly intact  ASSESSMENT AND PLAN:  Discussed the following assessment and plan:  Dysuria  -we discussed possible serious and likely etiologies, options for evaluation and workup, limitations of telemedicine visit vs in person visit, treatment, treatment risks and precautions. Pt prefers to treat via telemedicine empirically rather then risking or undertaking an in person visit at this moment. Suspect UTI given classic symptoms and prior history. She opted to try Macrobid 14mbid x 7 days.  Patient agrees to seek prompt in person care or re-evaluation if worsening, new symptoms arise, or if is not improving with treatment.   I discussed the assessment and treatment plan with the patient. The patient was provided an opportunity to ask questions and all were answered. The patient agreed with the plan and demonstrated an understanding of the instructions.   The patient was advised to call back or seek an in-person evaluation if the symptoms worsen or if the condition fails to improve as anticipated.   HLucretia Kern DO   Patient Instructions   -I sent the medication(s) we discussed to your pharmacy: Meds ordered this encounter  Medications  . nitrofurantoin, macrocrystal-monohydrate, (MACROBID) 100 MG capsule    Sig: Take 1 capsule (100 mg total) by mouth 2 (two) times daily.    Dispense:  14 capsule    Refill:  0    Please let [place]now if you have any questions or concerns regarding this prescription.  I hope you are feeling better soon! Seek care promptly if your symptoms worsen, new concerns arise or you are not improving with treatment.

## 2019-01-01 NOTE — Patient Instructions (Signed)
-  I sent the medication(s) we discussed to your pharmacy: °Meds ordered this encounter  °Medications  °• nitrofurantoin, macrocrystal-monohydrate, (MACROBID) 100 MG capsule  °  Sig: Take 1 capsule (100 mg total) by mouth 2 (two) times daily.  °  Dispense:  14 capsule  °  Refill:  0  ° ° °Please let us know if you have any questions or concerns regarding this prescription. ° °I hope you are feeling better soon! °Seek care promptly if your symptoms worsen, new concerns arise or you are not improving with treatment. ° °

## 2019-01-22 ENCOUNTER — Telehealth: Payer: Self-pay

## 2019-01-22 NOTE — Telephone Encounter (Signed)
Copied from [place] [id number]. Topic: General - Other >> Jan 22, 2019 12:53 PM Mcneil, Ja-Kwan wrote: Reason for CRM: Pt stated she possibly has a kidney infection or an UTI and Dr. Sarajane Jews normally sends an antibiotic to her pharmacy. Pt stated that the antibiotic that was prescribed by Dr. Maudie Mercury did not work. Pt requests call back

## 2019-01-26 MED ORDER — CIPROFLOXACIN HCL 500 MG PO TABS: 500.0000 mg | ORAL_TABLET | Freq: Two times a day (BID) | ORAL | 0 refills | Status: DC

## 2019-01-26 NOTE — Addendum Note (Signed)
Addended by: Rebecca Eaton on: 01/26/2019 10:28 AM   Modules accepted: Orders

## 2019-01-26 NOTE — Telephone Encounter (Signed)
Rx has been sent in. Patient is aware. 

## 2019-01-26 NOTE — Telephone Encounter (Signed)
Call in Cipro 500 mg bid for 7 days  

## 2019-02-05 ENCOUNTER — Ambulatory Visit: Payer: Medicare Other | Admitting: Internal Medicine

## 2019-03-26 ENCOUNTER — Encounter: Payer: Self-pay | Admitting: Family Medicine

## 2019-03-27 MED ORDER — CIPROFLOXACIN HCL 500 MG PO TABS: 500.0000 mg | ORAL_TABLET | Freq: Two times a day (BID) | ORAL | 0 refills | Status: DC

## 2019-03-27 NOTE — Telephone Encounter (Signed)
Call in Cipro 500 mg bid for 10 days  

## 2019-04-09 ENCOUNTER — Emergency Department (HOSPITAL_COMMUNITY): Payer: Medicare Other

## 2019-04-09 ENCOUNTER — Telehealth: Payer: Self-pay | Admitting: Family Medicine

## 2019-04-09 ENCOUNTER — Emergency Department (HOSPITAL_BASED_OUTPATIENT_CLINIC_OR_DEPARTMENT_OTHER): Payer: Medicare Other

## 2019-04-09 ENCOUNTER — Emergency Department (HOSPITAL_COMMUNITY)
Admission: EM | Admit: 2019-04-09 | Discharge: 2019-04-09 | Disposition: A | Discharge status: Home / Self Care | Payer: Medicare Other | Attending: Emergency Medicine | Admitting: Emergency Medicine

## 2019-04-09 ENCOUNTER — Other Ambulatory Visit: Payer: Self-pay

## 2019-04-09 DIAGNOSIS — Z79899 Other long term (current) drug therapy: Secondary | ICD-10-CM | POA: Insufficient documentation

## 2019-04-09 DIAGNOSIS — R0602 Shortness of breath: Secondary | ICD-10-CM | POA: Diagnosis not present

## 2019-04-09 DIAGNOSIS — J45909 Unspecified asthma, uncomplicated: Secondary | ICD-10-CM | POA: Insufficient documentation

## 2019-04-09 DIAGNOSIS — R609 Edema, unspecified: Secondary | ICD-10-CM

## 2019-04-09 DIAGNOSIS — Z87891 Personal history of nicotine dependence: Secondary | ICD-10-CM | POA: Diagnosis not present

## 2019-04-09 DIAGNOSIS — Z7901 Long term (current) use of anticoagulants: Secondary | ICD-10-CM | POA: Insufficient documentation

## 2019-04-09 DIAGNOSIS — R072 Precordial pain: Secondary | ICD-10-CM | POA: Insufficient documentation

## 2019-04-09 DIAGNOSIS — R079 Chest pain, unspecified: Secondary | ICD-10-CM | POA: Diagnosis not present

## 2019-04-09 DIAGNOSIS — E119 Type 2 diabetes mellitus without complications: Secondary | ICD-10-CM | POA: Diagnosis not present

## 2019-04-09 DIAGNOSIS — Z794 Long term (current) use of insulin: Secondary | ICD-10-CM | POA: Diagnosis not present

## 2019-04-09 LAB — CBC
HCT: 46 % (ref 36.0–46.0)
Hemoglobin: 13.9 g/dL (ref 12.0–15.0)
MCH: 24.6 pg — ABNORMAL LOW (ref 26.0–34.0)
MCHC: 30.2 g/dL (ref 30.0–36.0)
MCV: 81.4 fL (ref 80.0–100.0)
Platelets: 167 10*3/uL (ref 150–400)
RBC: 5.65 MIL/uL — ABNORMAL HIGH (ref 3.87–5.11)
RDW: 15.7 % — ABNORMAL HIGH (ref 11.5–15.5)
WBC: 6.3 10*3/uL (ref 4.0–10.5)
nRBC: 0 % (ref 0.0–0.2)

## 2019-04-09 LAB — URINALYSIS, ROUTINE W REFLEX MICROSCOPIC
Bilirubin Urine: NEGATIVE
Glucose, UA: NEGATIVE mg/dL
Hgb urine dipstick: NEGATIVE
Ketones, ur: NEGATIVE mg/dL
Nitrite: NEGATIVE
Protein, ur: NEGATIVE mg/dL
Specific Gravity, Urine: 1.02 (ref 1.005–1.030)
WBC, UA: >50 WBC/hpf — ABNORMAL HIGH (ref 0–5)
pH: 5 (ref 5.0–8.0)

## 2019-04-09 LAB — BASIC METABOLIC PANEL
Anion gap: 12 (ref 5–15)
BUN: 14 mg/dL (ref 6–20)
CO2: 24 mmol/L (ref 22–32)
Calcium: 9.5 mg/dL (ref 8.9–10.3)
Chloride: 101 mmol/L (ref 98–111)
Creatinine, Ser: 0.92 mg/dL (ref 0.44–1.00)
GFR calc Af Amer: >60 mL/min (ref >60)
GFR calc non Af Amer: >60 mL/min (ref >60)
Glucose, Bld: 222 mg/dL — ABNORMAL HIGH (ref 70–99)
Potassium: 4.4 mmol/L (ref 3.5–5.1)
Sodium: 137 mmol/L (ref 135–145)

## 2019-04-09 LAB — TROPONIN I (HIGH SENSITIVITY)
Troponin I (High Sensitivity): 3 ng/L (ref <18)
Troponin I (High Sensitivity): 4 ng/L (ref <18)

## 2019-04-09 LAB — PROTIME-INR
INR: 2 — ABNORMAL HIGH (ref 0.8–1.2)
Prothrombin Time: 22.5 seconds — ABNORMAL HIGH (ref 11.4–15.2)

## 2019-04-09 MED ORDER — SODIUM CHLORIDE 0.9 % IV BOLUS
500.0000 mL | Freq: Once | INTRAVENOUS | Status: AC
  Administered 2019-04-09: 15:00:00 500 mL via INTRAVENOUS

## 2019-04-09 MED ORDER — IOHEXOL 350 MG/ML SOLN
100.0000 mL | Freq: Once | INTRAVENOUS | Status: AC | PRN
  Administered 2019-04-09: 100 mL via INTRAVENOUS

## 2019-04-09 NOTE — Telephone Encounter (Signed)
Pt states that she has been having R. Knee pain behind her knee since last night and SOB for a few days now. Last weekend pt had redness on her R lower leg between the calf and foot with a lil tenderness. She didn't think much about it b/c it went away. Pt states she has a history of blood clots and believes that is what she has. The spot behind her knee aches and throbs-when she points her toes the pain is worse.   Pt requested to speak to the Nurse Triage-transferred pt.  Pt can be reached at [phone]

## 2019-04-09 NOTE — ED Provider Notes (Addendum)
[facility] Provider Note   CSN: [id number] Arrival date & time: 04/09/19  1302     History Chief Complaint  Patient presents with  . Shortness of Breath  . Leg Pain  . Cough  . Back Pain    Cheyenne Guzman is a 54 y.o. female with medical history significant for seasonal asthma, anxiety, chronic back pain, PE, DVT, TIA who presents for evaluation of leg pain and cough.  Patient states she noticed last week she had pain to her right popliteal fossa.  Patient states she noted at that time she had some erythema and warmth however this resolved.  She continues to have some generalized aching to this area.  Patient also notes she has been short of breath x1 week.  Has been using her inhaler as she thought this was her asthma.  She does have pleuritic chest pain however not exertional chest pain.  No radiation into her left arm, left jaw.  Associated nausea, vomiting, diaphoresis.  Patient states she has had a nonproductive cough.  She does have back pain however states she does have some chronic back pain.  Patient states she does get short of breath with her allergies.  Has had some mild rhinorrhea.  No recent surgery, immobilization. Admitted last year so similar symptoms with DVT and PE. Negative coag work up for prior admission. Thought likely 2/2 obesity and sedentary lifestyle.   Anticoagulated on Xarelto, has not missed doses.  History obtained from patient and past medical history. No interpretor was used.  HPI     Past Medical History:  Diagnosis Date  . Anxiety    per pt. - mild   . Arthritis    back & ankles   . Asthma    seasonal   . Cancer [facility])    breast, sees Dr. Jana Hakim , Left - 11/2007  . Complication of anesthesia 2013   severe muscle spasms-sore-no doc  . Depression   . Diabetes mellitus    sees Dr. Chalmers Cater, diagnosed 2003  . HA (headache)   . Hearing loss    Went to [facility]  .  Hyperlipidemia   . Kidney infection    - hosp. [facility]- septic- 10/2009  . Low back pain   . Peripheral neuropathy    R sided numbness- face & jaw  . Sleep apnea 9/15   severe-just started cpap-doing well  . TIA (transient ischemic attack)    in 2007,2008, and 2011  . Tinnitus of right ear   . Urosepsis 08-2009   with Klebsiella    Patient Active Problem List   Diagnosis Date Noted  . Dyslipidemia 11/07/2018  . Type 2 diabetes mellitus with hyperglycemia, with long-term current use of insulin ([place]) 09/18/2018  . Type 2 diabetes mellitus with diabetic polyneuropathy, with long-term current use of insulin ([place]) 09/18/2018  . Recurrent cellulitis of lower extremity 07/31/2018  . Serratia infection 07/31/2018  . Morbid obesity with body mass index of 60.0-69.9 in adult [facility])   . Sepsis ([place]) 06/19/2018  . Chronic pain 06/19/2018  . Pulmonary embolism ([place]) 03/12/2018  . Diabetes mellitus type 2 in obese ([place]) 03/12/2018  . Hypothyroidism 08/14/2017  . Lymphedema of both lower extremities 10/18/2015  . Diabetic neuropathy ([place]) 01/07/2014  . Congenital spondylolysis of lumbosacral region 11/02/2013  . Chronic LBP 10/08/2013  . Obstructive sleep apnea 07/31/2013  . Hearing loss 06/23/2013  . Cellulitis of left lower extremity 07/03/2012  .  DCIS (ductal carcinoma in situ) of breast, left, treated with [facility] 11/2007 02/19/2011  . Asthma 02/20/2008  . Hyperlipidemia associated with type 2 diabetes mellitus ([place]) 11/26/2006  . Anxiety and depression 11/26/2006    Past Surgical History:  Procedure Laterality Date  . ABDOMINAL HYSTERECTOMY  08/2004  . BREAST BIOPSY  06/22/2011   Procedure: BREAST BIOPSY;  Surgeon: Stark Klein, MD;  Location: [place];  Service: General;  Laterality: Right;  . BREAST SURGERY  11/2007   left ductal carcinoma in situ  . CARPAL TUNNEL RELEASE Right 10/13/2013   Procedure: RIGHT CARPAL TUNNEL RELEASE;  Surgeon: Hessie Dibble, MD;  Location: [facility];  Service: Orthopedics;  Laterality: Right;  . CARPAL TUNNEL RELEASE Left 11/24/2013   Procedure: LEFT CARPAL TUNNEL RELEASE WITH CYST EXCISION ;  Surgeon: Hessie Dibble, MD;  Location: [place];  Service: Orthopedics;  Laterality: Left;  . EAR CYST EXCISION Left 11/24/2013   Procedure: CYST REMOVAL;  Surgeon: Hessie Dibble, MD;  Location: [place];  Service: Orthopedics;  Laterality: Left;  . FOOT SURGERY  2000   plantar fasciitis-left  . LUMBAR DISC SURGERY  03-29-14   Fusion, revision 04-27-14 L4-5 Cauda Equina with graft  . NERVE GRAFT  06-01-08   cadaver graft to right inferior alveolar nerve  in [place]     OB History   No obstetric history on file.     Family History  Problem Relation Age of Onset  . Cancer Mother        breast  . Cancer Father        lung  . Diabetes Father   . Cancer Sister        colon  . Cancer Maternal Aunt        breast - both  . Diabetes Sister   . Anesthesia problems Neg Hx     Social History   Tobacco Use  . Smoking status: Former Smoker    Quit date: 02/19/1992    Years since quitting: 27.1  . Smokeless tobacco: Never Used  Substance Use Topics  . Alcohol use: Yes    Comment: rare  . Drug use: No    Home Medications Prior to Admission medications   Medication Sig Start Date End Date Taking? Authorizing Provider  albuterol (PROVENTIL) (2.5 MG/3ML) 0.083% nebulizer solution 1 VIAL IN NEBULIZER EVERY 4 HOURS AS NEEDED FOR WHEEZING Patient taking differently: Take 2.5 mg by nebulization every 4 (four) hours as needed for wheezing.  10/13/13  Yes Laurey Morale, MD  albuterol (VENTOLIN HFA) 108 (90 Base) MCG/ACT inhaler Inhale 2 puffs into the lungs every 4 (four) hours as needed for wheezing or shortness of breath. 12/05/18  Yes Laurey Morale, MD  atorvastatin (LIPITOR) 10 MG tablet Take 1 tablet (10 mg total) by mouth at bedtime. 11/07/18  Yes Shamleffer, Melanie Crazier, MD  Blood Glucose  Monitoring Suppl (CONTOUR NEXT MONITOR) w/Device KIT 1 each by Does not apply route 2 (two) times daily. To check blood sugars twice a day. 08/27/16  Yes Renato Shin, MD  Buprenorphine 15 MCG/HR PTWK Place 1 patch onto the skin every Saturday.  07/23/17  Yes [provider]  ciprofloxacin (CIPRO) 500 MG tablet Take 1 tablet (500 mg total) by mouth 2 (two) times daily. 03/27/19  Yes Laurey Morale, MD  gabapentin (NEURONTIN) 300 MG capsule Take 300 mg by mouth 2 (two) times daily.    Yes [provider]  glucose blood (CONTOUR NEXT TEST) test strip Use to test blood sugar 2 times daily 12/18/16  Yes Renato Shin, MD  glucose blood test strip Use as instructed 08/24/16  Yes Renato Shin, MD  insulin lispro protamine-lispro (HUMALOG 75/25 MIX) (75-25) 100 UNIT/ML SUSP injection Inject 80 Units into the skin 2 (two) times daily with a meal. Inject 80 unit under the skin twice daily 12/11/18  Yes Shamleffer, Melanie Crazier, MD  Insulin Syringe-Needle U-100 (INSULIN SYRINGE 1CC/31GX5/16") 31G X 5/16" 1 ML MISC 2x daily 12/11/18  Yes Shamleffer, Melanie Crazier, MD  levothyroxine (SYNTHROID) 75 MCG tablet Take 1 tablet (75 mcg total) by mouth daily. 08/12/18  Yes Laurey Morale, MD  metFORMIN (GLUCOPHAGE) 1000 MG tablet Take 1 tablet (1,000 mg total) by mouth daily with breakfast. 09/17/18  Yes Shamleffer, Melanie Crazier, MD  methocarbamol (ROBAXIN) 750 MG tablet Take 1 tablet (750 mg total) by mouth 2 (two) times daily. Patient taking differently: Take 750 mg by mouth 3 (three) times daily.  02/03/15  Yes Laurey Morale, MD  Multiple Vitamin (MULTIVITAMIN WITH MINERALS) TABS tablet Take 1 tablet by mouth daily.   Yes [provider]  multivitamin (VIT W/EXTRA C) CHEW chewable tablet Chew 1 tablet by mouth daily.    Yes [provider]  rivaroxaban (XARELTO) 20 MG TABS tablet Take 1 tablet (20 mg total) by mouth daily. 10/13/18  Yes Laurey Morale, MD  sertraline (ZOLOFT) 100 MG  tablet TAKE 2 TABLETS (200 MG TOTAL) BY MOUTH AT BEDTIME. Patient taking differently: Take 200 mg by mouth daily.  07/22/18  Yes Laurey Morale, MD  ALPRAZolam Duanne Moron) 1 MG tablet TAKE 1 TABLET BY MOUTH THREE TIMES A DAY Patient not taking: No sig reported 03/06/18   Laurey Morale, MD  azithromycin (ZITHROMAX Z-PAK) 250 MG tablet As directed Patient not taking: Reported on 04/09/2019 12/05/18   Laurey Morale, MD  cephALEXin (KEFLEX) 500 MG capsule Take 1 capsule (500 mg total) by mouth 3 (three) times daily. Patient not taking: Reported on 04/09/2019 11/12/18   Veryl Speak, MD  ciprofloxacin (CIPRO) 500 MG tablet Take 1 tablet (500 mg total) by mouth 2 (two) times daily. Patient not taking: Reported on 04/09/2019 01/26/19   Laurey Morale, MD  fluconazole (DIFLUCAN) 150 MG tablet Take 1 tablet (150 mg total) by mouth every three (3) days as needed (for yeast infection). Patient not taking: Reported on 04/09/2019 07/18/18   Rai, Vernelle Emerald, MD  furosemide (LASIX) 20 MG tablet Take 1 tablet (20 mg total) by mouth daily. Take extra 52m as needed if swelling worse. Patient not taking: Reported on 04/09/2019 07/15/18   Rai, RVernelle Emerald MD  nitrofurantoin, macrocrystal-monohydrate, (MACROBID) 100 MG capsule Take 1 capsule (100 mg total) by mouth 2 (two) times daily. Patient not taking: Reported on 04/09/2019 01/01/19   KLucretia Kern DO  senna-docusate (SENOKOT-S) 8.6-50 MG tablet Take 1 tablet by mouth 2 (two) times daily. Patient not taking: Reported on 04/09/2019 08/02/18   TEugenie Filler MD    Allergies    Vicodin [hydrocodone-acetaminophen]  Review of Systems   Review of Systems  Constitutional: Negative.   HENT: Negative.   Respiratory: Positive for cough and shortness of breath.   Cardiovascular: Positive for chest pain (Pleuritic intermittent). Negative for palpitations and leg swelling.  Gastrointestinal: Negative.   Genitourinary: Negative.   Musculoskeletal: Positive for back pain  (Chronic). Negative for arthralgias, gait problem, joint swelling, myalgias, neck  pain and neck stiffness.       Right leg pain  Skin: Negative.   Neurological: Negative.   All other systems reviewed and are negative.   Physical Exam Updated Vital Signs BP 100/82   Pulse 79   Temp 98 F (36.7 C) (Oral)   Resp 18   Ht '5\' 7"'  (1.702 m)   Wt (!) 176.9 kg   SpO2 97%   BMI 61.08 kg/m   Physical Exam Vitals and nursing note reviewed.  Constitutional:      General: She is not in acute distress.    Appearance: She is well-developed. She is obese. She is not ill-appearing or toxic-appearing.  HENT:     Head: Normocephalic and atraumatic.  Eyes:     Pupils: Pupils are equal, round, and reactive to light.  Cardiovascular:     Rate and Rhythm: Normal rate.     Pulses: Normal pulses.     Heart sounds: Normal heart sounds.  Pulmonary:     Effort: Pulmonary effort is normal. No respiratory distress.     Breath sounds: Normal breath sounds.     Comments: Clear to auscultation bilaterally. Speaks in full sentences without difficulty. Abdominal:     General: There is no distension.     Palpations: Abdomen is soft.  Musculoskeletal:        General: Normal range of motion.     Cervical back: Normal range of motion.     Comments: Tenderness to right popliteal fossa. No edema, erythema, warmth.  Skin:    General: Skin is warm and dry.     Capillary Refill: Capillary refill takes less than 2 seconds.     Coloration: Skin is not pale.     Findings: No erythema or rash.     Comments: Brisk cap refill.  Neurological:     General: No focal deficit present.     Mental Status: She is alert.    ED Results / Procedures / Treatments   Labs (all labs ordered are listed, but only abnormal results are displayed) Labs Reviewed  BASIC METABOLIC PANEL - Abnormal; Notable for the following components:      Result Value   Glucose, Bld 222 (*)    All other components within normal limits  CBC -  Abnormal; Notable for the following components:   RBC 5.65 (*)    MCH 24.6 (*)    RDW 15.7 (*)    All other components within normal limits  PROTIME-INR - Abnormal; Notable for the following components:   Prothrombin Time 22.5 (*)    INR 2.0 (*)    All other components within normal limits  URINALYSIS, ROUTINE W REFLEX MICROSCOPIC - Abnormal; Notable for the following components:   APPearance HAZY (*)    Leukocytes,Ua MODERATE (*)    WBC, UA >50 (*)    Bacteria, UA FEW (*)    All other components within normal limits  TROPONIN I (HIGH SENSITIVITY)  TROPONIN I (HIGH SENSITIVITY)    EKG EKG Interpretation  Date/Time:  Thursday April 09 2019 13:04:52 EDT Ventricular Rate:  80 PR Interval:  144 QRS Duration: 86 QT Interval:  392 QTC Calculation: 452 R Axis:   -21 Text Interpretation: Normal sinus rhythm Low voltage QRS Borderline ECG No STEMI Confirmed by Nanda Quinton [phone]) on 04/09/2019 7:10:35 PM   Radiology DG Chest 2 View  Result Date: 04/09/2019 CLINICAL DATA:  Shortness of breath and pain EXAM: CHEST - 2 VIEW COMPARISON:  Jun 19, 2018  FINDINGS: Lungs are clear. Heart size and pulmonary vascularity are normal. No adenopathy. No bone lesions. No pneumothorax. IMPRESSION: Lungs clear.  Cardiac silhouette within normal limits. Electronically Signed   By: Lowella Grip III M.D.   On: 04/09/2019 13:34   CT Angio Chest PE W and/or Wo Contrast  Result Date: 04/09/2019 CLINICAL DATA:  Shortness of breath. Upper back pain. EXAM: CT ANGIOGRAPHY CHEST WITH CONTRAST TECHNIQUE: Multidetector CT imaging of the chest was performed using the standard protocol during bolus administration of intravenous contrast. Multiplanar CT image reconstructions and MIPs were obtained to evaluate the vascular anatomy. CONTRAST:  171m OMNIPAQUE IOHEXOL 350 MG/ML SOLN COMPARISON:  Radiograph earlier today. Chest CT 03/12/2018 FINDINGS: Cardiovascular: Previous filling defects within the lower lobes have  resolved. No residual or recurrent pulmonary embolus. Thoracic aorta is normal in caliber. Common origin of the brachiocephalic and left common carotid artery, variant arch anatomy. No aortic dissection. Heart is normal in size. No pericardial effusion. Mediastinum/Nodes: Small epicardial nodes are unchanged from prior exam. No enlarged mediastinal or hilar lymph nodes. No visualized thyroid nodule. No esophageal wall thickening. Lungs/Pleura: No focal airspace disease. No pulmonary edema or pleural effusion. No pulmonary mass or suspicious nodule. Trachea and mainstem bronchi are patent. Subsegmental atelectasis in the lingula. Upper Abdomen: No acute findings. Musculoskeletal: Multilevel degenerative change in the spine. There are no acute or suspicious osseous abnormalities. Review of the MIP images confirms the above findings. IMPRESSION: No acute, residual or recurrent pulmonary embolus. Previous pulmonary emboli have resolved. No acute abnormality in the chest. Electronically Signed   By: MKeith RakeM.D.   On: 04/09/2019 17:48   VAS [place]OWER EXTREMITY VENOUS (DVT) (ONLY [facility] & [facility])  Result Date: 04/09/2019  Lower Venous DVTStudy Indications: Edema.  Limitations: Body habitus and poor ultrasound/tissue interface. Comparison Study: 07/10/18 previous Performing Technologist: MAbram SanderRVS  Examination Guidelines: A complete evaluation includes B-mode imaging, spectral Doppler, color Doppler, and power Doppler as needed of all accessible portions of each vessel. Bilateral testing is considered an integral part of a complete examination. Limited examinations for reoccurring indications may be performed as noted. The reflux portion of the exam is performed with the patient in reverse Trendelenburg.  +---------+---------------+---------+-----------+----------+--------------+ RIGHT    CompressibilityPhasicitySpontaneityPropertiesThrombus Aging  +---------+---------------+---------+-----------+----------+--------------+ CFV      Full           Yes      Yes                                 +---------+---------------+---------+-----------+----------+--------------+ SFJ      Full                                                        +---------+---------------+---------+-----------+----------+--------------+ FV Prox  Full                                                        +---------+---------------+---------+-----------+----------+--------------+ FV Mid   Full                                                        +---------+---------------+---------+-----------+----------+--------------+  FV Distal               Yes      Yes                                 +---------+---------------+---------+-----------+----------+--------------+ PFV      Full                                                        +---------+---------------+---------+-----------+----------+--------------+ POP      Full           Yes      Yes                                 +---------+---------------+---------+-----------+----------+--------------+ PTV      Full                                                        +---------+---------------+---------+-----------+----------+--------------+ PERO                                                  Not visualized +---------+---------------+---------+-----------+----------+--------------+   +----+---------------+---------+-----------+----------+--------------+ LEFTCompressibilityPhasicitySpontaneityPropertiesThrombus Aging +----+---------------+---------+-----------+----------+--------------+ CFV                                              Not visualized +----+---------------+---------+-----------+----------+--------------+     Summary: RIGHT: - There is no evidence of deep vein thrombosis in the lower extremity.  - No cystic structure found in the popliteal fossa.    *See table(s) above for measurements and observations. Electronically signed by Servando Snare MD on 04/09/2019 at 4:42:03 PM.    Final     Procedures Procedures (including critical care time)  Medications Ordered in ED Medications  sodium chloride 0.9 % bolus 500 mL (0 mLs Intravenous Stopped 04/09/19 1600)  iohexol (OMNIPAQUE) 350 MG/ML injection 100 mL (100 mLs Intravenous Contrast Given 04/09/19 1634)    ED Course  I have reviewed the triage vital signs and the nursing notes.  Pertinent labs & imaging results that were available during my care of the patient were reviewed by me and considered in my medical decision making (see chart for details).  54 year old female presents for evaluation of SOB. Afebrile, non septic, non ill appearing. HX PE, DVT. States feels similar. Has not missed doses of Xarelto. Last week had redness, swelling and warmth to her right calf in the popliteal fossa.  States redness, swelling and warmth has resolved however she does still has some tenderness to her right popliteal fossa.  No exertional chest pain.  No radiation. No associated nausea, vomiting, diaphoresis, lightheaded and dizziness. Has been using her asthma inhaler at home, history of seasonal allergies. Her heart and lungs are clear. Her abdomen is soft. She speaks in full sentences without difficulty. She  has no tachycardia, tachypnea or hypoxia.  Plan on labs, imaging, reassess.  HEART score 3- Age, Risk factors. Low suspicion for ACS, dissection, bacterial infectious process.  Labs and imaging personally reviewed and interpreted: Metabolic panel hyperglycemia to 222, given IV fluids, no electrolyte, renal or normality CBC without leukocytosis, hemoglobin INR 2.0 Urinalysis with moderate leuks, few bacteria. No UA complaints DG chest without infiltrates, cardiomegaly, edema, pneumothorax CTA Chest negative for acute pathology. Trop delta negative EKG no STEMI,  Patient reassessed. No current  complaints. Discussed labs and imaging findings. Will dc home with close outpatient follow up. She does not want COVID testing at this time.   Patient is to be discharged with recommendation to follow up with PCP in regards to today's hospital visit. Chest pain is not likely of cardiac or pulmonary etiology d/t presentation, VSS, no tracheal deviation, no JVD or new murmur, RRR, breath sounds equal bilaterally, EKG without acute abnormalities, negative troponin, and negative CXR. Pt has been advised to return to the ED if CP becomes exertional, associated with diaphoresis or nausea, radiates to left jaw/arm, worsens or becomes concerning in any way. Pt appears reliable for follow up and is agreeable to discharge.   The patient has been appropriately medically screened and/or stabilized in the ED. I have low suspicion for any other emergent medical condition which would require further screening, evaluation or treatment in the ED or require inpatient management.  Patient is hemodynamically stable and in no acute distress.  Patient able to ambulate in department prior to ED.  Evaluation does not show acute pathology that would require ongoing or additional emergent interventions while in the emergency department or further inpatient treatment.  I have discussed the diagnosis with the patient and answered all questions.  Pain is been managed while in the emergency department and patient has no further complaints prior to discharge.  Patient is comfortable with plan discussed in room and is stable for discharge at this time.  I have discussed strict return precautions for returning to the emergency department.  Patient was encouraged to follow-up with PCP/specialist refer to at discharge.     MDM Rules/Calculators/A&P                      DEBORAHA GOAR was evaluated in Emergency Department on 04/09/2019 for the symptoms described in the history of present illness. She was evaluated in the context of the  global COVID-19 pandemic, which necessitated consideration that the patient might be at risk for infection with the SARS-CoV-2 virus that causes COVID-19. Institutional protocols and algorithms that pertain to the evaluation of patients at risk for COVID-19 are in a state of rapid change based on information released by regulatory bodies including the CDC and federal and state organizations. These policies and algorithms were followed during the patient's care in the ED. Final Clinical Impression(s) / ED Diagnoses Final diagnoses:  Precordial pain    Rx / DC Orders ED Discharge Orders    None       Dakotah Heiman A, PA-C 04/09/19 1913    Clarion Mooneyhan A, PA-C 04/09/19 1914    Veryl Speak, MD 04/10/19 [phone]

## 2019-04-09 NOTE — Discharge Instructions (Signed)
Follow up with PCP for reevaluation.

## 2019-04-09 NOTE — ED Triage Notes (Signed)
Pt reports right posterior knee pain since last night, SOB for the past week along with back pain and a dry cough. Pt has hx of PE and DVT last year, pt taking xarelto. Resp e/u, nad noted at this time.

## 2019-04-09 NOTE — Progress Notes (Signed)
Lower extremity venous has been completed.   Preliminary results in CV Proc.   Abram Sander 04/09/2019 3:27 PM

## 2019-04-09 NOTE — ED Notes (Signed)
[place] [facility] [place] being at bedside at this time

## 2019-04-10 NOTE — Telephone Encounter (Signed)
Pt states she went to the ER yesterday and would like you to review her chart.

## 2019-04-10 NOTE — Telephone Encounter (Signed)
I agree this sounds like a blood clot. She needs to go to the ER to be examined and to have a Venous doppler asap

## 2019-04-10 NOTE — Telephone Encounter (Signed)
See below

## 2019-04-13 NOTE — Telephone Encounter (Signed)
Set up a virtual ER follow so we can discuss this together

## 2019-04-13 NOTE — Telephone Encounter (Signed)
Spoke with patient, vv scheduled for 04/14/19.

## 2019-04-14 ENCOUNTER — Telehealth (INDEPENDENT_AMBULATORY_CARE_PROVIDER_SITE_OTHER): Payer: Medicare Other | Admitting: Family Medicine

## 2019-04-14 ENCOUNTER — Encounter: Payer: Self-pay | Admitting: Family Medicine

## 2019-04-14 ENCOUNTER — Other Ambulatory Visit: Payer: Self-pay

## 2019-04-14 VITALS — Ht 67.0 in | Wt 390.0 lb

## 2019-04-14 DIAGNOSIS — Z79899 Other long term (current) drug therapy: Secondary | ICD-10-CM | POA: Diagnosis not present

## 2019-04-14 DIAGNOSIS — G2581 Restless legs syndrome: Secondary | ICD-10-CM

## 2019-04-14 DIAGNOSIS — R0602 Shortness of breath: Secondary | ICD-10-CM

## 2019-04-14 DIAGNOSIS — G894 Chronic pain syndrome: Secondary | ICD-10-CM | POA: Diagnosis not present

## 2019-04-14 DIAGNOSIS — M7989 Other specified soft tissue disorders: Secondary | ICD-10-CM | POA: Diagnosis not present

## 2019-04-14 DIAGNOSIS — R0781 Pleurodynia: Secondary | ICD-10-CM | POA: Diagnosis not present

## 2019-04-14 DIAGNOSIS — M5416 Radiculopathy, lumbar region: Secondary | ICD-10-CM | POA: Diagnosis not present

## 2019-04-14 DIAGNOSIS — Z79891 Long term (current) use of opiate analgesic: Secondary | ICD-10-CM | POA: Diagnosis not present

## 2019-04-14 MED ORDER — PRAMIPEXOLE DIHYDROCHLORIDE 0.5 MG PO TABS: 0.5000 mg | ORAL_TABLET | Freq: Every day | ORAL | 2 refills | Status: DC

## 2019-04-14 NOTE — Progress Notes (Signed)
Virtual Visit via Video Note  I connected with the patient on 04/14/19 at 10:45 AM EDT by a video enabled telemedicine application and verified that I am speaking with the correct person using two identifiers.  Location patient: home Location provider:work or home office Persons participating in the virtual visit: patient, provider  I discussed the limitations of evaluation and management by telemedicine and the availability of in person appointments. The patient expressed understanding and agreed to proceed.   HPI: Here for several issues. First we need to follow up on an ER visit on 04-09-19. That day she had developed some mild SOB and a dry cough and pleuritic chest pains. This was after several days of swelling and redness and pain in the right calf. No fever. No NVD. She says her symptoms perfectly mirrored the symptoms she had last year with a DVT in the leg and pulmonary emboli. At the ER all labs were normal, EKG was normal, and CXR was clear. A CT angiogram of the chest was also clear, and a venous doppler of the leg was normal. Basically no explanation for her symptoms was found, and her symptoms quickly resolved over a period of several hours. She has not had anything like this ever since that day. She remains on Xarelto daily and she never misses a dose. The second issue is the desire to move her legs or to clench the muscles in her legs at night in bed. This started about a year ago but it has gotten a little worse. This never happens during the day. She thinks this is restless legs because several of her family members have the same condition.    ROS: See pertinent positives and negatives per HPI.  Past Medical History:  Diagnosis Date  . Anxiety    per pt. - mild   . Arthritis    back & ankles   . Asthma    seasonal   . Cancer [facility])    breast, sees Dr. Jana Hakim , Left - 11/2007  . Complication of anesthesia 2013   severe muscle spasms-sore-no doc  . Depression   . Diabetes  mellitus    sees Dr. Chalmers Cater, diagnosed 2003  . HA (headache)   . Hearing loss    Went to [facility] Throat,Dr [facility]  . Hyperlipidemia   . Kidney infection    - hosp. [facility]- septic- 10/2009  . Low back pain   . Peripheral neuropathy    R sided numbness- face & jaw  . Sleep apnea 9/15   severe-just started cpap-doing well  . TIA (transient ischemic attack)    in 2007,2008, and 2011  . Tinnitus of right ear   . Urosepsis 08-2009   with Klebsiella    Past Surgical History:  Procedure Laterality Date  . ABDOMINAL HYSTERECTOMY  08/2004  . BREAST BIOPSY  06/22/2011   Procedure: BREAST BIOPSY;  Surgeon: Stark Klein, MD;  Location: [place];  Service: General;  Laterality: Right;  . BREAST SURGERY  11/2007   left ductal carcinoma in situ  . CARPAL TUNNEL RELEASE Right 10/13/2013   Procedure: RIGHT CARPAL TUNNEL RELEASE;  Surgeon: Hessie Dibble, MD;  Location: [place];  Service: Orthopedics;  Laterality: Right;  . CARPAL TUNNEL RELEASE Left 11/24/2013   Procedure: LEFT CARPAL TUNNEL RELEASE WITH CYST EXCISION ;  Surgeon: Hessie Dibble, MD;  Location: [place];  Service: Orthopedics;  Laterality: Left;  . EAR CYST EXCISION Left 11/24/2013  Procedure: CYST REMOVAL;  Surgeon: Hessie Dibble, MD;  Location: [place];  Service: Orthopedics;  Laterality: Left;  . FOOT SURGERY  2000   plantar fasciitis-left  . LUMBAR DISC SURGERY  03-29-14   Fusion, revision 04-27-14 L4-5 [place] with graft  . NERVE GRAFT  06-01-08   cadaver graft to right inferior alveolar nerve  [place]    Family History  Problem Relation Age of Onset  . Cancer Mother        breast  . Cancer Father        lung  . Diabetes Father   . Cancer Sister        colon  . Cancer Maternal Aunt        breast - both  . Diabetes Sister   . Anesthesia problems Neg Hx      Current Outpatient Medications:  .  albuterol (PROVENTIL) (2.5 MG/3ML)  0.083% nebulizer solution, 1 VIAL IN NEBULIZER EVERY 4 HOURS AS NEEDED FOR WHEEZING (Patient taking differently: Take 2.5 mg by nebulization every 4 (four) hours as needed for wheezing. ), Disp: 75 mL, Rfl: 1 .  albuterol (VENTOLIN HFA) 108 (90 Base) MCG/ACT inhaler, Inhale 2 puffs into the lungs every 4 (four) hours as needed for wheezing or shortness of breath., Disp: 18 g, Rfl: 5 .  ALPRAZolam (XANAX) 1 MG tablet, TAKE 1 TABLET BY MOUTH THREE TIMES A DAY, Disp: 90 tablet, Rfl: 5 .  atorvastatin (LIPITOR) 10 MG tablet, Take 1 tablet (10 mg total) by mouth at bedtime., Disp: 90 tablet, Rfl: 3 .  Blood Glucose Monitoring Suppl (CONTOUR NEXT MONITOR) w/Device KIT, 1 each by Does not apply route 2 (two) times daily. To check blood sugars twice a day., Disp: 1 kit, Rfl: 0 .  Buprenorphine 15 MCG/HR PTWK, Place 1 patch onto the skin every Saturday. , Disp: , Rfl: 0 .  ciprofloxacin (CIPRO) 500 MG tablet, Take 1 tablet (500 mg total) by mouth 2 (two) times daily., Disp: 20 tablet, Rfl: 0 .  furosemide (LASIX) 20 MG tablet, Take 1 tablet (20 mg total) by mouth daily. Take extra 61m as needed if swelling worse., Disp: 30 tablet, Rfl: 4 .  gabapentin (NEURONTIN) 300 MG capsule, Take 300 mg by mouth 2 (two) times daily. , Disp: , Rfl:  .  glucose blood (CONTOUR NEXT TEST) test strip, Use to test blood sugar 2 times daily, Disp: 100 each, Rfl: 12 .  glucose blood test strip, Use as instructed, Disp: 100 each, Rfl: 0 .  insulin lispro protamine-lispro (HUMALOG 75/25 MIX) (75-25) 100 UNIT/ML SUSP injection, Inject 80 Units into the skin 2 (two) times daily with a meal. Inject 80 unit under the skin twice daily, Disp: 50 mL, Rfl: 3 .  Insulin Syringe-Needle U-100 (INSULIN SYRINGE 1CC/31GX5/16") 31G X 5/16" 1 ML MISC, 2x daily, Disp: 100 each, Rfl: 11 .  levothyroxine (SYNTHROID) 75 MCG tablet, Take 1 tablet (75 mcg total) by mouth daily., Disp: 90 tablet, Rfl: 3 .  metFORMIN (GLUCOPHAGE) 1000 MG tablet, Take 1  tablet (1,000 mg total) by mouth daily with breakfast., Disp: 90 tablet, Rfl: 3 .  methocarbamol (ROBAXIN) 750 MG tablet, Take 1 tablet (750 mg total) by mouth 2 (two) times daily. (Patient taking differently: Take 750 mg by mouth 3 (three) times daily. ), Disp: 60 tablet, Rfl: 5 .  Multiple Vitamin (MULTIVITAMIN WITH MINERALS) TABS tablet, Take 1 tablet by mouth daily., Disp: , Rfl:  .  multivitamin (  VIT W/EXTRA C) CHEW chewable tablet, Chew 1 tablet by mouth daily. , Disp: , Rfl:  .  nitrofurantoin, macrocrystal-monohydrate, (MACROBID) 100 MG capsule, Take 1 capsule (100 mg total) by mouth 2 (two) times daily., Disp: 14 capsule, Rfl: 0 .  rivaroxaban (XARELTO) 20 MG TABS tablet, Take 1 tablet (20 mg total) by mouth daily., Disp: 30 tablet, Rfl: 11 .  senna-docusate (SENOKOT-S) 8.6-50 MG tablet, Take 1 tablet by mouth 2 (two) times daily., Disp:  , Rfl:  .  sertraline (ZOLOFT) 100 MG tablet, TAKE 2 TABLETS (200 MG TOTAL) BY MOUTH AT BEDTIME. (Patient taking differently: Take 200 mg by mouth daily. ), Disp: 180 tablet, Rfl: 3 .  pramipexole (MIRAPEX) 0.5 MG tablet, Take 1 tablet (0.5 mg total) by mouth at bedtime., Disp: 30 tablet, Rfl: 2  EXAM:  VITALS per patient if applicable:  GENERAL: alert, oriented, appears well and in no acute distress  HEENT: atraumatic, conjunttiva clear, no obvious abnormalities on inspection of external nose and ears  NECK: normal movements of the head and neck  LUNGS: on inspection no signs of respiratory distress, breathing rate appears normal, no obvious gross SOB, gasping or wheezing  CV: no obvious cyanosis  MS: moves all visible extremities without noticeable abnormality  PSYCH/NEURO: pleasant and cooperative, no obvious depression or anxiety, speech and thought processing grossly intact  ASSESSMENT AND PLAN: She had an episode of swelling and pain in the right leg, along with SOB and pleuritic chest pain a week ago. These symptoms all resolved within  24 hours. I think the best explanation was she did indeed have a DVT in the leg and she developed pulmonary emboli from this, but the clots resolved as quickly as they came from the action of the Xarelto. In any event she has felt well since then. She will let us know if this happens again. The second issue is restless leg syndrome, which she definitely has. We will treat this with Mirapex 0.5 mg at bedtime. Recheck as needed.  [name], MD  Discussed the following assessment and plan:  No diagnosis found.     I discussed the assessment and treatment plan with the patient. The patient was provided an opportunity to ask questions and all were answered. The patient agreed with the plan and demonstrated an understanding of the instructions.   The patient was advised to call back or seek an in-person evaluation if the symptoms worsen or if the condition fails to improve as anticipated.

## 2019-04-25 ENCOUNTER — Other Ambulatory Visit: Payer: Self-pay | Admitting: Internal Medicine

## 2019-05-13 ENCOUNTER — Encounter: Payer: Self-pay | Admitting: Family Medicine

## 2019-05-18 NOTE — Telephone Encounter (Signed)
I ordered the MRI scans. I also ordered a BMET since the scans will need contrast. He needs to schedule a lab time for this

## 2019-05-19 NOTE — Telephone Encounter (Signed)
As noted in the chart, I have already ordered these scans

## 2019-05-25 ENCOUNTER — Other Ambulatory Visit: Payer: Self-pay | Admitting: Internal Medicine

## 2019-06-17 ENCOUNTER — Encounter: Payer: Self-pay | Admitting: Family Medicine

## 2019-06-17 DIAGNOSIS — R350 Frequency of micturition: Secondary | ICD-10-CM

## 2019-06-19 ENCOUNTER — Ambulatory Visit (INDEPENDENT_AMBULATORY_CARE_PROVIDER_SITE_OTHER): Payer: Medicare Other | Admitting: Family Medicine

## 2019-06-19 ENCOUNTER — Encounter: Payer: Self-pay | Admitting: Family Medicine

## 2019-06-19 ENCOUNTER — Encounter: Payer: Self-pay | Admitting: Internal Medicine

## 2019-06-19 ENCOUNTER — Other Ambulatory Visit: Payer: Self-pay

## 2019-06-19 VITALS — BP 118/82 | HR 83 | Temp 97.8°F | Wt >= 6400 oz

## 2019-06-19 DIAGNOSIS — E1165 Type 2 diabetes mellitus with hyperglycemia: Secondary | ICD-10-CM

## 2019-06-19 DIAGNOSIS — Z794 Long term (current) use of insulin: Secondary | ICD-10-CM | POA: Diagnosis not present

## 2019-06-19 DIAGNOSIS — R350 Frequency of micturition: Secondary | ICD-10-CM

## 2019-06-19 LAB — POC URINALSYSI DIPSTICK (AUTOMATED)
Bilirubin, UA: NEGATIVE
Blood, UA: NEGATIVE
Glucose, UA: POSITIVE — AB
Ketones, UA: NEGATIVE
Leukocytes, UA: NEGATIVE
Nitrite, UA: POSITIVE
Protein, UA: NEGATIVE
Spec Grav, UA: 1.02 (ref 1.010–1.025)
Urobilinogen, UA: 0.2 E.U./dL
pH, UA: 6 (ref 5.0–8.0)

## 2019-06-19 LAB — POCT GLYCOSYLATED HEMOGLOBIN (HGB A1C): Hemoglobin A1C: 10.1 % — AB (ref 4.0–5.6)

## 2019-06-19 NOTE — Progress Notes (Signed)
Subjective:    Patient ID: Cheyenne Guzman, female    DOB: October 15, 1965, 54 y.o.   MRN: [id number]  No chief complaint on file.   HPI Pt is a 54 yo female with pmh sig for OSA, asthma, h/o PE, DM 2, hypothyroidism, lymphedema, obesity, anxiety, depression, HLD, RLS who was seen for acute concern.  Pt endorses urinary frequency, odor, low-grade fever.  Temp 99.5, and questionable back pain since Wednesday.  Pt endorses chronic back pain so unsure if it is her normal pain.  Pt took a leftover Cipro pill for her symptoms.  Denies vaginal irritation, discharge, dysuria.  FSBS at home 275 and 300 the last time patient checked.  Pt states she does not check it daily.  Patient taking Humalog 75/25 90 units twice daily and Metformin 1000 mg.  Followed by Dr. Hiram Guzman.  Next appointment in August.  Pt notes recent increased stress as her sister was sick in the hospital.  Also just returned from vacation in [place].  Past Medical History:  Diagnosis Date  . Anxiety    per pt. - mild   . Arthritis    back & ankles   . Asthma    seasonal   . Cancer [facility])    breast, sees Dr. Jana Guzman , Left - 11/2007  . Complication of anesthesia 2013   severe muscle spasms-sore-no doc  . Depression   . Diabetes mellitus    sees Dr. Chalmers Guzman, diagnosed 2003  . HA (headache)   . Hearing loss    Went to [facility] Throat,Dr Cheyenne Guzman  . Hyperlipidemia   . Kidney infection    - hosp. [facility]- septic- 10/2009  . Low back pain   . Peripheral neuropathy    R sided numbness- face & jaw  . Sleep apnea 9/15   severe-just started cpap-doing well  . TIA (transient ischemic attack)    in 2007,2008, and 2011  . Tinnitus of right ear   . Urosepsis 08-2009   with Klebsiella    Allergies  Allergen Reactions  . Vicodin [Hydrocodone-Acetaminophen] Nausea And Vomiting    ROS General: Denies fever, chills, night sweats, changes in weight, changes in appetite HEENT: Denies headaches, ear pain, changes in  vision, rhinorrhea, sore throat CV: Denies CP, palpitations, SOB, orthopnea Pulm: Denies SOB, cough, wheezing GI: Denies abdominal pain, nausea, vomiting, diarrhea, constipation GU: Denies dysuria, hematuria, frequency, vaginal discharge +urgency, odor, frequency Msk: Denies muscle cramps, joint pains Neuro: Denies weakness, numbness, tingling Skin: Denies rashes, bruising Psych: Denies depression, anxiety, hallucinations     Objective:    Blood pressure 118/82, pulse 83, temperature 97.8 F (36.6 C), temperature source Temporal, weight (!) 403 lb (182.8 kg), SpO2 97 %.  Gen. Pleasant, well-nourished, in no distress, normal affect   HEENT: [place]/AT, face symmetric, no scleral icterus, PERRLA, EOMI, nares patent without drainage Lungs: no accessory muscle use, CTAB, no wheezes or rales Cardiovascular: RRR, no m/r/g, no peripheral edema Musculoskeletal: No CVA tenderness.  No deformities, no cyanosis or clubbing, normal tone Neuro:  A&Ox3, CN II-XII intact, normal gait  Wt Readings from Last 3 Encounters:  04/14/19 (!) 390 lb (176.9 kg)  04/09/19 (!) 390 lb (176.9 kg)  11/05/18 (!) 397 lb 9.6 oz (180.4 kg)    Lab Results  Component Value Date   WBC 6.3 04/09/2019   HGB 13.9 04/09/2019   HCT 46.0 04/09/2019   PLT 167 04/09/2019   GLUCOSE 222 (H) 04/09/2019   CHOL 219 (H) 11/05/2018  TRIG 250.0 (H) 11/05/2018   HDL 40.10 11/05/2018   LDLDIRECT 151.0 11/05/2018   LDLCALC 78 11/28/2012   ALT 15 11/11/2018   AST 16 11/11/2018   NA 137 04/09/2019   K 4.4 04/09/2019   CL 101 04/09/2019   CREATININE 0.92 04/09/2019   BUN 14 04/09/2019   CO2 24 04/09/2019   TSH 2.57 11/05/2018   INR 2.0 (H) 04/09/2019   HGBA1C 8.7 (A) 11/05/2018   MICROALBUR 15.7 (H) 11/05/2018    Assessment/Plan:  Urinary frequency  -UA with 3+ glucose - Plan: POCT Urinalysis Dipstick (Automated)  Type 2 diabetes mellitus with hyperglycemia, with long-term current use of insulin (HCC)  -UA with 3+  glucose -Hemoglobin A1c this visit 10.1% -pt advised to call Endocrinology for sooner f/u. -lifestyle modifications encouraged - Plan: POCT glycosylated hemoglobin (Hb A1C)  F/u as needed  Cheyenne Mitts, MD

## 2019-06-19 NOTE — Patient Instructions (Signed)
Hyperglycemia Hyperglycemia occurs when the level of sugar (glucose) in the blood is too high. Glucose is a type of sugar that provides the body's main source of energy. Certain hormones (insulin and glucagon) control the level of glucose in the blood. Insulin lowers blood glucose, and glucagon increases blood glucose. Hyperglycemia can result from having too little insulin in the bloodstream, or from the body not responding normally to insulin. Hyperglycemia occurs most often in people who have diabetes (diabetes mellitus), but it can happen in people who do not have diabetes. It can develop quickly, and it can be life-threatening if it causes you to become severely dehydrated (diabetic ketoacidosis or hyperglycemic hyperosmolar state). Severe hyperglycemia is a medical emergency. What are the causes? If you have diabetes, hyperglycemia may be caused by:  Diabetes medicine.  Medicines that increase blood glucose or affect your diabetes control.  Not eating enough, or not eating often enough.  Changes in physical activity level.  Being sick or having an infection. If you have prediabetes or undiagnosed diabetes:  Hyperglycemia may be caused by those conditions. If you do not have diabetes, hyperglycemia may be caused by:  Certain medicines, including steroid medicines, beta-blockers, epinephrine, and thiazide diuretics.  Stress.  Serious illness.  Surgery.  Diseases of the pancreas.  Infection. What increases the risk? Hyperglycemia is more likely to develop in people who have risk factors for diabetes, such as:  Having a family member with diabetes.  Having a gene for type 1 diabetes that is passed from parent to child (inherited).  Living in an area with cold weather conditions.  Exposure to certain viruses.  Certain conditions in which the body's disease-fighting (immune) system attacks itself (autoimmune disorders).  Being overweight or obese.  Having an inactive  (sedentary) lifestyle.  Having been diagnosed with insulin resistance.  Having a history of prediabetes, gestational diabetes, or polycystic ovarian syndrome (PCOS).  Being of American-Indian, African-American, Hispanic/Latino, or Asian/Pacific Islander descent. What are the signs or symptoms? Hyperglycemia may not cause any symptoms. If you do have symptoms, they may include early warning signs, such as:  Increased thirst.  Hunger.  Feeling very tired.  Needing to urinate more often than usual.  Blurry vision. Other symptoms may develop if hyperglycemia gets worse, such as:  Dry mouth.  Loss of appetite.  Fruity-smelling breath.  Weakness.  Unexpected or rapid weight gain or weight loss.  Tingling or numbness in the hands or feet.  Headache.  Skin that does not quickly return to normal after being lightly pinched and released (poor skin turgor).  Abdominal pain.  Cuts or bruises that are slow to heal. How is this diagnosed? Hyperglycemia is diagnosed with a blood test to measure your blood glucose level. This blood test is usually done while you are having symptoms. Your health care provider may also do a physical exam and review your medical history. You may have more tests to determine the cause of your hyperglycemia, such as:  A fasting blood glucose (FBG) test. You will not be allowed to eat (you will fast) for at least 8 hours before a blood sample is taken.  An A1c (hemoglobin A1c) blood test. This provides information about blood glucose control over the previous 2-3 months.  An oral glucose tolerance test (OGTT). This measures your blood glucose at two times: ? After fasting. This is your baseline blood glucose level. ? Two hours after drinking a beverage that contains glucose. How is this treated? Treatment depends on the cause   of your hyperglycemia. Treatment may include:  Taking medicine to regulate your blood glucose levels. If you take insulin or  other diabetes medicines, your medicine or dosage may be adjusted.  Lifestyle changes, such as exercising more, eating healthier foods, or losing weight.  Treating an illness or infection, if this caused your hyperglycemia.  Checking your blood glucose more often.  Stopping or reducing steroid medicines, if these caused your hyperglycemia. If your hyperglycemia becomes severe and it results in hyperglycemic hyperosmolar state, you must be hospitalized and given IV fluids. Follow these instructions at home:  General instructions  Take over-the-counter and prescription medicines only as told by your health care provider.  Do not use any products that contain nicotine or tobacco, such as cigarettes and e-cigarettes. If you need help quitting, ask your health care provider.  Limit alcohol intake to no more than 1 drink per day for nonpregnant women and 2 drinks per day for men. One drink equals 12 oz of beer, 5 oz of wine, or 1 oz of hard liquor.  Learn to manage stress. If you need help with this, ask your health care provider.  Keep all follow-up visits as told by your health care provider. This is important. Eating and drinking   Maintain a healthy weight.  Exercise regularly, as directed by your health care provider.  Stay hydrated, especially when you exercise, get sick, or spend time in hot temperatures.  Eat healthy foods, such as: ? Lean proteins. ? Complex carbohydrates. ? Fresh fruits and vegetables. ? Low-fat dairy products. ? Healthy fats.  Drink enough fluid to keep your urine clear or pale yellow. If you have diabetes:  Make sure you know the symptoms of hyperglycemia.  Follow your diabetes management plan, as told by your health care provider. Make sure you: ? Take your insulin and medicines as directed. ? Follow your exercise plan. ? Follow your meal plan. Eat on time, and do not skip meals. ? Check your blood glucose as often as directed. Make sure to  check your blood glucose before and after exercise. If you exercise longer or in a different way than usual, check your blood glucose more often. ? Follow your sick day plan whenever you cannot eat or drink normally. Make this plan in advance with your health care provider.  Share your diabetes management plan with people in your workplace, school, and household.  Check your urine for ketones when you are ill and as told by your health care provider.  Carry a medical alert card or wear medical alert jewelry. Contact a health care provider if:  Your blood glucose is at or above 240 mg/dL (13.3 mmol/L) for 2 days in a row.  You have problems keeping your blood glucose in your target range.  You have frequent episodes of hyperglycemia. Get help right away if:  You have difficulty breathing.  You have a change in how you think, feel, or act (mental status).  You have nausea or vomiting that does not go away. These symptoms may represent a serious problem that is an emergency. Do not wait to see if the symptoms will go away. Get medical help right away. Call your local emergency services (911 in the [place] Do not drive yourself to the hospital. Summary  Hyperglycemia occurs when the level of sugar (glucose) in the blood is too high.  Hyperglycemia is diagnosed with a blood test to measure your blood glucose level. This blood test is usually done while you are   having symptoms. Your health care provider may also do a physical exam and review your medical history.  If you have diabetes, follow your diabetes management plan as told by your health care provider.  Contact your health care provider if you have problems keeping your blood glucose in your target range. This information is not intended to replace advice given to you by your health care provider. Make sure you discuss any questions you have with your health care provider. Document Revised: 09/26/2015 Document Reviewed:  09/26/2015 Elsevier Patient Education  [place].  Diabetes Basics  Diabetes (diabetes mellitus) is a long-term (chronic) disease. It occurs when the body does not properly use sugar (glucose) that is released from food after you eat. Diabetes may be caused by one or both of these problems:  Your pancreas does not make enough of a hormone called insulin.  Your body does not react in a normal way to insulin that it makes. Insulin lets sugars (glucose) go into cells in your body. This gives you energy. If you have diabetes, sugars cannot get into cells. This causes high blood sugar (hyperglycemia). Follow these instructions at home: How is diabetes treated? You may need to take insulin or other diabetes medicines daily to keep your blood sugar in balance. Take your diabetes medicines every day as told by your doctor. List your diabetes medicines here: Diabetes medicines  Name of medicine: ______________________________ ? Amount (dose): _______________ Time (a.m./p.m.): _______________ Notes: ___________________________________  Name of medicine: ______________________________ ? Amount (dose): _______________ Time (a.m./p.m.): _______________ Notes: ___________________________________  Name of medicine: ______________________________ ? Amount (dose): _______________ Time (a.m./p.m.): _______________ Notes: ___________________________________ If you use insulin, you will learn how to give yourself insulin by injection. You may need to adjust the amount based on the food that you eat. List the types of insulin you use here: Insulin  Insulin type: ______________________________ ? Amount (dose): _______________ Time (a.m./p.m.): _______________ Notes: ___________________________________  Insulin type: ______________________________ ? Amount (dose): _______________ Time (a.m./p.m.): _______________ Notes: ___________________________________  Insulin type:  ______________________________ ? Amount (dose): _______________ Time (a.m./p.m.): _______________ Notes: ___________________________________  Insulin type: ______________________________ ? Amount (dose): _______________ Time (a.m./p.m.): _______________ Notes: ___________________________________  Insulin type: ______________________________ ? Amount (dose): _______________ Time (a.m./p.m.): _______________ Notes: ___________________________________ How do I manage my blood sugar?  Check your blood sugar levels using a blood glucose monitor as directed by your doctor. Your doctor will set treatment goals for you. Generally, you should have these blood sugar levels:  Before meals (preprandial): 80-130 mg/dL (4.4-7.2 mmol/L).  After meals (postprandial): below 180 mg/dL (10 mmol/L).  A1c level: less than 7%. Write down the times that you will check your blood sugar levels: Blood sugar checks  Time: _______________ Notes: ___________________________________  Time: _______________ Notes: ___________________________________  Time: _______________ Notes: ___________________________________  Time: _______________ Notes: ___________________________________  Time: _______________ Notes: ___________________________________  Time: _______________ Notes: ___________________________________  What do I need to know about low blood sugar? Low blood sugar is called hypoglycemia. This is when blood sugar is at or below 70 mg/dL (3.9 mmol/L). Symptoms may include:  Feeling: ? Hungry. ? Worried or nervous (anxious). ? Sweaty and clammy. ? Confused. ? Dizzy. ? Sleepy. ? Sick to your stomach (nauseous).  Having: ? A fast heartbeat. ? A headache. ? A change in your vision. ? Tingling or no feeling (numbness) around the mouth, lips, or tongue. ? Jerky movements that you cannot control (seizure).  Having trouble with: ? Moving (coordination). ? Sleeping. ? Passing out  (fainting). ? Getting upset  easily (irritability). Treating low blood sugar To treat low blood sugar, eat or drink something sugary right away. If you can think clearly and swallow safely, follow the 15:15 rule:  Take 15 grams of a fast-acting carb (carbohydrate). Talk with your doctor about how much you should take.  Some fast-acting carbs are: ? Sugar tablets (glucose pills). Take 3-4 glucose pills. ? 6-8 pieces of hard candy. ? 4-6 oz (120-150 mL) of fruit juice. ? 4-6 oz (120-150 mL) of regular (not diet) soda. ? 1 Tbsp (15 mL) honey or sugar.  Check your blood sugar 15 minutes after you take the carb.  If your blood sugar is still at or below 70 mg/dL (3.9 mmol/L), take 15 grams of a carb again.  If your blood sugar does not go above 70 mg/dL (3.9 mmol/L) after 3 tries, get help right away.  After your blood sugar goes back to normal, eat a meal or a snack within 1 hour. Treating very low blood sugar If your blood sugar is at or below 54 mg/dL (3 mmol/L), you have very low blood sugar (severe hypoglycemia). This is an emergency. Do not wait to see if the symptoms will go away. Get medical help right away. Call your local emergency services (911 in the [place] Do not drive yourself to the hospital. Questions to ask your health care provider  Do I need to meet with a diabetes educator?  What equipment will I need to care for myself at home?  What diabetes medicines do I need? When should I take them?  How often do I need to check my blood sugar?  What number can I call if I have questions?  When is my next doctor's visit?  Where can I find a support group for people with diabetes? Where to find more information  American Diabetes Association: www.diabetes.org  American Association of Diabetes Educators: www.diabeteseducator.org/patient-resources Contact a doctor if:  Your blood sugar is at or above 240 mg/dL (13.3 mmol/L) for 2 days in a row.  You have been sick or  have had a fever for 2 days or more, and you are not getting better.  You have any of these problems for more than 6 hours: ? You cannot eat or drink. ? You feel sick to your stomach (nauseous). ? You throw up (vomit). ? You have watery poop (diarrhea). Get help right away if:  Your blood sugar is lower than 54 mg/dL (3 mmol/L).  You get confused.  You have trouble: ? Thinking clearly. ? Breathing. Summary  Diabetes (diabetes mellitus) is a long-term (chronic) disease. It occurs when the body does not properly use sugar (glucose) that is released from food after digestion.  Take insulin and diabetes medicines as told.  Check your blood sugar every day, as often as told.  Keep all follow-up visits as told by your doctor. This is important. This information is not intended to replace advice given to you by your health care provider. Make sure you discuss any questions you have with your health care provider. Document Revised: 10/01/2018 Document Reviewed: 04/12/2017 Elsevier Patient Education  [place].

## 2019-06-19 NOTE — Telephone Encounter (Signed)
Please advise. does the patient need an appointment or can we have her drop off a urine sample.

## 2019-07-03 ENCOUNTER — Encounter: Payer: Self-pay | Admitting: Internal Medicine

## 2019-07-03 ENCOUNTER — Other Ambulatory Visit: Payer: Self-pay

## 2019-07-03 MED ORDER — HUMALOG MIX 75/25 (75-25) 100 UNIT/ML ~~LOC~~ SUSP: SUBCUTANEOUS | 0 refills | Status: DC

## 2019-07-06 ENCOUNTER — Other Ambulatory Visit: Payer: Self-pay | Admitting: Family Medicine

## 2019-07-09 DIAGNOSIS — Z79899 Other long term (current) drug therapy: Secondary | ICD-10-CM | POA: Diagnosis not present

## 2019-07-09 DIAGNOSIS — G894 Chronic pain syndrome: Secondary | ICD-10-CM | POA: Diagnosis not present

## 2019-07-09 DIAGNOSIS — Z794 Long term (current) use of insulin: Secondary | ICD-10-CM | POA: Diagnosis not present

## 2019-07-09 DIAGNOSIS — E1142 Type 2 diabetes mellitus with diabetic polyneuropathy: Secondary | ICD-10-CM | POA: Diagnosis not present

## 2019-07-09 DIAGNOSIS — M961 Postlaminectomy syndrome, not elsewhere classified: Secondary | ICD-10-CM | POA: Diagnosis not present

## 2019-07-20 ENCOUNTER — Ambulatory Visit (INDEPENDENT_AMBULATORY_CARE_PROVIDER_SITE_OTHER): Payer: Medicare Other | Admitting: Internal Medicine

## 2019-07-20 ENCOUNTER — Other Ambulatory Visit: Payer: Self-pay

## 2019-07-20 ENCOUNTER — Encounter: Payer: Self-pay | Admitting: Internal Medicine

## 2019-07-20 ENCOUNTER — Other Ambulatory Visit: Payer: Self-pay | Admitting: Internal Medicine

## 2019-07-20 VITALS — BP 138/78 | HR 78 | Ht 67.0 in | Wt >= 6400 oz

## 2019-07-20 DIAGNOSIS — E785 Hyperlipidemia, unspecified: Secondary | ICD-10-CM

## 2019-07-20 DIAGNOSIS — E1142 Type 2 diabetes mellitus with diabetic polyneuropathy: Secondary | ICD-10-CM | POA: Diagnosis not present

## 2019-07-20 DIAGNOSIS — E1165 Type 2 diabetes mellitus with hyperglycemia: Secondary | ICD-10-CM | POA: Diagnosis not present

## 2019-07-20 DIAGNOSIS — Z794 Long term (current) use of insulin: Secondary | ICD-10-CM

## 2019-07-20 LAB — GLUCOSE, POCT (MANUAL RESULT ENTRY): POC Glucose: 213 mg/dl — AB (ref 70–99)

## 2019-07-20 MED ORDER — HUMALOG MIX 75/25 (75-25) 100 UNIT/ML ~~LOC~~ SUSP: SUBCUTANEOUS | 2 refills | Status: DC

## 2019-07-20 MED ORDER — METFORMIN HCL 1000 MG PO TABS: 1000.0000 mg | ORAL_TABLET | Freq: Every day | ORAL | 3 refills | Status: DC

## 2019-07-20 MED ORDER — OZEMPIC (0.25 OR 0.5 MG/DOSE) 2 MG/1.5ML ~~LOC~~ SOPN: 0.5000 mg | PEN_INJECTOR | SUBCUTANEOUS | 6 refills | Status: DC

## 2019-07-20 MED ORDER — "INSULIN SYRINGE 31G X 5/16"" 1 ML MISC": 6 refills | Status: DC

## 2019-07-20 MED ORDER — HUMALOG MIX 75/25 (75-25) 100 UNIT/ML ~~LOC~~ SUSP: SUBCUTANEOUS | 6 refills | Status: DC

## 2019-07-20 NOTE — Progress Notes (Signed)
Name: Cheyenne Guzman  Age/ Sex: 54 y.o., female   MRN/ DOB: [id number], [birth year]     PCP: Laurey Morale, MD   Reason for Endocrinology Evaluation: Type 2 Diabetes Mellitus  Initial Endocrine Consultative Visit: 08/06/2016    PATIENT IDENTIFIER: [name] is a 54 y.o. female with a past medical history of T2DM, HTN, OSA. The patient has followed with Endocrinology clinic since 08/06/2016 for consultative assistance with management of her diabetes.  DIABETIC HISTORY:  Ms. Sheets was diagnosed with T2DM in 2000. Has been on Metformin without side effects  .Has been on insulin since 2009. Her hemoglobin A1c has ranged from 8.2% in 2017, peaking at 10.3% in 2019.  Re-established with me 08/2018. Stopped lantus and started Novolog mix as well as metformin.   SUBJECTIVE:   Today (07/20/2019): [name] is here for a follow up on diabetes management . She has not been to our clinic in 8 months.  She is accompanied by her daughter [name]. She checks her blood sugars occasionally. She did not bring meter today. She has been in the donut hole since 03/2019 but able to obtain her medications. The patient has not had hypoglycemic episodes since the last clinic.   HOME REGIMEN:  Huamlog Mix 80 units BID - taking 90 units BID Metformin 500 mg, with breakfast  Lipitor 10 mg daily    Statin: yes ACE-I/ARB: no Prior Diabetic Education: Yes   METER DOWNLOAD SUMMARY: did not bring     DIABETIC COMPLICATIONS: Microvascular complications:   Neuropathy, ? Mild retinopathy   Denies: CKD  Last Eye Exam: Completed 2019  Macrovascular complications:   TIA  Denies: CAD, CVA, PVD   HISTORY:  Past Medical History:  Past Medical History:  Diagnosis Date   Anxiety    per pt. - mild    Arthritis    back & ankles    Asthma    seasonal    Cancer [facility])    breast, sees Dr. Jana Hakim , Left - [id number]   Complication of anesthesia 2013   severe muscle spasms-sore-no doc    Depression    Diabetes mellitus    sees Dr. Chalmers Cater, diagnosed 2003   HA (headache)    Hearing loss    Went to [facility],Dr Laing,both ears   Hyperlipidemia    Kidney infection    - hosp. [facility]- septic- 10/2009   Low back pain    Peripheral neuropathy    R sided numbness- face & jaw   Sleep apnea 9/15   severe-just started cpap-doing well   TIA (transient ischemic attack)    in 2007,2008, and 2011   Tinnitus of right ear    Urosepsis 08-2009   with Klebsiella   Past Surgical History:  Past Surgical History:  Procedure Laterality Date   ABDOMINAL HYSTERECTOMY  08/2004   BREAST BIOPSY  06/22/2011   Procedure: BREAST BIOPSY;  Surgeon: Stark Klein, MD;  Location: [place];  Service: General;  Laterality: Right;   BREAST SURGERY  11/2007   left ductal carcinoma in situ   CARPAL TUNNEL RELEASE Right 10/13/2013   Procedure: RIGHT CARPAL TUNNEL RELEASE;  Surgeon: Hessie Dibble, MD;  Location: [place];  Service: Orthopedics;  Laterality: Right;   CARPAL TUNNEL RELEASE Left 11/24/2013   Procedure: LEFT CARPAL TUNNEL RELEASE WITH CYST EXCISION ;  Surgeon: Hessie Dibble, MD;  Location: [place];  Service: Orthopedics;  Laterality: Left;  EAR CYST EXCISION Left 11/24/2013   Procedure: CYST REMOVAL;  Surgeon: Hessie Dibble, MD;  Location: [place];  Service: Orthopedics;  Laterality: Left;   FOOT SURGERY  2000   plantar fasciitis-left   LUMBAR DISC SURGERY  03-29-14   Fusion, revision 04-27-14 L4-5 Cauda Equina with graft   NERVE GRAFT  06-01-08   cadaver graft to right inferior alveolar nerve  [place]    Social History:  reports that she quit smoking about 27 years ago. She has never used smokeless tobacco. She reports current alcohol use. She reports that she does not use drugs. Family History:  Family History  Problem Relation Age of Onset   Cancer Mother        breast   Cancer Father         lung   Diabetes Father    Cancer Sister        colon   Cancer Maternal Aunt        breast - both   Diabetes Sister    Anesthesia problems Neg Hx      HOME MEDICATIONS: Allergies as of 07/20/2019      Reactions   Vicodin [hydrocodone-acetaminophen] Nausea And Vomiting      Medication List       Accurate as of July 20, 2019  9:34 AM. If you have any questions, ask your nurse or doctor.        albuterol (2.5 MG/3ML) 0.083% nebulizer solution Commonly known as: PROVENTIL 1 VIAL IN NEBULIZER EVERY 4 HOURS AS NEEDED FOR WHEEZING What changed: See the new instructions.   albuterol 108 (90 Base) MCG/ACT inhaler Commonly known as: VENTOLIN HFA Inhale 2 puffs into the lungs every 4 (four) hours as needed for wheezing or shortness of breath. What changed: Another medication with the same name was changed. Make sure you understand how and when to take each.   ALPRAZolam 1 MG tablet Commonly known as: XANAX TAKE 1 TABLET BY MOUTH THREE TIMES A DAY   atorvastatin 10 MG tablet Commonly known as: LIPITOR Take 1 tablet (10 mg total) by mouth at bedtime.   buprenorphine 15 MCG/HR Commonly known as: BUTRANS Place 1 patch onto the skin every Saturday.   ciprofloxacin 500 MG tablet Commonly known as: Cipro Take 1 tablet (500 mg total) by mouth 2 (two) times daily.   Contour Next Monitor w/Device Kit 1 each by Does not apply route 2 (two) times daily. To check blood sugars twice a day.   furosemide 20 MG tablet Commonly known as: LASIX Take 1 tablet (20 mg total) by mouth daily. Take extra 36m as needed if swelling worse.   gabapentin 300 MG capsule Commonly known as: NEURONTIN Take 300 mg by mouth 2 (two) times daily.   glucose blood test strip Use as instructed   glucose blood test strip Commonly known as: Contour Next Test Use to test blood sugar 2 times daily   HumaLOG Mix 75/25 (75-25) 100 UNIT/ML Susp injection Generic drug: insulin lispro  protamine-lispro INJECT 80 UNITS INTO THE SKIN 2 TIMES DAILY WITH A MEAL What changed: how much to take   INSULIN SYRINGE 1CC/31GX5/16" 31G X 5/16" 1 ML Misc 2x daily   levothyroxine 75 MCG tablet Commonly known as: SYNTHROID Take 1 tablet (75 mcg total) by mouth daily.   metFORMIN 1000 MG tablet Commonly known as: GLUCOPHAGE Take 1 tablet (1,000 mg total) by mouth daily with breakfast.   methocarbamol 750 MG tablet Commonly  known as: ROBAXIN Take 1 tablet (750 mg total) by mouth 2 (two) times daily. What changed: when to take this   multivitamin Chew chewable tablet Chew 1 tablet by mouth daily.   multivitamin with minerals Tabs tablet Take 1 tablet by mouth daily.   nitrofurantoin (macrocrystal-monohydrate) 100 MG capsule Commonly known as: Macrobid Take 1 capsule (100 mg total) by mouth 2 (two) times daily.   Ozempic (0.25 or 0.5 MG/DOSE) 2 MG/1.5ML Sopn Generic drug: Semaglutide(0.25 or 0.5MG/DOS) Inject 0.375 mLs (0.5 mg total) into the skin once a week. Started by: Dorita Sciara, MD   pramipexole 0.5 MG tablet Commonly known as: MIRAPEX TAKE 1 TABLET BY MOUTH AT BEDTIME.   rivaroxaban 20 MG Tabs tablet Commonly known as: XARELTO Take 1 tablet (20 mg total) by mouth daily.   senna-docusate 8.6-50 MG tablet Commonly known as: Senokot-S Take 1 tablet by mouth 2 (two) times daily.   sertraline 100 MG tablet Commonly known as: ZOLOFT TAKE 2 TABLETS (200 MG TOTAL) BY MOUTH AT BEDTIME. What changed: when to take this        OBJECTIVE:   Vital Signs: BP 138/78 (BP Location: Left Arm, Patient Position: Sitting, Cuff Size: Large)    Pulse 78    Ht '5\' 7"'  (1.702 m)    Wt (!) 404 lb 3.2 oz (183.3 kg)    SpO2 98%    BMI 63.31 kg/m   Wt Readings from Last 3 Encounters:  07/20/19 (!) 404 lb 3.2 oz (183.3 kg)  06/19/19 (!) 403 lb (182.8 kg)  04/14/19 (!) 390 lb (176.9 kg)     Exam: General: Pt appears well and is in NAD  Neck: General: Supple without  adenopathy. Thyroid: Thyroid size normal.  No goiter or nodules appreciated. No thyroid bruit.  Lungs: Clear with good BS bilat with no rales, rhonchi, or wheezes  Heart: RRR with normal S1 and S2 and no gallops; no murmurs; no rub  Abdomen: Normoactive bowel sounds, soft, nontender, without masses or organomegaly palpable  Extremities: Trace edema   Neuro: MS is good with appropriate affect, pt is alert and Ox3     DATA REVIEWED:  Lab Results  Component Value Date   HGBA1C 10.1 (A) 06/19/2019   HGBA1C 8.7 (A) 11/05/2018   HGBA1C 9.6 (A) 09/17/2018   Lab Results  Component Value Date   MICROALBUR 15.7 (H) 11/05/2018   LDLCALC 78 11/28/2012   CREATININE 0.92 04/09/2019   Lab Results  Component Value Date   MICRALBCREAT 14.3 11/05/2018     Lab Results  Component Value Date   CHOL 219 (H) 11/05/2018   HDL 40.10 11/05/2018   LDLCALC 78 11/28/2012   LDLDIRECT 151.0 11/05/2018   TRIG 250.0 (H) 11/05/2018   CHOLHDL 5 11/05/2018         ASSESSMENT / PLAN / RECOMMENDATIONS:   1) Type 2 Diabetes Mellitus, Poorly  controlled, With retinopathic and neuropathic complications - Most recent A1c of 10.1 %. Goal A1c < 7.0 %.   - Poorly controlled diabetes, pt admits to inability to follow a low carb diet  - We discussed Add-on therapy with GLP-1 agonists , discussed GI side effects, no prior hx of pancreatitis. Unfortunately, she is in the donut hole , she was provided with pt assistance forms for the ozempic. She was also provided with a sample to start as below   - We also discussed that will consider switching insulin to U500 once she is on a steady dose  MEDICATIONS:  - Continue Metformin 500 mg daily beakfast  - Increase Humalog  Mix 110 units BID  - Start Ozempic 0.25 mg weekly for 6 weeks, than increase to 0.5 mg weekly if no side effects   EDUCATION / INSTRUCTIONS:  BG monitoring instructions: Patient is instructed to check her blood sugars 2 times a day, before  breakfast and supper.  Call [facility] if: BG persistently < 70   I reviewed the Rule of 15 for the treatment of hypoglycemia in detail with the patient. Literature supplied.    2) Diabetic complications:   Eye: Does have known diabetic retinopathy. Per pt (mild)  Neuro/ Feet: Does have known diabetic peripheral neuropathy .   Renal: Patient does not have known baseline CKD. She   is not on an ACEI/ARB at present.   3) Dyslipidemia: Tolerating Atorvastatin 10 mg daily       F/U in 3 months    Signed electronically by: Mack Guise, MD  [facility] Endocrinology  [facility] [address] [place], [place] [address] Phone: [phone] FAX: [phone]   CC: Laurey Morale, [place] [address] Phone: [phone]  Fax: [phone]  Return to Endocrinology clinic as below: Future Appointments  Date Time Provider [place]  10/22/2019 10:10 AM Caela Huot, Melanie Crazier, MD [facility] None

## 2019-07-20 NOTE — Patient Instructions (Signed)
-   Continue Metformin 500 mg, 1 table daily - Humalog Mix  110 units With Breakfast and 110 units with Supper  - Ozempic 0.25 mg weekly for 6 weeks, if no side effects please increase to 0.5 mg weekly      HOW TO TREAT LOW BLOOD SUGARS (Blood sugar LESS THAN 70 MG/DL)  Please follow the RULE OF 15 for the treatment of hypoglycemia treatment (when your (blood sugars are less than 70 mg/dL)    STEP 1: Take 15 grams of carbohydrates when your blood sugar is low, which includes:   3-4 GLUCOSE TABS  OR  3-4 OZ OF JUICE OR REGULAR SODA OR  ONE TUBE OF GLUCOSE GEL     STEP 2: RECHECK blood sugar in 15 MINUTES STEP 3: If your blood sugar is still low at the 15 minute recheck --> then, go back to STEP 1 and treat AGAIN with another 15 grams of carbohydrates.

## 2019-07-28 ENCOUNTER — Other Ambulatory Visit: Payer: Self-pay | Admitting: Family Medicine

## 2019-07-31 ENCOUNTER — Other Ambulatory Visit: Payer: Self-pay | Admitting: Family Medicine

## 2019-07-31 DIAGNOSIS — Z1231 Encounter for screening mammogram for malignant neoplasm of breast: Secondary | ICD-10-CM

## 2019-08-11 ENCOUNTER — Other Ambulatory Visit: Payer: Self-pay | Admitting: Family Medicine

## 2019-08-11 ENCOUNTER — Other Ambulatory Visit: Payer: Self-pay

## 2019-08-11 ENCOUNTER — Ambulatory Visit
Admission: RE | Admit: 2019-08-11 | Discharge: 2019-08-11 | Disposition: A | Discharge status: Home / Self Care | Payer: Medicare Other | Source: Ambulatory Visit | Attending: Family Medicine | Admitting: Family Medicine

## 2019-08-11 DIAGNOSIS — N644 Mastodynia: Secondary | ICD-10-CM

## 2019-08-11 DIAGNOSIS — Z1231 Encounter for screening mammogram for malignant neoplasm of breast: Secondary | ICD-10-CM

## 2019-08-21 ENCOUNTER — Ambulatory Visit
Admission: RE | Admit: 2019-08-21 | Discharge: 2019-08-21 | Disposition: A | Discharge status: Home / Self Care | Payer: Medicare Other | Source: Ambulatory Visit | Attending: Family Medicine | Admitting: Family Medicine

## 2019-08-21 ENCOUNTER — Other Ambulatory Visit: Payer: Self-pay

## 2019-08-21 DIAGNOSIS — R928 Other abnormal and inconclusive findings on diagnostic imaging of breast: Secondary | ICD-10-CM | POA: Diagnosis not present

## 2019-08-21 DIAGNOSIS — N644 Mastodynia: Secondary | ICD-10-CM

## 2019-08-21 DIAGNOSIS — Z853 Personal history of malignant neoplasm of breast: Secondary | ICD-10-CM | POA: Diagnosis not present

## 2019-08-21 HISTORY — DX: Malignant neoplasm of unspecified site of unspecified female breast: C50.919

## 2019-09-03 ENCOUNTER — Encounter: Payer: Self-pay | Admitting: Family Medicine

## 2019-09-03 DIAGNOSIS — N644 Mastodynia: Secondary | ICD-10-CM

## 2019-09-07 NOTE — Telephone Encounter (Signed)
I ordered the MRI

## 2019-09-17 ENCOUNTER — Other Ambulatory Visit: Payer: Medicare Other

## 2019-09-23 DIAGNOSIS — R05 Cough: Secondary | ICD-10-CM | POA: Diagnosis not present

## 2019-09-23 DIAGNOSIS — J069 Acute upper respiratory infection, unspecified: Secondary | ICD-10-CM | POA: Diagnosis not present

## 2019-09-30 ENCOUNTER — Other Ambulatory Visit: Payer: Self-pay | Admitting: Family Medicine

## 2019-09-30 DIAGNOSIS — Z79891 Long term (current) use of opiate analgesic: Secondary | ICD-10-CM | POA: Diagnosis not present

## 2019-09-30 DIAGNOSIS — M961 Postlaminectomy syndrome, not elsewhere classified: Secondary | ICD-10-CM | POA: Diagnosis not present

## 2019-10-03 ENCOUNTER — Other Ambulatory Visit: Payer: Medicare Other

## 2019-10-11 ENCOUNTER — Other Ambulatory Visit: Payer: Self-pay

## 2019-10-11 ENCOUNTER — Ambulatory Visit
Admission: RE | Admit: 2019-10-11 | Discharge: 2019-10-11 | Disposition: A | Discharge status: Home / Self Care | Payer: Medicare Other | Source: Ambulatory Visit | Attending: Family Medicine | Admitting: Family Medicine

## 2019-10-11 DIAGNOSIS — N644 Mastodynia: Secondary | ICD-10-CM

## 2019-10-11 DIAGNOSIS — N6489 Other specified disorders of breast: Secondary | ICD-10-CM | POA: Diagnosis not present

## 2019-10-11 MED ORDER — GADOBUTROL 1 MMOL/ML IV SOLN
10.0000 mL | Freq: Once | INTRAVENOUS | Status: AC | PRN
  Administered 2019-10-11: 10 mL via INTRAVENOUS

## 2019-10-18 ENCOUNTER — Other Ambulatory Visit: Payer: Medicare Other

## 2019-10-22 ENCOUNTER — Ambulatory Visit: Payer: Medicare Other | Admitting: Internal Medicine

## 2019-10-26 ENCOUNTER — Other Ambulatory Visit: Payer: Self-pay | Admitting: Internal Medicine

## 2019-10-26 ENCOUNTER — Other Ambulatory Visit: Payer: Self-pay | Admitting: Family Medicine

## 2019-10-26 DIAGNOSIS — E785 Hyperlipidemia, unspecified: Secondary | ICD-10-CM

## 2019-11-17 ENCOUNTER — Other Ambulatory Visit: Payer: Self-pay | Admitting: Internal Medicine

## 2019-11-17 NOTE — Progress Notes (Deleted)
Name: Cheyenne Guzman  Age/ Sex: 54 y.o., female   MRN/ DOB: [id number], Jul 22, 1965     PCP: Laurey Morale, MD   Reason for Endocrinology Evaluation: Type 2 Diabetes Mellitus  Initial Endocrine Consultative Visit: 08/06/2016    PATIENT IDENTIFIER: [name] is a 54 y.o. female with a past medical history of T2DM, HTN, OSA. The patient has followed with Endocrinology clinic since 08/06/2016 for consultative assistance with management of her diabetes.  DIABETIC HISTORY:  [name] was diagnosed with T2DM in 2000. Has been on Metformin without side effects  .Has been on insulin since 2009. Her hemoglobin A1c has ranged from 8.2% in 2017, peaking at 10.3% in 2019.  Re-established with me 08/2018. Stopped lantus and started Novolog mix as well as metformin.    Ozempic started 06/2019 SUBJECTIVE:   Today (11/18/2019): [name] is here for a follow up on diabetes management . She has not been to our clinic in 8 months.  She is accompanied by her daughter [name]. She checks her blood sugars occasionally. She did not bring meter today. She has been in the donut hole since 03/2019 but able to obtain her medications. The patient has not had hypoglycemic episodes since the last clinic.   HOME REGIMEN:  Huamlog Mix 110 units BID Metformin 500 mg, with breakfast  Ozempic 0.5 mg weekly  Lipitor 10 mg daily    Statin: yes ACE-I/ARB: no Prior Diabetic Education: Yes   METER DOWNLOAD SUMMARY: did not bring     DIABETIC COMPLICATIONS: Microvascular complications:   Neuropathy, ? Mild retinopathy   Denies: CKD  Last Eye Exam: Completed 2019  Macrovascular complications:   TIA  Denies: CAD, CVA, PVD   HISTORY:  Past Medical History:  Past Medical History:  Diagnosis Date  . Anxiety    per pt. - mild   . Arthritis    back & ankles   . Asthma    seasonal   . Breast cancer ([place])   . Cancer [facility])    breast, sees Dr. Jana Hakim , Left - 11/2007  . Complication of  anesthesia 2013   severe muscle spasms-sore-no doc  . Depression   . Diabetes mellitus    sees Dr. Chalmers Cater, diagnosed 2003  . HA (headache)   . Hearing loss    Went to [facility] Throat,Dr [facility]  . Hyperlipidemia   . Kidney infection    - hosp. [facility]- septic- 10/2009  . Low back pain   . Peripheral neuropathy    R sided numbness- face & jaw  . Sleep apnea 9/15   severe-just started cpap-doing well  . TIA (transient ischemic attack)    in 2007,2008, and 2011  . Tinnitus of right ear   . Urosepsis 08-2009   with Klebsiella   Past Surgical History:  Past Surgical History:  Procedure Laterality Date  . ABDOMINAL HYSTERECTOMY  08/2004  . BREAST BIOPSY  06/22/2011   Procedure: BREAST BIOPSY;  Surgeon: Stark Klein, MD;  Location: [place];  Service: General;  Laterality: Right;  . BREAST LUMPECTOMY    . BREAST SURGERY  11/2007   left ductal carcinoma in situ  . CARPAL TUNNEL RELEASE Right 10/13/2013   Procedure: RIGHT CARPAL TUNNEL RELEASE;  Surgeon: Hessie Dibble, MD;  Location: [place];  Service: Orthopedics;  Laterality: Right;  . CARPAL TUNNEL RELEASE Left 11/24/2013   Procedure: LEFT CARPAL TUNNEL RELEASE WITH CYST EXCISION ;  Surgeon: Collier Salina  Autumn Patty, MD;  Location: [place];  Service: Orthopedics;  Laterality: Left;  . EAR CYST EXCISION Left 11/24/2013   Procedure: CYST REMOVAL;  Surgeon: Hessie Dibble, MD;  Location: [place];  Service: Orthopedics;  Laterality: Left;  . FOOT SURGERY  2000   plantar fasciitis-left  . LUMBAR DISC SURGERY  03-29-14   Fusion, revision 04-27-14 L4-5 Cauda Equina with graft  . NERVE GRAFT  06-01-08   cadaver graft to right inferior alveolar nerve  [place]    Social History:  reports that she quit smoking about 27 years ago. She has never used smokeless tobacco. She reports current alcohol use. She reports that she does not use drugs. Family History:  Family History    Problem Relation Age of Onset  . Cancer Mother        breast  . Cancer Father        lung  . Diabetes Father   . Cancer Sister        colon  . Cancer Maternal Aunt        breast - both  . Diabetes Sister   . Anesthesia problems Neg Hx      HOME MEDICATIONS: Allergies as of 11/18/2019      Reactions   Vicodin [hydrocodone-acetaminophen] Nausea And Vomiting      Medication List       Accurate as of November 18, 2019  7:13 AM. If you have any questions, ask your nurse or doctor.        albuterol (2.5 MG/3ML) 0.083% nebulizer solution Commonly known as: PROVENTIL 1 VIAL IN NEBULIZER EVERY 4 HOURS AS NEEDED FOR WHEEZING What changed: See the new instructions.   albuterol 108 (90 Base) MCG/ACT inhaler Commonly known as: VENTOLIN HFA Inhale 2 puffs into the lungs every 4 (four) hours as needed for wheezing or shortness of breath. What changed: Another medication with the same name was changed. Make sure you understand how and when to take each.   ALPRAZolam 1 MG tablet Commonly known as: XANAX TAKE 1 TABLET BY MOUTH THREE TIMES A DAY   atorvastatin 10 MG tablet Commonly known as: LIPITOR TAKE 1 TABLET BY MOUTH EVERYDAY AT BEDTIME   buprenorphine 15 MCG/HR Commonly known as: BUTRANS Place 1 patch onto the skin every Saturday.   ciprofloxacin 500 MG tablet Commonly known as: Cipro Take 1 tablet (500 mg total) by mouth 2 (two) times daily.   Contour Next Monitor w/Device Kit 1 each by Does not apply route 2 (two) times daily. To check blood sugars twice a day.   furosemide 20 MG tablet Commonly known as: LASIX Take 1 tablet (20 mg total) by mouth daily. Take extra 29m as needed if swelling worse.   gabapentin 300 MG capsule Commonly known as: NEURONTIN Take 300 mg by mouth 2 (two) times daily.   glucose blood test strip Use as instructed   glucose blood test strip Commonly known as: Contour Next Test Use to test blood sugar 2 times daily   HumaLOG Mix  75/25 (75-25) 100 UNIT/ML Susp injection Generic drug: insulin lispro protamine-lispro INJECT 110 UNITS INTO THE SKIN 2 TIMES DAILY WITH A MEAL.   INSULIN SYRINGE 1CC/31GX5/16" 31G X 5/16" 1 ML Misc 2x daily   levothyroxine 75 MCG tablet Commonly known as: SYNTHROID TAKE 1 TABLET BY MOUTH EVERY DAY   metFORMIN 1000 MG tablet Commonly known as: GLUCOPHAGE Take 1 tablet (1,000 mg total) by mouth daily with  breakfast.   methocarbamol 750 MG tablet Commonly known as: ROBAXIN Take 1 tablet (750 mg total) by mouth 2 (two) times daily. What changed: when to take this   multivitamin Chew chewable tablet Chew 1 tablet by mouth daily.   multivitamin with minerals Tabs tablet Take 1 tablet by mouth daily.   nitrofurantoin (macrocrystal-monohydrate) 100 MG capsule Commonly known as: Macrobid Take 1 capsule (100 mg total) by mouth 2 (two) times daily.   Ozempic (0.25 or 0.5 MG/DOSE) 2 MG/1.5ML Sopn Generic drug: Semaglutide(0.25 or 0.5MG/DOS) Inject 0.375 mLs (0.5 mg total) into the skin once a week.   pramipexole 0.5 MG tablet Commonly known as: MIRAPEX TAKE 1 TABLET BY MOUTH AT BEDTIME.   senna-docusate 8.6-50 MG tablet Commonly known as: Senokot-S Take 1 tablet by mouth 2 (two) times daily.   sertraline 100 MG tablet Commonly known as: ZOLOFT TAKE 2 TABLETS (200 MG TOTAL) BY MOUTH AT BEDTIME.   Xarelto 20 MG Tabs tablet Generic drug: rivaroxaban TAKE 1 TABLET BY MOUTH EVERY DAY        OBJECTIVE:   Vital Signs: There were no vitals taken for this visit.  Wt Readings from Last 3 Encounters:  07/20/19 (!) 404 lb 3.2 oz (183.3 kg)  06/19/19 (!) 403 lb (182.8 kg)  04/14/19 (!) 390 lb (176.9 kg)     Exam: General: Pt appears well and is in NAD  Neck: General: Supple without adenopathy. Thyroid: Thyroid size normal.  No goiter or nodules appreciated. No thyroid bruit.  Lungs: Clear with good BS bilat with no rales, rhonchi, or wheezes  Heart: RRR with normal S1 and  S2 and no gallops; no murmurs; no rub  Abdomen: Normoactive bowel sounds, soft, nontender, without masses or organomegaly palpable  Extremities: Trace edema   Neuro: MS is good with appropriate affect, pt is alert and Ox3     DATA REVIEWED:  Lab Results  Component Value Date   HGBA1C 10.1 (A) 06/19/2019   HGBA1C 8.7 (A) 11/05/2018   HGBA1C 9.6 (A) 09/17/2018   Lab Results  Component Value Date   MICROALBUR 15.7 (H) 11/05/2018   LDLCALC 78 11/28/2012   CREATININE 0.92 04/09/2019   Lab Results  Component Value Date   MICRALBCREAT 14.3 11/05/2018     Lab Results  Component Value Date   CHOL 219 (H) 11/05/2018   HDL 40.10 11/05/2018   LDLCALC 78 11/28/2012   LDLDIRECT 151.0 11/05/2018   TRIG 250.0 (H) 11/05/2018   CHOLHDL 5 11/05/2018         ASSESSMENT / PLAN / RECOMMENDATIONS:   1) Type 2 Diabetes Mellitus, Poorly  controlled, With retinopathic and neuropathic complications - Most recent A1c of 10.1 %. Goal A1c < 7.0 %.   - Poorly controlled diabetes, pt admits to inability to follow a low carb diet  - We discussed Add-on therapy with GLP-1 agonists , discussed GI side effects, no prior hx of pancreatitis. Unfortunately, she is in the donut hole , she was provided with pt assistance forms for the ozempic. She was also provided with a sample to start as below   - We also discussed that will consider switching insulin to U500 once she is on a steady dose   MEDICATIONS:  - Continue Metformin 500 mg daily beakfast  - Increase Humalog  Mix 110 units BID  - Start Ozempic 0.25 mg weekly for 6 weeks, than increase to 0.5 mg weekly if no side effects   EDUCATION / INSTRUCTIONS:  BG monitoring  instructions: Patient is instructed to check her blood sugars 2 times a day, before breakfast and supper.  Call [facility] if: BG persistently < 70  . I reviewed the Rule of 15 for the treatment of hypoglycemia in detail with the patient. Literature  supplied.    2) Diabetic complications:   Eye: Does have known diabetic retinopathy. Per pt (mild)  Neuro/ Feet: Does have known diabetic peripheral neuropathy .   Renal: Patient does not have known baseline CKD. She   is not on an ACEI/ARB at present.   3) Dyslipidemia: Tolerating Atorvastatin 10 mg daily       F/U in 3 months    Signed electronically by: Mack Guise, MD  [facility] Endocrinology  [facility] [address] [place], [place] [address] Phone: [phone] FAX: [phone]   CC: Laurey Morale, [place] [address] Phone: [phone]  Fax: [phone]  Return to Endocrinology clinic as below: Future Appointments  Date Time Provider [place]  11/18/2019 11:10 AM Italia Wolfert, Melanie Crazier, MD [facility] None

## 2019-11-18 ENCOUNTER — Ambulatory Visit: Payer: Medicare Other | Admitting: Internal Medicine

## 2019-11-22 ENCOUNTER — Encounter: Payer: Self-pay | Admitting: Family Medicine

## 2019-11-23 NOTE — Telephone Encounter (Signed)
Call in Cipro 500 mg BID for 7 days  

## 2019-11-24 MED ORDER — CIPROFLOXACIN HCL 500 MG PO TABS: 500.0000 mg | ORAL_TABLET | Freq: Two times a day (BID) | ORAL | 0 refills | Status: DC

## 2019-12-16 ENCOUNTER — Ambulatory Visit: Payer: Medicare Other | Admitting: Internal Medicine

## 2019-12-23 ENCOUNTER — Other Ambulatory Visit: Payer: Self-pay | Admitting: Family Medicine

## 2019-12-23 DIAGNOSIS — M7918 Myalgia, other site: Secondary | ICD-10-CM | POA: Diagnosis not present

## 2019-12-23 DIAGNOSIS — Z79899 Other long term (current) drug therapy: Secondary | ICD-10-CM | POA: Diagnosis not present

## 2019-12-23 DIAGNOSIS — M961 Postlaminectomy syndrome, not elsewhere classified: Secondary | ICD-10-CM | POA: Diagnosis not present

## 2019-12-23 DIAGNOSIS — M5441 Lumbago with sciatica, right side: Secondary | ICD-10-CM | POA: Diagnosis not present

## 2020-02-12 ENCOUNTER — Ambulatory Visit (INDEPENDENT_AMBULATORY_CARE_PROVIDER_SITE_OTHER): Payer: Medicare Other

## 2020-02-12 DIAGNOSIS — Z0000 Encounter for general adult medical examination without abnormal findings: Secondary | ICD-10-CM | POA: Diagnosis not present

## 2020-02-12 DIAGNOSIS — Z1211 Encounter for screening for malignant neoplasm of colon: Secondary | ICD-10-CM

## 2020-02-12 DIAGNOSIS — E1165 Type 2 diabetes mellitus with hyperglycemia: Secondary | ICD-10-CM | POA: Diagnosis not present

## 2020-02-12 DIAGNOSIS — F32A Depression, unspecified: Secondary | ICD-10-CM

## 2020-02-12 DIAGNOSIS — Z794 Long term (current) use of insulin: Secondary | ICD-10-CM

## 2020-02-12 DIAGNOSIS — F419 Anxiety disorder, unspecified: Secondary | ICD-10-CM

## 2020-02-12 DIAGNOSIS — Z6841 Body Mass Index (BMI) 40.0 and over, adult: Secondary | ICD-10-CM

## 2020-02-12 NOTE — Patient Instructions (Signed)
[name] , Thank you for taking time to come for your Medicare Wellness Visit. I appreciate your ongoing commitment to your health goals. Please review the following plan we discussed and let me know if I can assist you in the future.   Screening recommendations/referrals: Colonoscopy: Currently due, orders placed this visit  Mammogram: Up to date, next due 08/20/2020 Bone Density: Not required until age 56 Recommended yearly ophthalmology/optometry visit for glaucoma screening and checkup Recommended yearly dental visit for hygiene and checkup  Vaccinations: Influenza vaccine: Up to date, next due fall 2022 Pneumococcal vaccine: Up to date, next due at age 28 Tdap vaccine: Currently due, if you would like to receive at this time you may contact your insurance company to discuss out of pocket cost or you may await an injury to receive  Shingles vaccine: Currently due for Shingrix, if you wish to receive we recommend that you do so at your local pharmacy as it is less expensive     Advanced directives: Advance directive discussed with you today. Even though you declined this today please call our office should you change your mind and we can give you the proper paperwork for you to fill out.   Conditions/risks identified: I have placed referrals for you to our [facility] as well as to pharmacy so that we may assist with things as we discussed today.  Next appointment: 02/19/2020 @ 3:00 PM with Dr. Clent Ridges   Preventive Care 40-64 Years, Female Preventive care refers to lifestyle choices and visits with your health care provider that can promote health and wellness. What does preventive care include?  A yearly physical exam. This is also called an annual well check.  Dental exams once or twice a year.  Routine eye exams. Ask your health care provider how often you should have your eyes checked.  Personal lifestyle choices, including:  Daily care of your teeth and  gums.  Regular physical activity.  Eating a healthy diet.  Avoiding tobacco and drug use.  Limiting alcohol use.  Practicing safe sex.  Taking low-dose aspirin daily starting at age 76.  Taking vitamin and mineral supplements as recommended by your health care provider. What happens during an annual well check? The services and screenings done by your health care provider during your annual well check will depend on your age, overall health, lifestyle risk factors, and family history of disease. Counseling  Your health care provider may ask you questions about your:  Alcohol use.  Tobacco use.  Drug use.  Emotional well-being.  Home and relationship well-being.  Sexual activity.  Eating habits.  Work and work Astr[employer].  Method of birth control.  Menstrual cycle.  Pregnancy history. Screening  You may have the following tests or measurements:  Height, weight, and BMI.  Blood pressure.  Lipid and cholesterol levels. These may be checked every 5 years, or more frequently if you are over 33 years old.  Skin check.  Lung cancer screening. You may have this screening every year starting at age 76 if you have a 30-pack-year history of smoking and currently smoke or have quit within the past 15 years.  Fecal occult blood test (FOBT) of the stool. You may have this test every year starting at age 28.  Flexible sigmoidoscopy or colonoscopy. You may have a sigmoidoscopy every 5 years or a colonoscopy every 10 years starting at age 75.  Hepatitis C blood test.  Hepatitis B blood test.  Sexually transmitted disease (STD) testing.  Diabetes screening. This is done by checking your blood sugar (glucose) after you have not eaten for a while (fasting). You may have this done every 1-3 years.  Mammogram. This may be done every 1-2 years. Talk to your health care provider about when you should start having regular mammograms. This may depend on whether you have a  family history of breast cancer.  BRCA-related cancer screening. This may be done if you have a family history of breast, ovarian, tubal, or peritoneal cancers.  Pelvic exam and Pap test. This may be done every 3 years starting at age 6. Starting at age 67, this may be done every 5 years if you have a Pap test in combination with an HPV test.  Bone density scan. This is done to screen for osteoporosis. You may have this scan if you are at high risk for osteoporosis. Discuss your test results, treatment options, and if necessary, the need for more tests with your health care provider. Vaccines  Your health care provider may recommend certain vaccines, such as:  Influenza vaccine. This is recommended every year.  Tetanus, diphtheria, and acellular pertussis (Tdap, Td) vaccine. You may need a Td booster every 10 years.  Zoster vaccine. You may need this after age 35.  Pneumococcal 13-valent conjugate (PCV13) vaccine. You may need this if you have certain conditions and were not previously vaccinated.  Pneumococcal polysaccharide (PPSV23) vaccine. You may need one or two doses if you smoke cigarettes or if you have certain conditions. Talk to your health care provider about which screenings and vaccines you need and how often you need them. This information is not intended to replace advice given to you by your health care provider. Make sure you discuss any questions you have with your health care provider. Document Released: 02/04/2015 Document Revised: 09/28/2015 Document Reviewed: 11/09/2014 Elsevier Interactive Patient Education  2017 [employer] Prevention in the Home Falls can cause injuries. They can happen to people of all ages. There are many things you can do to make your home safe and to help prevent falls. What can I do on the outside of my home?  Regularly fix the edges of walkways and driveways and fix any cracks.  Remove anything that might make you trip as  you walk through a door, such as a raised step or threshold.  Trim any bushes or trees on the path to your home.  Use bright outdoor lighting.  Clear any walking paths of anything that might make someone trip, such as rocks or tools.  Regularly check to see if handrails are loose or broken. Make sure that both sides of any steps have handrails.  Any raised decks and porches should have guardrails on the edges.  Have any leaves, snow, or ice cleared regularly.  Use sand or salt on walking paths during winter.  Clean up any spills in your garage right away. This includes oil or grease spills. What can I do in the bathroom?  Use night lights.  Install grab bars by the toilet and in the tub and shower. Do not use towel bars as grab bars.  Use non-skid mats or decals in the tub or shower.  If you need to sit down in the shower, use a plastic, non-slip stool.  Keep the floor dry. Clean up any water that spills on the floor as soon as it happens.  Remove soap buildup in the tub or shower regularly.  Attach bath mats securely  with double-sided non-slip rug tape.  Do not have throw rugs and other things on the floor that can make you trip. What can I do in the bedroom?  Use night lights.  Make sure that you have a light by your bed that is easy to reach.  Do not use any sheets or blankets that are too big for your bed. They should not hang down onto the floor.  Have a firm chair that has side arms. You can use this for support while you get dressed.  Do not have throw rugs and other things on the floor that can make you trip. What can I do in the kitchen?  Clean up any spills right away.  Avoid walking on wet floors.  Keep items that you use a lot in easy-to-reach places.  If you need to reach something above you, use a strong step stool that has a grab bar.  Keep electrical cords out of the way.  Do not use floor polish or wax that makes floors slippery. If you must  use wax, use non-skid floor wax.  Do not have throw rugs and other things on the floor that can make you trip. What can I do with my stairs?  Do not leave any items on the stairs.  Make sure that there are handrails on both sides of the stairs and use them. Fix handrails that are broken or loose. Make sure that handrails are as long as the stairways.  Check any carpeting to make sure that it is firmly attached to the stairs. Fix any carpet that is loose or worn.  Avoid having throw rugs at the top or bottom of the stairs. If you do have throw rugs, attach them to the floor with carpet tape.  Make sure that you have a light switch at the top of the stairs and the bottom of the stairs. If you do not have them, ask someone to add them for you. What else can I do to help prevent falls?  Wear shoes that:  Do not have high heels.  Have rubber bottoms.  Are comfortable and fit you well.  Are closed at the toe. Do not wear sandals.  If you use a stepladder:  Make sure that it is fully opened. Do not climb a closed stepladder.  Make sure that both sides of the stepladder are locked into place.  Ask someone to hold it for you, if possible.  Clearly mark and make sure that you can see:  Any grab bars or handrails.  First and last steps.  Where the edge of each step is.  Use tools that help you move around (mobility aids) if they are needed. These include:  Canes.  Walkers.  Scooters.  Crutches.  Turn on the lights when you go into a dark area. Replace any light bulbs as soon as they burn out.  Set up your furniture so you have a clear path. Avoid moving your furniture around.  If any of your floors are uneven, fix them.  If there are any pets around you, be aware of where they are.  Review your medicines with your doctor. Some medicines can make you feel dizzy. This can increase your chance of falling. Ask your doctor what other things that you can do to help prevent  falls. This information is not intended to replace advice given to you by your health care provider. Make sure you discuss any questions you have with your health care  provider. Document Released: 11/04/2008 Document Revised: 06/16/2015 Document Reviewed: 02/12/2014 Elsevier Interactive Patient Education  2017 [employer].

## 2020-02-12 NOTE — Progress Notes (Signed)
Subjective:   Cheyenne Guzman is a 55 y.o. female who presents for an Initial Medicare Annual Wellness Visit.  Attempted to connect to video visit. After multiple attempts unable to establish a secure connection.  I connected with Fortino Sic  today by telephone and verified that I am speaking with the correct person using two identifiers. Location patient: home Location provider: work Persons participating in the virtual visit: patient, provider.   I discussed the limitations, risks, security and privacy concerns of performing an evaluation and management service by telephone and the availability of in person appointments. I also discussed with the patient that there may be a patient responsible charge related to this service. The patient expressed understanding and verbally consented to this telephonic visit.    Interactive audio and video telecommunications were attempted between this provider and patient, however failed, due to patient having technical difficulties OR patient did not have access to video capability.  We continued and completed visit with audio only.      Review of Systems    N/A  Cardiac Risk Factors include: diabetes mellitus;dyslipidemia;obesity (BMI >30kg/m2);sedentary lifestyle     Objective:    Today's Vitals   02/12/20 1032  PainSc: 5    There is no height or weight on file to calculate BMI.  Advanced Directives 02/12/2020 04/09/2019 11/11/2018 07/31/2018 07/09/2018 06/19/2018 06/19/2018  Does Patient Have a Medical Advance Directive? _0  No No  Would patient like information on creating a medical advance directive? No - Patient declined No - Patient declined No - Patient declined No - Patient declined No - Patient declined No - Patient declined -  Pre-existing out of facility DNR order (yellow form or pink MOST form) - - - - - - -    Current Medications (verified) Outpatient Encounter Medications as of 02/12/2020  Medication Sig  . albuterol  (PROVENTIL) (2.5 MG/3ML) 0.083% nebulizer solution 1 VIAL IN NEBULIZER EVERY 4 HOURS AS NEEDED FOR WHEEZING (Patient taking differently: Take 2.5 mg by nebulization every 4 (four) hours as needed for wheezing.)  . albuterol (VENTOLIN HFA) 108 (90 Base) MCG/ACT inhaler Inhale 2 puffs into the lungs every 4 (four) hours as needed for wheezing or shortness of breath.  . ALPRAZolam (XANAX) 1 MG tablet TAKE 1 TABLET BY MOUTH THREE TIMES A DAY  . atorvastatin (LIPITOR) 10 MG tablet TAKE 1 TABLET BY MOUTH EVERYDAY AT BEDTIME  . Blood Glucose Monitoring Suppl (CONTOUR NEXT MONITOR) w/Device KIT 1 each by Does not apply route 2 (two) times daily. To check blood sugars twice a day.  . Buprenorphine 15 MCG/HR PTWK Place 1 patch onto the skin every Saturday.   . gabapentin (NEURONTIN) 300 MG capsule Take 300 mg by mouth 2 (two) times daily.   Marland Kitchen glucose blood (CONTOUR NEXT TEST) test strip Use to test blood sugar 2 times daily  . glucose blood test strip Use as instructed  . HUMALOG MIX 75/25 (75-25) 100 UNIT/ML SUSP injection INJECT 110 UNITS INTO THE SKIN 2 TIMES DAILY WITH A MEAL.  Marland Kitchen Insulin Syringe-Needle U-100 (INSULIN SYRINGE 1CC/31GX5/16") 31G X 5/16" 1 ML MISC 2x daily  . levothyroxine (SYNTHROID) 75 MCG tablet TAKE 1 TABLET BY MOUTH EVERY DAY  . lidocaine (XYLOCAINE) 5 % ointment Apply to areas of pain up to bid.  . metFORMIN (GLUCOPHAGE) 1000 MG tablet Take 1 tablet (1,000 mg total) by mouth daily with breakfast.  . methocarbamol (ROBAXIN) 750 MG tablet Take 1 tablet (750 mg  total) by mouth 2 (two) times daily. (Patient taking differently: Take 750 mg by mouth 3 (three) times daily.)  . Multiple Vitamin (MULTIVITAMIN WITH MINERALS) TABS tablet Take 1 tablet by mouth daily.  . multivitamin (VIT W/EXTRA C) CHEW chewable tablet Chew 1 tablet by mouth daily.   . pramipexole (MIRAPEX) 0.5 MG tablet TAKE 1 TABLET BY MOUTH AT BEDTIME.  Marland Kitchen sertraline (ZOLOFT) 100 MG tablet TAKE 2 TABLETS (200 MG TOTAL) BY  MOUTH AT BEDTIME.  Marland Kitchen XARELTO 20 MG TABS tablet TAKE 1 TABLET BY MOUTH EVERY DAY  . furosemide (LASIX) 20 MG tablet Take 1 tablet (20 mg total) by mouth daily. Take extra 62m as needed if swelling worse. (Patient not taking: Reported on 02/12/2020)  . Semaglutide,0.25 or 0.5MG/DOS, (OZEMPIC, 0.25 OR 0.5 MG/DOSE,) 2 MG/1.5ML SOPN Inject 0.375 mLs (0.5 mg total) into the skin once a week. (Patient not taking: Reported on 02/12/2020)  . [DISCONTINUED] ciprofloxacin (CIPRO) 500 MG tablet Take 1 tablet (500 mg total) by mouth 2 (two) times daily.  . [DISCONTINUED] lidocaine (XYLOCAINE) 5 % ointment Apply topically.  . [DISCONTINUED] nitrofurantoin, macrocrystal-monohydrate, (MACROBID) 100 MG capsule Take 1 capsule (100 mg total) by mouth 2 (two) times daily.  . [DISCONTINUED] senna-docusate (SENOKOT-S) 8.6-50 MG tablet Take 1 tablet by mouth 2 (two) times daily. (Patient not taking: Reported on 02/12/2020)   No facility-administered encounter medications on file as of 02/12/2020.    Allergies (verified) Vicodin [hydrocodone-acetaminophen]   History: Past Medical History:  Diagnosis Date  . Anxiety    per pt. - mild   . Arthritis    back & ankles   . Asthma    seasonal   . [place] cancer ([place]   . Cancer ([facility]    breast, sees Dr. MJana Hakim, Left - 11/2007  . Complication of anesthesia 2013   severe muscle spasms-sore-no doc  . Depression   . Diabetes mellitus    sees Dr. BChalmers Cater diagnosed 2003  . HA (headache)   . [facility] loss    [facility] . Hyperlipidemia   . [facility]    [facility]  . Low back pain   . Peripheral neuropathy    R sided numbness- face & jaw  . Sleep apnea 9/15   severe-just started cpap-doing well  . TIA (transient ischemic attack)    in 2007,2008, and 2011  . Tinnitus of right ear   . Urosepsis 08-2009   with Klebsiella   Past Surgical History:  Procedure Laterality Date  . ABDOMINAL  HYSTERECTOMY  08/2004  . BREAST BIOPSY  06/22/2011   Procedure: BREAST BIOPSY;  Surgeon: FStark Klein MD;  [place]  Service: General;  Laterality: Right;  . BREAST LUMPECTOMY    . BREAST SURGERY  11/2007   left ductal carcinoma in situ  . CARPAL TUNNEL RELEASE Right 10/13/2013   Procedure: RIGHT CARPAL TUNNEL RELEASE;  Surgeon: PHessie Dibble MD;  [place]  Service: Orthopedics;  Laterality: Right;  . CARPAL TUNNEL RELEASE Left 11/24/2013   Procedure: LEFT CARPAL TUNNEL RELEASE WITH CYST EXCISION ;  Surgeon: PHessie Dibble MD;  [place]  Service: Orthopedics;  Laterality: Left;  . EAR CYST EXCISION Left 11/24/2013   Procedure: CYST REMOVAL;  Surgeon: PHessie Dibble MD;  [place]  Service: Orthopedics;  Laterality: Left;  . FOOT SURGERY  2000   plantar fasciitis-left  .  LUMBAR DISC SURGERY  03-29-14   Fusion, revision 04-27-14 L4-5 Cauda Equina with graft  . NERVE GRAFT  06-01-08   cadaver graft to right inferior alveolar nerve  in [place]   Family History  Problem Relation Age of Onset  . Cancer Mother        breast  . Cancer Father        lung  . Diabetes Father   . Cancer Sister        colon  . Cancer Maternal Aunt        breast - both  . Diabetes Sister   . Anesthesia problems Neg Hx    Social History   Socioeconomic History  . Marital status: Married    Spouse name: [name]  . Number of children: 2  . Years of education: 74  . Highest education level: Not on file  Occupational History    Comment: [employer]  Tobacco Use  . Smoking status: Former Smoker    Quit date: 02/19/1992    Years since quitting: 28.0  . Smokeless tobacco: Never Used  Vaping Use  . Vaping Use: Not on file  Substance and Sexual Activity  . Alcohol use: Yes    Comment: rare  . Drug use: No  . Sexual activity: Not on file  Other Topics Concern  . Not on file  Social History Narrative    Patient lives at home with her husband [name]) and two daughters.    Patient works for [employer] full time patient is on medical leave at this time.   Education two years of college.   Right handed.   Caffeine two times per day.   Social Determinants of Health   Financial Resource Strain: High Risk  . Difficulty of Paying Living Expenses: Hard  Food Insecurity: Food Insecurity Present  . Worried About Charity fundraiser in the Last Year: Sometimes true  . Ran Out of Food in the Last Year: Sometimes true  Transportation Needs: No Transportation Needs  . Lack of Transportation (Medical): No  . Lack of Transportation (Non-Medical): No  Physical Activity: Inactive  . Days of Exercise per Week: 0 days  . Minutes of Exercise per Session: 0 min  Stress: Stress Concern Present  . Feeling of Stress : To some extent  Social Connections: Moderately Integrated  . Frequency of Communication with Friends and Family: More than three times a week  . Frequency of Social Gatherings with Friends and Family: More than three times a week  . Attends Religious Services: More than 4 times per year  . Active Member of Clubs or Organizations: No  . Attends Archivist Meetings: Never  . Marital Status: Married    Tobacco Counseling Counseling given: Not Answered   Clinical Intake:  Pre-visit preparation completed: Yes  Pain : 0-10 Pain Score: 5  Pain Type: Chronic pain Pain Location:  (Diffused,) Pain Descriptors / Indicators: Dull,Aching Pain Onset: More than a month ago Pain Frequency: Constant Pain Relieving Factors: Pain patches, muscle relaxer, Gabapentin, resting  Pain Relieving Factors: Pain patches, muscle relaxer, Gabapentin, resting  Nutritional Risks: Unintentional weight gain (swelling in legs due to fluid build up has caused weight gain. Patient states needs a new prescription for diuretic) Diabetes: Yes CBG done?: No Did pt. bring in CBG monitor from home?:  No  How often do you need to have someone help you when you read instructions, pamphlets, or other written materials from your doctor or  pharmacy?: 1 - Never What is the last grade level you completed in school?: Some College  Diabetic?Nutrition Risk Assessment:  Has the patient had any N/V/D within the last 2 months?  No  Does the patient have any non-healing wounds?  No  Has the patient had any unintentional weight loss or weight gain?  Yes   Diabetes:  Is the patient diabetic?  Yes  If diabetic, was a CBG obtained today?  No  Did the patient bring in their glucometer from home?  No  How often do you monitor your CBG's? Patient states checks glucose every 2-3 days.   Financial Strains and Diabetes Management:  Are you having any financial strains with the device, your supplies or your medication? Yes .  Does the patient want to be seen by Chronic Care Management for management of their diabetes?  No  Would the patient like to be referred to a Nutritionist or for Diabetic Management?  No   Diabetic Exams:  Diabetic Eye Exam: Overdue for diabetic eye exam. Pt has been advised about the importance in completing this exam. Patient advised to call and schedule an eye exam. Diabetic Foot Exam: Overdue, Pt has been advised about the importance in completing this exam. Pt is scheduled for diabetic foot exam on 02/12/2020.   Interpreter Needed?: No  Information entered by :: SCrews, LPN   Activities of Daily Living In your present state of health, do you have any difficulty performing the following activities: 02/12/2020  Hearing? Y  Comment Patient states qualifies for hearing aids has not purchased yet  Vision? N  Difficulty concentrating or making decisions? N  Walking or climbing stairs? Y  Dressing or bathing? N  Doing errands, shopping? Y  Preparing Food and eating ? N  Using the Toilet? N  In the past six months, have you accidently leaked urine? Y  Comment has urinary  incontinence at times  Do you have problems with loss of bowel control? N  Managing your Medications? N  Managing your Finances? N  Housekeeping or managing your Housekeeping? Y  Some recent data might be hidden    Patient Care Team: Laurey Morale, MD as PCP - General Izora Gala, MD as Consulting Physician (Otolaryngology) Keene Breath., MD (Ophthalmology)  Indicate any recent Medical Services you may have received from other than [facility] providers in the past year (date may be approximate).     Assessment:   This is a routine wellness examination for [facility].  Hearing/Vision screen  Hearing Screening   _0  _1  _2  _3  _4  _5  _6  _7  _8   Right ear:           Left ear:           Vision Screening Comments: Patient states has not been to eye doctor since the pandemic. Has a hx of Retinopathy   Dietary issues and exercise activities discussed: Current Exercise Habits: The patient does not participate in regular exercise at present, Exercise limited by: Other - see comments (mobility issues, chronic pain)  Goals    . Exercise 3x per week (30 min per time)    . Weight (lb) < 350 lb (158.8 kg)      Depression Screen PHQ 2/9 Scores 02/12/2020 08/12/2018  PHQ - 2 Score 1 0  PHQ- 9 Score 10 -    Fall Risk Fall Risk  02/12/2020 08/12/2018  Falls in the past year? 0 0  Number falls in past yr: 0 0  Injury with  Fall? 0 0  Risk for fall due to : Impaired mobility -  Follow up Falls evaluation completed;Falls prevention discussed -    FALL RISK PREVENTION PERTAINING TO THE HOME:  Any stairs in or around the home? Yes  If so, are there any without handrails? No  Home free of loose throw rugs in walkways, pet beds, electrical cords, etc? Yes  Adequate lighting in your home to reduce risk of falls? Yes   ASSISTIVE DEVICES UTILIZED TO PREVENT FALLS:  Life alert? No  Use of a cane, walker or w/c? Yes  Grab bars in the bathroom? Yes  Shower chair or bench in  shower? Yes  Elevated toilet seat or a handicapped toilet? Yes    Cognitive Function:   Normal cognitive status assessed by direct observation by this Nurse Health Advisor. No abnormalities found.        Immunizations Immunization History  Administered Date(s) Administered  . Influenza Whole 10/23/2006  . Influenza, Quadrivalent, Recombinant, Inj, Pf 10/20/2017  . Influenza,inj,Quad PF,6+ Mos 11/28/2012, 03/05/2014, 11/04/2018  . Influenza-Unspecified 02/03/2020  . PFIZER(Purple Top)SARS-COV-2 Vaccination 07/06/2019, 07/27/2019  . Pneumococcal Polysaccharide-23 11/23/2006, 11/28/2012    TDAP status: Due, Education has been provided regarding the importance of this vaccine. Advised may receive this vaccine at local pharmacy or Health Dept. Aware to provide a copy of the vaccination record if obtained from local pharmacy or Health Dept. Verbalized acceptance and understanding.  Flu Vaccine status: Up to date  Pneumococcal vaccine status: Up to date  Covid-19 vaccine status: Completed vaccines  Qualifies for Shingles Vaccine? Yes   Zostavax completed No   Shingrix Completed?: No.    Education has been provided regarding the importance of this vaccine. Patient has been advised to call insurance company to determine out of pocket expense if they have not yet received this vaccine. Advised may also receive vaccine at local pharmacy or Health Dept. Verbalized acceptance and understanding.  Screening Tests Health Maintenance  Topic Date Due  . Hepatitis C Screening  Never done  . OPHTHALMOLOGY EXAM  Never done  . TETANUS/TDAP  Never done  . PAP SMEAR-Modifier  Never done  . COLONOSCOPY (Pts 45-67yr Insurance coverage will need to be confirmed)  Never done  . FOOT EXAM  10/17/2018  . COVID-19 Vaccine (3 - Pfizer risk 4-dose series) 08/24/2019  . URINE MICROALBUMIN  11/05/2019  . HEMOGLOBIN A1C  12/20/2019  . MAMMOGRAM  08/20/2021  . INFLUENZA VACCINE  Completed  . PNEUMOCOCCAL  POLYSACCHARIDE VACCINE AGE 70-64 HIGH RISK  Completed  . HIV Screening  Completed    Health Maintenance  Health Maintenance Due  Topic Date Due  . Hepatitis C Screening  Never done  . OPHTHALMOLOGY EXAM  Never done  . TETANUS/TDAP  Never done  . PAP SMEAR-Modifier  Never done  . COLONOSCOPY (Pts 45-475yrInsurance coverage will need to be confirmed)  Never done  . FOOT EXAM  10/17/2018  . COVID-19 Vaccine (3 - Pfizer risk 4-dose series) 08/24/2019  . URINE MICROALBUMIN  11/05/2019  . HEMOGLOBIN A1C  12/20/2019    Colorectal cancer screening: Referral to GI placed 02/12/2020. Pt aware the office will call re: appt.  Mammogram status: Completed 08/21/2019. Repeat every year  Bone Density status: Not due at this age   Lung Cancer Screening: (Low Dose CT Chest recommended if Age 56-80ears, 30 pack-year currently smoking OR have quit w/in 15years.) does not qualify.   Lung Cancer Screening Referral: N/A   Additional Screening:  Hepatitis C Screening: does qualify;  Vision Screening: Recommended annual ophthalmology exams for early detection of glaucoma and other disorders of the eye. Is the patient up to date with their annual eye exam?  No  Who is the provider or what is the name of the office in which the patient attends annual eye exams? Dr. Clydene Laming  If pt is not established with a provider, would they like to be referred to a provider to establish care? No .   Dental Screening: Recommended annual dental exams for proper oral hygiene  Community Resource Referral / Chronic Care Management: CRR required this visit?  Yes  CCM required this visit?  Yes     Plan:     I have personally reviewed and noted the following in the patient's chart:   . Medical and social history . Use of alcohol, tobacco or illicit drugs  . Current medications and supplements . Functional ability and status . Nutritional status . Physical activity . Advanced directives . List of other  physicians . Hospitalizations, surgeries, and ER visits in previous 12 months . Vitals . Screenings to include cognitive, depression, and falls . Referrals and appointments  In addition, I have reviewed and discussed with patient certain preventive protocols, quality metrics, and best practice recommendations. A written personalized care plan for preventive services as well as general preventive health recommendations were provided to patient.     Ofilia Neas, LPN   [id number]/[id number]   Nurse Notes: Patient expressed some financial concerns as well as stress concerns. Referrals placed to CCM and CRR

## 2020-02-18 ENCOUNTER — Other Ambulatory Visit: Payer: Self-pay

## 2020-02-19 ENCOUNTER — Ambulatory Visit (INDEPENDENT_AMBULATORY_CARE_PROVIDER_SITE_OTHER): Payer: Medicare Other | Admitting: Family Medicine

## 2020-02-19 ENCOUNTER — Encounter: Payer: Self-pay | Admitting: Family Medicine

## 2020-02-19 VITALS — BP 120/78 | HR 88 | Temp 98.1°F | Resp 23 | Wt >= 6400 oz

## 2020-02-19 DIAGNOSIS — R0602 Shortness of breath: Secondary | ICD-10-CM

## 2020-02-19 DIAGNOSIS — E1169 Type 2 diabetes mellitus with other specified complication: Secondary | ICD-10-CM | POA: Diagnosis not present

## 2020-02-19 DIAGNOSIS — I890 Lymphedema, not elsewhere classified: Secondary | ICD-10-CM

## 2020-02-19 DIAGNOSIS — E039 Hypothyroidism, unspecified: Secondary | ICD-10-CM | POA: Diagnosis not present

## 2020-02-19 DIAGNOSIS — E669 Obesity, unspecified: Secondary | ICD-10-CM

## 2020-02-19 MED ORDER — FUROSEMIDE 40 MG PO TABS
40.0000 mg | ORAL_TABLET | Freq: Two times a day (BID) | ORAL | 3 refills | Status: DC
Start: 2020-02-19 — End: 2020-05-17

## 2020-02-19 NOTE — Progress Notes (Signed)
   Subjective:    Patient ID: Cheyenne Guzman, female    DOB: 1965/03/24, 55 y.o.   MRN: [id number]  HPI Here for swelling, weight gain, and SOB. She has gained about 40 lbs since last June. She feels SOB with very little exertion, but she denies chest pain. No coughing. Her ECHO in Feb 2020 was normal with an EF of 55-60%.    Review of Systems  Constitutional: Negative.   Respiratory: Positive for shortness of breath. Negative for cough and wheezing.   Cardiovascular: Positive for leg swelling. Negative for chest pain and palpitations.       Objective:   Physical Exam Constitutional:      General: She is not in acute distress.    Appearance: She is obese.  Cardiovascular:     Rate and Rhythm: Normal rate and regular rhythm.     Pulses: Normal pulses.     Heart sounds: Normal heart sounds.  Pulmonary:     Effort: Pulmonary effort is normal. No respiratory distress.     Breath sounds: Normal breath sounds. No stridor. No wheezing, rhonchi or rales.  Musculoskeletal:     Right lower leg: Edema present.     Left lower leg: Edema present.  Neurological:     General: No focal deficit present.     Mental Status: She is alert and oriented to person, place, and time.           Assessment & Plan:  This is a morbidly obese patient with a hx of PE's who is now retaining a lot of fluid. She will start taking Lasix 40 mg BID. Get labs today including a BMET, BNP, d dimer, and thyroid panel. Recheck next week.  [name], MD

## 2020-02-20 LAB — BASIC METABOLIC PANEL
BUN: 17 mg/dL (ref 7–25)
CO2: 27 mmol/L (ref 20–32)
Calcium: 9.7 mg/dL (ref 8.6–10.4)
Chloride: 102 mmol/L (ref 98–110)
Creat: 0.92 mg/dL (ref 0.50–1.05)
Glucose, Bld: 101 mg/dL — ABNORMAL HIGH (ref 65–99)
Potassium: 4.4 mmol/L (ref 3.5–5.3)
Sodium: 140 mmol/L (ref 135–146)

## 2020-02-20 LAB — T4, FREE: Free T4: 1.2 ng/dL (ref 0.8–1.8)

## 2020-02-20 LAB — TSH: TSH: 3.45 mIU/L

## 2020-02-20 LAB — HEPATIC FUNCTION PANEL
AG Ratio: 1.4 (calc) (ref 1.0–2.5)
ALT: 16 U/L (ref 6–29)
AST: 20 U/L (ref 10–35)
Albumin: 4.2 g/dL (ref 3.6–5.1)
Alkaline phosphatase (APISO): 97 U/L (ref 37–153)
Bilirubin, Direct: 0.1 mg/dL (ref 0.0–0.2)
Globulin: 3 g/dL (calc) (ref 1.9–3.7)
Indirect Bilirubin: 0.4 mg/dL (calc) (ref 0.2–1.2)
Total Bilirubin: 0.5 mg/dL (ref 0.2–1.2)
Total Protein: 7.2 g/dL (ref 6.1–8.1)

## 2020-02-20 LAB — HEMOGLOBIN A1C
Hgb A1c MFr Bld: 9.2 % of total Hgb — ABNORMAL HIGH (ref <5.7)
Mean Plasma Glucose: 217 mg/dL
eAG (mmol/L): 12 mmol/L

## 2020-02-20 LAB — T3, FREE: T3, Free: 2.7 pg/mL (ref 2.3–4.2)

## 2020-02-20 LAB — BRAIN NATRIURETIC PEPTIDE: Brain Natriuretic Peptide: 18 pg/mL (ref <100)

## 2020-02-20 LAB — D-DIMER, QUANTITATIVE: D-Dimer, Quant: 0.32 mcg/mL FEU (ref <0.50)

## 2020-02-22 ENCOUNTER — Encounter: Payer: Self-pay | Admitting: Internal Medicine

## 2020-03-05 ENCOUNTER — Other Ambulatory Visit: Payer: Self-pay | Admitting: Internal Medicine

## 2020-03-10 DIAGNOSIS — Z79899 Other long term (current) drug therapy: Secondary | ICD-10-CM | POA: Diagnosis not present

## 2020-03-14 DIAGNOSIS — Z79899 Other long term (current) drug therapy: Secondary | ICD-10-CM | POA: Diagnosis not present

## 2020-03-14 DIAGNOSIS — M5442 Lumbago with sciatica, left side: Secondary | ICD-10-CM | POA: Diagnosis not present

## 2020-03-14 DIAGNOSIS — M5441 Lumbago with sciatica, right side: Secondary | ICD-10-CM | POA: Diagnosis not present

## 2020-03-14 DIAGNOSIS — M961 Postlaminectomy syndrome, not elsewhere classified: Secondary | ICD-10-CM | POA: Diagnosis not present

## 2020-03-14 DIAGNOSIS — G8929 Other chronic pain: Secondary | ICD-10-CM | POA: Diagnosis not present

## 2020-03-16 ENCOUNTER — Other Ambulatory Visit: Payer: Self-pay | Admitting: Family Medicine

## 2020-03-23 ENCOUNTER — Telehealth: Payer: Self-pay | Admitting: Family Medicine

## 2020-03-23 NOTE — Progress Notes (Signed)
  Chronic Care Management   Outreach Note  03/23/2020 Name: Cheyenne Guzman MRN: [id number] DOB: 1966/01/20  Referred by: Laurey Morale, MD Reason for referral : No chief complaint on file.   An unsuccessful telephone outreach was attempted today. The patient was referred to the pharmacist for assistance with care management and care coordination.   Follow Up Plan:   Carley Perdue [employer] Scheduler

## 2020-03-25 ENCOUNTER — Telehealth: Payer: Self-pay | Admitting: Family Medicine

## 2020-03-25 NOTE — Progress Notes (Signed)
  Chronic Care Management   Note  03/25/2020 Name: XOEY WARMOTH MRN: [id number] DOB: 1965-03-20  Cheyenne Guzman is a 55 y.o. year old female who is a primary care patient of Laurey Morale, MD. I reached out to Donaciano Eva by phone today in response to a referral sent by Ms. Roda Shutters Maddy's PCP, Laurey Morale, MD.   Ms. Freund was given information about Chronic Care Management services today including:  1. CCM service includes personalized support from designated clinical staff supervised by her physician, including individualized plan of care and coordination with other care providers 2. 24/7 contact phone numbers for assistance for urgent and routine care needs. 3. Service will only be billed when office clinical staff spend 20 minutes or more in a month to coordinate care. 4. Only one practitioner may furnish and bill the service in a calendar month. 5. The patient may stop CCM services at any time (effective at the end of the month) by phone call to the office staff.   Patient agreed to services and verbal consent obtained.   Follow up plan:   Carley Perdue [employer] Scheduler

## 2020-03-28 ENCOUNTER — Ambulatory Visit (INDEPENDENT_AMBULATORY_CARE_PROVIDER_SITE_OTHER): Payer: Medicare Other | Admitting: Internal Medicine

## 2020-03-28 ENCOUNTER — Encounter: Payer: Self-pay | Admitting: Internal Medicine

## 2020-03-28 VITALS — Ht 65.0 in | Wt >= 6400 oz

## 2020-03-28 DIAGNOSIS — R0602 Shortness of breath: Secondary | ICD-10-CM | POA: Diagnosis not present

## 2020-03-28 DIAGNOSIS — Z800 Family history of malignant neoplasm of digestive organs: Secondary | ICD-10-CM

## 2020-03-28 DIAGNOSIS — Z8379 Family history of other diseases of the digestive system: Secondary | ICD-10-CM | POA: Diagnosis not present

## 2020-03-28 DIAGNOSIS — R635 Abnormal weight gain: Secondary | ICD-10-CM

## 2020-03-28 NOTE — Patient Instructions (Addendum)
You have been scheduled for an appointment with at [facility], Dr Lyman Bishop on 04/05/20 at 8:30 am. Please arrive at 8:15 am for registration. Address: [address] [place], [place], [place] [address] Phone: [phone]  Once you have been cleared by cardiology, we will schedule you for colonoscopy in the hospital setting due to your family history of colon cancer.  If you are age 56 or younger, your body mass index should be between 19-25. Your Body mass index is 74.55 kg/m. If this is out of the aformentioned range listed, please consider follow up with your Primary Care Provider.   Due to recent changes in healthcare laws, you may see the results of your imaging and laboratory studies on MyChart before your provider has had a chance to review them.  We understand that in some cases there may be results that are confusing or concerning to you. Not all laboratory results come back in the same time frame and the provider may be waiting for multiple results in order to interpret others.  Please give [place] 48 hours in order for your provider to thoroughly review all the results before contacting the office for clarification of your results.

## 2020-03-28 NOTE — Progress Notes (Signed)
Patient ID: Cheyenne Guzman, female   DOB: 1965-04-25, 55 y.o.   MRN: [id number] HPI: Cheyenne Guzman is a 55 year old female with a past medical history of nonadenomatous colon polyps (inflammatory and hyperplastic), family history of colon cancer in her sister, family history of cirrhosis in her sister, history of breast cancer, hyperlipidemia, history of PE on Xarelto, obesity, lymphedema, chronic pain on buprenorphine patch who is seen in consult at the request of Dr. Sarajane Jews to evaluate patient for colon cancer screening.  She is here today with her daughter.  She reports that from a GI perspective she does have a history of constipation which she has associated with her pain patch.  This is treated and not a problem as long as she eats to Activia yogurt daily.  No ongoing issues with abdominal pain.  No blood in stool or melena.  No current upper GI or hepatobiliary complaint.  She does have issues with weight gain which she attributes to fluid and lymphedema.  She reports with this fluid she has gained about 50 pounds.  Recently Dr. Sarajane Jews increased her Lasix to 40 mg twice daily and with this she has noticed some weight loss but this medication is hard to take twice daily.  At times it is not convenient as she may have an appointment and also some urinary incontinence symptoms.  She does have issues with shortness of breath particularly with exertion.  She had PE diagnosed in 2020 and reports her breathing has not been perfectly normal since this time.  She does remain on Xarelto.  She had a colonoscopy by Dr. Michail Sermon on February 11, 2008.  This revealed a pedunculated 10 mm polyp removed by hot snare in the distal transverse colon.  A 4 mm sessile rectal polyp removed.  There was moderately congested and erythematous mucosa at the ileocecal valve which was biopsied.  Pathology from the ileocecal valve showed active inflammation without granulomas.  This inflammation was felt to be nonspecific with a differential  including Crohn's as well as NSAID related inflammation.  The transverse colon polyp was an inflammatory polyp without adenomatous change.  The rectal polyp was hyperplastic.  Past Medical History:  Diagnosis Date  . Anxiety    per pt. - mild   . Arthritis    back & ankles   . Asthma    seasonal   . Breast cancer ([place])   . Cancer [facility])    breast, sees Dr. Jana Hakim , Left - 11/2007  . Cellulitis 2020   severe - Left leg   . Complication of anesthesia 2013   severe muscle spasms-sore-no doc  . Depression   . Diabetes mellitus    sees Dr. Chalmers Cater, diagnosed 2003  . HA (headache)   . Hearing loss    Went to [facility]  . Hyperlipidemia   . Kidney infection    - hosp. [facility]- septic- 10/2009  . Low back pain   . Peripheral neuropathy    R sided numbness- face & jaw  . Pulmonary embolism ([place]) 2020  . Sepsis ([place]) 2020  . Sleep apnea 9/15   severe-just started cpap-doing well  . TIA (transient ischemic attack)    in 2007,2008, and 2011  . Tinnitus of right ear   . Urosepsis 08-2009   with Klebsiella    Past Surgical History:  Procedure Laterality Date  . ABDOMINAL HYSTERECTOMY  08/2004  . BREAST BIOPSY  06/22/2011   Procedure: BREAST BIOPSY;  Surgeon:  Stark Klein, MD;  Location: [place];  Service: General;  Laterality: Right;  . BREAST LUMPECTOMY    . BREAST SURGERY  11/2007   left ductal carcinoma in situ  . CARPAL TUNNEL RELEASE Right 10/13/2013   Procedure: RIGHT CARPAL TUNNEL RELEASE;  Surgeon: Hessie Dibble, MD;  Location: [place];  Service: Orthopedics;  Laterality: Right;  . CARPAL TUNNEL RELEASE Left 11/24/2013   Procedure: LEFT CARPAL TUNNEL RELEASE WITH CYST EXCISION ;  Surgeon: Hessie Dibble, MD;  Location: [place];  Service: Orthopedics;  Laterality: Left;  . EAR CYST EXCISION Left 11/24/2013   Procedure: CYST REMOVAL;  Surgeon: Hessie Dibble, MD;  Location: [place];   Service: Orthopedics;  Laterality: Left;  . FOOT SURGERY  2000   plantar fasciitis-left  . LUMBAR DISC SURGERY  03-29-14   Fusion, revision 04-27-14 L4-5 Cauda Equina with graft  . NERVE GRAFT  06-01-08   cadaver graft to right inferior alveolar nerve  in [place]    Outpatient Medications Prior to Visit  Medication Sig Dispense Refill  . albuterol (PROVENTIL) (2.5 MG/3ML) 0.083% nebulizer solution 1 VIAL IN NEBULIZER EVERY 4 HOURS AS NEEDED FOR WHEEZING (Patient taking differently: Take 2.5 mg by nebulization every 4 (four) hours as needed for wheezing.) 75 mL 1  . albuterol (VENTOLIN HFA) 108 (90 Base) MCG/ACT inhaler Inhale 2 puffs into the lungs every 4 (four) hours as needed for wheezing or shortness of breath. 18 g 5  . ALPRAZolam (XANAX) 1 MG tablet TAKE 1 TABLET BY MOUTH THREE TIMES A DAY (Patient taking differently: 3 (three) times daily as needed.) 90 tablet 5  . atorvastatin (LIPITOR) 10 MG tablet TAKE 1 TABLET BY MOUTH EVERYDAY AT BEDTIME 90 tablet 1  . Blood Glucose Monitoring Suppl (CONTOUR NEXT MONITOR) w/Device KIT 1 each by Does not apply route 2 (two) times daily. To check blood sugars twice a day. 1 kit 0  . Buprenorphine 15 MCG/HR PTWK Place 1 patch onto the skin every Saturday.   0  . furosemide (LASIX) 40 MG tablet Take 1 tablet (40 mg total) by mouth 2 (two) times daily. 60 tablet 3  . gabapentin (NEURONTIN) 300 MG capsule Take 300 mg by mouth 2 (two) times daily.     Marland Kitchen glucose blood (CONTOUR NEXT TEST) test strip Use to test blood sugar 2 times daily 100 each 12  . glucose blood test strip Use as instructed 100 each 0  . HUMALOG MIX 75/25 (75-25) 100 UNIT/ML SUSP injection INJECT 110 UNITS INTO THE SKIN 2 TIMES DAILY WITH A MEAL. 70 mL 2  . Insulin Syringe-Needle U-100 (INSULIN SYRINGE 1CC/31GX5/16") 31G X 5/16" 1 ML MISC 2x daily 150 each 6  . levothyroxine (SYNTHROID) 75 MCG tablet TAKE 1 TABLET BY MOUTH EVERY DAY 90 tablet 3  . lidocaine (XYLOCAINE) 5 % ointment  Apply to areas of pain up to bid.    . metFORMIN (GLUCOPHAGE) 1000 MG tablet Take 1 tablet (1,000 mg total) by mouth daily with breakfast. 90 tablet 3  . methocarbamol (ROBAXIN) 750 MG tablet Take 1 tablet (750 mg total) by mouth 2 (two) times daily. 60 tablet 5  . Multiple Vitamin (MULTIVITAMIN WITH MINERALS) TABS tablet Take 1 tablet by mouth daily.    . pramipexole (MIRAPEX) 0.5 MG tablet TAKE 1 TABLET BY MOUTH AT BEDTIME. (Patient taking differently: at bedtime as needed.) 90 tablet 0  . sertraline (ZOLOFT) 100 MG tablet TAKE 2  TABLETS (200 MG TOTAL) BY MOUTH AT BEDTIME. 180 tablet 3  . XARELTO 20 MG TABS tablet TAKE 1 TABLET BY MOUTH EVERY DAY 30 tablet 11  . Semaglutide,0.25 or 0.5MG/DOS, (OZEMPIC, 0.25 OR 0.5 MG/DOSE,) 2 MG/1.5ML SOPN Inject 0.375 mLs (0.5 mg total) into the skin once a week. (Patient not taking: Reported on 03/28/2020) 1 pen 6  . multivitamin (VIT W/EXTRA C) CHEW chewable tablet Chew 1 tablet by mouth daily.      No facility-administered medications prior to visit.    Allergies  Allergen Reactions  . Vicodin [Hydrocodone-Acetaminophen] Nausea And Vomiting    Family History  Problem Relation Age of Onset  . Cancer Mother        breast  . Cancer Father        lung  . Diabetes Father   . Cancer Sister 49       colon  . Cancer Maternal Aunt        breast - both  . Diabetes Sister   . Anesthesia problems Neg Hx     Social History   Tobacco Use  . Smoking status: Former Smoker    Quit date: 02/19/1992    Years since quitting: 28.1  . Smokeless tobacco: Never Used  Vaping Use  . Vaping Use: Never used  Substance Use Topics  . Alcohol use: Not Currently    Comment: rare  . Drug use: No    ROS: As per history of present illness, otherwise negative  Ht '5\' 5"'  (1.651 m)   Wt (!) 448 lb (203.2 kg)   BMI 74.55 kg/m  Constitutional: Well-developed and well-nourished. No distress. HEENT: Normocephalic and atraumatic.  No scleral icterus. Cardiovascular:  Normal rate, regular rhythm and intact distal pulses.  Pulmonary/chest: Effort normal and breath sounds normal. No wheezing, rales or rhonchi. Extremities: no clubbing, cyanosis, b/l lymphedema Neurological: Alert and oriented to person place and time. Skin: Skin is warm and dry.  Psychiatric: Normal mood and affect. Behavior is normal.  RELEVANT LABS AND IMAGING: CBC    Component Value Date/Time   WBC 6.3 04/09/2019 1327   RBC 5.65 (H) 04/09/2019 1327   HGB 13.9 04/09/2019 1327   HCT 46.0 04/09/2019 1327   PLT 167 04/09/2019 1327   MCV 81.4 04/09/2019 1327   MCH 24.6 (L) 04/09/2019 1327   MCHC 30.2 04/09/2019 1327   RDW 15.7 (H) 04/09/2019 1327   LYMPHSABS 1.3 08/02/2018 0323   MONOABS 0.6 08/02/2018 0323   EOSABS 0.2 08/02/2018 0323   BASOSABS 0.0 08/02/2018 0323    CMP     Component Value Date/Time   NA 140 02/19/2020 1611   NA 142 06/25/2013 1037   K 4.4 02/19/2020 1611   CL 102 02/19/2020 1611   CO2 27 02/19/2020 1611   GLUCOSE 101 (H) 02/19/2020 1611   BUN 17 02/19/2020 1611   BUN 14 06/25/2013 1037   CREATININE 0.92 02/19/2020 1611   CALCIUM 9.7 02/19/2020 1611   PROT 7.2 02/19/2020 1611   PROT 7.3 06/25/2013 1037   ALBUMIN 3.7 11/11/2018 2138   ALBUMIN 4.5 06/25/2013 1037   AST 20 02/19/2020 1611   ALT 16 02/19/2020 1611   ALKPHOS 86 11/11/2018 2138   BILITOT 0.5 02/19/2020 1611   GFRNONAA >60 04/09/2019 1327   GFRAA >60 04/09/2019 1327   CT ABDOMEN AND PELVIS WITH CONTRAST   TECHNIQUE: Multidetector CT imaging of the abdomen and pelvis was performed using the standard protocol following bolus administration of intravenous contrast.  CONTRAST:  169m OMNIPAQUE IOHEXOL 300 MG/ML  SOLN   COMPARISON:  07/12/2018   FINDINGS: Lower chest:  No contributory findings.   Hepatobiliary: No focal liver abnormality.No evidence of biliary obstruction or stone.   Pancreas: Unremarkable.   Spleen: Upper normal size without focal abnormality or  significant change since 2014.   Adrenals/Urinary Tract: Negative adrenals. No hydronephrosis or ureteral stone. 3 mm left renal calculus. Unremarkable bladder.   Stomach/Bowel: Fecalized distal small bowel contents without bowel wall thickening or obstruction. No appendicitis.   Vascular/Lymphatic: No acute vascular abnormality. Mild distal aortic atherosclerotic calcification. No mass or adenopathy.   Reproductive:Hysterectomy.   Other: No ascites or pneumoperitoneum. Subcutaneous reticulation in the midline abdominal wall and about the umbilicus, also seen to some degree in 2014, the upper possibly sequela of chronic medication injection.   Musculoskeletal: Degenerative hip spurring. L4-5 PLIF with solid arthrodesis. Adjacent degenerative facet spurring is bulky, as is facet spurring at L2-3 where there is prominent right foraminal stenosis.   IMPRESSION: 1. No acute finding. 2. Nonobstructive left renal calculus.     Electronically Signed   By: JMonte FantasiaM.D.   On: 11/12/2018 04:13  ASSESSMENT/PLAN: 55year old female with a past medical history of nonadenomatous colon polyps (inflammatory and hyperplastic), family history of colon cancer in her sister, family history of cirrhosis in her sister, history of breast cancer, hyperlipidemia, history of PE on Xarelto, obesity, lymphedema, chronic pain on buprenorphine patch who is seen in consult at the request of Dr. FSarajane Jewsto evaluate patient for colon cancer screening.  1.  Family history of colon cancer (sister < 66yrs ago) --the patient has had prior colonoscopy but did not have any evidence of adenomatous or sessile serrated colon polyps.  She is due for colonoscopy at this time for elevated risk/family history screening.  For her colonoscopy would need to be performed in the outpatient hospital setting and I am also concerned about the elevated risk of anesthesia and cardiopulmonary complications given her weight gain,  possible volume overload as well as dyspnea symptom.  We discussed this today at length and I would recommend that we have her evaluated by cardiology to exclude a heart failure component before we pursue colonoscopy in the outpatient hospital setting.  We would also need permission to hold Xarelto for 48 hours prior to colonoscopy.  Dr. FSarajane Jewscurrently manages her anticoagulation. --Cardiology evaluation to evaluate heart function and exclude CHF/volume overload as a cause for her dyspnea --Colonoscopy in the outpatient hospital setting with timing to be determined based on cardiology opinion  2.  Dyspnea/lower extremity edema --cardiology referral; she is currently on Lasix 40 mg twice daily but admits to not taking the second dose consistently  3.  Family history of NASH cirrhosis --no evidence for advanced liver disease.  She would be at risk for fatty liver disease based on her obesity and family history.  CT scan of the abdomen pelvis with contrast was performed in October 2020 which showed a normal liver without evidence for cirrhosis or steatosis at that time.     Cc:Fry, SIshmael Holter M[place]lin  [place] [id number]

## 2020-04-05 ENCOUNTER — Ambulatory Visit: Payer: Medicare Other | Admitting: Internal Medicine

## 2020-04-05 ENCOUNTER — Encounter: Payer: Self-pay | Admitting: Internal Medicine

## 2020-04-05 ENCOUNTER — Other Ambulatory Visit: Payer: Self-pay

## 2020-04-05 VITALS — BP 130/66 | HR 87 | Ht 67.0 in | Wt >= 6400 oz

## 2020-04-05 DIAGNOSIS — R0602 Shortness of breath: Secondary | ICD-10-CM | POA: Diagnosis not present

## 2020-04-05 DIAGNOSIS — G4733 Obstructive sleep apnea (adult) (pediatric): Secondary | ICD-10-CM | POA: Diagnosis not present

## 2020-04-05 DIAGNOSIS — I2782 Chronic pulmonary embolism: Secondary | ICD-10-CM

## 2020-04-05 DIAGNOSIS — Z6841 Body Mass Index (BMI) 40.0 and over, adult: Secondary | ICD-10-CM

## 2020-04-05 NOTE — Patient Instructions (Signed)
Medication Instructions:  Samples of Xarelto provided - you will need to take 1 - 15mg  tablet + 2 - 2.5mg  tablets every day to = 20mg   *If you need a refill on your cardiac medications before your next appointment, please call your pharmacy*  Testing/Procedures: Your physician has requested that you have an echocardiogram. Echocardiography is a painless test that uses sound waves to create images of your heart. It provides your doctor with information about the size and shape of your heart and how well your heart's chambers and valves are working. This procedure takes approximately one hour. There are no restrictions for this procedure.  Your physician has recommended that you have a sleep study. This test records several body functions during sleep, including: brain activity, eye movement, oxygen and carbon dioxide blood levels, heart rate and rhythm, breathing rate and rhythm, the flow of air through your mouth and nose, snoring, body muscle movements, and chest and belly movement. -- our office sleep coordinator will contact you about an appointment -- this test is done at [facility]   Follow-Up: At [facility], you and your health needs are our priority.  As part of our continuing mission to provide you with exceptional heart care, we have created designated Provider Care Teams.  These Care Teams include your primary Cardiologist (physician) and Advanced Practice Providers (APPs -  Physician Assistants and Nurse Practitioners) who all work together to provide you with the care you need, when you need it.  We recommend signing up for the patient portal called "MyChart".  Sign up information is provided on this After Visit Summary.  MyChart is used to connect with patients for Virtual Visits (Telemedicine).  Patients are able to view lab/test results, encounter notes, upcoming appointments, etc.  Non-urgent messages can be sent to your provider as well.   To learn more about what you  can do with MyChart, go to [email or web address].    Your next appointment:   2-3 months  The format for your next appointment:   In Person  Provider:   You may see Dr. Debara Pickett or one of the following Advanced Practice Providers on your designated Care Team:    Almyra Deforest, PA-C  Fabian Sharp, [place] or   Roby Lofts, [place]    Other Instructions

## 2020-04-05 NOTE — Progress Notes (Addendum)
OFFICE CONSULT NOTE  Chief Complaint:  Dyspnea  Primary Care Physician: Laurey Morale, MD  HPI:  Cheyenne Guzman is a 55 y.o. female who is being seen today for the evaluation of dyspnea at the request of Pyrtle, Lajuan Lines, MD.  This is a pleasant 55 year old female kindly referred for evaluation management of dyspnea by Dr. Hilarie Fredrickson.  He had taking care of her sister who died recently.  Unfortunately she is also lost several other family members and her father over the past several years and she was quite tearful at that today.  She has had persistent weight gain some of which she says is lower extremity edema.  Her PCP has been giving her Lasix and recently doubled the dose.  She reports progressive dyspnea which has been worse over the past several months.  Weight is up an additional 50 pounds now with a total weight of 446 pounds and a BMI close to 70.  She tells me she does have a diagnosis of obstructive sleep apnea but has been noncompliant with CPAP this was in 2015.  She also has a history of pulmonary emboli x2.  She has been on chronic Xarelto.  Her last CT scan which was a CT pulmonary angiogram in 2021 showed no evidence of any persistent pulmonary emboli.  There was no coronary calcium.  She denies any chest pain.  She reports her dyspnea is mostly with exertion.  She denies any significant orthopnea.  She is noted to snore and is told that she stops breathing by her daughter who is with her at the visit today.  She has difficulty ambulating.  She has chronic neuropathy.  She is wearing compression stockings.  She probably has venous hypertension as well.  PMHx:  Past Medical History:  Diagnosis Date  . Anxiety    per pt. - mild   . Arthritis    back & ankles   . Asthma    seasonal   . Breast cancer ([place])   . Cancer [facility])    breast, sees Dr. Jana Hakim , Left - 11/2007  . Cellulitis 2020   severe - Left leg   . Complication of anesthesia 2013   severe muscle spasms-sore-no doc  .  Depression   . Diabetes mellitus    sees Dr. Chalmers Cater, diagnosed 2003  . HA (headache)   . Hearing loss    Went to [employer] [facility] Throat,[facility]  . Hyperlipidemia   . Kidney infection    - hosp. [facility]- septic- 10/2009  . Low back pain   . Peripheral neuropathy    R sided numbness- face & jaw  . Pulmonary embolism ([place]) 2020  . Sepsis ([place]) 2020  . Sleep apnea 9/15   severe-just started cpap-doing well  . TIA (transient ischemic attack)    in 2007,2008, and 2011  . Tinnitus of right ear   . Urosepsis 08-2009   with Klebsiella    Past Surgical History:  Procedure Laterality Date  . ABDOMINAL HYSTERECTOMY  08/2004  . BREAST BIOPSY  06/22/2011   Procedure: BREAST BIOPSY;  Surgeon: Stark Klein, MD;  Location: [place];  Service: General;  Laterality: Right;  . BREAST LUMPECTOMY    . BREAST SURGERY  11/2007   left ductal carcinoma in situ  . CARPAL TUNNEL RELEASE Right 10/13/2013   Procedure: RIGHT CARPAL TUNNEL RELEASE;  Surgeon: Hessie Dibble, MD;  Location: [place];  Service: Orthopedics;  Laterality: Right;  .  CARPAL TUNNEL RELEASE Left 11/24/2013   Procedure: LEFT CARPAL TUNNEL RELEASE WITH CYST EXCISION ;  Surgeon: Hessie Dibble, MD;  Location: [place];  Service: Orthopedics;  Laterality: Left;  . EAR CYST EXCISION Left 11/24/2013   Procedure: CYST REMOVAL;  Surgeon: Hessie Dibble, MD;  Location: [place];  Service: Orthopedics;  Laterality: Left;  . FOOT SURGERY  2000   plantar fasciitis-left  . LUMBAR DISC SURGERY  03-29-14   Fusion, revision 04-27-14 L4-5 [place]a Equina with graft  . NERVE GRAFT  06-01-08   cadaver graft to right inferior alveolar nerve  in [place]    FAMHx:  Family History  Problem Relation Age of Onset  . Cancer Mother        breast  . Cancer Father        lung  . Diabetes Father   . Cancer Sister 79       colon  . Cancer Maternal Aunt        breast - both  . Diabetes  Sister   . Anesthesia problems Neg Hx     SOCHx:   reports that she quit smoking about 28 years ago. She has never used smokeless tobacco. She reports previous alcohol use. She reports that she does not use drugs.  ALLERGIES:  Allergies  Allergen Reactions  . Vicodin [Hydrocodone-Acetaminophen] Nausea And Vomiting    ROS: Pertinent items noted in HPI and remainder of comprehensive ROS otherwise negative.  HOME MEDS: Current Outpatient Medications on File Prior to Visit  Medication Sig Dispense Refill  . albuterol (PROVENTIL) (2.5 MG/3ML) 0.083% nebulizer solution 1 VIAL IN NEBULIZER EVERY 4 HOURS AS NEEDED FOR WHEEZING (Patient taking differently: Take 2.5 mg by nebulization every 4 (four) hours as needed for wheezing.) 75 mL 1  . albuterol (VENTOLIN HFA) 108 (90 Base) MCG/ACT inhaler Inhale 2 puffs into the lungs every 4 (four) hours as needed for wheezing or shortness of breath. 18 g 5  . ALPRAZolam (XANAX) 1 MG tablet TAKE 1 TABLET BY MOUTH THREE TIMES A DAY (Patient taking differently: 3 (three) times daily as needed.) 90 tablet 5  . atorvastatin (LIPITOR) 10 MG tablet TAKE 1 TABLET BY MOUTH EVERYDAY AT BEDTIME 90 tablet 1  . Blood Glucose Monitoring Suppl (CONTOUR NEXT MONITOR) w/Device KIT 1 each by Does not apply route 2 (two) times daily. To check blood sugars twice a day. 1 kit 0  . Buprenorphine 15 MCG/HR PTWK Place 1 patch onto the skin every Saturday.   0  . furosemide (LASIX) 40 MG tablet Take 1 tablet (40 mg total) by mouth 2 (two) times daily. 60 tablet 3  . gabapentin (NEURONTIN) 300 MG capsule Take 300 mg by mouth 2 (two) times daily.     Marland Kitchen glucose blood (CONTOUR NEXT TEST) test strip Use to test blood sugar 2 times daily 100 each 12  . glucose blood test strip Use as instructed 100 each 0  . HUMALOG MIX 75/25 (75-25) 100 UNIT/ML SUSP injection INJECT 110 UNITS INTO THE SKIN 2 TIMES DAILY WITH A MEAL. 70 mL 2  . Insulin Syringe-Needle U-100 (INSULIN SYRINGE  1CC/31GX5/16") 31G X 5/16" 1 ML MISC 2x daily 150 each 6  . levothyroxine (SYNTHROID) 75 MCG tablet TAKE 1 TABLET BY MOUTH EVERY DAY 90 tablet 3  . lidocaine (XYLOCAINE) 5 % ointment Apply to areas of pain up to bid.    . metFORMIN (GLUCOPHAGE) 1000 MG tablet Take 1 tablet (1,000  mg total) by mouth daily with breakfast. 90 tablet 3  . methocarbamol (ROBAXIN) 750 MG tablet Take 1 tablet (750 mg total) by mouth 2 (two) times daily. 60 tablet 5  . Multiple Vitamin (MULTIVITAMIN WITH MINERALS) TABS tablet Take 1 tablet by mouth daily.    . pramipexole (MIRAPEX) 0.5 MG tablet TAKE 1 TABLET BY MOUTH AT BEDTIME. (Patient taking differently: at bedtime as needed.) 90 tablet 0  . Semaglutide,0.25 or 0.5MG/DOS, (OZEMPIC, 0.25 OR 0.5 MG/DOSE,) 2 MG/1.5ML SOPN Inject 0.375 mLs (0.5 mg total) into the skin once a week. 1 pen 6  . sertraline (ZOLOFT) 100 MG tablet TAKE 2 TABLETS (200 MG TOTAL) BY MOUTH AT BEDTIME. 180 tablet 3  . XARELTO 20 MG TABS tablet TAKE 1 TABLET BY MOUTH EVERY DAY 30 tablet 11   No current facility-administered medications on file prior to visit.    LABS/IMAGING: No results found for this or any previous visit (from the past 48 hour(s)). No results found.  LIPID PANEL:    Component Value Date/Time   CHOL 219 (H) 11/05/2018 1611   TRIG 250.0 (H) 11/05/2018 1611   HDL 40.10 11/05/2018 1611   CHOLHDL 5 11/05/2018 1611   VLDL 50.0 (H) 11/05/2018 1611   LDLCALC 78 11/28/2012 0156   LDLDIRECT 151.0 11/05/2018 1611    WEIGHTS: Wt Readings from Last 3 Encounters:  04/05/20 (!) 446 lb 3.2 oz (202.4 kg)  03/28/20 (!) 448 lb (203.2 kg)  02/19/20 (!) 440 lb 6.4 oz (199.8 kg)    VITALS: BP 130/66   Pulse 87   Ht '5\' 7"'  (1.702 m)   Wt (!) 446 lb 3.2 oz (202.4 kg)   SpO2 98%   BMI 69.88 kg/m   EXAM: General appearance: alert, no distress and morbidly obese Neck: no carotid bruit, no JVD, thyroid not enlarged, symmetric, no tenderness/mass/nodules and Thick Lungs: diminished  breath sounds bilaterally Heart: regular rate and rhythm Abdomen: Overly obese, protuberant Extremities: edema Trace to 1+ pitting edema with bilateral compression stocking Pulses: Difficult to palpate Skin: Skin color, texture, turgor normal. No rashes or lesions Neurologic: Grossly normal Psych: Tearful  EKG: Normal sinus rhythm 87- personally reviewed  ASSESSMENT: 1. Dyspnea on exertion 2. Super morbid obesity 3. History of PE x2 on chronic Xarelto 4. OSA-noncompliant with CPAP  PLAN: 1.   Ms. Hustead has dyspnea on exertion which is likely related to pulmonary hypertension or possibly right heart dysfunction.  This is likely related to super morbid obesity, upper airway resistance and obstructive sleep apnea which is untreated.  She needs to be using CPAP or possibly BiPAP.  Since her study was in 2015 will arrange for a repeat sleep study and split-night titration.  She should remain on lifelong Xarelto.  Her last CT a year ago showed resolution of her clots.  She is on Lasix with minimal improvement in her dyspnea.  I like to get an echocardiogram to evaluate her right heart, LV function and for pulmonary hypertension.  There was no coronary calcification on her CT and I suspect that coronary artery disease is an unlikely cause of her shortness of breath.  We will plan to follow-up with her to review the studies.  Thanks for the kind referral.  Pixie Casino, MD, FACC, [place] Director of the [facility] Lipid Disorders &  Cardiovascular Risk Reduction Clinic Diplomate of the American Board of Clinical Lipidology Attending Cardiologist  Direct Dial: [phone]  Fax: [phone]  Website:  [email or web address] Baumbach 04/05/2020, 9:55 AM

## 2020-04-06 ENCOUNTER — Telehealth: Payer: Self-pay | Admitting: *Deleted

## 2020-04-06 NOTE — Telephone Encounter (Signed)
Patient notified of sleep study appointment details.

## 2020-04-08 ENCOUNTER — Telehealth: Payer: Self-pay | Admitting: Licensed Clinical Social Worker

## 2020-04-08 NOTE — Progress Notes (Signed)
Heart and Vascular Care Navigation  04/08/2020  Cheyenne Guzman [birth year] [id number]  Reason for Referral:  General check in; medication affordability difficulties                                                                                                    Assessment:                                     LCSW called and reached pt at [phone]. Introduced self, role, reason for call. Pt confirmed home address, PCP, and emergency contacts. She lives with her husband and 51yo daughter. They receive and live off of social security and long term disability payments. Unfortunately some of the costs associated w/ ongoing medical care and medications add up by the end of the year. Confirmed she has [facility] Medicare coverage only at this time. She has the Xarelto application but has not yet looked over it as she had a death in the family and her daughters birthday is today so she has had limited time. LCSW shared that I could assist her with that application as needed once she starts working on it. I also assisted pt with completing and submitting Extra Help application. Pt aware from screening tool she may not be eligible but decided to complete application anyway at this time. LCSW will mail her information and let her know that it may take 3-4 weeks to process. We went over her other current costs around housing and utilities, no current turn off notices but pt is interested in referral to [facility] for Housing and Community Studies who can connect her with any utility assistance programs she may be eligible for at this time. Pt shared her appreciation for call and will f/u as needed.   HRT/VAS Care Coordination    Patients Home Cardiology Office [place] Team Social Worker   Social Worker Name: Margarito Liner [place], [phone]   Living arrangements for the past 2 months Single Family Home   Lives with: Self; Minor Children; Spouse   Patient  Current Insurance Coverage Managed Medicare   Patient Has Concern With Paying Medical Bills Yes   Patient Concerns With Medical Bills some prescription copays;  ongoing medical care   Medical Bill Referrals: Extra Help for prescriptions costs   Patient Prescription Assistance Programs Other   Other Assistance Programs Medications Extra Help application completed 7/01   Home Assistive Devices/Equipment Cane (specify quad or straight)  straight   DME Agency NA   [place] Agency NA      Social History:  SDOH Screenings   Alcohol Screen: Low Risk   . Last Alcohol Screening Score (AUDIT): 0  Depression (PHQ2-9): Medium Risk  . PHQ-2 Score: 10  Financial Resource Strain: High Risk  . Difficulty of Paying Living Expenses: Hard  Food Insecurity: Food Insecurity Present  . Worried About Charity fundraiser in the Last Year: Sometimes true  . Ran Out of Food in the Last Year: Sometimes true  Housing: Low Risk   . Last Housing Risk Score: 0  Physical Activity: Inactive  . Days of Exercise per Week: 0 days  . Minutes of Exercise per Session: 0 min  Social Connections: Moderately Integrated  . Frequency of Communication with Friends and Family: More than three times a week  . Frequency of Social Gatherings with Friends and Family: More than three times a week  . Attends Religious Services: More than 4 times per year  . Active Member of Clubs or Organizations: No  . Attends Archivist Meetings: Never  . Marital Status: Married  Stress: Stress Concern Present  . Feeling of Stress : To some extent  Tobacco Use: Medium Risk  . Smoking Tobacco Use: Former Smoker  . Smokeless Tobacco Use: Never Used  Transportation Needs: No Transportation Needs  . Lack of Transportation (Medical): No  . Lack of Transportation (Non-Medical): No    SDOH Interventions: Financial Resources:  Financial Strain Interventions: Other  (Comment) (referral to [facility] for Housing and [employer]; Extra Help application submitted.)   Housing Insecurity:  Housing Interventions: Other (Comment),[id number] Referral (referral to [facility] for Housing and [employer]; Extra Help application submitted.)   Other Care Navigation Interventions:     Provided Pharmacy assistance resources Other- Extra Help application   Follow-up plan:   LCSW has mailed Extra Help information for reference, I also securely made referral through [facility] for [facility] for Housing and Community Studies. LCSW will f/u w/ pt as needed moving forward.

## 2020-04-11 ENCOUNTER — Ambulatory Visit: Payer: Medicare Other | Admitting: Internal Medicine

## 2020-04-22 ENCOUNTER — Encounter: Payer: Medicare Other | Admitting: Internal Medicine

## 2020-04-26 ENCOUNTER — Other Ambulatory Visit: Payer: Self-pay | Admitting: Endocrinology

## 2020-04-26 DIAGNOSIS — E785 Hyperlipidemia, unspecified: Secondary | ICD-10-CM

## 2020-05-02 ENCOUNTER — Ambulatory Visit (HOSPITAL_COMMUNITY): Payer: Medicare Other | Attending: Cardiology

## 2020-05-02 ENCOUNTER — Other Ambulatory Visit: Payer: Self-pay

## 2020-05-02 DIAGNOSIS — I2782 Chronic pulmonary embolism: Secondary | ICD-10-CM | POA: Insufficient documentation

## 2020-05-02 DIAGNOSIS — R0602 Shortness of breath: Secondary | ICD-10-CM | POA: Diagnosis not present

## 2020-05-02 DIAGNOSIS — E1169 Type 2 diabetes mellitus with other specified complication: Secondary | ICD-10-CM

## 2020-05-02 LAB — ECHOCARDIOGRAM COMPLETE
Area-P 1/2: 2.99 cm2
S' Lateral: 3.7 cm

## 2020-05-02 MED ORDER — PERFLUTREN LIPID MICROSPHERE
3.0000 mL | INTRAVENOUS | Status: AC | PRN
Start: 2020-05-02 — End: 2020-05-02
  Administered 2020-05-02: 3 mL via INTRAVENOUS

## 2020-05-02 NOTE — Telephone Encounter (Signed)
Called patient in regards to MyChart message referring to echo report. She read thru Dr. Claris Gladden echocardiogram report and was concerned about the findings. Explained Dr. Lysbeth Penner interpretation of the results as relayed to the patient:  Your heart pumping function (EF) is normal. There is impaired relaxation of the heart muscle (more stiff) which could contribute to your shortness of breath.   Please reach out via MyChart or call [phone] if you have any questions/concerns.   Thank you,  Eliezer Lofts, RN  She would like to know if she is labeled as having heart failure, what can be done to manage this. She still reports shortness of breath (not worse than at last visit). She has sleep study in May and follow up with MD in June  Notified patient I would send a message to Dr. Debara Pickett to address her concerns

## 2020-05-03 ENCOUNTER — Telehealth: Payer: Self-pay | Admitting: Licensed Clinical Social Worker

## 2020-05-03 NOTE — Progress Notes (Signed)
F/u call placed to pt today at [phone]. Pt has not yet received mail from [employer] regarding Extra Help determination. She shares it has been a busy few weeks as she has been cleaning out her parents old home in order for it to be placed on the market and hasnt had time to sit down with the Xarelto application, inquired if it would be helpful for this writer to assist w/ completion and mark where pt needed to review, provide information and sign. Pt agreeable to this, application with notations placed in the mail and pt aware she can bring it by [employer] office, mail or fax back to this Probation officer. I have placed my card also in mail for pt.   Will follow up in 1-2 weeks if pt does not return application before then.   Westley Hummer, MSW, [place]  [phone]

## 2020-05-11 ENCOUNTER — Telehealth: Payer: Self-pay | Admitting: Licensed Clinical Social Worker
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
## 2020-05-13 ENCOUNTER — Telehealth: Payer: Self-pay | Admitting: Pharmacist

## 2020-05-13 NOTE — Chronic Care Management (AMB) (Signed)
Chronic Care Management Pharmacy Assistant   Name: Cheyenne Guzman  MRN: [id number] DOB: 15-Aug-1965  Reason for Encounter: Chart Prep for CPP Visit for 05/17/2020   Conditions to be addressed/monitored: HLD, DMII and Hypthroidism  Primary concerns for visit include: DMII and Medication management   Recent office visits:   . 01.28.2022 Laurey Morale, MD Family Medicine Furosemide 40 mg Oral 2 times daily 01.21.2022 Medicare Wellness Visit o Medication discontinued   Recent consult visits:  . 03.15.2022 Pixie Casino, MD Cardiology . 03.07.2022Pyrtle, Jay M, MD Gastroenterology . 02.21.2021 Flynt, Gwinda Passe, NP Pain Management o Medication Prescriptions o Buprenorphine (BUTRANS) 15 mcg/hour patch place one patch onto skin every seven days for 28 days-4 patch o Gabapentin (NEURONTIN) 300 MG capsule take one capsule by mouth three times a day-90 . 12.01.2021 Flynt, Gwinda Passe, NP Pain Management o Medication Prescriptions o Methocarbamol (ROBAXIN) 750 MG tablet take one tablet by mouth three times a day -90 tablets o Buprenorphine (BUTRANS) 15 mcg/hour patch place patch onto skin every seven days for 28 days- 4 patches o Lidocaine (XYLOCAINE) 5 % ointment apply to area of pain up to twice a day 50g  Hospital visits:  None in previous 6 months   Patient Questions:   1. Have you seen any other providers since your last visit?  a. Cardiology- Hilty, Nadean Corwin, MD b. Gastroenterology -Hilarie Fredrickson Lajuan Lines, MD 2. Any changes in your medications or health? No 3. Any side effects from any medications? No 4. Do you have an symptoms or problems not managed by your medications?  a. Continuous body aches b. Sometimes swelling in her feet 5. Any concerns about your health right now? No 6. Has your provider asked that you check blood pressure, blood sugar, or follow special diet at home?  a. she checks her blood sugar daily 7. Do you get any type of exercise on a regular  basis?  a. she uses her stationary bike twice a day 8. Can you think of a goal you would like to reach for your health?  a. she would like to be able to do more exercises b. start swimming again 9. Do you have any problems getting your medications?  a. She's having some difficulties affording her medication. 10. Is there anything that you would like to discuss during the appointment? No  The patient was asked to please bring medications, blood pressure/ blood sugar log, and supplements to her appointment. Medication Dispensed  Quantity  atorvastatin (LIPITOR) 10 mg one tablet daily 04.06.2022 90  pramipexole (MIRAPEX) 0.5 MG patient taking at bedtime as needed 03.19.2022 90  HUMALOG MIX 75/25 (75-25) 100 UNIT/ML SUSP injection inject 110 units into skin two times a day with meals 03.03.2022 90  furosemide (LASIX) 40 mg one tablet by mouth two times a day 01.28.2022 90  lidocaine (XYLOCAINE) 5 % ointment apply to  areas of pain up to twice a day 12.31.2021 50 g  XARELTO 20 mg: one tablet daily    levothyroxine (SYNTHROID) 75 mcg: one tablet daily 04.19.2022 90  sertraline (ZOLOFT) 100 MG tablet take two tablets by mouth at bedtime 04.19.2022 90  Insulin Syringe-Needle U-100 (INSULIN SYRINGE 1CC/31GX5/16") 31G X 5/16" 1 ML MISC    Metformin (GLUCOPHAGE) 1000 mg take one tablet by mouth with breakfast 02.04.2022 90  Semaglutide,0.25 or 0.5MG/DOS, (OZEMPIC, 0.25 OR 0.5 MG/DOSE,) 2 MG/1.5ML SOPN in [place] 0.375 ML's inject once a week    albuterol (VENTOLIN HFA) 108 (90 Base)  MCG/ACT inhaler: inhale 2 puffs and two lungs every four hours as needed for wheezing or shortness of breath 11.13.2020 8.5 g  ALPRAZolam (XANAX) 1 mg : patient taking one tablet by mouth three times daily as needed 02.13.2020 90  Buprenorphine 15 MCG/HR PTWK place one patch onto skin every 04.05.2022 4  methocarbamol (ROBAXIN) 750 MG tablet take one tablet by mouth twice 03.26.2022 90  albuterol (PROVENTIL) (2.5 MG/3ML)  0.083% nebulizer solution one violin nebuliser every four hours as needed for period    gabapentin (NEURONTIN) 300 MG capsule one tablet by mouse two times a day 03.20.2022 90  Multiple Vitamin; one tablet daily    glucose blood (CONTOUR NEXT TEST) test strip-check blood sugar twice a day    Blood Glucose Monitoring: used to check blood sugar a day        Medications: Outpatient Encounter Medications as of 05/13/2020  Medication Sig  . albuterol (PROVENTIL) (2.5 MG/3ML) 0.083% nebulizer solution 1 VIAL IN NEBULIZER EVERY 4 HOURS AS NEEDED FOR WHEEZING (Patient taking differently: Take 2.5 mg by nebulization every 4 (four) hours as needed for wheezing.)  . albuterol (VENTOLIN HFA) 108 (90 Base) MCG/ACT inhaler Inhale 2 puffs into the lungs every 4 (four) hours as needed for wheezing or shortness of breath.  . ALPRAZolam (XANAX) 1 MG tablet TAKE 1 TABLET BY MOUTH THREE TIMES A DAY (Patient taking differently: 3 (three) times daily as needed.)  . atorvastatin (LIPITOR) 10 MG tablet Take 1 tablet (10 mg total) by mouth daily.  . Blood Glucose Monitoring Suppl (CONTOUR NEXT MONITOR) w/Device KIT 1 each by Does not apply route 2 (two) times daily. To check blood sugars twice a day.  . Buprenorphine 15 MCG/HR PTWK Place 1 patch onto the skin every Saturday.   . furosemide (LASIX) 40 MG tablet Take 1 tablet (40 mg total) by mouth 2 (two) times daily.  Marland Kitchen gabapentin (NEURONTIN) 300 MG capsule Take 300 mg by mouth 2 (two) times daily.   Marland Kitchen glucose blood (CONTOUR NEXT TEST) test strip Use to test blood sugar 2 times daily  . glucose blood test strip Use as instructed  . HUMALOG MIX 75/25 (75-25) 100 UNIT/ML SUSP injection INJECT 110 UNITS INTO THE SKIN 2 TIMES DAILY WITH A MEAL.  Marland Kitchen Insulin Syringe-Needle U-100 (INSULIN SYRINGE 1CC/31GX5/16") 31G X 5/16" 1 ML MISC 2x daily  . levothyroxine (SYNTHROID) 75 MCG tablet TAKE 1 TABLET BY MOUTH EVERY DAY  . lidocaine (XYLOCAINE) 5 % ointment Apply to areas of  pain up to bid.  . metFORMIN (GLUCOPHAGE) 1000 MG tablet Take 1 tablet (1,000 mg total) by mouth daily with breakfast.  . methocarbamol (ROBAXIN) 750 MG tablet Take 1 tablet (750 mg total) by mouth 2 (two) times daily.  . Multiple Vitamin (MULTIVITAMIN WITH MINERALS) TABS tablet Take 1 tablet by mouth daily.  . pramipexole (MIRAPEX) 0.5 MG tablet TAKE 1 TABLET BY MOUTH AT BEDTIME. (Patient taking differently: at bedtime as needed.)  . Semaglutide,0.25 or 0.5MG/DOS, (OZEMPIC, 0.25 OR 0.5 MG/DOSE,) 2 MG/1.5ML SOPN Inject 0.375 mLs (0.5 mg total) into the skin once a week.  . sertraline (ZOLOFT) 100 MG tablet TAKE 2 TABLETS (200 MG TOTAL) BY MOUTH AT BEDTIME.  Marland Kitchen XARELTO 20 MG TABS tablet TAKE 1 TABLET BY MOUTH EVERY DAY   No facility-administered encounter medications on file as of 05/13/2020.   Patient confirmed appointment for  05/17/2020  Star Rating Drugs:  Dispensed Quantity Pharmacy  Metformin [facility]2022 [facility]  [facility]atin [facility]06[facility]22 [facility]   Maia Breslow, [place] [phone]

## 2020-05-16 ENCOUNTER — Other Ambulatory Visit: Payer: Self-pay | Admitting: Family Medicine

## 2020-05-17 ENCOUNTER — Telehealth: Payer: Self-pay | Admitting: Licensed Clinical Social Worker

## 2020-05-17 ENCOUNTER — Ambulatory Visit (INDEPENDENT_AMBULATORY_CARE_PROVIDER_SITE_OTHER): Payer: Medicare Other | Admitting: Pharmacist

## 2020-05-17 DIAGNOSIS — E785 Hyperlipidemia, unspecified: Secondary | ICD-10-CM | POA: Diagnosis not present

## 2020-05-17 DIAGNOSIS — E039 Hypothyroidism, unspecified: Secondary | ICD-10-CM | POA: Diagnosis not present

## 2020-05-17 DIAGNOSIS — E1169 Type 2 diabetes mellitus with other specified complication: Secondary | ICD-10-CM | POA: Diagnosis not present

## 2020-05-17 DIAGNOSIS — E1165 Type 2 diabetes mellitus with hyperglycemia: Secondary | ICD-10-CM | POA: Diagnosis not present

## 2020-05-17 DIAGNOSIS — Z794 Long term (current) use of insulin: Secondary | ICD-10-CM | POA: Diagnosis not present

## 2020-05-17 NOTE — Patient Instructions (Addendum)
Hi Cheyenne Guzman,  It was great to get to meet you over the telephone! Below is a summary of some of the topics we discussed.   Just as a reminder, don't forget to look into getting your shingles and tetanus vaccines at the pharmacy.   Please reach out to me if you have any questions or need anything before our follow up!  Best, Maddie  Jeni Salles, PharmD, [facility] [place] [phone]  Visit Information  Goals Addressed            This Visit's Progress   . Monitor and Manage My Blood Sugar-Diabetes Type 2       Timeframe:  Short-Term Goal Priority:  High Start Date:                             Expected End Date:                       Follow Up Date 07/17/20/   - check blood sugar at prescribed times - check blood sugar if I feel it is too high or too low - take the blood sugar log to all doctor visits    Why is this important?    Checking your blood sugar at home helps to keep it from getting very high or very low.   Writing the results in a diary or log helps the doctor know how to care for you.   Your blood sugar log should have the time, date and the results.   Also, write down the amount of insulin or other medicine that you take.   Other information, like what you ate, exercise done and how you were feeling, will also be helpful.     Notes:       Patient Care Plan: CCM Pharmacy Care Plan    Problem Identified: Problem: Hyperlipidemia, Diabetes, Asthma, Hypothyroidism, Depression, Anxiety and Neuropathy and Chronic pain     Long-Range Goal: Patient-Specific Goal   Start Date: 05/17/2020  Expected End Date: 05/17/2021  This Visit's Progress: On track  Priority: High  Note:   Current Barriers:  . Unable to independently monitor therapeutic efficacy . Unable to achieve control of diabetes   Pharmacist Clinical Goal(s):  Marland Kitchen Patient will achieve adherence to monitoring guidelines and medication adherence to achieve  therapeutic efficacy . achieve control of diabetes as evidenced by A1c  through collaboration with PharmD and provider.   Interventions: . 1:1 collaboration with Laurey Morale, MD regarding development and update of comprehensive plan of care as evidenced by provider attestation and co-signature . Inter-disciplinary care team collaboration (see longitudinal plan of care) . Comprehensive medication review performed; medication list updated in electronic medical record  Hyperlipidemia: (LDL goal < 100) -Uncontrolled -Current treatment: . Atorvastatin 10 mg 1 tablet daily -Medications previously tried: statins (muscle pain with other statins) -Current dietary patterns: does not eat fried foods and limits take out -Current exercise habits: using cubii twice daily for 30 minutes and increasing time spent -Educated on Cholesterol goals;  Benefits of statin for ASCVD risk reduction; Importance of limiting foods high in cholesterol; -Counseled on diet and exercise extensively Recommended to continue current medication Recommended repeat lipid panel  Diabetes (A1c goal <7%) -Uncontrolled -Current medications: . Humalog mix 75/25 inject 110 units twice daily 120 units and 130 units in evening . Metformin 1000 mg daily . Ozempic 0.5 mg inject once weekly -  not started -Medications previously tried: Lantus, Humalog  -Current home glucose readings . fasting glucose: variable . post prandial glucose: variable -Denies hypoglycemic/hyperglycemic symptoms -Current meal patterns:  . breakfast: 2 yogurts and fruit or bananas and rice cake with PB . lunch: salad (garden mix, crunchy toppings, pickled beets, ham, boiled egg), lite raspberry vinegrette; keto sliced bread with ham and lettuce tomatoes . dinner: cauliflower, grilled foods (does not fry foods), [place] burgers in air fryer . snacks: pita chips with hummus or salsa . drinks: did not discuss -Current exercise: cubii twice daily for 30  minutes -Educated on A1c and blood sugar goals; Exercise goal of 150 minutes per week; Benefits of routine self-monitoring of blood sugar; Carbohydrate counting and/or plate method -Counseled to check feet daily and get yearly eye exams -Counseled on diet and exercise extensively Recommended to continue current medication Assessed patient finances. Will apply for assistance for Ozempic in order to start.  Asthma (Goal: control symptoms) -Controlled -Current treatment  . Albuterol HFA 1 puff as needed . Albuterol nebulizer 1 vial as needed -Medications previously tried: none  -Patient denies consistent use of maintenance inhaler -Frequency of rescue inhaler use: during the spring and fall -Counseled on When to use rescue inhaler -Recommended to continue current medication  Depression/Anxiety (Goal: minimize symptoms) -Controlled -Current treatment: . Alprazolam 1 mg 1 tablet three times daily as needed . Sertraline 100 mg 2 tablets daily -Medications previously tried/failed: none -PHQ9: 10 -GAD7: n/a -Educated on Benefits of medication for symptom control -Recommended to continue current medication  Hypothyroidism (Goal: TSH 0.35-4.5) -Controlled -Current treatment  . Levothyroxine 75 mcg 1 tablet daily -Medications previously tried: none  -Recommended to continue current medication Counseled on taking on an empty stomach and separating from multivitamin  Edema (Goal: minimize swelling) -Uncontrolled -Current treatment  . Furosemide 40 mg 1 tablet twice daily -Medications previously tried: none  -Recommended to continue current medication Counseled on limiting salt intake  Chronic pain (Goal: minimize symptoms) -Controlled -Current treatment  . Lidocaine 5% ointment . Butrans 15 mcg/hr patch 1 patch weekly . Methocarbamol 750 mg 1 tablet twice daily -Medications previously tried: Cyclobenzaprine (drowsy) -Recommended to continue current medication  Neuropathy  (Goal: minimize symptoms) -Controlled -Current treatment  . Gabapentin 300 mg 1 capsule twice daily -Medications previously tried: none  -Recommended to continue current medication  Restless legs syndrome  (Goal: minimize symptoms) -Controlled -Current treatment  . Pramipexole 0.5 mg 1 tablet at bedtime as needed -Medications previously tried: none  -Recommended to continue current medication Counseled on limiting caffeine intake  History of PE (Goal: prevent future PE) -Controlled -Current treatment  . Xarelto 20 mg 1 tablet daily with food -Medications previously tried: none  -Recommended to continue current medication Assessed patient finances. Will apply for patient assistance for Xarelto.  Health Maintenance -Vaccine gaps: shingles, tetanus -Current therapy:  . Multivitamin 1 tablet daily . Airborne daily . Vitamin D 1000 units 1 tablet daily -Educated on Cost vs benefit of each product must be carefully weighed by individual consumer -Patient is satisfied with current therapy and denies issues -Recommended to continue current medication  Patient Goals/Self-Care Activities . Patient will:  - check glucose daily, document, and provide at future appointments target a minimum of 150 minutes of moderate intensity exercise weekly  Follow Up Plan: Telephone follow up appointment with care management team member scheduled for:      Ms. Hartin was given information about Chronic Care Management services today including:  1. CCM service includes  personalized support from designated clinical staff supervised by her physician, including individualized plan of care and coordination with other care providers 2. 24/7 contact phone numbers for assistance for urgent and routine care needs. 3. Standard insurance, coinsurance, copays and deductibles apply for chronic care management only during months in which we provide at least 20 minutes of these services. Most insurances cover  these services at 100%, however patients may be responsible for any copay, coinsurance and/or deductible if applicable. This service may help you avoid the need for more expensive face-to-face services. 4. Only one practitioner may furnish and bill the service in a calendar month. 5. The patient may stop CCM services at any time (effective at the end of the month) by phone call to the office staff.  Patient agreed to services and verbal consent obtained.   The patient verbalized understanding of instructions, educational materials, and care plan provided today and agreed to receive a mailed copy of patient instructions, educational materials, and care plan.  Telephone follow up appointment with pharmacy team member scheduled for:  Viona Gilmore, [facility]

## 2020-05-17 NOTE — Telephone Encounter (Signed)
F/u call made to pt for second Xarelto assistance application that was mailed 4/20. Pt not sure if she has received it yet. Encouraged her to look around and if she does not find it then to give me a call and we can locate an alternate means of getting it to her to complete.  Westley Hummer, MSW, [place]  [phone]

## 2020-05-17 NOTE — Progress Notes (Signed)
Chronic Care Management Pharmacy Note  05/17/2020 Name:  Cheyenne Guzman MRN:  [id number] DOB:  1965-11-30  Subjective: Cheyenne Guzman is an 55 y.o. year old female who is a primary patient of Laurey Morale, MD.  The CCM team was consulted for assistance with disease management and care coordination needs.    Engaged with patient by telephone for initial visit in response to provider referral for pharmacy case management and/or care coordination services.   Consent to Services:  The patient was given the following information about Chronic Care Management services today, agreed to services, and gave verbal consent: 1. CCM service includes personalized support from designated clinical staff supervised by the primary care provider, including individualized plan of care and coordination with other care providers 2. 24/7 contact phone numbers for assistance for urgent and routine care needs. 3. Service will only be billed when office clinical staff spend 20 minutes or more in a month to coordinate care. 4. Only one practitioner may furnish and bill the service in a calendar month. 5.The patient may stop CCM services at any time (effective at the end of the month) by phone call to the office staff. 6. The patient will be responsible for cost sharing (co-pay) of up to 20% of the service fee (after annual deductible is met). Patient agreed to services and consent obtained.  Patient Care Team: Laurey Morale, MD as PCP - General Izora Gala, MD as Consulting Physician (Otolaryngology) Keene Breath., MD (Ophthalmology) Viona Gilmore, [facility] as Pharmacist (Pharmacist)  Recent office visits:  01.28.2022 Laurey Morale, MD Family Medicine: Patient presented for fluid retention. Increased furosemide to 40 mg Oral 2 times daily.   01.21.2022 Medicare Wellness Visit: Completed course of cipro, Macrobid and senna.  Recent consult visits:  03.15.2022 Pixie Casino, MD Cardiology: Patient presented  for SOB. Placed ordered for echo.   03.07.2022 Pyrtle, Lajuan Lines, MD Gastroenterology: Patient presented for family history of colon cancer. Recommended repeat colonoscopy and referred to cardiology.   02.21.2021 Flynt, Gwinda Passe, NP Pain Management: Patient presented for back pain and joint pain. Refilled Buprenorphine (BUTRANS) 15 mcg/hour patch place one patch onto skin every seven days for 28 days and Gabapentin (NEURONTIN) 300 MG capsule take one capsule by mouth three times a day.   12.01.2021 Flynt, Gwinda Passe, NP Pain Management: Patient presented for back pain and joint pain. Refilled ethocarbamol (ROBAXIN) 750 MG tablet take one tablet by mouth three times a day and buprenorphine (BUTRANS) 15 mcg/hour patch place patch onto skin every seven days for 28 days and lidocaine (XYLOCAINE) 5 % ointment apply to area of pain up to twice a day 50g.   Hospital visits: None in previous 6 months  Objective:  Lab Results  Component Value Date   CREATININE 0.92 02/19/2020   BUN 17 02/19/2020   GFR 56.49 (L) 11/05/2018   GFRNONAA >60 04/09/2019   GFRAA >60 04/09/2019   NA 140 02/19/2020   K 4.4 02/19/2020   CALCIUM 9.7 02/19/2020   CO2 27 02/19/2020   GLUCOSE 101 (H) 02/19/2020    Lab Results  Component Value Date/Time   HGBA1C 9.2 (H) 02/19/2020 04:11 PM   HGBA1C 10.1 (A) 06/19/2019 02:50 PM   HGBA1C 8.7 (A) 11/05/2018 04:13 PM   HGBA1C 8.4 (H) 07/31/2018 04:55 AM   FRUCTOSAMINE 327 (H) 06/21/2015 10:40 AM   GFR 56.49 (L) 11/05/2018 04:11 PM   GFR 72.54 05/22/2017 02:22 PM   MICROALBUR 15.7 (H)  11/05/2018 04:11 PM   MICROALBUR 1.5 04/14/2009 11:39 AM    Last diabetic Eye exam: No results found for: HMDIABEYEEXA  Last diabetic Foot exam: No results found for: HMDIABFOOTEX   Lab Results  Component Value Date   CHOL 219 (H) 11/05/2018   HDL 40.10 11/05/2018   LDLCALC 78 11/28/2012   LDLDIRECT 151.0 11/05/2018   TRIG 250.0 (H) 11/05/2018   CHOLHDL 5 11/05/2018     Hepatic Function Latest Ref Rng & Units 02/19/2020 11/11/2018 07/30/2018  Total Protein 6.1 - 8.1 g/dL 7.2 7.7 7.8  Albumin 3.5 - 5.0 g/dL - 3.7 3.7  AST 10 - 35 U/L '20 16 21  ' ALT 6 - 29 U/L '16 15 15  ' Alk Phosphatase 38 - 126 U/L - 86 98  Total Bilirubin 0.2 - 1.2 mg/dL 0.5 0.4 0.4  Bilirubin, Direct 0.0 - 0.2 mg/dL 0.1 - -    Lab Results  Component Value Date/Time   TSH 3.45 02/19/2020 04:11 PM   TSH 2.57 11/05/2018 04:11 PM   FREET4 1.2 02/19/2020 04:11 PM   FREET4 0.91 11/05/2018 04:11 PM    CBC Latest Ref Rng & Units 04/09/2019 11/11/2018 08/02/2018  WBC 4.0 - 10.5 K/uL 6.3 6.8 8.3  Hemoglobin 12.0 - 15.0 g/dL 13.9 13.2 10.8(L)  Hematocrit 36.0 - 46.0 % 46.0 44.4 36.9  Platelets 150 - 400 K/uL 167 214 200    No results found for: VD25OH  Clinical ASCVD: No  The 10-year ASCVD risk score Mikey Bussing DC Jr., et al., 2013) is: 5.8%   Values used to calculate the score:     Age: 78 years     Sex: Female     Is Non-Hispanic African American: No     Diabetic: Yes     Tobacco smoker: No     Systolic Blood Pressure: 021 mmHg     Is BP treated: No     HDL Cholesterol: 40.1 mg/dL     Total Cholesterol: 219 mg/dL    Depression screen [facility] 2/9 02/12/2020 08/12/2018  Decreased Interest 0 0  Down, Depressed, Hopeless 1 0  PHQ - 2 Score 1 0  Altered sleeping 3 -  Tired, decreased energy 3 -  Change in appetite 0 -  Feeling bad or failure about yourself  3 -  Trouble concentrating 0 -  Moving slowly or fidgety/restless 0 -  Suicidal thoughts 0 -  PHQ-9 Score 10 -  Difficult doing work/chores Somewhat difficult -  Some recent data might be hidden      Social History   Tobacco Use  Smoking Status Former Smoker  . Quit date: 02/19/1992  . Years since quitting: 28.2  Smokeless Tobacco Never Used   BP Readings from Last 3 Encounters:  04/05/20 130/66  02/19/20 120/78  07/20/19 138/78   Pulse Readings from Last 3 Encounters:  04/05/20 87  02/19/20 88  07/20/19 78   Wt  Readings from Last 3 Encounters:  04/05/20 (!) 446 lb 3.2 oz (202.4 kg)  03/28/20 (!) 448 lb (203.2 kg)  02/19/20 (!) 440 lb 6.4 oz (199.8 kg)   BMI Readings from Last 3 Encounters:  04/05/20 69.88 kg/m  03/28/20 74.55 kg/m  02/19/20 68.98 kg/m    Assessment/Interventions: Review of patient past medical history, allergies, medications, health status, including review of consultants reports, laboratory and other test data, was performed as part of comprehensive evaluation and provision of chronic care management services.   SDOH:  (Social Determinants of Health) assessments and interventions performed:  Yes SDOH Interventions   Flowsheet Row Most Recent Value  SDOH Interventions   Financial Strain Interventions Other (Comment)  [working on patient assistance]  Transportation Interventions Intervention Not Indicated     SDOH Screenings   Alcohol Screen: Low Risk   . Last Alcohol Screening Score (AUDIT): 0  Depression (PHQ2-9): Medium Risk  . PHQ-2 Score: 10  Financial Resource Strain: High Risk  . Difficulty of Paying Living Expenses: Hard  Food Insecurity: Food Insecurity Present  . Worried About Charity fundraiser in the Last Year: Sometimes true  . Ran Out of Food in the Last Year: Sometimes true  Housing: Low Risk   . Last Housing Risk Score: 0  Physical Activity: Inactive  . Days of Exercise per Week: 0 days  . Minutes of Exercise per Session: 0 min  Social Connections: Moderately Integrated  . Frequency of Communication with Friends and Family: More than three times a week  . Frequency of Social Gatherings with Friends and Family: More than three times a week  . Attends Religious Services: More than 4 times per year  . Active Member of Clubs or Organizations: No  . Attends Archivist Meetings: Never  . Marital Status: Married  Stress: Stress Concern Present  . Feeling of Stress : To some extent  Tobacco Use: Medium Risk  . Smoking Tobacco Use: Former  Smoker  . Smokeless Tobacco Use: Never Used  Transportation Needs: No Transportation Needs  . Lack of Transportation (Medical): No  . Lack of Transportation (Non-Medical): No    Patient has been going through a tough time lately and just finished cleaning her childhood home. She lost her mom and 2 siblings within the last 5 years, which is why they are selling the childhood home.  Patient currently lives with her husband and daughter Yetta Flock. Her biggest concern with medications right now is the cost. Since switching to medicare, she has been having a lot of issues with costs of medications.   Patient is limited in her ability to exercise given her pain but she recently purchased and cubii and has been trying to use that for 30 minutes twice a day. She reports she usually swims in her pool throughout the summer as well but this hasn't opened yet.  Patient has made changes to her diet in more recent years due to fluid build up. She rarely eats food from outside restaurants and tries to look at package labels and limit processed foods.  CCM Care Plan  Allergies  Allergen Reactions  . Vicodin [Hydrocodone-Acetaminophen] Nausea And Vomiting    Medications Reviewed Today    Reviewed by Viona Gilmore, [facility] (Pharmacist) on 05/17/20 at 1521  Med List Status: <None>  Medication Order Taking? Sig Documenting Provider Last Dose Status Informant  albuterol (PROVENTIL) (2.5 MG/3ML) 0.083% nebulizer solution [id number] Yes 1 VIAL IN NEBULIZER EVERY 4 HOURS AS NEEDED FOR WHEEZING  Patient taking differently: Take 2.5 mg by nebulization every 4 (four) hours as needed for wheezing.   Laurey Morale, MD Taking Active   albuterol (VENTOLIN HFA) 108 [phone] Base) MCG/ACT inhaler [id number] Yes Inhale 2 puffs into the lungs every 4 (four) hours as needed for wheezing or shortness of breath. Laurey Morale, MD Taking Active Self  ALPRAZolam Duanne Moron) 1 MG tablet [id number] No TAKE 1 TABLET BY MOUTH THREE TIMES A DAY   Patient not taking: Reported on 05/17/2020   Laurey Morale, MD Not Taking Active   atorvastatin (LIPITOR) 10  MG tablet [id number] Yes Take 1 tablet (10 mg total) by mouth daily. Shamleffer, Melanie Crazier, MD Taking Active   Blood Glucose Monitoring Suppl (CONTOUR NEXT MONITOR) w/Device KIT [id number]  1 each by Does not apply route 2 (two) times daily. To check blood sugars twice a day. Renato Shin, MD  Active Self  Buprenorphine 15 MCG/HR [place] [id number] Yes Place 1 patch onto the skin every Saturday.  [provider] Taking Active Self           Med Note ([place] Jul 09, 2018  8:49 PM)    furosemide (LASIX) 40 MG tablet [id number] Yes TAKE 1 TABLET BY MOUTH TWICE A DAY Laurey Morale, MD Taking Active   gabapentin (NEURONTIN) 300 MG capsule [id number] Yes Take 300 mg by mouth 2 (two) times daily.  [provider] Taking Active Self  glucose blood (CONTOUR NEXT TEST) test strip [id number]  Use to test blood sugar 2 times daily Renato Shin, MD  Active Self  glucose blood test strip [id number]  Use as instructed Renato Shin, MD  Active Self  HUMALOG MIX 75/25 (75-25) 100 UNIT/ML SUSP injection [id number] Yes INJECT 110 UNITS INTO THE SKIN 2 TIMES DAILY WITH A MEAL. Shamleffer, Melanie Crazier, MD Taking Active   Insulin Syringe-Needle U-100 (INSULIN SYRINGE 1CC/31GX5/16") 31G X 5/16" 1 ML MISC [id number]  2x daily Shamleffer, Melanie Crazier, MD  Active   levothyroxine (SYNTHROID) 75 MCG tablet [id number] Yes TAKE 1 TABLET BY MOUTH EVERY DAY Laurey Morale, MD Taking Active   lidocaine (XYLOCAINE) 5 % ointment [id number] Yes Apply to areas of pain up to bid. [provider] Taking Active   metFORMIN (GLUCOPHAGE) 1000 MG tablet [id number] Yes Take 1 tablet (1,000 mg total) by mouth daily with breakfast. Shamleffer, Melanie Crazier, MD Taking Active   methocarbamol (ROBAXIN) 750 MG tablet [id number] Yes Take 1 tablet (750 mg total) by mouth 2 (two) times daily.  Laurey Morale, MD Taking Active   Multiple Vitamin (MULTIVITAMIN WITH MINERALS) TABS tablet [id number] Yes Take 1 tablet by mouth daily. [provider] Taking Active Self  pramipexole (MIRAPEX) 0.5 MG tablet [id number] Yes TAKE 1 TABLET BY MOUTH AT BEDTIME.  Patient taking differently: at bedtime as needed.   Laurey Morale, MD Taking Active   Semaglutide,0.25 or 0.5MG/DOS, (OZEMPIC, 0.25 OR 0.5 MG/DOSE,) 2 MG/1.5ML SOPN [id number] No Inject 0.375 mLs (0.5 mg total) into the skin once a week.  Patient not taking: Reported on 05/17/2020   Shamleffer, Melanie Crazier, MD Not Taking Active   sertraline (ZOLOFT) 100 MG tablet [id number] Yes TAKE 2 TABLETS (200 MG TOTAL) BY MOUTH AT BEDTIME. Laurey Morale, MD Taking Active   XARELTO 20 MG TABS tablet [id number] Yes TAKE 1 TABLET BY MOUTH EVERY DAY Laurey Morale, MD Taking Active           Patient Active Problem List   Diagnosis Date Noted  . Restless legs syndrome 04/14/2019  . Dyslipidemia 11/07/2018  . Type 2 diabetes mellitus with hyperglycemia, with long-term current use of insulin ([place]) 09/18/2018  . Type 2 diabetes mellitus with diabetic polyneuropathy, with long-term current use of insulin ([place]) 09/18/2018  . Recurrent cellulitis of lower extremity 07/31/2018  . Serratia infection 07/31/2018  . Morbid obesity with body mass index of 60.0-69.9 in adult [facility])   . Sepsis ([place]) 06/19/2018  . Chronic pain 06/19/2018  . Pulmonary embolism ([place]) 03/12/2018  . Diabetes mellitus type 2 in obese ([place])  03/12/2018  . Hypothyroidism 08/14/2017  . Lymphedema of both lower extremities 10/18/2015  . Diabetic neuropathy ([place]) 01/07/2014  . Congenital spondylolysis of lumbosacral region 11/02/2013  . Chronic LBP 10/08/2013  . Obstructive sleep apnea 07/31/2013  . Hearing loss 06/23/2013  . Cellulitis of left lower extremity 07/03/2012  . DCIS (ductal carcinoma in situ) of breast, left, treated with [facility] 11/2007 02/19/2011  . Asthma  02/20/2008  . Hyperlipidemia associated with type 2 diabetes mellitus ([place]) 11/26/2006  . Anxiety and depression 11/26/2006    Immunization History  Administered Date(s) Administered  . Influenza Whole 10/23/2006  . Influenza, Quadrivalent, Recombinant, Inj, Pf 10/20/2017  . Influenza,inj,Quad PF,6+ Mos 11/28/2012, 03/05/2014, 11/04/2018  . Influenza-Unspecified 02/03/2020  . PFIZER(Purple Top)SARS-COV-2 Vaccination 07/06/2019, 07/27/2019  . Pneumococcal Polysaccharide-23 11/23/2006, 11/28/2012    Conditions to be addressed/monitored:  Hyperlipidemia, Diabetes, Asthma, Hypothyroidism, Depression, Anxiety and Neuropathy and Chronic pain  There are no care plans that you recently modified to display for this patient.    Medication Assistance: Application for Xarelto and Ozempic  medication assistance program. in process.  Anticipated assistance start date 06/16/20.  See plan of care for additional detail.  Patient's preferred pharmacy is:  [id number] [place] [place] [address] [address]Phone: [phone]: [phone] [facility] [address] [place]: [phone]: [phone] Uses pill box? Yes - 31 day pill box Pt endorses 100% compliance  We discussed: Current pharmacy is preferred with insurance plan and patient is satisfied with pharmacy services Patient decided to: Continue current medication management strategy  Care Plan and Follow Up Patient Decision:  Patient agrees to Care Plan and Follow-up.  Plan: Telephone follow up appointment with care management team member scheduled for:  2 months  MJeni Salles PharmD BCookePharmacist [place]at [phone]

## 2020-05-17 NOTE — Telephone Encounter (Signed)
-----   Message from Viona Gilmore, [facility] sent at 05/17/2020  9:12 AM EDT ----- Regarding: CCM referral Hi,  Can you please place a CCM referral for Ms. Campanile?  Thank you, Maddie

## 2020-05-18 ENCOUNTER — Telehealth: Payer: Self-pay | Admitting: Licensed Clinical Social Worker

## 2020-05-18 NOTE — Telephone Encounter (Addendum)
Xarelto PAP sent securely to Jeni Salles, PharmD, with [facility] CCM team. Pt has a visit at primary care tomorrow and pharmacy team will f/u with her to complete during that visit.   Westley Hummer, MSW, [place]  [phone]

## 2020-05-19 ENCOUNTER — Other Ambulatory Visit: Payer: Self-pay

## 2020-05-19 ENCOUNTER — Encounter: Payer: Self-pay | Admitting: Family Medicine

## 2020-05-19 ENCOUNTER — Ambulatory Visit (INDEPENDENT_AMBULATORY_CARE_PROVIDER_SITE_OTHER): Payer: Medicare Other | Admitting: Family Medicine

## 2020-05-19 VITALS — BP 130/74 | HR 87 | Temp 98.4°F | Wt >= 6400 oz

## 2020-05-19 DIAGNOSIS — N390 Urinary tract infection, site not specified: Secondary | ICD-10-CM

## 2020-05-19 DIAGNOSIS — R300 Dysuria: Secondary | ICD-10-CM

## 2020-05-19 DIAGNOSIS — E785 Hyperlipidemia, unspecified: Secondary | ICD-10-CM | POA: Diagnosis not present

## 2020-05-19 LAB — LIPID PANEL
Cholesterol: 162 mg/dL (ref 0–200)
HDL: 50.7 mg/dL (ref >39.00)
LDL Cholesterol: 89 mg/dL (ref 0–99)
NonHDL: 110.9
Total CHOL/HDL Ratio: 3
Triglycerides: 108 mg/dL (ref 0.0–149.0)
VLDL: 21.6 mg/dL (ref 0.0–40.0)

## 2020-05-19 LAB — POC URINALSYSI DIPSTICK (AUTOMATED)
Bilirubin, UA: NEGATIVE
Glucose, UA: POSITIVE — AB
Ketones, UA: NEGATIVE
Protein, UA: POSITIVE — AB
Spec Grav, UA: >=1.03 — AB (ref 1.010–1.025)
Urobilinogen, UA: 0.2 E.U./dL
pH, UA: 5.5 (ref 5.0–8.0)

## 2020-05-19 MED ORDER — CIPROFLOXACIN HCL 500 MG PO TABS: 500.0000 mg | ORAL_TABLET | Freq: Two times a day (BID) | ORAL | 0 refills | Status: DC

## 2020-05-19 NOTE — Progress Notes (Signed)
   Subjective:    Patient ID: Cheyenne Guzman, female    DOB: [birth year], 55 y.o.   MRN: [id number]  HPI Here for 5 days of low back pain, increased urgency to urinate, and having a foul odor to the urine. No fever or nausea. Drinking plenty of fluids.    Review of Systems  Constitutional: Negative.   Respiratory: Negative.   Cardiovascular: Negative.   Gastrointestinal: Negative.   Genitourinary: Positive for dysuria, frequency and urgency. Negative for flank pain and hematuria.       Objective:   Physical Exam Constitutional:      Appearance: Normal appearance. She is not ill-appearing.  Cardiovascular:     Rate and Rhythm: Normal rate and regular rhythm.     Pulses: Normal pulses.     Heart sounds: Normal heart sounds.  Pulmonary:     Effort: Pulmonary effort is normal.     Breath sounds: Normal breath sounds.  Abdominal:     General: Abdomen is flat. Bowel sounds are normal. There is no distension.     Palpations: Abdomen is soft. There is no mass.     Tenderness: There is no abdominal tenderness. There is no right CVA tenderness, left CVA tenderness, guarding or rebound.     Hernia: No hernia is present.  Neurological:     Mental Status: She is alert.           Assessment & Plan:  UTI, treat with Cipro for 7 days. Culture the sample.  [name], MD

## 2020-05-20 ENCOUNTER — Telehealth: Payer: Self-pay | Admitting: Licensed Clinical Social Worker

## 2020-05-20 LAB — URINE CULTURE
MICRO NUMBER:: 11826800
SPECIMEN QUALITY:: ADEQUATE

## 2020-05-20 NOTE — Telephone Encounter (Signed)
Message sent to Jeni Salles regarding Xarelto paperwork sent to her to complete with pt yesterday at appt. PharmD is currently out of office.   Westley Hummer, MSW, [place]  [phone]

## 2020-05-23 ENCOUNTER — Telehealth: Payer: Self-pay | Admitting: Licensed Clinical Social Worker

## 2020-05-23 NOTE — Telephone Encounter (Signed)
Spoke with PharmD Jeni Salles, she has complete pt portion and is working on provider portion for Xarelto PAP. She will update me when sent in.   Westley Hummer, MSW, [place]  [phone]

## 2020-05-31 ENCOUNTER — Ambulatory Visit (HOSPITAL_BASED_OUTPATIENT_CLINIC_OR_DEPARTMENT_OTHER): Payer: Medicare Other | Attending: Internal Medicine | Admitting: Cardiovascular Disease

## 2020-05-31 DIAGNOSIS — G4733 Obstructive sleep apnea (adult) (pediatric): Secondary | ICD-10-CM | POA: Diagnosis not present

## 2020-05-31 DIAGNOSIS — R0602 Shortness of breath: Secondary | ICD-10-CM | POA: Diagnosis not present

## 2020-05-31 DIAGNOSIS — M7918 Myalgia, other site: Secondary | ICD-10-CM | POA: Diagnosis not present

## 2020-05-31 DIAGNOSIS — Z6841 Body Mass Index (BMI) 40.0 and over, adult: Secondary | ICD-10-CM | POA: Insufficient documentation

## 2020-05-31 DIAGNOSIS — M5442 Lumbago with sciatica, left side: Secondary | ICD-10-CM | POA: Diagnosis not present

## 2020-05-31 DIAGNOSIS — G894 Chronic pain syndrome: Secondary | ICD-10-CM | POA: Diagnosis not present

## 2020-05-31 DIAGNOSIS — M5441 Lumbago with sciatica, right side: Secondary | ICD-10-CM | POA: Diagnosis not present

## 2020-06-01 ENCOUNTER — Other Ambulatory Visit: Payer: Self-pay

## 2020-06-03 ENCOUNTER — Encounter (HOSPITAL_BASED_OUTPATIENT_CLINIC_OR_DEPARTMENT_OTHER): Payer: Self-pay | Admitting: Cardiovascular Disease

## 2020-06-03 NOTE — Procedures (Signed)
Patient Name: Cheyenne Guzman, Cheyenne Guzman Date: 05/31/2020 Gender: Female D.O.B: 05/11/1965 Age (years): 54 Referring Provider: Nadean Corwin Hilty Height (inches): 67 Interpreting Physician: Shelva Majestic MD, ABSM Weight (lbs): 440 RPSGT: Carolin Coy BMI: 69 MRN: [id number] Neck Size: 21.00  CLINICAL INFORMATION Sleep Study Type: Split Night CPAP  Indication for sleep study: Fatigue, Obesity, OSA, Snoring, daytime sleepiness  Epworth Sleepiness Score: 23  SLEEP STUDY TECHNIQUE As per the AASM Manual for the Scoring of Sleep and Associated Events v2.3 (April 2016) with a hypopnea requiring 4% desaturations.  The channels recorded and monitored were frontal, central and occipital EEG, electrooculogram (EOG), submentalis EMG (chin), nasal and oral airflow, thoracic and abdominal wall motion, anterior tibialis EMG, snore microphone, electrocardiogram, and pulse oximetry. Continuous positive airway pressure (CPAP) was initiated when the patient met split night criteria and was titrated according to treat sleep-disordered breathing.  MEDICATIONS Medications self-administered by patient taken the night of the study : GABAPENTIN, HUMALOG, PRAMIPEXOLE  RESPIRATORY PARAMETERS Diagnostic Total AHI (/hr): 56.8 RDI (/hr): 58.5 OA Index (/hr): - CA Index (/hr): 0.0 REM AHI (/hr): 79.3 NREM AHI (/hr): 52.6 Supine AHI (/hr): 56.8 Non-supine AHI (/hr): 0 Min O2 Sat (%): 70.0 Mean O2 (%): 90.4 Time below 88% (min): 28.1   Titration Optimal Pressure (cm): 20 AHI at Optimal Pressure (/hr): 0 Min O2 at Optimal Pressure (%): 90.0 Supine % at Optimal (%): 100 Sleep % at Optimal (%): 100   SLEEP ARCHITECTURE The recording time for the entire night was 397.7 minutes.  During a baseline period of 199.2 minutes, the patient slept for 177.5 minutes in REM and nonREM, yielding a sleep efficiency of 89.1%%. Sleep onset after lights out was 18.7 minutes with a REM latency of 67.0 minutes. The patient  spent 3.1%% of the night in stage N1 sleep, 81.1%% in stage N2 sleep, 0.0%% in stage N3 and 15.8% in REM.  During the titration period of 183.8 minutes, the patient slept for 143.0 minutes in REM and nonREM, yielding a sleep efficiency of 77.8%%. Sleep onset after CPAP initiation was 32.2 minutes with a REM latency of 54.0 minutes. The patient spent 3.1%% of the night in stage N1 sleep, 71.3%% in stage N2 sleep, 0.0%% in stage N3 and 25.5% in REM.  CARDIAC DATA The 2 lead EKG demonstrated sinus rhythm. The mean heart rate was 100.0 beats per minute. Other EKG findings include: None.  LEG MOVEMENT DATA The total Periodic Limb Movements of Sleep (PLMS) were 0. The PLMS index was 0.0 .  IMPRESSIONS - Severe obstructive sleep apnea occurred during the diagnostic portion of the study (AHI 56.8/h); events were more severe during REM sleep (AHI 79.3). CPAP was initiated at 7 cm and was titrated to maximal pressure at 20 cm of water: AHI at 19 cm was 7.3/h and at 20 cm was 0/h with 22:19 minutes of sleep and 5 minutes of REM sleep at 20 cm. - No significant central sleep apnea occurred during the diagnostic portion of the study (CAI 0.0/hour). - Severe oxygen desaturation was noted during the diagnostic portion of the study to a nadir of 70.0%. - The patient snored with moderate snoring volume during the diagnostic portion of the study. - No cardiac abnormalities were noted during this study. - Clinically significant periodic limb movements did not occur during sleep.  DIAGNOSIS - Obstructive Sleep Apnea (G47.33) - Nocturnal hypoxia  RECOMMENDATIONS - Recommend an initial trial of CPAP therapy with EPR of 3 at 20 cm H2O  with heated humidification.  A Medium size Philips Respironics Full Face Mask Dreamwear mask was used for the titration. If higher pressures are needed in the future BiPAP will be necessary. - Effort should be made to optimize nasal and oropharyngeal patency. - Avoid alcohol,  sedatives and other CNS depressants that may worsen sleep apnea and disrupt normal sleep architecture. - Sleep hygiene should be reviewed to assess factors that may improve sleep quality. - Weight management (BMI 69) and regular exercise should be initiated or continued. - Recommend a download and sleep clinic evaluation after 4 weeks of therapy  [Electronically signed] 06/03/2020 02:33 PM  Shelva Majestic MD, [facility], ABSM Diplomate, American Board of Sleep Medicine   NPI: [id number] [place] PH: [phone]   FX: [phone] [place]

## 2020-06-06 ENCOUNTER — Telehealth: Payer: Self-pay | Admitting: Licensed Clinical Social Worker

## 2020-06-06 NOTE — Telephone Encounter (Signed)
LCSW f/u with Jeni Salles, PharmD, to see if PAP for Xarelto had ever been submitted. Await update.   Westley Hummer, MSW, [place]  [phone]

## 2020-06-07 ENCOUNTER — Telehealth: Payer: Self-pay | Admitting: Licensed Clinical Social Worker

## 2020-06-07 NOTE — Telephone Encounter (Signed)
I f/u with [employer] regarding pt application as Watt Climes, PharmD, let me know she had submitted PAP. Unfortunately pt was denied, determination letter will be sent to pt. I also have made [place] aware. Pt unfortunately also has also been denied for assistance through [employer] program. I remain available moving forward if any further ongoing questions/concerns.  Westley Hummer, MSW, [place]  [phone]

## 2020-06-14 ENCOUNTER — Telehealth: Payer: Self-pay | Admitting: *Deleted

## 2020-06-14 ENCOUNTER — Other Ambulatory Visit: Payer: Self-pay | Admitting: Family Medicine

## 2020-06-14 NOTE — Telephone Encounter (Signed)
-----   Message from Cheyenne Sine, MD sent at 06/03/2020  3:07 PM EDT ----- [name] please notify pt and try to expedite CPAP set -up. Thx

## 2020-06-14 NOTE — Telephone Encounter (Signed)
Patient notified of sleep study results and recommendations. CPAP sent to [facility].

## 2020-06-15 ENCOUNTER — Other Ambulatory Visit: Payer: Self-pay | Admitting: Internal Medicine

## 2020-06-16 DIAGNOSIS — G4733 Obstructive sleep apnea (adult) (pediatric): Secondary | ICD-10-CM | POA: Diagnosis not present

## 2020-07-06 IMAGING — CT CT HEAD W/O CM
4 series · 16 of 47 positions shown, 18 images · non-contrast
Comparison: Brain MRI 07/20/2013.  Head CT 10/02/2009.

CLINICAL DATA: 51-year-old female with fever, headache, neck and
back pain. Recently treated for cellulitis.

EXAM:
CT HEAD WITHOUT CONTRAST
TECHNIQUE: Contiguous axial images were obtained from the base of the skull
through the vertex without intravenous contrast.

[Series 3: head wo · axial · 0.47mm/px · z∈[-112,+8]mm · 7 of 34 slices shown, 9 images]
[im 5/34  brain]
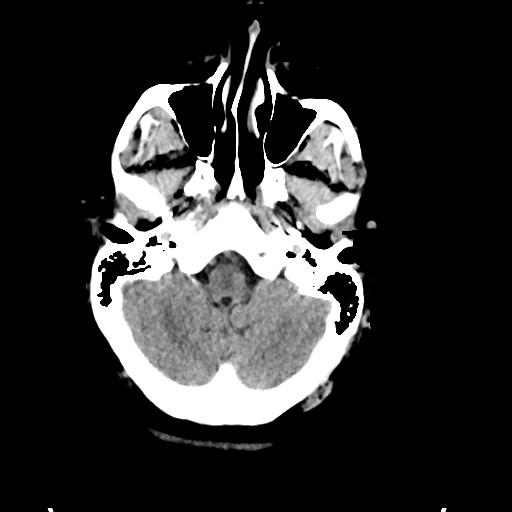
[im 5/34  bone]
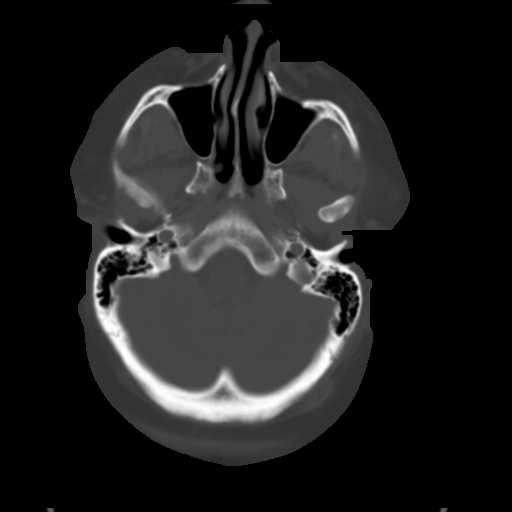
[im 9/34  brain]
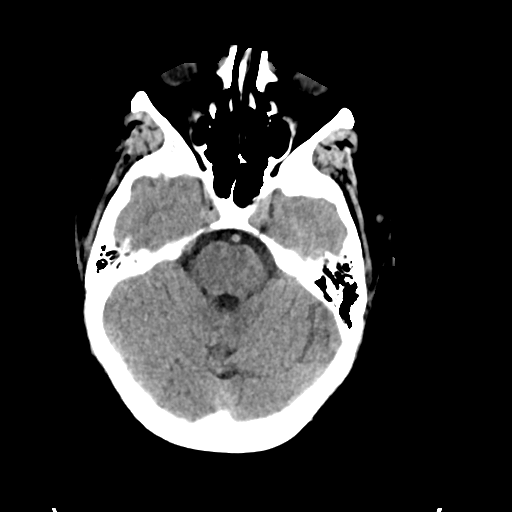
[im 13/34  brain]
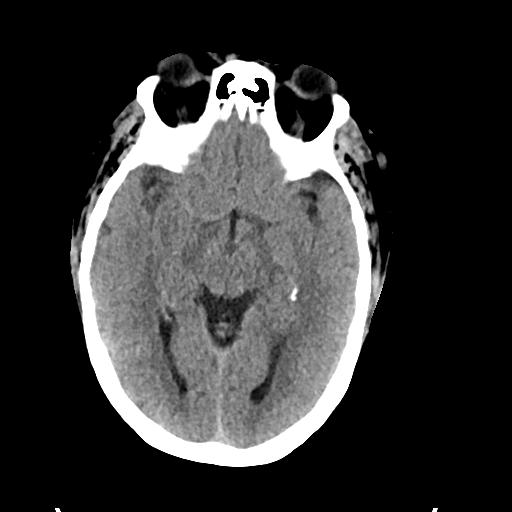
[im 17/34  brain]
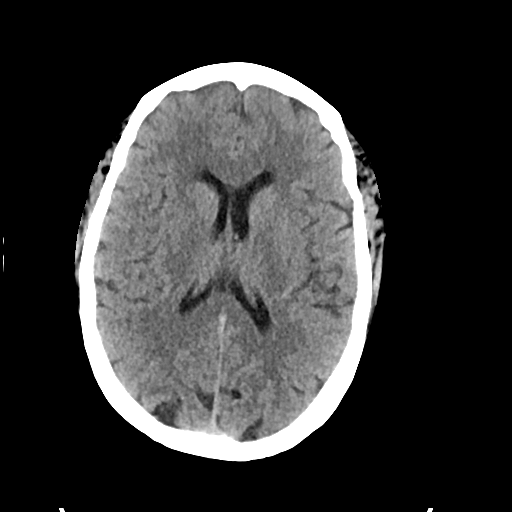
[im 21/34  brain]
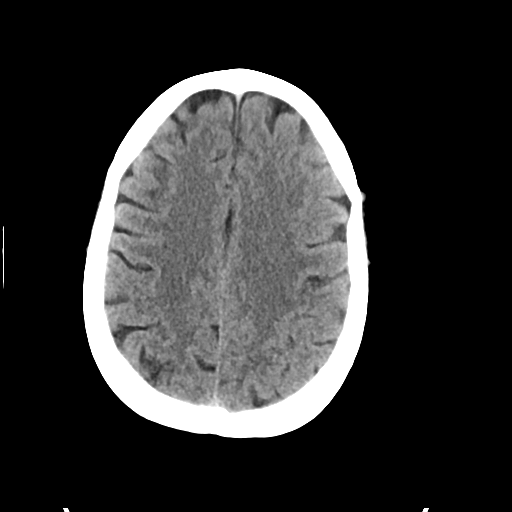
[im 21/34  bone]
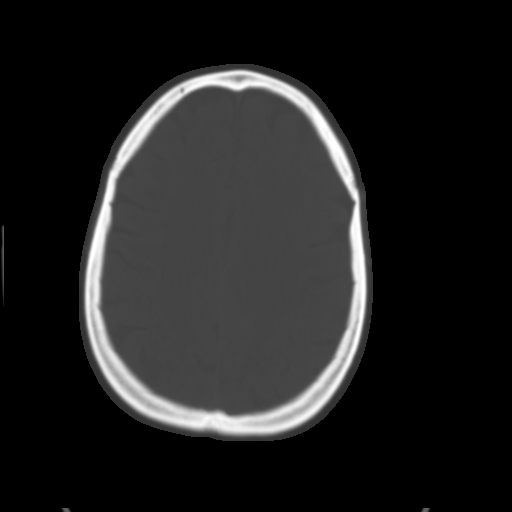
[im 25/34  brain]
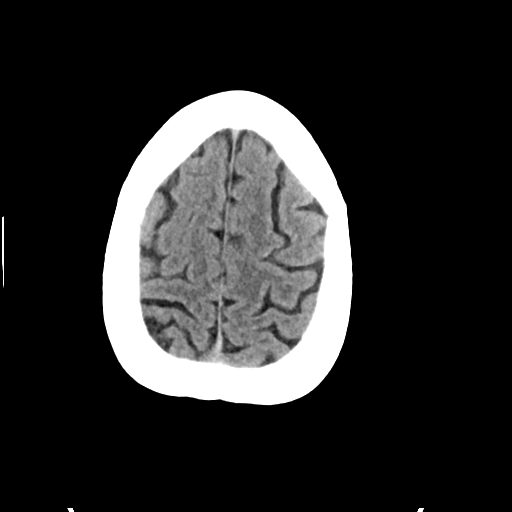
[im 29/34  brain]
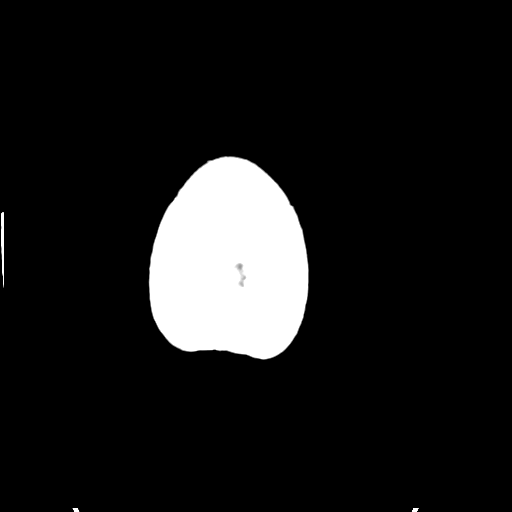

[Series 4: head bone · axial · 0.47mm/px · z∈[-116,-84]mm · 3 of 84 slices shown]
[im 9/84  bone]
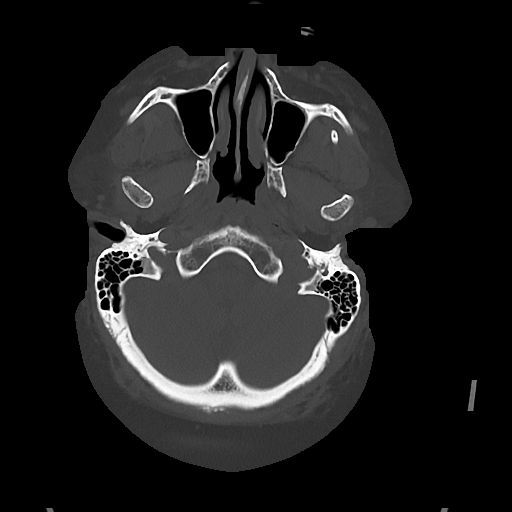
[im 17/84  bone]
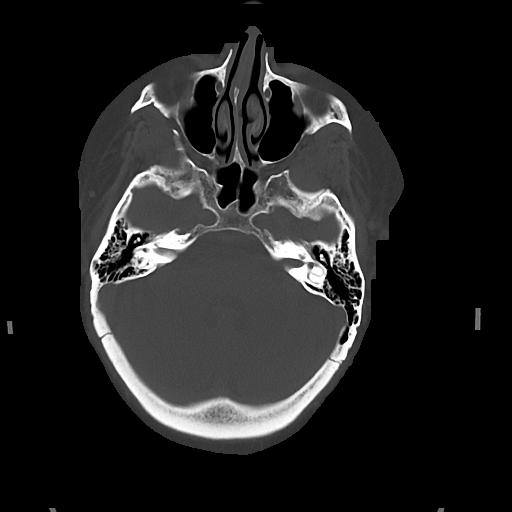
[im 25/84  bone]
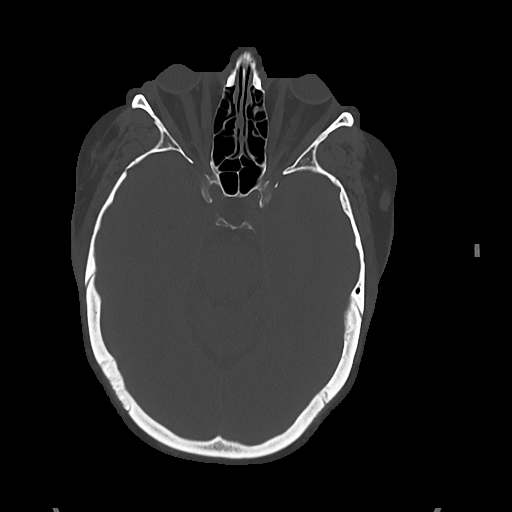

[Series 5: cor soft · coronal · 0.33mm/px · 3 of 72 slices shown]
[im 24/72  brain]
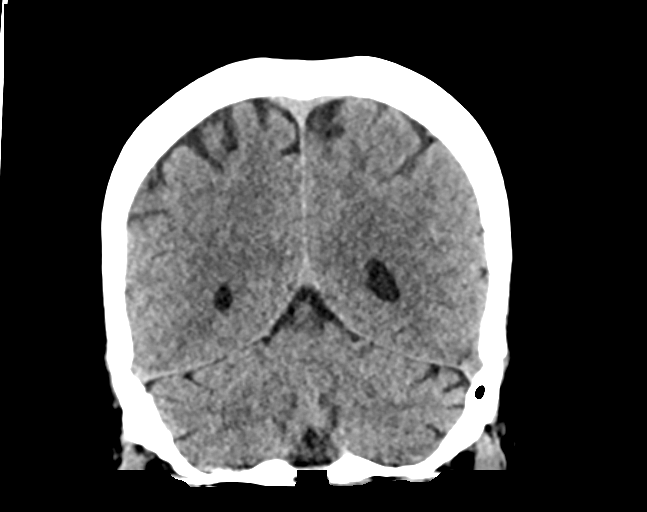
[im 32/72  brain]
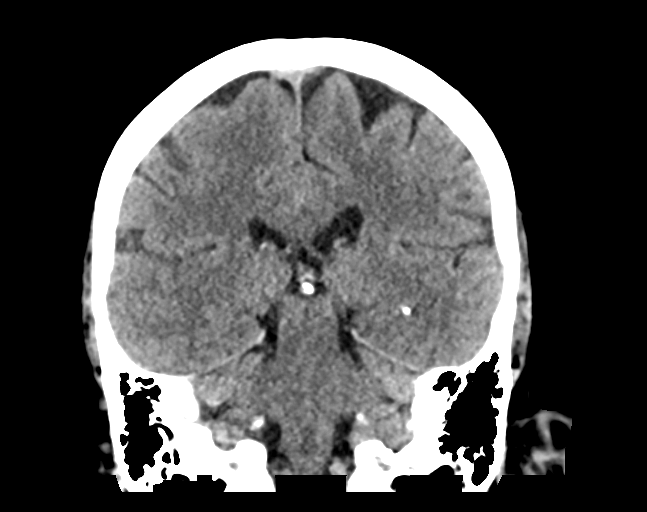
[im 40/72  brain]
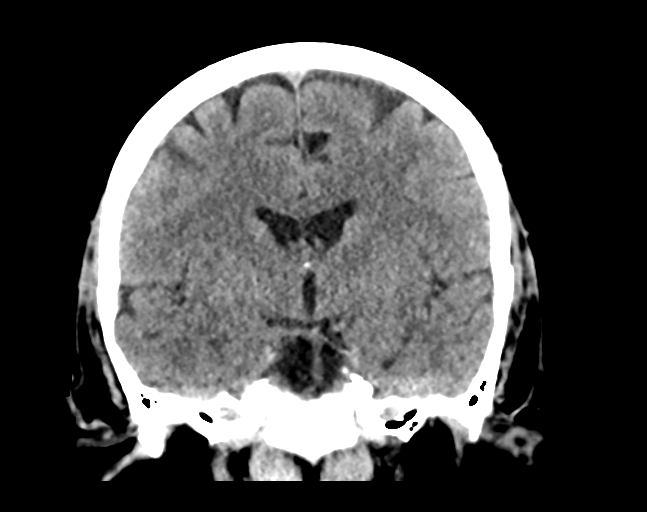

[Series 6: sag soft · sagittal · 0.33mm/px · 3 of 67 slices shown]
[im 23/67  brain]
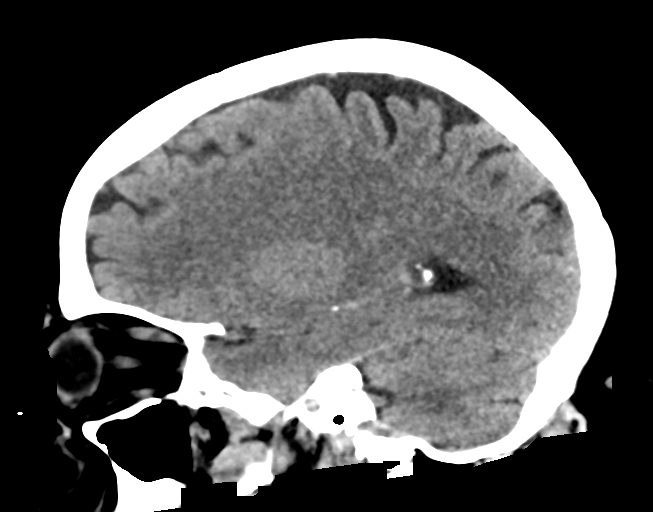
[im 34/67  brain]
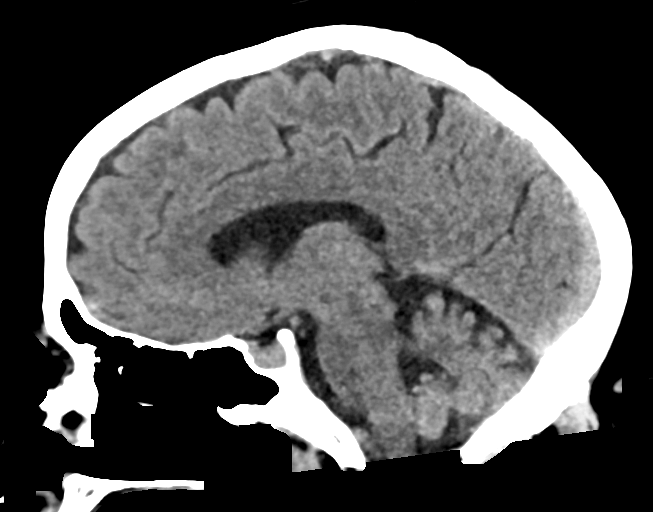
[im 45/67  brain]
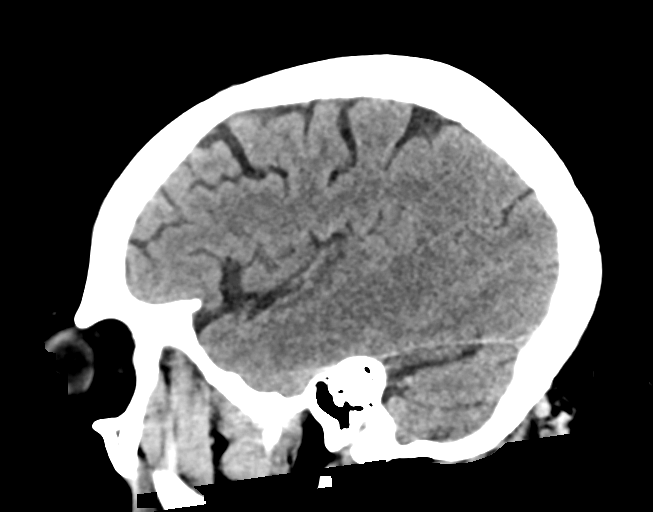

[16 of 47 positions shown; findings below may reference images not displayed]

FINDINGS: Brain: Stable cerebral volume since 8112, within normal limits. No
midline shift, ventriculomegaly, mass effect, evidence of mass
lesion, intracranial hemorrhage or evidence of cortically based
acute infarction. Gray-white matter differentiation is within normal
limits throughout the brain. Mild chronic dystrophic calcifications
at the globus pallidus.

Vascular: Mild Calcified atherosclerosis at the skull base. No
suspicious intracranial vascular hyperdensity.

Skull: Negative.

Sinuses/Orbits: Paranasal sinuses and mastoids are stable and clear.

Other: Visualized orbits and scalp soft tissues are within normal
limits. Negative visible noncontrast deep soft tissue spaces of the
face.
IMPRESSION: Normal noncontrast Head CT.

## 2020-07-07 ENCOUNTER — Other Ambulatory Visit: Payer: Self-pay

## 2020-07-07 ENCOUNTER — Telehealth (INDEPENDENT_AMBULATORY_CARE_PROVIDER_SITE_OTHER): Payer: Medicare Other | Admitting: Internal Medicine

## 2020-07-07 ENCOUNTER — Encounter: Payer: Self-pay | Admitting: Internal Medicine

## 2020-07-07 VITALS — HR 94 | Wt >= 6400 oz

## 2020-07-07 DIAGNOSIS — G4733 Obstructive sleep apnea (adult) (pediatric): Secondary | ICD-10-CM

## 2020-07-07 DIAGNOSIS — Z6841 Body Mass Index (BMI) 40.0 and over, adult: Secondary | ICD-10-CM

## 2020-07-07 DIAGNOSIS — Z86711 Personal history of pulmonary embolism: Secondary | ICD-10-CM | POA: Diagnosis not present

## 2020-07-07 DIAGNOSIS — R0602 Shortness of breath: Secondary | ICD-10-CM | POA: Diagnosis not present

## 2020-07-07 NOTE — Progress Notes (Signed)
Virtual Visit via Video Note   This visit type was conducted due to national recommendations for restrictions regarding the COVID-19 Pandemic (e.g. social distancing) in an effort to limit this patient's exposure and mitigate transmission in our community.  Due to her co-morbid illnesses, this patient is at least at moderate risk for complications without adequate follow up.  This format is felt to be most appropriate for this patient at this time.  All issues noted in this document were discussed and addressed.  A limited physical exam was performed with this format.  Please refer to the patient's chart for her consent to telehealth for [facility].      Date:  07/07/2020   ID:  Cheyenne Guzman, DOB [birth year], MRN [id number] The patient was identified using 2 identifiers.  Evaluation Performed:  Follow-Up Visit  Patient Location:  [place] [address]  Provider location:   [address], [place] [address] [place], [place] [address]  PCP:  Laurey Morale, MD  Cardiologist:  None Electrophysiologist:  None   Chief Complaint:  Follow-up dyspnea  History of Present Illness:    Cheyenne Guzman is a 55 y.o. female who presents via audio/video conferencing for a telehealth visit today.   This is a pleasant 55 year old female kindly referred for evaluation management of dyspnea by Dr. Hilarie Fredrickson.  He had taking care of her sister who died recently.  Unfortunately she is also lost several other family members and her father over the past several years and she was quite tearful at that today.  She has had persistent weight gain some of which she says is lower extremity edema.  Her PCP has been giving her Lasix and recently doubled the dose.  She reports progressive dyspnea which has been worse over the past several months.  Weight is up an additional 50 pounds now with a total weight of 446 pounds and a BMI close to 70.  She tells me she does have a diagnosis of obstructive sleep apnea  but has been noncompliant with CPAP this was in 2015.  She also has a history of pulmonary emboli x2.  She has been on chronic Xarelto.  Her last CT scan which was a CT pulmonary angiogram in 2021 showed no evidence of any persistent pulmonary emboli.  There was no coronary calcium.  She denies any chest pain.  She reports her dyspnea is mostly with exertion.  She denies any significant orthopnea.  She is noted to snore and is told that she stops breathing by her daughter who is with her at the visit today.  She has difficulty ambulating.  She has chronic neuropathy.  She is wearing compression stockings.  She probably has venous hypertension as well.  07/07/2020  Cheyenne Guzman returns today for follow-up.  She has undergone sleep study which indicated severe obstructive sleep apnea.  She is currently on CPAP and reports compliance with it.  She is also been on diuretics with improvement in her shortness of breath.  She had an echocardiogram which demonstrated normal systolic function and grade 2 diastolic dysfunction.  Fortunately, the TR gradient was insufficient for calculating the pulmonary artery pressure.  The patient does not have symptoms concerning for COVID-19 infection (fever, chills, cough, or new SHORTNESS OF BREATH).    Prior CV studies:   The following studies were reviewed today:  Chart reviewed  PMHx:  Past Medical History:  Diagnosis Date   Anxiety    per pt. - mild  Arthritis    back & ankles    Asthma    seasonal    Breast cancer ([place])    Cancer ([place])    breast, sees Dr. Jana Hakim , Left - 11/2007   Cellulitis 2020   severe - Left leg    Complication of anesthesia 2013   severe muscle spasms-sore-no doc   Depression    Diabetes mellitus    sees Dr. Chalmers Cater, diagnosed 2003   HA (headache)    Hearing loss    Went to [facility] and Throat,Dr Laing,both ears   Hyperlipidemia    Kidney infection    - hosp. [facility]- septic- 10/2009   Low back pain    Peripheral  neuropathy    R sided numbness- face & jaw   Pulmonary embolism ([place]) 2020   Sepsis ([place]) 2020   Sleep apnea 9/15   severe-just started cpap-doing well   TIA (transient ischemic attack)    in 2007,2008, and 2011   Tinnitus of right ear    Urosepsis 08-2009   with Klebsiella    Past Surgical History:  Procedure Laterality Date   ABDOMINAL HYSTERECTOMY  08/2004   BREAST BIOPSY  06/22/2011   Procedure: BREAST BIOPSY;  Surgeon: Stark Klein, MD;  Location: [place];  Service: General;  Laterality: Right;   BREAST LUMPECTOMY     BREAST SURGERY  11/2007   left ductal carcinoma in situ   CARPAL TUNNEL RELEASE Right 10/13/2013   Procedure: RIGHT CARPAL TUNNEL RELEASE;  Surgeon: Hessie Dibble, MD;  Location: [place];  Service: Orthopedics;  Laterality: Right;   CARPAL TUNNEL RELEASE Left 11/24/2013   Procedure: LEFT CARPAL TUNNEL RELEASE WITH CYST EXCISION ;  Surgeon: Hessie Dibble, MD;  Location: [place];  Service: Orthopedics;  Laterality: Left;   EAR CYST EXCISION Left 11/24/2013   Procedure: CYST REMOVAL;  Surgeon: Hessie Dibble, MD;  Location: [place];  Service: Orthopedics;  Laterality: Left;   FOOT SURGERY  2000   plantar fasciitis-left   LUMBAR DISC SURGERY  03-29-14   Fusion, revision 04-27-14 L4-5 [place] with graft   NERVE GRAFT  06-01-08   cadaver graft to right inferior alveolar nerve  in [place]    FAMHx:  Family History  Problem Relation Age of Onset   Cancer Mother        breast   Cancer Father        lung   Diabetes Father    Cancer Sister 22       colon   Cancer Maternal Aunt        breast - both   Diabetes Sister    Anesthesia problems Neg Hx     SOCHx:   reports that she quit smoking about 28 years ago. Her smoking use included cigarettes. She has never used smokeless tobacco. She reports previous alcohol use. She reports that she does not use drugs.  ALLERGIES:  Allergies  Allergen Reactions    Vicodin [Hydrocodone-Acetaminophen] Nausea And Vomiting    MEDS:  Current Meds  Medication Sig   albuterol (PROVENTIL) (2.5 MG/3ML) 0.083% nebulizer solution 1 VIAL IN NEBULIZER EVERY 4 HOURS AS NEEDED FOR WHEEZING (Patient taking differently: Take 2.5 mg by nebulization every 4 (four) hours as needed for wheezing.)   albuterol (VENTOLIN HFA) 108 (90 Base) MCG/ACT inhaler Inhale 2 puffs into the lungs every 4 (four) hours as needed for wheezing or shortness of breath.   ALPRAZolam (  XANAX) 1 MG tablet TAKE 1 TABLET BY MOUTH THREE TIMES A DAY   atorvastatin (LIPITOR) 10 MG tablet Take 1 tablet (10 mg total) by mouth daily.   Blood Glucose Monitoring Suppl (CONTOUR NEXT MONITOR) w/Device KIT 1 each by Does not apply route 2 (two) times daily. To check blood sugars twice a day.   Buprenorphine 15 MCG/HR PTWK Place 1 patch onto the skin every Saturday.    furosemide (LASIX) 40 MG tablet TAKE 1 TABLET BY MOUTH TWICE A DAY   gabapentin (NEURONTIN) 300 MG capsule Take 300 mg by mouth 2 (two) times daily.    glucose blood (CONTOUR NEXT TEST) test strip Use to test blood sugar 2 times daily   glucose blood test strip Use as instructed   HUMALOG MIX 75/25 (75-25) 100 UNIT/ML SUSP injection INJECT 110 UNITS INTO THE SKIN 2 TIMES DAILY WITH A MEAL. (Patient taking differently: INJECT 120 UNITS INTO THE SKIN 2 TIMES DAILY WITH A MEAL.)   Insulin Syringe-Needle U-100 (INSULIN SYRINGE 1CC/31GX5/16") 31G X 5/16" 1 ML MISC 2x daily   levothyroxine (SYNTHROID) 75 MCG tablet TAKE 1 TABLET BY MOUTH EVERY DAY   lidocaine (XYLOCAINE) 5 % ointment Apply to areas of pain up to bid.   metFORMIN (GLUCOPHAGE) 1000 MG tablet Take 1 tablet (1,000 mg total) by mouth daily with breakfast.   methocarbamol (ROBAXIN) 750 MG tablet Take 1 tablet (750 mg total) by mouth 2 (two) times daily.   Multiple Vitamin (MULTIVITAMIN WITH MINERALS) TABS tablet Take 1 tablet by mouth daily.   pramipexole (MIRAPEX) 0.5 MG tablet TAKE 1  TABLET BY MOUTH EVERYDAY AT BEDTIME   sertraline (ZOLOFT) 100 MG tablet TAKE 2 TABLETS (200 MG TOTAL) BY MOUTH AT BEDTIME.   XARELTO 20 MG TABS tablet TAKE 1 TABLET BY MOUTH EVERY DAY     ROS: Pertinent items noted in HPI and remainder of comprehensive ROS otherwise negative.  Labs/Other Tests and Data Reviewed:    Recent Labs: 02/19/2020: ALT 16; Brain Natriuretic Peptide 18; BUN 17; Creat 0.92; Potassium 4.4; Sodium 140; TSH 3.45   Recent Lipid Panel Lab Results  Component Value Date/Time   CHOL 162 05/19/2020 11:48 AM   TRIG 108.0 05/19/2020 11:48 AM   HDL 50.70 05/19/2020 11:48 AM   CHOLHDL 3 05/19/2020 11:48 AM   LDLCALC 89 05/19/2020 11:48 AM   LDLDIRECT 151.0 11/05/2018 04:11 PM    Wt Readings from Last 3 Encounters:  07/07/20 (!) 440 lb (199.6 kg)  05/31/20 (!) 440 lb (199.6 kg)  05/19/20 (!) 444 lb (201.4 kg)     Exam:    Vital Signs:  Pulse 94   Wt (!) 440 lb (199.6 kg)   BMI 68.91 kg/m    General appearance: alert and no distress Lungs: no visual respiratory difficulty Abdomen: morbidly obese Extremities: extremities normal, atraumatic, no cyanosis or edema Skin: Skin color, texture, turgor normal. No rashes or lesions Neurologic: Grossly normal Psych: Pleasant  ASSESSMENT & PLAN:    Dyspnea on exertion Super morbid obesity History of PE x2 on chronic Xarelto OSA-noncompliant with CPAP  Ms. Head has noted improvement in her shortness of breath on twice daily diuretics as well as using her CPAP.  She notes that when she does not take her diuretic she does have more shortness of breath, however sometimes is difficult for her to take them due to frequent urination.  Hopefully improved energy and better sleep with CPAP will help her continue to lose weight which I think  ultimately will help her upper airway resistance and shortness of breath.  I suspect she does have pulmonary hypertension which was unfortunately not able to be demonstrated by her echo.   If her symptoms worsen, we could consider right heart catheterization, but for now continue her diuretics and CPAP.  COVID-19 Education: The signs and symptoms of COVID-19 were discussed with the patient and how to seek care for testing (follow up with PCP or arrange E-visit).  The importance of social distancing was discussed today.  Patient Risk:   After full review of this patients clinical status, I feel that they are at least moderate risk at this time.  Time:   Today, I have spent 25 minutes with the patient with telehealth technology discussing dyspnea, OSA, history of pulmonary emboli, weight loss, diuretics.     Medication Adjustments/Labs and Tests Ordered: Current medicines are reviewed at length with the patient today.  Concerns regarding medicines are outlined above.   Tests Ordered: No orders of the defined types were placed in this encounter.   Medication Changes: No orders of the defined types were placed in this encounter.   Disposition:  in 6 month(s)  Pixie Casino, MD, [facility], [place] Director of the Advanced Lipid Disorders &  Cardiovascular Risk Reduction Clinic Diplomate of the American Board of Clinical Lipidology Attending Cardiologist  Direct Dial: [phone]  Fax: [phone]  Website:  [email or web address]  Pixie Casino, MD  07/07/2020 9:59 PM

## 2020-07-07 NOTE — Patient Instructions (Signed)
Medication Instructions:  Your physician recommends that you continue on your current medications as directed. Please refer to the Current Medication list given to you today.    Follow-Up: At [facility], you and your health needs are our priority.  As part of our continuing mission to provide you with exceptional heart care, we have created designated Provider Care Teams.  These Care Teams include your primary Cardiologist (physician) and Advanced Practice Providers (APPs -  Physician Assistants and Nurse Practitioners) who all work together to provide you with the care you need, when you need it.  We recommend signing up for the patient portal called "MyChart".  Sign up information is provided on this After Visit Summary.  MyChart is used to connect with patients for Virtual Visits (Telemedicine).  Patients are able to view lab/test results, encounter notes, upcoming appointments, etc.  Non-urgent messages can be sent to your provider as well.   To learn more about what you can do with MyChart, go to [email or web address].    Your next appointment:   6 month(s)  The format for your next appointment:   In Person  Provider:   You may see Dr. Debara Pickett or one of the following Advanced Practice Providers on your designated Care Team:   Almyra Deforest, PA-C Fabian Sharp, [place] or  Roby Lofts, [place]   Other Instructions

## 2020-07-11 ENCOUNTER — Encounter: Payer: Self-pay | Admitting: Family Medicine

## 2020-07-12 NOTE — Telephone Encounter (Signed)
Yes, everyone over the age of 48 should get this

## 2020-07-17 DIAGNOSIS — G4733 Obstructive sleep apnea (adult) (pediatric): Secondary | ICD-10-CM | POA: Diagnosis not present

## 2020-07-26 ENCOUNTER — Other Ambulatory Visit: Payer: Self-pay

## 2020-07-26 ENCOUNTER — Encounter: Payer: Self-pay | Admitting: Family Medicine

## 2020-07-26 MED ORDER — CEPHALEXIN 500 MG PO CAPS: ORAL_CAPSULE | ORAL | 0 refills | Status: DC

## 2020-07-26 NOTE — Telephone Encounter (Signed)
Call in Keflex 500 mg TID for 10 days

## 2020-07-26 NOTE — Telephone Encounter (Signed)
Spoke with pt aware to pick up Rx for Keflex from her pharmacy.

## 2020-08-02 ENCOUNTER — Telehealth: Payer: Self-pay | Admitting: Pharmacist

## 2020-08-02 NOTE — Chronic Care Management (AMB) (Signed)
Chronic Care Management Pharmacy Assistant   Name: Cheyenne Guzman  MRN: [id number] DOB: 1965/09/05   Reason for Encounter: Disease State/ Diabetes Assessment Call.    Conditions to be addressed/monitored: DMII   Recent office visits:  05/19/20 Cheyenne Penna MD (PCP) - seen for UTI and other issues. Patient started on ciprofloxacin 581m orally two times daily. No follow up noted.   Recent consult visits:  07/07/20 Cheyenne BishopMD (Cardiology) - seen for shortness of breath and other issues. Discontinued ciprofloxacin 5070m- two times daily.follow up in 6 months and if symptoms worsen will consider right heart cath.   05/31/20 Cheyenne MccreedyP (Pain Management) -  seen for televisit for medication management and chronic pain. No medication changes and follow up with rheumatology.   Hospital visits:  None in previous 6 months  Medications: Outpatient Encounter Medications as of 08/02/2020  Medication Sig   albuterol (PROVENTIL) (2.5 MG/3ML) 0.083% nebulizer solution 1 VIAL IN NEBULIZER EVERY 4 HOURS AS NEEDED FOR WHEEZING (Patient taking differently: Take 2.5 mg by nebulization every 4 (four) hours as needed for wheezing.)   albuterol (VENTOLIN HFA) 108 (90 Base) MCG/ACT inhaler Inhale 2 puffs into the lungs every 4 (four) hours as needed for wheezing or shortness of breath.   ALPRAZolam (XANAX) 1 MG tablet TAKE 1 TABLET BY MOUTH THREE TIMES A DAY   atorvastatin (LIPITOR) 10 MG tablet Take 1 tablet (10 mg total) by mouth daily.   Blood Glucose Monitoring Suppl (CONTOUR NEXT MONITOR) w/Device KIT 1 each by Does not apply route 2 (two) times daily. To check blood sugars twice a day.   Buprenorphine 15 MCG/HR PTWK Place 1 patch onto the skin every Saturday.    cephALEXin (KEFLEX) 500 MG capsule Take 1 capsule three times daily for 10 days   furosemide (LASIX) 40 MG tablet TAKE 1 TABLET BY MOUTH TWICE A DAY   gabapentin (NEURONTIN) 300 MG capsule Take 300 mg by mouth 2 (two) times  daily.    glucose blood (CONTOUR NEXT TEST) test strip Use to test blood sugar 2 times daily   glucose blood test strip Use as instructed   HUMALOG MIX 75/25 (75-25) 100 UNIT/ML SUSP injection INJECT 110 UNITS INTO THE SKIN 2 TIMES DAILY WITH A MEAL. (Patient taking differently: INJECT 120 UNITS INTO THE SKIN 2 TIMES DAILY WITH A MEAL.)   Insulin Syringe-Needle U-100 (INSULIN SYRINGE 1CC/31GX5/16") 31G X 5/16" 1 ML MISC 2x daily   levothyroxine (SYNTHROID) 75 MCG tablet TAKE 1 TABLET BY MOUTH EVERY DAY   lidocaine (XYLOCAINE) 5 % ointment Apply to areas of pain up to bid.   metFORMIN (GLUCOPHAGE) 1000 MG tablet Take 1 tablet (1,000 mg total) by mouth daily with breakfast.   methocarbamol (ROBAXIN) 750 MG tablet Take 1 tablet (750 mg total) by mouth 2 (two) times daily.   Multiple Vitamin (MULTIVITAMIN WITH MINERALS) TABS tablet Take 1 tablet by mouth daily.   pramipexole (MIRAPEX) 0.5 MG tablet TAKE 1 TABLET BY MOUTH EVERYDAY AT BEDTIME   Semaglutide,0.25 or 0.5MG/DOS, (OZEMPIC, 0.25 OR 0.5 MG/DOSE,) 2 MG/1.5ML SOPN Inject 0.375 mLs (0.5 mg total) into the skin once a week. (Patient not taking: Reported on 07/07/2020)   sertraline (ZOLOFT) 100 MG tablet TAKE 2 TABLETS (200 MG TOTAL) BY MOUTH AT BEDTIME.   XARELTO 20 MG TABS tablet TAKE 1 TABLET BY MOUTH EVERY DAY   No facility-administered encounter medications on file as of 08/02/2020.   Recent Relevant Labs: Lab Results  Component Value Date/Time   HGBA1C 9.2 (H) 02/19/2020 04:11 PM   HGBA1C 10.1 (A) 06/19/2019 02:50 PM   HGBA1C 8.7 (A) 11/05/2018 04:13 PM   HGBA1C 8.4 (H) 07/31/2018 04:55 AM   MICROALBUR 15.7 (H) 11/05/2018 04:11 PM   MICROALBUR 1.5 04/14/2009 11:39 AM    Kidney Function Lab Results  Component Value Date/Time   CREATININE 0.92 02/19/2020 04:11 PM   CREATININE 0.92 04/09/2019 01:27 PM   CREATININE 1.02 (H) 11/11/2018 09:38 PM   CREATININE 0.93 02/01/2012 01:17 PM   GFR 56.49 (L) 11/05/2018 04:11 PM   GFRNONAA >60  04/09/2019 01:27 PM   GFRAA >60 04/09/2019 01:27 PM    Current antihyperglycemic regimen:  Humalog mix 75/25 inject 110 units twice daily 120 units and 130 units in evening Metformin 1000 mg daily Ozempic 0.5 mg inject once weekly - not started What recent interventions/DTPs have been made to improve glycemic control:  None.  Have there been any recent hospitalizations or ED visits since last visit with CPP? No Patient denies hypoglycemic symptoms, including None Patient reports hyperglycemic symptoms, including blurry vision, excessive thirst, and polyuria How often are you checking your blood sugar? Infrequently.  What are your blood sugars ranging? Between 195-240 fasting.  Fasting: between 195-240.   During the week, how often does your blood glucose drop below 70? Never Are you checking your feet daily/regularly? Yes. Patient checks daily when she puts her compression socks on.   Adherence Review: Is the patient currently on a STATIN medication? Yes Is the patient currently on ACE/ARB medication? No Does the patient have >5 day gap between last estimated fill dates? No  Notes: Spoke with patient and reviewed all medications as prescribed. Patient reports taking all medications except for she never started the ozempic and she was denied patient assistance for the ozempic and xarelto. Patient completed her antibiotic course of cephalexin. Patient reports no issues from medications at this time. Patient stated that she does check her sugar but it is infrequently and she does not keep a log. Patient tends to run higher numbers fasting and states her sugar has always ran the same. I encouraged patient to start keeping a log and checking her sugar daily. Patient was agreeable. Patient stated for breakfast this morning she had 2 packs of light brown sugar oatmeal and half of a banana with a cup of coffee. For lunch today she had a salad with peppers and cucumber and light balsamic vinaigrette  dressing and two slices of pizza. Patient states that she does eat out for dinner quite a bit unless one of her daughters cooks dinner. She likes to eat [place] and hibachi. Tonight she stated her daughter is cooking grilled vegetables on their blackstone and some rotisserie chicken with low carb tortilla wraps. Patient stated she does not drink enough water but she has about two 16 ounce bottles with a flavor packet everyday. She likes to have a diet soda with dinner. She will have a bowl of ice cream if she has a light dinner. Patient stated she is not able to get much activity but she does try to get in her pool everyday in the summer and move around. Patient has a lot of fluid and lymphedema in her legs and it makes it hard to balance. Patient has a floor peddle bike that she uses sometimes while sitting. Patients daughter help her around the house and getting her into town. Patient stated that she was getting a catastrophic medicare assistance to  only pay 35 a month for her Humalog and that she was denied recently and is now paying 138.19 a month for the remainder of the year. Patient has been denied patient assistance.  I scheduled patient a follow up appointment with Jeni Salles the clinical pharmacist for 11/01/20 at 2pm via telephone. Patient thanked me for my call.   Care Gaps:  Ophthalmology exam - never done Hepatitis C screening - never done Tetanus/TDAP - never done Zoster vaccines-SHINGRIX- never done PAP smear - never done Colonoscopy - never done Pnemococcal vaccine - overdue since 11/28/13 Yearly foot exam - overdue since 10/17/18 Urine microalbumin - overdue since 12/06/19 COVID-19 shot 4 - overdue since 05/21/20  Star Rating Drugs:  Atorvastatin 182m - last filled on 07/23/20 [facility] Metformin 10037m- last filled on 06/09/20 [facility] at [facility] Semaglutide 0.25 or 0.82m14m no history in chart.   [place] Heightslinical Pharmacist Assistant ([phone]

## 2020-08-05 NOTE — Telephone Encounter (Cosign Needed)
2nd attempt

## 2020-08-07 ENCOUNTER — Other Ambulatory Visit: Payer: Self-pay | Admitting: Family Medicine

## 2020-08-09 ENCOUNTER — Telehealth (INDEPENDENT_AMBULATORY_CARE_PROVIDER_SITE_OTHER): Payer: Medicare Other | Admitting: Cardiovascular Disease

## 2020-08-09 ENCOUNTER — Encounter: Payer: Self-pay | Admitting: Cardiovascular Disease

## 2020-08-09 ENCOUNTER — Telehealth: Payer: Self-pay

## 2020-08-09 VITALS — BP 127/73 | HR 92 | Ht 67.5 in

## 2020-08-09 DIAGNOSIS — Z6841 Body Mass Index (BMI) 40.0 and over, adult: Secondary | ICD-10-CM

## 2020-08-09 DIAGNOSIS — E1169 Type 2 diabetes mellitus with other specified complication: Secondary | ICD-10-CM

## 2020-08-09 DIAGNOSIS — G4733 Obstructive sleep apnea (adult) (pediatric): Secondary | ICD-10-CM

## 2020-08-09 DIAGNOSIS — I2782 Chronic pulmonary embolism: Secondary | ICD-10-CM | POA: Diagnosis not present

## 2020-08-09 DIAGNOSIS — E669 Obesity, unspecified: Secondary | ICD-10-CM

## 2020-08-09 NOTE — Telephone Encounter (Signed)
Called patient to review after visit summary, pt aware she will be able to see AVS on MyChart. All questions/concerns addressed at this time.

## 2020-08-09 NOTE — Progress Notes (Signed)
Virtual Visit via Video Note   This visit type was conducted due to national recommendations for restrictions regarding the COVID-19 Pandemic (e.g. social distancing) in an effort to limit this patient's exposure and mitigate transmission in our community.  Due to her co-morbid illnesses, this patient is at least at moderate risk for complications without adequate follow up.  This format is felt to be most appropriate for this patient at this time.  All issues noted in this document were discussed and addressed.  A limited physical exam was performed with this format.  Please refer to the patient's chart for her consent to telehealth for Cheyenne Guzman.  Video Connection Lost Video connection was lost at the beginning of this visit, at which time the remainder of the visit was completed via audio only.      Date:  08/11/2020   ID:  Cheyenne Guzman, DOB [birth year], MRN [id number] The patient was identified using 2 identifiers.  Patient Location: Home Provider Location: Office/Clinic   PCP:  Laurey Morale, MD   [facility] Providers Cardiologist:  None }    Evaluation Performed:  New sleep evaluation  Chief Complaint: Obstructive sleep apnea  History of Present Illness:    Cheyenne Guzman is a 54 y.o. female with is followed by Dr. Debara Pickett for cardiology care and sees Dr. [name] for primary care.  She has a history of super morbid obesity, dyspnea on exertion with grade 2 diastolic dysfunction, diabetes mellitus, and had suffered PE x2 for which she is on chronic Xarelto.  Due to symptoms of chronic fatigue, snoring, daytime sleepiness, and frequent awakenings and was referred for a split-night sleep study which was done on May 31, 2020.  This study demonstrated severe overall sleep apnea with an AHI of 56.8 times per hour.  Events were even more severe during REM sleep with an AHI of 79.3/h.  On the baseline portion of the study she had significant oxygen desaturation to a nadir of  70%.  Time below 88% was 28.1 minutes.  She had moderate snoring during the diagnostic portion of her study.  CPAP was instituted at 7 cm and was titrated to maximum pressure at 20 cm of water.  At 19 cm, AHI is still was elevated at 7.3/h and at 20 cm was 0 with 22 minutes of sleep and 5 minutes of REM sleep at 20 cm.  Cheyenne Guzman initiated CPAP therapy on Jun 16, 2020 with [facility] as her DME company.  She has noted that she feels much Cheyenne Guzman but is probably 60% improved from prior to initiating therapy.  Unfortunately, her sleep duration is still limited.  She typically goes to bed between midnight and 1 AM and usually wakes up after approximately 4 hours.  She then usually stays awake for approximately 2 hours before trying to go back to sleep.  Since initiating CPAP therapy, she has significantly reduced her previous daytime napping.  She has numerous comorbidities which have limited her ability to ambulate well including her super morbid obesity, back discomfort, rheumatoid arthritis, fibromyalgia.  She is on alprazolam for anxiety.  Atorvastatin 10 mg for hyperlipidemia.  She is on furosemide 40 mg twice a day for edema and blood pressure.  She has a history of hypothyroidism on levothyroxine 75 mcg.  She is diabetic on insulin.  I had initially requested an in office visit.  However, the patient states that her mobility is very poor and she was specific and only  wanting a video or virtual visit.  Able to connect initially video wise but due to video malfunctions, this was ultimately transitioned to a phone visit.  The patient does not have symptoms concerning for COVID-19 infection (fever, chills, cough, or new shortness of breath).    Past Medical History:  Diagnosis Date   Anxiety    per pt. - mild    Arthritis    back & ankles    Asthma    seasonal    Breast cancer ([place])    Cancer ([place])    breast, sees Dr. Jana Hakim , Left - 11/2007   Cellulitis 2020   severe - Left leg     Complication of anesthesia 2013   severe muscle spasms-sore-no doc   Depression    Diabetes mellitus    sees Dr. Chalmers Cater, diagnosed 2003   HA (headache)    Hearing loss    Went to [facility],Dr Laing,both ears   Hyperlipidemia    Kidney infection    - hosp. [facility]- septic- 10/2009   Low back pain    Peripheral neuropathy    R sided numbness- face & jaw   Pulmonary embolism ([place]) 2020   Sepsis ([place]) 2020   Sleep apnea 9/15   severe-just started cpap-doing well   TIA (transient ischemic attack)    in 2007,2008, and 2011   Tinnitus of right ear    Urosepsis 08-2009   with Klebsiella   Past Surgical History:  Procedure Laterality Date   ABDOMINAL HYSTERECTOMY  08/2004   BREAST BIOPSY  06/22/2011   Procedure: BREAST BIOPSY;  Surgeon: Stark Klein, MD;  Location: [place];  Service: General;  Laterality: Right;   BREAST LUMPECTOMY     BREAST SURGERY  11/2007   left ductal carcinoma in situ   CARPAL TUNNEL RELEASE Right 10/13/2013   Procedure: RIGHT CARPAL TUNNEL RELEASE;  Surgeon: Hessie Dibble, MD;  Location: [place];  Service: Orthopedics;  Laterality: Right;   CARPAL TUNNEL RELEASE Left 11/24/2013   Procedure: LEFT CARPAL TUNNEL RELEASE WITH CYST EXCISION ;  Surgeon: Hessie Dibble, MD;  Location: [place];  Service: Orthopedics;  Laterality: Left;   EAR CYST EXCISION Left 11/24/2013   Procedure: CYST REMOVAL;  Surgeon: Hessie Dibble, MD;  Location: [place];  Service: Orthopedics;  Laterality: Left;   FOOT SURGERY  2000   plantar fasciitis-left   LUMBAR DISC SURGERY  03-29-14   Fusion, revision 04-27-14 L4-5 [place] with graft   NERVE GRAFT  06-01-08   cadaver graft to right inferior alveolar nerve  in [place]     Current Meds  Medication Sig   albuterol (PROVENTIL) (2.5 MG/3ML) 0.083% nebulizer solution 1 VIAL IN NEBULIZER EVERY 4 HOURS AS NEEDED FOR WHEEZING (Patient taking differently: Take 2.5  mg by nebulization every 4 (four) hours as needed for wheezing.)   albuterol (VENTOLIN HFA) 108 (90 Base) MCG/ACT inhaler Inhale 2 puffs into the lungs every 4 (four) hours as needed for wheezing or shortness of breath.   ALPRAZolam (XANAX) 1 MG tablet TAKE 1 TABLET BY MOUTH THREE TIMES A DAY (Patient taking differently: Patient takes as needed)   atorvastatin (LIPITOR) 10 MG tablet Take 1 tablet (10 mg total) by mouth daily.   Blood Glucose Monitoring Suppl (CONTOUR NEXT MONITOR) w/Device KIT 1 each by Does not apply route 2 (two) times daily. To check blood sugars twice a day.   Buprenorphine 15  MCG/HR PTWK Place 1 patch onto the skin every Saturday.    furosemide (LASIX) 40 MG tablet TAKE 1 TABLET BY MOUTH TWICE A DAY   gabapentin (NEURONTIN) 300 MG capsule Take 300 mg by mouth 2 (two) times daily.    glucose blood (CONTOUR NEXT TEST) test strip Use to test blood sugar 2 times daily   HUMALOG MIX 75/25 (75-25) 100 UNIT/ML SUSP injection INJECT 110 UNITS INTO THE SKIN 2 TIMES DAILY WITH A MEAL. (Patient taking differently: INJECT 120 UNITS INTO THE SKIN 2 TIMES DAILY WITH A MEAL.)   Insulin Syringe-Needle U-100 (INSULIN SYRINGE 1CC/31GX5/16") 31G X 5/16" 1 ML MISC 2x daily   levothyroxine (SYNTHROID) 75 MCG tablet TAKE 1 TABLET BY MOUTH EVERY DAY   lidocaine (XYLOCAINE) 5 % ointment Apply to areas of pain up to bid.   metFORMIN (GLUCOPHAGE) 1000 MG tablet Take 1 tablet (1,000 mg total) by mouth daily with breakfast.   methocarbamol (ROBAXIN) 750 MG tablet Take 1 tablet (750 mg total) by mouth 2 (two) times daily.   Multiple Vitamin (MULTIVITAMIN WITH MINERALS) TABS tablet Take 1 tablet by mouth daily.   pramipexole (MIRAPEX) 0.5 MG tablet TAKE 1 TABLET BY MOUTH EVERYDAY AT BEDTIME   sertraline (ZOLOFT) 100 MG tablet TAKE 2 TABLETS (200 MG TOTAL) BY MOUTH AT BEDTIME.   XARELTO 20 MG TABS tablet TAKE 1 TABLET BY MOUTH EVERY DAY     Allergies:   Vicodin [hydrocodone-acetaminophen]   Social  History   Tobacco Use   Smoking status: Former    Types: Cigarettes    Quit date: 02/19/1992    Years since quitting: 28.4   Smokeless tobacco: Never  Vaping Use   Vaping Use: Never used  Substance Use Topics   Alcohol use: Not Currently    Comment: rare   Drug use: No     Family Hx: The patient's family history includes Cancer in her father, maternal aunt, and mother; Cancer (age of onset: 75) in her sister; Diabetes in her father and sister. There is no history of Anesthesia problems.  ROS:   Please see the history of present illness.    Super morbid obesity, Fatigability, Back discomfort No chest pain Rheumatoid arthritis Myalgia Migraine headaches Diabetes mellitus Gait instability Sleep apnea All other systems reviewed and are negative.   Prior CV studies:   The following studies were reviewed today:  ECHO: 05/02/2020 IMPRESSIONS    1. Left ventricular ejection fraction, by estimation, is 55 to 60%. The  left ventricle has normal function. The left ventricle has no regional  wall motion abnormalities. Left ventricular diastolic parameters are  consistent with Grade II diastolic  dysfunction (pseudonormalization).   2. Right ventricular systolic function is normal. The right ventricular  size is normal. Tricuspid regurgitation signal is inadequate for assessing  PA pressure.   3. The mitral valve is normal in structure. No evidence of mitral valve  regurgitation. No evidence of mitral stenosis.   4. The aortic valve was not well visualized. Aortic valve regurgitation  is not visualized. No aortic stenosis is present.   5. The IVC was not visualized.   6. Technically difficult study with poor acoustic windows.   Labs/Other Tests and Data Reviewed:    EKG:  An ECG dated 04/05/2020 was personally reviewed today and demonstrated:    Normal sinus rhythm at 87 bpm, QS complex V1 V2.  Recent Labs: 02/19/2020: ALT 16; Brain Natriuretic Peptide 18; BUN 17; Creat  0.92; Potassium 4.4; Sodium 140;  TSH 3.45   Recent Lipid Panel Lab Results  Component Value Date/Time   CHOL 162 05/19/2020 11:48 AM   TRIG 108.0 05/19/2020 11:48 AM   HDL 50.70 05/19/2020 11:48 AM   CHOLHDL 3 05/19/2020 11:48 AM   LDLCALC 89 05/19/2020 11:48 AM   LDLDIRECT 151.0 11/05/2018 04:11 PM    Wt Readings from Last 3 Encounters:  07/07/20 (!) 440 lb (199.6 kg)  05/31/20 (!) 440 lb (199.6 kg)  05/19/20 (!) 444 lb (201.4 kg)         Objective:    Vital Signs:  BP 127/73 (BP Location: Left Arm, Patient Position: Sitting, Cuff Size: Normal)   Pulse 92   Ht 5' 7.5" (1.715 m)   SpO2 95%   BMI 67.90 kg/m    During the brief video portion, the patient did not appear to be in acute distress.  She is morbidly obese. Breathing was normal.  I did not hear any audible wheezing or see visual respiratory difficulty. Skin appeared normal color. She had normal affect and mood   ASSESSMENT & PLAN:    1. OSA (obstructive sleep apnea)   2. Morbid obesity with body mass index of 60.0-69.9 in adult ([place])   3. Diabetes mellitus type 2 in obese (H[facility])   4. Other chronic pulmonary embolism without acute cor pulmonale ([place])     Ms. Mishaal Lansdale is a 55 year old female who has a history of super morbid obesity with a BMI of 67.9, difficulty with ambulation secondary to chronic back discomfort, rheumatoid arthritis, fibromyalgia, has a history of hypothyroidism, insulin-dependent diabetes mellitus, hyperlipidemia, and prior pulmonary embolism x2 for which she is on chronic anticoagulation.  I had a thorough evaluation with her today regarding her sleep apnea.  I reviewed her sleep study in detail which demonstrated very severe overall sleep apnea with an AHI of 56.8/h and with the events being more severe during REM sleep with an AHI of 79.3/h.  She had significant oxygen desaturation to a nadir of 70% and spent almost 30 minutes with time below 88%.  She required CPAP titration up to its  maximum of 20 cm of water and finally at 20 cm achieved very good benefit.  I had a long discussion with her today.  I reviewed her sleep study data.  I also reviewed the adverse consequences of untreated sleep apnea with reference to her cardiovascular health.  I specifically discussed its role in hypertension, potential nocturnal arrhythmias, hypoxia immediate potential nocturnal ischemia, as well as its effects on increased nocturia and specifically discussed the pathophysiology associated with this.  We also discussed its role in increased inflammation, adverse glucose effects in addition to GERD.  He has felt significantly improved since initiating CPAP therapy.  However her on her current download she is meeting compliance standards with reference to usage days at 97%, usage days greater than 4 hours was just 21 of 30 days with average use at only 4 hours and 58 minutes per night.  I suspect her sleep duration is still playing a significant role in why she has not achieved a even greater feeling of benefit.  I discussed with her optimal sleep duration and an adult is 7 to 9 hours.  We also discussed that the preponderance of REM sleep occurs in the second half of the night and the importance of using CPAP therapy throughout the nights duration.  I also discussed that she is on maximum CPAP pressure and if higher pressure is necessary she will  need to be transitioned to BiPAP therapy.  The importance of weight loss and exercise with reference to her cardiovascular health as well as potential benefit with her sleep apnea.    COVID-19 Education: The signs and symptoms of COVID-19 were discussed with the patient and how to seek care for testing (follow up with PCP or arrange E-visit).  The importance of social distancing was discussed today.  Time:   Today, I have spent 35 minutes with the patient with telehealth technology discussing the above problems.     Medication Adjustments/Labs and Tests  Ordered: Current medicines are reviewed at length with the patient today.  Concerns regarding medicines are outlined above.   Tests Ordered: No orders of the defined types were placed in this encounter.   Medication Changes: No orders of the defined types were placed in this encounter.   Follow Up:  one month, virtual  Signed, Shelva Majestic, MD  08/11/2020 2:23 PM    [place]

## 2020-08-09 NOTE — Patient Instructions (Signed)
Medication Instructions:  Your physician recommends that you continue on your current medications as directed. Please refer to the Current Medication list given to you today.  *If you need a refill on your cardiac medications before your next appointment, please call your pharmacy*   Lab Work: None ordered.     Testing/Procedures: None ordered.    Follow-Up: At [facility], you and your health needs are our priority.  As part of our continuing mission to provide you with exceptional heart care, we have created designated Provider Care Teams.  These Care Teams include your primary Cardiologist (physician) and Advanced Practice Providers (APPs -  Physician Assistants and Nurse Practitioners) who all work together to provide you with the care you need, when you need it.  We recommend signing up for the patient portal called "MyChart".  Sign up information is provided on this After Visit Summary.  MyChart is used to connect with patients for Virtual Visits (Telemedicine).  Patients are able to view lab/test results, encounter notes, upcoming appointments, etc.  Non-urgent messages can be sent to your provider as well.   To learn more about what you can do with MyChart, go to [email or web address].    Your next appointment:   1 month(s)  The format for your next appointment:   In Person  Provider:   Shelva Majestic, MD

## 2020-08-11 ENCOUNTER — Encounter: Payer: Self-pay | Admitting: Cardiovascular Disease

## 2020-08-11 ENCOUNTER — Telehealth: Payer: Self-pay | Admitting: *Deleted

## 2020-08-11 NOTE — Telephone Encounter (Signed)
Request for surgical clearance:     Endoscopy Procedure  What type of surgery is being performed?     colonoscopy  When is this surgery scheduled?     TBD  What type of clearance is required ?   Pharmacy/Medical  Is patient cleared from a cardiac standpoint to go forward with procedure?  Are there any medications that need to be held prior to surgery and how long? Xarelto, 2 days  Practice name and name of physician performing surgery?      [facility]  What is your office phone and fax number?      Phone- [phone]  [phone]  Anesthesia type (None, local, MAC, general) ?       MAC

## 2020-08-12 NOTE — Telephone Encounter (Signed)
   Patient Name: Cheyenne Guzman  DOB: 1965-12-08 MRN: [id number]  Primary Cardiologist: None  Chart reviewed as part of pre-operative protocol coverage.   Routing back to preop callback pool  - Please call the requesting office and ask that they send the preoperative pharmacy clearance request to the PCP, as he has been prescribing the Xarelto for PE.  Arvil Chaco, PA-C 08/12/2020, 5:03 PM

## 2020-08-15 NOTE — Telephone Encounter (Signed)
Ms.Doner has now been given cardiac clearance and Xarelto clearance for colonoscopy we previously recommended at her visit on 03/28/20. She will need to be a hospital case due to BMI. Do you want me to use another purple day? 10/14/20?

## 2020-08-15 NOTE — Telephone Encounter (Signed)
Routed the surgical clearance back to [facility] so they can send to pt's PCP for clearance since they have been prescribing the Xarelto.

## 2020-08-15 NOTE — Telephone Encounter (Signed)
Yes she may hold her anticoagulant (Xarelto) for 2 days prior to her colonoscopy.

## 2020-08-15 NOTE — Telephone Encounter (Signed)
   Cheyenne Guzman 11/11/1965 [id number]  Dear Dr Sarajane Jews:  We would like to schedule the above named patient for a(n) colonoscopy procedure. Our records show that (s)he is on anticoagulation therapy.  Please advise as to whether the patient may come off their therapy of Xarelto 2 days prior to their procedure with date to be determined.  Please route your response to Dixon Boos, CMA.  Sincerely,    [facility]

## 2020-08-15 NOTE — Telephone Encounter (Signed)
   Primary Cardiologist: Pixie Casino, MD  Chart reviewed as part of pre-operative protocol coverage. Given past medical history and time since last visit, based on ACC/AHA guidelines, Cheyenne Guzman would be at acceptable risk for the planned procedure without further cardiovascular testing.   Recommendations for holding Xarelto will need to be provided by her PCP.  I will route this recommendation to the requesting party via Epic fax function and remove from pre-op pool.  Please call with questions.  [name]. Haneen Bernales NP-C    08/15/2020, 8:05 AM [place] [address] Office [phone] Fax [phone]

## 2020-08-15 NOTE — Telephone Encounter (Signed)
Would use next available hospital yellow block slot for me, could ask another [facility] provider if patient agreeable If another provider available they can/should review my office note from March 2022 Thanks JMP

## 2020-08-16 ENCOUNTER — Other Ambulatory Visit: Payer: Self-pay | Admitting: Internal Medicine

## 2020-08-16 DIAGNOSIS — Z86010 Personal history of colonic polyps: Secondary | ICD-10-CM

## 2020-08-16 DIAGNOSIS — G4733 Obstructive sleep apnea (adult) (pediatric): Secondary | ICD-10-CM | POA: Diagnosis not present

## 2020-08-16 DIAGNOSIS — Z800 Family history of malignant neoplasm of digestive organs: Secondary | ICD-10-CM

## 2020-08-16 NOTE — Telephone Encounter (Signed)
I have left a voicemail for patient to call back. In speaking with Dr Hilarie Fredrickson after he returned my note, he indicates that his preference would be for patient to go ahead and get colonoscopy at the soonest available time with any provider due to her specific family history and her previous history of 1 cm colon polyp.

## 2020-08-16 NOTE — Telephone Encounter (Signed)
Patient indicates that she is agreeable to having her colonoscopy procedure completed at the soonest available slot. Patient has been scheduled with Dr Ketchum Cellar at [facility] on 10/10/20 at 32 am, 800 am arrival. She has also been scheduled for previsit on 09/29/20 at 4:30 pm.

## 2020-08-17 NOTE — Telephone Encounter (Signed)
Patient has been advised of time/date/location of appointments for colonoscopy and previsit. She verbalizes understanding.

## 2020-08-31 DIAGNOSIS — E559 Vitamin D deficiency, unspecified: Secondary | ICD-10-CM | POA: Diagnosis not present

## 2020-08-31 DIAGNOSIS — M5441 Lumbago with sciatica, right side: Secondary | ICD-10-CM | POA: Diagnosis not present

## 2020-08-31 DIAGNOSIS — Z79899 Other long term (current) drug therapy: Secondary | ICD-10-CM | POA: Diagnosis not present

## 2020-08-31 DIAGNOSIS — M7918 Myalgia, other site: Secondary | ICD-10-CM | POA: Diagnosis not present

## 2020-08-31 DIAGNOSIS — G8929 Other chronic pain: Secondary | ICD-10-CM | POA: Diagnosis not present

## 2020-08-31 DIAGNOSIS — M5442 Lumbago with sciatica, left side: Secondary | ICD-10-CM | POA: Diagnosis not present

## 2020-09-01 ENCOUNTER — Telehealth: Payer: Self-pay | Admitting: Pharmacist

## 2020-09-01 NOTE — Chronic Care Management (AMB) (Signed)
Chronic Care Management Pharmacy Assistant   Name: Cheyenne Guzman  MRN: [id number] DOB: Aug 19, 1965  Spoke with patient per Jeni Salles to see if patient has filled out Ozempic patient assistance application. Patient states she has not completed the application yet due to having to make an appointment with her endocrinologist the medication prescriber first before they will complete their portions of her application. Patient also stated she wants an application for her Humalog 75/25 as well. Patient will be making an appointment to see her endocrinologist and will take both applications with her to the appointment to be completed and faxed to [facility]. I filled out an application for patients Humalog and will print and mail to patient on 09/02/20. Patient thanked em for my call.  Medications: Outpatient Encounter Medications as of 09/01/2020  Medication Sig Note   albuterol (PROVENTIL) (2.5 MG/3ML) 0.083% nebulizer solution 1 VIAL IN NEBULIZER EVERY 4 HOURS AS NEEDED FOR WHEEZING (Patient taking differently: Take 2.5 mg by nebulization every 4 (four) hours as needed for wheezing.)    albuterol (VENTOLIN HFA) 108 (90 Base) MCG/ACT inhaler Inhale 2 puffs into the lungs every 4 (four) hours as needed for wheezing or shortness of breath.    ALPRAZolam (XANAX) 1 MG tablet TAKE 1 TABLET BY MOUTH THREE TIMES A DAY (Patient taking differently: Patient takes as needed)    atorvastatin (LIPITOR) 10 MG tablet Take 1 tablet (10 mg total) by mouth daily.    Blood Glucose Monitoring Suppl (CONTOUR NEXT MONITOR) w/Device KIT 1 each by Does not apply route 2 (two) times daily. To check blood sugars twice a day.    Buprenorphine 15 MCG/HR PTWK Place 1 patch onto the skin every Saturday.     cephALEXin (KEFLEX) 500 MG capsule Take 1 capsule three times daily for 10 days (Patient not taking: Reported on 08/09/2020)    furosemide (LASIX) 40 MG tablet TAKE 1 TABLET BY MOUTH TWICE A DAY     gabapentin (NEURONTIN) 300 MG capsule Take 300 mg by mouth 2 (two) times daily.     glucose blood (CONTOUR NEXT TEST) test strip Use to test blood sugar 2 times daily    glucose blood test strip Use as instructed    HUMALOG MIX 75/25 (75-25) 100 UNIT/ML SUSP injection INJECT 110 UNITS INTO THE SKIN 2 TIMES DAILY WITH A MEAL. (Patient taking differently: INJECT 120 UNITS INTO THE SKIN 2 TIMES DAILY WITH A MEAL.)    Insulin Syringe-Needle U-100 (INSULIN SYRINGE 1CC/31GX5/16") 31G X 5/16" 1 ML MISC 2x daily    levothyroxine (SYNTHROID) 75 MCG tablet TAKE 1 TABLET BY MOUTH EVERY DAY    lidocaine (XYLOCAINE) 5 % ointment Apply to areas of pain up to bid. 08/09/2020: Patient uses as needed   metFORMIN (GLUCOPHAGE) 1000 MG tablet Take 1 tablet (1,000 mg total) by mouth daily with breakfast.    methocarbamol (ROBAXIN) 750 MG tablet Take 1 tablet (750 mg total) by mouth 2 (two) times daily.    Multiple Vitamin (MULTIVITAMIN WITH MINERALS) TABS tablet Take 1 tablet by mouth daily.    pramipexole (MIRAPEX) 0.5 MG tablet TAKE 1 TABLET BY MOUTH EVERYDAY AT BEDTIME    Semaglutide,0.25 or 0.5MG/DOS, (OZEMPIC, 0.25 OR 0.5 MG/DOSE,) 2 MG/1.5ML SOPN Inject 0.375 mLs (0.5 mg total) into the skin once a week. (Patient not taking: No sig reported)    sertraline (ZOLOFT) 100 MG tablet TAKE 2 TABLETS (200 MG TOTAL) BY MOUTH AT BEDTIME.    Alveda Reasons  20 MG TABS tablet TAKE 1 TABLET BY MOUTH EVERY DAY    No facility-administered encounter medications on file as of 09/01/2020.   Care Gaps:  AWV -  scheduled for 02/13/21 Ophthalmology exam - never done Hepatitis C Screening - never done Tetanus/TDAP - never done Zoster vaccines - never done Pap smear - never done Colonoscopy - never done Pneumonia vaccine - overdue since 2015 Foot exam - overdue since 2020 Urine microalbumin - overdue since 2021 Covid-19 vaccine booster 4 - overdue since 05/21/20 Hemoglobin A1C -  overdue since 08/18/20 Influenza vaccine - due  [facility]  Rating Drugs:  Atorvastatin 48m - last filled on 07/23/20 [facility] Metformin 10044m- [facility]20 [facility] ([phone]

## 2020-09-16 ENCOUNTER — Other Ambulatory Visit: Payer: Self-pay | Admitting: Family Medicine

## 2020-09-16 ENCOUNTER — Other Ambulatory Visit: Payer: Self-pay | Admitting: Internal Medicine

## 2020-09-16 ENCOUNTER — Encounter (HOSPITAL_BASED_OUTPATIENT_CLINIC_OR_DEPARTMENT_OTHER): Payer: Self-pay

## 2020-09-16 ENCOUNTER — Emergency Department (HOSPITAL_BASED_OUTPATIENT_CLINIC_OR_DEPARTMENT_OTHER): Payer: Medicare Other

## 2020-09-16 ENCOUNTER — Other Ambulatory Visit: Payer: Self-pay

## 2020-09-16 ENCOUNTER — Emergency Department (HOSPITAL_BASED_OUTPATIENT_CLINIC_OR_DEPARTMENT_OTHER)
Admission: EM | Admit: 2020-09-16 | Discharge: 2020-09-16 | Disposition: A | Discharge status: Home / Self Care | Payer: Medicare Other | Source: Home / Self Care | Attending: Emergency Medicine | Admitting: Emergency Medicine

## 2020-09-16 DIAGNOSIS — M791 Myalgia, unspecified site: Secondary | ICD-10-CM

## 2020-09-16 DIAGNOSIS — L03311 Cellulitis of abdominal wall: Secondary | ICD-10-CM | POA: Diagnosis not present

## 2020-09-16 DIAGNOSIS — Z833 Family history of diabetes mellitus: Secondary | ICD-10-CM | POA: Diagnosis not present

## 2020-09-16 DIAGNOSIS — H9190 Unspecified hearing loss, unspecified ear: Secondary | ICD-10-CM | POA: Diagnosis not present

## 2020-09-16 DIAGNOSIS — Z87891 Personal history of nicotine dependence: Secondary | ICD-10-CM | POA: Insufficient documentation

## 2020-09-16 DIAGNOSIS — J45909 Unspecified asthma, uncomplicated: Secondary | ICD-10-CM | POA: Insufficient documentation

## 2020-09-16 DIAGNOSIS — R6510 Systemic inflammatory response syndrome (SIRS) of non-infectious origin without acute organ dysfunction: Secondary | ICD-10-CM | POA: Diagnosis not present

## 2020-09-16 DIAGNOSIS — R000 Tachycardia, unspecified: Secondary | ICD-10-CM | POA: Diagnosis not present

## 2020-09-16 DIAGNOSIS — E1169 Type 2 diabetes mellitus with other specified complication: Secondary | ICD-10-CM | POA: Insufficient documentation

## 2020-09-16 DIAGNOSIS — F32A Depression, unspecified: Secondary | ICD-10-CM | POA: Diagnosis not present

## 2020-09-16 DIAGNOSIS — Z6841 Body Mass Index (BMI) 40.0 and over, adult: Secondary | ICD-10-CM | POA: Diagnosis not present

## 2020-09-16 DIAGNOSIS — A419 Sepsis, unspecified organism: Secondary | ICD-10-CM | POA: Diagnosis not present

## 2020-09-16 DIAGNOSIS — M009 Pyogenic arthritis, unspecified: Secondary | ICD-10-CM | POA: Diagnosis not present

## 2020-09-16 DIAGNOSIS — R109 Unspecified abdominal pain: Secondary | ICD-10-CM | POA: Diagnosis not present

## 2020-09-16 DIAGNOSIS — R112 Nausea with vomiting, unspecified: Secondary | ICD-10-CM | POA: Diagnosis not present

## 2020-09-16 DIAGNOSIS — R162 Hepatomegaly with splenomegaly, not elsewhere classified: Secondary | ICD-10-CM | POA: Diagnosis not present

## 2020-09-16 DIAGNOSIS — R600 Localized edema: Secondary | ICD-10-CM | POA: Diagnosis not present

## 2020-09-16 DIAGNOSIS — R1032 Left lower quadrant pain: Secondary | ICD-10-CM | POA: Diagnosis not present

## 2020-09-16 DIAGNOSIS — Z7901 Long term (current) use of anticoagulants: Secondary | ICD-10-CM | POA: Insufficient documentation

## 2020-09-16 DIAGNOSIS — G4733 Obstructive sleep apnea (adult) (pediatric): Secondary | ICD-10-CM | POA: Diagnosis not present

## 2020-09-16 DIAGNOSIS — Z853 Personal history of malignant neoplasm of breast: Secondary | ICD-10-CM | POA: Insufficient documentation

## 2020-09-16 DIAGNOSIS — M79662 Pain in left lower leg: Secondary | ICD-10-CM | POA: Diagnosis not present

## 2020-09-16 DIAGNOSIS — E785 Hyperlipidemia, unspecified: Secondary | ICD-10-CM | POA: Diagnosis not present

## 2020-09-16 DIAGNOSIS — R1030 Lower abdominal pain, unspecified: Secondary | ICD-10-CM | POA: Diagnosis not present

## 2020-09-16 DIAGNOSIS — M25452 Effusion, left hip: Secondary | ICD-10-CM | POA: Diagnosis not present

## 2020-09-16 DIAGNOSIS — H5703 Miosis: Secondary | ICD-10-CM | POA: Diagnosis not present

## 2020-09-16 DIAGNOSIS — Z794 Long term (current) use of insulin: Secondary | ICD-10-CM | POA: Insufficient documentation

## 2020-09-16 DIAGNOSIS — M79605 Pain in left leg: Secondary | ICD-10-CM

## 2020-09-16 DIAGNOSIS — Z8673 Personal history of transient ischemic attack (TIA), and cerebral infarction without residual deficits: Secondary | ICD-10-CM | POA: Diagnosis not present

## 2020-09-16 DIAGNOSIS — Z90710 Acquired absence of both cervix and uterus: Secondary | ICD-10-CM | POA: Diagnosis not present

## 2020-09-16 DIAGNOSIS — G894 Chronic pain syndrome: Secondary | ICD-10-CM | POA: Diagnosis not present

## 2020-09-16 DIAGNOSIS — Z7984 Long term (current) use of oral hypoglycemic drugs: Secondary | ICD-10-CM | POA: Insufficient documentation

## 2020-09-16 DIAGNOSIS — Z79899 Other long term (current) drug therapy: Secondary | ICD-10-CM | POA: Insufficient documentation

## 2020-09-16 DIAGNOSIS — R509 Fever, unspecified: Secondary | ICD-10-CM | POA: Diagnosis not present

## 2020-09-16 DIAGNOSIS — K760 Fatty (change of) liver, not elsewhere classified: Secondary | ICD-10-CM | POA: Diagnosis not present

## 2020-09-16 DIAGNOSIS — M609 Myositis, unspecified: Secondary | ICD-10-CM | POA: Diagnosis not present

## 2020-09-16 DIAGNOSIS — E039 Hypothyroidism, unspecified: Secondary | ICD-10-CM | POA: Insufficient documentation

## 2020-09-16 DIAGNOSIS — N200 Calculus of kidney: Secondary | ICD-10-CM | POA: Diagnosis not present

## 2020-09-16 DIAGNOSIS — E782 Mixed hyperlipidemia: Secondary | ICD-10-CM | POA: Insufficient documentation

## 2020-09-16 DIAGNOSIS — E1165 Type 2 diabetes mellitus with hyperglycemia: Secondary | ICD-10-CM | POA: Diagnosis not present

## 2020-09-16 DIAGNOSIS — E1142 Type 2 diabetes mellitus with diabetic polyneuropathy: Secondary | ICD-10-CM | POA: Insufficient documentation

## 2020-09-16 DIAGNOSIS — I890 Lymphedema, not elsewhere classified: Secondary | ICD-10-CM | POA: Diagnosis not present

## 2020-09-16 DIAGNOSIS — L03116 Cellulitis of left lower limb: Secondary | ICD-10-CM | POA: Diagnosis not present

## 2020-09-16 DIAGNOSIS — R310 Gross hematuria: Secondary | ICD-10-CM | POA: Diagnosis not present

## 2020-09-16 DIAGNOSIS — B9689 Other specified bacterial agents as the cause of diseases classified elsewhere: Secondary | ICD-10-CM | POA: Insufficient documentation

## 2020-09-16 DIAGNOSIS — L03119 Cellulitis of unspecified part of limb: Secondary | ICD-10-CM | POA: Diagnosis not present

## 2020-09-16 DIAGNOSIS — Z86711 Personal history of pulmonary embolism: Secondary | ICD-10-CM | POA: Diagnosis not present

## 2020-09-16 DIAGNOSIS — M7062 Trochanteric bursitis, left hip: Secondary | ICD-10-CM | POA: Diagnosis not present

## 2020-09-16 DIAGNOSIS — M60052 Infective myositis, left thigh: Secondary | ICD-10-CM | POA: Diagnosis not present

## 2020-09-16 DIAGNOSIS — Z809 Family history of malignant neoplasm, unspecified: Secondary | ICD-10-CM | POA: Diagnosis not present

## 2020-09-16 DIAGNOSIS — Z981 Arthrodesis status: Secondary | ICD-10-CM | POA: Diagnosis not present

## 2020-09-16 DIAGNOSIS — M1612 Unilateral primary osteoarthritis, left hip: Secondary | ICD-10-CM | POA: Diagnosis not present

## 2020-09-16 DIAGNOSIS — Z20822 Contact with and (suspected) exposure to covid-19: Secondary | ICD-10-CM | POA: Diagnosis not present

## 2020-09-16 DIAGNOSIS — F330 Major depressive disorder, recurrent, mild: Secondary | ICD-10-CM | POA: Diagnosis not present

## 2020-09-16 LAB — CBC
HCT: 37.9 % (ref 36.0–46.0)
Hemoglobin: 11.3 g/dL — ABNORMAL LOW (ref 12.0–15.0)
MCH: 23.1 pg — ABNORMAL LOW (ref 26.0–34.0)
MCHC: 29.8 g/dL — ABNORMAL LOW (ref 30.0–36.0)
MCV: 77.3 fL — ABNORMAL LOW (ref 80.0–100.0)
Platelets: 181 10*3/uL (ref 150–400)
RBC: 4.9 MIL/uL (ref 3.87–5.11)
RDW: 17.7 % — ABNORMAL HIGH (ref 11.5–15.5)
WBC: 6.2 10*3/uL (ref 4.0–10.5)
nRBC: 0 % (ref 0.0–0.2)

## 2020-09-16 LAB — URINALYSIS, ROUTINE W REFLEX MICROSCOPIC
Bilirubin Urine: NEGATIVE
Glucose, UA: NEGATIVE mg/dL
Ketones, ur: NEGATIVE mg/dL
Leukocytes,Ua: NEGATIVE
Nitrite: NEGATIVE
Specific Gravity, Urine: 1.04 — ABNORMAL HIGH (ref 1.005–1.030)
pH: 5 (ref 5.0–8.0)

## 2020-09-16 LAB — COMPREHENSIVE METABOLIC PANEL
ALT: 16 U/L (ref 0–44)
AST: 18 U/L (ref 15–41)
Albumin: 3.7 g/dL (ref 3.5–5.0)
Alkaline Phosphatase: 84 U/L (ref 38–126)
Anion gap: 8 (ref 5–15)
BUN: 19 mg/dL (ref 6–20)
CO2: 27 mmol/L (ref 22–32)
Calcium: 9.2 mg/dL (ref 8.9–10.3)
Chloride: 104 mmol/L (ref 98–111)
Creatinine, Ser: 0.91 mg/dL (ref 0.44–1.00)
GFR, Estimated: >60 mL/min (ref >60)
Glucose, Bld: 199 mg/dL — ABNORMAL HIGH (ref 70–99)
Potassium: 4.3 mmol/L (ref 3.5–5.1)
Sodium: 139 mmol/L (ref 135–145)
Total Bilirubin: 0.4 mg/dL (ref 0.3–1.2)
Total Protein: 6.9 g/dL (ref 6.5–8.1)

## 2020-09-16 MED ORDER — METHOCARBAMOL 1000 MG/10ML IJ SOLN
INTRAMUSCULAR | Status: AC
  Filled 2020-09-16: qty 10

## 2020-09-16 MED ORDER — KETOROLAC TROMETHAMINE 60 MG/2ML IM SOLN
60.0000 mg | Freq: Once | INTRAMUSCULAR | Status: AC
  Administered 2020-09-16: 60 mg via INTRAMUSCULAR
  Filled 2020-09-16: qty 2

## 2020-09-16 MED ORDER — DOXYCYCLINE HYCLATE 100 MG PO CAPS: 100.0000 mg | ORAL_CAPSULE | Freq: Two times a day (BID) | ORAL | 0 refills | Status: DC

## 2020-09-16 MED ORDER — IOHEXOL 350 MG/ML SOLN
80.0000 mL | Freq: Once | INTRAVENOUS | Status: AC | PRN
  Administered 2020-09-16: 80 mL via INTRAVENOUS

## 2020-09-16 MED ORDER — METHOCARBAMOL 1000 MG/10ML IJ SOLN
1000.0000 mg | Freq: Once | INTRAVENOUS | Status: AC
  Administered 2020-09-16: 1000 mg via INTRAVENOUS
  Filled 2020-09-16: qty 10

## 2020-09-16 MED ORDER — ACETAMINOPHEN 325 MG PO TABS
650.0000 mg | ORAL_TABLET | Freq: Once | ORAL | Status: AC
  Administered 2020-09-16: 650 mg via ORAL
  Filled 2020-09-16: qty 2

## 2020-09-16 NOTE — ED Notes (Signed)
US done

## 2020-09-16 NOTE — ED Triage Notes (Signed)
Pt arrives POV, in personal wheelchair.  Pt reports left groin pain, starting on Wednesday, 8/24.  Reports pain became severe last night.

## 2020-09-16 NOTE — ED Provider Notes (Signed)
[facility] EMERGENCY DEPT Provider Note   CSN: [id number] Arrival date & time: 09/16/20  2947     History Chief Complaint  Patient presents with   Groin Pain    Cheyenne Guzman is a 55 y.o. female.   Groin Pain Pertinent negatives include no abdominal pain and no shortness of breath.  Patient is a morbidly obese female with a history of DM2, lymphedema, and history of recurrent cellulitis of the lower extremity resulting in sepsis presenting with groin pain. Says that the pain started this Wednesday but got worse overnight. Called EMS but since they were not able to see anything on her skin she decided to wait until morning. The pain was still really bad today so she came into the ED. Says that the pain is present at rest but worse with movement. Describes the pain as similar to her previous presentation of cellulitis which is why is so concerned. She says that it feels like a tearing deep in her leg. Has not noticed any rashes on her leg. No pain in her other leg. Does not    Past Medical History:  Diagnosis Date   Anxiety    per pt. - mild    Arthritis    back & ankles    Asthma    seasonal    Breast cancer ([place])    Cancer ([place])    breast, sees Dr. Jana Hakim , Left - 11/2007   Cellulitis 2020   severe - Left leg    Complication of anesthesia 2013   severe muscle spasms-sore-no doc   Depression    Diabetes mellitus    sees Dr. Chalmers Cater, diagnosed 2003   HA (headache)    Hearing loss    Went to [facility] and Throat,Dr Laing,both ears   Hyperlipidemia    Kidney infection    - hosp. [facility]- septic- 10/2009   Low back pain    Peripheral neuropathy    R sided numbness- face & jaw   Pulmonary embolism ([place]) 2020   Sepsis ([place]) 2020   Sleep apnea 9/15   severe-just started cpap-doing well   TIA (transient ischemic attack)    in 2007,2008, and 2011   Tinnitus of right ear    Urosepsis 08-2009   with Klebsiella    Patient Active Problem List    Diagnosis Date Noted   Restless legs syndrome 04/14/2019   Dyslipidemia 11/07/2018   Type 2 diabetes mellitus with hyperglycemia, with long-term current use of insulin ([place]) 09/18/2018   Type 2 diabetes mellitus with diabetic polyneuropathy, with long-term current use of insulin ([place]) 09/18/2018   Recurrent cellulitis of lower extremity 07/31/2018   Serratia infection 07/31/2018   Morbid obesity with body mass index of 60.0-69.9 in adult [facility])    Sepsis ([place]) 06/19/2018   Chronic pain 06/19/2018   Pulmonary embolism ([place]) 03/12/2018   Diabetes mellitus type 2 in obese ([place]) 03/12/2018   Hypothyroidism 08/14/2017   Lymphedema of both lower extremities 10/18/2015   Diabetic neuropathy ([place]) 01/07/2014   Congenital spondylolysis of lumbosacral region 11/02/2013   Chronic LBP 10/08/2013   Obstructive sleep apnea 07/31/2013   Hearing loss 06/23/2013   Shortness of breath 11/27/2012   Cellulitis of left lower extremity 07/03/2012   DCIS (ductal carcinoma in situ) of breast, left, treated with [facility] 11/2007 02/19/2011   Asthma 02/20/2008   Hyperlipidemia associated with type 2 diabetes mellitus ([place]) 11/26/2006   Anxiety and depression 11/26/2006    Past Surgical History:  Procedure Laterality Date   ABDOMINAL HYSTERECTOMY  08/2004   BREAST BIOPSY  06/22/2011   Procedure: BREAST BIOPSY;  Surgeon: Stark Klein, MD;  Location: [facility];  Service: General;  Laterality: Right;   BREAST LUMPECTOMY     BREAST SURGERY  11/2007   left ductal carcinoma in situ   CARPAL TUNNEL RELEASE Right 10/13/2013   Procedure: RIGHT CARPAL TUNNEL RELEASE;  Surgeon: Hessie Dibble, MD;  Location: [facility];  Service: Orthopedics;  Laterality: Right;   CARPAL TUNNEL RELEASE Left 11/24/2013   Procedure: LEFT CARPAL TUNNEL RELEASE WITH CYST EXCISION ;  Surgeon: Hessie Dibble, MD;  Location: [facility];  Service: Orthopedics;  Laterality: Left;   EAR CYST EXCISION Left 11/24/2013    Procedure: CYST REMOVAL;  Surgeon: Hessie Dibble, MD;  Location: [facility];  Service: Orthopedics;  Laterality: Left;   FOOT SURGERY  2000   plantar fasciitis-left   LUMBAR DISC SURGERY  03-29-14   Fusion, revision 04-27-14 L4-5 Cauda Equina with graft   NERVE GRAFT  06-01-08   cadaver graft to right inferior alveolar nerve  in [place]     OB History   No obstetric history on file.     Family History  Problem Relation Age of Onset   Cancer Mother        breast   Cancer Father        lung   Diabetes Father    Cancer Sister 70       colon   Cancer Maternal Aunt        breast - both   Diabetes Sister    Anesthesia problems Neg Hx     Social History   Tobacco Use   Smoking status: Former    Types: Cigarettes    Quit date: 02/19/1992    Years since quitting: 28.5   Smokeless tobacco: Never  Vaping Use   Vaping Use: Never used  Substance Use Topics   Alcohol use: Not Currently    Comment: rare   Drug use: No    Home Medications Prior to Admission medications   Medication Sig Start Date End Date Taking? Authorizing Provider  albuterol (VENTOLIN HFA) 108 (90 Base) MCG/ACT inhaler Inhale 2 puffs into the lungs every 4 (four) hours as needed for wheezing or shortness of breath. 12/05/18  Yes Laurey Morale, MD  atorvastatin (LIPITOR) 10 MG tablet Take 1 tablet (10 mg total) by mouth daily. 04/27/20  Yes Shamleffer, Melanie Crazier, MD  Buprenorphine 15 MCG/HR PTWK Place 1 patch onto the skin every Saturday.  07/23/17  Yes [provider]  furosemide (LASIX) 40 MG tablet TAKE 1 TABLET BY MOUTH TWICE A DAY 05/17/20  Yes Laurey Morale, MD  gabapentin (NEURONTIN) 300 MG capsule Take 300 mg by mouth 2 (two) times daily.    Yes [provider]  levothyroxine (SYNTHROID) 75 MCG tablet TAKE 1 TABLET BY MOUTH EVERY DAY 08/08/20  Yes Laurey Morale, MD  methocarbamol (ROBAXIN) 750 MG tablet Take 1 tablet (750 mg total) by mouth 2 (two) times daily.  02/03/15  Yes Laurey Morale, MD  Multiple Vitamin (MULTIVITAMIN WITH MINERALS) TABS tablet Take 1 tablet by mouth daily.   Yes [provider]  sertraline (ZOLOFT) 100 MG tablet TAKE 2 TABLETS (200 MG TOTAL) BY MOUTH AT BEDTIME. 08/08/20  Yes Laurey Morale, MD  XARELTO 20 MG TABS tablet TAKE 1 TABLET BY MOUTH EVERY DAY 10/26/19  Yes Laurey Morale, MD  albuterol (PROVENTIL) (2.5 MG/3ML) 0.083% nebulizer solution 1 VIAL IN NEBULIZER EVERY 4 HOURS AS NEEDED FOR WHEEZING Patient taking differently: Take 2.5 mg by nebulization every 4 (four) hours as needed for wheezing. 10/13/13   Laurey Morale, MD  ALPRAZolam Duanne Moron) 1 MG tablet TAKE 1 TABLET BY MOUTH THREE TIMES A DAY Patient taking differently: Patient takes as needed 03/06/18   Laurey Morale, MD  Blood Glucose Monitoring Suppl (CONTOUR NEXT MONITOR) w/Device KIT 1 each by Does not apply route 2 (two) times daily. To check blood sugars twice a day. 08/27/16   Renato Shin, MD  cephALEXin [facility]) 500 MG capsule Take 1 capsule three times daily for 10 days Patient not taking: Reported on 08/09/2020 07/26/20   Laurey Morale, MD  glucose blood (CONTOUR NEXT TEST) test strip Use to test blood sugar 2 times daily 12/18/16   Renato Shin, MD  glucose blood test strip Use as instructed 08/24/16   Renato Shin, MD  HUMALOG MIX 75/25 (75-25) 100 UNIT/ML SUSP injection INJECT 110 UNITS INTO THE SKIN 2 TIMES DAILY WITH A MEAL. Patient taking differently: INJECT 120 UNITS INTO THE SKIN 2 TIMES DAILY WITH A MEAL. 06/16/20   Shamleffer, Melanie Crazier, MD  Insulin Syringe-Needle U-100 (INSULIN SYRINGE 1CC/31GX5/16") 31G X 5/16" 1 ML MISC 2x daily 07/20/19   Shamleffer, Melanie Crazier, MD  lidocaine (XYLOCAINE) 5 % ointment Apply to areas of pain up to bid. 12/23/19   [provider]  metFORMIN (GLUCOPHAGE) 1000 MG tablet Take 1 tablet (1,000 mg total) by mouth daily with breakfast. 07/20/19   Shamleffer, Melanie Crazier, MD  pramipexole (MIRAPEX) 0.5  MG tablet TAKE 1 TABLET BY MOUTH EVERYDAY AT BEDTIME 09/16/20   Laurey Morale, MD  Semaglutide,0.25 or 0.5MG/DOS, (OZEMPIC, 0.25 OR 0.5 MG/DOSE,) 2 MG/1.5ML SOPN Inject 0.375 mLs (0.5 mg total) into the skin once a week. Patient not taking: No sig reported 07/20/19   Shamleffer, Melanie Crazier, MD    Allergies    Vicodin [hydrocodone-acetaminophen]  Review of Systems   Review of Systems  Constitutional:  Negative for fever.  Respiratory:  Negative for shortness of breath.   Gastrointestinal:  Negative for abdominal pain.  Genitourinary:  Negative for dyspareunia.  Musculoskeletal:  Positive for myalgias.  Skin:  Negative for color change, rash and wound.   Physical Exam Updated Vital Signs BP (!) 117/101   Pulse 78   Temp 98.6 F (37 C) (Oral)   Resp 20   Ht _0  (1.702 m)   Wt (!) 212.1 kg   SpO2 96%   BMI 73.25 kg/m   Physical Exam Constitutional:      Appearance: She is obese.  HENT:     Head: Normocephalic and atraumatic.  Eyes:     Extraocular Movements: Extraocular movements intact.  Cardiovascular:     Rate and Rhythm: Normal rate and regular rhythm.  Pulmonary:     Effort: Pulmonary effort is normal.  Abdominal:     Tenderness: There is no abdominal tenderness.  Musculoskeletal:     Cervical back: Normal range of motion.  Skin:    General: Skin is warm and dry.     Findings: No erythema or rash.  Neurological:     Mental Status: She is alert.    ED Results / Procedures / Treatments   Labs (all labs ordered are listed, but only abnormal results are displayed) Labs Reviewed  CBC - Abnormal; Notable for the following  components:      Result Value   Hemoglobin 11.3 (*)    MCV 77.3 (*)    MCH 23.1 (*)    MCHC 29.8 (*)    RDW 17.7 (*)    All other components within normal limits  COMPREHENSIVE METABOLIC PANEL - Abnormal; Notable for the following components:   Glucose, Bld 199 (*)    All other components within normal limits  URINALYSIS, ROUTINE W  REFLEX MICROSCOPIC - Abnormal; Notable for the following components:   Specific Gravity, Urine 1.040 (*)    Hgb urine dipstick LARGE (*)    Protein, ur TRACE (*)    Bacteria, UA RARE (*)    All other components within normal limits    EKG None  Radiology CT ABDOMEN PELVIS W CONTRAST  Result Date: 09/16/2020 CLINICAL DATA:  Groin pain, concern for necrotizing fasciitis EXAM: CT ABDOMEN AND PELVIS WITH CONTRAST TECHNIQUE: Multidetector CT imaging of the abdomen and pelvis was performed using the standard protocol following bolus administration of intravenous contrast. CONTRAST:  60m OMNIPAQUE IOHEXOL 350 MG/ML SOLN COMPARISON:  CT abdomen/pelvis 11/12/2018 FINDINGS: Lower chest: The lung bases are clear. The imaged heart is unremarkable. Hepatobiliary: The liver is mildly enlarged, measuring proximally 18.5 cm at the mid clavicular line, not significantly changed. There are no focal lesions. The gallbladder is unremarkable. There is no biliary ductal dilatation. Pancreas: Unremarkable. Spleen: The spleen is enlarged measuring up to 17.0 cm in maximum dimension, not significantly changed. There are no focal lesions. Adrenals/Urinary Tract: The adrenals are unremarkable. There is a 9 mm left lower pole renal stone, increased in size since the prior study. There is mild cortical scarring in the right mid kidney, unchanged. There are no solid lesions. There is no hydronephrosis or hydroureter. The bladder is unremarkable. Stomach/Bowel: The stomach is unremarkable. There is no evidence of bowel obstruction. There is no abnormal bowel wall thickening or inflammatory change. The appendix is normal. Vascular/Lymphatic: There is mild calcified atherosclerotic plaque in the nonaneurysmal abdominal aorta. The main portal and splenic veins appear patent. There is a 1.6 cm portacaval lymph node, unchanged since 2020, nonspecific. There is no new or progressive lymphadenopathy in the abdomen or pelvis. Mild haziness  in the mesenteric root is nonspecific, not significantly changed since 2020. Reproductive: Status post hysterectomy. No adnexal masses. Other: There is fat stranding throughout the lower abdominal wall with overlying skin thickening. There is no soft tissue gas. There is no organized or drainable collection. There is no ascites or free air. There is diffuse body wall edema. Musculoskeletal: The patient is status post L4-L5LIF. There is no acute osseous abnormality. IMPRESSION: 1. Stranding in the subcutaneous fat of the lower anterior abdominal wall suggestive of cellulitis; correlate with physical exam. There is no organized fluid collection, and no soft tissue gas to suggest necrotizing fasciitis. 2. Diffuse edema throughout the remainder of the body wall. 3. 9 mm nonobstructing left lower pole renal stone. 4. Hepatosplenomegaly, unchanged. Electronically Signed   By: PValetta MoleM.D.   On: 09/16/2020 13:13    Procedures Procedures   Medications Ordered in ED Medications  methocarbamol (ROBAXIN) 1000 MG/10ML injection (1,000 mg  Not Given 09/16/20 1135)  methocarbamol (ROBAXIN) 1,000 mg in dextrose 5 % 100 mL IVPB (1,000 mg Intravenous New Bag/Given 09/16/20 1201)  acetaminophen (TYLENOL) tablet 650 mg (650 mg Oral Given 09/16/20 1142)  iohexol (OMNIPAQUE) 350 MG/ML injection 80 mL (80 mLs Intravenous Contrast Given 09/16/20 1223)  ketorolac (TORADOL) injection 60 mg (  60 mg Intramuscular Given 09/16/20 1420)    ED Course  I have reviewed the triage vital signs and the nursing notes.  Pertinent labs & imaging results that were available during my care of the patient were reviewed by me and considered in my medical decision making (see chart for details).    MDM Rules/Calculators/A&P                           Patient non-toxic appearing on exam, no erythema, rash, induration, swelling, or skin changes noted. Thigh is tender to deep palpation. No skin changes evident in the perineum area. Due to  prior history of cellulitis CTAP ordered which showed concern for abdominal wall cellulitis, but said correlate clinically, and patient does not have abdominal tenderness. Out of an abundance of caution will prescribe patient a course of antibiotics and order a DVT study. Patient prescribed buprenorphine and takes xarelto, limited on available pain medications. Patient given a dose of 1042m roxabin and a 60 mg toradol injection. BMP and CBC reassuring. Spoke with patient that as long as no DVT found she will be stable to go home and follow up with her PCP. Sign out given, dispo per Dr. TSherry Ruffing  Final Clinical Impression(s) / ED Diagnoses Final diagnoses:  None    Rx / DC Orders ED Discharge Orders     None        DScarlett Presto MD 09/16/20 1520    DLucrezia Starch MD 09/17/20 1017

## 2020-09-16 NOTE — Discharge Instructions (Addendum)
You were seen at [facility] Emergency Department for leg pain. We checked your blood work which was reassuring that you did not have sepsis. Your white blood cell count was normal which was reassuring. You were also given a CT scan which showed possible abdominal wall cellulitis. However, given this does not match up with the location of your pain we do not think that this is the cause your groin pain. Out of an abundance of caution we will give you antibiotics to treat a possible infection and you should follow up with your primary care doctor. We also gave you an injection of toradol for your pain as well as a dose of IV roxabin. We also did an ultrasound of your leg which did not show a blood clot. If you start to experience increasing pain, weakness, inability to move your leg, fever, or increased redness of your skin we recommend that you come back to the emergency room.

## 2020-09-16 NOTE — ED Provider Notes (Signed)
3:07 PM Care assumed from Dr. Roslynn Amble. Awaiting [place] to rule out DVT. Plan to DC with abx if [place] is reassuring.   7:00 PM Ultrasound returned showing no evidence of DVT.  Had a long conversation with patient and family and we agreed to let her be discharged home with doxycycline for antibiotics and to follow-up with her primary doctor in several days.  If any symptoms were to change or worsen including worsening signs of infection, patient agrees with return for reassessment.  Patient will also call her pain team and PCP to discuss escalating her pain regimen at home to help with discomfort.   Clinical Impression: 1. Cellulitis of abdominal wall   2. Pain of left lower extremity   3. Left inguinal pain   4. Myalgia     Disposition: Discharge  Condition: Good  I have discussed the results, Dx and Tx plan with the pt(& family if present). He/she/they expressed understanding and agree(s) with the plan. Discharge instructions discussed at great length. Strict return precautions discussed and pt &/or family have verbalized understanding of the instructions. No further questions at time of discharge.    New Prescriptions   DOXYCYCLINE (VIBRAMYCIN) 100 MG CAPSULE    Take 1 capsule (100 mg total) by mouth 2 (two) times daily.    Follow Up: Laurey Morale, MD [place] [address] [phone]     [facility] [place] [phone]       Zeferino Mounts, Gwenyth Allegra, MD 09/16/20 1901

## 2020-09-16 NOTE — ED Notes (Signed)
Patient is resting comfortably. 

## 2020-09-19 ENCOUNTER — Emergency Department (HOSPITAL_BASED_OUTPATIENT_CLINIC_OR_DEPARTMENT_OTHER): Payer: Medicare Other

## 2020-09-19 ENCOUNTER — Encounter (HOSPITAL_BASED_OUTPATIENT_CLINIC_OR_DEPARTMENT_OTHER): Payer: Self-pay

## 2020-09-19 ENCOUNTER — Inpatient Hospital Stay (HOSPITAL_BASED_OUTPATIENT_CLINIC_OR_DEPARTMENT_OTHER)
Admission: EM | Admit: 2020-09-19 | Discharge: 2020-09-23 | DRG: 556 | Disposition: A | Discharge status: Home / Self Care | Payer: Medicare Other | Attending: Internal Medicine | Admitting: Internal Medicine

## 2020-09-19 DIAGNOSIS — L03116 Cellulitis of left lower limb: Secondary | ICD-10-CM | POA: Diagnosis not present

## 2020-09-19 DIAGNOSIS — G4733 Obstructive sleep apnea (adult) (pediatric): Secondary | ICD-10-CM | POA: Diagnosis present

## 2020-09-19 DIAGNOSIS — K760 Fatty (change of) liver, not elsewhere classified: Secondary | ICD-10-CM | POA: Diagnosis present

## 2020-09-19 DIAGNOSIS — M60052 Infective myositis, left thigh: Secondary | ICD-10-CM | POA: Diagnosis not present

## 2020-09-19 DIAGNOSIS — L03119 Cellulitis of unspecified part of limb: Secondary | ICD-10-CM | POA: Diagnosis present

## 2020-09-19 DIAGNOSIS — R1032 Left lower quadrant pain: Secondary | ICD-10-CM | POA: Diagnosis not present

## 2020-09-19 DIAGNOSIS — A419 Sepsis, unspecified organism: Secondary | ICD-10-CM

## 2020-09-19 DIAGNOSIS — Z90710 Acquired absence of both cervix and uterus: Secondary | ICD-10-CM | POA: Diagnosis not present

## 2020-09-19 DIAGNOSIS — I890 Lymphedema, not elsewhere classified: Secondary | ICD-10-CM | POA: Diagnosis not present

## 2020-09-19 DIAGNOSIS — Z79899 Other long term (current) drug therapy: Secondary | ICD-10-CM

## 2020-09-19 DIAGNOSIS — H9190 Unspecified hearing loss, unspecified ear: Secondary | ICD-10-CM | POA: Diagnosis present

## 2020-09-19 DIAGNOSIS — E1169 Type 2 diabetes mellitus with other specified complication: Secondary | ICD-10-CM

## 2020-09-19 DIAGNOSIS — L03311 Cellulitis of abdominal wall: Secondary | ICD-10-CM | POA: Diagnosis not present

## 2020-09-19 DIAGNOSIS — Z809 Family history of malignant neoplasm, unspecified: Secondary | ICD-10-CM | POA: Diagnosis not present

## 2020-09-19 DIAGNOSIS — F330 Major depressive disorder, recurrent, mild: Secondary | ICD-10-CM | POA: Diagnosis not present

## 2020-09-19 DIAGNOSIS — Z981 Arthrodesis status: Secondary | ICD-10-CM | POA: Diagnosis not present

## 2020-09-19 DIAGNOSIS — Z20822 Contact with and (suspected) exposure to covid-19: Secondary | ICD-10-CM | POA: Diagnosis present

## 2020-09-19 DIAGNOSIS — Z8673 Personal history of transient ischemic attack (TIA), and cerebral infarction without residual deficits: Secondary | ICD-10-CM

## 2020-09-19 DIAGNOSIS — M25552 Pain in left hip: Secondary | ICD-10-CM | POA: Diagnosis present

## 2020-09-19 DIAGNOSIS — M7918 Myalgia, other site: Secondary | ICD-10-CM | POA: Insufficient documentation

## 2020-09-19 DIAGNOSIS — J45909 Unspecified asthma, uncomplicated: Secondary | ICD-10-CM | POA: Diagnosis present

## 2020-09-19 DIAGNOSIS — F419 Anxiety disorder, unspecified: Secondary | ICD-10-CM | POA: Diagnosis present

## 2020-09-19 DIAGNOSIS — R112 Nausea with vomiting, unspecified: Secondary | ICD-10-CM | POA: Diagnosis not present

## 2020-09-19 DIAGNOSIS — R310 Gross hematuria: Secondary | ICD-10-CM | POA: Diagnosis not present

## 2020-09-19 DIAGNOSIS — M609 Myositis, unspecified: Secondary | ICD-10-CM | POA: Diagnosis present

## 2020-09-19 DIAGNOSIS — Z7984 Long term (current) use of oral hypoglycemic drugs: Secondary | ICD-10-CM

## 2020-09-19 DIAGNOSIS — F32A Depression, unspecified: Secondary | ICD-10-CM | POA: Diagnosis present

## 2020-09-19 DIAGNOSIS — M009 Pyogenic arthritis, unspecified: Secondary | ICD-10-CM | POA: Diagnosis not present

## 2020-09-19 DIAGNOSIS — Z853 Personal history of malignant neoplasm of breast: Secondary | ICD-10-CM | POA: Diagnosis not present

## 2020-09-19 DIAGNOSIS — R509 Fever, unspecified: Secondary | ICD-10-CM | POA: Diagnosis not present

## 2020-09-19 DIAGNOSIS — Z86711 Personal history of pulmonary embolism: Secondary | ICD-10-CM

## 2020-09-19 DIAGNOSIS — R6510 Systemic inflammatory response syndrome (SIRS) of non-infectious origin without acute organ dysfunction: Secondary | ICD-10-CM | POA: Diagnosis present

## 2020-09-19 DIAGNOSIS — M545 Low back pain, unspecified: Secondary | ICD-10-CM | POA: Diagnosis present

## 2020-09-19 DIAGNOSIS — M1612 Unilateral primary osteoarthritis, left hip: Secondary | ICD-10-CM | POA: Diagnosis not present

## 2020-09-19 DIAGNOSIS — R600 Localized edema: Secondary | ICD-10-CM | POA: Diagnosis not present

## 2020-09-19 DIAGNOSIS — G894 Chronic pain syndrome: Secondary | ICD-10-CM | POA: Diagnosis not present

## 2020-09-19 DIAGNOSIS — R000 Tachycardia, unspecified: Secondary | ICD-10-CM | POA: Diagnosis not present

## 2020-09-19 DIAGNOSIS — Z7901 Long term (current) use of anticoagulants: Secondary | ICD-10-CM

## 2020-09-19 DIAGNOSIS — R109 Unspecified abdominal pain: Secondary | ICD-10-CM | POA: Diagnosis not present

## 2020-09-19 DIAGNOSIS — N200 Calculus of kidney: Secondary | ICD-10-CM | POA: Diagnosis present

## 2020-09-19 DIAGNOSIS — M7062 Trochanteric bursitis, left hip: Secondary | ICD-10-CM | POA: Diagnosis not present

## 2020-09-19 DIAGNOSIS — Z833 Family history of diabetes mellitus: Secondary | ICD-10-CM

## 2020-09-19 DIAGNOSIS — E785 Hyperlipidemia, unspecified: Secondary | ICD-10-CM | POA: Diagnosis present

## 2020-09-19 DIAGNOSIS — Z794 Long term (current) use of insulin: Secondary | ICD-10-CM | POA: Diagnosis not present

## 2020-09-19 DIAGNOSIS — H5703 Miosis: Secondary | ICD-10-CM | POA: Diagnosis not present

## 2020-09-19 DIAGNOSIS — E1165 Type 2 diabetes mellitus with hyperglycemia: Secondary | ICD-10-CM | POA: Diagnosis present

## 2020-09-19 DIAGNOSIS — I878 Other specified disorders of veins: Secondary | ICD-10-CM | POA: Diagnosis present

## 2020-09-19 DIAGNOSIS — G8929 Other chronic pain: Secondary | ICD-10-CM | POA: Diagnosis present

## 2020-09-19 DIAGNOSIS — Z87891 Personal history of nicotine dependence: Secondary | ICD-10-CM | POA: Diagnosis not present

## 2020-09-19 DIAGNOSIS — Z79890 Hormone replacement therapy (postmenopausal): Secondary | ICD-10-CM

## 2020-09-19 DIAGNOSIS — E1142 Type 2 diabetes mellitus with diabetic polyneuropathy: Secondary | ICD-10-CM | POA: Diagnosis present

## 2020-09-19 DIAGNOSIS — Z6841 Body Mass Index (BMI) 40.0 and over, adult: Secondary | ICD-10-CM

## 2020-09-19 DIAGNOSIS — R161 Splenomegaly, not elsewhere classified: Secondary | ICD-10-CM | POA: Diagnosis present

## 2020-09-19 DIAGNOSIS — E039 Hypothyroidism, unspecified: Secondary | ICD-10-CM | POA: Diagnosis present

## 2020-09-19 DIAGNOSIS — M25452 Effusion, left hip: Secondary | ICD-10-CM | POA: Diagnosis not present

## 2020-09-19 DIAGNOSIS — E669 Obesity, unspecified: Secondary | ICD-10-CM

## 2020-09-19 LAB — URINALYSIS, ROUTINE W REFLEX MICROSCOPIC
Bilirubin Urine: NEGATIVE
Glucose, UA: NEGATIVE mg/dL
Ketones, ur: NEGATIVE mg/dL
Leukocytes,Ua: NEGATIVE
Nitrite: NEGATIVE
Protein, ur: NEGATIVE mg/dL
Specific Gravity, Urine: 1.021 (ref 1.005–1.030)
pH: 6 (ref 5.0–8.0)

## 2020-09-19 LAB — COMPREHENSIVE METABOLIC PANEL
ALT: 15 U/L (ref 0–44)
AST: 22 U/L (ref 15–41)
Albumin: 4.1 g/dL (ref 3.5–5.0)
Alkaline Phosphatase: 80 U/L (ref 38–126)
Anion gap: 9 (ref 5–15)
BUN: 19 mg/dL (ref 6–20)
CO2: 28 mmol/L (ref 22–32)
Calcium: 9.3 mg/dL (ref 8.9–10.3)
Chloride: 102 mmol/L (ref 98–111)
Creatinine, Ser: 1.04 mg/dL — ABNORMAL HIGH (ref 0.44–1.00)
GFR, Estimated: >60 mL/min (ref >60)
Glucose, Bld: 164 mg/dL — ABNORMAL HIGH (ref 70–99)
Potassium: 4.4 mmol/L (ref 3.5–5.1)
Sodium: 139 mmol/L (ref 135–145)
Total Bilirubin: 0.7 mg/dL (ref 0.3–1.2)
Total Protein: 7.3 g/dL (ref 6.5–8.1)

## 2020-09-19 LAB — RESP PANEL BY RT-PCR (FLU A&B, COVID) ARPGX2
Influenza A by PCR: NEGATIVE
Influenza B by PCR: NEGATIVE
SARS Coronavirus 2 by RT PCR: NEGATIVE

## 2020-09-19 LAB — CBC WITH DIFFERENTIAL/PLATELET
Abs Immature Granulocytes: 0.04 10*3/uL (ref 0.00–0.07)
Basophils Absolute: 0 10*3/uL (ref 0.0–0.1)
Basophils Relative: 0 %
Eosinophils Absolute: 0.1 10*3/uL (ref 0.0–0.5)
Eosinophils Relative: 1 %
HCT: 40.1 % (ref 36.0–46.0)
Hemoglobin: 11.8 g/dL — ABNORMAL LOW (ref 12.0–15.0)
Immature Granulocytes: 0 %
Lymphocytes Relative: 6 %
Lymphs Abs: 0.6 10*3/uL — ABNORMAL LOW (ref 0.7–4.0)
MCH: 23 pg — ABNORMAL LOW (ref 26.0–34.0)
MCHC: 29.4 g/dL — ABNORMAL LOW (ref 30.0–36.0)
MCV: 78.3 fL — ABNORMAL LOW (ref 80.0–100.0)
Monocytes Absolute: 0.5 10*3/uL (ref 0.1–1.0)
Monocytes Relative: 5 %
Neutro Abs: 9 10*3/uL — ABNORMAL HIGH (ref 1.7–7.7)
Neutrophils Relative %: 88 %
Platelets: 204 10*3/uL (ref 150–400)
RBC: 5.12 MIL/uL — ABNORMAL HIGH (ref 3.87–5.11)
RDW: 18.5 % — ABNORMAL HIGH (ref 11.5–15.5)
WBC: 10.3 10*3/uL (ref 4.0–10.5)
nRBC: 0 % (ref 0.0–0.2)

## 2020-09-19 LAB — LACTIC ACID, PLASMA: Lactic Acid, Venous: 1.7 mmol/L (ref 0.5–1.9)

## 2020-09-19 LAB — GLUCOSE, CAPILLARY: Glucose-Capillary: 204 mg/dL — ABNORMAL HIGH (ref 70–99)

## 2020-09-19 MED ORDER — IOHEXOL 350 MG/ML SOLN
100.0000 mL | Freq: Once | INTRAVENOUS | Status: AC | PRN
  Administered 2020-09-19: 100 mL via INTRAVENOUS

## 2020-09-19 MED ORDER — ACETAMINOPHEN 325 MG PO TABS
650.0000 mg | ORAL_TABLET | Freq: Once | ORAL | Status: AC
  Administered 2020-09-19: 650 mg via ORAL
  Filled 2020-09-19: qty 2

## 2020-09-19 MED ORDER — IBUPROFEN 400 MG PO TABS
600.0000 mg | ORAL_TABLET | Freq: Once | ORAL | Status: AC
  Administered 2020-09-19: 600 mg via ORAL
  Filled 2020-09-19: qty 1

## 2020-09-19 MED ORDER — RIVAROXABAN 20 MG PO TABS
20.0000 mg | ORAL_TABLET | Freq: Every day | ORAL | Status: DC
  Administered 2020-09-19 – 2020-09-22 (×4): 20 mg via ORAL
  Filled 2020-09-19 (×4): qty 1

## 2020-09-19 MED ORDER — ADULT MULTIVITAMIN W/MINERALS CH
1.0000 | ORAL_TABLET | Freq: Every day | ORAL | Status: DC
  Administered 2020-09-20 – 2020-09-23 (×4): 1 via ORAL
  Filled 2020-09-19 (×5): qty 1

## 2020-09-19 MED ORDER — OXYCODONE HCL 5 MG PO TABS
5.0000 mg | ORAL_TABLET | ORAL | Status: DC | PRN
Start: 2020-09-19 — End: 2020-09-23
  Administered 2020-09-19 – 2020-09-22 (×6): 5 mg via ORAL
  Filled 2020-09-19 (×6): qty 1

## 2020-09-19 MED ORDER — ONDANSETRON HCL 4 MG PO TABS: 4.0000 mg | ORAL_TABLET | Freq: Four times a day (QID) | ORAL | Status: DC | PRN

## 2020-09-19 MED ORDER — GABAPENTIN 400 MG PO CAPS
400.0000 mg | ORAL_CAPSULE | Freq: Two times a day (BID) | ORAL | Status: DC
  Administered 2020-09-19 – 2020-09-23 (×8): 400 mg via ORAL
  Filled 2020-09-19 (×8): qty 1

## 2020-09-19 MED ORDER — LEVOTHYROXINE SODIUM 75 MCG PO TABS
75.0000 ug | ORAL_TABLET | Freq: Every day | ORAL | Status: DC
  Administered 2020-09-20 – 2020-09-23 (×4): 75 ug via ORAL
  Filled 2020-09-19 (×4): qty 1

## 2020-09-19 MED ORDER — INSULIN ASPART 100 UNIT/ML IJ SOLN
0.0000 [IU] | Freq: Three times a day (TID) | INTRAMUSCULAR | Status: DC
  Administered 2020-09-20: 5 [IU] via SUBCUTANEOUS
  Administered 2020-09-20 – 2020-09-21 (×3): 8 [IU] via SUBCUTANEOUS
  Administered 2020-09-21: 5 [IU] via SUBCUTANEOUS

## 2020-09-19 MED ORDER — SENNOSIDES-DOCUSATE SODIUM 8.6-50 MG PO TABS: 1.0000 | ORAL_TABLET | Freq: Every evening | ORAL | Status: DC | PRN

## 2020-09-19 MED ORDER — MORPHINE SULFATE (PF) 2 MG/ML IV SOLN
2.0000 mg | INTRAVENOUS | Status: DC | PRN
  Administered 2020-09-20: 2 mg via INTRAVENOUS
  Filled 2020-09-19: qty 1

## 2020-09-19 MED ORDER — METOPROLOL TARTRATE 5 MG/5ML IV SOLN: 5.0000 mg | Freq: Four times a day (QID) | INTRAVENOUS | Status: DC | PRN

## 2020-09-19 MED ORDER — METHOCARBAMOL 1000 MG/10ML IJ SOLN
1000.0000 mg | Freq: Once | INTRAMUSCULAR | Status: AC
  Administered 2020-09-19: 1000 mg via INTRAVENOUS
  Filled 2020-09-19: qty 10

## 2020-09-19 MED ORDER — VANCOMYCIN HCL IN DEXTROSE 1-5 GM/200ML-% IV SOLN
1000.0000 mg | Freq: Three times a day (TID) | INTRAVENOUS | Status: DC
  Administered 2020-09-19 – 2020-09-20 (×4): 1000 mg via INTRAVENOUS
  Filled 2020-09-19 (×6): qty 200

## 2020-09-19 MED ORDER — VITAMIN D 25 MCG (1000 UNIT) PO TABS
2000.0000 [IU] | ORAL_TABLET | Freq: Every morning | ORAL | Status: DC
  Administered 2020-09-20 – 2020-09-23 (×4): 2000 [IU] via ORAL
  Filled 2020-09-19 (×4): qty 2

## 2020-09-19 MED ORDER — PRAMIPEXOLE DIHYDROCHLORIDE 0.25 MG PO TABS
0.5000 mg | ORAL_TABLET | Freq: Every day | ORAL | Status: DC
  Administered 2020-09-19 – 2020-09-22 (×4): 0.5 mg via ORAL
  Filled 2020-09-19 (×4): qty 2

## 2020-09-19 MED ORDER — ACETAMINOPHEN 325 MG PO TABS
650.0000 mg | ORAL_TABLET | Freq: Four times a day (QID) | ORAL | Status: DC | PRN
  Administered 2020-09-20 – 2020-09-21 (×4): 650 mg via ORAL
  Filled 2020-09-19 (×4): qty 2

## 2020-09-19 MED ORDER — PIPERACILLIN-TAZOBACTAM 3.375 G IVPB
3.3750 g | Freq: Once | INTRAVENOUS | Status: AC
  Administered 2020-09-19: 3.375 g via INTRAVENOUS
  Filled 2020-09-19: qty 50

## 2020-09-19 MED ORDER — ATORVASTATIN CALCIUM 10 MG PO TABS
10.0000 mg | ORAL_TABLET | Freq: Every day | ORAL | Status: DC
  Administered 2020-09-19 – 2020-09-23 (×5): 10 mg via ORAL
  Filled 2020-09-19 (×5): qty 1

## 2020-09-19 MED ORDER — SODIUM CHLORIDE 0.9 % IV BOLUS
1000.0000 mL | Freq: Once | INTRAVENOUS | Status: AC
  Administered 2020-09-19: 1000 mL via INTRAVENOUS

## 2020-09-19 MED ORDER — VANCOMYCIN HCL IN DEXTROSE 1-5 GM/200ML-% IV SOLN: 1000.0000 mg | Freq: Once | INTRAVENOUS | Status: DC

## 2020-09-19 MED ORDER — INSULIN DETEMIR 100 UNIT/ML ~~LOC~~ SOLN
50.0000 [IU] | Freq: Two times a day (BID) | SUBCUTANEOUS | Status: DC
  Administered 2020-09-19 – 2020-09-21 (×4): 50 [IU] via SUBCUTANEOUS
  Filled 2020-09-19 (×5): qty 0.5

## 2020-09-19 MED ORDER — MORPHINE SULFATE (PF) 4 MG/ML IV SOLN
4.0000 mg | Freq: Once | INTRAVENOUS | Status: AC
Start: 2020-09-19 — End: 2020-09-19
  Administered 2020-09-19: 4 mg via INTRAVENOUS
  Filled 2020-09-19: qty 1

## 2020-09-19 MED ORDER — ACETAMINOPHEN 650 MG RE SUPP: 650.0000 mg | Freq: Four times a day (QID) | RECTAL | Status: DC | PRN

## 2020-09-19 MED ORDER — HYDROMORPHONE HCL 1 MG/ML IJ SOLN
1.0000 mg | Freq: Once | INTRAMUSCULAR | Status: AC
  Administered 2020-09-19: 1 mg via INTRAVENOUS
  Filled 2020-09-19: qty 1

## 2020-09-19 MED ORDER — ONDANSETRON HCL 4 MG/2ML IJ SOLN: 4.0000 mg | Freq: Four times a day (QID) | INTRAMUSCULAR | Status: DC | PRN

## 2020-09-19 MED ORDER — METHOCARBAMOL 500 MG PO TABS
750.0000 mg | ORAL_TABLET | Freq: Every morning | ORAL | Status: DC
  Administered 2020-09-20 – 2020-09-23 (×4): 750 mg via ORAL
  Filled 2020-09-19 (×5): qty 2

## 2020-09-19 MED ORDER — ALBUTEROL SULFATE (2.5 MG/3ML) 0.083% IN NEBU: 2.5000 mg | INHALATION_SOLUTION | RESPIRATORY_TRACT | Status: DC | PRN

## 2020-09-19 MED ORDER — VANCOMYCIN HCL IN DEXTROSE 1-5 GM/200ML-% IV SOLN
1000.0000 mg | Freq: Once | INTRAVENOUS | Status: AC
  Administered 2020-09-19: 1000 mg via INTRAVENOUS
  Filled 2020-09-19: qty 200

## 2020-09-19 MED ORDER — BISACODYL 5 MG PO TBEC
5.0000 mg | DELAYED_RELEASE_TABLET | Freq: Every day | ORAL | Status: DC | PRN
  Administered 2020-09-23: 5 mg via ORAL
  Filled 2020-09-19: qty 1

## 2020-09-19 MED ORDER — KETOROLAC TROMETHAMINE 15 MG/ML IJ SOLN
15.0000 mg | Freq: Once | INTRAMUSCULAR | Status: AC
  Administered 2020-09-19: 15 mg via INTRAVENOUS
  Filled 2020-09-19: qty 1

## 2020-09-19 MED ORDER — SERTRALINE HCL 100 MG PO TABS
200.0000 mg | ORAL_TABLET | Freq: Every morning | ORAL | Status: DC
  Administered 2020-09-20 – 2020-09-21 (×2): 200 mg via ORAL
  Filled 2020-09-19 (×2): qty 2

## 2020-09-19 MED ORDER — GABAPENTIN 300 MG PO CAPS: 300.0000 mg | ORAL_CAPSULE | Freq: Two times a day (BID) | ORAL | Status: DC

## 2020-09-19 MED ORDER — ONDANSETRON HCL 4 MG/2ML IJ SOLN
4.0000 mg | Freq: Once | INTRAMUSCULAR | Status: AC
  Administered 2020-09-19: 4 mg via INTRAVENOUS
  Filled 2020-09-19: qty 2

## 2020-09-19 MED ORDER — BUPRENORPHINE 15 MCG/HR TD PTWK
1.0000 | MEDICATED_PATCH | TRANSDERMAL | Status: DC
  Filled 2020-09-19: qty 1

## 2020-09-19 MED ORDER — LACTATED RINGERS IV SOLN: INTRAVENOUS | Status: DC

## 2020-09-19 MED ORDER — PIPERACILLIN-TAZOBACTAM 3.375 G IVPB
3.3750 g | Freq: Three times a day (TID) | INTRAVENOUS | Status: DC
  Administered 2020-09-19 – 2020-09-20 (×3): 3.375 g via INTRAVENOUS
  Filled 2020-09-19 (×5): qty 50

## 2020-09-19 NOTE — ED Notes (Signed)
MD notified of pain level. New orders noted.

## 2020-09-19 NOTE — ED Triage Notes (Signed)
Pt is present for left groin pain with "cellulitis". Pt also c/o lower abd pain with nausea/vomiting and a fever. Pt was seen Friday for sx and has been taking prescribed abxs.

## 2020-09-19 NOTE — Sepsis Progress Note (Signed)
[facility] following for Sepsis Protocol 

## 2020-09-19 NOTE — ED Notes (Signed)
Pt is demanding a lot. Pt is asking for a recliner, icy hot, ice pack, hot pack, fan. Explained to patient that there are other patients and we can only accomodate so much.

## 2020-09-19 NOTE — ED Notes (Signed)
Pt sitting on the side of the bed. Pt placed on ice pak on back of neck and cold wash rag placed on forehead.

## 2020-09-19 NOTE — Progress Notes (Signed)
Pharmacy Antibiotic Note  Cheyenne Guzman is a 55 y.o. female admitted on 09/19/2020 with cellulitis.  Pharmacy has been consulted for vancomycin and zosyn dosing. Pt is febrile with Tmax 103.1 and WBC is WNL. SCr and lactic acid are WNL. First doses ordered per ED MD overnight.   Plan: Zosyn 3.375gm IV Q8H (4 hr inf) Vancomycin 1gm IV Q8H  F/u renal fxn, C&S, clinical status and peak/trough at SS  Height: '5\' 7"'$  (170.2 cm) Weight: (!) 212 kg (467 lb 6 oz) IBW/kg (Calculated) : 61.6  Temp (24hrs), Avg:101.2 F (38.4 C), Min:98.7 F (37.1 C), Max:103.1 F (39.5 C)  Recent Labs  Lab 09/16/20 1109 09/19/20 0238  WBC 6.2 10.3  CREATININE 0.91 1.04*  LATICACIDVEN  --  1.7    Estimated Creatinine Clearance: 117.5 mL/min (A) (by C-G formula based on SCr of 1.04 mg/dL (H)).    Allergies  Allergen Reactions   Vicodin [Hydrocodone-Acetaminophen] Nausea And Vomiting    Antimicrobials this admission: Vanc 8/29>> Zosyn 8/29>>  Dose adjustments this admission: N/A  Microbiology results: Pending  Thank you for allowing pharmacy to be a part of this patient's care.  Alecea Trego, Rande Lawman 09/19/2020 9:25 AM

## 2020-09-19 NOTE — ED Notes (Signed)
This RN and CT tech returned from CT scan. Pt attached to BP, 02, and cardiac monitor.

## 2020-09-19 NOTE — Telephone Encounter (Signed)
   Notes to clinic:  Review for refill Last appt was 07/20/2019  Last several appointments canceled  Nest appt is 10/14/2020   Requested Prescriptions  Pending Prescriptions Disp Refills   metFORMIN (GLUCOPHAGE) 1000 MG tablet [Pharmacy Med Name: METFORMIN HCL 1,000 MG TABLET] 90 tablet 3    Sig: TAKE 1 TABLET BY MOUTH EVERY DAY WITH BREAKFAST     There is no refill protocol information for this order     HUMALOG MIX 75/25 (75-25) 100 UNIT/ML SUSP injection [Pharmacy Med Name: HUMALOG MIX 75-25 VIAL] 70 mL 2    Sig: INJECT 110 UNITS INTO THE SKIN 2 TIMES DAILY WITH A MEAL.     There is no refill protocol information for this order

## 2020-09-19 NOTE — ED Provider Notes (Signed)
[facility] EMERGENCY DEPT Provider Note   CSN: [id number] Arrival date & time: 09/19/20  0216     History Chief Complaint  Patient presents with   Groin Pain   Cellulitis    Cheyenne Guzman is a 55 y.o. female.  HPI     This a 55 year old female with a history of breast cancer, recurrent cellulitis and sepsis with bacteremia, hyperlipidemia, PE who presents with fever, chills, worsening left groin pain.  Patient was seen and evaluated on Friday.  At that time she was presumptively treated for cellulitis after CT imaging showed some evidence of skin thickening.  She had groin pain at that time.  Patient states that this pain is similar to the pain she had in summer 2020 when she was admitted 3 times for recurrent cellulitis.  She was ultimately found to have Serratia bacteremia.  This was thought secondary to pyelonephritis although she also had recurrent cellulitis.  Patient states she had been doing well.  She was seen and evaluated on Friday for groin pain.  Had a negative DVT study.  She was placed on doxycycline.  She states she has been taking doxycycline.  However, symptoms have worsened.  Fever up to 103 today.  Rates her pain at 8 out of 10.  Also reports recurrent episodes of nonbilious, nonbloody emesis.  She states this is exactly how she presented with sepsis previously.  Past Medical History:  Diagnosis Date   Anxiety    per pt. - mild    Arthritis    back & ankles    Asthma    seasonal    Breast cancer ([place])    Cancer ([place])    breast, sees Dr. Jana Hakim , Left - 11/2007   Cellulitis 2020   severe - Left leg    Complication of anesthesia 2013   severe muscle spasms-sore-no doc   Depression    Diabetes mellitus    sees Dr. Chalmers Cater, diagnosed 2003   HA (headache)    Hearing loss    Went to [facility],Dr Laing,both ears   Hyperlipidemia    Kidney infection    - hosp. [facility]- septic- 10/2009   Low back pain    Peripheral neuropathy     R sided numbness- face & jaw   Pulmonary embolism ([place]) 2020   Sepsis ([place]) 2020   Sleep apnea 9/15   severe-just started cpap-doing well   TIA (transient ischemic attack)    in 2007,2008, and 2011   Tinnitus of right ear    Urosepsis 08-2009   with Klebsiella    Patient Active Problem List   Diagnosis Date Noted   Restless legs syndrome 04/14/2019   Dyslipidemia 11/07/2018   Type 2 diabetes mellitus with hyperglycemia, with long-term current use of insulin ([place]) 09/18/2018   Type 2 diabetes mellitus with diabetic polyneuropathy, with long-term current use of insulin ([place]) 09/18/2018   Recurrent cellulitis of lower extremity 07/31/2018   Serratia infection 07/31/2018   Morbid obesity with body mass index of 60.0-69.9 in adult [facility])    Sepsis ([place]) 06/19/2018   Chronic pain 06/19/2018   Pulmonary embolism ([place]) 03/12/2018   Diabetes mellitus type 2 in obese ([place]) 03/12/2018   Hypothyroidism 08/14/2017   Lymphedema of both lower extremities 10/18/2015   Diabetic neuropathy ([place]) 01/07/2014   Congenital spondylolysis of lumbosacral region 11/02/2013   Chronic LBP 10/08/2013   Obstructive sleep apnea 07/31/2013   Hearing loss 06/23/2013   Shortness of breath 11/27/2012  Cellulitis of left lower extremity 07/03/2012   DCIS (ductal carcinoma in situ) of breast, left, treated with [place] 11/2007 02/19/2011   Asthma 02/20/2008   Hyperlipidemia associated with type 2 diabetes mellitus ([place]) 11/26/2006   Anxiety and depression 11/26/2006    Past Surgical History:  Procedure Laterality Date   ABDOMINAL HYSTERECTOMY  08/2004   BREAST BIOPSY  06/22/2011   Procedure: BREAST BIOPSY;  Surgeon: Stark Klein, MD;  Location: [facility];  Service: General;  Laterality: Right;   BREAST LUMPECTOMY     BREAST SURGERY  11/2007   left ductal carcinoma in situ   CARPAL TUNNEL RELEASE Right 10/13/2013   Procedure: RIGHT CARPAL TUNNEL RELEASE;  Surgeon: Hessie Dibble, MD;  Location: [facility];  Service: Orthopedics;  Laterality: Right;   CARPAL TUNNEL RELEASE Left 11/24/2013   Procedure: LEFT CARPAL TUNNEL RELEASE WITH CYST EXCISION ;  Surgeon: Hessie Dibble, MD;  Location: [facility];  Service: Orthopedics;  Laterality: Left;   EAR CYST EXCISION Left 11/24/2013   Procedure: CYST REMOVAL;  Surgeon: Hessie Dibble, MD;  Location: [facility];  Service: Orthopedics;  Laterality: Left;   FOOT SURGERY  2000   plantar fasciitis-left   LUMBAR DISC SURGERY  03-29-14   Fusion, revision 04-27-14 L4-5 [place] with graft   NERVE GRAFT  06-01-08   cadaver graft to right inferior alveolar nerve  in [place]     OB History   No obstetric history on file.     Family History  Problem Relation Age of Onset   Cancer Mother        breast   Cancer Father        lung   Diabetes Father    Cancer Sister 62       colon   Cancer Maternal Aunt        breast - both   Diabetes Sister    Anesthesia problems Neg Hx     Social History   Tobacco Use   Smoking status: Former    Types: Cigarettes    Quit date: 02/19/1992    Years since quitting: 28.6   Smokeless tobacco: Never  Vaping Use   Vaping Use: Never used  Substance Use Topics   Alcohol use: Not Currently    Comment: rare   Drug use: No    Home Medications Prior to Admission medications   Medication Sig Start Date End Date Taking? Authorizing Provider  albuterol (PROVENTIL) (2.5 MG/3ML) 0.083% nebulizer solution 1 VIAL IN NEBULIZER EVERY 4 HOURS AS NEEDED FOR WHEEZING Patient taking differently: Take 2.5 mg by nebulization every 4 (four) hours as needed for wheezing. 10/13/13   Laurey Morale, MD  albuterol (VENTOLIN HFA) 108 (90 Base) MCG/ACT inhaler Inhale 2 puffs into the lungs every 4 (four) hours as needed for wheezing or shortness of breath. 12/05/18   Laurey Morale, MD  ALPRAZolam Duanne Moron) 1 MG tablet TAKE 1 TABLET BY MOUTH THREE TIMES A DAY Patient taking differently:  Patient takes as needed 03/06/18   Laurey Morale, MD  atorvastatin (LIPITOR) 10 MG tablet Take 1 tablet (10 mg total) by mouth daily. 04/27/20   Shamleffer, Melanie Crazier, MD  Blood Glucose Monitoring Suppl (CONTOUR NEXT MONITOR) w/Device KIT 1 each by Does not apply route 2 (two) times daily. To check blood sugars twice a day. 08/27/16   Renato Shin, MD  Buprenorphine 15 MCG/HR PTWK Place 1 patch onto the skin  every Saturday.  07/23/17   [provider]  cephALEXin (KEFLEX) 500 MG capsule Take 1 capsule three times daily for 10 days Patient not taking: Reported on 08/09/2020 07/26/20   Laurey Morale, MD  doxycycline (VIBRAMYCIN) 100 MG capsule Take 1 capsule (100 mg total) by mouth 2 (two) times daily. 09/16/20   Lucrezia Starch, MD  furosemide (LASIX) 40 MG tablet TAKE 1 TABLET BY MOUTH TWICE A DAY 05/17/20   Laurey Morale, MD  gabapentin (NEURONTIN) 300 MG capsule Take 300 mg by mouth 2 (two) times daily.     [provider]  glucose blood (CONTOUR NEXT TEST) test strip Use to test blood sugar 2 times daily 12/18/16   Renato Shin, MD  glucose blood test strip Use as instructed 08/24/16   Renato Shin, MD  HUMALOG MIX 75/25 (75-25) 100 UNIT/ML SUSP injection INJECT 110 UNITS INTO THE SKIN 2 TIMES DAILY WITH A MEAL. Patient taking differently: INJECT 120 UNITS INTO THE SKIN 2 TIMES DAILY WITH A MEAL. 06/16/20   Shamleffer, Melanie Crazier, MD  Insulin Syringe-Needle U-100 (INSULIN SYRINGE 1CC/31GX5/16") 31G X 5/16" 1 ML MISC 2x daily 07/20/19   Shamleffer, Melanie Crazier, MD  levothyroxine (SYNTHROID) 75 MCG tablet TAKE 1 TABLET BY MOUTH EVERY DAY 08/08/20   Laurey Morale, MD  lidocaine (XYLOCAINE) 5 % ointment Apply to areas of pain up to bid. 12/23/19   [provider]  metFORMIN (GLUCOPHAGE) 1000 MG tablet Take 1 tablet (1,000 mg total) by mouth daily with breakfast. 07/20/19   Shamleffer, Melanie Crazier, MD  methocarbamol (ROBAXIN) 750 MG tablet Take 1 tablet (750 mg  total) by mouth 2 (two) times daily. 02/03/15   Laurey Morale, MD  Multiple Vitamin (MULTIVITAMIN WITH MINERALS) TABS tablet Take 1 tablet by mouth daily.    [provider]  pramipexole (MIRAPEX) 0.5 MG tablet TAKE 1 TABLET BY MOUTH EVERYDAY AT BEDTIME 09/16/20   Laurey Morale, MD  Semaglutide,0.25 or 0.5MG/DOS, (OZEMPIC, 0.25 OR 0.5 MG/DOSE,) 2 MG/1.5ML SOPN Inject 0.375 mLs (0.5 mg total) into the skin once a week. Patient not taking: No sig reported 07/20/19   Shamleffer, Melanie Crazier, MD  sertraline (ZOLOFT) 100 MG tablet TAKE 2 TABLETS (200 MG TOTAL) BY MOUTH AT BEDTIME. 08/08/20   Laurey Morale, MD  XARELTO 20 MG TABS tablet TAKE 1 TABLET BY MOUTH EVERY DAY 10/26/19   Laurey Morale, MD    Allergies    Vicodin [hydrocodone-acetaminophen]  Review of Systems   Review of Systems  Constitutional:  Positive for chills and fever.  Respiratory:  Positive for shortness of breath.   Cardiovascular:  Negative for chest pain.  Gastrointestinal:  Positive for nausea and vomiting.  Genitourinary:  Negative for dysuria.       Groin pain  All other systems reviewed and are negative.  Physical Exam Updated Vital Signs BP 122/61   Pulse (!) 104   Temp (!) 102.6 F (39.2 C) (Oral)   Resp (!) 24   Ht 1.702 m (_0 )   Wt (!) 212 kg   SpO2 97%   BMI 73.20 kg/m   Physical Exam Vitals and nursing note reviewed.  Constitutional:      Appearance: She is well-developed. She is obese. She is ill-appearing. She is not toxic-appearing.  HENT:     Head: Normocephalic and atraumatic.     Nose: Nose normal.     Mouth/Throat:     Mouth: Mucous membranes are moist.  Eyes:     Pupils: Pupils are equal, round, and reactive to light.  Cardiovascular:     Rate and Rhythm: Regular rhythm. Tachycardia present.     Heart sounds: Normal heart sounds.  Pulmonary:     Effort: Pulmonary effort is normal. No respiratory distress.     Breath sounds: No wheezing.  Abdominal:     General: Bowel  sounds are normal.     Palpations: Abdomen is soft.     Tenderness: There is no abdominal tenderness.     Comments: Moisture and satellite lesions noted under the pannus, no skin thickening or significant erythema  Genitourinary:    Comments: No obvious groin swelling, overlying skin changes, or crepitus Exam severely limited secondary to patient's body habitus Musculoskeletal:        General: No swelling. Normal range of motion.     Cervical back: Neck supple.  Skin:    General: Skin is warm and dry.  Neurological:     Mental Status: She is alert and oriented to person, place, and time.  Psychiatric:        Mood and Affect: Mood normal.    ED Results / Procedures / Treatments   Labs (all labs ordered are listed, but only abnormal results are displayed) Labs Reviewed  CBC WITH DIFFERENTIAL/PLATELET - Abnormal; Notable for the following components:      Result Value   RBC 5.12 (*)    Hemoglobin 11.8 (*)    MCV 78.3 (*)    MCH 23.0 (*)    MCHC 29.4 (*)    RDW 18.5 (*)    Neutro Abs 9.0 (*)    Lymphs Abs 0.6 (*)    All other components within normal limits  COMPREHENSIVE METABOLIC PANEL - Abnormal; Notable for the following components:   Glucose, Bld 164 (*)    Creatinine, Ser 1.04 (*)    All other components within normal limits  URINALYSIS, ROUTINE W REFLEX MICROSCOPIC - Abnormal; Notable for the following components:   Hgb urine dipstick SMALL (*)    Bacteria, UA RARE (*)    All other components within normal limits  RESP PANEL BY RT-PCR (FLU A&B, COVID) ARPGX2  CULTURE, BLOOD (ROUTINE X 2)  CULTURE, BLOOD (ROUTINE X 2)  LACTIC ACID, PLASMA  LACTIC ACID, PLASMA    EKG EKG Interpretation  Date/Time:  Monday September 19 2020 03:22:52 EDT Ventricular Rate:  114 PR Interval:  152 QRS Duration: 100 QT Interval:  327 QTC Calculation: 451 R Axis:   73 Text Interpretation: Sinus tachycardia Low voltage, precordial leads Anteroseptal infarct, old Confirmed by Thayer Jew [phone]) on 09/19/2020 4:01:49 AM  Radiology CT ABDOMEN PELVIS W CONTRAST  Result Date: 09/19/2020 CLINICAL DATA:  Abdominal pain and fever. Left groin pain with "cellulitis". EXAM: CT ABDOMEN AND PELVIS WITH CONTRAST TECHNIQUE: Multidetector CT imaging of the abdomen and pelvis was performed using the standard protocol following bolus administration of intravenous contrast. CONTRAST:  162m OMNIPAQUE IOHEXOL 350 MG/ML SOLN COMPARISON:  09/16/2020 FINDINGS: Lower chest: Unremarkable. Hepatobiliary: The liver shows diffusely decreased attenuation suggesting fat deposition. Liver measures 20.8 cm craniocaudal length, enlarged. No suspicious focal abnormality within the liver parenchyma. There is no evidence for gallstones, gallbladder wall thickening, or pericholecystic fluid. No intrahepatic or extrahepatic biliary dilation. Pancreas: No focal mass lesion. No dilatation of the main duct. No intraparenchymal cyst. No peripancreatic edema. Spleen: 17 cm craniocaudal length.  No focal mass lesion. Adrenals/Urinary Tract: No adrenal nodule or mass. Right kidney unremarkable.  8 mm nonobstructing stone noted lower pole left kidney. No evidence for hydroureter. The urinary bladder appears normal for the degree of distention. Stomach/Bowel: Stomach is unremarkable. No gastric wall thickening. No evidence of outlet obstruction. Duodenum is normally positioned as is the ligament of Treitz. No small bowel wall thickening. No small bowel dilatation. The terminal ileum is normal. The appendix is normal. No gross colonic mass. No colonic wall thickening. Vascular/Lymphatic: No abdominal aortic aneurysm. There is no gastrohepatic or hepatoduodenal ligament lymphadenopathy. No retroperitoneal or mesenteric lymphadenopathy. 13 mm short axis left external iliac node on 83/3 is minimally more prominent than on the prior study with upper normal increased number of lymph nodes in the groin regions, left-greater-than-right,  similar to prior. Reproductive: Uterus surgically absent.  There is no adnexal mass. Other: No intraperitoneal free fluid. Musculoskeletal: Skin thickening and subcutaneous edema in the lower anterior abdominal wall similar to prior with diffuse body wall edema noted lower abdomen and pelvis. As before, cellulitis not excluded. No gas within the subcutaneous soft tissues of the abdomen/pelvis. Of note, the entire perineum is not been included on today's study. IMPRESSION: 1. 2. Skin thickening and subcutaneous edema in the lower anterior abdominal wall with diffuse body wall edema noted lower abdomen and pelvis. As before, cellulitis not excluded. No gas within the subcutaneous soft tissues of the abdomen/pelvis. Of note, the entire perineum is not included on today's study. 3. Hepatomegaly with hepatic steatosis. 4. Splenomegaly. 5. Nonobstructing left renal stone. Electronically Signed   By: Misty Stanley M.D.   On: 09/19/2020 05:21   DG Chest Portable 1 View  Result Date: 09/19/2020 CLINICAL DATA:  Fever EXAM: PORTABLE CHEST 1 VIEW COMPARISON:  04/09/2019 FINDINGS: Lungs are clear.  No pleural effusion or pneumothorax. The heart is normal in size. IMPRESSION: No evidence of acute cardiopulmonary disease. Electronically Signed   By: Julian Hy M.D.   On: 09/19/2020 02:50    Procedures .Critical Care  Date/Time: 09/19/2020 5:31 AM Performed by: Merryl Hacker, MD Authorized by: Merryl Hacker, MD   Critical care provider statement:    Critical care time (minutes):  50   Critical care was time spent personally by me on the following activities:  Discussions with consultants, evaluation of patient's response to treatment, examination of patient, ordering and performing treatments and interventions, ordering and review of laboratory studies, ordering and review of radiographic studies, pulse oximetry, re-evaluation of patient's condition, obtaining history from patient or surrogate and  review of old charts   Medications Ordered in ED Medications  lactated ringers infusion ( Intravenous New Bag/Given 09/19/20 0403)  acetaminophen (TYLENOL) tablet 650 mg (650 mg Oral Given 09/19/20 0309)  sodium chloride 0.9 % bolus 1,000 mL (0 mLs Intravenous Stopped 09/19/20 0406)  ondansetron (ZOFRAN) injection 4 mg (4 mg Intravenous Given 09/19/20 0309)  vancomycin (VANCOCIN) IVPB 1000 mg/200 mL premix (0 mg Intravenous Stopped 09/19/20 0406)  piperacillin-tazobactam (ZOSYN) IVPB 3.375 g (0 g Intravenous Stopped 09/19/20 0450)  acetaminophen (TYLENOL) tablet 650 mg (650 mg Oral Given 09/19/20 0411)  ibuprofen (ADVIL) tablet 600 mg (600 mg Oral Given 09/19/20 0411)  iohexol (OMNIPAQUE) 350 MG/ML injection 100 mL (100 mLs Intravenous Contrast Given 09/19/20 0432)    ED Course  I have reviewed the triage vital signs and the nursing notes.  Pertinent labs & imaging results that were available during my care of the patient were reviewed by me and considered in my medical decision making (see chart for details).  MDM Rules/Calculators/A&P                           Patient presents with nausea, vomiting, fever.  She is tachycardic and appears ill on exam.  She is nontoxic-appearing however.  Sepsis work-up initiated.  Significant history of cellulitis and Serratia bacteremia.  Had previous infectious disease consultations.  Her exam is very limited because of her body habitus.  There is no obvious significant erythema of the abdomen although there is some anasarca.  Additionally, there does not appear to be any crepitus or skin changes in the perineum on limited exam.  Labs obtained.  White count 10.3 but with a left shift.  This is increased from prior exam.  COVID testing negative.  Urinalysis without evidence of UTI.  Chest x-ray without pneumonia.  She is currently on doxycycline for presumed abdominal wall cellulitis.  Given limitations of exam, will get a CT of the abdomen pelvis for further  evaluation.  Most of her discomfort is in her left groin.  CT obtained.  She has persistent abdominal wall changes in the lower abdomen consistent with cellulitis.  There is no air.  Patient was covered with vancomycin and Zosyn.  She remained persistently febrile.  Given her history of positive blood cultures, will admit for broad-spectrum antibiotics until cultures result.  She has not been hypotensive.  She remains mildly tachycardic.   Final Clinical Impression(s) / ED Diagnoses Final diagnoses:  Abdominal wall cellulitis  Sepsis without acute organ dysfunction, due to unspecified organism [facility])    Rx / DC Orders ED Discharge Orders     None        Merryl Hacker, MD 09/19/20 [phone]

## 2020-09-19 NOTE — ED Notes (Signed)
MD notified of ongoing fever. New orders noted.

## 2020-09-19 NOTE — H&P (Signed)
History and Physical    Cheyenne Guzman TDS:[id number] DOB: 01/19/1966 DOA: 09/19/2020  PCP: Laurey Morale, MD  Cardiologist: Dr. Debara Pickett, for evaluation due to edema Patient coming from: Home  I have personally briefly reviewed patient's old medical records in [facility].  Chief Complaint: lower abdomen infection on abdominal wall  HPI: Cheyenne Guzman is a 55 y.o. female with medical history significant for obstructive sleep apnea and does wear CPAP at night, history of severe left leg cellulitis requiring multiple hospitalizations in 2020 and was refractory to treatment with oral antibiotics (required infectious disease involvement), history of breast cancer, diabetes, obesity, chronic low back pain, chronic severe lymphedema, who presents to the [facility] emergency department on 09/19/2020 with left groin and left inguinal pain along with left lower back and left hip pain.  Regarding left groin and left inguinal pain: Onset of patient's symptoms was 09/14/20 and duration was intermittent. At first it was just a small area of discomfort in her left inguinal area that went to her left leg. She woke up slightly after midnight on 09/16/20 with excruciating pain and was screaming and crying x 1 hour.  Pain is located in the left inguinal area and lower abdomen and radiates to to the left leg. It is up to 10/10 at times and is characterized as pulling and aching. Symptoms are alleviated by nothing and exacerbated by moving her leg.  She developed severe fever.  Associated symptoms: Nausea, vomiting. No abdominal pain except for the left inguinal area. Fever and chills. No diarrhea. Minimal, intermittent blood in stool that has been a chronic issue and she thinks it is due to hemorrhoids; no large volume in the days prior to admission. Last BM was 09/18/20 and it was not bloody. Had dark brown urine several weeks ago but no dysuria or hematuria. Chronic shortness of breath, coughing, and wheezing is  unchanged. No chest pain or heart racing.  Treatments: She went to ED on 09/16/20, was diagnosed with cellulitis, started on oral doxycycline. She started to feel a little better but then on 09/18/20 she had a return of the pain and felt severely bad. She developed a fever to 103.7 F at home.  Patient is concerned that she will have a recurrence of prolonged infection like she did in 2020 in her left leg.  Regarding left hip and left buttock pain: She has had long-term lower back and left buttock/hip pain that worsened over the last several months. In the last 2 weeks the pain has been gradually worsening and constantly present. Pain is located left buttock and left hip and currently does not radiate. It is up to 10/10 at times and is characterized as achy and hot/burning feeling. Associated symptoms: No change in urinary incontinence. No loss of bowel control. No new weakness. Has intermittent numbness in the left leg; felt like it "was asleep." No headache, slurred speech, or confusion.  She also notes that her lymphedema in her lower extremities has been worse; she has a large bulging area in the right upper thigh that has not been present as long as her other areas of lymphedema.  ED Course: There was concern that patient had cellulitis or other infection.  Cultures were ordered.  Vitals included temperature 102.6 F, pulse 104, respiratory rate 24.  Lactic acid was less than 2.  Chest x-ray did not show pneumonia.  CT of the abdomen and pelvis showed persistent abdominal wall changes in the lower abdomen consistent with cellulitis.  Patient did meet SIRS criteria and is at risk for sepsis, but she did not have evidence of endorgan damage.  Cultures were obtained.  Patient was started on broad-spectrum antibiotics including IV Zosyn and IV vancomycin.   History:  Had severe left leg infection in 2020 for which she was hospitalized x 3.  Every time she would be sent home on oral antibiotics she would  return with fevers and need for IV antibiotics.  She needed prolonged IV antibiotics.  She did have infectious disease follow-up.  Had cauda equina in past prior to back surgery and is thought to have had some nerve damage from that.   Finding of 0.9 mm kidney stone.  OSA: does use CPAP. She reports high setting of 20 cm of H2O.   Review of Systems: As per HPI otherwise all other systems reviewed and are unremarable.  No dysuria or hematuria.     Past Medical History:  Diagnosis Date   Anxiety    per pt. - mild    Arthritis    back & ankles    Asthma    seasonal    Breast cancer ([place])    Cancer ([place])    breast, sees Dr. Jana Hakim , Left - 11/2007   Cellulitis 2020   severe - Left leg    Complication of anesthesia 2013   severe muscle spasms-sore-no doc   Depression    Diabetes mellitus    sees Dr. Chalmers Cater, diagnosed 2003   HA (headache)    Hearing loss    Went to [facility] Thro[facility],Dr Laing,both ears   Hyperlipidemia    Kidney infection    - hosp. [facility]- septic- 10/2009   Low back pain    Peripheral neuropathy    R sided numbness- face & jaw   Pulmonary embolism ([place]) 2020   Sepsis ([place]) 2020   Sleep apnea 9/15   severe-just started cpap-doing well   TIA (transient ischemic attack)    in 2007,2008, and 2011   Tinnitus of right ear    Urosepsis 08-2009   with Klebsiella   Pulmonary emboli 03/2018 bilateral. None since. Has been taking Xarelto.   Past Surgical History:  Procedure Laterality Date   ABDOMINAL HYSTERECTOMY  08/2004   BREAST BIOPSY  06/22/2011   Procedure: BREAST BIOPSY;  Surgeon: Stark Klein, MD;  Location: [place];  Service: General;  Laterality: Right;   BREAST LUMPECTOMY     BREAST SURGERY  11/2007   left ductal carcinoma in situ   CARPAL TUNNEL RELEASE Right 10/13/2013   Procedure: RIGHT CARPAL TUNNEL RELEASE;  Surgeon: Hessie Dibble, MD;  Location: [place];  Service: Orthopedics;  Laterality: Right;   CARPAL  TUNNEL RELEASE Left 11/24/2013   Procedure: LEFT CARPAL TUNNEL RELEASE WITH CYST EXCISION ;  Surgeon: Hessie Dibble, MD;  Location: [place];  Service: Orthopedics;  Laterality: Left;   EAR CYST EXCISION Left 11/24/2013   Procedure: CYST REMOVAL;  Surgeon: Hessie Dibble, MD;  Location: [place];  Service: Orthopedics;  Laterality: Left;   FOOT SURGERY  2000   plantar fasciitis-left   LUMBAR DISC SURGERY  03-29-14   Fusion, revision 04-27-14 L4-5 Cauda Equina with graft   NERVE GRAFT  06-01-08   cadaver graft to right inferior alveolar nerve  in [place]    Social History  reports that she quit smoking about 28 years ago. Her smoking use included cigarettes. She has never used  smokeless tobacco. She reports that she does not currently use alcohol. She reports that she does not use drugs.  Allergies  Allergen Reactions   Vicodin [Hydrocodone-Acetaminophen] Nausea And Vomiting    Family History  Problem Relation Age of Onset   Cancer Mother        breast   Cancer Father        lung   Diabetes Father    Cancer Sister 33       colon   Cancer Maternal Aunt        breast - both   Diabetes Sister    Anesthesia problems Neg Hx      Home Medications  Prior to Admission medications   Medication Sig Start Date End Date Taking? Authorizing Provider  albuterol (PROVENTIL) (2.5 MG/3ML) 0.083% nebulizer solution 1 VIAL IN NEBULIZER EVERY 4 HOURS AS NEEDED FOR WHEEZING Patient taking differently: Take 2.5 mg by nebulization every 4 (four) hours as needed for wheezing. 10/13/13  Yes Laurey Morale, MD  albuterol (VENTOLIN HFA) 108 (90 Base) MCG/ACT inhaler Inhale 2 puffs into the lungs every 4 (four) hours as needed for wheezing or shortness of breath. 12/05/18  Yes Laurey Morale, MD  ALPRAZolam Duanne Moron) 1 MG tablet TAKE 1 TABLET BY MOUTH THREE TIMES A DAY Patient taking differently: Take 1 mg by mouth 3 (three) times daily as needed for anxiety. 03/06/18   Yes Laurey Morale, MD  atorvastatin (LIPITOR) 10 MG tablet Take 1 tablet (10 mg total) by mouth daily. Patient taking differently: Take 10 mg by mouth every morning. 04/27/20  Yes Shamleffer, Melanie Crazier, MD  Buprenorphine 15 MCG/HR PTWK Place 1 patch onto the skin every Monday. 07/23/17  Yes [provider]  Cholecalciferol (VITAMIN D3) 50 MCG (2000 UT) TABS Take 2,000 Units by mouth every morning.   Yes [provider]  doxycycline (VIBRAMYCIN) 100 MG capsule Take 1 capsule (100 mg total) by mouth 2 (two) times daily. 09/16/20  Yes Lucrezia Starch, MD  furosemide (LASIX) 40 MG tablet TAKE 1 TABLET BY MOUTH TWICE A DAY Patient taking differently: Take 40 mg by mouth See admin instructions. Take one tablet (40 mg) by mouth daily when not going out - approximately 5 times week 05/17/20  Yes Laurey Morale, MD  gabapentin (NEURONTIN) 100 MG capsule Take 100 mg by mouth 2 (two) times daily. Take with a 300 mg capsule for a total dose of 400 mg twice daily 08/31/20  Yes [provider]  gabapentin (NEURONTIN) 300 MG capsule Take 300 mg by mouth 2 (two) times daily. Take with a 100 mg capsule for a total dose of 400 mg twice daily   Yes [provider]  insulin lispro protamine-lispro (HUMALOG MIX 75/25) (75-25) 100 UNIT/ML SUSP injection INJECT 110 UNITS INTO THE SKIN 2 TIMES DAILY WITH A MEAL. Patient taking differently: Inject 120 Units into the skin 2 (two) times daily with a meal. 09/19/20  Yes Shamleffer, Melanie Crazier, MD  levothyroxine (SYNTHROID) 75 MCG tablet TAKE 1 TABLET BY MOUTH EVERY DAY Patient taking differently: Take 75 mcg by mouth daily before breakfast. 08/08/20  Yes Laurey Morale, MD  lidocaine (XYLOCAINE) 5 % ointment Apply 1 application topically 2 (two) times daily as needed (pain). 12/23/19  Yes [provider]  metFORMIN (GLUCOPHAGE) 1000 MG tablet TAKE 1 TABLET BY MOUTH EVERY DAY WITH BREAKFAST Patient taking differently: Take 1,000  mg by mouth every morning. 09/19/20  Yes Shamleffer, Melanie Crazier, MD  methocarbamol (ROBAXIN) 750 MG tablet Take 1 tablet (750 mg total) by mouth 2 (two) times daily. Patient taking differently: Take 750 mg by mouth every morning. 02/03/15  Yes Laurey Morale, MD  Multiple Vitamin (MULTIVITAMIN WITH MINERALS) TABS tablet Take 1 tablet by mouth daily.   Yes [provider]  Multiple Vitamins-Minerals (AIRBORNE GUMMIES) CHEW Chew 3 tablets by mouth every morning.   Yes [provider]  pramipexole (MIRAPEX) 0.5 MG tablet TAKE 1 TABLET BY MOUTH EVERYDAY AT BEDTIME Patient taking differently: Take 0.5 mg by mouth at bedtime. For restless legs 09/16/20  Yes Laurey Morale, MD  sertraline (ZOLOFT) 100 MG tablet TAKE 2 TABLETS (200 MG TOTAL) BY MOUTH AT BEDTIME. Patient taking differently: Take 200 mg by mouth every morning. 08/08/20  Yes Laurey Morale, MD  XARELTO 20 MG TABS tablet TAKE 1 TABLET BY MOUTH EVERY DAY Patient taking differently: Take 20 mg by mouth every morning. 10/26/19  Yes Laurey Morale, MD  Blood Glucose Monitoring Suppl (CONTOUR NEXT MONITOR) w/Device KIT 1 each by Does not apply route 2 (two) times daily. To check blood sugars twice a day. 08/27/16   Renato Shin, MD  glucose blood (CONTOUR NEXT TEST) test strip Use to test blood sugar 2 times daily 12/18/16   Renato Shin, MD  glucose blood test strip Use as instructed 08/24/16   Renato Shin, MD  Insulin Syringe-Needle U-100 (INSULIN SYRINGE 1CC/31GX5/16") 31G X 5/16" 1 ML MISC 2x daily 07/20/19   Shamleffer, Melanie Crazier, MD  Semaglutide,0.25 or 0.5MG/DOS, (OZEMPIC, 0.25 OR 0.5 MG/DOSE,) 2 MG/1.5ML SOPN Inject 0.375 mLs (0.5 mg total) into the skin once a week. Patient not taking: No sig reported 07/20/19   Shamleffer, Melanie Crazier, MD    Physical Exam: Vitals:   09/19/20 1004 09/19/20 1016 09/19/20 1324 09/19/20 1511  BP:  (!) 103/52 (!) 90/51 133/68  Pulse: 96 89 82 83  Resp: (!) 22 (!) 22 (!) 24 (!)  22  Temp: 99.3 F (37.4 C) 99.3 F (37.4 C) 98.2 F (36.8 C) 99.6 F (37.6 C)  TempSrc: Oral Oral Oral Oral  SpO2: 98% 98% 94% 97%  Weight:      Height:        Constitutional: calm, severe obesity, ill-appearing. Vitals:   09/19/20 1004 09/19/20 1016 09/19/20 1324 09/19/20 1511  BP:  (!) 103/52 (!) 90/51 133/68  Pulse: 96 89 82 83  Resp: (!) 22 (!) 22 (!) 24 (!) 22  Temp: 99.3 F (37.4 C) 99.3 F (37.4 C) 98.2 F (36.8 C) 99.6 F (37.6 C)  TempSrc: Oral Oral Oral Oral  SpO2: 98% 98% 94% 97%  Weight:      Height:       Eyes: Pupils equal and round, lids and conjunctivae without icterus or erythema. ENMT: Mucous membranes are dry. Posterior pharynx clear of any exudate or lesions. Nares patent without discharge or bleeding.  Normocephalic, atraumatic.  Normal dentition.  Neck: normal, supple, no masses, trachea midline.  Thyroid nontender, no masses appreciated, no thyromegaly.  Large neck circumference. Respiratory: clear to auscultation bilaterally. Chest wall movements are symmetric. No wheezing, no crackles.  No rhonchi.  Normal respiratory effort. No accessory muscle use.  Cardiovascular: Regular rate and rhythm, no murmurs / rubs / gallops. Pulses: Radial and DP pulses 2+ bilaterally. No carotid bruits.  Capillary refill less than 3 seconds. Edema: Massive pitting and nonpitting edema in the lower extremities bilaterally.  Area of edema of the upper right thigh  is also tender to palpation. GI: soft, non-distended, normal active bowel sounds. No hepatosplenomegaly appreciated on exam. No rigidity, rebound, or guarding. No masses palpated.  Left inguinal area and lower left abdominal pannus with tenderness to palpation and slight erythema. Musculoskeletal: no clubbing / cyanosis. No joint deformity upper extremities. No contractures. Normal muscle tone.  No deformity in the back bilaterally.  Tenderness to palpation in the lower back midline, left lower back, left buttock, and  left hip area. Integument: no ulcers. Clean.  Left inguinal area and lower left abdominal pannus with slight erythema, thickening of the skin, area is firm and tender. Neurologic: CN 2-12 grossly intact. Sensation grossly intact to light touch. DTR difficult to obtain bilaterally.  Babinski: Toes nonreactive bilaterally.  Strength 5/5 in upper extremities and 3/5 in lower extremities. Intact rapid alternating movements bilaterally.  No pronator drift. Psychiatric: Normal judgment and insight. Alert and oriented x 3. Normal mood.  Normal and appropriate affect. Lymphatic: No cervical lymphadenopathy. No supraclavicular lymphadenopathy.  Massive lymphedema of the lower extremities.   Labs on Admission: I have personally reviewed the following labs and imaging studies.  CBC: Recent Labs  Lab 09/16/20 1109 09/19/20 0238  WBC 6.2 10.3  NEUTROABS  --  9.0*  HGB 11.3* 11.8*  HCT 37.9 40.1  MCV 77.3* 78.3*  PLT 181 675    Basic Metabolic Panel: Recent Labs  Lab 09/16/20 1109 09/19/20 0238  NA 139 139  K 4.3 4.4  CL 104 102  CO2 27 28  GLUCOSE 199* 164*  BUN 19 19  CREATININE 0.91 1.04*  CALCIUM 9.2 9.3    GFR: Estimated Creatinine Clearance: 117.5 mL/min (A) (by C-G formula based on SCr of 1.04 mg/dL (H)).  Liver Function Tests: Recent Labs  Lab 09/16/20 1109 09/19/20 0238  AST 18 22  ALT 16 15  ALKPHOS 84 80  BILITOT 0.4 0.7  PROT 6.9 7.3  ALBUMIN 3.7 4.1    Urine analysis:    Component Value Date/Time   COLORURINE YELLOW 09/19/2020 0238   APPEARANCEUR CLEAR 09/19/2020 0238   APPEARANCEUR Clear 07/31/2013 1404   LABSPEC 1.021 09/19/2020 0238   PHURINE 6.0 09/19/2020 0238   GLUCOSEU NEGATIVE 09/19/2020 0238   HGBUR SMALL (A) 09/19/2020 0238   HGBUR negative 09/28/2009 1044   BILIRUBINUR NEGATIVE 09/19/2020 0238   BILIRUBINUR neg 05/19/2020 1108   BILIRUBINUR Negative 07/31/2013 1404   KETONESUR NEGATIVE 09/19/2020 0238   PROTEINUR NEGATIVE 09/19/2020 0238    UROBILINOGEN 0.2 05/19/2020 1108   UROBILINOGEN 0.2 07/03/2012 1027   NITRITE NEGATIVE 09/19/2020 0238   LEUKOCYTESUR NEGATIVE 09/19/2020 0238    Radiological Exams on Admission: CT ABDOMEN PELVIS W CONTRAST  Result Date: 09/19/2020 CLINICAL DATA:  Abdominal pain and fever. Left groin pain with "cellulitis". EXAM: CT ABDOMEN AND PELVIS WITH CONTRAST TECHNIQUE: Multidetector CT imaging of the abdomen and pelvis was performed using the standard protocol following bolus administration of intravenous contrast. CONTRAST:  190m OMNIPAQUE IOHEXOL 350 MG/ML SOLN COMPARISON:  09/16/2020 FINDINGS: Lower chest: Unremarkable. Hepatobiliary: The liver shows diffusely decreased attenuation suggesting fat deposition. Liver measures 20.8 cm craniocaudal length, enlarged. No suspicious focal abnormality within the liver parenchyma. There is no evidence for gallstones, gallbladder wall thickening, or pericholecystic fluid. No intrahepatic or extrahepatic biliary dilation. Pancreas: No focal mass lesion. No dilatation of the main duct. No intraparenchymal cyst. No peripancreatic edema. Spleen: 17 cm craniocaudal length.  No focal mass lesion. Adrenals/Urinary Tract: No adrenal nodule or mass. Right kidney unremarkable. 8 mm  nonobstructing stone noted lower pole left kidney. No evidence for hydroureter. The urinary bladder appears normal for the degree of distention. Stomach/Bowel: Stomach is unremarkable. No gastric wall thickening. No evidence of outlet obstruction. Duodenum is normally positioned as is the ligament of Treitz. No small bowel wall thickening. No small bowel dilatation. The terminal ileum is normal. The appendix is normal. No gross colonic mass. No colonic wall thickening. Vascular/Lymphatic: No abdominal aortic aneurysm. There is no gastrohepatic or hepatoduodenal ligament lymphadenopathy. No retroperitoneal or mesenteric lymphadenopathy. 13 mm short axis left external iliac node on 83/3 is minimally more  prominent than on the prior study with upper normal increased number of lymph nodes in the groin regions, left-greater-than-right, similar to prior. Reproductive: Uterus surgically absent.  There is no adnexal mass. Other: No intraperitoneal free fluid. Musculoskeletal: Skin thickening and subcutaneous edema in the lower anterior abdominal wall similar to prior with diffuse body wall edema noted lower abdomen and pelvis. As before, cellulitis not excluded. No gas within the subcutaneous soft tissues of the abdomen/pelvis. Of note, the entire perineum is not been included on today's study. IMPRESSION: 1. 2. Skin thickening and subcutaneous edema in the lower anterior abdominal wall with diffuse body wall edema noted lower abdomen and pelvis. As before, cellulitis not excluded. No gas within the subcutaneous soft tissues of the abdomen/pelvis. Of note, the entire perineum is not included on today's study. 3. Hepatomegaly with hepatic steatosis. 4. Splenomegaly. 5. Nonobstructing left renal stone. Electronically Signed   By: Misty Stanley M.D.   On: 09/19/2020 05:21   DG Chest Portable 1 View  Result Date: 09/19/2020 CLINICAL DATA:  Fever EXAM: PORTABLE CHEST 1 VIEW COMPARISON:  04/09/2019 FINDINGS: Lungs are clear.  No pleural effusion or pneumothorax. The heart is normal in size. IMPRESSION: No evidence of acute cardiopulmonary disease. Electronically Signed   By: Julian Hy M.D.   On: 09/19/2020 02:50    EKG: Independently reviewed.  114 bpm.  Sinus tachycardia.  Assessment/Plan Principal Problem:   Cellulitis, abdominal wall Active Problems:   Left buttock pain   Type 2 diabetes mellitus with hyperglycemia, with long-term current use of insulin (HCC)   Obstructive sleep apnea   Lymphedema of both lower extremities   Hypothyroidism   Chronic pain   Morbid obesity with body mass index of 60.0-69.9 in adult [employer])   History of pulmonary embolism   Current use of long term anticoagulation    Fatty liver   Splenomegaly   Left nephrolithiasis    Principal Problem:   Cellulitis, abdominal wall Possible diagnosis.  Patient had a fever up to 712 at home and certainly has an infection.  She could have bacteremia or other cause.  She did meet SIRS criteria but did not have evidence of endorgan damage.  She is at risk for sepsis. Plan: Cultures ordered.  Empiric IV Zosyn and vancomycin.  Active Problems:   Left buttock pain Could be referred pain from back or could be related to hip.  CT scan not revealing of etiology.  Plan: As needed pain medication.  Will need outpatient follow-up.  Type 2 diabetes mellitus with hyperglycemia, with long-term current use of insulin (HCC) Plan: Hold oral diabetes medications.  Check fingerstick blood sugars q ac and hs.  Sliding scale insulin.   Will use long-acting insulin but lower the dose considerably because patient may have changes in oral intake and glucose levels.  Increase long-acting insulin as needed.    Obstructive sleep apnea Plan: CPAP at  night.  Per patient home setting is 20 cm H2O.    Lymphedema of both lower extremities Plan: Continue support hose and supportive care.  Very extreme case.  Continue outpatient follow-up.    Hypothyroidism Plan: Synthroid.    Chronic pain Plan: Continue outpatient follow-up.    Morbid obesity with body mass index of 60.0-69.9 in adult [facility]) Plan: Bariatric bed.  Outpatient follow-up.    History of pulmonary embolism and Current use of long term anticoagulation Plan: Continue anticoagulation.    Fatty liver and    Splenomegaly and    Left nephrolithiasis Findings on CT scan included fatty liver, splenomegaly, and left nephrolithiasis. Plan: Patient informed of findings and told to follow-up with primary care.     DVT prophylaxis: Xarelto.  Code Status:   Full Code Family Communication:  With patient's daughters at bedside.   Disposition Plan:   Patient is  from:  Home  Anticipated DC to:  Home  Anticipated DC date:  09/28/2020.  Anticipated DC barriers: History of recurrent failed outpatient management with oral antibiotics when patient has had infections in the past.  Consults called:  None. Admission status:  Inpatient.  Severity of Illness: The appropriate patient status for this patient is INPATIENT. Inpatient status is judged to be reasonable and necessary in order to provide the required intensity of service to ensure the patient's safety. The patient's presenting symptoms, physical exam findings, and initial radiographic and laboratory data in the context of their chronic comorbidities is felt to place them at high risk for further clinical deterioration. Furthermore, it is not anticipated that the patient will be medically stable for discharge from the hospital within 2 midnights of admission. The following factors support the patient status of inpatient.   " The patient's presenting symptoms include significant pain in the left inguinal area and left hip/left buttock. " The worrisome physical exam findings include tenderness to palpation in the affected areas, severe lymphedema. " The initial radiographic and laboratory data are worrisome because of thickening of the skin with concern for cellulitis.. " The chronic co-morbidities include severe obesity, severe lymphedema, chronic pain, sleep apnea.   * I certify that at the point of admission it is my clinical judgment that the patient will require inpatient hospital care spanning beyond 2 midnights from the point of admission due to high intensity of service, high risk for further deterioration and high frequency of surveillance required.Tacey Ruiz MD [facility]  How to contact the [facility] Attending or Consulting provider [place] or covering provider during after hours [place], for this patient?   Check the care team in [facility] and look for a) attending/consulting [facility] provider  listed and b) the [facility] team listed Log into [email or web address] and use 's universal password to access. If you do not have the password, please contact the hospital operator. Locate the [facility] provider you are looking for under [facility] and page to a number that you can be directly reached. If you still have difficulty reaching the provider, please page the [facility] (Director on Call) for the Hospitalists listed on amion for assistance.  09/19/2020, 7:02 PM

## 2020-09-20 ENCOUNTER — Inpatient Hospital Stay (HOSPITAL_COMMUNITY): Payer: Medicare Other

## 2020-09-20 DIAGNOSIS — R112 Nausea with vomiting, unspecified: Secondary | ICD-10-CM

## 2020-09-20 DIAGNOSIS — N200 Calculus of kidney: Secondary | ICD-10-CM

## 2020-09-20 DIAGNOSIS — M009 Pyogenic arthritis, unspecified: Secondary | ICD-10-CM

## 2020-09-20 DIAGNOSIS — K760 Fatty (change of) liver, not elsewhere classified: Secondary | ICD-10-CM | POA: Diagnosis not present

## 2020-09-20 DIAGNOSIS — Z794 Long term (current) use of insulin: Secondary | ICD-10-CM

## 2020-09-20 DIAGNOSIS — R310 Gross hematuria: Secondary | ICD-10-CM

## 2020-09-20 DIAGNOSIS — R509 Fever, unspecified: Secondary | ICD-10-CM

## 2020-09-20 DIAGNOSIS — R1032 Left lower quadrant pain: Secondary | ICD-10-CM

## 2020-09-20 DIAGNOSIS — I890 Lymphedema, not elsewhere classified: Secondary | ICD-10-CM | POA: Diagnosis not present

## 2020-09-20 DIAGNOSIS — E1165 Type 2 diabetes mellitus with hyperglycemia: Secondary | ICD-10-CM

## 2020-09-20 DIAGNOSIS — G4733 Obstructive sleep apnea (adult) (pediatric): Secondary | ICD-10-CM | POA: Diagnosis not present

## 2020-09-20 LAB — GLUCOSE, CAPILLARY
Glucose-Capillary: 201 mg/dL — ABNORMAL HIGH (ref 70–99)
Glucose-Capillary: 277 mg/dL — ABNORMAL HIGH (ref 70–99)
Glucose-Capillary: 258 mg/dL — ABNORMAL HIGH (ref 70–99)
Glucose-Capillary: 285 mg/dL — ABNORMAL HIGH (ref 70–99)

## 2020-09-20 LAB — BASIC METABOLIC PANEL
Anion gap: 8 (ref 5–15)
BUN: 20 mg/dL (ref 6–20)
CO2: 25 mmol/L (ref 22–32)
Calcium: 8.7 mg/dL — ABNORMAL LOW (ref 8.9–10.3)
Chloride: 105 mmol/L (ref 98–111)
Creatinine, Ser: 1.06 mg/dL — ABNORMAL HIGH (ref 0.44–1.00)
GFR, Estimated: >60 mL/min (ref >60)
Glucose, Bld: 207 mg/dL — ABNORMAL HIGH (ref 70–99)
Potassium: 4.2 mmol/L (ref 3.5–5.1)
Sodium: 138 mmol/L (ref 135–145)

## 2020-09-20 LAB — CBC
HCT: 35.3 % — ABNORMAL LOW (ref 36.0–46.0)
Hemoglobin: 10.6 g/dL — ABNORMAL LOW (ref 12.0–15.0)
MCH: 23.8 pg — ABNORMAL LOW (ref 26.0–34.0)
MCHC: 30 g/dL (ref 30.0–36.0)
MCV: 79.1 fL — ABNORMAL LOW (ref 80.0–100.0)
Platelets: 147 10*3/uL — ABNORMAL LOW (ref 150–400)
RBC: 4.46 MIL/uL (ref 3.87–5.11)
RDW: 18.3 % — ABNORMAL HIGH (ref 11.5–15.5)
WBC: 8.7 10*3/uL (ref 4.0–10.5)
nRBC: 0 % (ref 0.0–0.2)

## 2020-09-20 LAB — CK: Total CK: 135 U/L (ref 38–234)

## 2020-09-20 LAB — HIV ANTIBODY (ROUTINE TESTING W REFLEX): HIV Screen 4th Generation wRfx: NONREACTIVE

## 2020-09-20 MED ORDER — FUROSEMIDE 40 MG PO TABS
40.0000 mg | ORAL_TABLET | Freq: Once | ORAL | Status: AC
  Administered 2020-09-20: 40 mg via ORAL
  Filled 2020-09-20: qty 1

## 2020-09-20 NOTE — Assessment & Plan Note (Addendum)
-   limited mobility; last cardiology visit on 08/09/20 was virtual for patient comfort - Complicates overall prognosis and care - Body mass index is 70.48 kg/m.

## 2020-09-20 NOTE — Consult Note (Signed)
Date of Admission:  09/19/2020          Reason for Consult: Fevers, hip and groin pain   Referring Provider: Dwyane Dee, MD   Assessment:  Likely left septic hip Kidney stone left side non obstructive History of recurrent lower extremity cellulitis Lymphedema Morbid obesity Type 2 diabetes mellitus  Plan:  MRI left hip with and without contrast Dc antibiotics at this point to increase yield on potential intraoperative cultures We will send urine analysis and if with pyuria will send for culture Follow-up on blood culture data  Principal Problem:   Groin pain, left Active Problems:   Obstructive sleep apnea   Lymphedema of both lower extremities   Hypothyroidism   Chronic pain   Morbid obesity with BMI of 70 and over, adult ([place])   Type 2 diabetes mellitus with hyperglycemia, with long-term current use of insulin (HCC)   History of pulmonary embolism   Fatty liver   Splenomegaly   Left nephrolithiasis   Scheduled Meds:  atorvastatin  10 mg Oral Daily   buprenorphine  1 patch Transdermal Weekly   cholecalciferol  2,000 Units Oral q morning   gabapentin  400 mg Oral BID   insulin aspart  0-15 Units Subcutaneous TID WC   insulin detemir  50 Units Subcutaneous BID   levothyroxine  75 mcg Oral QAC breakfast   methocarbamol  750 mg Oral q morning   multivitamin with minerals  1 tablet Oral Daily   pramipexole  0.5 mg Oral QHS   rivaroxaban  20 mg Oral Q supper   sertraline  200 mg Oral q morning   Continuous Infusions: PRN Meds:.acetaminophen **OR** acetaminophen, albuterol, bisacodyl, metoprolol tartrate, morphine injection, ondansetron **OR** ondansetron (ZOFRAN) IV, oxyCODONE, senna-docusate  HPI: Cheyenne Guzman is a 55 y.o. female with past medical history significant for morbid obesity diabetes mellitus history of lymphedema and recurrent cellulitis presented to the emergency department with acute onset of intense left-sided groin pain and hip pain along  with fevers nausea and vomiting.  She notes she has had lower back pain with some sciatic type symptoms on the left side similar to what she had on the right and has had this for several months but the new hip pain and groin pain was acute in onset.  In the ER she was febrile to over 103 degrees.  In the ER she underwent imaging including CT scan of the abdomen and pelvis which showed a nonobstructive renal stone on the left.  Radiology suggested the possibility of cellulitis in her abdominal wall but she has [place] no erythema or tenderness whatsoever on exam.  She had noted her urine to be quite dark though she has not had dysuria or flank pain.  Her urine analysis done in the ER does show 6-10 red blood cells and 0-5 squamous epithelia but no white cells were commented on.  Blood cultures were taken but urine cultures not taken.  She was started on vancomycin and Zosyn.  On exam she has significant groin pain with external and internal rotation of her left hip joint highly concerning for septic left hip.  I am stopping her antibiotics for now so that if she ends up having a septic hip and goes to the operating room for I&D we can increase the yield on cultures.  I am re-sending a urine analysis since the one done in the ER failed to show if there was pyuria.  She herself does not have dysuria.  I would be surprised if she has bacteria in the urine but her story to me is not terribly compelling for urinary tract infection she certainly does have a kidney stone and hematuria.  Will monitor closely in particular follow-up blood cultures since she clearly has evidence of being systemically ill.  Again there is 0 evidence of cellulitis on her abdomen or in her lower extremities where she has some chronic venous stasis changes.   I spent 84 minutes with the patient including greater than 50% of the time in face to face counseling of the patient regarding the work-up for fever and left hip and groin  pain, personally reviewing CT abdomen pelvis performed on admission as well as chest x-ray blood culture data CBC with differential, metabolic panel along with eview of medical records in preparation for the visit and during the visit and in coordination of her care.        Review of Systems: Review of Systems  Constitutional:  Positive for chills and fever. Negative for malaise/fatigue and weight loss.  HENT:  Negative for congestion and sore throat.   Eyes:  Negative for blurred vision and photophobia.  Respiratory:  Negative for cough, shortness of breath and wheezing.   Cardiovascular:  Negative for chest pain, palpitations and leg swelling.  Gastrointestinal:  Positive for nausea and vomiting. Negative for abdominal pain, blood in stool, constipation, diarrhea, heartburn and melena.  Genitourinary:  Positive for hematuria. Negative for dysuria and flank pain.  Musculoskeletal:  Positive for back pain and myalgias. Negative for falls and joint pain.  Skin:  Negative for itching and rash.  Neurological:  Positive for weakness. Negative for dizziness, focal weakness, loss of consciousness and headaches.  Endo/Heme/Allergies:  Does not bruise/bleed easily.  Psychiatric/Behavioral:  Negative for depression, hallucinations, substance abuse and suicidal ideas. The patient is nervous/anxious. The patient does not have insomnia.    Past Medical History:  Diagnosis Date   Anxiety    per pt. - mild    Arthritis    back & ankles    Asthma    seasonal    Breast cancer ([place])    Cancer ([place])    breast, sees Dr. Jana Hakim , Left - 11/2007   Cellulitis 2020   severe - Left leg    Complication of anesthesia 2013   severe muscle spasms-sore-no doc   Depression    Diabetes mellitus    sees Dr. Chalmers Cater, diagnosed 2003   HA (headache)    Hearing loss    Went to [facility],Dr Laing,both ears   Hyperlipidemia    Kidney infection    - hosp. [facility]- septic- 10/2009   Low back  pain    Peripheral neuropathy    R sided numbness- face & jaw   Pulmonary embolism ([place]) 2020   Sepsis ([place]) 2020   Sleep apnea 9/15   severe-just started cpap-doing well   TIA (transient ischemic attack)    in 2007,2008, and 2011   Tinnitus of right ear    Urosepsis 08-2009   with Klebsiella    Social History   Tobacco Use   Smoking status: Former    Types: Cigarettes    Quit date: 02/19/1992    Years since quitting: 28.6   Smokeless tobacco: Never  Vaping Use   Vaping Use: Never used  Substance Use Topics   Alcohol use: Not Currently    Comment: rare   Drug use: No    Family History  Problem  Relation Age of Onset   Cancer Mother        breast   Cancer Father        lung   Diabetes Father    Cancer Sister 66       colon   Cancer Maternal Aunt        breast - both   Diabetes Sister    Anesthesia problems Neg Hx    Allergies  Allergen Reactions   Vicodin [Hydrocodone-Acetaminophen] Nausea And Vomiting    OBJECTIVE: Blood pressure (!) 151/71, pulse 85, temperature 100 F (37.8 C), temperature source Oral, resp. rate 20, height '5\' 7"'$  (1.702 m), weight (!) 204.1 kg, SpO2 90 %.  Physical Exam Constitutional:      General: She is not in acute distress.    Appearance: Normal appearance. She is well-developed. She is not ill-appearing or diaphoretic.  HENT:     Head: Normocephalic and atraumatic.     Right Ear: Hearing and external ear normal.     Left Ear: Hearing and external ear normal.     Nose: No nasal deformity or rhinorrhea.  Eyes:     General: No scleral icterus.       Right eye: No discharge.        Left eye: No discharge.     Extraocular Movements: Extraocular movements intact.     Conjunctiva/sclera: Conjunctivae normal.     Right eye: Right conjunctiva is not injected.     Left eye: Left conjunctiva is not injected.     Pupils: Pupils are equal, round, and reactive to light.  Neck:     Vascular: No JVD.  Cardiovascular:     Rate and Rhythm:  Normal rate and regular rhythm.     Heart sounds: Normal heart sounds, S1 normal and S2 normal. No murmur heard.   No friction rub. No gallop.  Pulmonary:     Effort: Pulmonary effort is normal.  Abdominal:     General: Bowel sounds are normal. There is no distension.     Palpations: Abdomen is soft. There is no mass.     Tenderness: There is no abdominal tenderness.     Hernia: No hernia is present.  Musculoskeletal:     Right shoulder: Normal.     Left shoulder: Bony tenderness present. Decreased range of motion.     Cervical back: Normal range of motion and neck supple.     Right hip: Normal.     Left hip: Normal.     Right knee: Normal.     Left knee: Normal.     Comments: Patient has significant pain in groin with external rotation and internal rotation of the hip joint  Lymphadenopathy:     Head:     Right side of head: No submandibular, preauricular or posterior auricular adenopathy.     Left side of head: No submandibular, preauricular or posterior auricular adenopathy.     Cervical: No cervical adenopathy.     Right cervical: No superficial or deep cervical adenopathy.    Left cervical: No superficial or deep cervical adenopathy.  Skin:    General: Skin is warm and dry.     Coloration: Skin is not pale.     Findings: No abrasion, bruising, ecchymosis, erythema, lesion or rash.     Nails: There is no clubbing.  Neurological:     General: No focal deficit present.     Mental Status: She is alert and oriented to person, place, and time.  Sensory: No sensory deficit.  Psychiatric:        Attention and Perception: She is attentive.        Mood and Affect: Mood normal.        Speech: Speech normal.        Behavior: Behavior normal. Behavior is cooperative.        Thought Content: Thought content normal.        Judgment: Judgment normal.    She has absolutely no erythema of the abdominal wall Lab Results Lab Results  Component Value Date   WBC 8.7 09/20/2020    HGB 10.6 (L) 09/20/2020   HCT 35.3 (L) 09/20/2020   MCV 79.1 (L) 09/20/2020   PLT 147 (L) 09/20/2020    Lab Results  Component Value Date   CREATININE 1.06 (H) 09/20/2020   BUN 20 09/20/2020   NA 138 09/20/2020   K 4.2 09/20/2020   CL 105 09/20/2020   CO2 25 09/20/2020    Lab Results  Component Value Date   ALT 15 09/19/2020   AST 22 09/19/2020   ALKPHOS 80 09/19/2020   BILITOT 0.7 09/19/2020     Microbiology: Recent Results (from the past 240 hour(s))  Resp Panel by RT-PCR (Flu A&B, Covid) Urine, Clean Catch     Status: None   Collection Time: 09/19/20  2:38 AM   Specimen: Urine, Clean Catch; Nasopharyngeal(NP) swabs in vial transport medium  Result Value Ref Range Status   SARS Coronavirus 2 by RT PCR NEGATIVE NEGATIVE Final    Comment: (NOTE) SARS-CoV-2 target nucleic acids are NOT DETECTED.  The SARS-CoV-2 RNA is generally detectable in upper respiratory specimens during the acute phase of infection. The lowest concentration of SARS-CoV-2 viral copies this assay can detect is 138 copies/mL. A negative result does not preclude SARS-Cov-2 infection and should not be used as the sole basis for treatment or other patient management decisions. A negative result may occur with  improper specimen collection/handling, submission of specimen other than nasopharyngeal swab, presence of viral mutation(s) within the areas targeted by this assay, and inadequate number of viral copies(<138 copies/mL). A negative result must be combined with clinical observations, patient history, and epidemiological information. The expected result is Negative.  Fact Sheet for Patients:  EntrepreneurPulse.com.au  Fact Sheet for Healthcare Providers:  IncredibleEmployment.be  This test is no t yet approved or cleared by the [place] FDA and  has been authorized for detection and/or diagnosis of SARS-CoV-2 by FDA under an Emergency Use Authorization  (EUA). This EUA will remain  in effect (meaning this test can be used) for the duration of the COVID-19 declaration under Section 564(b)(1) of the Act, 21 U.S.C.section 360bbb-3(b)(1), unless the authorization is terminated  or revoked sooner.       Influenza A by PCR NEGATIVE NEGATIVE Final   Influenza B by PCR NEGATIVE NEGATIVE Final    Comment: (NOTE) The Xpert Xpress SARS-CoV-2/FLU/RSV plus assay is intended as an aid in the diagnosis of influenza from Nasopharyngeal swab specimens and should not be used as a sole basis for treatment. Nasal washings and aspirates are unacceptable for Xpert Xpress SARS-CoV-2/FLU/RSV testing.  Fact Sheet for Patients: EntrepreneurPulse.com.au  Fact Sheet for Healthcare Providers: IncredibleEmployment.be  This test is not yet approved or cleared by the [place] FDA and has been authorized for detection and/or diagnosis of SARS-CoV-2 by FDA under an Emergency Use Authorization (EUA). This EUA will remain in effect (meaning this test can be used) for the duration of  the COVID-19 declaration under Section 564(b)(1) of the Act, 21 U.S.C. section 360bbb-3(b)(1), unless the authorization is terminated or revoked.  Performed at [facility], [address] [place],  [place] [address]   Blood culture (routine x 2)     Status: None (Preliminary result)   Collection Time: 09/19/20  2:52 AM   Specimen: BLOOD  Result Value Ref Range Status   Specimen Description   Final    BLOOD Blood Culture adequate volume Performed at [facility], [address], [place], [place] [address]    Special Requests   Final    RIGHT ANTECUBITAL Performed at [facility], [address], [place], [place] [address]    Culture   Final    NO GROWTH < 24 HOURS Performed at [facility], [place] [address] [place], [place] [address]    Report Status PENDING  Incomplete   Blood culture (routine x 2)     Status: None (Preliminary result)   Collection Time: 09/19/20  3:07 AM   Specimen: BLOOD  Result Value Ref Range Status   Specimen Description   Final    BLOOD Blood Culture adequate volume Performed at [facility], [address], [place], [place] [address]    Special Requests   Final    BLOOD LEFT FOREARM Performed at [facility], [address], [place], [place] [address]    Culture   Final    NO GROWTH < 24 HOURS Performed at [facility], [place] [address] [place], [place] [address]    Report Status PENDING  Incomplete    Alcide Evener, [place] [facility] [phone] pager  09/20/2020, 4:41 PM

## 2020-09-20 NOTE — Assessment & Plan Note (Addendum)
-  continue synthroid 

## 2020-09-20 NOTE — Assessment & Plan Note (Signed)
-   chronic and wears compression stockings

## 2020-09-20 NOTE — Assessment & Plan Note (Signed)
-   Managed by outpatient pain clinic.  On buprenorphine patch

## 2020-09-20 NOTE — Hospital Course (Addendum)
Cheyenne Guzman is a 55 yo female with PMH OSA, prior LLE cellulitis, hx breast CA, DMII, morbid obesity, chronic low back pain, chronic severe lymphedema who presented to [facility] ER with left groin pain.  She underwent workup with CT A/P which showed skin thickening and subcutaneous edema in the lower abdominal wall as well as diffuse body wall edema.  She was started on antibiotics for presumed cellulitis however had no skin findings consistent with cellulitis. There was also noted 9 mm left lower renal pole stone on recent CT from 09/16/2020.  There was no associated hydronephrosis or hydroureter.  See below for further A&P

## 2020-09-20 NOTE — Assessment & Plan Note (Signed)
-   9 mm nonobstructing left stone noted on CT; micro hematuria noted on UA - discussed with urology; not contributing to patient's symptoms as noted above

## 2020-09-20 NOTE — Assessment & Plan Note (Signed)
-   continue SSI and CBG monitoring  

## 2020-09-20 NOTE — Progress Notes (Signed)
Progress Note    Cheyenne Guzman   [id number]  DOB: [birth year]  DOA: 09/19/2020     1  PCP: Laurey Morale, MD  Initial CC: left groin pain  Hospital Course: Ms. Swadley is a 55 yo female with PMH OSA, prior LLE cellulitis, hx breast CA, DMII, morbid obesity, chronic low back pain, chronic severe lymphedema who presented to [facility] ER with left groin pain.  She underwent workup with CT A/P which showed skin thickening and subcutaneous edema in the lower abdominal wall as well as diffuse body wall edema.  She was started on antibiotics for presumed cellulitis however had no skin findings consistent with cellulitis. There was also noted 9 mm left lower renal pole stone on recent CT from 09/16/2020.  There was no associated hydronephrosis or hydroureter.  Interval History:  No events overnight.  Patient seen with family in the room this morning.  Reviewed events leading up to admission and current symptoms.  Left groin pain is the biggest complaint and is a new pain symptom for the patient. She denied any prior history of kidney stones nor gross hematuria.  ROS: Constitutional: negative for chills and fevers, Respiratory: negative, Cardiovascular: negative for chest pain, and Gastrointestinal: negative for abdominal pain  Assessment & Plan: * Groin pain, left - Initially was suspected to have cellulitis given subcutaneous edema noted on CT abdomen/pelvis however there are no skin findings such as erythema, tenderness, calor.  Low suspicion for abdominal wall cellulitis after exam.  ID also consulted, will follow up recommendations - Was started on vancomycin and Zosyn on admission.  De-escalating as able -Also discussed 9 mm nephrolithiasis noted on CT abdomen/pelvis with urology.  This does not appear to be contributing to her symptoms after discussion and review of imaging.  Microscopic hematuria is expected in this setting which was present on her UA on admission as well -MRI left hip has  been ordered, follow-up results  Left nephrolithiasis - 9 mm nonobstructing left stone noted on CT; micro hematuria noted on UA - discussed with urology; not contributing to patient's symptoms as noted above   Fatty liver - continue lipitor   History of pulmonary embolism - On chronic Xarelto  Type 2 diabetes mellitus with hyperglycemia, with long-term current use of insulin ([place]) - continue SSI and CBG monitoring  Morbid obesity with BMI of 70 and over, adult [facility]) - limited mobility; last cardiology visit on 08/09/20 was virtual for patient comfort - Complicates overall prognosis and care - Body mass index is 70.48 kg/m.  Chronic pain - Managed by outpatient pain clinic.  On buprenorphine patch  Hypothyroidism - continue TSH   Lymphedema of both lower extremities - chronic and wears compression stockings   Obstructive sleep apnea - continue nightly CPAP    Old records reviewed in assessment of this patient  Antimicrobials: Vanc 8/29 >> 8/30 Zosyn 8/29 >> 8/30  DVT prophylaxis: SCDs Start: 09/19/20 1808 rivaroxaban (XARELTO) tablet 20 mg   Code Status:   Code Status: Full Code Family Communication: family in room  Disposition Plan: Status is: Inpatient  Remains inpatient appropriate because:Ongoing diagnostic testing needed not appropriate for outpatient work up, IV treatments appropriate due to intensity of illness or inability to take PO, and Inpatient level of care appropriate due to severity of illness  Dispo: The patient is from: Home              Anticipated d/c is to: Home  Patient currently is not medically stable to d/c.   Difficult to place patient No  Risk of unplanned readmission score: Unplanned Admission- Pilot do not use: 17.93   Objective: Blood pressure (!) 134/56, pulse 88, temperature 98.8 F (37.1 C), temperature source Oral, resp. rate 20, height '5\' 7"'$  (1.702 m), weight (!) 204.1 kg, SpO2 90 %.  Examination: General  appearance: alert, cooperative, no distress, and morbidly obese Head: Normocephalic, without obvious abnormality, atraumatic Eyes:  EOMI Lungs: clear to auscultation bilaterally Heart: regular rate and rhythm and S1, S2 normal Abdomen:  obese; soft; non tender throughout; BS distant but present Extremities:  LE lymphedema noted with compression stockings in place Skin: mobility and turgor normal Neurologic: Grossly normal  Consultants:  ID  Procedures:    Data Reviewed: I have personally reviewed following labs and imaging studies Results for orders placed or performed during the hospital encounter of 09/19/20 (from the past 24 hour(s))  Glucose, capillary     Status: Abnormal   Collection Time: 09/19/20  8:44 PM  Result Value Ref Range   Glucose-Capillary 204 (H) 70 - 99 mg/dL  HIV Antibody (routine testing w rflx)     Status: None   Collection Time: 09/20/20  7:24 AM  Result Value Ref Range   HIV Screen 4th Generation wRfx Non Reactive Non Reactive  Basic metabolic panel     Status: Abnormal   Collection Time: 09/20/20  7:24 AM  Result Value Ref Range   Sodium 138 135 - 145 mmol/L   Potassium 4.2 3.5 - 5.1 mmol/L   Chloride 105 98 - 111 mmol/L   CO2 25 22 - 32 mmol/L   Glucose, Bld 207 (H) 70 - 99 mg/dL   BUN 20 6 - 20 mg/dL   Creatinine, Ser 1.06 (H) 0.44 - 1.00 mg/dL   Calcium 8.7 (L) 8.9 - 10.3 mg/dL   GFR, Estimated >60 >60 mL/min   Anion gap 8 5 - 15  CBC     Status: Abnormal   Collection Time: 09/20/20  7:24 AM  Result Value Ref Range   WBC 8.7 4.0 - 10.5 K/uL   RBC 4.46 3.87 - 5.11 MIL/uL   Hemoglobin 10.6 (L) 12.0 - 15.0 g/dL   HCT 35.3 (L) 36.0 - 46.0 %   MCV 79.1 (L) 80.0 - 100.0 fL   MCH 23.8 (L) 26.0 - 34.0 pg   MCHC 30.0 30.0 - 36.0 g/dL   RDW 18.3 (H) 11.5 - 15.5 %   Platelets 147 (L) 150 - 400 K/uL   nRBC 0.0 0.0 - 0.2 %  Glucose, capillary     Status: Abnormal   Collection Time: 09/20/20  7:36 AM  Result Value Ref Range   Glucose-Capillary 201  (H) 70 - 99 mg/dL   Comment 1 Notify RN    Comment 2 Document in Chart   Glucose, capillary     Status: Abnormal   Collection Time: 09/20/20 12:01 PM  Result Value Ref Range   Glucose-Capillary 277 (H) 70 - 99 mg/dL   Comment 1 Notify RN    Comment 2 Document in Chart     Recent Results (from the past 240 hour(s))  Resp Panel by RT-PCR (Flu A&B, Covid) Urine, Clean Catch     Status: None   Collection Time: 09/19/20  2:38 AM   Specimen: Urine, Clean Catch; Nasopharyngeal(NP) swabs in vial transport medium  Result Value Ref Range Status   SARS Coronavirus 2 by RT PCR NEGATIVE NEGATIVE Final  Comment: (NOTE) SARS-CoV-2 target nucleic acids are NOT DETECTED.  The SARS-CoV-2 RNA is generally detectable in upper respiratory specimens during the acute phase of infection. The lowest concentration of SARS-CoV-2 viral copies this assay can detect is 138 copies/mL. A negative result does not preclude SARS-Cov-2 infection and should not be used as the sole basis for treatment or other patient management decisions. A negative result may occur with  improper specimen collection/handling, submission of specimen other than nasopharyngeal swab, presence of viral mutation(s) within the areas targeted by this assay, and inadequate number of viral copies(<138 copies/mL). A negative result must be combined with clinical observations, patient history, and epidemiological information. The expected result is Negative.  Fact Sheet for Patients:  EntrepreneurPulse.com.au  Fact Sheet for Healthcare Providers:  IncredibleEmployment.be  This test is no t yet approved or cleared by the [place] FDA and  has been authorized for detection and/or diagnosis of SARS-CoV-2 by FDA under an Emergency Use Authorization (EUA). This EUA will remain  in effect (meaning this test can be used) for the duration of the COVID-19 declaration under Section 564(b)(1) of the Act,  21 U.S.C.section 360bbb-3(b)(1), unless the authorization is terminated  or revoked sooner.       Influenza A by PCR NEGATIVE NEGATIVE Final   Influenza B by PCR NEGATIVE NEGATIVE Final    Comment: (NOTE) The Xpert Xpress SARS-CoV-2/FLU/RSV plus assay is intended as an aid in the diagnosis of influenza from Nasopharyngeal swab specimens and should not be used as a sole basis for treatment. Nasal washings and aspirates are unacceptable for Xpert Xpress SARS-CoV-2/FLU/RSV testing.  Fact Sheet for Patients: EntrepreneurPulse.com.au  Fact Sheet for Healthcare Providers: IncredibleEmployment.be  This test is not yet approved or cleared by the [place] FDA and has been authorized for detection and/or diagnosis of SARS-CoV-2 by FDA under an Emergency Use Authorization (EUA). This EUA will remain in effect (meaning this test can be used) for the duration of the COVID-19 declaration under Section 564(b)(1) of the Act, 21 U.S.C. section 360bbb-3(b)(1), unless the authorization is terminated or revoked.  Performed at [facility], [address], [place], [place] [address]   Blood culture (routine x 2)     Status: None (Preliminary result)   Collection Time: 09/19/20  2:52 AM   Specimen: BLOOD  Result Value Ref Range Status   Specimen Description   Final    BLOOD Blood Culture adequate volume Performed at [facility], [address], [place], [place] [address]    Special Requests   Final    RIGHT ANTECUBITAL Performed at [facility], [address] [place], [place] [address]    Culture   Final    NO GROWTH < 24 HOURS Performed at [facility], Between [address], [place], [place] [address]    Report Status PENDING  Incomplete  Blood culture (routine x 2)     Status: None (Preliminary result)   Collection Time: 09/19/20  3:07 AM   Specimen: BLOOD  Result Value Ref Range  Status   Specimen Description   Final    BLOOD Blood Culture adequate volume Performed at [facility], [address], [place], [place] [address]    Special Requests   Final    BLOOD LEFT FOREARM Performed at [facility], [address] [place], [place] [address]    Culture   Final    NO GROWTH < 24 HOURS Performed at [facility], [place]  [address], [place], [place] [address]    Report Status PENDING  Incomplete     Radiology Studies: CT ABDOMEN PELVIS W CONTRAST  Result Date: 09/19/2020 CLINICAL DATA:  Abdominal pain and fever. Left groin pain with "cellulitis". EXAM: CT ABDOMEN AND PELVIS WITH CONTRAST TECHNIQUE: Multidetector CT imaging of the abdomen and pelvis was performed using the standard protocol following bolus administration of intravenous contrast. CONTRAST:  178m OMNIPAQUE IOHEXOL 350 MG/ML SOLN COMPARISON:  09/16/2020 FINDINGS: Lower chest: Unremarkable. Hepatobiliary: The liver shows diffusely decreased attenuation suggesting fat deposition. Liver measures 20.8 cm craniocaudal length, enlarged. No suspicious focal abnormality within the liver parenchyma. There is no evidence for gallstones, gallbladder wall thickening, or pericholecystic fluid. No intrahepatic or extrahepatic biliary dilation. Pancreas: No focal mass lesion. No dilatation of the main duct. No intraparenchymal cyst. No peripancreatic edema. Spleen: 17 cm craniocaudal length.  No focal mass lesion. Adrenals/Urinary Tract: No adrenal nodule or mass. Right kidney unremarkable. 8 mm nonobstructing stone noted lower pole left kidney. No evidence for hydroureter. The urinary bladder appears normal for the degree of distention. Stomach/Bowel: Stomach is unremarkable. No gastric wall thickening. No evidence of outlet obstruction. Duodenum is normally positioned as is the ligament of Treitz. No small bowel wall thickening. No small bowel dilatation. The terminal ileum is  normal. The appendix is normal. No gross colonic mass. No colonic wall thickening. Vascular/Lymphatic: No abdominal aortic aneurysm. There is no gastrohepatic or hepatoduodenal ligament lymphadenopathy. No retroperitoneal or mesenteric lymphadenopathy. 13 mm short axis left external iliac node on 83/3 is minimally more prominent than on the prior study with upper normal increased number of lymph nodes in the groin regions, left-greater-than-right, similar to prior. Reproductive: Uterus surgically absent.  There is no adnexal mass. Other: No intraperitoneal free fluid. Musculoskeletal: Skin thickening and subcutaneous edema in the lower anterior abdominal wall similar to prior with diffuse body wall edema noted lower abdomen and pelvis. As before, cellulitis not excluded. No gas within the subcutaneous soft tissues of the abdomen/pelvis. Of note, the entire perineum is not been included on today's study. IMPRESSION: 1. 2. Skin thickening and subcutaneous edema in the lower anterior abdominal wall with diffuse body wall edema noted lower abdomen and pelvis. As before, cellulitis not excluded. No gas within the subcutaneous soft tissues of the abdomen/pelvis. Of note, the entire perineum is not included on today's study. 3. Hepatomegaly with hepatic steatosis. 4. Splenomegaly. 5. Nonobstructing left renal stone. Electronically Signed   By: EMisty StanleyM.D.   On: 09/19/2020 05:21   DG Chest Portable 1 View  Result Date: 09/19/2020 CLINICAL DATA:  Fever EXAM: PORTABLE CHEST 1 VIEW COMPARISON:  04/09/2019 FINDINGS: Lungs are clear.  No pleural effusion or pneumothorax. The heart is normal in size. IMPRESSION: No evidence of acute cardiopulmonary disease. Electronically Signed   By: SJulian HyM.D.   On: 09/19/2020 02:50   CT ABDOMEN PELVIS W CONTRAST  Final Result    DG Chest Portable 1 View  Final Result    MR HIP LEFT WO CONTRAST    (Results Pending)    Scheduled Meds:  atorvastatin  10 mg Oral  Daily   buprenorphine  1 patch Transdermal Weekly   cholecalciferol  2,000 Units Oral q morning   gabapentin  400 mg Oral BID   insulin aspart  0-15 Units Subcutaneous TID WC   insulin detemir  50 Units Subcutaneous BID   levothyroxine  75 mcg Oral QAC breakfast   methocarbamol  750 mg Oral q morning  multivitamin with minerals  1 tablet Oral Daily   pramipexole  0.5 mg Oral QHS   rivaroxaban  20 mg Oral Q supper   sertraline  200 mg Oral q morning   PRN Meds: acetaminophen **OR** acetaminophen, albuterol, bisacodyl, metoprolol tartrate, morphine injection, ondansetron **OR** ondansetron (ZOFRAN) IV, oxyCODONE, senna-docusate Continuous Infusions:   LOS: 1 day  Time spent: Greater than 50% of the 35 minute visit was spent in counseling/coordination of care for the patient as laid out in the A&P.   Dwyane Dee, MD [facility] 09/20/2020, 3:29 PM

## 2020-09-20 NOTE — Assessment & Plan Note (Signed)
-   continue nightly CPAP °

## 2020-09-20 NOTE — Assessment & Plan Note (Addendum)
-   Initially was suspected to have abdominal cellulitis given subcutaneous edema noted on CT abdomen/pelvis however there are no skin findings such as erythema, tenderness, calor.  Low suspicion for abdominal wall cellulitis after exam.  ID agreed and abx were stopped - MRI left hip also obtained on 8/30 to further evaluate: this showed possibly myositis (though CK normal x 2) vs extensive edema - starting patient on IV lasix to help with some of this - re-eval by ID, she has been started on Zyvox for empiric treatment if myositis, but also for new cellulitis on LLE that was not present on our exams on 8/30 (now present, 8/31)--- see cellulitis as separate problem -Overall the groin pain slowly improved.  She was continued on her home dose of oral Lasix at discharge  -Also discussed 9 mm nephrolithiasis noted on CT abdomen/pelvis with urology.  This does not appear to be contributing to her symptoms after discussion and review of imaging.  Microscopic hematuria is expected in this setting which was present on her UA on admission as well

## 2020-09-20 NOTE — Assessment & Plan Note (Signed)
-   continue lipitor

## 2020-09-20 NOTE — Assessment & Plan Note (Signed)
-   On chronic Xarelto

## 2020-09-21 ENCOUNTER — Other Ambulatory Visit (HOSPITAL_COMMUNITY): Payer: Self-pay

## 2020-09-21 DIAGNOSIS — Z6841 Body Mass Index (BMI) 40.0 and over, adult: Secondary | ICD-10-CM

## 2020-09-21 DIAGNOSIS — G894 Chronic pain syndrome: Secondary | ICD-10-CM | POA: Diagnosis not present

## 2020-09-21 DIAGNOSIS — N200 Calculus of kidney: Secondary | ICD-10-CM | POA: Diagnosis not present

## 2020-09-21 DIAGNOSIS — R1032 Left lower quadrant pain: Secondary | ICD-10-CM | POA: Diagnosis not present

## 2020-09-21 DIAGNOSIS — F330 Major depressive disorder, recurrent, mild: Secondary | ICD-10-CM

## 2020-09-21 DIAGNOSIS — L03116 Cellulitis of left lower limb: Secondary | ICD-10-CM

## 2020-09-21 DIAGNOSIS — I890 Lymphedema, not elsewhere classified: Secondary | ICD-10-CM | POA: Diagnosis not present

## 2020-09-21 LAB — CBC WITH DIFFERENTIAL/PLATELET
Abs Immature Granulocytes: 0.09 10*3/uL — ABNORMAL HIGH (ref 0.00–0.07)
Basophils Absolute: 0 10*3/uL (ref 0.0–0.1)
Basophils Relative: 1 %
Eosinophils Absolute: 0.2 10*3/uL (ref 0.0–0.5)
Eosinophils Relative: 2 %
HCT: 34.4 % — ABNORMAL LOW (ref 36.0–46.0)
Hemoglobin: 10.3 g/dL — ABNORMAL LOW (ref 12.0–15.0)
Immature Granulocytes: 1 %
Lymphocytes Relative: 16 %
Lymphs Abs: 1.3 10*3/uL (ref 0.7–4.0)
MCH: 23.7 pg — ABNORMAL LOW (ref 26.0–34.0)
MCHC: 29.9 g/dL — ABNORMAL LOW (ref 30.0–36.0)
MCV: 79.1 fL — ABNORMAL LOW (ref 80.0–100.0)
Monocytes Absolute: 0.6 10*3/uL (ref 0.1–1.0)
Monocytes Relative: 7 %
Neutro Abs: 6.2 10*3/uL (ref 1.7–7.7)
Neutrophils Relative %: 73 %
Platelets: 153 10*3/uL (ref 150–400)
RBC: 4.35 MIL/uL (ref 3.87–5.11)
RDW: 18.5 % — ABNORMAL HIGH (ref 11.5–15.5)
WBC: 8.4 10*3/uL (ref 4.0–10.5)
nRBC: 0.4 % — ABNORMAL HIGH (ref 0.0–0.2)

## 2020-09-21 LAB — BASIC METABOLIC PANEL
Anion gap: 8 (ref 5–15)
BUN: 16 mg/dL (ref 6–20)
CO2: 27 mmol/L (ref 22–32)
Calcium: 8.9 mg/dL (ref 8.9–10.3)
Chloride: 105 mmol/L (ref 98–111)
Creatinine, Ser: 0.99 mg/dL (ref 0.44–1.00)
GFR, Estimated: >60 mL/min (ref >60)
Glucose, Bld: 218 mg/dL — ABNORMAL HIGH (ref 70–99)
Potassium: 4.2 mmol/L (ref 3.5–5.1)
Sodium: 140 mmol/L (ref 135–145)

## 2020-09-21 LAB — URINALYSIS, ROUTINE W REFLEX MICROSCOPIC
Bilirubin Urine: NEGATIVE
Glucose, UA: NEGATIVE mg/dL
Ketones, ur: NEGATIVE mg/dL
Leukocytes,Ua: NEGATIVE
Nitrite: NEGATIVE
Protein, ur: NEGATIVE mg/dL
Specific Gravity, Urine: 1.015 (ref 1.005–1.030)
pH: 6 (ref 5.0–8.0)

## 2020-09-21 LAB — CK: Total CK: 80 U/L (ref 38–234)

## 2020-09-21 LAB — GLUCOSE, CAPILLARY
Glucose-Capillary: 201 mg/dL — ABNORMAL HIGH (ref 70–99)
Glucose-Capillary: 291 mg/dL — ABNORMAL HIGH (ref 70–99)
Glucose-Capillary: 273 mg/dL — ABNORMAL HIGH (ref 70–99)
Glucose-Capillary: 296 mg/dL — ABNORMAL HIGH (ref 70–99)

## 2020-09-21 LAB — HEMOGLOBIN A1C
Hgb A1c MFr Bld: 10.6 % — ABNORMAL HIGH (ref 4.8–5.6)
Mean Plasma Glucose: 258 mg/dL

## 2020-09-21 LAB — URINALYSIS, MICROSCOPIC (REFLEX)
Bacteria, UA: NONE SEEN
WBC, UA: NONE SEEN WBC/hpf (ref 0–5)

## 2020-09-21 LAB — MAGNESIUM: Magnesium: 2 mg/dL (ref 1.7–2.4)

## 2020-09-21 MED ORDER — SERTRALINE HCL 100 MG PO TABS
100.0000 mg | ORAL_TABLET | Freq: Every morning | ORAL | Status: DC
  Administered 2020-09-22 – 2020-09-23 (×2): 100 mg via ORAL
  Filled 2020-09-21 (×2): qty 1

## 2020-09-21 MED ORDER — INSULIN ASPART 100 UNIT/ML IJ SOLN
0.0000 [IU] | Freq: Every day | INTRAMUSCULAR | Status: DC
  Administered 2020-09-21 – 2020-09-22 (×2): 3 [IU] via SUBCUTANEOUS

## 2020-09-21 MED ORDER — INSULIN ASPART 100 UNIT/ML IJ SOLN
0.0000 [IU] | Freq: Three times a day (TID) | INTRAMUSCULAR | Status: DC
  Administered 2020-09-21: 11 [IU] via SUBCUTANEOUS
  Administered 2020-09-22: 7 [IU] via SUBCUTANEOUS
  Administered 2020-09-22: 11 [IU] via SUBCUTANEOUS
  Administered 2020-09-22: 7 [IU] via SUBCUTANEOUS
  Administered 2020-09-23: 11 [IU] via SUBCUTANEOUS

## 2020-09-21 MED ORDER — FUROSEMIDE 10 MG/ML IJ SOLN
40.0000 mg | Freq: Every day | INTRAMUSCULAR | Status: DC
  Administered 2020-09-21 – 2020-09-23 (×3): 40 mg via INTRAVENOUS
  Filled 2020-09-21 (×3): qty 4

## 2020-09-21 MED ORDER — INSULIN ASPART 100 UNIT/ML IJ SOLN
10.0000 [IU] | Freq: Three times a day (TID) | INTRAMUSCULAR | Status: DC
  Administered 2020-09-22 – 2020-09-23 (×4): 10 [IU] via SUBCUTANEOUS

## 2020-09-21 MED ORDER — LINEZOLID 600 MG PO TABS
600.0000 mg | ORAL_TABLET | Freq: Two times a day (BID) | ORAL | Status: DC
  Administered 2020-09-21 – 2020-09-23 (×5): 600 mg via ORAL
  Filled 2020-09-21 (×5): qty 1

## 2020-09-21 MED ORDER — INSULIN DETEMIR 100 UNIT/ML ~~LOC~~ SOLN
55.0000 [IU] | Freq: Two times a day (BID) | SUBCUTANEOUS | Status: DC
  Administered 2020-09-21 – 2020-09-23 (×4): 55 [IU] via SUBCUTANEOUS
  Filled 2020-09-21 (×4): qty 0.55

## 2020-09-21 MED ORDER — POLYETHYLENE GLYCOL 3350 17 G PO PACK
17.0000 g | PACK | Freq: Every day | ORAL | Status: DC
  Administered 2020-09-21 – 2020-09-23 (×3): 17 g via ORAL
  Filled 2020-09-21 (×3): qty 1

## 2020-09-21 NOTE — TOC Benefit Eligibility Note (Signed)
Patient Teacher, English as a foreign language completed.    The patient is currently admitted and upon discharge could be taking Linezolid 600 mg.  The current 30 day co-pay is, $12.85.   The patient is insured through [employer] [place] Patient Advocate Specialist [place] [employer] Direct Number: [phone]  Fax: [phone]

## 2020-09-21 NOTE — Assessment & Plan Note (Addendum)
-   as of 8/31, her LLE is now TTP with associated calor and dolor. Exam consistent with cellulitis; not significantly erythematous yet but skin is also chronically hyperpigmented  - continue zyvox as per ID; prescription provided at discharge - continue following clinically; erythema and tenderness improved prior to discharge

## 2020-09-21 NOTE — Progress Notes (Signed)
Progress Note    Cheyenne Guzman   [id number]  DOB: March 16, 1965  DOA: 09/19/2020     2  PCP: Laurey Morale, MD  Initial CC: left groin pain  Hospital Course: Ms. Wisnewski is a 55 yo female with PMH OSA, prior LLE cellulitis, hx breast CA, DMII, morbid obesity, chronic low back pain, chronic severe lymphedema who presented to [facility] ER with left groin pain.  She underwent workup with CT A/P which showed skin thickening and subcutaneous edema in the lower abdominal wall as well as diffuse body wall edema.  She was started on antibiotics for presumed cellulitis however had no skin findings consistent with cellulitis. There was also noted 9 mm left lower renal pole stone on recent CT from 09/16/2020.  There was no associated hydronephrosis or hydroureter.  Interval History:  Patient upset this morning about antibiotics being discontinued yesterday and now developing pain in her left leg.  Provided empathetic listening and reassured patient that we are treating appropriately as clinical conditions change.  Agreed with her that left leg now more consistent with cellulitis which is new since yesterday.  She had no signs of abdominal wall cellulitis yesterday therefore justifying discontinuation of antibiotics with further monitoring. Also discussed with her the reasoning for initiating IV Lasix due to the extensive edema appreciated on MRI hip.  She was worried about her renal function while on Lasix even though she is already on Lasix at home.  Again reassured her and stated we would monitor blood work as well while in the hospital.  She also endorsed some concern for constipation and requested a bowel regimen which was also ordered after our conversation.   ROS: Constitutional: negative for chills and fevers, Respiratory: negative, Cardiovascular: negative for chest pain, and Gastrointestinal: negative for abdominal pain  Assessment & Plan: * Groin pain, left - Initially was suspected to have  abdominal cellulitis given subcutaneous edema noted on CT abdomen/pelvis however there are no skin findings such as erythema, tenderness, calor.  Low suspicion for abdominal wall cellulitis after exam.  ID agreed and abx were stopped - MRI left hip also obtained on 8/30 to further evaluate: this showed possibly myositis (though CK normal x 2) vs extensive edema - starting patient on IV lasix to help with some of this - re-eval by ID, she has been started on Zyvox for empiric treatment if myositis, but also for new cellulitis on LLE that was not present on our exams on 8/30 (now present, 8/31)  -Also discussed 9 mm nephrolithiasis noted on CT abdomen/pelvis with urology.  This does not appear to be contributing to her symptoms after discussion and review of imaging.  Microscopic hematuria is expected in this setting which was present on her UA on admission as well  Recurrent cellulitis of lower extremity - as of 8/31, her LLE is now TTP with associated calor and dolor. Exam consistent with cellulitis; not significantly erythematous yet but skin is also chronically hyperpigmented  - continue zyvox as per ID - continue following clinically   Left nephrolithiasis - 9 mm nonobstructing left stone noted on CT; micro hematuria noted on UA - discussed with urology; not contributing to patient's symptoms as noted above   Fatty liver - continue lipitor   History of pulmonary embolism - On chronic Xarelto  Type 2 diabetes mellitus with hyperglycemia, with long-term current use of insulin (HCC) - continue SSI and CBG monitoring  Morbid obesity with BMI of 70 and over, adult ([place]) -  limited mobility; last cardiology visit on 08/09/20 was virtual for patient comfort - Complicates overall prognosis and care - Body mass index is 70.48 kg/m.  Chronic pain - Managed by outpatient pain clinic.  On buprenorphine patch  Hypothyroidism - continue TSH   Lymphedema of both lower extremities - chronic  and wears compression stockings   Obstructive sleep apnea - continue nightly CPAP   Old records reviewed in assessment of this patient  Antimicrobials: Vanc 8/29 >> 8/30 Zosyn 8/29 >> 8/30 Zyvox 09/21/2020 >> current  DVT prophylaxis: SCDs Start: 09/19/20 1808 rivaroxaban (XARELTO) tablet 20 mg   Code Status:   Code Status: Full Code Family Communication: family in room  Disposition Plan: Status is: Inpatient  Remains inpatient appropriate because:Ongoing diagnostic testing needed not appropriate for outpatient work up, IV treatments appropriate due to intensity of illness or inability to take PO, and Inpatient level of care appropriate due to severity of illness  Dispo: The patient is from: Home              Anticipated d/c is to: Home              Patient currently is not medically stable to d/c.   Difficult to place patient No  Risk of unplanned readmission score: Unplanned Admission- Pilot do not use: 15.19   Objective: Blood pressure 134/77, pulse 74, temperature 99 F (37.2 C), temperature source Oral, resp. rate 20, height '5\' 7"'$  (1.702 m), weight (!) 196.4 kg, SpO2 96 %.  Examination: General appearance: alert, cooperative, no distress, and morbidly obese Head: Normocephalic, without obvious abnormality, atraumatic Eyes:  EOMI Lungs: clear to auscultation bilaterally Heart: regular rate and rhythm and S1, S2 normal Abdomen:  obese; soft; non tender throughout; BS distant but present Extremities:  Left lower extremity now appreciated with calor, dolor.  No significant erythema appreciated but skin is hyperpigmented from chronic stasis.  See picture below as well Skin: mobility and turgor normal Neurologic: Grossly normal    Consultants:  ID  Procedures:    Data Reviewed: I have personally reviewed following labs and imaging studies Results for orders placed or performed during the hospital encounter of 09/19/20 (from the past 24 hour(s))  Glucose, capillary      Status: Abnormal   Collection Time: 09/20/20  6:04 PM  Result Value Ref Range   Glucose-Capillary 258 (H) 70 - 99 mg/dL   Comment 1 Notify RN    Comment 2 Document in Chart   CK     Status: None   Collection Time: 09/20/20  6:14 PM  Result Value Ref Range   Total CK 135 38 - 234 U/L  Glucose, capillary     Status: Abnormal   Collection Time: 09/20/20  8:21 PM  Result Value Ref Range   Glucose-Capillary 285 (H) 70 - 99 mg/dL  Basic metabolic panel     Status: Abnormal   Collection Time: 09/21/20  5:37 AM  Result Value Ref Range   Sodium 140 135 - 145 mmol/L   Potassium 4.2 3.5 - 5.1 mmol/L   Chloride 105 98 - 111 mmol/L   CO2 27 22 - 32 mmol/L   Glucose, Bld 218 (H) 70 - 99 mg/dL   BUN 16 6 - 20 mg/dL   Creatinine, Ser 0.99 0.44 - 1.00 mg/dL   Calcium 8.9 8.9 - 10.3 mg/dL   GFR, Estimated >60 >60 mL/min   Anion gap 8 5 - 15  CBC with Differential/Platelet     Status:  Abnormal   Collection Time: 09/21/20  5:37 AM  Result Value Ref Range   WBC 8.4 4.0 - 10.5 K/uL   RBC 4.35 3.87 - 5.11 MIL/uL   Hemoglobin 10.3 (L) 12.0 - 15.0 g/dL   HCT 34.4 (L) 36.0 - 46.0 %   MCV 79.1 (L) 80.0 - 100.0 fL   MCH 23.7 (L) 26.0 - 34.0 pg   MCHC 29.9 (L) 30.0 - 36.0 g/dL   RDW 18.5 (H) 11.5 - 15.5 %   Platelets 153 150 - 400 K/uL   nRBC 0.4 (H) 0.0 - 0.2 %   Neutrophils Relative % 73 %   Neutro Abs 6.2 1.7 - 7.7 K/uL   Lymphocytes Relative 16 %   Lymphs Abs 1.3 0.7 - 4.0 K/uL   Monocytes Relative 7 %   Monocytes Absolute 0.6 0.1 - 1.0 K/uL   Eosinophils Relative 2 %   Eosinophils Absolute 0.2 0.0 - 0.5 K/uL   Basophils Relative 1 %   Basophils Absolute 0.0 0.0 - 0.1 K/uL   Immature Granulocytes 1 %   Abs Immature Granulocytes 0.09 (H) 0.00 - 0.07 K/uL  Magnesium     Status: None   Collection Time: 09/21/20  5:37 AM  Result Value Ref Range   Magnesium 2.0 1.7 - 2.4 mg/dL  CK     Status: None   Collection Time: 09/21/20  5:37 AM  Result Value Ref Range   Total CK 80 38 - 234 U/L   Glucose, capillary     Status: Abnormal   Collection Time: 09/21/20  7:58 AM  Result Value Ref Range   Glucose-Capillary 201 (H) 70 - 99 mg/dL   Comment 1 Notify RN   Glucose, capillary     Status: Abnormal   Collection Time: 09/21/20 12:02 PM  Result Value Ref Range   Glucose-Capillary 291 (H) 70 - 99 mg/dL   Comment 1 Notify RN     Recent Results (from the past 240 hour(s))  Resp Panel by RT-PCR (Flu A&B, Covid) Urine, Clean Catch     Status: None   Collection Time: 09/19/20  2:38 AM   Specimen: Urine, Clean Catch; Nasopharyngeal(NP) swabs in vial transport medium  Result Value Ref Range Status   SARS Coronavirus 2 by RT PCR NEGATIVE NEGATIVE Final    Comment: (NOTE) SARS-CoV-2 target nucleic acids are NOT DETECTED.  The SARS-CoV-2 RNA is generally detectable in upper respiratory specimens during the acute phase of infection. The lowest concentration of SARS-CoV-2 viral copies this assay can detect is 138 copies/mL. A negative result does not preclude SARS-Cov-2 infection and should not be used as the sole basis for treatment or other patient management decisions. A negative result may occur with  improper specimen collection/handling, submission of specimen other than nasopharyngeal swab, presence of viral mutation(s) within the areas targeted by this assay, and inadequate number of viral copies(<138 copies/mL). A negative result must be combined with clinical observations, patient history, and epidemiological information. The expected result is Negative.  Fact Sheet for Patients:  EntrepreneurPulse.com.au  Fact Sheet for Healthcare Providers:  IncredibleEmployment.be  This test is no t yet approved or cleared by the [place] FDA and  has been authorized for detection and/or diagnosis of SARS-CoV-2 by FDA under an Emergency Use Authorization (EUA). This EUA will remain  in effect (meaning this test can be used) for the duration of  the COVID-19 declaration under Section 564(b)(1) of the Act, 21 U.S.C.section 360bbb-3(b)(1), unless the authorization is terminated  or  revoked sooner.       Influenza A by PCR NEGATIVE NEGATIVE Final   Influenza B by PCR NEGATIVE NEGATIVE Final    Comment: (NOTE) The Xpert Xpress SARS-CoV-2/FLU/RSV plus assay is intended as an aid in the diagnosis of influenza from Nasopharyngeal swab specimens and should not be used as a sole basis for treatment. Nasal washings and aspirates are unacceptable for Xpert Xpress SARS-CoV-2/FLU/RSV testing.  Fact Sheet for Patients: EntrepreneurPulse.com.au  Fact Sheet for Healthcare Providers: IncredibleEmployment.be  This test is not yet approved or cleared by the [place] FDA and has been authorized for detection and/or diagnosis of SARS-CoV-2 by FDA under an Emergency Use Authorization (EUA). This EUA will remain in effect (meaning this test can be used) for the duration of the COVID-19 declaration under Section 564(b)(1) of the Act, 21 U.S.C. section 360bbb-3(b)(1), unless the authorization is terminated or revoked.  Performed at [facility], [address] [place], [place] [address]   Blood culture (routine x 2)     Status: None (Preliminary result)   Collection Time: 09/19/20  2:52 AM   Specimen: BLOOD  Result Value Ref Range Status   Specimen Description   Final    BLOOD Blood Culture adequate volume Performed at [facility], [address], [place], [place] [address]    Special Requests   Final    RIGHT ANTECUBITAL Performed at [facility], [address] [place], [place] [address]    Culture   Final    NO GROWTH 2 DAYS Performed at [facility], [place] [address], [place], [place] [address]    Report Status PENDING  Incomplete  Blood culture (routine x 2)     Status: None (Preliminary result)   Collection Time:  09/19/20  3:07 AM   Specimen: BLOOD  Result Value Ref Range Status   Specimen Description   Final    BLOOD Blood Culture adequate volume Performed at [facility], [address], [place], [place] [address]    Special Requests   Final    BLOOD LEFT FOREARM Performed at [facility], [address] [place], [place] [address]    Culture   Final    NO GROWTH 2 DAYS Performed at [facility], [place] [address], [place], [place] [address]    Report Status PENDING  Incomplete     Radiology Studies: MR HIP LEFT WO CONTRAST  Result Date: 09/20/2020 CLINICAL DATA:  Left hip and groin pain EXAM: MR OF THE LEFT HIP WITHOUT CONTRAST TECHNIQUE: Multiplanar, multisequence MR imaging was performed. No intravenous contrast was administered. COMPARISON:  CT pelvis 09/19/2020 FINDINGS: Body habitus reduces diagnostic sensitivity and specificity. Despite efforts by the technologist and patient, motion artifact is present on today's exam and could not be eliminated. This reduces exam sensitivity and specificity. The patient was unable to complete today's examination due to discomfort and difficulty laying supine. Bones: Postoperative findings in the lower lumbar spine. Spurring of the acetabula and femoral heads. Articular cartilage and labrum Articular cartilage: Mild degenerative chondral thinning in both hips. Labrum: Not well assessed today due to technical factors including motion artifact. Joint or bursal effusion Joint effusion:  Absent Bursae: Trace bilateral trochanteric bursitis Muscles and tendons Muscles and tendons: Prominently accentuated T2 signal is present within and along the left adductor longus muscle for example on image 40 series 7 and image 24 series 6. On the T1 weighted images I do not see a substantial degree of  accentuated T1 signal in the corresponding region to necessarily indicate intramuscular hematoma. There is also some confluent edema  tracking along adjacent fascia planes and along the left adductor aponeurosis for example on images 27 through 28 of series 7 and on image 24 series 6. Other findings Miscellaneous: Subcutaneous edema in the anterior pelvic subcutaneous adipose tissues and into the pubis, panniculitis is not excluded. Left external iliac node 1.5 cm in short axis on image 15 series 7, stable from 09/19/2020. Left inguinal lymph node 1.4 cm in short axis on image 35 series 7, likewise stable. Other upper normal size and mildly enlarged inguinal and external iliac lymph nodes are probably reactive. Trace free pelvic fluid.  Uterus surgically absent. IMPRESSION: 1. Extensive edema in the left adductor longus muscle in the medial upper thigh could reflect myositis or muscle tear. Intramuscular hematoma is likewise a possibility although I do not demonstrate substantially increased T1 signal within the muscle to further suggest hematoma. Associated edema tracks up along the adductor longus aponeurosis towards the pubis. 2. Subcutaneous edema in the anterior pelvic wall subcutaneous tissues tracking down to the pubis, panniculitis is not excluded. 3. Likely reactive pelvic and inguinal lymph nodes bilaterally. 4. No hip effusion to suggest septic hip. Trace fluid in the trochanteric bursa bilaterally. 5. Trace free pelvic fluid. 6. A variety of technical factors adversely affect sensitivity of today's exam, including body habitus, motion artifact, and the patient terminating the exam prior to completion due to discomfort. Electronically Signed   By: Van Clines M.D.   On: 09/20/2020 15:37   MR HIP LEFT WO CONTRAST  Final Result    CT ABDOMEN PELVIS W CONTRAST  Final Result    DG Chest Portable 1 View  Final Result      Scheduled Meds:  atorvastatin  10 mg Oral Daily   buprenorphine  1 patch Transdermal Weekly   cholecalciferol  2,000 Units Oral q morning   furosemide  40 mg Intravenous Daily   gabapentin  400 mg  Oral BID   insulin aspart  0-20 Units Subcutaneous TID WC   insulin aspart  0-5 Units Subcutaneous QHS   insulin detemir  55 Units Subcutaneous BID   levothyroxine  75 mcg Oral QAC breakfast   linezolid  600 mg Oral Q12H   methocarbamol  750 mg Oral q morning   multivitamin with minerals  1 tablet Oral Daily   polyethylene glycol  17 g Oral Daily   pramipexole  0.5 mg Oral QHS   rivaroxaban  20 mg Oral Q supper   [START ON 09/22/2020] sertraline  100 mg Oral q morning   PRN Meds: acetaminophen **OR** acetaminophen, albuterol, bisacodyl, metoprolol tartrate, morphine injection, ondansetron **OR** ondansetron (ZOFRAN) IV, oxyCODONE, senna-docusate Continuous Infusions:   LOS: 2 days  Time spent: Greater than 50% of the 35 minute visit was spent in counseling/coordination of care for the patient as laid out in the A&P.   Dwyane Dee, MD [facility] 09/21/2020, 4:08 PM

## 2020-09-21 NOTE — Progress Notes (Addendum)
Inpatient Diabetes Program Recommendations  AACE/ADA: New Consensus Statement on Inpatient Glycemic Control   Target Ranges:  Prepandial:   less than 140 mg/dL      Peak postprandial:   less than 180 mg/dL (1-2 hours)      Critically ill patients:  140 - 180 mg/dL   Results for Cheyenne Guzman, Cheyenne Guzman (MRN [id number]) as of 09/21/2020 11:50  Ref. Range 09/20/2020 07:36 09/20/2020 12:01 09/20/2020 18:04 09/20/2020 20:21 09/21/2020 07:58  Glucose-Capillary Latest Ref Range: 70 - 99 mg/dL 201 (H) 277 (H) 258 (H) 285 (H) 201 (H)  Results for CARETTA, [name] (MRN [id number]) as of 09/21/2020 11:50  Ref. Range 02/19/2020 16:11 09/20/2020 07:24  Hemoglobin A1C Latest Ref Range: 4.8 - 5.6 % 9.2 (H) 10.6 (H)    Review of Glycemic Control  Diabetes history: DM2 Outpatient Diabetes medications: Humalog 75/25 120 units BID, Metformin 1000 mg QAM, Ozempic 0.5 mg Qweek Current orders for Inpatient glycemic control: Levemir 50 units BID, Novolog 0-15 units TID  Inpatient Diabetes Program Recommendations:    Insulin: Please consider increasing Levemir to 55 units BID, adding Novolog 0-5 units QHS for bedtime correction, and ordering Novolog 5 units TID with meals for meal coverage if patient eats at least 50% of meals.  HbgA1C:  A1C 10.6% on 09/20/20 indicating an average glucose of 258 mg/dl over the past 2-3 months.  NOTE: Patient admitted with left groin pain and left nephrolithiasis. Diabetes coordinator working remotely. Called patient to discuss DM control and patient's daughter had reported that nursing staff was in the room working with patient and asked that I call back. When called back, daughter reports that patient is asleep and she did not want to wake her up. Patient's daughter was able to verify that patient is taking Humalog 75/25 120 units BID and Metformin. She states that she does not think her mother takes Ozempic. Will ask diabetes coordinator on [facility] to go by and talk with patient this  afternoon.  Thanks, Barnie Alderman, RN, MSN, CDE Diabetes Coordinator Inpatient Diabetes Program [phone] (Team Pager from 8am to 5pm)

## 2020-09-21 NOTE — Progress Notes (Signed)
Pt has redness to L Lower Leg that pt states is increasing size.  Writer marked the outer border of the redness with marker.

## 2020-09-21 NOTE — Progress Notes (Signed)
Subjective:  Now with onset of lower extremity erythema and tenderness   Antibiotics:  Anti-infectives (From admission, onward)    Start     Dose/Rate Route Frequency Ordered Stop   09/21/20 1115  linezolid (ZYVOX) tablet 600 mg        600 mg Oral Every 12 hours 09/21/20 1020 10/05/20 0959   09/19/20 1030  vancomycin (VANCOCIN) IVPB 1000 mg/200 mL premix  Status:  Discontinued       See Hyperspace for full Linked Orders Report.   1,000 mg 200 mL/hr over 60 Minutes Intravenous  Once 09/19/20 0920 09/19/20 0924   09/19/20 0930  piperacillin-tazobactam (ZOSYN) IVPB 3.375 g  Status:  Discontinued        3.375 g 12.5 mL/hr over 240 Minutes Intravenous Every 8 hours 09/19/20 0919 09/20/20 0834   09/19/20 0930  vancomycin (VANCOCIN) IVPB 1000 mg/200 mL premix  Status:  Discontinued       See Hyperspace for full Linked Orders Report.   1,000 mg 200 mL/hr over 60 Minutes Intravenous  Once 09/19/20 0920 09/19/20 0924   09/19/20 0930  vancomycin (VANCOCIN) IVPB 1000 mg/200 mL premix  Status:  Discontinued        1,000 mg 200 mL/hr over 60 Minutes Intravenous Every 8 hours 09/19/20 0925 09/20/20 1301   09/19/20 0245  vancomycin (VANCOCIN) IVPB 1000 mg/200 mL premix        1,000 mg 200 mL/hr over 60 Minutes Intravenous  Once 09/19/20 0235 09/19/20 0406   09/19/20 0245  piperacillin-tazobactam (ZOSYN) IVPB 3.375 g        3.375 g 12.5 mL/hr over 240 Minutes Intravenous  Once 09/19/20 0235 09/19/20 0450       Medications: Scheduled Meds:  atorvastatin  10 mg Oral Daily   buprenorphine  1 patch Transdermal Weekly   cholecalciferol  2,000 Units Oral q morning   furosemide  40 mg Intravenous Daily   gabapentin  400 mg Oral BID   insulin aspart  0-15 Units Subcutaneous TID WC   insulin detemir  50 Units Subcutaneous BID   levothyroxine  75 mcg Oral QAC breakfast   linezolid  600 mg Oral Q12H   methocarbamol  750 mg Oral q morning   multivitamin with minerals  1 tablet Oral  Daily   polyethylene glycol  17 g Oral Daily   pramipexole  0.5 mg Oral QHS   rivaroxaban  20 mg Oral Q supper   [START ON 09/22/2020] sertraline  100 mg Oral q morning   Continuous Infusions: PRN Meds:.acetaminophen **OR** acetaminophen, albuterol, bisacodyl, metoprolol tartrate, morphine injection, ondansetron **OR** ondansetron (ZOFRAN) IV, oxyCODONE, senna-docusate    Objective: Weight change: -7.711 kg  Intake/Output Summary (Last 24 hours) at 09/21/2020 1510 Last data filed at 09/21/2020 [id number] Gross per 24 hour  Intake --  Output 1000 ml  Net -1000 ml   Blood pressure 134/77, pulse 74, temperature 99 F (37.2 C), temperature source Oral, resp. rate 20, height '5\' 7"'$  (1.702 m), weight (!) 196.4 kg, SpO2 96 %. Temp:  [97.8 F (36.6 C)-100 F (37.8 C)] 99 F (37.2 C) (08/31 0759) Pulse Rate:  [74-85] 74 (08/31 0519) Resp:  [17-20] 20 (08/31 0519) BP: (134-151)/(71-77) 134/77 (08/31 0519) SpO2:  [90 %-96 %] 96 % (08/31 0519) Weight:  [196.4 kg] 196.4 kg (08/31 0100)  Physical Exam: Physical Exam Constitutional:      General: She is not in acute distress.    Appearance: She is well-developed.  She is not diaphoretic.  HENT:     Head: Normocephalic and atraumatic.     Right Ear: External ear normal.     Left Ear: External ear normal.     Mouth/Throat:     Pharynx: No oropharyngeal exudate.  Eyes:     General: No scleral icterus.    Conjunctiva/sclera: Conjunctivae normal.     Pupils: Pupils are equal, round, and reactive to light.  Cardiovascular:     Rate and Rhythm: Normal rate and regular rhythm.     Heart sounds:    Friction rub present.  Pulmonary:     Effort: Pulmonary effort is normal. No respiratory distress.     Breath sounds: Normal breath sounds. No wheezing or rales.  Abdominal:     General: Bowel sounds are normal. There is no distension.     Palpations: Abdomen is soft.     Tenderness: There is no abdominal tenderness. There is no rebound.   Lymphadenopathy:     Cervical: No cervical adenopathy.  Skin:    General: Skin is warm and dry.     Coloration: Skin is not pale.     Findings: Erythema present.  Neurological:     General: No focal deficit present.     Mental Status: She is alert and oriented to person, place, and time.     Motor: No abnormal muscle tone.     Coordination: Coordination normal.  Psychiatric:        Mood and Affect: Mood normal.        Behavior: Behavior normal.        Thought Content: Thought content normal.        Judgment: Judgment normal.     Did not try to manipulate her left hip joint today.  Left lower extremity with erythema and tenderness consistent with cellulitis at the site  09/20/2020:     CBC:    BMET Recent Labs    09/20/20 0724 09/21/20 0537  NA 138 140  K 4.2 4.2  CL 105 105  CO2 25 27  GLUCOSE 207* 218*  BUN 20 16  CREATININE 1.06* 0.99  CALCIUM 8.7* 8.9     Liver Panel  Recent Labs    09/19/20 0238  PROT 7.3  ALBUMIN 4.1  AST 22  ALT 15  ALKPHOS 80  BILITOT 0.7       Sedimentation Rate No results for input(s): ESRSEDRATE in the last 72 hours. C-Reactive Protein No results for input(s): CRP in the last 72 hours.  Micro Results: Recent Results (from the past 720 hour(s))  Resp Panel by RT-PCR (Flu A&B, Covid) Urine, Clean Catch     Status: None   Collection Time: 09/19/20  2:38 AM   Specimen: Urine, Clean Catch; Nasopharyngeal(NP) swabs in vial transport medium  Result Value Ref Range Status   SARS Coronavirus 2 by RT PCR NEGATIVE NEGATIVE Final    Comment: (NOTE) SARS-CoV-2 target nucleic acids are NOT DETECTED.  The SARS-CoV-2 RNA is generally detectable in upper respiratory specimens during the acute phase of infection. The lowest concentration of SARS-CoV-2 viral copies this assay can detect is 138 copies/mL. A negative result does not preclude SARS-Cov-2 infection and should not be used as the sole basis for treatment or other  patient management decisions. A negative result may occur with  improper specimen collection/handling, submission of specimen other than nasopharyngeal swab, presence of viral mutation(s) within the areas targeted by this assay, and inadequate number of viral copies(<138 copies/mL).  A negative result must be combined with clinical observations, patient history, and epidemiological information. The expected result is Negative.  Fact Sheet for Patients:  EntrepreneurPulse.com.au  Fact Sheet for Healthcare Providers:  IncredibleEmployment.be  This test is no t yet approved or cleared by the [place] FDA and  has been authorized for detection and/or diagnosis of SARS-CoV-2 by FDA under an Emergency Use Authorization (EUA). This EUA will remain  in effect (meaning this test can be used) for the duration of the COVID-19 declaration under Section 564(b)(1) of the Act, 21 U.S.C.section 360bbb-3(b)(1), unless the authorization is terminated  or revoked sooner.       Influenza A by PCR NEGATIVE NEGATIVE Final   Influenza B by PCR NEGATIVE NEGATIVE Final    Comment: (NOTE) The Xpert Xpress SARS-CoV-2/FLU/RSV plus assay is intended as an aid in the diagnosis of influenza from Nasopharyngeal swab specimens and should not be used as a sole basis for treatment. Nasal washings and aspirates are unacceptable for Xpert Xpress SARS-CoV-2/FLU/RSV testing.  Fact Sheet for Patients: EntrepreneurPulse.com.au  Fact Sheet for Healthcare Providers: IncredibleEmployment.be  This test is not yet approved or cleared by the [place] FDA and has been authorized for detection and/or diagnosis of SARS-CoV-2 by FDA under an Emergency Use Authorization (EUA). This EUA will remain in effect (meaning this test can be used) for the duration of the COVID-19 declaration under Section 564(b)(1) of the Act, 21 U.S.C. section  360bbb-3(b)(1), unless the authorization is terminated or revoked.  Performed at [facility], [address], [place], [place] [address]   Blood culture (routine x 2)     Status: None (Preliminary result)   Collection Time: 09/19/20  2:52 AM   Specimen: BLOOD  Result Value Ref Range Status   Specimen Description   Final    BLOOD Blood Culture adequate volume Performed at [facility], [address], [place], [place] [address]    Special Requests   Final    RIGHT ANTECUBITAL Performed at [facility], [address] [place], [place] [address]    Culture   Final    NO GROWTH 2 DAYS Performed at [facility], [place] [address] [place], [place] [address]    Report Status PENDING  Incomplete  Blood culture (routine x 2)     Status: None (Preliminary result)   Collection Time: 09/19/20  3:07 AM   Specimen: BLOOD  Result Value Ref Range Status   Specimen Description   Final    BLOOD Blood Culture adequate volume Performed at [facility], [address] [place], [place] [address]    Special Requests   Final    BLOOD LEFT FOREARM Performed at [facility], [address], [place], [place] [address]    Culture   Final    NO GROWTH 2 DAYS Performed at [facility], [place] [address] [place], [place] [address]    Report Status PENDING  Incomplete    Studies/Results: MR HIP LEFT WO CONTRAST  Result Date: 09/20/2020 CLINICAL DATA:  Left hip and groin pain EXAM: MR OF THE LEFT HIP WITHOUT CONTRAST TECHNIQUE: Multiplanar, multisequence MR imaging was performed. No intravenous contrast was administered. COMPARISON:  CT pelvis 09/19/2020 FINDINGS: Body habitus reduces diagnostic sensitivity and specificity. Despite efforts by the technologist and patient, motion artifact is present on today's exam and could not be eliminated. This reduces exam sensitivity and specificity. The  patient was unable to complete today's examination due to  discomfort and difficulty laying supine. Bones: Postoperative findings in the lower lumbar spine. Spurring of the acetabula and femoral heads. Articular cartilage and labrum Articular cartilage: Mild degenerative chondral thinning in both hips. Labrum: Not well assessed today due to technical factors including motion artifact. Joint or bursal effusion Joint effusion:  Absent Bursae: Trace bilateral trochanteric bursitis Muscles and tendons Muscles and tendons: Prominently accentuated T2 signal is present within and along the left adductor longus muscle for example on image 40 series 7 and image 24 series 6. On the T1 weighted images I do not see a substantial degree of accentuated T1 signal in the corresponding region to necessarily indicate intramuscular hematoma. There is also some confluent edema tracking along adjacent fascia planes and along the left adductor aponeurosis for example on images 27 through 28 of series 7 and on image 24 series 6. Other findings Miscellaneous: Subcutaneous edema in the anterior pelvic subcutaneous adipose tissues and into the pubis, panniculitis is not excluded. Left external iliac node 1.5 cm in short axis on image 15 series 7, stable from 09/19/2020. Left inguinal lymph node 1.4 cm in short axis on image 35 series 7, likewise stable. Other upper normal size and mildly enlarged inguinal and external iliac lymph nodes are probably reactive. Trace free pelvic fluid.  Uterus surgically absent. IMPRESSION: 1. Extensive edema in the left adductor longus muscle in the medial upper thigh could reflect myositis or muscle tear. Intramuscular hematoma is likewise a possibility although I do not demonstrate substantially increased T1 signal within the muscle to further suggest hematoma. Associated edema tracks up along the adductor longus aponeurosis towards the pubis. 2. Subcutaneous edema in the anterior pelvic wall subcutaneous  tissues tracking down to the pubis, panniculitis is not excluded. 3. Likely reactive pelvic and inguinal lymph nodes bilaterally. 4. No hip effusion to suggest septic hip. Trace fluid in the trochanteric bursa bilaterally. 5. Trace free pelvic fluid. 6. A variety of technical factors adversely affect sensitivity of today's exam, including body habitus, motion artifact, and the patient terminating the exam prior to completion due to discomfort. Electronically Signed   By: Van Clines M.D.   On: 09/20/2020 15:37      Assessment/Plan:  INTERVAL HISTORY:< MRI not show evidence of septic hip but question of possible myositis, now patient developing evidence of cellulitis left lower extremity   Principal Problem:   Groin pain, left Active Problems:   Obstructive sleep apnea   Lymphedema of both lower extremities   Hypothyroidism   Chronic pain   Morbid obesity with BMI of 70 and over, adult ([place])   Type 2 diabetes mellitus with hyperglycemia, with long-term current use of insulin (HCC)   History of pulmonary embolism   Fatty liver   Splenomegaly   Left nephrolithiasis    Cheyenne Guzman is a 55 y.o. female with history significant for morbid by obesity diabetes mellitus chronic lymphedema with recurrent cellulitis into the ER with acute onset of intense left-sided groin pain and hip pain along with fevers nausea and vomiting.  She had blood cultures taken along with a UA that was performed and was started on vancomycin and Zosyn.  Yesterday when*she did not have clinical evidence of cellulitis but had a high concern for possible septic arthritis and stopped antibiotics and obtained an MRI of the hip which was supposed to be given with contrast though it was done without contrast.  MRI of hip does not show evidence of a septic hip but does show potential  early myositis of the left abductor longus muscle.  The interim she has developed erythema and tenderness in her left lower  extremity consistent with cellulitis.  #1 Cellulitis and also possible myositis:  I am starting linezolid '600mg'$  po BID for excellent bioavailability, activity against MRSA MSSA streptococcal species and excellent penetration and tissues.  Also has antitoxin effect which I think is desirable and current context.    Note CPKs been reviewed and have been normal.  We will reduce her dose of Zoloft to accommodate MAOI activity of linezolid.  #2 Myositis vs muscle tear: Hopefully she does not develop pyomyositis but we will her clinically and if need be repeat imaging hopefully will have to do this.  3.  Depression we will reduce her dose of Zoloft given the serotonergic effect of linezolid.  4 Kidney stones with hematuria. No clinical evidence for infection here  I spent33mnutes with the patient including greater than 50% of the time in face to face counseling of the patient and her daughter, reviewing the nature of cellulitis work-up for hip pain and her possible myositis personally reviewing MRI of the left hip performed on August 30, CBC CMP blood culture data along with review of medical records in preparation for the visit and during the visit and in coordination of her care with Dr GSabino Gasser   LOS: 2 days   CAlcide Evener8/31/2022, 3:10 PM

## 2020-09-21 NOTE — Progress Notes (Signed)
Inpatient Diabetes Program Recommendations  AACE/ADA: New Consensus Statement on Inpatient Glycemic Control (2015)  Target Ranges:  Prepandial:   less than 140 mg/dL      Peak postprandial:   less than 180 mg/dL (1-2 hours)      Critically ill patients:  140 - 180 mg/dL   Lab Results  Component Value Date   GLUCAP 291 (H) 09/21/2020   HGBA1C 10.6 (H) 09/20/2020    Review of Glycemic Control  Spoke with pt and daughters at bedside regarding her diabetes control. Pt verifies she takes Humalog 75/25 120 units BID at home. Has not taken Ozempic due to financial form not being completed and taken to MD. Pt states she's had so much stress with recent death of 2 sisters and cleaning out family home. Said she gets frustrated whenever she's hospitalized because "they never give me enough insulin."  Pt sees "new MD" at [facility]. She has f/u appt with her within the month.  Discussed importance of getting on Ozempic to improve her glucose control. Pt has been very stressed over last few months and said "I'm doing the best I can."  Inpatient Diabetes Program Recommendations:    Increase Levemir to 60 units BID and Novolog 10 units TID if eating > 50% meal.  Follow up in am. Secure text to MD.  Thank you. Lorenda Peck, RD, LDN, CDE Inpatient Diabetes Coordinator [phone]

## 2020-09-22 DIAGNOSIS — L03119 Cellulitis of unspecified part of limb: Secondary | ICD-10-CM

## 2020-09-22 DIAGNOSIS — H5703 Miosis: Secondary | ICD-10-CM

## 2020-09-22 DIAGNOSIS — M60052 Infective myositis, left thigh: Secondary | ICD-10-CM

## 2020-09-22 DIAGNOSIS — I890 Lymphedema, not elsewhere classified: Secondary | ICD-10-CM | POA: Diagnosis not present

## 2020-09-22 DIAGNOSIS — N200 Calculus of kidney: Secondary | ICD-10-CM | POA: Diagnosis not present

## 2020-09-22 DIAGNOSIS — R1032 Left lower quadrant pain: Secondary | ICD-10-CM | POA: Diagnosis not present

## 2020-09-22 LAB — CBC WITH DIFFERENTIAL/PLATELET
Abs Immature Granulocytes: 0.19 10*3/uL — ABNORMAL HIGH (ref 0.00–0.07)
Basophils Absolute: 0 10*3/uL (ref 0.0–0.1)
Basophils Relative: 1 %
Eosinophils Absolute: 0.2 10*3/uL (ref 0.0–0.5)
Eosinophils Relative: 3 %
HCT: 35 % — ABNORMAL LOW (ref 36.0–46.0)
Hemoglobin: 10.4 g/dL — ABNORMAL LOW (ref 12.0–15.0)
Immature Granulocytes: 3 %
Lymphocytes Relative: 15 %
Lymphs Abs: 1.1 10*3/uL (ref 0.7–4.0)
MCH: 23.5 pg — ABNORMAL LOW (ref 26.0–34.0)
MCHC: 29.7 g/dL — ABNORMAL LOW (ref 30.0–36.0)
MCV: 79 fL — ABNORMAL LOW (ref 80.0–100.0)
Monocytes Absolute: 0.5 10*3/uL (ref 0.1–1.0)
Monocytes Relative: 7 %
Neutro Abs: 5 10*3/uL (ref 1.7–7.7)
Neutrophils Relative %: 71 %
Platelets: 173 10*3/uL (ref 150–400)
RBC: 4.43 MIL/uL (ref 3.87–5.11)
RDW: 18.3 % — ABNORMAL HIGH (ref 11.5–15.5)
WBC: 7 10*3/uL (ref 4.0–10.5)
nRBC: 0.4 % — ABNORMAL HIGH (ref 0.0–0.2)

## 2020-09-22 LAB — BASIC METABOLIC PANEL
Anion gap: 6 (ref 5–15)
BUN: 16 mg/dL (ref 6–20)
CO2: 32 mmol/L (ref 22–32)
Calcium: 8.9 mg/dL (ref 8.9–10.3)
Chloride: 103 mmol/L (ref 98–111)
Creatinine, Ser: 1.03 mg/dL — ABNORMAL HIGH (ref 0.44–1.00)
GFR, Estimated: >60 mL/min (ref >60)
Glucose, Bld: 219 mg/dL — ABNORMAL HIGH (ref 70–99)
Potassium: 3.9 mmol/L (ref 3.5–5.1)
Sodium: 141 mmol/L (ref 135–145)

## 2020-09-22 LAB — GLUCOSE, CAPILLARY
Glucose-Capillary: 228 mg/dL — ABNORMAL HIGH (ref 70–99)
Glucose-Capillary: 282 mg/dL — ABNORMAL HIGH (ref 70–99)
Glucose-Capillary: 203 mg/dL — ABNORMAL HIGH (ref 70–99)
Glucose-Capillary: 277 mg/dL — ABNORMAL HIGH (ref 70–99)

## 2020-09-22 LAB — MAGNESIUM: Magnesium: 1.7 mg/dL (ref 1.7–2.4)

## 2020-09-22 NOTE — Progress Notes (Signed)
Subjective:  Her erythema and her left leg worsened yesterday afternoon when she was up and walking.  She has some pain also in her medial thigh today   Antibiotics:  Anti-infectives (From admission, onward)    Start     Dose/Rate Route Frequency Ordered Stop   09/21/20 1115  linezolid (ZYVOX) tablet 600 mg        600 mg Oral Every 12 hours 09/21/20 1020 10/05/20 0959   09/19/20 1030  vancomycin (VANCOCIN) IVPB 1000 mg/200 mL premix  Status:  Discontinued       See Hyperspace for full Linked Orders Report.   1,000 mg 200 mL/hr over 60 Minutes Intravenous  Once 09/19/20 0920 09/19/20 0924   09/19/20 0930  piperacillin-tazobactam (ZOSYN) IVPB 3.375 g  Status:  Discontinued        3.375 g 12.5 mL/hr over 240 Minutes Intravenous Every 8 hours 09/19/20 0919 09/20/20 0834   09/19/20 0930  vancomycin (VANCOCIN) IVPB 1000 mg/200 mL premix  Status:  Discontinued       See Hyperspace for full Linked Orders Report.   1,000 mg 200 mL/hr over 60 Minutes Intravenous  Once 09/19/20 0920 09/19/20 0924   09/19/20 0930  vancomycin (VANCOCIN) IVPB 1000 mg/200 mL premix  Status:  Discontinued        1,000 mg 200 mL/hr over 60 Minutes Intravenous Every 8 hours 09/19/20 0925 09/20/20 1301   09/19/20 0245  vancomycin (VANCOCIN) IVPB 1000 mg/200 mL premix        1,000 mg 200 mL/hr over 60 Minutes Intravenous  Once 09/19/20 0235 09/19/20 0406   09/19/20 0245  piperacillin-tazobactam (ZOSYN) IVPB 3.375 g        3.375 g 12.5 mL/hr over 240 Minutes Intravenous  Once 09/19/20 0235 09/19/20 0450       Medications: Scheduled Meds:  atorvastatin  10 mg Oral Daily   buprenorphine  1 patch Transdermal Weekly   cholecalciferol  2,000 Units Oral q morning   furosemide  40 mg Intravenous Daily   gabapentin  400 mg Oral BID   insulin aspart  0-20 Units Subcutaneous TID WC   insulin aspart  0-5 Units Subcutaneous QHS   insulin aspart  10 Units Subcutaneous TID WC   insulin detemir  55 Units  Subcutaneous BID   levothyroxine  75 mcg Oral QAC breakfast   linezolid  600 mg Oral Q12H   methocarbamol  750 mg Oral q morning   multivitamin with minerals  1 tablet Oral Daily   polyethylene glycol  17 g Oral Daily   pramipexole  0.5 mg Oral QHS   rivaroxaban  20 mg Oral Q supper   sertraline  100 mg Oral q morning   Continuous Infusions: PRN Meds:.acetaminophen **OR** acetaminophen, albuterol, bisacodyl, metoprolol tartrate, morphine injection, ondansetron **OR** ondansetron (ZOFRAN) IV, oxyCODONE, senna-docusate    Objective: Weight change:   Intake/Output Summary (Last 24 hours) at 09/22/2020 1343 Last data filed at 09/22/2020 1200 Gross per 24 hour  Intake 480 ml  Output --  Net 480 ml    Blood pressure (!) 171/71, pulse 70, temperature 98.9 F (37.2 C), temperature source Oral, resp. rate 19, height '5\' 7"'$  (1.702 m), weight (!) 196.4 kg, SpO2 94 %. Temp:  [98.2 F (36.8 C)-98.9 F (37.2 C)] 98.9 F (37.2 C) (09/01 1158) Pulse Rate:  [68-75] 70 (09/01 1158) Resp:  [15-19] 19 (09/01 1158) BP: (144-171)/(71) 171/71 (09/01 1158) SpO2:  [93 %-94 %] 94 % (09/01  1158)  Physical Exam: Physical Exam Constitutional:      General: She is not in acute distress.    Appearance: She is well-developed. She is not diaphoretic.  HENT:     Head: Normocephalic and atraumatic.     Right Ear: External ear normal.     Left Ear: External ear normal.     Mouth/Throat:     Pharynx: No oropharyngeal exudate.  Eyes:     General: No scleral icterus.    Conjunctiva/sclera: Conjunctivae normal.     Pupils: Pupils are equal, round, and reactive to light.  Cardiovascular:     Rate and Rhythm: Normal rate and regular rhythm.     Heart sounds:    No gallop.  Pulmonary:     Effort: Pulmonary effort is normal. No respiratory distress.     Breath sounds: No wheezing or rales.  Abdominal:     General: Bowel sounds are normal. There is no distension.     Palpations: Abdomen is soft.   Musculoskeletal:        General: No tenderness. Normal range of motion.  Lymphadenopathy:     Cervical: No cervical adenopathy.  Skin:    General: Skin is warm and dry.     Coloration: Skin is not pale.     Findings: Erythema present. No rash.  Neurological:     General: No focal deficit present.     Mental Status: She is alert and oriented to person, place, and time.     Motor: No abnormal muscle tone.     Coordination: Coordination normal.  Psychiatric:        Mood and Affect: Mood normal.        Behavior: Behavior normal.        Thought Content: Thought content normal.        Judgment: Judgment normal.     Did not try to manipulate her left hip joint today.  Have pain in her medial thigh though this area is not erythematous or is tender to palpation as her left lower extremity is  Left lower extremity with erythema and tenderness consistent with cellulitis at the site  09/20/2020:    Left leg 09/22/2020:     CBC:    BMET Recent Labs    09/21/20 0537 09/22/20 0520  NA 140 141  K 4.2 3.9  CL 105 103  CO2 27 32  GLUCOSE 218* 219*  BUN 16 16  CREATININE 0.99 1.03*  CALCIUM 8.9 8.9      Liver Panel  No results for input(s): PROT, ALBUMIN, AST, ALT, ALKPHOS, BILITOT, BILIDIR, IBILI in the last 72 hours.      Sedimentation Rate No results for input(s): ESRSEDRATE in the last 72 hours. C-Reactive Protein No results for input(s): CRP in the last 72 hours.  Micro Results: Recent Results (from the past 720 hour(s))  Resp Panel by RT-PCR (Flu A&B, Covid) Urine, Clean Catch     Status: None   Collection Time: 09/19/20  2:38 AM   Specimen: Urine, Clean Catch; Nasopharyngeal(NP) swabs in vial transport medium  Result Value Ref Range Status   SARS Coronavirus 2 by RT PCR NEGATIVE NEGATIVE Final    Comment: (NOTE) SARS-CoV-2 target nucleic acids are NOT DETECTED.  The SARS-CoV-2 RNA is generally detectable in upper respiratory specimens during the  acute phase of infection. The lowest concentration of SARS-CoV-2 viral copies this assay can detect is 138 copies/mL. A negative result does not preclude SARS-Cov-2 infection and should not  be used as the sole basis for treatment or other patient management decisions. A negative result may occur with  improper specimen collection/handling, submission of specimen other than nasopharyngeal swab, presence of viral mutation(s) within the areas targeted by this assay, and inadequate number of viral copies(<138 copies/mL). A negative result must be combined with clinical observations, patient history, and epidemiological information. The expected result is Negative.  Fact Sheet for Patients:  EntrepreneurPulse.com.au  Fact Sheet for Healthcare Providers:  IncredibleEmployment.be  This test is no t yet approved or cleared by the [place] FDA and  has been authorized for detection and/or diagnosis of SARS-CoV-2 by FDA under an Emergency Use Authorization (EUA). This EUA will remain  in effect (meaning this test can be used) for the duration of the COVID-19 declaration under Section 564(b)(1) of the Act, 21 U.S.C.section 360bbb-3(b)(1), unless the authorization is terminated  or revoked sooner.       Influenza A by PCR NEGATIVE NEGATIVE Final   Influenza B by PCR NEGATIVE NEGATIVE Final    Comment: (NOTE) The Xpert Xpress SARS-CoV-2/FLU/RSV plus assay is intended as an aid in the diagnosis of influenza from Nasopharyngeal swab specimens and should not be used as a sole basis for treatment. Nasal washings and aspirates are unacceptable for Xpert Xpress SARS-CoV-2/FLU/RSV testing.  Fact Sheet for Patients: EntrepreneurPulse.com.au  Fact Sheet for Healthcare Providers: IncredibleEmployment.be  This test is not yet approved or cleared by the [place] FDA and has been authorized for detection and/or  diagnosis of SARS-CoV-2 by FDA under an Emergency Use Authorization (EUA). This EUA will remain in effect (meaning this test can be used) for the duration of the COVID-19 declaration under Section 564(b)(1) of the Act, 21 U.S.C. section 360bbb-3(b)(1), unless the authorization is terminated or revoked.  Performed at [facility], [address] [place], [place] [address]   Blood culture (routine x 2)     Status: None (Preliminary result)   Collection Time: 09/19/20  2:52 AM   Specimen: BLOOD  Result Value Ref Range Status   Specimen Description   Final    BLOOD Blood Culture adequate volume Performed at [facility], [address], [place], [place] [address]    Special Requests   Final    RIGHT ANTECUBITAL Performed at [facility], [address] [place], [place] [address]    Culture   Final    NO GROWTH 3 DAYS Performed at [facility], [place] [address], [place], [place] [address]    Report Status PENDING  Incomplete  Blood culture (routine x 2)     Status: None (Preliminary result)   Collection Time: 09/19/20  3:07 AM   Specimen: BLOOD  Result Value Ref Range Status   Specimen Description   Final    BLOOD Blood Culture adequate volume Performed at [facility], [address], [place], [place] [address]    Special Requests   Final    BLOOD LEFT FOREARM Performed at [facility], [address], [place], [place] [address]    Culture   Final    NO GROWTH 3 DAYS Performed at [facility], [place] [address] [place], [place] [address]    Report Status PENDING  Incomplete    Studies/Results: MR HIP LEFT WO CONTRAST  Result Date: 09/20/2020 CLINICAL DATA:  Left hip and groin pain EXAM: MR OF THE LEFT HIP WITHOUT CONTRAST TECHNIQUE: Multiplanar, multisequence MR imaging was performed. No intravenous contrast was administered. COMPARISON:  CT pelvis 09/19/2020  FINDINGS: Body habitus reduces diagnostic sensitivity and specificity. Despite efforts by the technologist and patient, motion artifact is present on today's exam and could not be eliminated. This reduces exam sensitivity and specificity. The patient was unable to complete today's examination due to discomfort and difficulty laying supine. Bones: Postoperative findings in the lower lumbar spine. Spurring of the acetabula and femoral heads. Articular cartilage and labrum Articular cartilage: Mild degenerative chondral thinning in both hips. Labrum: Not well assessed today due to technical factors including motion artifact. Joint or bursal effusion Joint effusion:  Absent Bursae: Trace bilateral trochanteric bursitis Muscles and tendons Muscles and tendons: Prominently accentuated T2 signal is present within and along the left adductor longus muscle for example on image 40 series 7 and image 24 series 6. On the T1 weighted images I do not see a substantial degree of accentuated T1 signal in the corresponding region to necessarily indicate intramuscular hematoma. There is also some confluent edema tracking along adjacent fascia planes and along the left adductor aponeurosis for example on images 27 through 28 of series 7 and on image 24 series 6. Other findings Miscellaneous: Subcutaneous edema in the anterior pelvic subcutaneous adipose tissues and into the pubis, panniculitis is not excluded. Left external iliac node 1.5 cm in short axis on image 15 series 7, stable from 09/19/2020. Left inguinal lymph node 1.4 cm in short axis on image 35 series 7, likewise stable. Other upper normal size and mildly enlarged inguinal and external iliac lymph nodes are probably reactive. Trace free pelvic fluid.  Uterus surgically absent. IMPRESSION: 1. Extensive edema in the left adductor longus muscle in the medial upper thigh could reflect myositis or muscle tear. Intramuscular hematoma is likewise a possibility although I do not  demonstrate substantially increased T1 signal within the muscle to further suggest hematoma. Associated edema tracks up along the adductor longus aponeurosis towards the pubis. 2. Subcutaneous edema in the anterior pelvic wall subcutaneous tissues tracking down to the pubis, panniculitis is not excluded. 3. Likely reactive pelvic and inguinal lymph nodes bilaterally. 4. No hip effusion to suggest septic hip. Trace fluid in the trochanteric bursa bilaterally. 5. Trace free pelvic fluid. 6. A variety of technical factors adversely affect sensitivity of today's exam, including body habitus, motion artifact, and the patient terminating the exam prior to completion due to discomfort. Electronically Signed   By: Van Clines M.D.   On: 09/20/2020 15:37      Assessment/Plan:  INTERVAL HISTORY:  Cellulitis worsened in the context of her getting up and walking around.  Principal Problem:   Groin pain, left Active Problems:   Obstructive sleep apnea   Lymphedema of both lower extremities   Hypothyroidism   Chronic pain   Morbid obesity with BMI of 70 and over, adult ([place])   Recurrent cellulitis of lower extremity   Type 2 diabetes mellitus with hyperglycemia, with long-term current use of insulin (HCC)   History of pulmonary embolism   Fatty liver   Splenomegaly   Left nephrolithiasis    Cheyenne Guzman is a 55 y.o. female with history significant for morbid by obesity diabetes mellitus chronic lymphedema with recurrent cellulitis into the ER with acute onset of intense left-sided groin pain and hip pain along with fevers nausea and vomiting.  She had blood cultures taken along with a UA that was performed and was started on vancomycin and Zosyn.  Originally  when*she did not have clinical evidence of cellulitis but had a  high concern for possible septic arthritis and stopped antibiotics and obtained an MRI of the hip which was supposed to be given with contrast though it was done without  contrast.  MRI of hip did not show evidence of a septic hip but does show potential early myositis of the left abductor longus muscle.  The interim sh developed erythema and tenderness in her left lower extremity consistent with cellulitis.  #1 Cellulitis and also possible myositis:  I started her on Zyvox yesterday and we reduced her SSRI to accommodate this antibiotic tube.  We will plan on giving her 2 weeks of therapy.  She should elevate her leg above the heart if possible  I am fine if she wants to use compression stockings  I will see her in clinic for follow-up and I will furnish her with antibiotics to have at home likely cephalexin or cefadroxil to be taken with onset of symptoms suggestive of colitis.  #2  Myositis versus muscle tear.  I am inclined to believe that this might likely is a muscle tear rather than an infectious myositis but will need to monitor this very carefully.  #3 pression: On Zoloft with dose reduced in the context of Zyvox    #4  kidney  stones: These seem to be resolving with her hematuria improving   I spent  38 minutes with the patient including >50% of the time in  face to face counseling of the patient and her family re the nature of colitis differential for hip pain including infectious myositis muscle tear, personally reviewing culture data CBC with differential metabolic panel, along with virological data, review of medical records in preparation for the visit and during the visit and in coordination of her care with Dr. Sabino Gasser  I think she can be safely DC to home   MONCIA MCCLURE has an appointment on 10/07/2020 at 25 AM with Dr. Tommy Medal  [facility] is located in the [facility] at  [place] in [place].  [address], which is located to the left of the elevators.  Phone: [phone]  Fax: [phone]  [email or web address]  She should arrive 15-30  minutes prior to her appointment.   LOS: 3 days   Alcide Evener 09/22/2020, 1:43 PM

## 2020-09-22 NOTE — Progress Notes (Signed)
Progress Note    Cheyenne Guzman   [id number]  DOB: 07-01-1965  DOA: 09/19/2020     3  PCP: Laurey Morale, MD  Initial CC: left groin pain  Hospital Course: [name] is a 55 yo female with PMH OSA, prior LLE cellulitis, hx breast CA, DMII, morbid obesity, chronic low back pain, chronic severe lymphedema who presented to [facility] with left groin pain.  She underwent workup with CT A/P which showed skin thickening and subcutaneous edema in the lower abdominal wall as well as diffuse body wall edema.  She was started on antibiotics for presumed cellulitis however had no skin findings consistent with cellulitis. There was also noted 9 mm left lower renal pole stone on recent CT from 09/16/2020.  There was no associated hydronephrosis or hydroureter.  Interval History:  No events overnight.  Mood more pleasant today.  Still endorses ongoing pain in left groin and her left leg is still tender to light touch.  Family present this morning.  Updates given with questions answered. We discussed that she is essentially medically stable at this point and we are now just working on further pain control in order to ensure she is safe and comfortable going home.   ROS: Constitutional: negative for chills and fevers, Respiratory: negative, Cardiovascular: negative for chest pain, and Gastrointestinal: negative for abdominal pain  Assessment & Plan: * Groin pain, left - Initially was suspected to have abdominal cellulitis given subcutaneous edema noted on CT abdomen/pelvis however there are no skin findings such as erythema, tenderness, calor.  Low suspicion for abdominal wall cellulitis after exam.  ID agreed and abx were stopped - MRI left hip also obtained on 8/30 to further evaluate: this showed possibly myositis (though CK normal x 2) vs extensive edema - starting patient on IV lasix to help with some of this - re-eval by ID, she has been started on Zyvox for empiric treatment if myositis, but  also for new cellulitis on LLE that was not present on our exams on 8/30 (now present, 8/31)  -Also discussed 9 mm nephrolithiasis noted on CT abdomen/pelvis with urology.  This does not appear to be contributing to her symptoms after discussion and review of imaging.  Microscopic hematuria is expected in this setting which was present on her UA on admission as well  - she is medically stable for d/c but we are currently still working on pain control; she is hopeful for d/c in the next 1-2 days pending this   Recurrent cellulitis of lower extremity - as of 8/31, her LLE is now TTP with associated calor and dolor. Exam consistent with cellulitis; not significantly erythematous yet but skin is also chronically hyperpigmented  - continue zyvox as per ID - continue following clinically; erythema has improved some; TTP still present - ID plans to send patient with abx to have in case of future flare of cellulitis   Left nephrolithiasis - 9 mm nonobstructing left stone noted on CT; micro hematuria noted on UA - discussed with urology; not contributing to patient's symptoms as noted above   Fatty liver - continue lipitor   History of pulmonary embolism - On chronic Xarelto  Type 2 diabetes mellitus with hyperglycemia, with long-term current use of insulin (HCC) - continue SSI and CBG monitoring  Morbid obesity with BMI of 70 and over, adult [facility]) - limited mobility; last cardiology visit on 08/09/20 was virtual for patient comfort - Complicates overall prognosis and care - Body mass  index is 70.48 kg/m.  Chronic pain - Managed by outpatient pain clinic.  On buprenorphine patch  Hypothyroidism - continue TSH   Lymphedema of both lower extremities - chronic and wears compression stockings   Obstructive sleep apnea - continue nightly CPAP   Old records reviewed in assessment of this patient  Antimicrobials: Vanc 8/29 >> 8/30 Zosyn 8/29 >> 8/30 Zyvox 09/21/2020 >> current  DVT  prophylaxis: SCDs Start: 09/19/20 1808 rivaroxaban (XARELTO) tablet 20 mg   Code Status:   Code Status: Full Code Family Communication: family in room  Disposition Plan: Status is: Inpatient  Remains inpatient appropriate because:Ongoing diagnostic testing needed not appropriate for outpatient work up, IV treatments appropriate due to intensity of illness or inability to take PO, and Inpatient level of care appropriate due to severity of illness  Dispo: The patient is from: Home              Anticipated d/c is to: Home              Patient currently is not medically stable to d/c.   Difficult to place patient No  Risk of unplanned readmission score: Unplanned Admission- Pilot do not use: 16.81   Objective: Blood pressure (!) 171/71, pulse 70, temperature 98.9 F (37.2 C), temperature source Oral, resp. rate 19, height '5\' 7"'$  (1.702 m), weight (!) 196.4 kg, SpO2 94 %.  Examination: General appearance: alert, cooperative, no distress, and morbidly obese Head: Normocephalic, without obvious abnormality, atraumatic Eyes:  EOMI Lungs: clear to auscultation bilaterally Heart: regular rate and rhythm and S1, S2 normal Abdomen:  obese; soft; non tender throughout; BS distant but present Extremities:  Left lower extremity now appreciated with calor, dolor. Mild erythema appreciated but skin is hyperpigmented from chronic stasis.   Skin: mobility and turgor normal Neurologic: Grossly normal  Consultants:  ID  Procedures:    Data Reviewed: I have personally reviewed following labs and imaging studies Results for orders placed or performed during the hospital encounter of 09/19/20 (from the past 24 hour(s))  Glucose, capillary     Status: Abnormal   Collection Time: 09/21/20  5:29 PM  Result Value Ref Range   Glucose-Capillary 273 (H) 70 - 99 mg/dL   Comment 1 Notify RN   Glucose, capillary     Status: Abnormal   Collection Time: 09/21/20  9:12 PM  Result Value Ref Range    Glucose-Capillary 296 (H) 70 - 99 mg/dL  Basic metabolic panel     Status: Abnormal   Collection Time: 09/22/20  5:20 AM  Result Value Ref Range   Sodium 141 135 - 145 mmol/L   Potassium 3.9 3.5 - 5.1 mmol/L   Chloride 103 98 - 111 mmol/L   CO2 32 22 - 32 mmol/L   Glucose, Bld 219 (H) 70 - 99 mg/dL   BUN 16 6 - 20 mg/dL   Creatinine, Ser 1.03 (H) 0.44 - 1.00 mg/dL   Calcium 8.9 8.9 - 10.3 mg/dL   GFR, Estimated >60 >60 mL/min   Anion gap 6 5 - 15  CBC with Differential/Platelet     Status: Abnormal   Collection Time: 09/22/20  5:20 AM  Result Value Ref Range   WBC 7.0 4.0 - 10.5 K/uL   RBC 4.43 3.87 - 5.11 MIL/uL   Hemoglobin 10.4 (L) 12.0 - 15.0 g/dL   HCT 35.0 (L) 36.0 - 46.0 %   MCV 79.0 (L) 80.0 - 100.0 fL   MCH 23.5 (L) 26.0 - 34.0  pg   MCHC 29.7 (L) 30.0 - 36.0 g/dL   RDW 18.3 (H) 11.5 - 15.5 %   Platelets 173 150 - 400 K/uL   nRBC 0.4 (H) 0.0 - 0.2 %   Neutrophils Relative % 71 %   Neutro Abs 5.0 1.7 - 7.7 K/uL   Lymphocytes Relative 15 %   Lymphs Abs 1.1 0.7 - 4.0 K/uL   Monocytes Relative 7 %   Monocytes Absolute 0.5 0.1 - 1.0 K/uL   Eosinophils Relative 3 %   Eosinophils Absolute 0.2 0.0 - 0.5 K/uL   Basophils Relative 1 %   Basophils Absolute 0.0 0.0 - 0.1 K/uL   Immature Granulocytes 3 %   Abs Immature Granulocytes 0.19 (H) 0.00 - 0.07 K/uL  Magnesium     Status: None   Collection Time: 09/22/20  5:20 AM  Result Value Ref Range   Magnesium 1.7 1.7 - 2.4 mg/dL  Glucose, capillary     Status: Abnormal   Collection Time: 09/22/20  8:20 AM  Result Value Ref Range   Glucose-Capillary 228 (H) 70 - 99 mg/dL   Comment 1 Notify RN    Comment 2 Document in Chart   Glucose, capillary     Status: Abnormal   Collection Time: 09/22/20 11:54 AM  Result Value Ref Range   Glucose-Capillary 282 (H) 70 - 99 mg/dL   Comment 1 Notify RN    Comment 2 Document in Chart     Recent Results (from the past 240 hour(s))  Resp Panel by RT-PCR (Flu A&B, Covid) Urine, Clean  Catch     Status: None   Collection Time: 09/19/20  2:38 AM   Specimen: Urine, Clean Catch; Nasopharyngeal(NP) swabs in vial transport medium  Result Value Ref Range Status   SARS Coronavirus 2 by RT PCR NEGATIVE NEGATIVE Final    Comment: (NOTE) SARS-CoV-2 target nucleic acids are NOT DETECTED.  The SARS-CoV-2 RNA is generally detectable in upper respiratory specimens during the acute phase of infection. The lowest concentration of SARS-CoV-2 viral copies this assay can detect is 138 copies/mL. A negative result does not preclude SARS-Cov-2 infection and should not be used as the sole basis for treatment or other patient management decisions. A negative result may occur with  improper specimen collection/handling, submission of specimen other than nasopharyngeal swab, presence of viral mutation(s) within the areas targeted by this assay, and inadequate number of viral copies(<138 copies/mL). A negative result must be combined with clinical observations, patient history, and epidemiological information. The expected result is Negative.  Fact Sheet for Patients:  EntrepreneurPulse.com.au  Fact Sheet for Healthcare Providers:  IncredibleEmployment.be  This test is no t yet approved or cleared by the [place] FDA and  has been authorized for detection and/or diagnosis of SARS-CoV-2 by FDA under an Emergency Use Authorization (EUA). This EUA will remain  in effect (meaning this test can be used) for the duration of the COVID-19 declaration under Section 564(b)(1) of the Act, 21 U.S.C.section 360bbb-3(b)(1), unless the authorization is terminated  or revoked sooner.       Influenza A by PCR NEGATIVE NEGATIVE Final   Influenza B by PCR NEGATIVE NEGATIVE Final    Comment: (NOTE) The Xpert Xpress SARS-CoV-2/FLU/RSV plus assay is intended as an aid in the diagnosis of influenza from Nasopharyngeal swab specimens and should not be used as a sole  basis for treatment. Nasal washings and aspirates are unacceptable for Xpert Xpress SARS-CoV-2/FLU/RSV testing.  Fact Sheet for Patients: EntrepreneurPulse.com.au  Fact  Sheet for Healthcare Providers: IncredibleEmployment.be  This test is not yet approved or cleared by the [place] and has been authorized for detection and/or diagnosis of SARS-CoV-2 by FDA under an Emergency Use Authorization (EUA). This EUA will remain in effect (meaning this test can be used) for the duration of the COVID-19 declaration under Section 564(b)(1) of the Act, 21 U.S.C. section 360bbb-3(b)(1), unless the authorization is terminated or revoked.  Performed at [facility], [address], [place], [place] [address]   Blood culture (routine x 2)     Status: None (Preliminary result)   Collection Time: 09/19/20  2:52 AM   Specimen: BLOOD  Result Value Ref Range Status   Specimen Description   Final    BLOOD Blood Culture adequate volume Performed at [facility], [address], [place], [place] [address]    Special Requests   Final    RIGHT ANTECUBITAL Performed at [facility], [address] [place], [place] [address]    Culture   Final    NO GROWTH 3 DAYS Performed at [facility], [place] [address] [place], [place] [address]    Report Status PENDING  Incomplete  Blood culture (routine x 2)     Status: None (Preliminary result)   Collection Time: 09/19/20  3:07 AM   Specimen: BLOOD  Result Value Ref Range Status   Specimen Description   Final    BLOOD Blood Culture adequate volume Performed at [facility], [address], [place], [place] [address]    Special Requests   Final    BLOOD LEFT FOREARM Performed at [facility], [address] [place], [place] [address]    Culture   Final    NO GROWTH 3 DAYS Performed at [facility], [place] [address] [place], [place] [address]    Report Status PENDING  Incomplete     Radiology Studies: No results found. MR HIP LEFT WO CONTRAST  Final Result    CT ABDOMEN PELVIS W CONTRAST  Final Result    DG Chest Portable 1 View  Final Result      Scheduled Meds:  atorvastatin  10 mg Oral Daily   buprenorphine  1 patch Transdermal Weekly   cholecalciferol  2,000 Units Oral q morning   furosemide  40 mg Intravenous Daily   gabapentin  400 mg Oral BID   insulin aspart  0-20 Units Subcutaneous TID WC   insulin aspart  0-5 Units Subcutaneous QHS   insulin aspart  10 Units Subcutaneous TID WC   insulin detemir  55 Units Subcutaneous BID   levothyroxine  75 mcg Oral QAC breakfast   linezolid  600 mg Oral Q12H   methocarbamol  750 mg Oral q morning   multivitamin with minerals  1 tablet Oral Daily   polyethylene glycol  17 g Oral Daily   pramipexole  0.5 mg Oral QHS   rivaroxaban  20 mg Oral Q supper   sertraline  100 mg Oral q morning   PRN Meds: acetaminophen **OR** acetaminophen, albuterol, bisacodyl, metoprolol tartrate, morphine injection, ondansetron **OR** ondansetron (ZOFRAN) IV, oxyCODONE, senna-docusate Continuous Infusions:   LOS: 3 days  Time spent: Greater than 50% of the 35 minute visit was spent in counseling/coordination of care for the patient as laid out in the A&P.   Dwyane Dee, MD [facility] 09/22/2020, 4:27 PM

## 2020-09-23 ENCOUNTER — Other Ambulatory Visit (HOSPITAL_COMMUNITY): Payer: Self-pay

## 2020-09-23 LAB — BASIC METABOLIC PANEL
Anion gap: 11 (ref 5–15)
BUN: 15 mg/dL (ref 6–20)
CO2: 26 mmol/L (ref 22–32)
Calcium: 9 mg/dL (ref 8.9–10.3)
Chloride: 99 mmol/L (ref 98–111)
Creatinine, Ser: 0.92 mg/dL (ref 0.44–1.00)
GFR, Estimated: >60 mL/min (ref >60)
Glucose, Bld: 198 mg/dL — ABNORMAL HIGH (ref 70–99)
Potassium: 5 mmol/L (ref 3.5–5.1)
Sodium: 136 mmol/L (ref 135–145)

## 2020-09-23 LAB — CBC WITH DIFFERENTIAL/PLATELET
Abs Immature Granulocytes: 0.29 10*3/uL — ABNORMAL HIGH (ref 0.00–0.07)
Basophils Absolute: 0 10*3/uL (ref 0.0–0.1)
Basophils Relative: 1 %
Eosinophils Absolute: 0.2 10*3/uL (ref 0.0–0.5)
Eosinophils Relative: 3 %
HCT: 40.1 % (ref 36.0–46.0)
Hemoglobin: 11.9 g/dL — ABNORMAL LOW (ref 12.0–15.0)
Immature Granulocytes: 4 %
Lymphocytes Relative: 15 %
Lymphs Abs: 1.2 10*3/uL (ref 0.7–4.0)
MCH: 23.1 pg — ABNORMAL LOW (ref 26.0–34.0)
MCHC: 29.7 g/dL — ABNORMAL LOW (ref 30.0–36.0)
MCV: 77.9 fL — ABNORMAL LOW (ref 80.0–100.0)
Monocytes Absolute: 0.5 10*3/uL (ref 0.1–1.0)
Monocytes Relative: 7 %
Neutro Abs: 5.5 10*3/uL (ref 1.7–7.7)
Neutrophils Relative %: 70 %
Platelets: 211 10*3/uL (ref 150–400)
RBC: 5.15 MIL/uL — ABNORMAL HIGH (ref 3.87–5.11)
RDW: 18.9 % — ABNORMAL HIGH (ref 11.5–15.5)
WBC: 7.8 10*3/uL (ref 4.0–10.5)
nRBC: 0.4 % — ABNORMAL HIGH (ref 0.0–0.2)

## 2020-09-23 LAB — MAGNESIUM: Magnesium: 1.7 mg/dL (ref 1.7–2.4)

## 2020-09-23 MED ORDER — LINEZOLID 600 MG PO TABS
600.0000 mg | ORAL_TABLET | Freq: Two times a day (BID) | ORAL | 0 refills | Status: AC
  Filled 2020-09-23: qty 22, 11d supply, fill #0

## 2020-09-23 MED ORDER — OXYCODONE HCL 5 MG PO TABS
5.0000 mg | ORAL_TABLET | ORAL | 0 refills | Status: AC | PRN
  Filled 2020-09-23: qty 30, 5d supply, fill #0

## 2020-09-23 NOTE — Plan of Care (Signed)

## 2020-09-23 NOTE — Discharge Summary (Signed)
Physician Discharge Summary   Cheyenne Guzman ERD:[id number] DOB: 10/05/1965 DOA: 09/19/2020  PCP: Laurey Morale, MD  Admit date: 09/19/2020 Discharge date: 09/23/2020   Admitted From: home Disposition:  home Discharging physician: Dwyane Dee, MD  Recommendations for Outpatient Follow-up:  Follow up with ID  Home Health:  Equipment/Devices:   Patient discharged to home in Discharge Condition: stable Risk of unplanned readmission score: Unplanned Admission- Pilot do not use: 16.28  CODE STATUS: Full Diet recommendation:  Diet Orders (From admission, onward)     Start     Ordered   09/23/20 0000  Diet Carb Modified        09/23/20 1008   09/21/20 1635  Diet Carb Modified Fluid consistency: Thin; Room service appropriate? Yes  Diet effective now       Question Answer Comment  Diet-HS Snack? Nothing   Calorie Level Medium 1600-2000   Fluid consistency: Thin   Room service appropriate? Yes      09/21/20 [place]            Hospital Course: Cheyenne Guzman is a 55 yo female with PMH OSA, prior LLE cellulitis, hx breast CA, DMII, morbid obesity, chronic low back pain, chronic severe lymphedema who presented to [facility] ER with left groin pain.  She underwent workup with CT A/P which showed skin thickening and subcutaneous edema in the lower abdominal wall as well as diffuse body wall edema.  She was started on antibiotics for presumed cellulitis however had no skin findings consistent with cellulitis. There was also noted 9 mm left lower renal pole stone on recent CT from 09/16/2020.  There was no associated hydronephrosis or hydroureter.  See below for further A&P  * Groin pain, left - Initially was suspected to have abdominal cellulitis given subcutaneous edema noted on CT abdomen/pelvis however there are no skin findings such as erythema, tenderness, calor.  Low suspicion for abdominal wall cellulitis after exam.  ID agreed and abx were stopped - MRI left hip also obtained on 8/30 to  further evaluate: this showed possibly myositis (though CK normal x 2) vs extensive edema - starting patient on IV lasix to help with some of this - re-eval by ID, she has been started on Zyvox for empiric treatment if myositis, but also for new cellulitis on LLE that was not present on our exams on 8/30 (now present, 8/31)--- see cellulitis as separate problem -Overall the groin pain slowly improved.  She was continued on her home dose of oral Lasix at discharge  -Also discussed 9 mm nephrolithiasis noted on CT abdomen/pelvis with urology.  This does not appear to be contributing to her symptoms after discussion and review of imaging.  Microscopic hematuria is expected in this setting which was present on her UA on admission as well  Recurrent cellulitis of lower extremity - as of 8/31, her LLE is now TTP with associated calor and dolor. Exam consistent with cellulitis; not significantly erythematous yet but skin is also chronically hyperpigmented  - continue zyvox as per ID; prescription provided at discharge - continue following clinically; erythema and tenderness improved prior to discharge  Left nephrolithiasis - 9 mm nonobstructing left stone noted on CT; micro hematuria noted on UA - discussed with urology; not contributing to patient's symptoms as noted above   Fatty liver - continue lipitor   History of pulmonary embolism - On chronic Xarelto  Type 2 diabetes mellitus with hyperglycemia, with long-term current use of insulin (HCC) - continue SSI and  CBG monitoring  Morbid obesity with BMI of 70 and over, adult [facility]) - limited mobility; last cardiology visit on 08/09/20 was virtual for patient comfort - Complicates overall prognosis and care - Body mass index is 70.48 kg/m.  Chronic pain - Managed by outpatient pain clinic.  On buprenorphine patch  Hypothyroidism - continue synthroid   Lymphedema of both lower extremities - chronic and wears compression stockings    Obstructive sleep apnea - continue nightly CPAP    The patient's chronic medical conditions were treated accordingly per the patient's home medication regimen except as noted.  On day of discharge, patient was felt deemed stable for discharge. Patient/family member advised to call PCP or come back to ER if needed.   Principal Diagnosis: Groin pain, left  Discharge Diagnoses: Active Hospital Problems   Diagnosis Date Noted   Groin pain, left 09/19/2020    Priority: High   Recurrent cellulitis of lower extremity 07/31/2018    Priority: High   Left nephrolithiasis 09/19/2020    Priority: Low   Fatty liver 09/19/2020   Splenomegaly 09/19/2020   Type 2 diabetes mellitus with hyperglycemia, with long-term current use of insulin ([place]) 09/18/2018   Morbid obesity with BMI of 70 and over, adult ([place])    Chronic pain 06/19/2018   History of pulmonary embolism 2020   Hypothyroidism 08/14/2017   Lymphedema of both lower extremities 10/18/2015   Obstructive sleep apnea 07/31/2013    Resolved Hospital Problems  No resolved problems to display.    Discharge Instructions     Diet Carb Modified   Complete by: As directed    Increase activity slowly   Complete by: As directed       Allergies as of 09/23/2020       Reactions   Vicodin [hydrocodone-acetaminophen] Nausea And Vomiting        Medication List     STOP taking these medications    doxycycline 100 MG capsule Commonly known as: VIBRAMYCIN       TAKE these medications    Airborne Gummies Chew Chew 3 tablets by mouth every morning.   albuterol (2.5 MG/3ML) 0.083% nebulizer solution Commonly known as: PROVENTIL 1 VIAL IN NEBULIZER EVERY 4 HOURS AS NEEDED FOR WHEEZING What changed: See the new instructions.   albuterol 108 (90 Base) MCG/ACT inhaler Commonly known as: VENTOLIN HFA Inhale 2 puffs into the lungs every 4 (four) hours as needed for wheezing or shortness of breath. What changed: Another  medication with the same name was changed. Make sure you understand how and when to take each.   ALPRAZolam 1 MG tablet Commonly known as: XANAX TAKE 1 TABLET BY MOUTH THREE TIMES A DAY What changed:  when to take this reasons to take this   atorvastatin 10 MG tablet Commonly known as: LIPITOR Take 1 tablet (10 mg total) by mouth daily. What changed: when to take this   buprenorphine 15 MCG/HR Commonly known as: BUTRANS Place 1 patch onto the skin every Monday.   Contour Next Monitor w/Device Kit 1 each by Does not apply route 2 (two) times daily. To check blood sugars twice a day.   furosemide 40 MG tablet Commonly known as: LASIX TAKE 1 TABLET BY MOUTH TWICE A DAY What changed:  when to take this additional instructions   gabapentin 300 MG capsule Commonly known as: NEURONTIN Take 300 mg by mouth 2 (two) times daily. Take with a 100 mg capsule for a total dose of 400 mg twice daily  gabapentin 100 MG capsule Commonly known as: NEURONTIN Take 100 mg by mouth 2 (two) times daily. Take with a 300 mg capsule for a total dose of 400 mg twice daily   glucose blood test strip Use as instructed   glucose blood test strip Commonly known as: Contour Next Test Use to test blood sugar 2 times daily   HumaLOG Mix 75/25 (75-25) 100 UNIT/ML Susp injection Generic drug: insulin lispro protamine-lispro INJECT 110 UNITS INTO THE SKIN 2 TIMES DAILY WITH A MEAL. What changed: See the new instructions.   INSULIN SYRINGE 1CC/31GX5/16" 31G X 5/16" 1 ML Misc 2x daily   levothyroxine 75 MCG tablet Commonly known as: SYNTHROID TAKE 1 TABLET BY MOUTH EVERY DAY What changed: when to take this   lidocaine 5 % ointment Commonly known as: XYLOCAINE Apply 1 application topically 2 (two) times daily as needed (pain).   linezolid 600 MG tablet Commonly known as: ZYVOX Take 1 tablet (600 mg total) by mouth every 12 (twelve) hours for 11 days.   metFORMIN 1000 MG tablet Commonly known  as: GLUCOPHAGE TAKE 1 TABLET BY MOUTH EVERY DAY WITH BREAKFAST What changed: See the new instructions.   methocarbamol 750 MG tablet Commonly known as: ROBAXIN Take 1 tablet (750 mg total) by mouth 2 (two) times daily. What changed: when to take this   multivitamin with minerals Tabs tablet Take 1 tablet by mouth daily.   oxyCODONE 5 MG immediate release tablet Commonly known as: Oxy IR/ROXICODONE Take 1 tablet (5 mg total) by mouth every 4 (four) hours as needed for up to 5 days for moderate, severe or breakthrough pain.   Ozempic (0.25 or 0.5 MG/DOSE) 2 MG/1.5ML Sopn Generic drug: Semaglutide(0.25 or 0.5MG/DOS) Inject 0.375 mLs (0.5 mg total) into the skin once a week.   pramipexole 0.5 MG tablet Commonly known as: MIRAPEX TAKE 1 TABLET BY MOUTH EVERYDAY AT BEDTIME What changed: See the new instructions.   sertraline 100 MG tablet Commonly known as: ZOLOFT TAKE 2 TABLETS (200 MG TOTAL) BY MOUTH AT BEDTIME. What changed: when to take this   Vitamin D3 50 MCG (2000 UT) Tabs Take 2,000 Units by mouth every morning.   Xarelto 20 MG Tabs tablet Generic drug: rivaroxaban TAKE 1 TABLET BY MOUTH EVERY DAY What changed:  how much to take when to take this        Allergies  Allergen Reactions   Vicodin [Hydrocodone-Acetaminophen] Nausea And Vomiting    Consultations: ID  Discharge Exam: BP (!) 144/70 (BP Location: Right Wrist)   Pulse 70   Temp 98.2 F (36.8 C) (Oral)   Resp 20   Ht '5\' 7"'  (1.702 m)   Wt (!) 209.9 kg   SpO2 93%   BMI 72.48 kg/m  General appearance: alert, cooperative, no distress, and morbidly obese Head: Normocephalic, without obvious abnormality, atraumatic Eyes:  EOMI Lungs: clear to auscultation bilaterally Heart: regular rate and rhythm and S1, S2 normal Abdomen:  obese; soft; non tender throughout; BS distant but present Extremities:  Left lower extremity improved erythema and improved TTP (minimal at d/c); chronic hyperpigmented skin  consistent with stasis changes  Skin: mobility and turgor normal Neurologic: Grossly normal  The results of significant diagnostics from this hospitalization (including imaging, microbiology, ancillary and laboratory) are listed below for reference.   Microbiology: Recent Results (from the past 240 hour(s))  Resp Panel by RT-PCR (Flu A&B, Covid) Urine, Clean Catch     Status: None   Collection Time: 09/19/20  2:38 AM   Specimen: Urine, Clean Catch; Nasopharyngeal(NP) swabs in vial transport medium  Result Value Ref Range Status   SARS Coronavirus 2 by RT PCR NEGATIVE NEGATIVE Final    Comment: (NOTE) SARS-CoV-2 target nucleic acids are NOT DETECTED.  The SARS-CoV-2 RNA is generally detectable in upper respiratory specimens during the acute phase of infection. The lowest concentration of SARS-CoV-2 viral copies this assay can detect is 138 copies/mL. A negative result does not preclude SARS-Cov-2 infection and should not be used as the sole basis for treatment or other patient management decisions. A negative result may occur with  improper specimen collection/handling, submission of specimen other than nasopharyngeal swab, presence of viral mutation(s) within the areas targeted by this assay, and inadequate number of viral copies(<138 copies/mL). A negative result must be combined with clinical observations, patient history, and epidemiological information. The expected result is Negative.  Fact Sheet for Patients:  EntrepreneurPulse.com.au  Fact Sheet for Healthcare Providers:  IncredibleEmployment.be  This test is no t yet approved or cleared by the [place] FDA and  has been authorized for detection and/or diagnosis of SARS-CoV-2 by FDA under an Emergency Use Authorization (EUA). This EUA will remain  in effect (meaning this test can be used) for the duration of the COVID-19 declaration under Section 564(b)(1) of the Act,  21 U.S.C.section 360bbb-3(b)(1), unless the authorization is terminated  or revoked sooner.       Influenza A by PCR NEGATIVE NEGATIVE Final   Influenza B by PCR NEGATIVE NEGATIVE Final    Comment: (NOTE) The Xpert Xpress SARS-CoV-2/FLU/RSV plus assay is intended as an aid in the diagnosis of influenza from Nasopharyngeal swab specimens and should not be used as a sole basis for treatment. Nasal washings and aspirates are unacceptable for Xpert Xpress SARS-CoV-2/FLU/RSV testing.  Fact Sheet for Patients: EntrepreneurPulse.com.au  Fact Sheet for Healthcare Providers: IncredibleEmployment.be  This test is not yet approved or cleared by the [place] FDA and has been authorized for detection and/or diagnosis of SARS-CoV-2 by FDA under an Emergency Use Authorization (EUA). This EUA will remain in effect (meaning this test can be used) for the duration of the COVID-19 declaration under Section 564(b)(1) of the Act, 21 U.S.C. section 360bbb-3(b)(1), unless the authorization is terminated or revoked.  Performed at [facility], [address], [place], [place] [address]   Blood culture (routine x 2)     Status: None (Preliminary result)   Collection Time: 09/19/20  2:52 AM   Specimen: BLOOD  Result Value Ref Range Status   Specimen Description   Final    BLOOD Blood Culture adequate volume Performed at [facility], [address], [place], [place] [address]    Special Requests   Final    RIGHT ANTECUBITAL Performed at [facility], [address] [place], [place] [address]    Culture   Final    NO GROWTH 4 DAYS Performed at [facility], [place] [address], [place], [place] [address]    Report Status PENDING  Incomplete  Blood culture (routine x 2)     Status: None (Preliminary result)   Collection Time: 09/19/20  3:07 AM   Specimen: BLOOD  Result Value Ref Range Status    Specimen Description   Final    BLOOD Blood Culture adequate volume Performed at [facility], [address] [place], [place] [address]    Special Requests   Final    BLOOD LEFT FOREARM Performed at [facility], [address] [place], [place] [address]    Culture   Final    NO GROWTH 4 DAYS Performed at [facility], [place] [address] [place], [place] [address]    Report Status PENDING  Incomplete     Labs: BNP (last 3 results) Recent Labs    02/19/20 1611  BNP 18   Basic Metabolic Panel: Recent Labs  Lab 09/19/20 0238 09/20/20 0724 09/21/20 0537 09/22/20 0520 09/23/20 0527  NA 139 138 140 141 136  K 4.4 4.2 4.2 3.9 5.0  CL 102 105 105 103 99  CO2 '28 25 27 ' 32 26  GLUCOSE 164* 207* 218* 219* 198*  BUN '19 20 16 16 15  ' CREATININE 1.04* 1.06* 0.99 1.03* 0.92  CALCIUM 9.3 8.7* 8.9 8.9 9.0  MG  --   --  2.0 1.7 1.7   Liver Function Tests: Recent Labs  Lab 09/19/20 0238  AST 22  ALT 15  ALKPHOS 80  BILITOT 0.7  PROT 7.3  ALBUMIN 4.1   No results for input(s): LIPASE, AMYLASE in the last 168 hours. No results for input(s): AMMONIA in the last 168 hours. CBC: Recent Labs  Lab 09/19/20 0238 09/20/20 0724 09/21/20 0537 09/22/20 0520 09/23/20 0735  WBC 10.3 8.7 8.4 7.0 7.8  NEUTROABS 9.0*  --  6.2 5.0 5.5  HGB 11.8* 10.6* 10.3* 10.4* 11.9*  HCT 40.1 35.3* 34.4* 35.0* 40.1  MCV 78.3* 79.1* 79.1* 79.0* 77.9*  PLT 204 147* 153 173 211   Cardiac Enzymes: Recent Labs  Lab 09/20/20 1814 09/21/20 0537  CKTOTAL 135 80   BNP: Invalid input(s): POCBNP CBG: Recent Labs  Lab 09/21/20 2112 09/22/20 0820 09/22/20 1154 09/22/20 1834 09/22/20 2154  GLUCAP 296* 228* 282* 203* 277*   D-Dimer No results for input(s): DDIMER in the last 72 hours. Hgb A1c No results for input(s): HGBA1C in the last 72 hours. Lipid Profile No results for input(s): CHOL, HDL, LDLCALC, TRIG, CHOLHDL, LDLDIRECT in the last 72  hours. Thyroid function studies No results for input(s): TSH, T4TOTAL, T3FREE, THYROIDAB in the last 72 hours.  Invalid input(s): FREET3 Anemia work up No results for input(s): VITAMINB12, FOLATE, FERRITIN, TIBC, IRON, RETICCTPCT in the last 72 hours. Urinalysis    Component Value Date/Time   COLORURINE YELLOW 09/20/2020 1453   APPEARANCEUR CLEAR 09/20/2020 1453   APPEARANCEUR Clear 07/31/2013 1404   LABSPEC 1.015 09/20/2020 1453   PHURINE 6.0 09/20/2020 1453   GLUCOSEU NEGATIVE 09/20/2020 1453   HGBUR SMALL (A) 09/20/2020 1453   HGBUR negative 09/28/2009 1044   BILIRUBINUR NEGATIVE 09/20/2020 1453   BILIRUBINUR neg 05/19/2020 1108   BILIRUBINUR Negative 07/31/2013 1404   KETONESUR NEGATIVE 09/20/2020 1453   PROTEINUR NEGATIVE 09/20/2020 1453   UROBILINOGEN 0.2 05/19/2020 1108   UROBILINOGEN 0.2 07/03/2012 1027   NITRITE NEGATIVE 09/20/2020 1453   LEUKOCYTESUR NEGATIVE 09/20/2020 1453   Sepsis Labs Invalid input(s): PROCALCITONIN,  WBC,  LACTICIDVEN Microbiology Recent Results (from the past 240 hour(s))  Resp Panel by RT-PCR (Flu A&B, Covid) Urine, Clean Catch     Status: None   Collection Time: 09/19/20  2:38 AM   Specimen: Urine, Clean Catch; Nasopharyngeal(NP) swabs in vial transport medium  Result Value Ref Range Status   SARS Coronavirus 2 by RT PCR NEGATIVE NEGATIVE Final    Comment: (NOTE) SARS-CoV-2 target nucleic acids are NOT DETECTED.  The SARS-CoV-2 RNA is generally detectable in upper respiratory specimens during the acute phase of infection. The lowest concentration  of SARS-CoV-2 viral copies this assay can detect is 138 copies/mL. A negative result does not preclude SARS-Cov-2 infection and should not be used as the sole basis for treatment or other patient management decisions. A negative result may occur with  improper specimen collection/handling, submission of specimen other than nasopharyngeal swab, presence of viral mutation(s) within the areas  targeted by this assay, and inadequate number of viral copies(<138 copies/mL). A negative result must be combined with clinical observations, patient history, and epidemiological information. The expected result is Negative.  Fact Sheet for Patients:  EntrepreneurPulse.com.au  Fact Sheet for Healthcare Providers:  IncredibleEmployment.be  This test is no t yet approved or cleared by the [place] FDA and  has been authorized for detection and/or diagnosis of SARS-CoV-2 by FDA under an Emergency Use Authorization (EUA). This EUA will remain  in effect (meaning this test can be used) for the duration of the COVID-19 declaration under Section 564(b)(1) of the Act, 21 U.S.C.section 360bbb-3(b)(1), unless the authorization is terminated  or revoked sooner.       Influenza A by PCR NEGATIVE NEGATIVE Final   Influenza B by PCR NEGATIVE NEGATIVE Final    Comment: (NOTE) The Xpert Xpress SARS-CoV-2/FLU/RSV plus assay is intended as an aid in the diagnosis of influenza from Nasopharyngeal swab specimens and should not be used as a sole basis for treatment. Nasal washings and aspirates are unacceptable for Xpert Xpress SARS-CoV-2/FLU/RSV testing.  Fact Sheet for Patients: EntrepreneurPulse.com.au  Fact Sheet for Healthcare Providers: IncredibleEmployment.be  This test is not yet approved or cleared by the [place] FDA and has been authorized for detection and/or diagnosis of SARS-CoV-2 by FDA under an Emergency Use Authorization (EUA). This EUA will remain in effect (meaning this test can be used) for the duration of the COVID-19 declaration under Section 564(b)(1) of the Act, 21 U.S.C. section 360bbb-3(b)(1), unless the authorization is terminated or revoked.  Performed at [facility], [address], [place], [place] [address]   Blood culture (routine x 2)     Status: None  (Preliminary result)   Collection Time: 09/19/20  2:52 AM   Specimen: BLOOD  Result Value Ref Range Status   Specimen Description   Final    BLOOD Blood Culture adequate volume Performed at [facility], [address], [place], [place] [address]    Special Requests   Final    RIGHT ANTECUBITAL Performed at [facility], [address] [place], [place] [address]    Culture   Final    NO GROWTH 4 DAYS Performed at [facility], [place] [address] [place], [place] [address]    Report Status PENDING  Incomplete  Blood culture (routine x 2)     Status: None (Preliminary result)   Collection Time: 09/19/20  3:07 AM   Specimen: BLOOD  Result Value Ref Range Status   Specimen Description   Final    BLOOD Blood Culture adequate volume Performed at [facility], [address] [place], [place] [address]    Special Requests   Final    BLOOD LEFT FOREARM Performed at [facility], [address] [place], [place] [address]    Culture   Final    NO GROWTH 4 DAYS Performed at [facility], [place] [address], [place],  [address]    Report Status PENDING  Incomplete    Procedures/Studies: CT ABDOMEN PELVIS W CONTRAST  Result Date: 09/19/2020 CLINICAL DATA:  Abdominal pain and  fever. Left groin pain with "cellulitis". EXAM: CT ABDOMEN AND PELVIS WITH CONTRAST TECHNIQUE: Multidetector CT imaging of the abdomen and pelvis was performed using the standard protocol following bolus administration of intravenous contrast. CONTRAST:  1105m OMNIPAQUE IOHEXOL 350 MG/ML SOLN COMPARISON:  09/16/2020 FINDINGS: Lower chest: Unremarkable. Hepatobiliary: The liver shows diffusely decreased attenuation suggesting fat deposition. Liver measures 20.8 cm craniocaudal length, enlarged. No suspicious focal abnormality within the liver parenchyma. There is no evidence for gallstones, gallbladder wall thickening, or  pericholecystic fluid. No intrahepatic or extrahepatic biliary dilation. Pancreas: No focal mass lesion. No dilatation of the main duct. No intraparenchymal cyst. No peripancreatic edema. Spleen: 17 cm craniocaudal length.  No focal mass lesion. Adrenals/Urinary Tract: No adrenal nodule or mass. Right kidney unremarkable. 8 mm nonobstructing stone noted lower pole left kidney. No evidence for hydroureter. The urinary bladder appears normal for the degree of distention. Stomach/Bowel: Stomach is unremarkable. No gastric wall thickening. No evidence of outlet obstruction. Duodenum is normally positioned as is the ligament of Treitz. No small bowel wall thickening. No small bowel dilatation. The terminal ileum is normal. The appendix is normal. No gross colonic mass. No colonic wall thickening. Vascular/Lymphatic: No abdominal aortic aneurysm. There is no gastrohepatic or hepatoduodenal ligament lymphadenopathy. No retroperitoneal or mesenteric lymphadenopathy. 13 mm short axis left external iliac node on 83/3 is minimally more prominent than on the prior study with upper normal increased number of lymph nodes in the groin regions, left-greater-than-right, similar to prior. Reproductive: Uterus surgically absent.  There is no adnexal mass. Other: No intraperitoneal free fluid. Musculoskeletal: Skin thickening and subcutaneous edema in the lower anterior abdominal wall similar to prior with diffuse body wall edema noted lower abdomen and pelvis. As before, cellulitis not excluded. No gas within the subcutaneous soft tissues of the abdomen/pelvis. Of note, the entire perineum is not been included on today's study. IMPRESSION: 1. 2. Skin thickening and subcutaneous edema in the lower anterior abdominal wall with diffuse body wall edema noted lower abdomen and pelvis. As before, cellulitis not excluded. No gas within the subcutaneous soft tissues of the abdomen/pelvis. Of note, the entire perineum is not included on  today's study. 3. Hepatomegaly with hepatic steatosis. 4. Splenomegaly. 5. Nonobstructing left renal stone. Electronically Signed   By: EMisty StanleyM.D.   On: 09/19/2020 05:21   CT ABDOMEN PELVIS W CONTRAST  Result Date: 09/16/2020 CLINICAL DATA:  Groin pain, concern for necrotizing fasciitis EXAM: CT ABDOMEN AND PELVIS WITH CONTRAST TECHNIQUE: Multidetector CT imaging of the abdomen and pelvis was performed using the standard protocol following bolus administration of intravenous contrast. CONTRAST:  855mOMNIPAQUE IOHEXOL 350 MG/ML SOLN COMPARISON:  CT abdomen/pelvis 11/12/2018 FINDINGS: Lower chest: The lung bases are clear. The imaged heart is unremarkable. Hepatobiliary: The liver is mildly enlarged, measuring proximally 18.5 cm at the mid clavicular line, not significantly changed. There are no focal lesions. The gallbladder is unremarkable. There is no biliary ductal dilatation. Pancreas: Unremarkable. Spleen: The spleen is enlarged measuring up to 17.0 cm in maximum dimension, not significantly changed. There are no focal lesions. Adrenals/Urinary Tract: The adrenals are unremarkable. There is a 9 mm left lower pole renal stone, increased in size since the prior study. There is mild cortical scarring in the right mid kidney, unchanged. There are no solid lesions. There is no hydronephrosis or hydroureter. The bladder is unremarkable. Stomach/Bowel: The stomach is unremarkable. There is no evidence of bowel obstruction. There is no abnormal bowel wall thickening or inflammatory change.  The appendix is normal. Vascular/Lymphatic: There is mild calcified atherosclerotic plaque in the nonaneurysmal abdominal aorta. The main portal and splenic veins appear patent. There is a 1.6 cm portacaval lymph node, unchanged since 2020, nonspecific. There is no new or progressive lymphadenopathy in the abdomen or pelvis. Mild haziness in the mesenteric root is nonspecific, not significantly changed since 2020.  Reproductive: Status post hysterectomy. No adnexal masses. Other: There is fat stranding throughout the lower abdominal wall with overlying skin thickening. There is no soft tissue gas. There is no organized or drainable collection. There is no ascites or free air. There is diffuse body wall edema. Musculoskeletal: The patient is status post L4-L5LIF. There is no acute osseous abnormality. IMPRESSION: 1. Stranding in the subcutaneous fat of the lower anterior abdominal wall suggestive of cellulitis; correlate with physical exam. There is no organized fluid collection, and no soft tissue gas to suggest necrotizing fasciitis. 2. Diffuse edema throughout the remainder of the body wall. 3. 9 mm nonobstructing left lower pole renal stone. 4. Hepatosplenomegaly, unchanged. Electronically Signed   By: Valetta Mole M.D.   On: 09/16/2020 13:13   MR HIP LEFT WO CONTRAST  Result Date: 09/20/2020 CLINICAL DATA:  Left hip and groin pain EXAM: MR OF THE LEFT HIP WITHOUT CONTRAST TECHNIQUE: Multiplanar, multisequence MR imaging was performed. No intravenous contrast was administered. COMPARISON:  CT pelvis 09/19/2020 FINDINGS: Body habitus reduces diagnostic sensitivity and specificity. Despite efforts by the technologist and patient, motion artifact is present on today's exam and could not be eliminated. This reduces exam sensitivity and specificity. The patient was unable to complete today's examination due to discomfort and difficulty laying supine. Bones: Postoperative findings in the lower lumbar spine. Spurring of the acetabula and femoral heads. Articular cartilage and labrum Articular cartilage: Mild degenerative chondral thinning in both hips. Labrum: Not well assessed today due to technical factors including motion artifact. Joint or bursal effusion Joint effusion:  Absent Bursae: Trace bilateral trochanteric bursitis Muscles and tendons Muscles and tendons: Prominently accentuated T2 signal is present within and  along the left adductor longus muscle for example on image 40 series 7 and image 24 series 6. On the T1 weighted images I do not see a substantial degree of accentuated T1 signal in the corresponding region to necessarily indicate intramuscular hematoma. There is also some confluent edema tracking along adjacent fascia planes and along the left adductor aponeurosis for example on images 27 through 28 of series 7 and on image 24 series 6. Other findings Miscellaneous: Subcutaneous edema in the anterior pelvic subcutaneous adipose tissues and into the pubis, panniculitis is not excluded. Left external iliac node 1.5 cm in short axis on image 15 series 7, stable from 09/19/2020. Left inguinal lymph node 1.4 cm in short axis on image 35 series 7, likewise stable. Other upper normal size and mildly enlarged inguinal and external iliac lymph nodes are probably reactive. Trace free pelvic fluid.  Uterus surgically absent. IMPRESSION: 1. Extensive edema in the left adductor longus muscle in the medial upper thigh could reflect myositis or muscle tear. Intramuscular hematoma is likewise a possibility although I do not demonstrate substantially increased T1 signal within the muscle to further suggest hematoma. Associated edema tracks up along the adductor longus aponeurosis towards the pubis. 2. Subcutaneous edema in the anterior pelvic wall subcutaneous tissues tracking down to the pubis, panniculitis is not excluded. 3. Likely reactive pelvic and inguinal lymph nodes bilaterally. 4. No hip effusion to suggest septic hip. Trace fluid in  the trochanteric bursa bilaterally. 5. Trace free pelvic fluid. 6. A variety of technical factors adversely affect sensitivity of today's exam, including body habitus, motion artifact, and the patient terminating the exam prior to completion due to discomfort. Electronically Signed   By: Van Clines M.D.   On: 09/20/2020 15:37   US Venous Img Lower Unilateral Left  Result Date:  09/16/2020 CLINICAL DATA:  Left lower extremity pain.  Evaluate for DVT. EXAM: LEFT LOWER EXTREMITY VENOUS DOPPLER ULTRASOUND TECHNIQUE: Gray-scale sonography with graded compression, as well as color Doppler and duplex ultrasound were performed to evaluate the lower extremity deep venous systems from the level of the common femoral vein and including the common femoral, femoral, profunda femoral, popliteal and calf veins including the posterior tibial, peroneal and gastrocnemius veins when visible. The superficial great saphenous vein was also interrogated. Spectral Doppler was utilized to evaluate flow at rest and with distal augmentation maneuvers in the common femoral, femoral and popliteal veins. COMPARISON:  Left lower extremity venous Doppler ultrasound-09/10/2016 (negative). FINDINGS: Examination is degraded due to patient body habitus and poor sonographic window. Contralateral Common Femoral Vein: Respiratory phasicity is normal and symmetric with the symptomatic side. No evidence of thrombus. Normal compressibility. Common Femoral Vein: No evidence of thrombus. Normal compressibility, respiratory phasicity and response to augmentation. Saphenofemoral Junction: No evidence of thrombus. Normal compressibility and flow on color Doppler imaging. Profunda Femoral Vein: No evidence of thrombus. Normal compressibility and flow on color Doppler imaging. Femoral Vein: No evidence of thrombus. Normal compressibility, respiratory phasicity and response to augmentation. Popliteal Vein: No evidence of thrombus. Normal compressibility, respiratory phasicity and response to augmentation. Calf Veins: Suboptimally visualized Superficial Great Saphenous Vein: No evidence of thrombus. Normal compressibility. Other Findings:  None. IMPRESSION: No evidence of DVT within the left lower extremity on this body habitus degraded examination. Electronically Signed   By: Sandi Mariscal M.D.   On: 09/16/2020 16:56   DG Chest Portable 1  View  Result Date: 09/19/2020 CLINICAL DATA:  Fever EXAM: PORTABLE CHEST 1 VIEW COMPARISON:  04/09/2019 FINDINGS: Lungs are clear.  No pleural effusion or pneumothorax. The heart is normal in size. IMPRESSION: No evidence of acute cardiopulmonary disease. Electronically Signed   By: Julian Hy M.D.   On: 09/19/2020 02:50     Time coordinating discharge: Over 70 minutes    Dwyane Dee, MD  [facility] 09/23/2020, 3:51 PM

## 2020-09-23 NOTE — Progress Notes (Signed)
Pt. Discharge via wheelchair accompanied by daughters, Pt. Is alert and oriented , no distress. Discharge instruction and education discussed, patient verbalized understanding. All personal belongings are with the family. Home meds delivered by pharmacy.

## 2020-09-23 NOTE — Discharge Instructions (Signed)
Take 1/2 tablet of your Zoloft while taking Zyvox. Then you can resume normal dose.

## 2020-09-24 LAB — CULTURE, BLOOD (ROUTINE X 2)
Culture: NO GROWTH
Culture: NO GROWTH
Specimen Description: ADEQUATE
Specimen Description: ADEQUATE

## 2020-09-29 ENCOUNTER — Ambulatory Visit (AMBULATORY_SURGERY_CENTER): Payer: Self-pay | Admitting: *Deleted

## 2020-09-29 ENCOUNTER — Other Ambulatory Visit (HOSPITAL_COMMUNITY): Payer: Self-pay

## 2020-09-29 ENCOUNTER — Other Ambulatory Visit: Payer: Self-pay

## 2020-09-29 VITALS — Ht 67.0 in | Wt >= 6400 oz

## 2020-09-29 DIAGNOSIS — Z800 Family history of malignant neoplasm of digestive organs: Secondary | ICD-10-CM

## 2020-09-29 DIAGNOSIS — Z86010 Personal history of colonic polyps: Secondary | ICD-10-CM

## 2020-09-29 MED ORDER — ONDANSETRON HCL 4 MG PO TABS: 4.0000 mg | ORAL_TABLET | ORAL | 0 refills | Status: AC

## 2020-09-29 MED ORDER — NA SULFATE-K SULFATE-MG SULF 17.5-3.13-1.6 GM/177ML PO SOLN: 1.0000 | ORAL | 0 refills | Status: DC

## 2020-09-29 NOTE — Progress Notes (Signed)
Patient's pre-visit was done today over the phone with the patient due to COVID-19 pandemic. Name,DOB and address verified. Insurance verified. Patient denies any allergies to Eggs and Soy. Patient denies any problems with anesthesia/sedation. Patient is not taking any diet pills. Pt aware to stop  blood thinner. No home Oxygen. Prep instructions sent to patient's Mychart per pt's request-pt is aware. Patient understands to call us back with any questions or concerns. Patient is aware of our care-partner policy and 0000000 safety protocol.   EMMI education assigned to the patient for the procedure, sent to [place].   The patient is COVID-19 vaccinated.

## 2020-10-03 ENCOUNTER — Other Ambulatory Visit: Payer: Self-pay

## 2020-10-06 ENCOUNTER — Encounter: Payer: Self-pay | Admitting: Family Medicine

## 2020-10-07 ENCOUNTER — Ambulatory Visit (INDEPENDENT_AMBULATORY_CARE_PROVIDER_SITE_OTHER): Payer: Medicare Other | Admitting: Infectious Disease

## 2020-10-07 ENCOUNTER — Encounter: Payer: Self-pay | Admitting: Infectious Disease

## 2020-10-07 ENCOUNTER — Other Ambulatory Visit: Payer: Self-pay

## 2020-10-07 VITALS — BP 148/74 | HR 84 | Temp 98.8°F

## 2020-10-07 DIAGNOSIS — E1142 Type 2 diabetes mellitus with diabetic polyneuropathy: Secondary | ICD-10-CM

## 2020-10-07 DIAGNOSIS — L03116 Cellulitis of left lower limb: Secondary | ICD-10-CM | POA: Diagnosis not present

## 2020-10-07 DIAGNOSIS — E0842 Diabetes mellitus due to underlying condition with diabetic polyneuropathy: Secondary | ICD-10-CM

## 2020-10-07 DIAGNOSIS — J029 Acute pharyngitis, unspecified: Secondary | ICD-10-CM | POA: Diagnosis not present

## 2020-10-07 DIAGNOSIS — E1169 Type 2 diabetes mellitus with other specified complication: Secondary | ICD-10-CM | POA: Diagnosis not present

## 2020-10-07 DIAGNOSIS — T148XXA Other injury of unspecified body region, initial encounter: Secondary | ICD-10-CM | POA: Insufficient documentation

## 2020-10-07 DIAGNOSIS — I890 Lymphedema, not elsewhere classified: Secondary | ICD-10-CM

## 2020-10-07 DIAGNOSIS — B3731 Acute candidiasis of vulva and vagina: Secondary | ICD-10-CM

## 2020-10-07 DIAGNOSIS — B373 Candidiasis of vulva and vagina: Secondary | ICD-10-CM | POA: Insufficient documentation

## 2020-10-07 DIAGNOSIS — Z794 Long term (current) use of insulin: Secondary | ICD-10-CM

## 2020-10-07 DIAGNOSIS — R208 Other disturbances of skin sensation: Secondary | ICD-10-CM | POA: Diagnosis not present

## 2020-10-07 DIAGNOSIS — R1032 Left lower quadrant pain: Secondary | ICD-10-CM | POA: Diagnosis not present

## 2020-10-07 DIAGNOSIS — E669 Obesity, unspecified: Secondary | ICD-10-CM

## 2020-10-07 HISTORY — DX: Other injury of unspecified body region, initial encounter: T14.8XXA

## 2020-10-07 HISTORY — DX: Other disturbances of skin sensation: R20.8

## 2020-10-07 HISTORY — DX: Acute pharyngitis, unspecified: J02.9

## 2020-10-07 HISTORY — DX: Candidiasis of vulva and vagina: B37.3

## 2020-10-07 HISTORY — DX: Acute candidiasis of vulva and vagina: B37.31

## 2020-10-07 MED ORDER — FLUCONAZOLE 150 MG PO TABS: 150.0000 mg | ORAL_TABLET | Freq: Every day | ORAL | 2 refills | Status: DC

## 2020-10-07 MED ORDER — CEFADROXIL 500 MG PO CAPS
500.0000 mg | ORAL_CAPSULE | Freq: Two times a day (BID) | ORAL | 6 refills | Status: DC
Start: 2020-10-07 — End: 2022-12-28

## 2020-10-07 NOTE — Telephone Encounter (Signed)
Call in Diflucan 150 mg to take one daily, #10 with 2 rf

## 2020-10-07 NOTE — Progress Notes (Signed)
Subjective:   Chief complaint follow-up for cellulitis and possible myositis    Patient ID: Cheyenne Guzman, female    DOB: 10/18/1965, 55 y.o.   MRN: [id number]  HPI  55 year old Caucasian lady with a history of morbid obesity diabetes mellitus chronic lymphedema, breast Guzman status post lumpectomy and axillary lymph node dissection, diabetes mellitus who was admitted to the hospital with left-sided groin pain hip pain fevers nausea and vomiting.  Recently she did not have much evidence of cellulitis later manifested erythema on her lower extremity highly consistent with this.  We were worried about septic arthritis of her hip but MRI did not show evidence of infection in the joint that raise question of myositis versus inflammatory changes around the torn gluteus muscle.  We changed her antibiotics to Zyvox and she completed 2 weeks of therapy.  She has developed some sore throat and also cracking of her lips and headaches which she attributed to the Zyvox.  Mostly symptoms of abated with cessation of Zyvox but she does have what sounds like candidal vaginitis.  She also still has residual sore throat    Past Medical History:  Diagnosis Date   Anxiety    per pt. - mild    Arthritis    back & ankles    Asthma    seasonal    Breast Guzman (HCC)    Burning sensation of mouth 10/07/2020   Guzman ([place])    breast, sees Dr. Jana Hakim , Left - 11/2007   Candida vaginitis 10/07/2020   Cellulitis 2020   severe - Left leg    Complication of anesthesia 2013   severe muscle spasms-sore-no doc   Depression    Diabetes mellitus    sees Dr. Chalmers Cater, diagnosed 2003   HA (headache)    Hearing loss    Went to [facility],Dr Laing,both ears   Hyperlipidemia    Kidney infection    - hosp. [facility]- septic- 10/2009   Low back pain    Muscle tear 10/07/2020   Peripheral neuropathy    R sided numbness- face & jaw   Pulmonary embolism ([place]) 2020   Sepsis ([place]) 2020   Sleep  apnea 9/15   severe-just started cpap-doing well   Sore throat 10/07/2020   TIA (transient ischemic attack)    in 2007,2008, and 2011   Tinnitus of right ear    Urosepsis 08-2009   with Klebsiella    Past Surgical History:  Procedure Laterality Date   ABDOMINAL HYSTERECTOMY  08/2004   BREAST BIOPSY  06/22/2011   Procedure: BREAST BIOPSY;  Surgeon: Stark Klein, MD;  Location: [place];  Service: General;  Laterality: Right;   BREAST LUMPECTOMY     BREAST SURGERY  11/2007   left ductal carcinoma in situ   CARPAL TUNNEL RELEASE Right 10/13/2013   Procedure: RIGHT CARPAL TUNNEL RELEASE;  Surgeon: Hessie Dibble, MD;  Location: [place];  Service: Orthopedics;  Laterality: Right;   CARPAL TUNNEL RELEASE Left 11/24/2013   Procedure: LEFT CARPAL TUNNEL RELEASE WITH CYST EXCISION ;  Surgeon: Hessie Dibble, MD;  Location: [place];  Service: Orthopedics;  Laterality: Left;   EAR CYST EXCISION Left 11/24/2013   Procedure: CYST REMOVAL;  Surgeon: Hessie Dibble, MD;  Location: [place];  Service: Orthopedics;  Laterality: Left;   FOOT SURGERY  2000   plantar fasciitis-left   LUMBAR DISC SURGERY  03-29-14   Fusion, revision 04-27-14  L4-5 Cauda Equina with graft   NERVE GRAFT  06-01-08   cadaver graft to right inferior alveolar nerve  [place]    Family History  Problem Relation Age of Onset   Guzman Mother        breast   Guzman Father        lung   Diabetes Father    Colon Guzman Sister    Guzman Sister 68       colon   Diabetes Sister    Guzman Maternal Aunt        breast - both   Anesthesia problems Neg Hx       Social History   Socioeconomic History   Marital status: Married    Spouse name: Cheyenne Guzman   Number of children: 2   Years of education: 14   Highest education level: Not on file  Occupational History    Comment: [employer]  Tobacco Use   Smoking status: Former    Types: Cigarettes    Quit date:  02/19/1992    Years since quitting: 28.6   Smokeless tobacco: Never  Vaping Use   Vaping Use: Never used  Substance and Sexual Activity   Alcohol use: Not Currently    Comment: rare   Drug use: No   Sexual activity: Not on file  Other Topics Concern   Not on file  Social History Narrative   Patient lives at home with her husband Cheyenne Guzman) and two daughters.    Patient works for [employer] full time patient is on medical leave at this time.   Education two years of college.   Right handed.   Caffeine two times per day.   Social Determinants of Health   Financial Resource Strain: High Risk   Difficulty of Paying Living Expenses: Hard  Food Insecurity: Food Insecurity Present   Worried About Running Out of Food in the Last Year: Sometimes true   Ran Out of Food in the Last Year: Sometimes true  Transportation Needs: No Transportation Needs   Lack of Transportation (Medical): No   Lack of Transportation (Non-Medical): No  Physical Activity: Inactive   Days of Exercise per Week: 0 days   Minutes of Exercise per Session: 0 min  Stress: Stress Concern Present   Feeling of Stress : To some extent  Social Connections: Moderately Integrated   Frequency of Communication with Friends and Family: More than three times a week   Frequency of Social Gatherings with Friends and Family: More than three times a week   Attends Religious Services: More than 4 times per year   Active Member of [employer] or Organizations: No   Attends Archivist Meetings: Never   Marital Status: Married    Allergies  Allergen Reactions   Vicodin [Hydrocodone-Acetaminophen] Nausea And Vomiting     Current Outpatient Medications:    albuterol (PROVENTIL) (2.5 MG/3ML) 0.083% nebulizer solution, 1 VIAL IN NEBULIZER EVERY 4 HOURS AS NEEDED FOR WHEEZING, Disp: 75 mL, Rfl: 1   albuterol (VENTOLIN HFA) 108 (90 Base) MCG/ACT inhaler, Inhale 2 puffs into the lungs every 4 (four) hours as needed for wheezing  or shortness of breath., Disp: 18 g, Rfl: 5   atorvastatin (LIPITOR) 10 MG tablet, Take 1 tablet (10 mg total) by mouth daily. (Patient taking differently: Take 10 mg by mouth every morning.), Disp: 90 tablet, Rfl: 1   Blood Glucose Monitoring Suppl (CONTOUR NEXT MONITOR) w/Device KIT, 1 each by Does not apply route  2 (two) times daily. To check blood sugars twice a day., Disp: 1 kit, Rfl: 0   Buprenorphine 15 MCG/HR PTWK, Place 1 patch onto the skin every Monday., Disp: , Rfl: 0   cefadroxil (DURICEF) 500 MG capsule, Take 1 capsule (500 mg total) by mouth 2 (two) times daily. Take at first signs of recurrent cellulitis, Disp: 120 capsule, Rfl: 6   Cholecalciferol (VITAMIN D3) 50 MCG (2000 UT) TABS, Take 2,000 Units by mouth every morning., Disp: , Rfl:    fluconazole (DIFLUCAN) 150 MG tablet, Take 1 tablet (150 mg total) by mouth daily., Disp: 10 tablet, Rfl: 2   furosemide (LASIX) 40 MG tablet, TAKE 1 TABLET BY MOUTH TWICE A DAY (Patient taking differently: Take 40 mg by mouth See admin instructions. Take one tablet (40 mg) by mouth daily when not going out - approximately 5 times week), Disp: 180 tablet, Rfl: 0   gabapentin (NEURONTIN) 100 MG capsule, Take 100 mg by mouth 2 (two) times daily. Take with a 300 mg capsule for a total dose of 400 mg twice daily, Disp: , Rfl:    gabapentin (NEURONTIN) 300 MG capsule, Take 300 mg by mouth 2 (two) times daily. Take with a 100 mg capsule for a total dose of 400 mg twice daily, Disp: , Rfl:    glucose blood (CONTOUR NEXT TEST) test strip, Use to test blood sugar 2 times daily, Disp: 100 each, Rfl: 12   glucose blood test strip, Use as instructed, Disp: 100 each, Rfl: 0   insulin lispro protamine-lispro (HUMALOG MIX 75/25) (75-25) 100 UNIT/ML SUSP injection, INJECT 110 UNITS INTO THE SKIN 2 TIMES DAILY WITH A MEAL. (Patient taking differently: Inject 120 Units into the skin 2 (two) times daily with a meal.), Disp: 70 mL, Rfl: 0   Insulin Syringe-Needle U-100  (INSULIN SYRINGE 1CC/31GX5/16") 31G X 5/16" 1 ML MISC, 2x daily, Disp: 150 each, Rfl: 6   levothyroxine (SYNTHROID) 75 MCG tablet, TAKE 1 TABLET BY MOUTH EVERY DAY (Patient taking differently: Take 75 mcg by mouth daily before breakfast.), Disp: 90 tablet, Rfl: 3   lidocaine (XYLOCAINE) 5 % ointment, Apply 1 application topically 2 (two) times daily as needed (pain)., Disp: , Rfl:    metFORMIN (GLUCOPHAGE) 1000 MG tablet, TAKE 1 TABLET BY MOUTH EVERY DAY WITH BREAKFAST (Patient taking differently: Take 1,000 mg by mouth every morning.), Disp: 90 tablet, Rfl: 0   methocarbamol (ROBAXIN) 750 MG tablet, Take 1 tablet (750 mg total) by mouth 2 (two) times daily. (Patient taking differently: Take 750 mg by mouth every morning.), Disp: 60 tablet, Rfl: 5   Multiple Vitamin (MULTIVITAMIN WITH MINERALS) TABS tablet, Take 1 tablet by mouth daily., Disp: , Rfl:    Multiple Vitamins-Minerals (AIRBORNE GUMMIES) CHEW, Chew 3 tablets by mouth every morning., Disp: , Rfl:    Na Sulfate-K Sulfate-Mg Sulf 17.5-3.13-1.6 GM/177ML SOLN, Take 1 kit by mouth as directed., Disp: 354 mL, Rfl: 0   ondansetron (ZOFRAN) 4 MG tablet, Take 1 tablet (4 mg total) by mouth as directed. Take one Zofran pill 30-60 minutes before each colonoscopy prep dose, Disp: 2 tablet, Rfl: 0   pramipexole (MIRAPEX) 0.5 MG tablet, TAKE 1 TABLET BY MOUTH EVERYDAY AT BEDTIME (Patient taking differently: Take 0.5 mg by mouth at bedtime. For restless legs), Disp: 90 tablet, Rfl: 0   sertraline (ZOLOFT) 100 MG tablet, TAKE 2 TABLETS (200 MG TOTAL) BY MOUTH AT BEDTIME. (Patient taking differently: Take 200 mg by mouth every morning.), Disp: 180 tablet,  Rfl: 3   XARELTO 20 MG TABS tablet, TAKE 1 TABLET BY MOUTH EVERY DAY (Patient taking differently: Take 20 mg by mouth every morning.), Disp: 30 tablet, Rfl: 11   Review of Systems  Constitutional:  Negative for activity change, appetite change, chills, diaphoresis, fatigue, fever and unexpected weight  change.  HENT:  Negative for congestion, rhinorrhea, sinus pressure, sneezing, sore throat and trouble swallowing.   Eyes:  Negative for photophobia and visual disturbance.  Respiratory:  Negative for cough, chest tightness, shortness of breath, wheezing and stridor.   Cardiovascular:  Negative for chest pain, palpitations and leg swelling.  Gastrointestinal:  Negative for abdominal distention, abdominal pain, anal bleeding, blood in stool, constipation, diarrhea, nausea and vomiting.  Genitourinary:  Positive for vaginal pain. Negative for difficulty urinating, dysuria, flank pain and hematuria.  Musculoskeletal:  Negative for arthralgias, back pain, gait problem, joint swelling and myalgias.  Skin:  Negative for color change, pallor, rash and wound.  Neurological:  Negative for dizziness, tremors, weakness and light-headedness.  Hematological:  Negative for adenopathy. Does not bruise/bleed easily.  Psychiatric/Behavioral:  Negative for agitation, behavioral problems, confusion, decreased concentration, dysphoric mood and sleep disturbance.       Objective:   Physical Exam Constitutional:      General: She is not in acute distress.    Appearance: Normal appearance. She is well-developed. She is obese. She is not ill-appearing or diaphoretic.  HENT:     Head: Normocephalic and atraumatic.     Right Ear: Hearing and external ear normal.     Left Ear: Hearing and external ear normal.     Nose: No nasal deformity or rhinorrhea.     Mouth/Throat:     Lips: Pink.     Mouth: Mucous membranes are moist.     Pharynx: Uvula midline.  Eyes:     General: No scleral icterus.    Conjunctiva/sclera: Conjunctivae normal.     Right eye: Right conjunctiva is not injected.     Left eye: Left conjunctiva is not injected.     Pupils: Pupils are equal, round, and reactive to light.  Neck:     Vascular: No JVD.  Cardiovascular:     Rate and Rhythm: Normal rate and regular rhythm.     Heart sounds:  Normal heart sounds, S1 normal and S2 normal. No murmur heard.   No friction rub.  Abdominal:     General: Bowel sounds are normal. There is no distension.     Palpations: Abdomen is soft.     Tenderness: There is no abdominal tenderness.  Musculoskeletal:        General: Normal range of motion.     Right shoulder: Normal.     Left shoulder: Normal.     Cervical back: Normal range of motion and neck supple.     Right hip: Normal.     Left hip: Normal.     Right knee: Normal.     Left knee: Normal.  Lymphadenopathy:     Head:     Right side of head: No submandibular, preauricular or posterior auricular adenopathy.     Left side of head: No submandibular, preauricular or posterior auricular adenopathy.     Cervical: No cervical adenopathy.     Right cervical: No superficial or deep cervical adenopathy.    Left cervical: No superficial or deep cervical adenopathy.  Skin:    General: Skin is warm and dry.     Findings: No abrasion or ecchymosis.  Nails: There is no clubbing.  Neurological:     General: No focal deficit present.     Mental Status: She is alert and oriented to person, place, and time. Mental status is at baseline.     Sensory: No sensory deficit.     Coordination: Coordination normal.     Gait: Gait normal.  Psychiatric:        Attention and Perception: She is attentive.        Speech: Speech normal.        Behavior: Behavior normal. Behavior is cooperative.        Thought Content: Thought content normal.        Judgment: Judgment normal.    Left lower extremity 10/07/2020:         Assessment & Plan:    Recurrent cellulitis of left lower extremity:  This is resolved.  I have given her prescription for cefadroxil to be taking 2 tablets twice daily x10 days at the first sign of cellulitis in her left lower extremity.  Left groin pain: This continues to improve though still has some pain here likely due to gluteus tear potential hematoma.  Did not  think she has myositis   Candidal vaginitis: She is on fluconazole per primary.  DM: A1c in August 2022 reviewed and was 10.6

## 2020-10-10 ENCOUNTER — Ambulatory Visit (HOSPITAL_COMMUNITY)
Admission: RE | Admit: 2020-10-10 | Discharge: 2020-10-10 | Disposition: A | Discharge status: Home / Self Care | Payer: Medicare Other | Attending: Gastroenterology | Admitting: Gastroenterology

## 2020-10-10 ENCOUNTER — Other Ambulatory Visit: Payer: Self-pay

## 2020-10-10 ENCOUNTER — Encounter (HOSPITAL_COMMUNITY)
Admission: RE | Disposition: A | Discharge status: Home / Self Care | Payer: Self-pay | Source: Home / Self Care | Attending: Gastroenterology

## 2020-10-10 ENCOUNTER — Ambulatory Visit (HOSPITAL_COMMUNITY): Payer: Medicare Other | Admitting: Certified Registered Nurse Anesthetist

## 2020-10-10 ENCOUNTER — Encounter (HOSPITAL_COMMUNITY): Payer: Self-pay | Admitting: Gastroenterology

## 2020-10-10 DIAGNOSIS — Z1211 Encounter for screening for malignant neoplasm of colon: Secondary | ICD-10-CM | POA: Diagnosis not present

## 2020-10-10 DIAGNOSIS — Z853 Personal history of malignant neoplasm of breast: Secondary | ICD-10-CM | POA: Insufficient documentation

## 2020-10-10 DIAGNOSIS — Z800 Family history of malignant neoplasm of digestive organs: Secondary | ICD-10-CM | POA: Diagnosis not present

## 2020-10-10 DIAGNOSIS — D120 Benign neoplasm of cecum: Secondary | ICD-10-CM | POA: Diagnosis not present

## 2020-10-10 DIAGNOSIS — Z7984 Long term (current) use of oral hypoglycemic drugs: Secondary | ICD-10-CM | POA: Insufficient documentation

## 2020-10-10 DIAGNOSIS — Z886 Allergy status to analgesic agent status: Secondary | ICD-10-CM | POA: Diagnosis not present

## 2020-10-10 DIAGNOSIS — Z860100 Personal history of colon polyps, unspecified: Secondary | ICD-10-CM

## 2020-10-10 DIAGNOSIS — D122 Benign neoplasm of ascending colon: Secondary | ICD-10-CM

## 2020-10-10 DIAGNOSIS — Z87891 Personal history of nicotine dependence: Secondary | ICD-10-CM | POA: Insufficient documentation

## 2020-10-10 DIAGNOSIS — D127 Benign neoplasm of rectosigmoid junction: Secondary | ICD-10-CM

## 2020-10-10 DIAGNOSIS — K635 Polyp of colon: Secondary | ICD-10-CM | POA: Insufficient documentation

## 2020-10-10 DIAGNOSIS — Z86711 Personal history of pulmonary embolism: Secondary | ICD-10-CM | POA: Diagnosis not present

## 2020-10-10 DIAGNOSIS — Z79899 Other long term (current) drug therapy: Secondary | ICD-10-CM | POA: Diagnosis not present

## 2020-10-10 DIAGNOSIS — Z885 Allergy status to narcotic agent status: Secondary | ICD-10-CM | POA: Diagnosis not present

## 2020-10-10 DIAGNOSIS — D126 Benign neoplasm of colon, unspecified: Secondary | ICD-10-CM

## 2020-10-10 DIAGNOSIS — K648 Other hemorrhoids: Secondary | ICD-10-CM | POA: Diagnosis not present

## 2020-10-10 DIAGNOSIS — Z794 Long term (current) use of insulin: Secondary | ICD-10-CM | POA: Diagnosis not present

## 2020-10-10 DIAGNOSIS — Z79890 Hormone replacement therapy (postmenopausal): Secondary | ICD-10-CM | POA: Diagnosis not present

## 2020-10-10 DIAGNOSIS — Z8719 Personal history of other diseases of the digestive system: Secondary | ICD-10-CM | POA: Insufficient documentation

## 2020-10-10 DIAGNOSIS — E039 Hypothyroidism, unspecified: Secondary | ICD-10-CM | POA: Diagnosis not present

## 2020-10-10 HISTORY — PX: COLONOSCOPY WITH PROPOFOL: SHX5780

## 2020-10-10 HISTORY — PX: POLYPECTOMY: SHX5525

## 2020-10-10 LAB — GLUCOSE, CAPILLARY
Glucose-Capillary: 244 mg/dL — ABNORMAL HIGH (ref 70–99)
Glucose-Capillary: 225 mg/dL — ABNORMAL HIGH (ref 70–99)

## 2020-10-10 SURGERY — COLONOSCOPY WITH PROPOFOL
Anesthesia: Monitor Anesthesia Care

## 2020-10-10 MED ORDER — PROPOFOL 1000 MG/100ML IV EMUL
INTRAVENOUS | Status: AC
  Filled 2020-10-10: qty 100

## 2020-10-10 MED ORDER — SODIUM CHLORIDE 0.9 % IV SOLN: INTRAVENOUS | Status: DC

## 2020-10-10 MED ORDER — ONDANSETRON HCL 4 MG/2ML IJ SOLN
INTRAMUSCULAR | Status: DC | PRN
  Administered 2020-10-10: 4 mg via INTRAVENOUS

## 2020-10-10 MED ORDER — LACTATED RINGERS IV SOLN
INTRAVENOUS | Status: DC
  Administered 2020-10-10: 1000 mL via INTRAVENOUS

## 2020-10-10 MED ORDER — PROPOFOL 500 MG/50ML IV EMUL
INTRAVENOUS | Status: DC | PRN
  Administered 2020-10-10: 75 ug/kg/min via INTRAVENOUS

## 2020-10-10 MED ORDER — PROPOFOL 10 MG/ML IV BOLUS
INTRAVENOUS | Status: DC | PRN
  Administered 2020-10-10: 20 mg via INTRAVENOUS

## 2020-10-10 SURGICAL SUPPLY — 21 items

## 2020-10-10 NOTE — Discharge Instructions (Signed)

## 2020-10-10 NOTE — Op Note (Signed)
[facility] Patient Name: Cheyenne Guzman Procedure Date: 10/10/2020 MRN: [id number] Attending MD: Carlota Raspberry. Havery Moros , MD Date of Birth: August 29, 1965 CSN: [id number] Age: 55 Admit Type: Outpatient Procedure:                Colonoscopy Indications:              High risk colon cancer surveillance: Personal                            history of colonic polyps - polyps removed in 2010,                            sister with colon cancer age 66s - asymptomatic Providers:                Carlota Raspberry. Havery Moros, MD, Dulcy Fanny, Tyrone Apple, Technician, Luan Moore, Technician,                            Virgia Land, CRNA Referring MD:              Medicines:                Monitored Anesthesia Care Complications:            No immediate complications. Estimated blood loss:                            Minimal. Estimated Blood Loss:     Estimated blood loss was minimal. Procedure:                Pre-Anesthesia Assessment:                           - Prior to the procedure, a History and Physical                            was performed, and patient medications and                            allergies were reviewed. The patient's tolerance of                            previous anesthesia was also reviewed. The risks                            and benefits of the procedure and the sedation                            options and risks were discussed with the patient.                            All questions were answered, and informed consent  was obtained. Prior Anticoagulants: The patient has                            taken Xarelto (rivaroxaban), last dose was 3 days                            prior to procedure. ASA Grade Assessment: III - A                            patient with severe systemic disease. After                            reviewing the risks and benefits, the patient was                             deemed in satisfactory condition to undergo the                            procedure.                           After obtaining informed consent, the colonoscope                            was passed under direct vision. Throughout the                            procedure, the patient's blood pressure, pulse, and                            oxygen saturations were monitored continuously. The                            CF-HQ190L ([id number]) Olympus colonoscope was                            introduced through the anus and advanced to the the                            cecum, identified by appendiceal orifice and                            ileocecal valve. The colonoscopy was performed                            without difficulty. The patient tolerated the                            procedure well. The quality of the bowel                            preparation was good. The ileocecal valve,  appendiceal orifice, and rectum were photographed. Scope In: 10:01:54 AM Scope Out: 10:26:42 AM Scope Withdrawal Time: 0 hours 20 minutes 3 seconds  Total Procedure Duration: 0 hours 24 minutes 48 seconds  Findings:      The perianal and digital rectal examinations were normal.      A 3 mm polyp was found in the cecum. The polyp was sessile. The polyp       was removed with a cold snare. Resection and retrieval were complete.      Two sessile polyps were found in the ascending colon. The polyps were 3       to 4 mm in size. These polyps were removed with a cold snare. Resection       and retrieval were complete.      Two sessile polyps were found in the recto-sigmoid colon. The polyps       were 3 mm in size. These polyps were removed with a cold snare.       Resection and retrieval were complete.      Internal hemorrhoids were found during retroflexion.      The exam was otherwise without abnormality. Impression:               - One 3 mm polyp in the cecum, removed with a  cold                            snare. Resected and retrieved.                           - Two 3 to 4 mm polyps in the ascending colon,                            removed with a cold snare. Resected and retrieved.                           - Two 3 mm polyps at the recto-sigmoid colon,                            removed with a cold snare. Resected and retrieved.                           - Internal hemorrhoids.                           - The examination was otherwise normal. Moderate Sedation:      No moderate sedation, case performed with MAC Recommendation:           - Patient has a contact number available for                            emergencies. The signs and symptoms of potential                            delayed complications were discussed with the                            patient. Return to normal activities tomorrow.  Written discharge instructions were provided to the                            patient.                           - Resume previous diet.                           - Continue present medications.                           - Resume Xarelto tomorrow                           - Await pathology results. Procedure Code(s):        --- Professional ---                           [phone], Colonoscopy, flexible; with removal of                            tumor(s), polyp(s), or other lesion(s) by snare                            technique Diagnosis Code(s):        --- Professional ---                           K63.5, Polyp of colon                           Z86.010, Personal history of colonic polyps                           K64.8, Other hemorrhoids CPT copyright 2019 American Medical Association. All rights reserved. The codes documented in this report are preliminary and upon coder review may  be revised to meet current compliance requirements. Remo Lipps P. Elvi Leventhal, MD 10/10/2020 10:42:00 AM This report has been signed  electronically. Number of Addenda: 0

## 2020-10-10 NOTE — Anesthesia Preprocedure Evaluation (Signed)
Anesthesia Evaluation  Patient identified by MRN, date of birth, ID band Patient awake    Reviewed: Allergy & Precautions, NPO status , Patient's Chart, lab work & pertinent test results  Airway Mallampati: II  TM Distance: >3 FB Neck ROM: Full    Dental  (+) Teeth Intact, Dental Advisory Given   Pulmonary asthma , sleep apnea , former smoker, PE   Pulmonary exam normal breath sounds clear to auscultation       Cardiovascular negative cardio ROS Normal cardiovascular exam Rhythm:Regular Rate:Normal     Neuro/Psych  Headaches, PSYCHIATRIC DISORDERS Anxiety Depression TIA Neuromuscular disease    GI/Hepatic negative GI ROS, Neg liver ROS,   Endo/Other  diabetes, Type 2, Insulin Dependent, Oral Hypoglycemic AgentsHypothyroidism Morbid obesity (BMI 72)  Renal/GU negative Renal ROS     Musculoskeletal  (+) Arthritis ,   Abdominal   Peds  Hematology  (+) Blood dyscrasia (Xarelto), ,   Anesthesia Other Findings Day of surgery medications reviewed with the patient.  Reproductive/Obstetrics                             Anesthesia Physical Anesthesia Plan  ASA: 4  Anesthesia Plan: MAC   Post-op Pain Management:    Induction: Intravenous  PONV Risk Score and Plan: 2 and Propofol infusion and Treatment may vary due to age or medical condition  Airway Management Planned: Nasal Cannula and Natural Airway  Additional Equipment:   Intra-op Plan:   Post-operative Plan:   Informed Consent: I have reviewed the patients History and Physical, chart, labs and discussed the procedure including the risks, benefits and alternatives for the proposed anesthesia with the patient or authorized representative who has indicated his/her understanding and acceptance.     Dental advisory given  Plan Discussed with: CRNA and Anesthesiologist  Anesthesia Plan Comments:         Anesthesia Quick  Evaluation

## 2020-10-10 NOTE — Anesthesia Procedure Notes (Signed)
Procedure Name: MAC Date/Time: 10/10/2020 9:53 AM Performed by: Maxwell Caul, CRNA Pre-anesthesia Checklist: Patient identified, Emergency Drugs available, Suction available and Patient being monitored Oxygen Delivery Method: Simple face mask

## 2020-10-10 NOTE — H&P (Signed)
HPI:   Cheyenne Guzman is a 55 y.o. female here at the hospital for colonoscopy. BMI 72. Strong FH of colon cancer, sister dx age 82s. History of 1cm polyp removed in 2010 or so. Denies problems with her bowels. She has medical history as outlined below, on Xarelto for PE history, she has held that a few days. She denies any cardiopulmonary symptoms today, otherwise feeling well. History of cellulitis which she has recovered from recently.   Past Medical History:  Diagnosis Date   Anxiety    per pt. - mild    Arthritis    back & ankles    Asthma    seasonal    Breast cancer (HCC)    Burning sensation of mouth 10/07/2020   Cancer ([place])    breast, sees Dr. Jana Hakim , Left - 11/2007   Candida vaginitis 10/07/2020   Cellulitis 2020   severe - Left leg    Complication of anesthesia 2013   severe muscle spasms-sore-no doc   Depression    Diabetes mellitus    sees Dr. Chalmers Cater, diagnosed 2003   HA (headache)    Hearing loss    Went to [facility] and Throat,Dr Laing,both ears   Hyperlipidemia    Kidney infection    - hosp. [facility]- septic- 10/2009   Low back pain    Muscle tear 10/07/2020   Peripheral neuropathy    R sided numbness- face & jaw   Pulmonary embolism ([place]) 2020   Sepsis ([place]) 2020   Sleep apnea 09/2013   severe-just started cpap-doing well   Sore throat 10/07/2020   TIA (transient ischemic attack)    in 2007,2008, and 2011   Tinnitus of right ear    Urosepsis 08/2009   with Klebsiella    Past Surgical History:  Procedure Laterality Date   ABDOMINAL HYSTERECTOMY  08/2004   BREAST BIOPSY  06/22/2011   Procedure: BREAST BIOPSY;  Surgeon: Stark Klein, MD;  Location: [place];  Service: General;  Laterality: Right;   BREAST LUMPECTOMY     BREAST SURGERY  11/2007   left ductal carcinoma in situ   CARPAL TUNNEL RELEASE Right 10/13/2013   Procedure: RIGHT CARPAL TUNNEL RELEASE;  Surgeon: Hessie Dibble, MD;  Location: [place];  Service:  Orthopedics;  Laterality: Right;   CARPAL TUNNEL RELEASE Left 11/24/2013   Procedure: LEFT CARPAL TUNNEL RELEASE WITH CYST EXCISION ;  Surgeon: Hessie Dibble, MD;  Location: [place];  Service: Orthopedics;  Laterality: Left;   EAR CYST EXCISION Left 11/24/2013   Procedure: CYST REMOVAL;  Surgeon: Hessie Dibble, MD;  Location: [place];  Service: Orthopedics;  Laterality: Left;   FOOT SURGERY  2000   plantar fasciitis-left   LUMBAR DISC SURGERY  03-29-14   Fusion, revision 04-27-14 L4-5 Cauda Equina with graft   NERVE GRAFT  06-01-08   cadaver graft to right inferior alveolar nerve  in [place] -[place]    Family History  Problem Relation Age of Onset   Cancer Mother        breast   Cancer Father        lung   Diabetes Father    Colon cancer Sister    Cancer Sister 61       colon   Diabetes Sister    Cancer Maternal Aunt        breast - both   Anesthesia problems Neg Hx  Social History   Tobacco Use   Smoking status: Former    Types: Cigarettes    Quit date: 02/19/1992    Years since quitting: 28.6   Smokeless tobacco: Never  Vaping Use   Vaping Use: Never used  Substance Use Topics   Alcohol use: Not Currently    Comment: rare   Drug use: No    Prior to Admission medications   Medication Sig Start Date End Date Taking? Authorizing Provider  albuterol (VENTOLIN HFA) 108 (90 Base) MCG/ACT inhaler Inhale 2 puffs into the lungs every 4 (four) hours as needed for wheezing or shortness of breath. 12/05/18  Yes Laurey Morale, MD  atorvastatin (LIPITOR) 10 MG tablet Take 1 tablet (10 mg total) by mouth daily. Patient taking differently: Take 10 mg by mouth every morning. 04/27/20  Yes Shamleffer, Melanie Crazier, MD  Buprenorphine 15 MCG/HR PTWK Place 1 patch onto the skin every Monday. 07/23/17  Yes [provider]  Cholecalciferol (VITAMIN D3) 50 MCG (2000 UT) TABS Take 2,000 Units by mouth every morning.   Yes [provider]  fluconazole (DIFLUCAN) 150 MG tablet Take 1 tablet (150 mg total) by mouth daily. 10/07/20  Yes Laurey Morale, MD  furosemide (LASIX) 40 MG tablet TAKE 1 TABLET BY MOUTH TWICE A DAY Patient taking differently: Take 40 mg by mouth See admin instructions. Take one tablet (40 mg) by mouth daily when not going out - approximately 5 times week 05/17/20  Yes Laurey Morale, MD  gabapentin (NEURONTIN) 100 MG capsule Take 100 mg by mouth 2 (two) times daily. Take with a 300 mg capsule for a total dose of 400 mg twice daily 08/31/20  Yes [provider]  gabapentin (NEURONTIN) 300 MG capsule Take 300 mg by mouth 2 (two) times daily. Take with a 100 mg capsule for a total dose of 400 mg twice daily   Yes [provider]  insulin lispro protamine-lispro (HUMALOG MIX 75/25) (75-25) 100 UNIT/ML SUSP injection INJECT 110 UNITS INTO THE SKIN 2 TIMES DAILY WITH A MEAL. Patient taking differently: Inject 120 Units into the skin 2 (two) times daily with a meal. 09/19/20  Yes Shamleffer, Melanie Crazier, MD  levothyroxine (SYNTHROID) 75 MCG tablet TAKE 1 TABLET BY MOUTH EVERY DAY Patient taking differently: Take 75 mcg by mouth daily before breakfast. 08/08/20  Yes Laurey Morale, MD  metFORMIN (GLUCOPHAGE) 1000 MG tablet TAKE 1 TABLET BY MOUTH EVERY DAY WITH BREAKFAST Patient taking differently: Take 1,000 mg by mouth every morning. 09/19/20  Yes Shamleffer, Melanie Crazier, MD  methocarbamol (ROBAXIN) 750 MG tablet Take 1 tablet (750 mg total) by mouth 2 (two) times daily. Patient taking differently: Take 750 mg by mouth every morning. 02/03/15  Yes Laurey Morale, MD  Multiple Vitamin (MULTIVITAMIN WITH MINERALS) TABS tablet Take 1 tablet by mouth daily.   Yes [provider]  Multiple Vitamins-Minerals (AIRBORNE GUMMIES) CHEW Chew 3 tablets by mouth every morning.   Yes [provider]  ondansetron (ZOFRAN) 4 MG tablet Take 1 tablet (4 mg total) by mouth as directed. Take one  Zofran pill 30-60 minutes before each colonoscopy prep dose 09/29/20  Yes Zoltan Genest, Carlota Raspberry, MD  pramipexole (MIRAPEX) 0.5 MG tablet TAKE 1 TABLET BY MOUTH EVERYDAY AT BEDTIME Patient taking differently: Take 0.5 mg by mouth at bedtime. For restless legs 09/16/20  Yes Laurey Morale, MD  sertraline (ZOLOFT) 100 MG tablet TAKE 2 TABLETS (200 MG TOTAL) BY MOUTH AT  BEDTIME. Patient taking differently: Take 200 mg by mouth every morning. 08/08/20  Yes Laurey Morale, MD  XARELTO 20 MG TABS tablet TAKE 1 TABLET BY MOUTH EVERY DAY Patient taking differently: Take 20 mg by mouth every morning. 10/26/19  Yes Laurey Morale, MD  albuterol (PROVENTIL) (2.5 MG/3ML) 0.083% nebulizer solution 1 VIAL IN NEBULIZER EVERY 4 HOURS AS NEEDED FOR WHEEZING 10/13/13   Laurey Morale, MD  Blood Glucose Monitoring Suppl (CONTOUR NEXT MONITOR) w/Device KIT 1 each by Does not apply route 2 (two) times daily. To check blood sugars twice a day. 08/27/16   Renato Shin, MD  cefadroxil (DURICEF) 500 MG capsule Take 1 capsule (500 mg total) by mouth 2 (two) times daily. Take at first signs of recurrent cellulitis 10/07/20   Tommy Medal, Lavell Islam, MD  glucose blood (CONTOUR NEXT TEST) test strip Use to test blood sugar 2 times daily 12/18/16   Renato Shin, MD  glucose blood test strip Use as instructed 08/24/16   Renato Shin, MD  Insulin Syringe-Needle U-100 (INSULIN SYRINGE 1CC/31GX5/16") 31G X 5/16" 1 ML MISC 2x daily 07/20/19   Shamleffer, Melanie Crazier, MD  lidocaine (XYLOCAINE) 5 % ointment Apply 1 application topically 2 (two) times daily as needed (pain). 12/23/19   [provider]  Na Sulfate-K Sulfate-Mg Sulf 17.5-3.13-1.6 GM/177ML SOLN Take 1 kit by mouth as directed. 09/29/20   Harpreet Signore, Carlota Raspberry, MD    Current Facility-Administered Medications  Medication Dose Route Frequency Provider Last Rate Last Admin   0.9 %  sodium chloride infusion   Intravenous Continuous Pyrtle, Lajuan Lines, MD       lactated ringers  infusion   Intravenous Continuous Wendolyn Raso, Carlota Raspberry, MD 125 mL/hr at 10/10/20 0953 Continued from Pre-op at 10/10/20 0953    Allergies as of 08/16/2020 - Review Complete 08/11/2020  Allergen Reaction Noted   Vicodin [hydrocodone-acetaminophen] Nausea And Vomiting 09/05/2017     Review of Systems:    As per HPI, otherwise negative    Physical Exam:  Vital signs in last 24 hours: Temp:  [98.1 F (36.7 C)] 98.1 F (36.7 C) (09/19 0911) Pulse Rate:  [83] 83 (09/19 0911) Resp:  [14] 14 (09/19 0911) BP: (144)/(53) 144/53 (09/19 0911) SpO2:  [95 %] 95 % (09/19 0911) Weight:  [208.7 kg] 208.7 kg (09/19 0911)   General:   Pleasant female in NAD Lungs:  Respirations even and unlabored. Lungs clear to auscultation bilaterally.    Heart:  Regular rate and rhythm; no MRG Abdomen:  Soft, nondistended, nontender. Protuberant Neurologic:  Alert and  oriented x4;  grossly normal neurologically. Psych:  Alert and cooperative. Normal affect.  Lab Results  Component Value Date   WBC 7.8 09/23/2020   HGB 11.9 (L) 09/23/2020   HCT 40.1 09/23/2020   MCV 77.9 (L) 09/23/2020   PLT 211 09/23/2020    Lab Results  Component Value Date   CREATININE 0.92 09/23/2020   BUN 15 09/23/2020   NA 136 09/23/2020   K 5.0 09/23/2020   CL 99 09/23/2020   CO2 26 09/23/2020    Lab Results  Component Value Date   ALT 15 09/19/2020   AST 22 09/19/2020   ALKPHOS 80 09/19/2020   BILITOT 0.7 09/19/2020       Impression / Plan:   55 y/o female here for surveillance colonoscopy - history of polyps, strong FH of colon cancer. Has held Xarelto a few days. Case done at hospital for anesthesia support given BMI.  I have discussed risks / benefits of colonoscopy and anesthesia with her and she agrees with the plan and wishes to proceed. Further recommendations pending the results.  Jolly Mango, MD [facility]

## 2020-10-10 NOTE — Transfer of Care (Signed)
Immediate Anesthesia Transfer of Care Note  Patient: Cheyenne Guzman  Procedure(s) Performed: COLONOSCOPY WITH PROPOFOL POLYPECTOMY  Patient Location: PACU and Endoscopy Unit  Anesthesia Type:MAC  Level of Consciousness: awake, alert  and oriented  Airway & Oxygen Therapy: Patient Spontanous Breathing and Patient connected to face mask oxygen  Post-op Assessment: Report given to RN and Post -op Vital signs reviewed and stable  Post vital signs: Reviewed and stable  Last Vitals:  Vitals Value Taken Time  BP 115/47 10/10/20 1033  Temp    Pulse 84 10/10/20 1035  Resp 9 10/10/20 1035  SpO2 98 % 10/10/20 1035  Vitals shown include unvalidated device data.  Last Pain:  Vitals:   10/10/20 1033  TempSrc:   PainSc: Asleep         Complications: No notable events documented.

## 2020-10-10 NOTE — Anesthesia Postprocedure Evaluation (Signed)
Anesthesia Post Note  Patient: Cheyenne Guzman  Procedure(s) Performed: COLONOSCOPY WITH PROPOFOL POLYPECTOMY     Patient location during evaluation: Endoscopy Anesthesia Type: MAC Level of consciousness: awake and alert Pain management: pain level controlled Vital Signs Assessment: post-procedure vital signs reviewed and stable Respiratory status: spontaneous breathing, nonlabored ventilation, respiratory function stable and patient connected to nasal cannula oxygen Cardiovascular status: stable and blood pressure returned to baseline Postop Assessment: no apparent nausea or vomiting Anesthetic complications: no   No notable events documented.  Last Vitals:  Vitals:   10/10/20 1050 10/10/20 1100  BP: 132/63 (!) 154/57  Pulse: 81 80  Resp: 10 16  Temp:    SpO2: 97% 92%    Last Pain:  Vitals:   10/10/20 1100  TempSrc:   PainSc: 0-No pain                 Catalina Gravel

## 2020-10-11 ENCOUNTER — Encounter (HOSPITAL_COMMUNITY): Payer: Self-pay | Admitting: Gastroenterology

## 2020-10-11 LAB — SURGICAL PATHOLOGY

## 2020-10-13 ENCOUNTER — Other Ambulatory Visit: Payer: Self-pay

## 2020-10-13 ENCOUNTER — Encounter: Payer: Self-pay | Admitting: Family Medicine

## 2020-10-13 MED ORDER — CIPROFLOXACIN HCL 500 MG PO TABS: 500.0000 mg | ORAL_TABLET | Freq: Two times a day (BID) | ORAL | 0 refills | Status: AC

## 2020-10-13 NOTE — Telephone Encounter (Signed)
Call in Cipro 500 mg BID for 7 days  

## 2020-10-14 ENCOUNTER — Other Ambulatory Visit: Payer: Self-pay

## 2020-10-14 ENCOUNTER — Ambulatory Visit (INDEPENDENT_AMBULATORY_CARE_PROVIDER_SITE_OTHER): Payer: Medicare Other | Admitting: Internal Medicine

## 2020-10-14 ENCOUNTER — Encounter: Payer: Self-pay | Admitting: Internal Medicine

## 2020-10-14 VITALS — BP 142/80 | HR 84 | Ht 67.0 in

## 2020-10-14 DIAGNOSIS — Z794 Long term (current) use of insulin: Secondary | ICD-10-CM

## 2020-10-14 DIAGNOSIS — E1165 Type 2 diabetes mellitus with hyperglycemia: Secondary | ICD-10-CM

## 2020-10-14 DIAGNOSIS — E1142 Type 2 diabetes mellitus with diabetic polyneuropathy: Secondary | ICD-10-CM | POA: Diagnosis not present

## 2020-10-14 MED ORDER — INSULIN PEN NEEDLE 31G X 8 MM MISC: 1.0000 | Freq: Three times a day (TID) | 3 refills | Status: DC

## 2020-10-14 MED ORDER — HUMULIN R U-500 KWIKPEN 500 UNIT/ML ~~LOC~~ SOPN: 80.0000 [IU] | PEN_INJECTOR | Freq: Three times a day (TID) | SUBCUTANEOUS | 6 refills | Status: DC

## 2020-10-14 NOTE — Patient Instructions (Addendum)
-   STOP Humalog MIX  - Start U-500 80 units with Breakfast and Lunch and 70 units with Supper  - Continue Metformin 500 mg, daily     HOW TO TREAT LOW BLOOD SUGARS (Blood sugar LESS THAN 70 MG/DL) Please follow the RULE OF 15 for the treatment of hypoglycemia treatment (when your (blood sugars are less than 70 mg/dL)   STEP 1: Take 15 grams of carbohydrates when your blood sugar is low, which includes:  3-4 GLUCOSE TABS  OR 3-4 OZ OF JUICE OR REGULAR SODA OR ONE TUBE OF GLUCOSE GEL    STEP 2: RECHECK blood sugar in 15 MINUTES STEP 3: If your blood sugar is still low at the 15 minute recheck --> then, go back to STEP 1 and treat AGAIN with another 15 grams of carbohydrates.

## 2020-10-14 NOTE — Progress Notes (Signed)
Name: Cheyenne Guzman  Age/ Sex: 55 y.o., female   MRN/ DOB: [id number], 1965-03-22     PCP: Laurey Morale, MD   Reason for Endocrinology Evaluation: Type 2 Diabetes Mellitus  Initial Endocrine Consultative Visit: 08/06/2016    PATIENT IDENTIFIER: Cheyenne Guzman is a 55 y.o. female with a past medical history of T2DM, HTN, OSA. The patient has followed with Endocrinology clinic since 08/06/2016 for consultative assistance with management of her diabetes.  DIABETIC HISTORY:  Cheyenne Guzman was diagnosed with T2DM in 2000. Has been on Metformin without side effects  .Has been on insulin since 2009. Her hemoglobin A1c has ranged from 8.2% in 2017, peaking at 10.3% in 2019.  Re-established with me 08/2018. Stopped lantus and started Novolog mix as well as metformin.   SUBJECTIVE:   Today (10/14/2020): Cheyenne Guzman is here for a follow up on diabetes management . She has not been to our clinic in 15 months.  She is accompanied by her daughter Cheyenne Guzman. She checks her blood sugars twice daily since discharge . She did not bring meter today, but had her glucose reading written in the phone.  She had a recent admission for cellulitis , has weak legs and groin pain  Has been nauseous since hospitalization  She has not filled up the paper work for [employer] Had recent colonoscopy    She eats 3 meals a day    HOME REGIMEN:  Humalog Mix 120 units BID  Metformin 500 mg, with breakfast  Ozempic 0.5 mg weekly - cost prohibitive  Lipitor 10 mg daily    Statin: yes ACE-I/ARB: no Prior Diabetic Education: Yes   METER DOWNLOAD SUMMARY: did not bring   82- 260 mg/dL   DIABETIC COMPLICATIONS: Microvascular complications:  Neuropathy, ? Mild retinopathy  Denies: CKD Last Eye Exam: Completed 2020  Macrovascular complications:  TIA Denies: CAD, CVA, PVD   HISTORY:  Past Medical History:  Past Medical History:  Diagnosis Date   Anxiety    per pt. - mild    Arthritis    back & ankles     Asthma    seasonal    Breast cancer (HCC)    Burning sensation of mouth 10/07/2020   Cancer ([place])    breast, sees Dr. Jana Hakim , Left - 11/2007   Candida vaginitis 10/07/2020   Cellulitis 2020   severe - Left leg    Complication of anesthesia 2013   severe muscle spasms-sore-no doc   Depression    Diabetes mellitus    sees Dr. Chalmers Cater, diagnosed 2003   HA (headache)    Hearing loss    Went to [facility] and [facility],Dr Laing,[facility]   Hyperlipidemia    Kidney infection    - hosp. [facility]- septic- 10/2009   Low back pain    Muscle tear 10/07/2020   Peripheral neuropathy    R sided numbness- face & jaw   Pulmonary embolism ([place]) 2020   Sepsis ([place]) 2020   Sleep apnea 09/2013   severe-just started cpap-doing well   Sore throat 10/07/2020   TIA (transient ischemic attack)    in 2007,2008, and 2011   Tinnitus of right ear    Urosepsis 08/2009   with Klebsiella   Past Surgical History:  Past Surgical History:  Procedure Laterality Date   ABDOMINAL HYSTERECTOMY  08/2004   BREAST BIOPSY  06/22/2011   Procedure: BREAST BIOPSY;  Surgeon: Stark Klein, MD;  Location: [place];  Service: General;  Laterality: Right;   BREAST LUMPECTOMY     BREAST SURGERY  11/2007   left ductal carcinoma in situ   CARPAL TUNNEL RELEASE Right 10/13/2013   Procedure: RIGHT CARPAL TUNNEL RELEASE;  Surgeon: Hessie Dibble, MD;  Location: [place];  Service: Orthopedics;  Laterality: Right;   CARPAL TUNNEL RELEASE Left 11/24/2013   Procedure: LEFT CARPAL TUNNEL RELEASE WITH CYST EXCISION ;  Surgeon: Hessie Dibble, MD;  Location: [place];  Service: Orthopedics;  Laterality: Left;   COLONOSCOPY WITH PROPOFOL N/A 10/10/2020   Procedure: COLONOSCOPY WITH PROPOFOL;  Surgeon: Yetta Flock, MD;  Location: [facility];  Service: Gastroenterology;  Laterality: N/A;   EAR CYST EXCISION Left 11/24/2013   Procedure: CYST REMOVAL;  Surgeon: Hessie Dibble, MD;   Location: [place];  Service: Orthopedics;  Laterality: Left;   FOOT SURGERY  2000   plantar fasciitis-left   LUMBAR DISC SURGERY  03-29-14   Fusion, revision 04-27-14 L4-5 Cauda Equina with graft   NERVE GRAFT  06-01-08   cadaver graft to right inferior alveolar nerve  in [place]   POLYPECTOMY  10/10/2020   Procedure: POLYPECTOMY;  Surgeon: Yetta Flock, MD;  Location: [facility];  Service: Gastroenterology;;   Social History:  reports that she quit smoking about 28 years ago. Her smoking use included cigarettes. She has never used smokeless tobacco. She reports that she does not currently use alcohol. She reports that she does not use drugs. Family History:  Family History  Problem Relation Age of Onset   Cancer Mother        breast   Cancer Father        lung   Diabetes Father    Colon cancer Sister    Cancer Sister 53       colon   Diabetes Sister    Cancer Maternal Aunt        breast - [facility]   Anesthesia problems Neg Hx      HOME MEDICATIONS: Allergies as of 10/14/2020       Reactions   Vicodin [hydrocodone-acetaminophen] Nausea And Vomiting        Medication List        Accurate as of October 14, 2020  8:28 AM. If you have any questions, ask your nurse or doctor.          STOP taking these medications    fluconazole 150 MG tablet Commonly known as: Diflucan Stopped by: Dorita Sciara, MD       TAKE these medications    Airborne Gummies Chew Chew 3 tablets by mouth every morning.   albuterol (2.5 MG/3ML) 0.083% nebulizer solution Commonly known as: PROVENTIL 1 VIAL IN NEBULIZER EVERY 4 HOURS AS NEEDED FOR WHEEZING   albuterol 108 (90 Base) MCG/ACT inhaler Commonly known as: VENTOLIN HFA Inhale 2 puffs into the lungs every 4 (four) hours as needed for wheezing or shortness of breath.   atorvastatin 10 MG tablet Commonly known as: LIPITOR Take 1 tablet (10 mg total) by mouth daily. What changed: when to take  this   buprenorphine 15 MCG/HR Commonly known as: BUTRANS Place 1 patch onto the skin every Monday.   cefadroxil 500 MG capsule Commonly known as: DURICEF Take 1 capsule (500 mg total) by mouth 2 (two) times daily. Take at first signs of recurrent cellulitis   ciprofloxacin 500 MG tablet Commonly known as: Cipro Take 1 tablet (500 mg total) by mouth 2 (  two) times daily for 7 days.   Contour Next Monitor w/Device Kit 1 each by Does not apply route 2 (two) times daily. To check blood sugars twice a day.   furosemide 40 MG tablet Commonly known as: LASIX TAKE 1 TABLET BY MOUTH TWICE A DAY What changed:  when to take this additional instructions   gabapentin 300 MG capsule Commonly known as: NEURONTIN Take 300 mg by mouth 2 (two) times daily. Take with a 100 mg capsule for a total dose of 400 mg twice daily   gabapentin 100 MG capsule Commonly known as: NEURONTIN Take 100 mg by mouth 2 (two) times daily. Take with a 300 mg capsule for a total dose of 400 mg twice daily   glucose blood test strip Use as instructed   glucose blood test strip Commonly known as: Contour Next Test Use to test blood sugar 2 times daily   HumaLOG Mix 75/25 (75-25) 100 UNIT/ML Susp injection Generic drug: insulin lispro protamine-lispro INJECT 110 UNITS INTO THE SKIN 2 TIMES DAILY WITH A MEAL. What changed:  how much to take how to take this when to take this additional instructions   INSULIN SYRINGE 1CC/31GX5/16" 31G X 5/16" 1 ML Misc 2x daily   levothyroxine 75 MCG tablet Commonly known as: SYNTHROID TAKE 1 TABLET BY MOUTH EVERY DAY What changed: when to take this   lidocaine 5 % ointment Commonly known as: XYLOCAINE Apply 1 application topically 2 (two) times daily as needed (pain).   metFORMIN 1000 MG tablet Commonly known as: GLUCOPHAGE TAKE 1 TABLET BY MOUTH EVERY DAY WITH BREAKFAST What changed: See the new instructions.   methocarbamol 750 MG tablet Commonly known as:  ROBAXIN Take 1 tablet (750 mg total) by mouth 2 (two) times daily. What changed: when to take this   multivitamin with minerals Tabs tablet Take 1 tablet by mouth daily.   ondansetron 4 MG tablet Commonly known as: ZOFRAN Take 1 tablet (4 mg total) by mouth as directed. Take one Zofran pill 30-60 minutes before each colonoscopy prep dose   pramipexole 0.5 MG tablet Commonly known as: MIRAPEX TAKE 1 TABLET BY MOUTH EVERYDAY AT BEDTIME What changed: See the new instructions.   sertraline 100 MG tablet Commonly known as: ZOLOFT TAKE 2 TABLETS (200 MG TOTAL) BY MOUTH AT BEDTIME. What changed: when to take this   Vitamin D3 50 MCG (2000 UT) Tabs Take 2,000 Units by mouth every morning.   Xarelto 20 MG Tabs tablet Generic drug: rivaroxaban TAKE 1 TABLET BY MOUTH EVERY DAY What changed:  how much to take when to take this         OBJECTIVE:   Vital Signs: BP (!) 142/80 (BP Location: Left Arm, Patient Position: Sitting, Cuff Size: Small)   Pulse 84   Ht 5' 7" (1.702 m)   SpO2 95%   BMI 72.05 kg/m   Wt Readings from Last 3 Encounters:  10/10/20 (!) 460 lb (208.7 kg)  09/29/20 (!) 462 lb (209.6 kg)  09/23/20 (!) 462 lb 12.8 oz (209.9 kg)     Exam: General: Pt appears well and is in NAD, in a wheelchair  Lungs: Clear with good BS bilat with no rales, rhonchi, or wheezes  Heart: RRR  Extremities: Trace edema   Neuro: MS is good with appropriate affect, pt is alert and Ox3     DATA REVIEWED:  Lab Results  Component Value Date   HGBA1C 10.6 (H) 09/20/2020   HGBA1C 9.2 (H) 02/19/2020     HGBA1C 10.1 (A) 06/19/2019     Results for Deskin, Simrat S (MRN [id number]) as of 10/14/2020 08:19  Ref. Range 09/23/2020 05:27  Sodium Latest Ref Range: 135 - 145 mmol/L 136  Potassium Latest Ref Range: 3.5 - 5.1 mmol/L 5.0  Chloride Latest Ref Range: 98 - 111 mmol/L 99  CO2 Latest Ref Range: 22 - 32 mmol/L 26  Glucose Latest Ref Range: 70 - 99 mg/dL 198 (H)  BUN Latest Ref  Range: 6 - 20 mg/dL 15  Creatinine Latest Ref Range: 0.44 - 1.00 mg/dL 0.92  Calcium Latest Ref Range: 8.9 - 10.3 mg/dL 9.0  Anion gap Latest Ref Range: 5 - 15  11  Magnesium Latest Ref Range: 1.7 - 2.4 mg/dL 1.7  GFR, Estimated Latest Ref Range: >60 mL/min >60      ASSESSMENT / PLAN / RECOMMENDATIONS:   1) Type 2 Diabetes Mellitus, Poorly  controlled, With retinopathic and neuropathic complications - Most recent A1c of 10.6 %. Goal A1c < 7.0 %.   - Poorly controlled diabetes due to dietary indiscretions, she assures me compliance with insulin intake. -Last year I have provided her with patient assistance form for Ozempic but she did not bring them -I am going to switch her from Humalog mix to Humulin U-500, she understands the risk of hypoglycemia due to concentrated insulin -She was advised to take this 20-30 minutes before each meal 3 times a day  MEDICATIONS: -Stop insulin mix -Start Humulin U-500 at 80 units before breakfast, 80 units before lunch and 70 units before supper - Continue Metformin 500 mg daily beakfast    EDUCATION / INSTRUCTIONS: BG monitoring instructions: Patient is instructed to check her blood sugars 3 times a day, before each meal Call [place] [facility] if: BG persistently < 70  I reviewed the Rule of 15 for the treatment of hypoglycemia in detail with the patient. Literature supplied.    2) Diabetic complications:  Eye: Does have known diabetic retinopathy. Per pt (mild) Neuro/ Feet: Does have known diabetic peripheral neuropathy .  Renal: Patient does not have known baseline CKD. She   is not on an ACEI/ARB at present.   3) Dyslipidemia: Tolerating Atorvastatin 10 mg daily    -LDL has been acceptable at 89 mg/DL 04/2020.  LDL was 151 in 2020.   4) Morbid obesity , BMI 72:   -She has agreed to proceed with bariatric weight loss surgery which is a great thing for her. -Referral has been placed   F/U in 3 months    Signed  electronically by: Abby Jaralla Shamleffer, MD  [facility]  [facility] [address] [place], [place] [address] Phone: [phone] FAX: [phone]   CC: Fry, Stephen A, MD [address] [place] [address] Phone: [phone]  Fax: [phone]  Return to Endocrinology clinic as below: Future Appointments  Date Time Provider Department Center  11/01/2020  2:00 PM [facility] PHARMACIST [facility]  12/07/2020  1:45 PM [place] Dam, Cornelius N, MD [facility]  02/13/2021 10:30 AM [facility]SE HEALTH ADVISOR [facility]      

## 2020-10-16 ENCOUNTER — Other Ambulatory Visit: Payer: Self-pay | Admitting: Internal Medicine

## 2020-10-16 DIAGNOSIS — E785 Hyperlipidemia, unspecified: Secondary | ICD-10-CM

## 2020-10-17 ENCOUNTER — Other Ambulatory Visit: Payer: Self-pay | Admitting: Internal Medicine

## 2020-10-17 DIAGNOSIS — G4733 Obstructive sleep apnea (adult) (pediatric): Secondary | ICD-10-CM | POA: Diagnosis not present

## 2020-10-18 ENCOUNTER — Other Ambulatory Visit: Payer: Self-pay | Admitting: Family Medicine

## 2020-10-31 ENCOUNTER — Telehealth: Payer: Self-pay | Admitting: Pharmacist

## 2020-10-31 NOTE — Chronic Care Management (AMB) (Signed)
    Chronic Care Management Pharmacy Assistant   Name: Cheyenne Guzman  MRN: [id number] DOB: 19-Mar-1965  11/01/2020 APPOINTMENT REMINDER   Called Cheyenne Guzman, No answer, left message of appointment on 11/01/2020 at 2:00 via telephone visit with Jeni Salles, Pharm D. Notified to have all medications, supplements, blood pressure and/or blood sugar logs available during appointment and to return call if need to reschedule.   Care Gaps: AWV - scheduled for 02/13/2021 Eye Exam - never done Hep C screen - never done TDAP - never done Shingrix - never done Pap Smear - never done Foot Exam  - overdue Urine Microalbumin - overdue Covid vaccine booster 4 - overdue Flu - due Last BP reading - 142/80 on 10/14/2021 Last HGA1C - 10.6 on 09/20/2020  Star Rating Drug: Atorvastatin 10 mg - last filled 10/17/2020 [facility] Metformin 1000 mg - last filled 09/19/2020 [facility] at [facility]  Any gaps in medications fill history?  [place] Pharmacist Assistant [phone]

## 2020-11-01 ENCOUNTER — Ambulatory Visit (INDEPENDENT_AMBULATORY_CARE_PROVIDER_SITE_OTHER): Payer: Medicare Other | Admitting: Pharmacist

## 2020-11-01 DIAGNOSIS — E1165 Type 2 diabetes mellitus with hyperglycemia: Secondary | ICD-10-CM

## 2020-11-01 DIAGNOSIS — E785 Hyperlipidemia, unspecified: Secondary | ICD-10-CM

## 2020-11-01 NOTE — Progress Notes (Signed)
Chronic Care Management Pharmacy Note  11/21/2020 Name:  Cheyenne Guzman MRN:  [id number] DOB:  1965-06-03  Summary: A1c not at goal < 7%  Recommendations/Changes made from today's visit: -Recommended searching for other pharmacies that will break the box for insulins -Recommended Dexcom for continuous glucose monitoring  Plan: Apply for Humulin PAP Follow up DM assessment in 1 month  Subjective: Cheyenne Guzman is an 55 y.o. year old female who is a primary patient of Laurey Morale, MD.  The CCM team was consulted for assistance with disease management and care coordination needs.    Engaged with patient by telephone for follow up visit in response to provider referral for pharmacy case management and/or care coordination services.   Consent to Services:  The patient was given information about Chronic Care Management services, agreed to services, and gave verbal consent prior to initiation of services.  Please see initial visit note for detailed documentation.   Patient Care Team: Laurey Morale, MD as PCP - General Hilty, Nadean Corwin, MD as PCP - Cardiology (Cardiology) Izora Gala, MD as Consulting Physician (Otolaryngology) Keene Breath., MD (Ophthalmology) Viona Gilmore, [facility] as Pharmacist (Pharmacist)  Recent office visits: 05/19/20 [name] MD (PCP) - seen for UTI and other issues. Patient started on ciprofloxacin 525m orally two times daily. No follow up noted.   Recent consult visits: 10/14/20 ICollier Flowers MD (endo): Patient presented for DM follow up. D/c'd Novolog mix and started Humulin U-500 80 units before breakfast, 80 units before lunch and 70 units before supper.  10/07/20 CAlcide Evener MD (ID): Patient presented for cellulitis follow up. Prescribed cefadroxil if she notices cellulitis return.  07/07/20 KLyman BishopMD (Cardiology) - seen for shortness of breath and other issues. Discontinued ciprofloxacin 508m- two times daily.follow up in  6 months and if symptoms worsen will consider right heart cath.    05/31/20 ElBritta MccreedyP (Pain Management) -  seen for televisit for medication management and chronic pain. No medication changes and follow up with rheumatology.   [facility]: [facility]  [facility]  [facility]  Objective:  Lab Results  Component Value Date   CREATININE 0.92 09/23/2020   BUN 15 09/23/2020   GFR 56.49 (L) 11/05/2018   GFRNONAA >60 09/23/2020   GFRAA >60 04/09/2019   NA 136 09/23/2020   K 5.0 09/23/2020   CALCIUM 9.0 09/23/2020   CO2 26 09/23/2020   GLUCOSE 198 (H) 09/23/2020    Lab Results  Component Value Date/Time   HGBA1C 10.6 (H) 09/20/2020 07:24 AM   HGBA1C 9.2 (H) 02/19/2020 04:11 PM   FRUCTOSAMINE 327 (H) 06/21/2015 10:40 AM   GFR 56.49 (L) 11/05/2018 04:11 PM   GFR 72.54 05/22/2017 02:22 PM   MICROALBUR 15.7 (H) 11/05/2018 04:11 PM   MICROALBUR 1.5 04/14/2009 11:39 AM    Last diabetic Eye exam: No results found for: HMDIABEYEEXA  Last diabetic Foot exam: No results found for: HMDIABFOOTEX   Lab Results  Component Value Date   CHOL 162 05/19/2020   HDL 50.70 05/19/2020   LDLCALC 89 05/19/2020   LDLDIRECT 151.0 11/05/2018   TRIG 108.0 05/19/2020   CHOLHDL 3 05/19/2020    Hepatic Function Latest Ref Rng & Units 09/19/2020 09/16/2020 02/19/2020  Total Protein 6.5 - 8.1 g/dL 7.3 6.9  7.2  Albumin 3.5 - 5.0 g/dL 4.1 3.7 -  AST 15 - 41 U/L '22 18 20  ' ALT 0 - 44 U/L '15 16 16  ' Alk Phosphatase 38 - 126 U/L 80 84 -  Total Bilirubin 0.3 - 1.2 mg/dL 0.7 0.4 0.5  Bilirubin, Direct 0.0 - 0.2 mg/dL - - 0.1    Lab Results  Component Value Date/Time   TSH 3.45 02/19/2020 04:11 PM   TSH 2.57 11/05/2018 04:11 PM   FREET4 1.2 02/19/2020 04:11 PM   FREET4 0.91  11/05/2018 04:11 PM    CBC Latest Ref Rng & Units 09/23/2020 09/22/2020 09/21/2020  WBC 4.0 - 10.5 K/uL 7.8 7.0 8.4  Hemoglobin 12.0 - 15.0 g/dL 11.9(L) 10.4(L) 10.3(L)  Hematocrit 36.0 - 46.0 % 40.1 35.0(L) 34.4(L)  Platelets 150 - 400 K/uL 211 173 153    No results found for: VD25OH  Clinical ASCVD: No  The 10-year ASCVD risk score (Arnett DK, et al., 2019) is: 4%   Values used to calculate the score:     Age: 27 years     Sex: Female     Is Non-Hispanic African American: No     Diabetic: Yes     Tobacco smoker: No     Systolic Blood Pressure: 275 mmHg     Is BP treated: No     HDL Cholesterol: 50.7 mg/dL     Total Cholesterol: 162 mg/dL    Depression screen [facility] 2/9 02/12/2020 08/12/2018  Decreased Interest 0 0  Down, Depressed, Hopeless 1 0  PHQ - 2 Score 1 0  Altered sleeping 3 -  Tired, decreased energy 3 -  Change in appetite 0 -  Feeling bad or failure about yourself  3 -  Trouble concentrating 0 -  Moving slowly or fidgety/restless 0 -  Suicidal thoughts 0 -  PHQ-9 Score 10 -  Difficult doing work/chores Somewhat difficult -  Some recent data might be hidden      Social History   Tobacco Use  Smoking Status Former   Types: Cigarettes   Quit date: 02/19/1992   Years since quitting: 28.7  Smokeless Tobacco Never   BP Readings from Last 3 Encounters:  10/14/20 (!) 142/80  10/10/20 (!) 154/57  10/07/20 (!) 148/74   Pulse Readings from Last 3 Encounters:  10/14/20 84  10/10/20 80  10/07/20 84   Wt Readings from Last 3 Encounters:  10/10/20 (!) 460 lb (208.7 kg)  09/29/20 (!) 462 lb (209.6 kg)  09/23/20 (!) 462 lb 12.8 oz (209.9 kg)   BMI Readings from Last 3 Encounters:  10/14/20 72.05 kg/m  10/10/20 72.05 kg/m  09/29/20 72.36 kg/m    Assessment/Interventions: Review of patient past medical history, allergies, medications, health status, including review of consultants reports, laboratory and other test data, was performed as part of comprehensive  evaluation and provision of chronic care management services.   SDOH:  (Social Determinants of Health) assessments and interventions performed: No   SDOH Screenings   Alcohol Screen: Low Risk    Last Alcohol Screening Score (AUDIT): 0  Depression (PHQ2-9): Medium Risk   PHQ-2 Score: 10  Financial Resource Strain: High Risk   Difficulty of Paying Living Expenses: Hard  Food Insecurity: Food Insecurity Present   Worried About Charity fundraiser in the Last Year: Sometimes true   Ran Out of Food in the Last Year: Sometimes true  Housing: Low Risk    Last Housing Risk Score: 0  Physical Activity: Inactive  Days of Exercise per Week: 0 days   Minutes of Exercise per Session: 0 min  Social Connections: Moderately Integrated   Frequency of Communication with Friends and Family: More than three times a week   Frequency of Social Gatherings with Friends and Family: More than three times a week   Attends Religious Services: More than 4 times per year   Active Member of [employer] or Organizations: No   Attends Archivist Meetings: Never   Marital Status: Married  Stress: Stress Concern Present   Feeling of Stress : To some extent  Tobacco Use: Medium Risk   Smoking Tobacco Use: Former   Smokeless Tobacco Use: Never   Passive Exposure: Not on file  Transportation Needs: No Transportation Needs   Lack of Transportation (Medical): No   Lack of Transportation (Non-Medical): No   CCM Care Plan  Allergies  Allergen Reactions   Vicodin [Hydrocodone-Acetaminophen] Nausea And Vomiting    Medications Reviewed Today     Reviewed by Cloyd Stagers, MD (Physician) on 10/14/20 at 1206  Med List Status: <None>   Medication Order Taking? Sig Documenting Provider Last Dose Status Informant  albuterol (PROVENTIL) (2.5 MG/3ML) 0.083% nebulizer solution [id number] Yes 1 VIAL IN NEBULIZER EVERY 4 HOURS AS NEEDED FOR WHEEZING Laurey Morale, MD Taking Active Self  albuterol  (VENTOLIN HFA) 108 (90 Base) MCG/ACT inhaler [id number] Yes Inhale 2 puffs into the lungs every 4 (four) hours as needed for wheezing or shortness of breath. Laurey Morale, MD Taking Active Self  atorvastatin (LIPITOR) 10 MG tablet [id number] Yes Take 1 tablet (10 mg total) by mouth daily.  Patient taking differently: Take 10 mg by mouth every morning.   Shamleffer, Melanie Crazier, MD Taking Active   Blood Glucose Monitoring Suppl (CONTOUR NEXT MONITOR) w/Device KIT [id number] Yes 1 each by Does not apply route 2 (two) times daily. To check blood sugars twice a day. Renato Shin, MD Taking Active Self  Buprenorphine 15 MCG/HR [place] [id number] Yes Place 1 patch onto the skin every Monday. [provider] Taking Active Self           Med Note Orvan Seen, Tally Joe Sep 19, 2020  3:55 PM) #4/28 days [facility] - current patch is on upper right arm  cefadroxil (DURICEF) 500 MG capsule [id number] Yes Take 1 capsule (500 mg total) by mouth 2 (two) times daily. Take at first signs of recurrent cellulitis Tommy Medal, Lavell Islam, MD Taking Active   Cholecalciferol (VITAMIN D3) 50 MCG (2000 UT) TABS [id number] Yes Take 2,000 Units by mouth every morning. [provider] Taking Active Self  ciprofloxacin (CIPRO) 500 MG tablet [id number] Yes Take 1 tablet (500 mg total) by mouth 2 (two) times daily for 7 days. Laurey Morale, MD Taking Active   furosemide (LASIX) 40 MG tablet [id number] Yes TAKE 1 TABLET BY MOUTH TWICE A DAY  Patient taking differently: Take 40 mg by mouth See admin instructions. Take one tablet (40 mg) by mouth daily when not going out - approximately 5 times week   Laurey Morale, MD Taking Active   gabapentin (NEURONTIN) 100 MG capsule [id number] Yes Take 100 mg by mouth 2 (two) times daily. Take with a 300 mg capsule for a total dose of 400 mg twice daily [provider] Taking Active Self  gabapentin (NEURONTIN) 300 MG capsule [id number] Yes Take  300 mg by mouth 2 (two) times daily. Take with a 100  mg capsule for a total dose of 400 mg twice daily [provider] Taking Active Self  glucose blood (CONTOUR NEXT TEST) test strip [id number] Yes Use to test blood sugar 2 times daily Renato Shin, MD Taking Active Self  glucose blood test strip [id number] Yes Use as instructed Renato Shin, MD Taking Active Self  Insulin Pen Needle 31G X 8 MM MISC [id number] Yes 1 Device by Does not apply route 3 (three) times daily. Shamleffer, Melanie Crazier, MD  Active   insulin regular human CONCENTRATED (HUMULIN R U-500 KWIKPEN) 500 UNIT/ML KwikPen [id number] Yes Inject 80 Units into the skin 3 (three) times daily with meals. Shamleffer, Melanie Crazier, MD  Active   Insulin Syringe-Needle U-100 (INSULIN SYRINGE 1CC/31GX5/16") 31G X 5/16" 1 ML MISC [id number] Yes 2x daily Shamleffer, Melanie Crazier, MD Taking Active Self  levothyroxine (SYNTHROID) 75 MCG tablet [id number] Yes TAKE 1 TABLET BY MOUTH EVERY DAY  Patient taking differently: Take 75 mcg by mouth daily before breakfast.   Laurey Morale, MD Taking Active   lidocaine (XYLOCAINE) 5 % ointment [id number] Yes Apply 1 application topically 2 (two) times daily as needed (pain). [provider] Taking Active Self           Med Note Orvan Seen, Tally Joe Sep 19, 2020  3:58 PM)    metFORMIN (GLUCOPHAGE) 1000 MG tablet [id number] Yes TAKE 1 TABLET BY MOUTH EVERY DAY WITH BREAKFAST  Patient taking differently: Take 1,000 mg by mouth every morning.   Shamleffer, Melanie Crazier, MD Taking Active   methocarbamol (ROBAXIN) 750 MG tablet [id number] Yes Take 1 tablet (750 mg total) by mouth 2 (two) times daily.  Patient taking differently: Take 750 mg by mouth every morning.   Laurey Morale, MD Taking Active   Multiple Vitamin (MULTIVITAMIN WITH MINERALS) TABS tablet [id number] Yes Take 1 tablet by mouth daily. [provider] Taking Active Self  Multiple Vitamins-Minerals (AIRBORNE  GUMMIES) CHEW [id number] Yes Chew 3 tablets by mouth every morning. [provider] Taking Active Self  ondansetron (ZOFRAN) 4 MG tablet [id number] Yes Take 1 tablet (4 mg total) by mouth as directed. Take one Zofran pill 30-60 minutes before each colonoscopy prep dose Armbruster, Carlota Raspberry, MD Taking Active Self           Med Note Wilmon Pali, MELISSA R   Tue Oct 04, 2020  1:33 PM) Before procedure   pramipexole (MIRAPEX) 0.5 MG tablet [id number] Yes TAKE 1 TABLET BY MOUTH EVERYDAY AT BEDTIME  Patient taking differently: Take 0.5 mg by mouth at bedtime. For restless legs   Laurey Morale, MD Taking Active   sertraline (ZOLOFT) 100 MG tablet [id number] Yes TAKE 2 TABLETS (200 MG TOTAL) BY MOUTH AT BEDTIME.  Patient taking differently: Take 200 mg by mouth every morning.   Laurey Morale, MD Taking Active   XARELTO 20 MG TABS tablet [id number] Yes TAKE 1 TABLET BY MOUTH EVERY DAY  Patient taking differently: Take 20 mg by mouth every morning.   Laurey Morale, MD Taking Active             Patient Active Problem List   Diagnosis Date Noted   Morbid obesity [facility]) 10/14/2020   History of colonic polyps    Family history of colon cancer    Benign neoplasm of colon    Muscle tear 10/07/2020   Burning sensation of mouth 10/07/2020   Candida vaginitis 10/07/2020   Sore throat 10/07/2020   Groin pain,  left 09/19/2020   Left buttock pain 09/19/2020   Fatty liver 09/19/2020   Splenomegaly 09/19/2020   Left nephrolithiasis 09/19/2020   Restless legs syndrome 04/14/2019   Dyslipidemia 11/07/2018   Type 2 diabetes mellitus with hyperglycemia, with long-term current use of insulin ([place]) 09/18/2018   Type 2 diabetes mellitus with diabetic polyneuropathy, with long-term current use of insulin ([place]) 09/18/2018   Recurrent cellulitis of lower extremity 07/31/2018   Serratia infection 07/31/2018   Morbid obesity with BMI of 70 and over, adult ([place])    Sepsis ([place]) 06/19/2018   Chronic pain  06/19/2018   Current use of long term anticoagulation 03/28/2018   Pulmonary embolism ([place]) 03/12/2018   Diabetes mellitus type 2 in obese ([place]) 03/12/2018   History of TIA (transient ischemic attack) 02/06/2018   Mild intermittent asthma without complication 98/92/1194   History of pulmonary embolism 2020   Hypothyroidism 08/14/2017   Lymphedema of both lower extremities 10/18/2015   Diabetic neuropathy ([place]) 01/07/2014   Congenital spondylolysis of lumbosacral region 11/02/2013   Chronic LBP 10/08/2013   Obstructive sleep apnea 07/31/2013   Drug induced headache    Hearing loss 06/23/2013   Shortness of breath 11/27/2012   Cellulitis of left lower extremity 07/03/2012   DCIS (ductal carcinoma in situ) of breast, left, treated with BCT 11/2007 02/19/2011   Asthma 02/20/2008   Hyperlipidemia associated with type 2 diabetes mellitus ([place]) 11/26/2006   Anxiety and depression 11/26/2006    Immunization History  Administered Date(s) Administered   Influenza Whole 10/23/2006   Influenza, Quadrivalent, Recombinant, Inj, Pf 10/20/2017   Influenza,inj,Quad PF,6+ Mos 11/28/2012, 03/05/2014, 11/04/2018   Influenza-Unspecified 02/03/2020   PFIZER(Purple Top)SARS-COV-2 Vaccination 07/06/2019, 07/27/2019   Pneumococcal Polysaccharide-23 11/23/2006, 11/28/2012   Patient has not has a steady food schedule and is working on aligning taking her insulin with when she eats.   Conditions to be addressed/monitored:  Hyperlipidemia, Diabetes, Asthma, Hypothyroidism, Depression, Anxiety and Neuropathy and Chronic pain  Conditions addressed this visit: Diabetes, dyslipidemia  Care Plan : CCM Pharmacy Care Plan  Updates made by Viona Gilmore, [place] since 11/21/2020 12:00 AM     Problem: Problem: Hyperlipidemia, Diabetes, Asthma, Hypothyroidism, Depression, Anxiety and Neuropathy and Chronic pain      Long-Range Goal: Patient-Specific Goal   Start Date: 05/17/2020  Expected End Date:  05/17/2021  Recent Progress: On track  Priority: High  Note:   Current Barriers:  Unable to independently monitor therapeutic efficacy Unable to achieve control of diabetes   Pharmacist Clinical Goal(s):  Patient will achieve adherence to monitoring guidelines and medication adherence to achieve therapeutic efficacy achieve control of diabetes as evidenced by A1c  through collaboration with PharmD and provider.   Interventions: 1:1 collaboration with Laurey Morale, MD regarding development and update of comprehensive plan of care as evidenced by provider attestation and co-signature Inter-disciplinary care team collaboration (see longitudinal plan of care) Comprehensive medication review performed; medication list updated in electronic medical record  Hyperlipidemia: (LDL goal < 100) -Uncontrolled -Current treatment: Atorvastatin 10 mg 1 tablet daily -Medications previously tried: statins (muscle pain with other statins) -Current dietary patterns: does not eat fried foods and limits take out -Current exercise habits: using cubii twice daily for 30 minutes and increasing time spent -Educated on Cholesterol goals;  Benefits of statin for ASCVD risk reduction; Importance of limiting foods high in cholesterol; -Counseled on diet and exercise extensively Recommended to continue current medication Recommended repeat lipid panel  Diabetes (A1c goal <7%) -Uncontrolled -Current medications:  Humulin U-500 ml 80 units before breakfast, 80 units before lunch and 70 units before supper Metformin 1000 mg daily Ozempic 0.5 mg inject once weekly - not started -Medications previously tried: Lantus, Humalog  -Current home glucose readings fasting glucose: variable post prandial glucose: variable -Denies hypoglycemic/hyperglycemic symptoms -Current meal patterns:  breakfast: 2 yogurts and fruit or bananas and rice cake with PB lunch: salad (garden mix, crunchy toppings, pickled beets, ham,  boiled egg), lite raspberry vinegrette; keto sliced bread with ham and lettuce tomatoes dinner: cauliflower, grilled foods (does not fry foods), [place] burgers in air fryer snacks: pita chips with hummus or salsa drinks: did not discuss -Current exercise: cubii twice daily for 30 minutes -Educated on A1c and blood sugar goals; Exercise goal of 150 minutes per week; Benefits of routine self-monitoring of blood sugar; Carbohydrate counting and/or plate method -Counseled to check feet daily and get yearly eye exams -Counseled on diet and exercise extensively Recommended to continue current medication Assessed patient finances. Will apply for assistance for Ozempic in order to start.  Asthma (Goal: control symptoms) -Controlled -Current treatment  Albuterol HFA 1 puff as needed Albuterol nebulizer 1 vial as needed -Medications previously tried: none  -Patient denies consistent use of maintenance inhaler -Frequency of rescue inhaler use: during the spring and fall -Counseled on When to use rescue inhaler -Recommended to continue current medication  Depression/Anxiety (Goal: minimize symptoms) -Controlled -Current treatment: Alprazolam 1 mg 1 tablet three times daily as needed Sertraline 100 mg 2 tablets daily -Medications previously tried/failed: none -PHQ9: 10 -GAD7: n/a -Educated on Benefits of medication for symptom control -Recommended to continue current medication  Hypothyroidism (Goal: TSH 0.35-4.5) -Controlled -Current treatment  Levothyroxine 75 mcg 1 tablet daily -Medications previously tried: none  -Recommended to continue current medication Counseled on taking on an empty stomach and separating from multivitamin  Edema (Goal: minimize swelling) -Uncontrolled -Current treatment  Furosemide 40 mg 1 tablet twice daily -Medications previously tried: none  -Recommended to continue current medication Counseled on limiting salt intake  Chronic pain (Goal: minimize  symptoms) -Controlled -Current treatment  Lidocaine 5% ointment Butrans 15 mcg/hr patch 1 patch weekly Methocarbamol 750 mg 1 tablet twice daily -Medications previously tried: Cyclobenzaprine (drowsy) -Recommended to continue current medication  Neuropathy (Goal: minimize symptoms) -Controlled -Current treatment  Gabapentin 300 mg 1 capsule twice daily -Medications previously tried: none  -Recommended to continue current medication  Restless legs syndrome  (Goal: minimize symptoms) -Controlled -Current treatment  Pramipexole 0.5 mg 1 tablet at bedtime as needed -Medications previously tried: none  -Recommended to continue current medication Counseled on limiting caffeine intake  History of PE (Goal: prevent future PE) -Controlled -Current treatment  Xarelto 20 mg 1 tablet daily with food -Medications previously tried: none  -Recommended to continue current medication Assessed patient finances. Patient denied Xarelto patient assistance.  Health Maintenance -Vaccine gaps: shingles, tetanus -Current therapy:  Multivitamin 1 tablet daily [place] daily Vitamin D 1000 units 1 tablet daily -Educated on Cost vs benefit of each product must be carefully weighed by individual consumer -Patient is satisfied with current therapy and denies issues -Recommended to continue current medication  Patient Goals/Self-Care Activities Patient will:  - check glucose daily, document, and provide at future appointments target a minimum of 150 minutes of moderate intensity exercise weekly  Follow Up Plan: Telephone follow up appointment with care management team member scheduled for:       Medication Assistance: Application for Ozempic  medication assistance program. in process.  Anticipated assistance  start date 12/17/20.  See plan of care for additional detail.  Compliance/Adherence/Medication fill history: Care Gaps: AWV - scheduled for 02/13/2021 Eye Exam - never done Hep C  screen - never done TDAP - never done Shingrix - never done Pap Smear - never done Foot Exam  - overdue Urine Microalbumin - overdue Covid vaccine booster 4 - overdue Flu - due Last BP reading - 142/80 on 10/14/2021 Last HGA1C - 10.6 on 09/20/2020   Star-Rating Drugs: Atorvastatin 10 mg - [facility] Metformin 1000 mg - [facility]  [facility]  [id number] [place] [address] [address] [address]Phone: [phone]: [phone] [id number]: [phone]: [phone] Uses pill box? Yes - 31 day pill box Pt endorses 100% compliance  We discussed: Current pharmacy is preferred with insurance plan and patient is satisfied with pharmacy services Patient decided to: Continue current medication management strategy  Care Plan and Follow Up Patient Decision:  Patient agrees to Care Plan and Follow-up.  Plan: Telephone follow up appointment with care management team member scheduled for:  2 months  MJeni Salles PharmD BWest LivingstonPharmacist LOleanat [place]ond [phone]

## 2020-11-02 ENCOUNTER — Telehealth: Payer: Self-pay | Admitting: Pharmacist

## 2020-11-02 NOTE — Progress Notes (Signed)
Chronic Care Management Pharmacy Assistant   Name: Cheyenne Guzman  MRN: [id number] DOB: 1965/12/25  Reason for Encounter: Patient assistance for Humulin Mailed to patient 11/04/2020    Medications: Outpatient Encounter Medications as of 11/02/2020  Medication Sig Note   atorvastatin (LIPITOR) 10 MG tablet TAKE 1 TABLET BY MOUTH EVERY DAY    albuterol (PROVENTIL) (2.5 MG/3ML) 0.083% nebulizer solution 1 VIAL IN NEBULIZER EVERY 4 HOURS AS NEEDED FOR WHEEZING    albuterol (VENTOLIN HFA) 108 (90 Base) MCG/ACT inhaler Inhale 2 puffs into the lungs every 4 (four) hours as needed for wheezing or shortness of breath.    Blood Glucose Monitoring Suppl (CONTOUR NEXT MONITOR) w/Device KIT 1 each by Does not apply route 2 (two) times daily. To check blood sugars twice a day.    Buprenorphine 15 MCG/HR PTWK Place 1 patch onto the skin every Monday. 09/19/2020: #4/28 days [facility] - current patch is on upper right arm   cefadroxil (DURICEF) 500 MG capsule Take 1 capsule (500 mg total) by mouth 2 (two) times daily. Take at first signs of recurrent cellulitis    Cholecalciferol (VITAMIN D3) 50 MCG (2000 UT) TABS Take 2,000 Units by mouth every morning.    furosemide (LASIX) 40 MG tablet TAKE 1 TABLET BY MOUTH TWICE A DAY (Patient taking differently: Take 40 mg by mouth See admin instructions. Take one tablet (40 mg) by mouth daily when not going out - approximately 5 times week)    gabapentin (NEURONTIN) 100 MG capsule Take 100 mg by mouth 2 (two) times daily. Take with a 300 mg capsule for a total dose of 400 mg twice daily    gabapentin (NEURONTIN) 300 MG capsule Take 300 mg by mouth 2 (two) times daily. Take with a 100 mg capsule for a total dose of 400 mg twice daily    glucose blood (CONTOUR NEXT TEST) test strip Use to test blood sugar 2 times daily    glucose blood test strip Use as instructed    Insulin Pen Needle 31G X 8 MM MISC 1 Device by Does not apply route 3 (three)  times daily.    insulin regular human CONCENTRATED (HUMULIN R U-500 KWIKPEN) 500 UNIT/ML KwikPen Inject 80 Units into the skin 3 (three) times daily with meals.    Insulin Syringe-Needle U-100 (INSULIN SYRINGE 1CC/31GX5/16") 31G X 5/16" 1 ML MISC 2x daily    levothyroxine (SYNTHROID) 75 MCG tablet TAKE 1 TABLET BY MOUTH EVERY DAY (Patient taking differently: Take 75 mcg by mouth daily before breakfast.)    lidocaine (XYLOCAINE) 5 % ointment Apply 1 application topically 2 (two) times daily as needed (pain).    metFORMIN (GLUCOPHAGE) 1000 MG tablet TAKE 1 TABLET BY MOUTH EVERY DAY WITH BREAKFAST (Patient taking differently: Take 1,000 mg by mouth every morning.)    methocarbamol (ROBAXIN) 750 MG tablet Take 1 tablet (750 mg total) by mouth 2 (two) times daily. (Patient taking differently: Take 750 mg by mouth every morning.)    Multiple Vitamin (MULTIVITAMIN WITH MINERALS) TABS tablet Take 1 tablet by mouth daily.    Multiple Vitamins-Minerals (AIRBORNE GUMMIES) CHEW Chew 3 tablets by mouth every morning.    ondansetron (ZOFRAN) 4 MG tablet Take 1 tablet (4 mg total) by mouth as directed. Take one Zofran pill 30-60 minutes before each colonoscopy prep dose 10/04/2020: Before procedure    pramipexole (MIRAPEX) 0.5 MG tablet TAKE 1 TABLET BY MOUTH EVERYDAY AT BEDTIME (Patient taking differently:  Take 0.5 mg by mouth at bedtime. For restless legs)    sertraline (ZOLOFT) 100 MG tablet TAKE 2 TABLETS (200 MG TOTAL) BY MOUTH AT BEDTIME. (Patient taking differently: Take 200 mg by mouth every morning.)    XARELTO 20 MG TABS tablet TAKE 1 TABLET BY MOUTH EVERY DAY    No facility-administered encounter medications on file as of 11/02/2020.    Care Gaps: AWV - scheduled for 02/13/2021 Eye Exam - never done Hep C screen - never done TDAP - never done Shingrix - never done Pap Smear - never done Foot Exam  - overdue Urine Microalbumin - overdue Covid vaccine booster 4 - overdue Flu - due Last BP  reading - 142/80 on 10/14/2021 Last HGA1C - 10.6 on 09/20/2020  Star Rating Drugs: Atorvastatin 10 mg - [facility] Metformin 1000 mg - last filled 09/19/2020 90[facility] at [place] Pharmacist Assistant [phone]

## 2020-11-16 DIAGNOSIS — G4733 Obstructive sleep apnea (adult) (pediatric): Secondary | ICD-10-CM | POA: Diagnosis not present

## 2020-11-17 ENCOUNTER — Telehealth: Payer: Self-pay | Admitting: Pharmacist

## 2020-11-17 NOTE — Chronic Care Management (AMB) (Signed)
Chronic Care Management Pharmacy Assistant   Name: Cheyenne Guzman  MRN: [id number] DOB: 06-Jul-1965  Reason for Encounter: Follow up on patient assistance for Humulin. Patient assistance form mailed to patient on 11/04/2020. Spoke with patient, she has received the form and still working on getting the information together, when she completed this she will get the form to Dr. Kelton Pillar.    Medications: Outpatient Encounter Medications as of 11/17/2020  Medication Sig Note   atorvastatin (LIPITOR) 10 MG tablet TAKE 1 TABLET BY MOUTH EVERY DAY    albuterol (PROVENTIL) (2.5 MG/3ML) 0.083% nebulizer solution 1 VIAL IN NEBULIZER EVERY 4 HOURS AS NEEDED FOR WHEEZING    albuterol (VENTOLIN HFA) 108 (90 Base) MCG/ACT inhaler Inhale 2 puffs into the lungs every 4 (four) hours as needed for wheezing or shortness of breath.    Blood Glucose Monitoring Suppl (CONTOUR NEXT MONITOR) w/Device KIT 1 each by Does not apply route 2 (two) times daily. To check blood sugars twice a day.    Buprenorphine 15 MCG/HR PTWK Place 1 patch onto the skin every Monday. 09/19/2020: #4/28 days [facility] - current patch is on upper right arm   cefadroxil (DURICEF) 500 MG capsule Take 1 capsule (500 mg total) by mouth 2 (two) times daily. Take at first signs of recurrent cellulitis    Cholecalciferol (VITAMIN D3) 50 MCG (2000 UT) TABS Take 2,000 Units by mouth every morning.    furosemide (LASIX) 40 MG tablet TAKE 1 TABLET BY MOUTH TWICE A DAY (Patient taking differently: Take 40 mg by mouth See admin instructions. Take one tablet (40 mg) by mouth daily when not going out - approximately 5 times week)    gabapentin (NEURONTIN) 100 MG capsule Take 100 mg by mouth 2 (two) times daily. Take with a 300 mg capsule for a total dose of 400 mg twice daily    gabapentin (NEURONTIN) 300 MG capsule Take 300 mg by mouth 2 (two) times daily. Take with a 100 mg capsule for a total dose of 400 mg twice daily     glucose blood (CONTOUR NEXT TEST) test strip Use to test blood sugar 2 times daily    glucose blood test strip Use as instructed    Insulin Pen Needle 31G X 8 MM MISC 1 Device by Does not apply route 3 (three) times daily.    insulin regular human CONCENTRATED (HUMULIN R U-500 KWIKPEN) 500 UNIT/ML KwikPen Inject 80 Units into the skin 3 (three) times daily with meals.    Insulin Syringe-Needle U-100 (INSULIN SYRINGE 1CC/31GX5/16") 31G X 5/16" 1 ML MISC 2x daily    levothyroxine (SYNTHROID) 75 MCG tablet TAKE 1 TABLET BY MOUTH EVERY DAY (Patient taking differently: Take 75 mcg by mouth daily before breakfast.)    lidocaine (XYLOCAINE) 5 % ointment Apply 1 application topically 2 (two) times daily as needed (pain).    metFORMIN (GLUCOPHAGE) 1000 MG tablet TAKE 1 TABLET BY MOUTH EVERY DAY WITH BREAKFAST (Patient taking differently: Take 1,000 mg by mouth every morning.)    methocarbamol (ROBAXIN) 750 MG tablet Take 1 tablet (750 mg total) by mouth 2 (two) times daily. (Patient taking differently: Take 750 mg by mouth every morning.)    Multiple Vitamin (MULTIVITAMIN WITH MINERALS) TABS tablet Take 1 tablet by mouth daily.    Multiple Vitamins-Minerals (AIRBORNE GUMMIES) CHEW Chew 3 tablets by mouth every morning.    ondansetron (ZOFRAN) 4 MG tablet Take 1 tablet (4 mg total) by mouth  as directed. Take one Zofran pill 30-60 minutes before each colonoscopy prep dose 10/04/2020: Before procedure    pramipexole (MIRAPEX) 0.5 MG tablet TAKE 1 TABLET BY MOUTH EVERYDAY AT BEDTIME (Patient taking differently: Take 0.5 mg by mouth at bedtime. For restless legs)    sertraline (ZOLOFT) 100 MG tablet TAKE 2 TABLETS (200 MG TOTAL) BY MOUTH AT BEDTIME. (Patient taking differently: Take 200 mg by mouth every morning.)    XARELTO 20 MG TABS tablet TAKE 1 TABLET BY MOUTH EVERY DAY    No facility-administered encounter medications on file as of 11/17/2020.    Care Gaps: AWV - scheduled for 02/13/2021 Eye Exam - never  done Hep C screen - never done TDAP - never done Shingrix - never done Pap Smear - never done Foot Exam  - overdue Urine Microalbumin - overdue Covid vaccine booster 4 - overdue Flu - due Last BP reading - 142/80 on 10/14/2021 Last HGA1C - 10.6 on 09/20/2020  Star Rating Drugs: Atorvastatin 10 mg - [facility] Metformin 1000 mg - last filled 09/19/2020 90DS at [place] Pharmacist Assistant [phone]

## 2020-11-21 DIAGNOSIS — E785 Hyperlipidemia, unspecified: Secondary | ICD-10-CM

## 2020-11-21 DIAGNOSIS — E1165 Type 2 diabetes mellitus with hyperglycemia: Secondary | ICD-10-CM

## 2020-11-21 DIAGNOSIS — Z794 Long term (current) use of insulin: Secondary | ICD-10-CM | POA: Diagnosis not present

## 2020-12-01 DIAGNOSIS — G8929 Other chronic pain: Secondary | ICD-10-CM | POA: Diagnosis not present

## 2020-12-01 DIAGNOSIS — Z79899 Other long term (current) drug therapy: Secondary | ICD-10-CM | POA: Diagnosis not present

## 2020-12-01 DIAGNOSIS — M5441 Lumbago with sciatica, right side: Secondary | ICD-10-CM | POA: Diagnosis not present

## 2020-12-01 DIAGNOSIS — M961 Postlaminectomy syndrome, not elsewhere classified: Secondary | ICD-10-CM | POA: Diagnosis not present

## 2020-12-01 DIAGNOSIS — M7918 Myalgia, other site: Secondary | ICD-10-CM | POA: Diagnosis not present

## 2020-12-01 DIAGNOSIS — M5442 Lumbago with sciatica, left side: Secondary | ICD-10-CM | POA: Diagnosis not present

## 2020-12-03 ENCOUNTER — Other Ambulatory Visit: Payer: Self-pay | Admitting: Family Medicine

## 2020-12-03 ENCOUNTER — Other Ambulatory Visit: Payer: Self-pay | Admitting: Internal Medicine

## 2020-12-04 ENCOUNTER — Encounter: Payer: Self-pay | Admitting: Family Medicine

## 2020-12-05 ENCOUNTER — Other Ambulatory Visit: Payer: Self-pay

## 2020-12-05 MED ORDER — CIPROFLOXACIN HCL 500 MG PO TABS: 500.0000 mg | ORAL_TABLET | Freq: Two times a day (BID) | ORAL | 0 refills | Status: AC

## 2020-12-05 NOTE — Telephone Encounter (Signed)
Azo helps with the burning but it does not offer any type of protection against future infections. Please call in Cipro 500 mg BID for 7 days

## 2020-12-07 ENCOUNTER — Ambulatory Visit: Payer: Medicare Other | Admitting: Infectious Disease

## 2020-12-17 DIAGNOSIS — G4733 Obstructive sleep apnea (adult) (pediatric): Secondary | ICD-10-CM | POA: Diagnosis not present

## 2020-12-21 ENCOUNTER — Telehealth: Payer: Self-pay | Admitting: Pharmacist

## 2020-12-21 NOTE — Chronic Care Management (AMB) (Signed)
    Chronic Care Management Pharmacy Assistant   Name: Cheyenne Guzman  MRN: [id number] DOB: [birth year]  Reason for Encounter: Patient assistance follow up for Humulin R. Spoke with Caren Griffins at [facility], no record found on this patient.   Care Gaps: AWV - scheduled for 02/13/2021 Eye Exam - never done Hep C screen - never done TDAP - never done Shingrix - never done Pap Smear - never done Foot Exam  - overdue Urine Microalbumin - overdue Covid vaccine booster 4 - overdue Flu - due Last BP reading - 142/80 on 10/14/2021 Last HGA1C - 10.6 on 09/20/2020   Star Rating Drugs: Atorvastatin 10 mg - [facility] Metformin 1000 mg - last filled 09/19/2020 90DS at [place] Pharmacist Assistant [phone]

## 2020-12-29 ENCOUNTER — Telehealth: Payer: Self-pay | Admitting: Pharmacist

## 2020-12-29 NOTE — Chronic Care Management (AMB) (Signed)
Chronic Care Management Pharmacy Assistant   Name: Cheyenne Guzman  MRN: [id number] DOB: 10-10-1965  Reason for Encounter: Disease State / Diabetes Assessment Call   Conditions to be addressed/monitored: DMII   Recent office visits:  None  Recent consult visits:  None  Hospital visits:  None  Medications: Outpatient Encounter Medications as of 12/29/2020  Medication Sig Note   atorvastatin (LIPITOR) 10 MG tablet TAKE 1 TABLET BY MOUTH EVERY DAY    albuterol (PROVENTIL) (2.5 MG/3ML) 0.083% nebulizer solution 1 VIAL IN NEBULIZER EVERY 4 HOURS AS NEEDED FOR WHEEZING    albuterol (VENTOLIN HFA) 108 (90 Base) MCG/ACT inhaler Inhale 2 puffs into the lungs every 4 (four) hours as needed for wheezing or shortness of breath.    Blood Glucose Monitoring Suppl (CONTOUR NEXT MONITOR) w/Device KIT 1 each by Does not apply route 2 (two) times daily. To check blood sugars twice a day.    Buprenorphine 15 MCG/HR PTWK Place 1 patch onto the skin every Monday. 09/19/2020: #4/28 days [facility] - current patch is on upper right arm   cefadroxil (DURICEF) 500 MG capsule Take 1 capsule (500 mg total) by mouth 2 (two) times daily. Take at first signs of recurrent cellulitis    Cholecalciferol (VITAMIN D3) 50 MCG (2000 UT) TABS Take 2,000 Units by mouth every morning.    furosemide (LASIX) 40 MG tablet TAKE 1 TABLET BY MOUTH TWICE A DAY (Patient taking differently: Take 40 mg by mouth See admin instructions. Take one tablet (40 mg) by mouth daily when not going out - approximately 5 times week)    gabapentin (NEURONTIN) 100 MG capsule Take 100 mg by mouth 2 (two) times daily. Take with a 300 mg capsule for a total dose of 400 mg twice daily    gabapentin (NEURONTIN) 300 MG capsule Take 300 mg by mouth 2 (two) times daily. Take with a 100 mg capsule for a total dose of 400 mg twice daily    glucose blood (CONTOUR NEXT TEST) test strip Use to test blood sugar 2 times daily     glucose blood test strip Use as instructed    Insulin Pen Needle 31G X 8 MM MISC 1 Device by Does not apply route 3 (three) times daily.    insulin regular human CONCENTRATED (HUMULIN R U-500 KWIKPEN) 500 UNIT/ML KwikPen Inject 80 Units into the skin 3 (three) times daily with meals.    Insulin Syringe-Needle U-100 (INSULIN SYRINGE 1CC/31GX5/16") 31G X 5/16" 1 ML MISC 2x daily    levothyroxine (SYNTHROID) 75 MCG tablet TAKE 1 TABLET BY MOUTH EVERY DAY (Patient taking differently: Take 75 mcg by mouth daily before breakfast.)    lidocaine (XYLOCAINE) 5 % ointment Apply 1 application topically 2 (two) times daily as needed (pain).    metFORMIN (GLUCOPHAGE) 1000 MG tablet TAKE 1 TABLET BY MOUTH EVERY DAY WITH BREAKFAST    methocarbamol (ROBAXIN) 750 MG tablet Take 1 tablet (750 mg total) by mouth 2 (two) times daily. (Patient taking differently: Take 750 mg by mouth every morning.)    Multiple Vitamin (MULTIVITAMIN WITH MINERALS) TABS tablet Take 1 tablet by mouth daily.    Multiple Vitamins-Minerals (AIRBORNE GUMMIES) CHEW Chew 3 tablets by mouth every morning.    ondansetron (ZOFRAN) 4 MG tablet Take 1 tablet (4 mg total) by mouth as directed. Take one Zofran pill 30-60 minutes before each colonoscopy prep dose 10/04/2020: Before procedure    pramipexole (MIRAPEX) 0.5 MG tablet  TAKE 1 TABLET BY MOUTH EVERYDAY AT BEDTIME    sertraline (ZOLOFT) 100 MG tablet TAKE 2 TABLETS (200 MG TOTAL) BY MOUTH AT BEDTIME. (Patient taking differently: Take 200 mg by mouth every morning.)    XARELTO 20 MG TABS tablet TAKE 1 TABLET BY MOUTH EVERY DAY    No facility-administered encounter medications on file as of 12/29/2020.  Fill History: GABAPENTIN 400 MG CAPSULE 12/01/2020 30   HUMULIN R 500 UNIT/ML KWIKPEN 12/08/2020 25   LEVOTHYROXINE 75 MCG TABLET 11/16/2020 90   METHOCARBAMOL 750 MG TABLET 12/11/2020 30   PRAMIPEXOLE 0.5 MG TABLET 12/05/2020 90   XARELTO 20 MG TABLET 12/04/2020 30   SERTRALINE HCL 100  MG TABLET 11/16/2020 90   METFORMIN HCL 1,000 MG TABLET 12/05/2020 90   Recent Relevant Labs: Lab Results  Component Value Date/Time   HGBA1C 10.6 (H) 09/20/2020 07:24 AM   HGBA1C 9.2 (H) 02/19/2020 04:11 PM   MICROALBUR 15.7 (H) 11/05/2018 04:11 PM   MICROALBUR 1.5 04/14/2009 11:39 AM    Kidney Function Lab Results  Component Value Date/Time   CREATININE 0.92 09/23/2020 05:27 AM   CREATININE 1.03 (H) 09/22/2020 05:20 AM   CREATININE 0.92 02/19/2020 04:11 PM   CREATININE 0.93 02/01/2012 01:17 PM   GFR 56.49 (L) 11/05/2018 04:11 PM   GFRNONAA >60 09/23/2020 05:27 AM   GFRAA >60 04/09/2019 01:27 PM   Current antihyperglycemic regimen:   What recent interventions/DTPs have been made to improve glycemic control:    Have there been any recent hospitalizations or ED visits since last visit with CPP?   Patient  hypoglycemic symptoms, including   Patient  hyperglycemic symptoms, including   How often are you checking your blood sugar?   What are your blood sugars ranging?  Fasting:  Before meals:  After meals:  Bedtime:   During the week, how often does your blood glucose drop below 70?   Are you checking your feet daily/regularly?   Adherence Review: Is the patient currently on a STATIN medication?  Is the patient currently on ACE/ARB medication?  Does the patient have >5 day gap between last estimated fill dates?   Unable to reach patient after several attempts  Care Gaps: AWV - scheduled for 02/13/2021 Last BP - 142/80 on 10/14/2020 Last A1C - 10.6 on 09/20/2020 Eye exam - never done Hep C Screen - never done TDAP - never done Shingrix - never done Pap smear - never done Pneumovax - overdue Foot exam - overdue Urine Malb - overdue Covid vaccine - overdue Flu - due  Star Rating Drugs: Atorvastatin 10 mg - [facility] Metformin 1000 mg - last filled 12/05/2020 90 DS at [facility] [phone]

## 2021-01-04 DIAGNOSIS — G4733 Obstructive sleep apnea (adult) (pediatric): Secondary | ICD-10-CM | POA: Diagnosis not present

## 2021-01-10 ENCOUNTER — Other Ambulatory Visit: Payer: Self-pay | Admitting: Internal Medicine

## 2021-01-10 DIAGNOSIS — E785 Hyperlipidemia, unspecified: Secondary | ICD-10-CM

## 2021-01-16 DIAGNOSIS — G4733 Obstructive sleep apnea (adult) (pediatric): Secondary | ICD-10-CM | POA: Diagnosis not present

## 2021-01-18 ENCOUNTER — Telehealth: Payer: Self-pay | Admitting: Pharmacist

## 2021-01-18 NOTE — Chronic Care Management (AMB) (Signed)
° ° °  Chronic Care Management Pharmacy Assistant   Name: PRAGYA LOFASO  MRN: [id number] DOB: Jun 01, 1965  Reason for Encounter: Follow up patient assistance for Humulin R.  12/21/2020 I spoke with Caren Griffins at [facility] and no record was found on this patient.   Unable to reach [facility] due to extreme wait times and unable to reach patient after several attempts.    Care Gaps: AWV - scheduled for 02/13/2021 Last BP - 142/80 on 10/14/2020 Last A1C - 10.6 on 09/20/2020 Eye exam - never done Hep C Screen - never done TDAP - never done Shingrix - never done Pap smear - never done Pneumovax - overdue Foot exam - overdue Urine Malb - overdue Covid vaccine - overdue Flu - due  Star Rating Drugs: Atorvastatin 10 mg - last [facility] Metformin 1000 mg - last filled 12/05/2020 90 DS at [place] [phone]

## 2021-01-19 ENCOUNTER — Encounter: Payer: Self-pay | Admitting: Family Medicine

## 2021-01-20 NOTE — Telephone Encounter (Signed)
She can take a week of the Cephadroxil and follow up if she doesn't get better

## 2021-02-13 ENCOUNTER — Ambulatory Visit: Payer: Medicare Other | Admitting: Internal Medicine

## 2021-02-13 ENCOUNTER — Telehealth: Payer: Self-pay

## 2021-02-13 NOTE — Progress Notes (Signed)
This encounter was created in error - please disregard.

## 2021-02-13 NOTE — Telephone Encounter (Signed)
Unsuccessful attempt to reach patient for scheduled AVV. On preferred number listed in notes. Left message on voicemail okay to reschedule

## 2021-02-13 NOTE — Progress Notes (Deleted)
, °

## 2021-02-15 ENCOUNTER — Encounter: Payer: Self-pay | Admitting: Infectious Disease

## 2021-02-16 DIAGNOSIS — G4733 Obstructive sleep apnea (adult) (pediatric): Secondary | ICD-10-CM | POA: Diagnosis not present

## 2021-02-17 ENCOUNTER — Ambulatory Visit: Payer: Medicare Other | Admitting: Infectious Disease

## 2021-02-25 ENCOUNTER — Other Ambulatory Visit: Payer: Self-pay | Admitting: Family Medicine

## 2021-02-25 ENCOUNTER — Other Ambulatory Visit: Payer: Self-pay | Admitting: Internal Medicine

## 2021-03-02 DIAGNOSIS — G8929 Other chronic pain: Secondary | ICD-10-CM | POA: Diagnosis not present

## 2021-03-02 DIAGNOSIS — M5442 Lumbago with sciatica, left side: Secondary | ICD-10-CM | POA: Diagnosis not present

## 2021-03-02 DIAGNOSIS — M7918 Myalgia, other site: Secondary | ICD-10-CM | POA: Diagnosis not present

## 2021-03-02 DIAGNOSIS — M5441 Lumbago with sciatica, right side: Secondary | ICD-10-CM | POA: Diagnosis not present

## 2021-03-02 DIAGNOSIS — M961 Postlaminectomy syndrome, not elsewhere classified: Secondary | ICD-10-CM | POA: Diagnosis not present

## 2021-03-19 DIAGNOSIS — G4733 Obstructive sleep apnea (adult) (pediatric): Secondary | ICD-10-CM | POA: Diagnosis not present

## 2021-03-27 ENCOUNTER — Telehealth: Payer: Self-pay | Admitting: Pharmacist

## 2021-03-27 NOTE — Chronic Care Management (AMB) (Signed)
? ? ?Chronic Care Management ?Pharmacy Assistant  ? ?Name: Cheyenne Guzman  MRN: [id number] DOB: 1965/03/28 ? ?Reason for Encounter: Disease State / Diabetes Assessment Call ?  ?Conditions to be addressed/monitored: ?DMII ? ? ?Recent office visits:  ?None ? ?Recent consult visits:  ?None ? ?Hospital visits:  ?None ? ?Medications: ?Outpatient Encounter Medications as of 03/27/2021  ?Medication Sig Note  ? albuterol (PROVENTIL) (2.5 MG/3ML) 0.083% nebulizer solution 1 VIAL IN NEBULIZER EVERY 4 HOURS AS NEEDED FOR WHEEZING   ? albuterol (VENTOLIN HFA) 108 (90 Base) MCG/ACT inhaler Inhale 2 puffs into the lungs every 4 (four) hours as needed for wheezing or shortness of breath.   ? atorvastatin (LIPITOR) 10 MG tablet TAKE 1 TABLET BY MOUTH EVERY DAY   ? Blood Glucose Monitoring Suppl (CONTOUR NEXT MONITOR) w/Device KIT 1 each by Does not apply route 2 (two) times daily. To check blood sugars twice a day.   ? Buprenorphine 15 MCG/HR PTWK Place 1 patch onto the skin every Monday. 09/19/2020: #4/28 days [facility] - current patch is on upper right arm  ? cefadroxil (DURICEF) 500 MG capsule Take 1 capsule (500 mg total) by mouth 2 (two) times daily. Take at first signs of recurrent cellulitis   ? Cholecalciferol (VITAMIN D3) 50 MCG (2000 UT) TABS Take 2,000 Units by mouth every morning.   ? furosemide (LASIX) 40 MG tablet TAKE 1 TABLET BY MOUTH TWICE A DAY (Patient taking differently: Take 40 mg by mouth See admin instructions. Take one tablet (40 mg) by mouth daily when not going out - approximately 5 times week)   ? gabapentin (NEURONTIN) 100 MG capsule Take 100 mg by mouth 2 (two) times daily. Take with a 300 mg capsule for a total dose of 400 mg twice daily   ? gabapentin (NEURONTIN) 300 MG capsule Take 300 mg by mouth 2 (two) times daily. Take with a 100 mg capsule for a total dose of 400 mg twice daily   ? glucose blood (CONTOUR NEXT TEST) test strip Use to test blood sugar 2 times daily   ? glucose  blood test strip Use as instructed   ? Insulin Pen Needle 31G X 8 MM MISC 1 Device by Does not apply route 3 (three) times daily.   ? insulin regular human CONCENTRATED (HUMULIN R U-500 KWIKPEN) 500 UNIT/ML KwikPen Inject 80 Units into the skin 3 (three) times daily with meals.   ? Insulin Syringe-Needle U-100 (INSULIN SYRINGE 1CC/31GX5/16") 31G X 5/16" 1 ML MISC 2x daily   ? levothyroxine (SYNTHROID) 75 MCG tablet TAKE 1 TABLET BY MOUTH EVERY DAY (Patient taking differently: Take 75 mcg by mouth daily before breakfast.)   ? lidocaine (XYLOCAINE) 5 % ointment Apply 1 application topically 2 (two) times daily as needed (pain).   ? metFORMIN (GLUCOPHAGE) 1000 MG tablet TAKE 1 TABLET BY MOUTH EVERY DAY WITH BREAKFAST   ? methocarbamol (ROBAXIN) 750 MG tablet Take 1 tablet (750 mg total) by mouth 2 (two) times daily. (Patient taking differently: Take 750 mg by mouth every morning.)   ? Multiple Vitamin (MULTIVITAMIN WITH MINERALS) TABS tablet Take 1 tablet by mouth daily.   ? Multiple Vitamins-Minerals (AIRBORNE GUMMIES) CHEW Chew 3 tablets by mouth every morning.   ? ondansetron (ZOFRAN) 4 MG tablet Take 1 tablet (4 mg total) by mouth as directed. Take one Zofran pill 30-60 minutes before each colonoscopy prep dose 10/04/2020: Before procedure   ? pramipexole (MIRAPEX) 0.5 MG tablet  TAKE 1 TABLET BY MOUTH EVERYDAY AT BEDTIME   ? sertraline (ZOLOFT) 100 MG tablet TAKE 2 TABLETS (200 MG TOTAL) BY MOUTH AT BEDTIME. (Patient taking differently: Take 200 mg by mouth every morning.)   ? XARELTO 20 MG TABS tablet TAKE 1 TABLET BY MOUTH EVERY DAY   ? ?No facility-administered encounter medications on file as of 03/27/2021.  ?Fill History: ?ATORVASTATIN 10 MG TABLET 01/10/2021 90  ? ?BUPRENORPHINE 15 MCG/HR PATCH 02/06/2021 28  ? ?GABAPENTIN 400 MG CAPSULE 02/03/2021 30  ? ?HUMULIN R 500 UNIT/ML [facility] 01/29/2021 25  ? ?LEVOTHYROXINE 75 MCG TABLET 02/11/2021 90  ? ?LIDOCAINE 5% OINTMENT 01/18/2021 30  ? ?METHOCARBAMOL  750 MG  TABS 01/08/2021 30  ? ?PRAMIPEXOLE 0.5 MG TABLET 12/05/2020 90  ? ?XARELTO 20 MG TABLET 01/30/2021 30  ? ?SERTRALINE HCL 100 MG TABLET 02/11/2021 90  ? ?METFORMIN HCL 1,000 MG TABLET 12/05/2020 90  ? ?Recent Relevant Labs: ?Lab Results  ?Component Value Date/Time  ? HGBA1C 10.6 (H) 09/20/2020 07:24 AM  ? HGBA1C 9.2 (H) 02/19/2020 04:11 PM  ? MICROALBUR 15.7 (H) 11/05/2018 04:11 PM  ? MICROALBUR 1.5 04/14/2009 11:39 AM  ?  ?Kidney Function ?Lab Results  ?Component Value Date/Time  ? CREATININE 0.92 09/23/2020 05:27 AM  ? CREATININE 1.03 (H) 09/22/2020 05:20 AM  ? CREATININE 0.92 02/19/2020 04:11 PM  ? CREATININE 0.93 02/01/2012 01:17 PM  ? GFR 56.49 (L) 11/05/2018 04:11 PM  ? GFRNONAA >60 09/23/2020 05:27 AM  ? GFRAA >60 04/09/2019 01:27 PM  ? ? ?Current antihyperglycemic regimen:  ?Humulin R kwikpen Inject 80 Units into the skin 3 (three) times daily with meals ?Metformin 1000 mg 1 tablet daily with breakfast ? ?What recent interventions/DTPs have been made to improve glycemic control:  No recent interventions ? ?Have there been any recent hospitalizations or ED visits since last visit with CPP? No recent hospital visits.  ? ?Patient reports hypoglycemic symptoms, including Shaky ? ?Patient reports hyperglycemic symptoms, including blurry vision, fatigue.  ? ?How often are you checking your blood sugar? Patient is checking some days 3 times per week and some days 2 times per week ? ?What are your blood sugars ranging? Patient states her reading are generally in this range, her last reading were: ?Fasting: 229, 187, 213 ?After meals: 217, 164, 187 ? ?During the week, how often does your blood glucose drop below 70?  On occasion, she is able to eat and bring it back up.  ? ?Are you checking your feet daily/regularly? Patient is checking feet regular. ? ?Adherence Review: ?Is the patient currently on a STATIN medication? Yes ?Is the patient currently on ACE/ARB medication? No ?Does the patient have >5 day gap between  last estimated fill dates? No ? ? ?Care Gaps: ?AWV - scheduled for 02/13/2021 ?Last BP - 142/80 on 10/14/2020 ?Last A1C - 10.6 on 09/20/2020 ?Eye exam - never done ?Hep C Screen - never done ?TDAP - never done ?Shingrix - never done ?Pap smear - never done ?Foot exam - overdue ?Urine Malb - overdue ?Covid vaccine - overdue ?Flu - overdue ?HGA1C - overdue ?  ?Star Rating Drugs: ?Atorvastatin 10 mg - [facility] ?Metformin 1000 mg - [facility] ? ?Gennie Alma CMA  ?Clinical Pharmacist Assistant ?[phone] ? ?

## 2021-03-30 ENCOUNTER — Telehealth: Payer: Self-pay | Admitting: Family Medicine

## 2021-03-30 NOTE — Telephone Encounter (Signed)
Left message for patient to call back and schedule Medicare Annual Wellness Visit (AWV) either virtually or in office. Left  my Herbie Drape number [phone] ? ? ?Last AWV 02/12/20 ? please schedule at anytime with [facility] Nurse Health Advisor 1 or 2 ? ? ?This should be a 45 minute visit.  ?

## 2021-04-03 ENCOUNTER — Telehealth: Payer: Self-pay | Admitting: Pharmacist

## 2021-04-03 NOTE — Chronic Care Management (AMB) (Unsigned)
° ° °  Chronic Care Management Pharmacy Assistant   Name: Cheyenne Guzman  MRN: [id number] DOB: 08/15/1965.  Reason for Encounter: Reschedule July appointment to March or April.   [facility] Pharmacist Assistant [phone]

## 2021-04-05 ENCOUNTER — Other Ambulatory Visit: Payer: Self-pay

## 2021-04-05 ENCOUNTER — Encounter: Payer: Self-pay | Admitting: Family Medicine

## 2021-04-05 MED ORDER — ALBUTEROL SULFATE HFA 108 (90 BASE) MCG/ACT IN AERS: 2.0000 | INHALATION_SPRAY | RESPIRATORY_TRACT | 1 refills | Status: DC | PRN

## 2021-04-17 ENCOUNTER — Other Ambulatory Visit: Payer: Self-pay | Admitting: Internal Medicine

## 2021-04-17 DIAGNOSIS — E785 Hyperlipidemia, unspecified: Secondary | ICD-10-CM

## 2021-04-24 ENCOUNTER — Other Ambulatory Visit: Payer: Self-pay | Admitting: Internal Medicine

## 2021-04-28 ENCOUNTER — Other Ambulatory Visit: Payer: Self-pay | Admitting: Family Medicine

## 2021-05-02 ENCOUNTER — Telehealth: Payer: Self-pay | Admitting: Family Medicine

## 2021-05-02 NOTE — Telephone Encounter (Signed)
Left message for patient to call back and schedule Medicare Annual Wellness Visit (AWV) either virtually or in office. Left  my jabber number [phone]   Last AWV ;02/12/20  please schedule at anytime with [facility] Nurse Health Advisor 1 or 2    

## 2021-05-11 ENCOUNTER — Ambulatory Visit (INDEPENDENT_AMBULATORY_CARE_PROVIDER_SITE_OTHER): Payer: Medicare Other | Admitting: Internal Medicine

## 2021-05-11 ENCOUNTER — Encounter: Payer: Self-pay | Admitting: Internal Medicine

## 2021-05-11 VITALS — BP 134/86 | HR 94 | Ht 67.0 in | Wt >= 6400 oz

## 2021-05-11 DIAGNOSIS — R739 Hyperglycemia, unspecified: Secondary | ICD-10-CM

## 2021-05-11 DIAGNOSIS — Z794 Long term (current) use of insulin: Secondary | ICD-10-CM

## 2021-05-11 DIAGNOSIS — E1142 Type 2 diabetes mellitus with diabetic polyneuropathy: Secondary | ICD-10-CM

## 2021-05-11 DIAGNOSIS — E1165 Type 2 diabetes mellitus with hyperglycemia: Secondary | ICD-10-CM | POA: Diagnosis not present

## 2021-05-11 DIAGNOSIS — Z79899 Other long term (current) drug therapy: Secondary | ICD-10-CM | POA: Diagnosis not present

## 2021-05-11 LAB — POCT GLYCOSYLATED HEMOGLOBIN (HGB A1C): Hemoglobin A1C: 9.6 % — AB (ref 4.0–5.6)

## 2021-05-11 LAB — POCT GLUCOSE (DEVICE FOR HOME USE): Glucose Fasting, POC: 235 mg/dL — AB (ref 70–99)

## 2021-05-11 MED ORDER — HUMULIN R U-500 KWIKPEN 500 UNIT/ML ~~LOC~~ SOPN: 80.0000 [IU] | PEN_INJECTOR | SUBCUTANEOUS | 6 refills | Status: DC

## 2021-05-11 MED ORDER — INSULIN PEN NEEDLE 31G X 8 MM MISC: 1.0000 | Freq: Three times a day (TID) | 3 refills | Status: DC

## 2021-05-11 NOTE — Progress Notes (Signed)
? ?Name: Cheyenne Guzman  ?Age/ Sex: 56 y.o., female   ?MRN/ DOB: [id number], [birth year]    ? ?PCP: Laurey Morale, MD   ?Reason for Endocrinology Evaluation: Type 2 Diabetes Mellitus  ?Initial Endocrine Consultative Visit: 08/06/2016  ? ? ?PATIENT IDENTIFIER: Cheyenne Guzman is a 56 y.o. female with a past medical history of T2DM, HTN, OSA. The patient has followed with Endocrinology clinic since 08/06/2016 for consultative assistance with management of her diabetes. ? ?DIABETIC HISTORY:  ?Ms. Zehring was diagnosed with T2DM in 2000. Has been on Metformin without side effects  .Has been on insulin since 2009. Her hemoglobin A1c has ranged from 8.2% in 2017, peaking at 10.3% in 2019. ? ?Re-established with me 08/2018. Stopped lantus and started Novolog mix as well as metformin.  ? ?Attempted to prescribe Ozempic in 2021 but it was cost prohibitive ? ?By September 2022 we will switch Humalog mix to Humulin U-500 ? ?SUBJECTIVE:  ? ?Today (05/11/2021): Cheyenne Guzman is here for a follow up on diabetes management . She has not been to our clinic in 7 months.  She is accompanied by her daughter [name]. She checks her blood sugars twice daily . She did not bring meter today, but had her glucose reading written in the phone. ? ? ?Has occasional nausea, denies vomiting or diarrhea  ?Has occasional heartburn if she eats at night  ?She is in the donut hole  ?She has been taking 90 units of U-500 instead of 70 units . She woke up with 84 mg/dL drank soda and ate peanut butter with an in office BG 235 mg.dL  ? ?HOME REGIMEN:  ?Metformin 500 mg, with breakfast  ?Humulin U-500 , 80 units with breakfast, 80 units with lunch, and 70 units with supper ?Lipitor 10 mg daily  ? ? ?Statin: yes ?ACE-I/ARB: no ?Prior Diabetic Education: Yes ? ? ?GLUCOSE: did not bring  ?108- 327 ? ? ?DIABETIC COMPLICATIONS: ?Microvascular complications:  ?Neuropathy, ? Mild retinopathy  ?Denies: CKD ?Last Eye Exam: Completed 11/2020 ? ?Macrovascular  complications:  ?TIA ?Denies: CAD, CVA, PVD ? ? ?HISTORY:  ?Past Medical History:  ?Past Medical History:  ?Diagnosis Date  ? Anxiety   ? per pt. - mild   ? Arthritis   ? back & ankles   ? Asthma   ? seasonal   ? Breast cancer ([place])   ? Burning sensation of mouth 10/07/2020  ? Cancer [facility])   ? breast, sees Dr. Jana Hakim , Left - 11/2007  ? [place] vaginitis 10/07/2020  ? Cellulitis 2020  ? severe - Left leg   ? Complication of anesthesia 2013  ? severe muscle spasms-sore-no doc  ? Depression   ? Diabetes mellitus   ? sees Dr. Chalmers Cater, diagnosed 2003  ? HA (headache)   ? Hearing loss   ? Went to [facility],Dr Laing,both ears  ? Hyperlipidemia   ? Kidney infection   ? - hosp. [facility]- septic- 10/2009  ? Low back pain   ? Muscle tear 10/07/2020  ? Peripheral neuropathy   ? R sided numbness- face & jaw  ? Pulmonary embolism ([place]) 2020  ? Sepsis ([place]) 2020  ? Sleep apnea 09/2013  ? severe-just started cpap-doing well  ? Sore throat 10/07/2020  ? TIA (transient ischemic attack)   ? in 2007,2008, and 2011  ? Tinnitus of right ear   ? Urosepsis 08/2009  ? with Klebsiella  ? ?Past Surgical History:  ?Past Surgical History:  ?  Procedure Laterality Date  ? ABDOMINAL HYSTERECTOMY  08/2004  ? BREAST BIOPSY  06/22/2011  ? Procedure: BREAST BIOPSY;  Surgeon: Stark Klein, MD;  Location: [place];  Service: General;  Laterality: Right;  ? BREAST LUMPECTOMY    ? BREAST SURGERY  11/2007  ? left ductal carcinoma in situ  ? CARPAL TUNNEL RELEASE Right 10/13/2013  ? Procedure: RIGHT CARPAL TUNNEL RELEASE;  Surgeon: Hessie Dibble, MD;  Location: [place];  Service: Orthopedics;  Laterality: Right;  ? CARPAL TUNNEL RELEASE Left 11/24/2013  ? Procedure: LEFT CARPAL TUNNEL RELEASE WITH CYST EXCISION ;  Surgeon: Hessie Dibble, MD;  Location: [place];  Service: Orthopedics;  Laterality: Left;  ? COLONOSCOPY WITH PROPOFOL N/A 10/10/2020  ? Procedure: COLONOSCOPY WITH PROPOFOL;  Surgeon: Yetta Flock, MD;  Location: [facility];  Service: Gastroenterology;  Laterality: N/A;  ? EAR CYST EXCISION Left 11/24/2013  ? Procedure: CYST REMOVAL;  Surgeon: Hessie Dibble, MD;  Location: [place];  Service: Orthopedics;  Laterality: Left;  ? FOOT SURGERY  2000  ? plantar fasciitis-left  ? [place] SURGERY  03-29-14  ? Fusion, revision 04-27-14 L4-5 Cauda Equina with graft  ? NERVE GRAFT  06-01-08  ? cadaver graft to right inferior alveolar nerve  in [place]  ? POLYPECTOMY  10/10/2020  ? Procedure: POLYPECTOMY;  Surgeon: Yetta Flock, MD;  Location: [facility];  Service: Gastroenterology;;  ? ?Social History:  reports that she quit smoking about 29 years ago. Her smoking use included cigarettes. She has never used smokeless tobacco. She reports that she does not currently use alcohol. She reports that she does not use drugs. ?Family History:  ?Family History  ?Problem Relation Age of Onset  ? Cancer Mother   ?     breast  ? Cancer Father   ?     lung  ? Diabetes Father   ? Colon cancer Sister   ? Cancer Sister 52  ?     colon  ? Diabetes Sister   ? Cancer Maternal Aunt   ?     breast - both  ? Anesthesia problems Neg Hx   ? ? ? ?HOME MEDICATIONS: ?Allergies as of 05/11/2021   ? ?   Reactions  ? Vicodin [hydrocodone-acetaminophen] Nausea And Vomiting  ? ?  ? ?  ?Medication List  ?  ? ?  ? Accurate as of May 11, 2021 10:53 AM. If you have any questions, ask your nurse or doctor.  ?  ?  ? ?  ? ?Airborne Gummies Chew ?Chew 3 tablets by mouth every morning. ?  ?albuterol (2.5 MG/3ML) 0.083% nebulizer solution ?Commonly known as: PROVENTIL ?1 VIAL IN NEBULIZER EVERY 4 HOURS AS NEEDED FOR WHEEZING ?  ?albuterol 108 (90 Base) MCG/ACT inhaler ?Commonly known as: VENTOLIN HFA ?Inhale 2 puffs into the lungs every 4 (four) hours as needed for wheezing or shortness of breath. ?  ?atorvastatin 10 MG tablet ?Commonly known as: LIPITOR ?TAKE 1 TABLET BY MOUTH EVERY DAY ?  ?buprenorphine 15  MCG/HR ?Commonly known as: BUTRANS ?Place 1 patch onto the skin every Monday. ?  ?cefadroxil 500 MG capsule ?Commonly known as: DURICEF ?Take 1 capsule (500 mg total) by mouth 2 (two) times daily. Take at first signs of recurrent cellulitis ?  ?Contour Next Monitor w/Device Kit ?1 each by Does not apply route 2 (two) times daily. To check blood sugars twice a day. ?  ?furosemide  40 MG tablet ?Commonly known as: LASIX ?TAKE 1 TABLET BY MOUTH TWICE A DAY ?What changed:  ?when to take this ?additional instructions ?  ?gabapentin 300 MG capsule ?Commonly known as: NEURONTIN ?Take 300 mg by mouth 2 (two) times daily. Take with a 100 mg capsule for a total dose of 400 mg twice daily ?  ?gabapentin 100 MG capsule ?Commonly known as: NEURONTIN ?Take 100 mg by mouth 2 (two) times daily. Take with a 300 mg capsule for a total dose of 400 mg twice daily ?  ?glucose blood test strip ?Use as instructed ?  ?glucose blood test strip ?Commonly known as: Contour Next Test ?Use to test blood sugar 2 times daily ?  ?HumuLIN R U-500 KwikPen 500 UNIT/ML KwikPen ?Generic drug: insulin regular human CONCENTRATED ?INJECT 80 UNITS INTO THE SKIN 3 (THREE) TIMES DAILY WITH MEALS. ?  ?Insulin Pen Needle 31G X 8 MM Misc ?1 Device by Does not apply route 3 (three) times daily. ?  ?INSULIN SYRINGE 1CC/31GX5/16" 31G X 5/16" 1 ML Misc ?2x daily ?  ?levothyroxine 75 MCG tablet ?Commonly known as: SYNTHROID ?TAKE 1 TABLET BY MOUTH EVERY DAY ?What changed: when to take this ?  ?lidocaine 5 % ointment ?Commonly known as: XYLOCAINE ?Apply 1 application topically 2 (two) times daily as needed (pain). ?  ?metFORMIN 1000 MG tablet ?Commonly known as: GLUCOPHAGE ?TAKE 1 TABLET BY MOUTH EVERY DAY WITH BREAKFAST ?  ?methocarbamol 750 MG tablet ?Commonly known as: ROBAXIN ?Take 1 tablet (750 mg total) by mouth 2 (two) times daily. ?What changed: when to take this ?  ?multivitamin with minerals Tabs tablet ?Take 1 tablet by mouth daily. ?  ?ondansetron 4 MG  tablet ?Commonly known as: ZOFRAN ?Take 1 tablet (4 mg total) by mouth as directed. Take one Zofran pill 30-60 minutes before each colonoscopy prep dose ?  ?pramipexole 0.5 MG tablet ?Commonly known as: MIRAPEX ?TAKE 1 T

## 2021-05-11 NOTE — Patient Instructions (Addendum)
?-   Change U-500 90 units with Breakfast and 90 units with Lunch and 80 units with Supper  ?- Continue Metformin 500 mg, daily  ? ? ? ?HOW TO TREAT LOW BLOOD SUGARS (Blood sugar LESS THAN 70 MG/DL) ?Please follow the RULE OF 15 for the treatment of hypoglycemia treatment (when your (blood sugars are less than 70 mg/dL)  ? ?STEP 1: Take 15 grams of carbohydrates when your blood sugar is low, which includes:  ?3-4 GLUCOSE TABS  OR ?3-4 OZ OF JUICE OR REGULAR SODA OR ?ONE TUBE OF GLUCOSE GEL   ? ?STEP 2: RECHECK blood sugar in 15 MINUTES ?STEP 3: If your blood sugar is still low at the 15 minute recheck --> then, go back to STEP 1 and treat AGAIN with another 15 grams of carbohydrates. ? ?

## 2021-05-12 ENCOUNTER — Other Ambulatory Visit: Payer: Self-pay | Admitting: Internal Medicine

## 2021-05-12 DIAGNOSIS — E785 Hyperlipidemia, unspecified: Secondary | ICD-10-CM

## 2021-05-25 DIAGNOSIS — M5441 Lumbago with sciatica, right side: Secondary | ICD-10-CM | POA: Diagnosis not present

## 2021-05-25 DIAGNOSIS — G8929 Other chronic pain: Secondary | ICD-10-CM | POA: Diagnosis not present

## 2021-05-25 DIAGNOSIS — Z79899 Other long term (current) drug therapy: Secondary | ICD-10-CM | POA: Diagnosis not present

## 2021-05-25 DIAGNOSIS — M7918 Myalgia, other site: Secondary | ICD-10-CM | POA: Diagnosis not present

## 2021-05-25 DIAGNOSIS — M5442 Lumbago with sciatica, left side: Secondary | ICD-10-CM | POA: Diagnosis not present

## 2021-05-26 ENCOUNTER — Telehealth: Payer: Self-pay | Admitting: Family Medicine

## 2021-05-26 NOTE — Telephone Encounter (Signed)
Left message for patient to call back and schedule Medicare Annual Wellness Visit (AWV) either virtually or in office. Left  my Herbie Drape number [phone] ? ? ?Last AWV ;02/12/20 ?please schedule at anytime with [facility] Nurse Health Advisor 1 or 2 ? ? ?Patient also needs appt with pcp last appt 05/19/20 ?

## 2021-05-28 ENCOUNTER — Other Ambulatory Visit: Payer: Self-pay | Admitting: Internal Medicine

## 2021-05-28 ENCOUNTER — Other Ambulatory Visit: Payer: Self-pay | Admitting: Family Medicine

## 2021-05-29 NOTE — Telephone Encounter (Signed)
Pt needs appointment for further refills 

## 2021-06-03 ENCOUNTER — Other Ambulatory Visit: Payer: Self-pay | Admitting: Internal Medicine

## 2021-06-03 DIAGNOSIS — E785 Hyperlipidemia, unspecified: Secondary | ICD-10-CM

## 2021-06-30 ENCOUNTER — Telehealth: Payer: Self-pay | Admitting: Family Medicine

## 2021-06-30 NOTE — Telephone Encounter (Signed)
Left message for patient to call back and schedule Medicare Annual Wellness Visit (AWV) either virtually or in office. Left  my jabber number [phone]   Last AWV ;02/12/20  please schedule at anytime with [facility] Nurse Health Advisor 1 or 2    

## 2021-08-07 ENCOUNTER — Encounter: Payer: Self-pay | Admitting: Family Medicine

## 2021-08-10 ENCOUNTER — Encounter: Payer: Self-pay | Admitting: Family Medicine

## 2021-08-10 ENCOUNTER — Ambulatory Visit (INDEPENDENT_AMBULATORY_CARE_PROVIDER_SITE_OTHER): Payer: Medicare Other | Admitting: Family Medicine

## 2021-08-10 VITALS — BP 128/80 | HR 80 | Temp 98.9°F | Wt >= 6400 oz

## 2021-08-10 DIAGNOSIS — M059 Rheumatoid arthritis with rheumatoid factor, unspecified: Secondary | ICD-10-CM

## 2021-08-10 DIAGNOSIS — M069 Rheumatoid arthritis, unspecified: Secondary | ICD-10-CM | POA: Insufficient documentation

## 2021-08-10 DIAGNOSIS — N6320 Unspecified lump in the left breast, unspecified quadrant: Secondary | ICD-10-CM

## 2021-08-10 NOTE — Progress Notes (Signed)
   Subjective:    Patient ID: Cheyenne Guzman, female    DOB: January 30, 1965, 56 y.o.   MRN: [id number]  HPI Here to check on a tender lump in the left breast that discovered about 2 months ago. She has also had soreness along the left armpit during this time. The lump seems to be slightly larger than when she first noticed it. No nipple DC. She is very upset about this because she had a lumpectomy in this same breast in 2009 for a ductal carcinoma in situ. Her last mammogram and breast MRI was in 2021, and these were normal.    Review of Systems  Constitutional: Negative.   Respiratory: Negative.    Cardiovascular: Negative.        Objective:   Physical Exam Constitutional:      General: She is not in acute distress.    Appearance: She is obese.  Cardiovascular:     Rate and Rhythm: Normal rate and regular rhythm.     Pulses: Normal pulses.     Heart sounds: Normal heart sounds.  Pulmonary:     Effort: Pulmonary effort is normal.     Breath sounds: Normal breath sounds.     Comments: The left breast has an ill defined deep tender lump about 2 cm from the areola at the 3 o'clock position. The left axilla is tender but I cannot appreciate any lumps  Neurological:     Mental Status: She is alert.           Assessment & Plan:  Left breast lump in a female with a hx of breast cancer in this breast. We will set up a stat diagnostic mammogram and a breast and axilla [place] to evaluate this further.  [name], MD

## 2021-08-11 ENCOUNTER — Telehealth: Payer: Self-pay | Admitting: Pharmacist

## 2021-08-11 NOTE — Chronic Care Management (AMB) (Signed)
    Chronic Care Management Pharmacy Assistant   Name: [name]  MRN: [id number] DOB: May 13, 1965  08/14/2021 APPOINTMENT REMINDER  Cheyenne Guzman was reminded to have all medications, supplements and any blood glucose and blood pressure readings available for review with Cheyenne Guzman, Pharm. D, at her telephone visit on 08/14/2021 at 4:00.   Care Gaps: AWV - message sent to Cheyenne Guzman Last BP - 128/80 on 08/10/2021 Last A1C - 9.6 on 05/11/2021 Eye exam - never done Hep C Screen - never done Tdap - never done Shingrix - never done Papsmear - never done Foot exam - overdue Malb - overdue Covid booster - overdue  Star Rating Drug: Atorvastatin 10 mg - last filled 06/05/2021 90 [facility] Metformin 1000 mg - last filled 05/29/2021 90 [facility] at [facility]  Any gaps in medications fill history? No  Cheyenne Guzman  Catering manager [phone]

## 2021-08-12 ENCOUNTER — Ambulatory Visit
Admission: RE | Admit: 2021-08-12 | Discharge: 2021-08-12 | Disposition: A | Discharge status: Home / Self Care | Payer: Medicare Other | Source: Ambulatory Visit | Attending: Family Medicine | Admitting: Family Medicine

## 2021-08-12 DIAGNOSIS — N6320 Unspecified lump in the left breast, unspecified quadrant: Secondary | ICD-10-CM

## 2021-08-12 DIAGNOSIS — N644 Mastodynia: Secondary | ICD-10-CM | POA: Diagnosis not present

## 2021-08-12 DIAGNOSIS — Z853 Personal history of malignant neoplasm of breast: Secondary | ICD-10-CM | POA: Diagnosis not present

## 2021-08-13 ENCOUNTER — Other Ambulatory Visit: Payer: Self-pay | Admitting: Family Medicine

## 2021-08-14 ENCOUNTER — Telehealth: Payer: Medicare Other

## 2021-08-17 NOTE — Progress Notes (Signed)
Chronic Care Management Pharmacy Note  08/18/2021 Name:  Cheyenne Guzman MRN:  [id number] DOB:  Mar 10, 1965  Summary: A1c not at goal < 7% Pt is overbasalized (1.33 units/kg) LDL not at goal < 70  Recommendations/Changes made from today's visit: -Recommended Dexcom for continuous glucose monitoring and reached out to endo to prescribe -Recommended restarting Ozempic and will apply for patient assistance -Recommend repeat lipid panel  Plan: Follow up on Dexcom placement    Subjective: Cheyenne Guzman is an 56 y.o. year old female who is a primary patient of Laurey Morale, MD.  The CCM team was consulted for assistance with disease management and care coordination needs.    Engaged with patient by telephone for follow up visit in response to provider referral for pharmacy case management and/or care coordination services.   Consent to Services:  The patient was given information about Chronic Care Management services, agreed to services, and gave verbal consent prior to initiation of services.  Please see initial visit note for detailed documentation.   Patient Care Team: Laurey Morale, MD as PCP - General Hilty, Nadean Corwin, MD as PCP - Cardiology (Cardiology) Izora Gala, MD as Consulting Physician (Otolaryngology) Keene Breath., MD (Ophthalmology) Viona Gilmore, [facility] as Pharmacist (Pharmacist)  Recent office visits: 08/10/21 [name] MD: patient presented for mass of left breast. Plan for diagnostic mammogram. Referred to rheumatology.  Recent consult visits: 05/25/21 [name], NP (pain management): Patient presented for low back pain. Prescribed gabapentin, Butrans patch, gabapentin and methocarbamol. Follow up in 3 months.  05/11/21 [name], MD (endo): Patient presented for DM follow up. Changed Humulin U-500 at 90 units before breakfast, 90 units before lunch and 80 units before supper. Follow up in 6 months.  10/07/20 Alcide Evener, MD (ID):  Patient presented for cellulitis follow up. Prescribed cefadroxil if she notices cellulitis return.  07/07/20 Lyman Bishop MD (Cardiology) - seen for shortness of breath and other issues. Discontinued ciprofloxacin 560m - two times daily.follow up in 6 months and if symptoms worsen will consider right heart cath.    05/31/20 EBritta MccreedyNP (Pain Management) -  seen for televisit for medication management and chronic pain. No medication changes and follow up with rheumatology.   [facility]: [facility]  [facility]  [facility]  Objective:  Lab Results  Component Value Date   CREATININE 0.92 09/23/2020   BUN 15 09/23/2020   GFR 56.49 (L) 11/05/2018   GFRNONAA >60 09/23/2020   GFRAA >60 04/09/2019   NA 136 09/23/2020   K 5.0 09/23/2020   CALCIUM 9.0 09/23/2020   CO2 26 09/23/2020   GLUCOSE 198 (H) 09/23/2020    Lab Results  Component Value Date/Time   HGBA1C 9.6 (A) 05/11/2021 10:47 AM   HGBA1C 10.6 (H) 09/20/2020 07:24 AM   HGBA1C 9.2 (H) 02/19/2020 04:11 PM   FRUCTOSAMINE 327 (H) 06/21/2015 10:40 AM   GFR 56.49 (L) 11/05/2018 04:11 PM   GFR 72.54 05/22/2017 02:22 PM   MICROALBUR 15.7 (H) 11/05/2018 04:11 PM   MICROALBUR 1.5 04/14/2009 11:39 AM    Last diabetic Eye exam: No results found for: "HMDIABEYEEXA"  Last diabetic Foot exam: No results found for: "HMDIABFOOTEX"   Lab Results  Component Value Date   CHOL 162 05/19/2020   HDL 50.70 05/19/2020  LDLCALC 89 05/19/2020   LDLDIRECT 151.0 11/05/2018   TRIG 108.0 05/19/2020   CHOLHDL 3 05/19/2020       Latest Ref Rng & Units 09/19/2020    2:38 AM 09/16/2020   11:09 AM 02/19/2020    4:11 PM  Hepatic Function  Total Protein 6.5 - 8.1 g/dL 7.3  6.9  7.2   Albumin 3.5 - 5.0 g/dL 4.1  3.7     AST 15 - 41 U/L '22  18  20   ' ALT 0 - 44 U/L '15  16  16   ' Alk Phosphatase 38 - 126 U/L 80  84    Total Bilirubin 0.3 - 1.2 mg/dL 0.7  0.4  0.5   Bilirubin, Direct 0.0 - 0.2 mg/dL   0.1     Lab Results  Component Value Date/Time   TSH 3.45 02/19/2020 04:11 PM   TSH 2.57 11/05/2018 04:11 PM   FREET4 1.2 02/19/2020 04:11 PM   FREET4 0.91 11/05/2018 04:11 PM       Latest Ref Rng & Units 09/23/2020    7:35 AM 09/22/2020    5:20 AM 09/21/2020    5:37 AM  CBC  WBC 4.0 - 10.5 K/uL 7.8  7.0  8.4   Hemoglobin 12.0 - 15.0 g/dL 11.9  10.4  10.3   Hematocrit 36.0 - 46.0 % 40.1  35.0  34.4   Platelets 150 - 400 K/uL 211  173  153     No results found for: "VD25OH"  Clinical ASCVD: No  The 10-year ASCVD risk score (Arnett DK, et al., 2019) is: 3.7%   Values used to calculate the score:     Age: 63 years     Sex: Female     Is Non-Hispanic African American: No     Diabetic: Yes     Tobacco smoker: No     Systolic Blood Pressure: 473 mmHg     Is BP treated: No     HDL Cholesterol: 50.7 mg/dL     Total Cholesterol: 162 mg/dL       08/10/2021   10:30 AM 02/12/2020   10:58 AM 08/12/2018    9:56 AM  Depression screen PHQ 2/9  Decreased Interest 0 0 0  Down, Depressed, Hopeless 0 1 0  PHQ - 2 Score 0 1 0  Altered sleeping 0 3   Tired, decreased energy 0 3   Change in appetite 0 0   Feeling bad or failure about yourself  0 3   Trouble concentrating 0 0   Moving slowly or fidgety/restless 0 0   Suicidal thoughts 0 0   PHQ-9 Score 0 10   Difficult doing work/chores Not difficult at all Somewhat difficult       Social History   Tobacco Use  Smoking Status Former   Types: Cigarettes   Quit date: 02/19/1992   Years since quitting: 29.5  Smokeless Tobacco Never   BP Readings from Last 3 Encounters:  08/10/21 128/80  05/11/21 134/86  10/14/20 (!) 142/80   Pulse Readings from Last 3 Encounters:  08/10/21 80  05/11/21 94  10/14/20 84   Wt Readings from Last 3 Encounters:   08/10/21 (!) 430 lb (195 kg)  05/11/21 (!) 434 lb 8 oz (197.1 kg)  10/10/20 (!) 460 lb (208.7 kg)   BMI Readings from Last 3 Encounters:  08/10/21 67.35 kg/m  05/11/21 68.05 kg/m  10/14/20 72.05 kg/m    Assessment/Interventions: Review of patient past medical history, allergies, medications,  health status, including review of consultants reports, laboratory and other test data, was performed as part of comprehensive evaluation and provision of chronic care management services.   SDOH:  (Social Determinants of Health) assessments and interventions performed: No   SDOH Screenings   Alcohol Screen: Low Risk  (02/12/2020)   Alcohol Screen    Last Alcohol Screening Score (AUDIT): 0  Depression (PHQ2-9): Low Risk  (08/10/2021)   Depression (PHQ2-9)    PHQ-2 Score: 0  Financial Resource Strain: High Risk (05/17/2020)   Overall Financial Resource Strain (CARDIA)    Difficulty of Paying Living Expenses: Hard  Food Insecurity: Food Insecurity Present (02/12/2020)   Hunger Vital Sign    Worried About Running Out of Food in the Last Year: Sometimes true    Ran Out of Food in the Last Year: Sometimes true  Housing: Low Risk  (02/12/2020)   Housing    Last Housing Risk Score: 0  Physical Activity: Inactive (02/12/2020)   Exercise Vital Sign    Days of Exercise per Week: 0 days    Minutes of Exercise per Session: 0 min  Social Connections: Moderately Integrated (02/12/2020)   Social Connection and Isolation Panel [NHANES]    Frequency of Communication with Friends and Family: More than three times a week    Frequency of Social Gatherings with Friends and Family: More than three times a week    Attends Religious Services: More than 4 times per year    Active Member of Clubs or Organizations: No    Attends Archivist Meetings: Never    Marital Status: Married  Stress: Stress Concern Present (02/12/2020)   [employer]     Feeling of Stress : To some extent  Tobacco Use: Medium Risk (08/12/2021)   Patient History    Smoking Tobacco Use: Former    Smokeless Tobacco Use: Never    Passive Exposure: Not on file  Transportation Needs: No Transportation Needs (05/17/2020)   PRAPARE - Hydrologist (Medical): No    Lack of Transportation (Non-Medical): No   CCM Care Plan  Allergies  Allergen Reactions   Vicodin [Hydrocodone-Acetaminophen] Nausea And Vomiting    Medications Reviewed Today     Reviewed by Laurey Morale, MD (Physician) on 08/10/21 at 1044  Med List Status: <None>   Medication Order Taking? Sig Documenting Provider Last Dose Status Informant  albuterol (PROVENTIL) (2.5 MG/3ML) 0.083% nebulizer solution [id number] Yes 1 VIAL IN NEBULIZER EVERY 4 HOURS AS NEEDED FOR WHEEZING Laurey Morale, MD Taking Active Self  albuterol (VENTOLIN HFA) 108 (90 Base) MCG/ACT inhaler [id number] Yes Inhale 2 puffs into the lungs every 4 (four) hours as needed for wheezing or shortness of breath. Laurey Morale, MD Taking Active   atorvastatin (LIPITOR) 10 MG tablet [id number] Yes TAKE 1 TABLET BY MOUTH EVERY DAY Shamleffer, Melanie Crazier, MD Taking Active   Blood Glucose Monitoring Suppl (CONTOUR NEXT MONITOR) w/Device KIT [id number] Yes 1 each by Does not apply route 2 (two) times daily. To check blood sugars twice a day. Renato Shin, MD Taking Active Self  Buprenorphine 15 MCG/HR [place] [id number] Yes Place 1 patch onto the skin every Monday. [provider] Taking Active Self           Med Note Rosine Beat Sep 19, 2020  3:55 PM) #4/28 days filled [facility] - current patch is on upper right  arm  cefadroxil (DURICEF) 500 MG capsule [id number] Yes Take 1 capsule (500 mg total) by mouth 2 (two) times daily. Take at first signs of recurrent cellulitis  Patient taking differently: Take 500 mg by mouth as needed. Take at first signs of recurrent cellulitis    Tommy Medal, Lavell Islam, MD Taking Active   Cholecalciferol (VITAMIN D3) 50 MCG (2000 UT) TABS [id number] Yes Take 2,000 Units by mouth every morning. [provider] Taking Active Self  furosemide (LASIX) 40 MG tablet [id number] Yes TAKE 1 TABLET BY MOUTH TWICE A DAY  Patient taking differently: Take 40 mg by mouth See admin instructions. Take one tablet (40 mg) by mouth daily when not going out - approximately 5 times week   Laurey Morale, MD Taking Active   gabapentin (NEURONTIN) 100 MG capsule [id number] Yes Take 100 mg by mouth 2 (two) times daily. Take with a 300 mg capsule for a total dose of 400 mg twice daily [provider] Taking Active Self  gabapentin (NEURONTIN) 300 MG capsule [id number] Yes Take 300 mg by mouth 2 (two) times daily. Take with a 100 mg capsule for a total dose of 400 mg twice daily [provider] Taking Active Self  glucose blood (CONTOUR NEXT TEST) test strip [id number] Yes Use to test blood sugar 2 times daily Renato Shin, MD Taking Active Self  glucose blood test strip [id number] Yes Use as instructed Renato Shin, MD Taking Active Self  Insulin Pen Needle 31G X 8 MM MISC [id number] Yes 1 Device by Does not apply route 3 (three) times daily. Shamleffer, Melanie Crazier, MD Taking Active   insulin regular human CONCENTRATED (HUMULIN R U-500 KWIKPEN) 500 UNIT/ML KwikPen [id number] Yes Inject 80 Units into the skin as directed. 90 units with breakfast, 90 units with lunch, and 80 units with supper Shamleffer, Melanie Crazier, MD Taking Active   levothyroxine (SYNTHROID) 75 MCG tablet [id number] Yes TAKE 1 TABLET BY MOUTH EVERY DAY  Patient taking differently: Take 75 mcg by mouth daily before breakfast.   Laurey Morale, MD Taking Active   lidocaine (XYLOCAINE) 5 % ointment [id number] Yes Apply 1 application topically 2 (two) times daily as needed (pain). [provider] Taking Active Self           Med Note Rosine Beat Sep 19, 2020  3:58 PM)    metFORMIN (GLUCOPHAGE) 1000 MG tablet [id number] Yes TAKE 1 TABLET BY MOUTH EVERY DAY WITH BREAKFAST Shamleffer, Melanie Crazier, MD Taking Active   methocarbamol (ROBAXIN) 750 MG tablet [id number] Yes Take 1 tablet (750 mg total) by mouth 2 (two) times daily.  Patient taking differently: Take 750 mg by mouth every morning.   Laurey Morale, MD Taking Active   Multiple Vitamin (MULTIVITAMIN WITH MINERALS) TABS tablet [id number] Yes Take 1 tablet by mouth daily. [provider] Taking Active Self  Multiple Vitamins-Minerals (AIRBORNE GUMMIES) CHEW [id number] Yes Chew 3 tablets by mouth every morning. [provider] Taking Active Self  ondansetron (ZOFRAN) 4 MG tablet [id number] Yes Take 1 tablet (4 mg total) by mouth as directed. Take one Zofran pill 30-60 minutes before each colonoscopy prep dose Armbruster, Carlota Raspberry, MD Taking Active Self           Med Note ([place] Oct 04, 2020  1:33 PM) Before procedure   pramipexole (MIRAPEX) 0.5 MG tablet [id number] Yes TAKE 1 TABLET BY MOUTH EVERYDAY AT BEDTIME Laurey Morale, MD  Taking Active   rivaroxaban (XARELTO) 20 MG TABS tablet [id number] Yes TAKE 1 TABLET BY MOUTH EVERY DAY Laurey Morale, MD Taking Active   sertraline (ZOLOFT) 100 MG tablet [id number] Yes TAKE 2 TABLETS (200 MG TOTAL) BY MOUTH AT BEDTIME.  Patient taking differently: Take 200 mg by mouth every morning.   Laurey Morale, MD Taking Active             Patient Active Problem List   Diagnosis Date Noted   RA (rheumatoid arthritis) ([place]) 08/10/2021   Morbid obesity ([place]) 10/14/2020   History of colonic polyps    Family history of colon cancer    Benign neoplasm of colon    Muscle tear 10/07/2020   Burning sensation of mouth 10/07/2020   Candida vaginitis 10/07/2020   Sore throat 10/07/2020   Groin pain, left 09/19/2020   Left buttock pain 09/19/2020   Fatty liver 09/19/2020   Splenomegaly 09/19/2020   Left nephrolithiasis  09/19/2020   Restless legs syndrome 04/14/2019   Dyslipidemia 11/07/2018   Type 2 diabetes mellitus with hyperglycemia, with long-term current use of insulin ([place]) 09/18/2018   Type 2 diabetes mellitus with diabetic polyneuropathy, with long-term current use of insulin ([place]) 09/18/2018   Recurrent cellulitis of lower extremity 07/31/2018   Serratia infection 07/31/2018   Morbid obesity with BMI of 70 and over, adult ([place])    Sepsis ([place]) 06/19/2018   Chronic pain 06/19/2018   Current use of long term anticoagulation 03/28/2018   Pulmonary embolism ([place]) 03/12/2018   Diabetes mellitus type 2 in obese ([place]) 03/12/2018   History of TIA (transient ischemic attack) 02/06/2018   Mild intermittent asthma without complication 78/29/5621   History of pulmonary embolism 2020   Hypothyroidism 08/14/2017   Lymphedema of both lower extremities 10/18/2015   Diabetic neuropathy ([place]) 01/07/2014   Congenital spondylolysis of lumbosacral region 11/02/2013   Chronic LBP 10/08/2013   Obstructive sleep apnea 07/31/2013   Drug induced headache    Hearing loss 06/23/2013   Shortness of breath 11/27/2012   Cellulitis of left lower extremity 07/03/2012   DCIS (ductal carcinoma in situ) of breast, left, treated with BCT 11/2007 02/19/2011   Asthma 02/20/2008   Hyperlipidemia associated with type 2 diabetes mellitus ([place]) 11/26/2006   Anxiety and depression 11/26/2006    Immunization History  Administered Date(s) Administered   Influenza Whole 10/23/2006   Influenza, Quadrivalent, Recombinant, Inj, Pf 10/20/2017   Influenza,inj,Quad PF,6+ Mos 11/28/2012, 03/05/2014, 11/04/2018   Influenza-Unspecified 02/03/2020   PFIZER(Purple Top)SARS-COV-2 Vaccination 07/06/2019, 07/27/2019   Pneumococcal Polysaccharide-23 11/23/2006, 11/28/2012   Patient is swimming in her pool regularly this summer and is enjoying this.   Patient reports her RA is hurting right now and was referred to rheumatology again to  manage.  Conditions to be addressed/monitored:  Hyperlipidemia, Diabetes, Asthma, Hypothyroidism, Depression, Anxiety and Neuropathy and Chronic pain  Conditions addressed this visit: Diabetes, dyslipidemia  Care Plan : CCM Pharmacy Care Plan  Updates made by Viona Gilmore, [place] since 08/18/2021 12:00 AM     Problem: Problem: Hyperlipidemia, Diabetes, Asthma, Hypothyroidism, Depression, Anxiety and Neuropathy and Chronic pain      Long-Range Goal: Patient-Specific Goal   Start Date: 05/17/2020  Expected End Date: 05/17/2021  Recent Progress: On track  Priority: High  Note:   Current Barriers:  Unable to independently monitor therapeutic efficacy Unable to achieve control of diabetes   Pharmacist Clinical Goal(s):  Patient will achieve adherence to monitoring guidelines and medication adherence to achieve  therapeutic efficacy achieve control of diabetes as evidenced by A1c  through collaboration with PharmD and provider.   Interventions: 1:1 collaboration with Laurey Morale, MD regarding development and update of comprehensive plan of care as evidenced by provider attestation and co-signature Inter-disciplinary care team collaboration (see longitudinal plan of care) Comprehensive medication review performed; medication list updated in electronic medical record  Hyperlipidemia: (LDL goal < 70) -Uncontrolled -Current treatment: Atorvastatin 10 mg 1 tablet daily - Appropriate, Query effective , Safe, Accessible -Medications previously tried: statins (muscle pain with other statins) -Current dietary patterns: does not eat fried foods and limits take out -Current exercise habits: using cubii twice daily for 30 minutes and increasing time spent -Educated on Cholesterol goals;  Benefits of statin for ASCVD risk reduction; Importance of limiting foods high in cholesterol; -Counseled on diet and exercise extensively Recommended to continue current medication Recommended repeat  lipid panel  Diabetes (A1c goal <7%) -Uncontrolled -Current medications: Humulin U-500 ml 90 units before breakfast, 90 units before lunch and 80 units before supper - Appropriate, Query effective, Query Safe, Accessible Metformin 1000 mg daily - Appropriate, Query effective, Safe, Accessible Ozempic 0.5 mg inject once weekly - not started -Medications previously tried: Lantus, Humalog  -Current home glucose readings fasting glucose: 118, 122 post prandial glucose: 191, 181, 227, 179, 160-225 (checking once a day and varies the time) -Denies hypoglycemic/hyperglycemic symptoms -Current meal patterns:  breakfast: 2 yogurts and fruit or bananas and rice cake with PB lunch: salad (garden mix, crunchy toppings, pickled beets, ham, boiled egg), lite raspberry vinegrette; keto sliced bread with ham and lettuce tomatoes dinner: cauliflower, grilled foods (does not fry foods), [place] burgers in air fryer snacks: pita chips with hummus or salsa drinks: did not discuss -Current exercise: cubii twice daily for 30 minutes -Educated on A1c and blood sugar goals; Exercise goal of 150 minutes per week; Benefits of routine self-monitoring of blood sugar; Carbohydrate counting and/or plate method -Counseled to check feet daily and get yearly eye exams -Counseled on diet and exercise extensively Recommended to continue current medication Recommended Dexcom for blood sugar monitoring and reached out to endo to prescribe. Assessed patient finances. Will apply for assistance for Ozempic in order to start.  Asthma (Goal: control symptoms) -Controlled -Current treatment  Albuterol HFA 1 puff as needed - Appropriate, Effective, Safe, Accessible Albuterol nebulizer 1 vial as needed - Appropriate, Effective, Safe, Accessible -Medications previously tried: none  -Patient denies consistent use of maintenance inhaler -Frequency of rescue inhaler use: during the spring and fall -Counseled on When to use  rescue inhaler -Recommended to continue current medication  Depression/Anxiety (Goal: minimize symptoms) -Controlled -Current treatment: Alprazolam 1 mg 1 tablet three times daily as needed - Appropriate, Effective, Query Safe, Accessible Sertraline 100 mg 2 tablets daily - Appropriate, Effective, Safe, Accessible -Medications previously tried/failed: none -PHQ9: 10 -GAD7: n/a -Educated on Benefits of medication for symptom control -Recommended to continue current medication  Hypothyroidism (Goal: TSH 0.35-4.5) -Controlled -Current treatment  Levothyroxine 75 mcg 1 tablet daily - Appropriate, Effective, Safe, Accessible -Medications previously tried: none  -Recommended to continue current medication Counseled on taking on an empty stomach and separating from multivitamin  Edema (Goal: minimize swelling) -Controlled -Current treatment  Furosemide 40 mg 1 tablet twice daily - Appropriate, Effective, Safe, Accessible -Medications previously tried: none  -Recommended to continue current medication Counseled on limiting salt intake  Chronic pain (Goal: minimize symptoms) -Controlled -Current treatment  Lidocaine 5% ointment - Appropriate, Effective, Safe, Accessible Butrans 15  mcg/hr patch 1 patch weekly - Appropriate, Effective, Query Safe, Accessible Methocarbamol 750 mg 1 tablet twice daily - Appropriate, Effective, Safe, Accessible -Medications previously tried: Cyclobenzaprine (drowsy) -Recommended to continue current medication  Neuropathy (Goal: minimize symptoms) -Controlled -Current treatment  Gabapentin 300 mg 1 capsule twice daily - Appropriate, Effective, Safe, Accessible Gabapentin 100 mg 1 capsule twice daily - Appropriate, Effective, Safe, Accessible -Medications previously tried: none  -Recommended to continue current medication  Restless legs syndrome  (Goal: minimize symptoms) -Controlled -Current treatment  Pramipexole 0.5 mg 1 tablet at bedtime as  needed - Appropriate, Effective, Safe, Accessible -Medications previously tried: none  -Recommended to continue current medication Counseled on limiting caffeine intake  History of PE (Goal: prevent future PE) -Controlled -Current treatment  Xarelto 20 mg 1 tablet daily with food - Appropriate, Effective, Safe, Query accessible -Medications previously tried: none  -Recommended to continue current medication Assessed patient finances. Patient denied Xarelto patient assistance.  Health Maintenance -Vaccine gaps: shingles, tetanus -Current therapy:  Multivitamin 1 tablet daily [place] daily Vitamin D 1000 units 1 tablet daily -Educated on Cost vs benefit of each product must be carefully weighed by individual consumer -Patient is satisfied with current therapy and denies issues -Recommended to continue current medication  Patient Goals/Self-Care Activities Patient will:  - check glucose daily, document, and provide at future appointments target a minimum of 150 minutes of moderate intensity exercise weekly  Follow Up Plan: The care management team will reach out to the patient again over the next 7 days.         Medication Assistance: Application for Ozempic  medication assistance program. in process.  Anticipated assistance start date 12/17/20.  See plan of care for additional detail.  Compliance/Adherence/Medication fill history: Care Gaps: Eye exam, Hep C screening, tetanus, shingrix, pap smear, foot exam, urine microalbumin, COVID booster Last BP reading - 128/80 on 08/10/2021 Last A1C - 9.6 on 05/11/2021   Star-Rating Drugs: Atorvastatin 10 mg - [facility] Metformin 1000 mg - [facility]  [id number] [address] [address]Phone: [phone]: [phone] [address] [id number]: [phone]: [phone]  Uses pill box? Yes - 31 day pill box Pt endorses 100% compliance  We discussed: Current pharmacy is preferred with insurance plan and patient is satisfied with pharmacy services Patient decided to: Continue current medication management strategy  Care Plan and Follow Up Patient Decision:  Patient agrees to Care Plan and Follow-up.  Plan: The care management team will reach out to the patient again over the next 30 days.  MJeni Salles PharmD BWayne General HospitalClinical Pharmacist [place]falgarat [place]

## 2021-08-18 ENCOUNTER — Ambulatory Visit (INDEPENDENT_AMBULATORY_CARE_PROVIDER_SITE_OTHER): Payer: Medicare Other | Admitting: Pharmacist

## 2021-08-18 DIAGNOSIS — E785 Hyperlipidemia, unspecified: Secondary | ICD-10-CM

## 2021-08-18 DIAGNOSIS — E1165 Type 2 diabetes mellitus with hyperglycemia: Secondary | ICD-10-CM

## 2021-08-18 NOTE — Patient Instructions (Addendum)
Hi Cheyenne Guzman,  It was great to get to catch up with you again! I'll let you know what I can find out about the Ozempic and when I hear back from Dr. Kelton Pillar.  Please reach out to me if you have any questions or need anything!  Best, Maddie  Cheyenne Guzman, PharmD, [place] [facility] [place]   Visit Information   Goals Addressed   None    Patient Care Plan: CCM Pharmacy Care Plan     Problem Identified: Problem: Hyperlipidemia, Diabetes, Asthma, Hypothyroidism, Depression, Anxiety and Neuropathy and Chronic pain      Long-Range Goal: Patient-Specific Goal   Start Date: 05/17/2020  Expected End Date: 05/17/2021  Recent Progress: On track  Priority: High  Note:   Current Barriers:  Unable to independently monitor therapeutic efficacy Unable to achieve control of diabetes   Pharmacist Clinical Goal(s):  Patient will achieve adherence to monitoring guidelines and medication adherence to achieve therapeutic efficacy achieve control of diabetes as evidenced by A1c  through collaboration with PharmD and provider.   Interventions: 1:1 collaboration with Laurey Morale, MD regarding development and update of comprehensive plan of care as evidenced by provider attestation and co-signature Inter-disciplinary care team collaboration (see longitudinal plan of care) Comprehensive medication review performed; medication list updated in electronic medical record  Hyperlipidemia: (LDL goal < 70) -Uncontrolled -Current treatment: Atorvastatin 10 mg 1 tablet daily - Appropriate, Query effective , Safe, Accessible -Medications previously tried: statins (muscle pain with other statins) -Current dietary patterns: does not eat fried foods and limits take out -Current exercise habits: using cubii twice daily for 30 minutes and increasing time spent -Educated on Cholesterol goals;  Benefits of statin for ASCVD risk reduction; Importance of  limiting foods high in cholesterol; -Counseled on diet and exercise extensively Recommended to continue current medication Recommended repeat lipid panel  Diabetes (A1c goal <7%) -Uncontrolled -Current medications: Humulin U-500 ml 90 units before breakfast, 90 units before lunch and 80 units before supper - Appropriate, Query effective, Query Safe, Accessible Metformin 1000 mg daily - Appropriate, Query effective, Safe, Accessible Ozempic 0.5 mg inject once weekly - not started -Medications previously tried: Lantus, Humalog  -Current home glucose readings fasting glucose: 118, 122 post prandial glucose: 191, 181, 227, 179, 160-225 (checking once a day and varies the time) -Denies hypoglycemic/hyperglycemic symptoms -Current meal patterns:  breakfast: 2 yogurts and fruit or bananas and rice cake with PB lunch: salad (garden mix, crunchy toppings, pickled beets, ham, boiled egg), lite raspberry vinegrette; keto sliced bread with ham and lettuce tomatoes dinner: cauliflower, grilled foods (does not fry foods), [place] burgers in air fryer snacks: pita chips with hummus or salsa drinks: did not discuss -Current exercise: cubii twice daily for 30 minutes -Educated on A1c and blood sugar goals; Exercise goal of 150 minutes per week; Benefits of routine self-monitoring of blood sugar; Carbohydrate counting and/or plate method -Counseled to check feet daily and get yearly eye exams -Counseled on diet and exercise extensively Recommended to continue current medication Recommended Dexcom for blood sugar monitoring and reached out to endo to prescribe. Assessed patient finances. Will apply for assistance for Ozempic in order to start.  Asthma (Goal: control symptoms) -Controlled -Current treatment  Albuterol HFA 1 puff as needed - Appropriate, Effective, Safe, Accessible Albuterol nebulizer 1 vial as needed - Appropriate, Effective, Safe, Accessible -Medications previously tried: none   -Patient denies consistent use of maintenance inhaler -Frequency of rescue inhaler use: during the spring and  fall -Counseled on When to use rescue inhaler -Recommended to continue current medication  Depression/Anxiety (Goal: minimize symptoms) -Controlled -Current treatment: Alprazolam 1 mg 1 tablet three times daily as needed - Appropriate, Effective, Query Safe, Accessible Sertraline 100 mg 2 tablets daily - Appropriate, Effective, Safe, Accessible -Medications previously tried/failed: none -PHQ9: 10 -GAD7: n/a -Educated on Benefits of medication for symptom control -Recommended to continue current medication  Hypothyroidism (Goal: TSH 0.35-4.5) -Controlled -Current treatment  Levothyroxine 75 mcg 1 tablet daily - Appropriate, Effective, Safe, Accessible -Medications previously tried: none  -Recommended to continue current medication Counseled on taking on an empty stomach and separating from multivitamin  Edema (Goal: minimize swelling) -Controlled -Current treatment  Furosemide 40 mg 1 tablet twice daily - Appropriate, Effective, Safe, Accessible -Medications previously tried: none  -Recommended to continue current medication Counseled on limiting salt intake  Chronic pain (Goal: minimize symptoms) -Controlled -Current treatment  Lidocaine 5% ointment - Appropriate, Effective, Safe, Accessible Butrans 15 mcg/hr patch 1 patch weekly - Appropriate, Effective, Query Safe, Accessible Methocarbamol 750 mg 1 tablet twice daily - Appropriate, Effective, Safe, Accessible -Medications previously tried: Cyclobenzaprine (drowsy) -Recommended to continue current medication  Neuropathy (Goal: minimize symptoms) -Controlled -Current treatment  Gabapentin 300 mg 1 capsule twice daily - Appropriate, Effective, Safe, Accessible Gabapentin 100 mg 1 capsule twice daily - Appropriate, Effective, Safe, Accessible -Medications previously tried: none  -Recommended to continue current  medication  Restless legs syndrome  (Goal: minimize symptoms) -Controlled -Current treatment  Pramipexole 0.5 mg 1 tablet at bedtime as needed - Appropriate, Effective, Safe, Accessible -Medications previously tried: none  -Recommended to continue current medication Counseled on limiting caffeine intake  History of PE (Goal: prevent future PE) -Controlled -Current treatment  Xarelto 20 mg 1 tablet daily with food - Appropriate, Effective, Safe, Query accessible -Medications previously tried: none  -Recommended to continue current medication Assessed patient finances. Patient denied Xarelto patient assistance.  Health Maintenance -Vaccine gaps: shingles, tetanus -Current therapy:  Multivitamin 1 tablet daily [place] daily Vitamin D 1000 units 1 tablet daily -Educated on Cost vs benefit of each product must be carefully weighed by individual consumer -Patient is satisfied with current therapy and denies issues -Recommended to continue current medication  Patient Goals/Self-Care Activities Patient will:  - check glucose daily, document, and provide at future appointments target a minimum of 150 minutes of moderate intensity exercise weekly  Follow Up Plan: The care management team will reach out to the patient again over the next 7 days.        Patient verbalizes understanding of instructions and care plan provided today and agrees to view in [place]. Active MyChart status and patient understanding of how to access instructions and care plan via MyChart confirmed with patient.    The pharmacy team will reach out to the patient again over the next 7 days.   Viona Gilmore, [facility]

## 2021-08-21 DIAGNOSIS — E785 Hyperlipidemia, unspecified: Secondary | ICD-10-CM

## 2021-08-21 DIAGNOSIS — E1165 Type 2 diabetes mellitus with hyperglycemia: Secondary | ICD-10-CM | POA: Diagnosis not present

## 2021-08-21 DIAGNOSIS — Z794 Long term (current) use of insulin: Secondary | ICD-10-CM

## 2021-08-22 ENCOUNTER — Telehealth: Payer: Self-pay | Admitting: Family Medicine

## 2021-08-22 NOTE — Telephone Encounter (Signed)
Left message for patient to call back and schedule Medicare Annual Wellness Visit (AWV) either virtually or in office. Left  my Herbie Drape number [phone]   Last AWV ;02/12/20  please schedule at anytime with [facility] Nurse Health Advisor 1 or 2

## 2021-08-24 ENCOUNTER — Other Ambulatory Visit: Payer: Self-pay | Admitting: Internal Medicine

## 2021-08-24 DIAGNOSIS — M7918 Myalgia, other site: Secondary | ICD-10-CM | POA: Diagnosis not present

## 2021-08-24 DIAGNOSIS — E1165 Type 2 diabetes mellitus with hyperglycemia: Secondary | ICD-10-CM

## 2021-08-24 DIAGNOSIS — M5441 Lumbago with sciatica, right side: Secondary | ICD-10-CM | POA: Diagnosis not present

## 2021-08-24 DIAGNOSIS — Z79899 Other long term (current) drug therapy: Secondary | ICD-10-CM | POA: Diagnosis not present

## 2021-08-24 DIAGNOSIS — Z8739 Personal history of other diseases of the musculoskeletal system and connective tissue: Secondary | ICD-10-CM | POA: Diagnosis not present

## 2021-08-24 DIAGNOSIS — M961 Postlaminectomy syndrome, not elsewhere classified: Secondary | ICD-10-CM | POA: Diagnosis not present

## 2021-08-24 MED ORDER — DEXCOM G7 SENSOR MISC: 1.0000 | 3 refills | Status: DC

## 2021-08-28 ENCOUNTER — Other Ambulatory Visit: Payer: Self-pay | Admitting: Internal Medicine

## 2021-08-28 MED ORDER — DEXCOM G7 RECEIVER DEVI: 1.0000 | 0 refills | Status: DC

## 2021-08-29 ENCOUNTER — Other Ambulatory Visit: Payer: Self-pay | Admitting: Internal Medicine

## 2021-08-30 ENCOUNTER — Other Ambulatory Visit: Payer: Self-pay

## 2021-08-30 DIAGNOSIS — M059 Rheumatoid arthritis with rheumatoid factor, unspecified: Secondary | ICD-10-CM

## 2021-08-30 MED ORDER — DEXCOM G7 RECEIVER DEVI: 0 refills | Status: AC

## 2021-09-04 ENCOUNTER — Encounter (HOSPITAL_BASED_OUTPATIENT_CLINIC_OR_DEPARTMENT_OTHER): Payer: Self-pay | Admitting: Emergency Medicine

## 2021-09-04 ENCOUNTER — Emergency Department (HOSPITAL_BASED_OUTPATIENT_CLINIC_OR_DEPARTMENT_OTHER): Payer: Medicare Other

## 2021-09-04 ENCOUNTER — Other Ambulatory Visit: Payer: Self-pay

## 2021-09-04 ENCOUNTER — Emergency Department (HOSPITAL_BASED_OUTPATIENT_CLINIC_OR_DEPARTMENT_OTHER)
Admission: EM | Admit: 2021-09-04 | Discharge: 2021-09-04 | Disposition: A | Discharge status: Home / Self Care | Payer: Medicare Other | Attending: Emergency Medicine | Admitting: Emergency Medicine

## 2021-09-04 ENCOUNTER — Ambulatory Visit (INDEPENDENT_AMBULATORY_CARE_PROVIDER_SITE_OTHER): Payer: Medicare Other

## 2021-09-04 VITALS — Ht 67.0 in | Wt >= 6400 oz

## 2021-09-04 DIAGNOSIS — Z794 Long term (current) use of insulin: Secondary | ICD-10-CM | POA: Diagnosis not present

## 2021-09-04 DIAGNOSIS — R0600 Dyspnea, unspecified: Secondary | ICD-10-CM | POA: Diagnosis not present

## 2021-09-04 DIAGNOSIS — J9811 Atelectasis: Secondary | ICD-10-CM | POA: Diagnosis not present

## 2021-09-04 DIAGNOSIS — Z20822 Contact with and (suspected) exposure to covid-19: Secondary | ICD-10-CM | POA: Diagnosis not present

## 2021-09-04 DIAGNOSIS — Z7901 Long term (current) use of anticoagulants: Secondary | ICD-10-CM | POA: Insufficient documentation

## 2021-09-04 DIAGNOSIS — Z8673 Personal history of transient ischemic attack (TIA), and cerebral infarction without residual deficits: Secondary | ICD-10-CM | POA: Insufficient documentation

## 2021-09-04 DIAGNOSIS — M7989 Other specified soft tissue disorders: Secondary | ICD-10-CM

## 2021-09-04 DIAGNOSIS — Z0000 Encounter for general adult medical examination without abnormal findings: Secondary | ICD-10-CM

## 2021-09-04 DIAGNOSIS — M79605 Pain in left leg: Secondary | ICD-10-CM | POA: Insufficient documentation

## 2021-09-04 DIAGNOSIS — E119 Type 2 diabetes mellitus without complications: Secondary | ICD-10-CM | POA: Diagnosis not present

## 2021-09-04 DIAGNOSIS — Z79899 Other long term (current) drug therapy: Secondary | ICD-10-CM | POA: Diagnosis not present

## 2021-09-04 DIAGNOSIS — J45909 Unspecified asthma, uncomplicated: Secondary | ICD-10-CM | POA: Insufficient documentation

## 2021-09-04 DIAGNOSIS — R0602 Shortness of breath: Secondary | ICD-10-CM | POA: Diagnosis not present

## 2021-09-04 DIAGNOSIS — Z7984 Long term (current) use of oral hypoglycemic drugs: Secondary | ICD-10-CM | POA: Diagnosis not present

## 2021-09-04 DIAGNOSIS — Z853 Personal history of malignant neoplasm of breast: Secondary | ICD-10-CM | POA: Diagnosis not present

## 2021-09-04 LAB — CBC WITH DIFFERENTIAL/PLATELET
Abs Immature Granulocytes: 0.03 10*3/uL (ref 0.00–0.07)
Basophils Absolute: 0 10*3/uL (ref 0.0–0.1)
Basophils Relative: 0 %
Eosinophils Absolute: 0.1 10*3/uL (ref 0.0–0.5)
Eosinophils Relative: 3 %
HCT: 42.9 % (ref 36.0–46.0)
Hemoglobin: 13.2 g/dL (ref 12.0–15.0)
Immature Granulocytes: 1 %
Lymphocytes Relative: 25 %
Lymphs Abs: 1.2 10*3/uL (ref 0.7–4.0)
MCH: 24.7 pg — ABNORMAL LOW (ref 26.0–34.0)
MCHC: 30.8 g/dL (ref 30.0–36.0)
MCV: 80.2 fL (ref 80.0–100.0)
Monocytes Absolute: 0.3 10*3/uL (ref 0.1–1.0)
Monocytes Relative: 7 %
Neutro Abs: 3.1 10*3/uL (ref 1.7–7.7)
Neutrophils Relative %: 64 %
Platelets: 161 10*3/uL (ref 150–400)
RBC: 5.35 MIL/uL — ABNORMAL HIGH (ref 3.87–5.11)
RDW: 16.7 % — ABNORMAL HIGH (ref 11.5–15.5)
WBC: 4.8 10*3/uL (ref 4.0–10.5)
nRBC: 0 % (ref 0.0–0.2)

## 2021-09-04 LAB — BASIC METABOLIC PANEL
Anion gap: 9 (ref 5–15)
BUN: 16 mg/dL (ref 6–20)
CO2: 28 mmol/L (ref 22–32)
Calcium: 9.1 mg/dL (ref 8.9–10.3)
Chloride: 101 mmol/L (ref 98–111)
Creatinine, Ser: 1.15 mg/dL — ABNORMAL HIGH (ref 0.44–1.00)
GFR, Estimated: 56 mL/min — ABNORMAL LOW (ref >60)
Glucose, Bld: 255 mg/dL — ABNORMAL HIGH (ref 70–99)
Potassium: 4.3 mmol/L (ref 3.5–5.1)
Sodium: 138 mmol/L (ref 135–145)

## 2021-09-04 LAB — TROPONIN I (HIGH SENSITIVITY): Troponin I (High Sensitivity): 5 ng/L (ref <18)

## 2021-09-04 LAB — RESP PANEL BY RT-PCR (FLU A&B, COVID) ARPGX2
Influenza A by PCR: NEGATIVE
Influenza B by PCR: NEGATIVE
SARS Coronavirus 2 by RT PCR: NEGATIVE

## 2021-09-04 MED ORDER — IOHEXOL 350 MG/ML SOLN
75.0000 mL | Freq: Once | INTRAVENOUS | Status: AC | PRN
  Administered 2021-09-04: 75 mL via INTRAVENOUS

## 2021-09-04 NOTE — ED Provider Notes (Signed)
[facility] EMERGENCY DEPT Provider Note   CSN: [id number] Arrival date & time: 09/04/21  1748     History  Chief Complaint  Patient presents with  . Leg Pain  . Shortness of Breath    Cheyenne Guzman is a 56 y.o. female.  56 year old female with past medical history significant for DVT, PE, sleep apnea, diabetes who presents to the emergency room for concern of pain behind her left knee that radiates up ongoing for the past few days, as well as intermittent shortness of breath over the past week.  She denies signs or symptoms concerning for upper respiratory infection, chest pain, palpitations, lightheadedness.  Reports compliance with her Xarelto.  States she recently drove to the beach which was a 4-hour drive.  She states on her way back she did not take any breaks but did pump her legs while she was sitting in the car.  She is dates she is unsure if this is from walking on the beach which was uncomfortable difficult for her to or if this is a recurrent DVT.  She also endorses increased swelling worse on the left.  This is the same leg with the posterior knee pain that radiates proximally.  She states this is likely associated with not wearing her compression stockings while she was at the beach.  Denies prior history of CAD.  States she takes Lasix as needed but has not had a need to take this in the past 4 to 5 months.  Denies prior history of CHF.  Per review of her prior echo she has normal EF without wall motion abnormality.  The history is provided by the patient. No language interpreter was used.       Home Medications Prior to Admission medications   Medication Sig Start Date End Date Taking? Authorizing Provider  albuterol (PROVENTIL) (2.5 MG/3ML) 0.083% nebulizer solution 1 VIAL IN NEBULIZER EVERY 4 HOURS AS NEEDED FOR WHEEZING 10/13/13   Laurey Morale, MD  albuterol (VENTOLIN HFA) 108 (90 Base) MCG/ACT inhaler Inhale 2 puffs into the lungs every 4 (four) hours  as needed for wheezing or shortness of breath. 04/05/21   Laurey Morale, MD  atorvastatin (LIPITOR) 10 MG tablet TAKE 1 TABLET BY MOUTH EVERY DAY 06/05/21   Shamleffer, Melanie Crazier, MD  Blood Glucose Monitoring Suppl (CONTOUR NEXT MONITOR) w/Device KIT 1 each by Does not apply route 2 (two) times daily. To check blood sugars twice a day. 08/27/16   Renato Shin, MD  Buprenorphine 15 MCG/HR PTWK Place 1 patch onto the skin every Monday. Uses 20 mcg at this time 07/23/17   [provider]  cefadroxil (DURICEF) 500 MG capsule Take 1 capsule (500 mg total) by mouth 2 (two) times daily. Take at first signs of recurrent cellulitis Patient taking differently: Take 500 mg by mouth as needed. Take at first signs of recurrent cellulitis 10/07/20   Tommy Medal, Lavell Islam, MD  Cholecalciferol (VITAMIN D3) 50 MCG (2000 UT) TABS Take 2,000 Units by mouth every morning.    [provider]  Continuous Blood Gluc Receiver (DEXCOM G7 RECEIVER) DEVI Use to check blood sugar. E11.65 Patient not taking: Reported on 09/04/2021 08/30/21   Shamleffer, Melanie Crazier, MD  Continuous Blood Gluc Sensor (DEXCOM G7 SENSOR) MISC 1 Device by Does not apply route as directed. Patient not taking: Reported on 09/04/2021 08/24/21   Shamleffer, Melanie Crazier, MD  furosemide (LASIX) 40 MG tablet TAKE 1 TABLET BY MOUTH TWICE A DAY Patient  taking differently: Take 40 mg by mouth See admin instructions. Take one tablet (40 mg) by mouth daily when not going out - approximately 5 times week 05/17/20   Laurey Morale, MD  gabapentin (NEURONTIN) 100 MG capsule Take 100 mg by mouth 2 (two) times daily. Take with a 300 mg capsule for a total dose of 400 mg twice daily 08/31/20   [provider]  gabapentin (NEURONTIN) 300 MG capsule Take 300 mg by mouth 2 (two) times daily. Take with a 100 mg capsule for a total dose of 400 mg twice daily    [provider]  glucose blood (CONTOUR NEXT TEST) test strip Use to test  blood sugar 2 times daily 12/18/16   Renato Shin, MD  glucose blood test strip Use as instructed 08/24/16   Renato Shin, MD  Insulin Pen Needle 31G X 8 MM MISC 1 Device by Does not apply route 3 (three) times daily. 05/11/21   Shamleffer, Melanie Crazier, MD  insulin regular human CONCENTRATED (HUMULIN R U-500 KWIKPEN) 500 UNIT/ML KwikPen Inject 80 Units into the skin as directed. 90 units with breakfast, 90 units with lunch, and 80 units with supper 05/11/21   Shamleffer, Melanie Crazier, MD  levothyroxine (SYNTHROID) 75 MCG tablet TAKE 1 TABLET BY MOUTH EVERY DAY 08/14/21   Laurey Morale, MD  lidocaine (XYLOCAINE) 5 % ointment Apply 1 application topically 2 (two) times daily as needed (pain). 12/23/19   [provider]  metFORMIN (GLUCOPHAGE) 1000 MG tablet TAKE 1 TABLET BY MOUTH EVERY DAY WITH BREAKFAST 05/29/21   Shamleffer, Melanie Crazier, MD  methocarbamol (ROBAXIN) 750 MG tablet Take 1 tablet (750 mg total) by mouth 2 (two) times daily. Patient taking differently: Take 750 mg by mouth every morning. 02/03/15   Laurey Morale, MD  Multiple Vitamin (MULTIVITAMIN WITH MINERALS) TABS tablet Take 1 tablet by mouth daily.    [provider]  Multiple Vitamins-Minerals (AIRBORNE GUMMIES) CHEW Chew 3 tablets by mouth every morning. Patient not taking: Reported on 09/04/2021    [provider]  ondansetron (ZOFRAN) 4 MG tablet Take 1 tablet (4 mg total) by mouth as directed. Take one Zofran pill 30-60 minutes before each colonoscopy prep dose 09/29/20   Armbruster, Carlota Raspberry, MD  pramipexole (MIRAPEX) 0.5 MG tablet TAKE 1 TABLET BY MOUTH EVERYDAY AT BEDTIME 05/29/21   Laurey Morale, MD  rivaroxaban (XARELTO) 20 MG TABS tablet TAKE 1 TABLET BY MOUTH EVERY DAY 05/02/21   Laurey Morale, MD  sertraline (ZOLOFT) 100 MG tablet TAKE 2 TABLETS BY MOUTH AT BEDTIME. 08/14/21   Laurey Morale, MD      Allergies    Vicodin [hydrocodone-acetaminophen]    Review of Systems   Review of  Systems  Constitutional:  Negative for chills and fever.  Respiratory:  Positive for shortness of breath.   Cardiovascular:  Positive for leg swelling. Negative for chest pain and palpitations.  All other systems reviewed and are negative.   Physical Exam Updated Vital Signs BP (!) 132/116 (BP Location: Right Wrist)   Pulse 81   Temp 98.4 F (36.9 C) (Oral)   Resp 20   Ht _0  (1.702 m)   Wt (!) 195 kg   SpO2 97%   BMI 67.33 kg/m  Physical Exam Vitals and nursing note reviewed.  Constitutional:      General: She is not in acute distress.    Appearance: Normal appearance. She is not ill-appearing.  HENT:  Head: Normocephalic and atraumatic.     Nose: Nose normal.  Eyes:     General: No scleral icterus.    Extraocular Movements: Extraocular movements intact.     Conjunctiva/sclera: Conjunctivae normal.  Cardiovascular:     Rate and Rhythm: Normal rate and regular rhythm.     Pulses: Normal pulses.  Pulmonary:     Effort: Pulmonary effort is normal. No respiratory distress.     Breath sounds: Normal breath sounds. No wheezing or rales.  Abdominal:     General: There is no distension.     Palpations: Abdomen is soft.     Tenderness: There is no abdominal tenderness. There is no guarding.  Musculoskeletal:        General: Normal range of motion.     Cervical back: Normal range of motion.     Right lower leg: Edema present.     Left lower leg: Edema present.  Skin:    General: Skin is warm and dry.  Neurological:     General: No focal deficit present.     Mental Status: She is alert. Mental status is at baseline.     ED Results / Procedures / Treatments   Labs (all labs ordered are listed, but only abnormal results are displayed) Labs Reviewed  RESP PANEL BY RT-PCR (FLU A&B, COVID) ARPGX2  CBC WITH DIFFERENTIAL/PLATELET  BASIC METABOLIC PANEL  TROPONIN I (HIGH SENSITIVITY)    EKG None  Radiology No results found.  Procedures Procedures     Medications Ordered in ED Medications - No data to display  ED Course/ Medical Decision Making/ A&P Clinical Course as of 09/04/21 1914  Mon Sep 04, 2021  1901 CBC without leukocytosis or anemia.  BMP shows creatinine of 1.15, glucose of 255 otherwise without acute findings.  Initial troponin of 5.  Given EKG without acute ischemic changes, troponin of 5, and she is without chest pain doubt ACS.  COVID and flu negative. [AA]    Clinical Course User Index [AA] Evlyn Courier, PA-C                           Medical Decision Making Amount and/or Complexity of Data Reviewed Labs: ordered. Radiology: ordered.   Medical Decision Making / ED Course   This patient presents to the ED for concern of leg swelling, pain, shortness of breath, this involves an extensive number of treatment options, and is a complaint that carries with it a high risk of complications and morbidity.  The differential diagnosis includes DVT, PE, pneumonia, ACS  MDM: 56 year old female presents today for evaluation of leg swelling, shortness of breath.  She has prior history of DVT, PE, and is currently on Xarelto.  Reports compliance with this medication.  Denies any recent missed doses.  She recently took a road trip to the beach which was about 4 hours.  On her way back she did not take any breaks.  She also forgot to take her compression stockings and reports has had worsening swelling mostly in the left leg.  Denies chest discomfort.  She is concerned regarding DVT/PE.  Will evaluate with blood work, will add on troponin to rule out ACS, and order CTA chest PE study.  Ultrasound no longer here today.  Chest x-ray ordered.  EKG does not show acute ischemic changes.   Lab Tests: -I ordered, reviewed, and interpreted labs.   The pertinent results include:   Labs Reviewed  RESP PANEL  BY RT-PCR (FLU A&B, COVID) ARPGX2  CBC WITH DIFFERENTIAL/PLATELET  BASIC METABOLIC PANEL  TROPONIN I (HIGH SENSITIVITY)       EKG  EKG Interpretation  Date/Time:    Ventricular Rate:    PR Interval:    QRS Duration:   QT Interval:    QTC Calculation:   R Axis:     Text Interpretation:           Medicines ordered and prescription drug management: No orders of the defined types were placed in this encounter.   -I have reviewed the patients home medicines and have made adjustments as needed  Co morbidities that complicate the patient evaluation . Past Medical History:  Diagnosis Date  . Anxiety    per pt. - mild   . Arthritis    back & ankles   . Asthma    seasonal   . Breast cancer ([place])   . Burning sensation of mouth 10/07/2020  . Cancer [facility])    breast, sees Dr. Jana Hakim , Left - 11/2007  . Candida vaginitis 10/07/2020  . Cellulitis 2020   severe - Left leg   . Complication of anesthesia 2013   severe muscle spasms-sore-no doc  . Depression   . Diabetes mellitus    sees Dr. Chalmers Cater, diagnosed 2003  . HA (headache)   . Hearing loss    Went to [facility],Dr [facility]  . Hyperlipidemia   . Kidney infection    - hosp. [facility]- septic- 10/2009  . Low back pain   . Muscle tear 10/07/2020  . Peripheral neuropathy    R sided numbness- face & jaw  . Pulmonary embolism ([place]) 2020  . Sepsis ([place]) 2020  . Sleep apnea 09/2013   severe-just started cpap-doing well  . Sore throat 10/07/2020  . TIA (transient ischemic attack)    in 2007,2008, and 2011  . Tinnitus of right ear   . Urosepsis 08/2009   with Klebsiella     Dispostion: Patient at the end of my shift is awaiting CTA chest PE study.  Patient signed out to [place] PA-C at the end of my shift for follow-up on imaging and disposition.  If this is negative patient is likely appropriate for discharge and can have an outpatient DVT study done as an outpatient.   Final Clinical Impression(s) / ED Diagnoses Final diagnoses:  None    Rx / DC Orders ED Discharge Orders     None         Evlyn Courier, PA-C 09/04/21 [place], [place], DO 09/04/21 2249

## 2021-09-04 NOTE — Discharge Instructions (Signed)
He will be scheduled for an ultrasound appointment to come back and rule out a DVT of your left lower extremity.  There should be instructions to report exactly what time this will be scheduled for.  There is a number to call if there is nonspecific time on your discharge instructions.  In the meantime continue to take your home medications including your blood thinners.  If you have worsening shortness of breath, chest pain prior to being able to return for a DVT study please return to the emergency department, in the meantime as you do not have any evidence of emergent lung disease or new pulmonary embolism today recommend that you follow-up regarding your shortness of breath with your primary care physician to discuss other possible causes.

## 2021-09-04 NOTE — Patient Instructions (Signed)
Cheyenne Guzman , Thank you for taking time to come for your Medicare Wellness Visit. I appreciate your ongoing commitment to your health goals. Please review the following plan we discussed and let me know if I can assist you in the future.   Screening recommendations/referrals: Colonoscopy: completed 10/10/2020, due 10/10/2025 Mammogram: completed 08/12/2021, due 08/14/2022 Bone Density: n/a Recommended yearly ophthalmology/optometry visit for glaucoma screening and checkup Recommended yearly dental visit for hygiene and checkup  Vaccinations: Influenza vaccine: due Pneumococcal vaccine: n/a Tdap vaccine: due Shingles vaccine: discussed  Covid-19:  02/22/2020, 07/27/2019, 07/06/2019  Advanced directives: Advance directive discussed with you today.   Conditions/risks identified: none  Next appointment: Follow up in one year for your annual wellness visit.   Preventive Care 40-64 Years, Female Preventive care refers to lifestyle choices and visits with your health care provider that can promote health and wellness. What does preventive care include? A yearly physical exam. This is also called an annual well check. Dental exams once or twice a year. Routine eye exams. Ask your health care provider how often you should have your eyes checked. Personal lifestyle choices, including: Daily care of your teeth and gums. Regular physical activity. Eating a healthy diet. Avoiding tobacco and drug use. Limiting alcohol use. Practicing safe sex. Taking low-dose aspirin daily starting at age 80. Taking vitamin and mineral supplements as recommended by your health care provider. What happens during an annual well check? The services and screenings done by your health care provider during your annual well check will depend on your age, overall health, lifestyle risk factors, and family history of disease. Counseling  Your health care provider may ask you questions about your: Alcohol use. Tobacco  use. Drug use. Emotional well-being. Home and relationship well-being. Sexual activity. Eating habits. Work and work Statistician. Method of birth control. Menstrual cycle. Pregnancy history. Screening  You may have the following tests or measurements: Height, weight, and BMI. Blood pressure. Lipid and cholesterol levels. These may be checked every 5 years, or more frequently if you are over 51 years old. Skin check. Lung cancer screening. You may have this screening every year starting at age 69 if you have a 30-pack-year history of smoking and currently smoke or have quit within the past 15 years. Fecal occult blood test (FOBT) of the stool. You may have this test every year starting at age 26. Flexible sigmoidoscopy or colonoscopy. You may have a sigmoidoscopy every 5 years or a colonoscopy every 10 years starting at age 24. Hepatitis C blood test. Hepatitis B blood test. Sexually transmitted disease (STD) testing. Diabetes screening. This is done by checking your blood sugar (glucose) after you have not eaten for a while (fasting). You may have this done every 1-3 years. Mammogram. This may be done every 1-2 years. Talk to your health care provider about when you should start having regular mammograms. This may depend on whether you have a family history of breast cancer. BRCA-related cancer screening. This may be done if you have a family history of breast, ovarian, tubal, or peritoneal cancers. Pelvic exam and Pap test. This may be done every 3 years starting at age 66. Starting at age 56, this may be done every 5 years if you have a Pap test in combination with an HPV test. Bone density scan. This is done to screen for osteoporosis. You may have this scan if you are at high risk for osteoporosis. Discuss your test results, treatment options, and if necessary, the need for  more tests with your health care provider. Vaccines  Your health care provider may recommend certain vaccines,  such as: Influenza vaccine. This is recommended every year. Tetanus, diphtheria, and acellular pertussis (Tdap, Td) vaccine. You may need a Td booster every 10 years. Zoster vaccine. You may need this after age 57. Pneumococcal 13-valent conjugate (PCV13) vaccine. You may need this if you have certain conditions and were not previously vaccinated. Pneumococcal polysaccharide (PPSV23) vaccine. You may need one or two doses if you smoke cigarettes or if you have certain conditions. Talk to your health care provider about which screenings and vaccines you need and how often you need them. This information is not intended to replace advice given to you by your health care provider. Make sure you discuss any questions you have with your health care provider. Document Released: 02/04/2015 Document Revised: 09/28/2015 Document Reviewed: 11/09/2014 Elsevier Interactive Patient Education  2017 [employer] Prevention in the Home Falls can cause injuries. They can happen to people of all ages. There are many things you can do to make your home safe and to help prevent falls. What can I do on the outside of my home? Regularly fix the edges of walkways and driveways and fix any cracks. Remove anything that might make you trip as you walk through a door, such as a raised step or threshold. Trim any bushes or trees on the path to your home. Use bright outdoor lighting. Clear any walking paths of anything that might make someone trip, such as rocks or tools. Regularly check to see if handrails are loose or broken. Make sure that both sides of any steps have handrails. Any raised decks and porches should have guardrails on the edges. Have any leaves, snow, or ice cleared regularly. Use sand or salt on walking paths during winter. Clean up any spills in your garage right away. This includes oil or grease spills. What can I do in the bathroom? Use night lights. Install grab bars by the toilet and  in the tub and shower. Do not use towel bars as grab bars. Use non-skid mats or decals in the tub or shower. If you need to sit down in the shower, use a plastic, non-slip stool. Keep the floor dry. Clean up any water that spills on the floor as soon as it happens. Remove soap buildup in the tub or shower regularly. Attach bath mats securely with double-sided non-slip rug tape. Do not have throw rugs and other things on the floor that can make you trip. What can I do in the bedroom? Use night lights. Make sure that you have a light by your bed that is easy to reach. Do not use any sheets or blankets that are too big for your bed. They should not hang down onto the floor. Have a firm chair that has side arms. You can use this for support while you get dressed. Do not have throw rugs and other things on the floor that can make you trip. What can I do in the kitchen? Clean up any spills right away. Avoid walking on wet floors. Keep items that you use a lot in easy-to-reach places. If you need to reach something above you, use a strong step stool that has a grab bar. Keep electrical cords out of the way. Do not use floor polish or wax that makes floors slippery. If you must use wax, use non-skid floor wax. Do not have throw rugs and other things  on the floor that can make you trip. What can I do with my stairs? Do not leave any items on the stairs. Make sure that there are handrails on both sides of the stairs and use them. Fix handrails that are broken or loose. Make sure that handrails are as long as the stairways. Check any carpeting to make sure that it is firmly attached to the stairs. Fix any carpet that is loose or worn. Avoid having throw rugs at the top or bottom of the stairs. If you do have throw rugs, attach them to the floor with carpet tape. Make sure that you have a light switch at the top of the stairs and the bottom of the stairs. If you do not have them, ask someone to add  them for you. What else can I do to help prevent falls? Wear shoes that: Do not have high heels. Have rubber bottoms. Are comfortable and fit you well. Are closed at the toe. Do not wear sandals. If you use a stepladder: Make sure that it is fully opened. Do not climb a closed stepladder. Make sure that both sides of the stepladder are locked into place. Ask someone to hold it for you, if possible. Clearly mark and make sure that you can see: Any grab bars or handrails. First and last steps. Where the edge of each step is. Use tools that help you move around (mobility aids) if they are needed. These include: Canes. Walkers. Scooters. Crutches. Turn on the lights when you go into a dark area. Replace any light bulbs as soon as they burn out. Set up your furniture so you have a clear path. Avoid moving your furniture around. If any of your floors are uneven, fix them. If there are any pets around you, be aware of where they are. Review your medicines with your doctor. Some medicines can make you feel dizzy. This can increase your chance of falling. Ask your doctor what other things that you can do to help prevent falls. This information is not intended to replace advice given to you by your health care provider. Make sure you discuss any questions you have with your health care provider. Document Released: 11/04/2008 Document Revised: 06/16/2015 Document Reviewed: 02/12/2014 Elsevier Interactive Patient Education  2017 [employer].

## 2021-09-04 NOTE — Progress Notes (Signed)
I connected with Cheyenne Guzman today by telephone and verified that I am speaking with the correct person using two identifiers. Location patient: home Location provider: work Persons participating in the virtual visit: Veroncia, Jezek LPN.   I discussed the limitations, risks, security and privacy concerns of performing an evaluation and management service by telephone and the availability of in person appointments. I also discussed with the patient that there may be a patient responsible charge related to this service. The patient expressed understanding and verbally consented to this telephonic visit.    Interactive audio and video telecommunications were attempted between this provider and patient, however failed, due to patient having technical difficulties OR patient did not have access to video capability.  We continued and completed visit with audio only.     Vital signs may be patient reported or missing.  Subjective:   Cheyenne Guzman is a 56 y.o. female who presents for Medicare Annual (Subsequent) preventive examination.  Review of Systems     Cardiac Risk Factors include: diabetes mellitus;dyslipidemia;obesity (BMI >30kg/m2)     Objective:    Today's Vitals   09/04/21 0926 09/04/21 0927  Weight: (!) 430 lb (195 kg)   Height: '5\' 7"'  (1.702 m)   PainSc:  4    Body mass index is 67.35 kg/m.     09/04/2021    9:38 AM 10/10/2020    8:41 AM 09/19/2020    3:20 PM 09/19/2020    2:34 AM 09/16/2020    9:55 AM 05/31/2020    9:05 PM 02/12/2020   10:55 AM  Advanced Directives  Does Patient Have a Medical Advance Directive? No No No No No No No  Would patient like information on creating a medical advance directive?  No - Patient declined No - Patient declined   No - Patient declined No - Patient declined    Current Medications (verified) Outpatient Encounter Medications as of 09/04/2021  Medication Sig   albuterol (PROVENTIL) (2.5 MG/3ML) 0.083% nebulizer solution  1 VIAL IN NEBULIZER EVERY 4 HOURS AS NEEDED FOR WHEEZING   albuterol (VENTOLIN HFA) 108 (90 Base) MCG/ACT inhaler Inhale 2 puffs into the lungs every 4 (four) hours as needed for wheezing or shortness of breath.   atorvastatin (LIPITOR) 10 MG tablet TAKE 1 TABLET BY MOUTH EVERY DAY   Blood Glucose Monitoring Suppl (CONTOUR NEXT MONITOR) w/Device KIT 1 each by Does not apply route 2 (two) times daily. To check blood sugars twice a day.   Buprenorphine 15 MCG/HR PTWK Place 1 patch onto the skin every Monday. Uses 20 mcg at this time   cefadroxil (DURICEF) 500 MG capsule Take 1 capsule (500 mg total) by mouth 2 (two) times daily. Take at first signs of recurrent cellulitis (Patient taking differently: Take 500 mg by mouth as needed. Take at first signs of recurrent cellulitis)   Cholecalciferol (VITAMIN D3) 50 MCG (2000 UT) TABS Take 2,000 Units by mouth every morning.   furosemide (LASIX) 40 MG tablet TAKE 1 TABLET BY MOUTH TWICE A DAY (Patient taking differently: Take 40 mg by mouth See admin instructions. Take one tablet (40 mg) by mouth daily when not going out - approximately 5 times week)   gabapentin (NEURONTIN) 100 MG capsule Take 100 mg by mouth 2 (two) times daily. Take with a 300 mg capsule for a total dose of 400 mg twice daily   gabapentin (NEURONTIN) 300 MG capsule Take 300 mg by mouth 2 (two) times daily. Take with a  100 mg capsule for a total dose of 400 mg twice daily   glucose blood (CONTOUR NEXT TEST) test strip Use to test blood sugar 2 times daily   glucose blood test strip Use as instructed   Insulin Pen Needle 31G X 8 MM MISC 1 Device by Does not apply route 3 (three) times daily.   insulin regular human CONCENTRATED (HUMULIN R U-500 KWIKPEN) 500 UNIT/ML KwikPen Inject 80 Units into the skin as directed. 90 units with breakfast, 90 units with lunch, and 80 units with supper   levothyroxine (SYNTHROID) 75 MCG tablet TAKE 1 TABLET BY MOUTH EVERY DAY   lidocaine (XYLOCAINE) 5 %  ointment Apply 1 application topically 2 (two) times daily as needed (pain).   metFORMIN (GLUCOPHAGE) 1000 MG tablet TAKE 1 TABLET BY MOUTH EVERY DAY WITH BREAKFAST   methocarbamol (ROBAXIN) 750 MG tablet Take 1 tablet (750 mg total) by mouth 2 (two) times daily. (Patient taking differently: Take 750 mg by mouth every morning.)   Multiple Vitamin (MULTIVITAMIN WITH MINERALS) TABS tablet Take 1 tablet by mouth daily.   pramipexole (MIRAPEX) 0.5 MG tablet TAKE 1 TABLET BY MOUTH EVERYDAY AT BEDTIME   rivaroxaban (XARELTO) 20 MG TABS tablet TAKE 1 TABLET BY MOUTH EVERY DAY   sertraline (ZOLOFT) 100 MG tablet TAKE 2 TABLETS BY MOUTH AT BEDTIME.   Continuous Blood Gluc Receiver (DEXCOM G7 RECEIVER) DEVI Use to check blood sugar. E11.65 (Patient not taking: Reported on 09/04/2021)   Continuous Blood Gluc Sensor (DEXCOM G7 SENSOR) MISC 1 Device by Does not apply route as directed. (Patient not taking: Reported on 09/04/2021)   Multiple Vitamins-Minerals (AIRBORNE GUMMIES) CHEW Chew 3 tablets by mouth every morning. (Patient not taking: Reported on 09/04/2021)   ondansetron (ZOFRAN) 4 MG tablet Take 1 tablet (4 mg total) by mouth as directed. Take one Zofran pill 30-60 minutes before each colonoscopy prep dose   No facility-administered encounter medications on file as of 09/04/2021.    Allergies (verified) Vicodin [hydrocodone-acetaminophen]   History: Past Medical History:  Diagnosis Date   Anxiety    per pt. - mild    Arthritis    back & ankles    Asthma    seasonal    Breast cancer (HCC)    Burning sensation of mouth 10/07/2020   Cancer ([place])    breast, sees Dr. Jana Hakim , Left - 11/2007   Candida vaginitis 10/07/2020   Cellulitis 2020   severe - Left leg    Complication of anesthesia 2013   severe muscle spasms-sore-no doc   Depression    Diabetes mellitus    sees Dr. Chalmers Cater, diagnosed 2003   HA (headache)    Hearing loss    Went to [facility],Dr Laing,both ears    Hyperlipidemia    Kidney infection    - hosp. [facility]- septic- 10/2009   Low back pain    Muscle tear 10/07/2020   Peripheral neuropathy    R sided numbness- face & jaw   Pulmonary embolism ([place]) 2020   Sepsis ([place]) 2020   Sleep apnea 09/2013   severe-just started cpap-doing well   Sore throat 10/07/2020   TIA (transient ischemic attack)    in 2007,2008, and 2011   Tinnitus of right ear    Urosepsis 08/2009   with Klebsiella   Past Surgical History:  Procedure Laterality Date   ABDOMINAL HYSTERECTOMY  08/2004   BREAST BIOPSY  06/22/2011   Procedure: BREAST BIOPSY;  Surgeon: Stark Klein,  MD;  Location: [place];  Service: General;  Laterality: Right;   BREAST LUMPECTOMY     BREAST SURGERY  11/2007   left ductal carcinoma in situ   CARPAL TUNNEL RELEASE Right 10/13/2013   Procedure: RIGHT CARPAL TUNNEL RELEASE;  Surgeon: Hessie Dibble, MD;  Location: [place];  Service: Orthopedics;  Laterality: Right;   CARPAL TUNNEL RELEASE Left 11/24/2013   Procedure: LEFT CARPAL TUNNEL RELEASE WITH CYST EXCISION ;  Surgeon: Hessie Dibble, MD;  Location: [place];  Service: Orthopedics;  Laterality: Left;   COLONOSCOPY WITH PROPOFOL N/A 10/10/2020   Procedure: COLONOSCOPY WITH PROPOFOL;  Surgeon: Yetta Flock, MD;  Location: [facility];  Service: Gastroenterology;  Laterality: N/A;   EAR CYST EXCISION Left 11/24/2013   Procedure: CYST REMOVAL;  Surgeon: Hessie Dibble, MD;  Location: [place];  Service: Orthopedics;  Laterality: Left;   FOOT SURGERY  2000   plantar fasciitis-left   LUMBAR DISC SURGERY  03-29-14   Fusion, revision 04-27-14 L4-5 Cauda Equina with graft   NERVE GRAFT  06-01-08   cadaver graft to right inferior alveolar nerve  in [place]   POLYPECTOMY  10/10/2020   Procedure: POLYPECTOMY;  Surgeon: Yetta Flock, MD;  Location: [facility];  Service: Gastroenterology;;   Family History  Problem Relation Age of  Onset   Cancer Mother        breast   Cancer Father        lung   Diabetes Father    Colon cancer Sister    Cancer Sister 49       colon   Diabetes Sister    Cancer Maternal Aunt        breast - both   Anesthesia problems Neg Hx    Social History   Socioeconomic History   Marital status: Married    Spouse name: [name]   Number of children: 2   Years of education: 14   Highest education level: Not on file  Occupational History    Comment: [employer]  Tobacco Use   Smoking status: Former    Types: Cigarettes    Quit date: 02/19/1992    Years since quitting: 29.5   Smokeless tobacco: Never  Vaping Use   Vaping Use: Never used  Substance and Sexual Activity   Alcohol use: Not Currently    Comment: rare   Drug use: No   Sexual activity: Not on file  Other Topics Concern   Not on file  Social History Narrative   Patient lives at home with her husband [name]) and two daughters.    Patient works for [employer] full time patient is on medical leave at this time.   Education two years of college.   Right handed.   Caffeine two times per day.   Social Determinants of Health   Financial Resource Strain: Medium Risk (09/04/2021)   Overall Financial Resource Strain (CARDIA)    Difficulty of Paying Living Expenses: Somewhat hard  Food Insecurity: No Food Insecurity (09/04/2021)   Hunger Vital Sign    Worried About Running Out of Food in the Last Year: Never true    Ran Out of Food in the Last Year: Never true  Transportation Needs: No Transportation Needs (09/04/2021)   PRAPARE - Hydrologist (Medical): No    Lack of Transportation (Non-Medical): No  Physical Activity: Sufficiently Active (09/04/2021)   Exercise Vital Sign  Days of Exercise per Week: 5 days    Minutes of Exercise per Session: 150+ min  Stress: No Stress Concern Present (09/04/2021)   [name]     Feeling of Stress : Only a little  Social Connections: Moderately Integrated (02/12/2020)   Social Connection and Isolation Panel [NHANES]    Frequency of Communication with Friends and Family: More than three times a week    Frequency of Social Gatherings with Friends and Family: More than three times a week    Attends Religious Services: More than 4 times per year    Active Member of [employer] or Organizations: No    Attends Music therapist: Never    Marital Status: Married    Tobacco Counseling Counseling given: Not Answered   Clinical Intake:  Pre-visit preparation completed: Yes  Pain : 0-10 Pain Score: 4  Pain Type: Chronic pain Pain Location: Generalized Pain Descriptors / Indicators: Aching Pain Onset: More than a month ago Pain Frequency: Constant     Nutritional Status: BMI > 30  Obese Nutritional Risks: None Diabetes: Yes  How often do you need to have someone help you when you read instructions, pamphlets, or other written materials from your doctor or pharmacy?: 1 - Never What is the last grade level you completed in school?: 14 yrs  Diabetic? Yes Nutrition Risk Assessment:  Has the patient had any N/V/D within the last 2 months?  No  Does the patient have any non-healing wounds?  No  Has the patient had any unintentional weight loss or weight gain?  No   Diabetes:  Is the patient diabetic?  Yes  If diabetic, was a CBG obtained today?  No  Did the patient bring in their glucometer from home?  No  How often do you monitor your CBG's? Every other day.   Financial Strains and Diabetes Management:  Are you having any financial strains with the device, your supplies or your medication? Yes .  Does the patient want to be seen by Chronic Care Management for management of their diabetes?   Working with pharmacist Would the patient like to be referred to a Nutritionist or for Diabetic Management?  No   Diabetic Exams:  Diabetic Eye Exam: Overdue  for diabetic eye exam. Pt has been advised about the importance in completing this exam. Patient advised to call and schedule an eye exam. Diabetic Foot Exam: Overdue, Pt has been advised about the importance in completing this exam. Pt is scheduled for diabetic foot exam on next appointment.   Interpreter Needed?: No  Information entered by :: NAllen LPN   Activities of Daily Living    09/04/2021    9:40 AM 09/19/2020    3:24 PM  In your present state of health, do you have any difficulty performing the following activities:  Hearing? 1   Comment does not have hearing aids   Vision? 0   Difficulty concentrating or making decisions? 0   Walking or climbing stairs? 1   Dressing or bathing? 1   Comment needs help with compression hose   Doing errands, shopping? 1 1  Preparing Food and eating ? N   Using the Toilet? N   In the past six months, have you accidently leaked urine? Y   Do you have problems with loss of bowel control? N   Managing your Medications? N   Managing your Finances? N   Housekeeping or managing your Housekeeping? Darreld Mclean  Patient Care Team: Laurey Morale, MD as PCP - General [place], Nadean Corwin, MD as PCP - Cardiology (Cardiology) Izora Gala, MD as Consulting Physician (Otolaryngology) Keene Breath., MD (Ophthalmology) Viona Gilmore, [facility] as Pharmacist (Pharmacist)  Indicate any recent Medical Services you may have received from other than [facility] providers in the past year (date may be approximate).     Assessment:   This is a routine wellness examination for [facility].  Hearing/Vision screen Vision Screening - Comments:: Regular eye exams,  Dietary issues and exercise activities discussed: Current Exercise Habits: Home exercise routine, Type of exercise: Other - see comments (in the pool), Time (Minutes): > 60, Frequency (Times/Week): 7, Weekly Exercise (Minutes/Week): 0   Goals Addressed             This Visit's Progress    Patient Stated        09/04/2021, stay in pool as much as possible and strengthen legs       Depression Screen    09/04/2021    9:40 AM 08/10/2021   10:30 AM 02/12/2020   10:58 AM 08/12/2018    9:56 AM  PHQ 2/9 Scores  PHQ - 2 Score 0 0 1 0  PHQ- 9 Score  0 10     Fall Risk    09/04/2021    9:39 AM 08/10/2021   10:29 AM 10/07/2020   10:48 AM 02/12/2020   10:57 AM 08/12/2018    9:56 AM  Fall Risk   Falls in the past year? 0 0 0 0 0  Number falls in past yr: 0 0 0 0 0  Injury with Fall? 0 0 0 0 0  Risk for fall due to : Impaired balance/gait;Impaired mobility;Medication side effect Impaired mobility Impaired mobility Impaired mobility   Follow up Falls evaluation completed;Education provided;Falls prevention discussed Falls evaluation completed  Falls evaluation completed;Falls prevention discussed     FALL RISK PREVENTION PERTAINING TO THE HOME:  Any stairs in or around the home? No  If so, are there any without handrails? N/a Home free of loose throw rugs in walkways, pet beds, electrical cords, etc? Yes  Adequate lighting in your home to reduce risk of falls? Yes   ASSISTIVE DEVICES UTILIZED TO PREVENT FALLS:  Life alert? No  Use of a cane, walker or w/c? Yes  Grab bars in the bathroom? Yes  Shower chair or bench in shower? Yes  Elevated toilet seat or a handicapped toilet? Yes   TIMED UP AND GO:  Was the test performed? No .      Cognitive Function:        09/04/2021    9:42 AM  6CIT Screen  What Year? 0 points  What month? 0 points  What time? 0 points  Count back from 20 0 points  Months in reverse 0 points  Repeat phrase 2 points  Total Score 2 points    Immunizations Immunization History  Administered Date(s) Administered   Influenza Whole 10/23/2006   Influenza, Quadrivalent, Recombinant, Inj, Pf 10/20/2017   Influenza,inj,Quad PF,6+ Mos 11/28/2012, 03/05/2014, 11/04/2018   Influenza-Unspecified 02/03/2020   PFIZER(Purple Top)SARS-COV-2 Vaccination 07/06/2019,  07/27/2019   Pneumococcal Polysaccharide-23 11/23/2006, 11/28/2012    TDAP status: Due, Education has been provided regarding the importance of this vaccine. Advised may receive this vaccine at local pharmacy or Health Dept. Aware to provide a copy of the vaccination record if obtained from local pharmacy or Health Dept. Verbalized acceptance and understanding.  Flu Vaccine status: Due,  Education has been provided regarding the importance of this vaccine. Advised may receive this vaccine at local pharmacy or Health Dept. Aware to provide a copy of the vaccination record if obtained from local pharmacy or Health Dept. Verbalized acceptance and understanding.  Pneumococcal vaccine status: Up to date  Covid-19 vaccine status: Completed vaccines  Qualifies for Shingles Vaccine? Yes   Zostavax completed No   Shingrix Completed?: No.    Education has been provided regarding the importance of this vaccine. Patient has been advised to call insurance company to determine out of pocket expense if they have not yet received this vaccine. Advised may also receive vaccine at local pharmacy or Health Dept. Verbalized acceptance and understanding.  Screening Tests Health Maintenance  Topic Date Due   OPHTHALMOLOGY EXAM  Never done   Hepatitis C Screening  Never done   TETANUS/TDAP  Never done   Zoster Vaccines- Shingrix (1 of 2) Never done   PAP SMEAR-Modifier  Never done   FOOT EXAM  10/17/2018   URINE MICROALBUMIN  11/05/2019   COVID-19 Vaccine (4 - Pfizer risk series) 04/18/2020   INFLUENZA VACCINE  08/22/2021   HEMOGLOBIN A1C  11/10/2021   MAMMOGRAM  08/13/2023   COLONOSCOPY (Pts 45-19yr Insurance coverage will need to be confirmed)  10/11/2030   HIV Screening  Completed   HPV VACCINES  Aged Out    Health Maintenance  Health Maintenance Due  Topic Date Due   OPHTHALMOLOGY EXAM  Never done   Hepatitis C Screening  Never done   TETANUS/TDAP  Never done   Zoster Vaccines- Shingrix (1 of  2) Never done   PAP SMEAR-Modifier  Never done   FOOT EXAM  10/17/2018   URINE MICROALBUMIN  11/05/2019   COVID-19 Vaccine (4 - Pfizer risk series) 04/18/2020   INFLUENZA VACCINE  08/22/2021    Colorectal cancer screening: Type of screening: Colonoscopy. Completed 10/10/2020. Repeat every 5 years  Mammogram status: Completed 08/12/2021. Repeat every year  Bone Density status: n/a  Lung Cancer Screening: (Low Dose CT Chest recommended if Age 57-80years, 30 pack-year currently smoking OR have quit w/in 15years.) does not qualify.   Lung Cancer Screening Referral: no  Additional Screening:  Hepatitis C Screening: does qualify;  Vision Screening: Recommended annual ophthalmology exams for early detection of glaucoma and other disorders of the eye. Is the patient up to date with their annual eye exam?  No  Who is the provider or what is the name of the office in which the patient attends annual eye exams? [facility] If pt is not established with a provider, would they like to be referred to a provider to establish care? No .   Dental Screening: Recommended annual dental exams for proper oral hygiene  Community Resource Referral / Chronic Care Management: CRR required this visit?  No   CCM required this visit?  No      Plan:     I have personally reviewed and noted the following in the patient's chart:   Medical and social history Use of alcohol, tobacco or illicit drugs  Current medications and supplements including opioid prescriptions.  Functional ability and status Nutritional status Physical activity Advanced directives List of other physicians Hospitalizations, surgeries, and ER visits in previous 12 months Vitals Screenings to include cognitive, depression, and falls Referrals and appointments  In addition, I have reviewed and discussed with patient certain preventive protocols, quality metrics, and best practice recommendations. A written personalized care plan  for preventive  services as well as general preventive health recommendations were provided to patient.     Kellie Simmering, LPN   03/04/1550   Nurse Notes: none  Due to this being a virtual visit, the after visit summary with patients personalized plan was offered to patient via mail or my-chart.  Patient would like to access on my-chart

## 2021-09-04 NOTE — ED Provider Notes (Signed)
Accepted handoff at shift change from [facility]. Please see prior provider note for more detail.   Briefly: Patient is 56 y.o.   DDX: concern for shortness of breath, new left knee pain and swelling.  Patient with history of previous PE and DVT.  She is already on anticoagulation.  We do not have ultrasound the pharmacist today.  We will obtain lab work, CTA to rule out PE.  She is pending CTA PE study at this time.  If negative she is still going to return in the morning for ultrasound to rule out DVT.  She is stable on room air, otherwise vital signs stable, nontachycardic at this time.  Plan: I independently interpreted imaging including CTA PE study which shows no evidence of pulmonary embolism or other emergent lung disease. I agree with the radiologist interpretation.  Patient scheduled for return for left lower extremity DVT study on the request of the outgoing provider.  She is in no respiratory distress, continues to maintain stable oxygen saturations.  Encouraged her to follow-up with her primary care if she is continuing to have shortness of breath without explainable cause.  She is discharged in stable condition at this time, extensive return precautions are given.     Dorien Chihuahua 09/04/21 2034    Deno Etienne, DO 09/04/21 2249

## 2021-09-04 NOTE — ED Triage Notes (Signed)
Pt endorses pain to left knee for a few days, recently at beach. Pain starts behind her knee and goes up the thigh. Also endorses some SOB for a few days.

## 2021-09-05 ENCOUNTER — Other Ambulatory Visit: Payer: Self-pay

## 2021-09-05 ENCOUNTER — Ambulatory Visit (HOSPITAL_BASED_OUTPATIENT_CLINIC_OR_DEPARTMENT_OTHER)
Admission: RE | Admit: 2021-09-05 | Discharge: 2021-09-05 | Disposition: A | Discharge status: Home / Self Care | Payer: Medicare Other | Source: Ambulatory Visit | Attending: Family Medicine | Admitting: Family Medicine

## 2021-09-05 DIAGNOSIS — M79662 Pain in left lower leg: Secondary | ICD-10-CM | POA: Diagnosis not present

## 2021-09-05 DIAGNOSIS — R600 Localized edema: Secondary | ICD-10-CM | POA: Diagnosis not present

## 2021-09-05 DIAGNOSIS — M79605 Pain in left leg: Secondary | ICD-10-CM | POA: Insufficient documentation

## 2021-09-05 DIAGNOSIS — I82502 Chronic embolism and thrombosis of unspecified deep veins of left lower extremity: Secondary | ICD-10-CM | POA: Insufficient documentation

## 2021-09-05 NOTE — Telephone Encounter (Signed)
I did the referral again

## 2021-09-05 NOTE — Telephone Encounter (Signed)
-----   Message from Kellie Simmering, LPN sent at 2/67/1245 10:29 AM EDT ----- Good morning Dr. Sarajane Jews, Mrs. Karim states that the rheumatology referral placed went to a provider that is no longer in the office. She needs a new referral sent. The provider it was sent to is at a dermatology office. Thanks

## 2021-09-11 ENCOUNTER — Other Ambulatory Visit: Payer: Self-pay | Admitting: Internal Medicine

## 2021-09-11 DIAGNOSIS — E785 Hyperlipidemia, unspecified: Secondary | ICD-10-CM

## 2021-09-19 ENCOUNTER — Other Ambulatory Visit: Payer: Self-pay | Admitting: *Deleted

## 2021-09-19 NOTE — Patient Outreach (Signed)
  Care Coordination   09/19/2021 Name: SHALICE WOODRING MRN: [id number] DOB: December 09, 1965   Care Coordination Outreach Attempts:  An unsuccessful telephone outreach was attempted today to offer the patient information about available care coordination services as a benefit of their health plan.   Follow Up Plan:  Additional outreach attempts will be made to offer the patient care coordination information and services.   Encounter Outcome:  No Answer  Care Coordination Interventions Activated:  No   Care Coordination Interventions:  No, not indicated     Raina Mina, RN Care Management Coordinator [place] Office [phone]

## 2021-09-20 ENCOUNTER — Telehealth: Payer: Self-pay | Admitting: Family Medicine

## 2021-09-20 NOTE — Telephone Encounter (Signed)
Done

## 2021-09-20 NOTE — Telephone Encounter (Signed)
Cheyenne Guzman from Rheumatology requesting office notes for referral [phone]

## 2021-09-21 NOTE — Telephone Encounter (Signed)
Sandy from [facility] is requesting office notes and labs for the last 3 months for referral [phone]   Fax:  [phone]

## 2021-09-22 ENCOUNTER — Other Ambulatory Visit: Payer: Self-pay | Admitting: Family Medicine

## 2021-09-27 NOTE — Telephone Encounter (Signed)
Pt notes have been faxed to [facility] as requested

## 2021-09-27 NOTE — Telephone Encounter (Signed)
Pt Office notes and lab results requested office notes have been sent to [facility]

## 2021-10-12 ENCOUNTER — Ambulatory Visit: Payer: Self-pay

## 2021-10-12 NOTE — Patient Outreach (Signed)
  Care Coordination   10/12/2021 Name: Cheyenne Guzman MRN: [id number] DOB: August 20, 1965   Care Coordination Outreach Attempts:  A second unsuccessful outreach was attempted today to offer the patient with information about available care coordination services as a benefit of their health plan.     Follow Up Plan:  Additional outreach attempts will be made to offer the patient care coordination information and services.   Encounter Outcome:  No Answer  Care Coordination Interventions Activated:  No   Care Coordination Interventions:  No, not indicated    Daneen Schick, BSW, CDP Social Worker, Certified Dementia Practitioner [facility] Care Management  Care Coordination [phone]

## 2021-10-17 ENCOUNTER — Telehealth: Payer: Self-pay | Admitting: *Deleted

## 2021-10-17 NOTE — Patient Outreach (Signed)
  Care Coordination   10/17/2021 Name: Cheyenne Guzman MRN: [id number] DOB: 07/23/1965   Care Coordination Outreach Attempts:  A third unsuccessful outreach was attempted today to offer the patient with information about available care coordination services as a benefit of their health plan.   Follow Up Plan:  No further outreach attempts will be made at this time. We have been unable to contact the patient to offer or enroll patient in care coordination services  Encounter Outcome:  No Answer  Care Coordination Interventions Activated:  No   Care Coordination Interventions:  No, not indicated    Raina Mina, RN Care Management Coordinator [place] [facility] [phone]

## 2021-11-01 NOTE — Progress Notes (Unsigned)
Name: Cheyenne Guzman  Age/ Sex: 56 y.o., female   MRN/ DOB: [id number], 07-25-1965     PCP: Laurey Morale, MD   Reason for Endocrinology Evaluation: Type 2 Diabetes Mellitus  Initial Endocrine Consultative Visit: 08/06/2016    PATIENT IDENTIFIER: [name] is a 56 y.o. female with a past medical history of T2DM, HTN, OSA. The patient has followed with Endocrinology clinic since 08/06/2016 for consultative assistance with management of her diabetes.  DIABETIC HISTORY:  Cheyenne Guzman was diagnosed with T2DM in 2000. Has been on Metformin without side effects  .Has been on insulin since 2009. Her hemoglobin A1c has ranged from 8.2% in 2017, peaking at 10.3% in 2019.  Re-established with me 08/2018. Stopped lantus and started Novolog mix as well as metformin.   Attempted to prescribe Ozempic in 2021 but it was cost prohibitive  By September 2022 we will switch Humalog mix to Humulin U-500  SUBJECTIVE:   Today (11/08/2021): Cheyenne Guzman is here for a follow up on diabetes management .  She is accompanied by her daughter [name]. She checks her blood sugars multiple times a day through the dexcom .    She presented to the ED on 09/04/2021 for left leg swelling, no evidence of acute or chronic DVT  Today she is tearful and frustrated with her glucose control despite not eating at times     HOME REGIMEN:  Metformin 500 mg, with breakfast  Humulin U-500 , 80 units with breakfast, 80 units with lunch, and 70 units with supper- 90/90/80 Lipitor 10 mg daily      Statin: yes ACE-I/ARB: no Prior Diabetic Education: Yes  CONTINUOUS GLUCOSE MONITORING RECORD INTERPRETATION    Dates of Recording: 10/5-10/18/2023  Sensor description: dexcom  Results statistics:   CGM use % of time 21  Average and SD 222/49  Time in range      23  %  % Time Above 180 51  % Time above 250 260  % Time Below target    Glycemic patterns summary: Bg's trend down overnight but high during the  day   Hyperglycemic episodes  postprandial   Hypoglycemic episodes occurred n/a  Overnight periods: trends down    DIABETIC COMPLICATIONS: Microvascular complications:  Neuropathy, ? Mild retinopathy  Denies: CKD Last Eye Exam: Completed 11/2020  Macrovascular complications:  TIA Denies: CAD, CVA, PVD   HISTORY:  Past Medical History:  Past Medical History:  Diagnosis Date   Anxiety    per pt. - mild    Arthritis    back & ankles    Asthma    seasonal    Breast cancer (HCC)    Burning sensation of mouth 10/07/2020   Cancer ([place])    breast, sees Dr. Jana Hakim , Left - 11/2007   Candida vaginitis 10/07/2020   Cellulitis 2020   severe - Left leg    Complication of anesthesia 2013   severe muscle spasms-sore-no doc   Depression    Diabetes mellitus    sees Dr. Chalmers Cater, diagnosed 2003   HA (headache)    Hearing loss    Went to [facility],Dr Laing,both ears   Hyperlipidemia    Kidney infection    - hosp. [facility]- septic- 10/2009   Low back pain    Muscle tear 10/07/2020   Peripheral neuropathy    R sided numbness- face & jaw   Pulmonary embolism ([place]) 2020   Sepsis ([place]) 2020   Sleep apnea  09/2013   severe-just started cpap-doing well   Sore throat 10/07/2020   TIA (transient ischemic attack)    in 2007,2008, and 2011   Tinnitus of right ear    Urosepsis 08/2009   with Klebsiella   Past Surgical History:  Past Surgical History:  Procedure Laterality Date   ABDOMINAL HYSTERECTOMY  08/2004   BREAST BIOPSY  06/22/2011   Procedure: BREAST BIOPSY;  Surgeon: Stark Klein, MD;  Location: [place];  Service: General;  Laterality: Right;   BREAST LUMPECTOMY     BREAST SURGERY  11/2007   left ductal carcinoma in situ   CARPAL TUNNEL RELEASE Right 10/13/2013   Procedure: RIGHT CARPAL TUNNEL RELEASE;  Surgeon: Hessie Dibble, MD;  Location: [place];  Service: Orthopedics;  Laterality: Right;   CARPAL TUNNEL RELEASE Left 11/24/2013    Procedure: LEFT CARPAL TUNNEL RELEASE WITH CYST EXCISION ;  Surgeon: Hessie Dibble, MD;  Location: [place];  Service: Orthopedics;  Laterality: Left;   COLONOSCOPY WITH PROPOFOL N/A 10/10/2020   Procedure: COLONOSCOPY WITH PROPOFOL;  Surgeon: Yetta Flock, MD;  Location: [facility];  Service: Gastroenterology;  Laterality: N/A;   EAR CYST EXCISION Left 11/24/2013   Procedure: CYST REMOVAL;  Surgeon: Hessie Dibble, MD;  Location: [place];  Service: Orthopedics;  Laterality: Left;   FOOT SURGERY  2000   plantar fasciitis-left   LUMBAR DISC SURGERY  03-29-14   Fusion, revision 04-27-14 L4-5 Cauda Equina with graft   NERVE GRAFT  06-01-08   cadaver graft to right inferior alveolar nerve  in [place]   POLYPECTOMY  10/10/2020   Procedure: POLYPECTOMY;  Surgeon: Yetta Flock, MD;  Location: [facility];  Service: Gastroenterology;;   Social History:  reports that she quit smoking about 29 years ago. Her smoking use included cigarettes. She has never used smokeless tobacco. She reports that she does not currently use alcohol. She reports that she does not use drugs. Family History:  Family History  Problem Relation Age of Onset   Cancer Mother        breast   Cancer Father        lung   Diabetes Father    Colon cancer Sister    Cancer Sister 44       colon   Diabetes Sister    Cancer Maternal Aunt        breast - both   Anesthesia problems Neg Hx      HOME MEDICATIONS: Allergies as of 11/08/2021       Reactions   Vicodin [hydrocodone-acetaminophen] Nausea And Vomiting        Medication List        Accurate as of November 08, 2021 11:38 AM. If you have any questions, ask your nurse or doctor.          Airborne Gummies Google 3 tablets by mouth every morning.   albuterol (2.5 MG/3ML) 0.083% nebulizer solution Commonly known as: PROVENTIL 1 VIAL IN NEBULIZER EVERY 4 HOURS AS NEEDED FOR WHEEZING   albuterol 108  (90 Base) MCG/ACT inhaler Commonly known as: VENTOLIN HFA Inhale 2 puffs into the lungs every 4 (four) hours as needed for wheezing or shortness of breath.   atorvastatin 10 MG tablet Commonly known as: LIPITOR TAKE 1 TABLET BY MOUTH EVERY DAY   buprenorphine 15 MCG/HR Commonly known as: BUTRANS Place 1 patch onto the skin every Monday. Uses 20 mcg at this time  cefadroxil 500 MG capsule Commonly known as: DURICEF Take 1 capsule (500 mg total) by mouth 2 (two) times daily. Take at first signs of recurrent cellulitis What changed:  when to take this reasons to take this   Contour Next Monitor w/Device Kit 1 each by Does not apply route 2 (two) times daily. To check blood sugars twice a day.   Dexcom G7 Receiver Devi Use to check blood sugar. E11.65   Dexcom G7 Sensor Misc 1 Device by Does not apply route as directed.   furosemide 40 MG tablet Commonly known as: LASIX TAKE 1 TABLET BY MOUTH TWICE A DAY What changed:  when to take this additional instructions   gabapentin 300 MG capsule Commonly known as: NEURONTIN Take 300 mg by mouth 2 (two) times daily. Take with a 100 mg capsule for a total dose of 400 mg twice daily   gabapentin 100 MG capsule Commonly known as: NEURONTIN Take 100 mg by mouth 2 (two) times daily. Take with a 300 mg capsule for a total dose of 400 mg twice daily   glucose blood test strip Use as instructed   glucose blood test strip Commonly known as: Contour Next Test Use to test blood sugar 2 times daily   HumuLIN R U-500 KwikPen 500 UNIT/ML KwikPen Generic drug: insulin regular human CONCENTRATED Inject 80 Units into the skin as directed. 90 units with breakfast, 90 units with lunch, and 80 units with supper   Insulin Pen Needle 31G X 8 MM Misc 1 Device by Does not apply route 3 (three) times daily.   levothyroxine 75 MCG tablet Commonly known as: SYNTHROID TAKE 1 TABLET BY MOUTH EVERY DAY   lidocaine 5 % ointment Commonly known as:  XYLOCAINE Apply 1 application topically 2 (two) times daily as needed (pain).   metFORMIN 1000 MG tablet Commonly known as: GLUCOPHAGE TAKE 1 TABLET BY MOUTH EVERY DAY WITH BREAKFAST   methocarbamol 750 MG tablet Commonly known as: ROBAXIN Take 1 tablet (750 mg total) by mouth 2 (two) times daily. What changed: when to take this   multivitamin with minerals Tabs tablet Take 1 tablet by mouth daily.   ondansetron 4 MG tablet Commonly known as: ZOFRAN Take 1 tablet (4 mg total) by mouth as directed. Take one Zofran pill 30-60 minutes before each colonoscopy prep dose   pramipexole 0.5 MG tablet Commonly known as: MIRAPEX TAKE 1 TABLET BY MOUTH EVERYDAY AT BEDTIME   sertraline 100 MG tablet Commonly known as: ZOLOFT TAKE 2 TABLETS BY MOUTH AT BEDTIME.   Vitamin D3 50 MCG (2000 UT) Tabs Take 2,000 Units by mouth every morning.   Xarelto 20 MG Tabs tablet Generic drug: rivaroxaban TAKE 1 TABLET BY MOUTH EVERY DAY         OBJECTIVE:   Vital Signs: BP 136/82 (BP Location: Left Wrist, Patient Position: Sitting, Cuff Size: Large)   Pulse 78   Ht _0  (1.702 m)   Wt (!) 446 lb (202.3 kg)   SpO2 95%   BMI 69.85 kg/m   Wt Readings from Last 3 Encounters:  11/08/21 (!) 446 lb (202.3 kg)  09/04/21 (!) 429 lb 14.4 oz (195 kg)  09/04/21 (!) 430 lb (195 kg)     Exam: General: Pt appears well and is in NAD, in a wheelchair  Lungs: Clear with good BS bilat with no rales, rhonchi, or wheezes  Heart: RRR  Extremities: Trace edema   Neuro: MS is good with appropriate affect, pt is  alert and Ox3    DM foot exam: Unable to perform as the patient is wearing compression stockings   DATA REVIEWED:  Lab Results  Component Value Date   HGBA1C 8.8 (A) 11/08/2021   HGBA1C 9.6 (A) 05/11/2021   HGBA1C 10.6 (H) 09/20/2020     Results for Cheyenne Guzman, Cheyenne Guzman (MRN [id number]) as of 10/14/2020 08:19  Ref. Range 09/23/2020 05:27  Sodium Latest Ref Range: 135 - 145 mmol/L 136   Potassium Latest Ref Range: 3.5 - 5.1 mmol/L 5.0  Chloride Latest Ref Range: 98 - 111 mmol/L 99  CO2 Latest Ref Range: 22 - 32 mmol/L 26  Glucose Latest Ref Range: 70 - 99 mg/dL 198 (H)  BUN Latest Ref Range: 6 - 20 mg/dL 15  Creatinine Latest Ref Range: 0.44 - 1.00 mg/dL 0.92  Calcium Latest Ref Range: 8.9 - 10.3 mg/dL 9.0  Anion gap Latest Ref Range: 5 - 15  11  Magnesium Latest Ref Range: 1.7 - 2.4 mg/dL 1.7  GFR, Estimated Latest Ref Range: >60 mL/min >60      ASSESSMENT / PLAN / RECOMMENDATIONS:   1) Type 2 Diabetes Mellitus, Poorly  controlled, With retinopathic and neuropathic complications - Most recent A1c of 9.6 %. Goal A1c < 7.0 %.   - A1c trending down but continues to be above goal  - Unable to try SGLT-2i and GLP-1 agonists due to cost . She is already in the donut hole. She has been offered pt assistance in the past but per pt she tried for Xarelto and did not qualify  - Pt with multiple social determinants with husband and daughter having health concerns  - We reviewed rule of 15 for hypoglycemia and definition of hypoglycemia to prevent over- correction as that's what happened today  - She has been taking higher doses of insulin for supper , will reduce, we will also increase her insulin doses for the daytime, I have verbally reviewed the new insulin doses with her   MEDICATIONS:  -Change Humulin U-500 at 90 units before breakfast, 90 units before lunch and 80 units before supper - Continue Metformin 500 mg daily beakfast    EDUCATION / INSTRUCTIONS: BG monitoring instructions: Patient is instructed to check her blood sugars 3 times a day, before each meal Call [place] [facility] if: BG persistently < 70  I reviewed the Rule of 15 for the treatment of hypoglycemia in detail with the patient. Literature supplied.    2) Diabetic complications:  Eye: Does have known diabetic retinopathy. Per pt (mild) Neuro/ Feet: Does have known diabetic peripheral  neuropathy .  Renal: Patient does not have known baseline CKD. She   is not on an ACEI/ARB at present.   3) Dyslipidemia: Tolerating Atorvastatin 10 mg daily    -LDL has been acceptable at 89 mg/DL 04/2020.  LDL was 151 in 2020.   4) Morbid obesity , BMI 72:  - She was referred to the weight loss clinic in 2022 but she has been too busy with family members and unable to make it to them     F/U in 6 months    Signed electronically by: Mack Guise, MD  [facility]  [facility] [address] [place], [place] [address] Phone: [phone] FAX: [phone]   CC: Laurey Morale, [address] [address] Phone: [phone]  Fax: [phone]  Return to Endocrinology clinic as below: No future appointments.

## 2021-11-08 ENCOUNTER — Telehealth (INDEPENDENT_AMBULATORY_CARE_PROVIDER_SITE_OTHER): Payer: Medicare Other | Admitting: Internal Medicine

## 2021-11-08 ENCOUNTER — Encounter: Payer: Self-pay | Admitting: Internal Medicine

## 2021-11-08 VITALS — BP 136/82 | HR 78 | Ht 67.0 in | Wt >= 6400 oz

## 2021-11-08 DIAGNOSIS — Z794 Long term (current) use of insulin: Secondary | ICD-10-CM | POA: Diagnosis not present

## 2021-11-08 DIAGNOSIS — E1165 Type 2 diabetes mellitus with hyperglycemia: Secondary | ICD-10-CM | POA: Diagnosis not present

## 2021-11-08 DIAGNOSIS — E1142 Type 2 diabetes mellitus with diabetic polyneuropathy: Secondary | ICD-10-CM

## 2021-11-08 LAB — POCT GLYCOSYLATED HEMOGLOBIN (HGB A1C): Hemoglobin A1C: 8.8 % — AB (ref 4.0–5.6)

## 2021-11-08 MED ORDER — TIRZEPATIDE 2.5 MG/0.5ML ~~LOC~~ SOAJ: 2.5000 mg | SUBCUTANEOUS | 3 refills | Status: DC

## 2021-11-08 NOTE — Patient Instructions (Addendum)
-   Change U-500 90 units with Breakfast and 94 units with Lunch and 80 units with Supper  - Continue Metformin 500 mg, daily  - Start Mounjaro 2.5 mg once weekly     HOW TO TREAT LOW BLOOD SUGARS (Blood sugar LESS THAN 70 MG/DL) Please follow the RULE OF 15 for the treatment of hypoglycemia treatment (when your (blood sugars are less than 70 mg/dL)   STEP 1: Take 15 grams of carbohydrates when your blood sugar is low, which includes:  3-4 GLUCOSE TABS  OR 3-4 OZ OF JUICE OR REGULAR SODA OR ONE TUBE OF GLUCOSE GEL    STEP 2: RECHECK blood sugar in 15 MINUTES STEP 3: If your blood sugar is still low at the 15 minute recheck --> then, go back to STEP 1 and treat AGAIN with another 15 grams of carbohydrates.

## 2021-11-09 MED ORDER — HUMULIN R U-500 KWIKPEN 500 UNIT/ML ~~LOC~~ SOPN: PEN_INJECTOR | SUBCUTANEOUS | 11 refills | Status: DC

## 2021-11-09 MED ORDER — METFORMIN HCL 1000 MG PO TABS: 1000.0000 mg | ORAL_TABLET | Freq: Two times a day (BID) | ORAL | 3 refills | Status: DC

## 2021-11-11 ENCOUNTER — Other Ambulatory Visit: Payer: Self-pay | Admitting: Family Medicine

## 2021-11-13 ENCOUNTER — Other Ambulatory Visit: Payer: Self-pay | Admitting: Internal Medicine

## 2021-11-15 DIAGNOSIS — R768 Other specified abnormal immunological findings in serum: Secondary | ICD-10-CM | POA: Diagnosis not present

## 2021-11-15 DIAGNOSIS — M79641 Pain in right hand: Secondary | ICD-10-CM | POA: Diagnosis not present

## 2021-11-15 DIAGNOSIS — M2540 Effusion, unspecified joint: Secondary | ICD-10-CM | POA: Diagnosis not present

## 2021-11-15 DIAGNOSIS — M79642 Pain in left hand: Secondary | ICD-10-CM | POA: Diagnosis not present

## 2021-11-24 DIAGNOSIS — M7918 Myalgia, other site: Secondary | ICD-10-CM | POA: Diagnosis not present

## 2021-11-24 DIAGNOSIS — Z8739 Personal history of other diseases of the musculoskeletal system and connective tissue: Secondary | ICD-10-CM | POA: Diagnosis not present

## 2021-11-24 DIAGNOSIS — M961 Postlaminectomy syndrome, not elsewhere classified: Secondary | ICD-10-CM | POA: Diagnosis not present

## 2021-11-24 DIAGNOSIS — M5442 Lumbago with sciatica, left side: Secondary | ICD-10-CM | POA: Diagnosis not present

## 2021-11-24 DIAGNOSIS — Z79899 Other long term (current) drug therapy: Secondary | ICD-10-CM | POA: Diagnosis not present

## 2021-11-24 DIAGNOSIS — M5441 Lumbago with sciatica, right side: Secondary | ICD-10-CM | POA: Diagnosis not present

## 2021-11-24 DIAGNOSIS — G8929 Other chronic pain: Secondary | ICD-10-CM | POA: Diagnosis not present

## 2021-11-26 ENCOUNTER — Encounter: Payer: Self-pay | Admitting: Family Medicine

## 2021-11-27 ENCOUNTER — Other Ambulatory Visit: Payer: Self-pay

## 2021-11-27 ENCOUNTER — Telehealth: Payer: Self-pay | Admitting: Pharmacist

## 2021-11-27 DIAGNOSIS — N390 Urinary tract infection, site not specified: Secondary | ICD-10-CM

## 2021-11-27 MED ORDER — CIPROFLOXACIN HCL 500 MG PO TABS: 500.0000 mg | ORAL_TABLET | Freq: Two times a day (BID) | ORAL | 0 refills | Status: AC

## 2021-11-27 NOTE — Telephone Encounter (Signed)
Call in Cipro 500 mg BID for 7 days  

## 2021-11-27 NOTE — Chronic Care Management (AMB) (Signed)
Chronic Care Management Pharmacy Assistant   Name: Cheyenne Guzman BEGIN  MRN: [id number] DOB: [birth year]  Reason for Encounter: Disease State / Diabetes Assessment Call   Conditions to be addressed/monitored: DMII  Recent office visits:  09/04/2021 - [name] LPN - Medicare annual wellness exam   Recent consult visits:  11/08/2021 [name] MD (endo) - Patient was seen for Type 2 diabetes mellitus with hyperglycemia, with long-term current use of insulin and additional concerns. Started Tirzepatide 2.5 mg SQ. Changed Humulin R to Inject 90 Units into the skin daily before breakfast AND 94 Units daily before lunch AND 80 Units daily before supper. 90 units with breakfast, 90 units with lunch, and 80 units with supper. And Metformin 1000 mg twice daily.  Follow up in 4 months.   08/24/2021 [name] NP - Patient was seen for chronic continuous use of opioids and additional concerns. No medication changes. No follow up noted.   Hospital visits:  Patient was seen at [name] ED on 09/04/2021 (3 hours) due to shortness of breath, new left knee pain and swelling.   New?Medications Started at [facility] Discharge:?? No new medications Medication Changes at Hospital Discharge: No medication changes Medications Discontinued at Hospital Discharge: No discontinued medications Medications that remain the same after Hospital Discharge:??  -All other medications will remain the same.    Medications: Outpatient Encounter Medications as of 11/27/2021  Medication Sig Note   albuterol (PROVENTIL) (2.5 MG/3ML) 0.083% nebulizer solution 1 VIAL IN NEBULIZER EVERY 4 HOURS AS NEEDED FOR WHEEZING    albuterol (VENTOLIN HFA) 108 (90 Base) MCG/ACT inhaler Inhale 2 puffs into the lungs every 4 (four) hours as needed for wheezing or shortness of breath.    atorvastatin (LIPITOR) 10 MG tablet TAKE 1 TABLET BY MOUTH EVERY DAY    Blood Glucose Monitoring Suppl (CONTOUR NEXT MONITOR)  w/Device KIT 1 each by Does not apply route 2 (two) times daily. To check blood sugars twice a day.    Buprenorphine 15 MCG/HR PTWK Place 1 patch onto the skin every Monday. Uses 20 mcg at this time 09/19/2020: #4/28 days filled [facility] - current patch is on upper right arm   cefadroxil (DURICEF) 500 MG capsule Take 1 capsule (500 mg total) by mouth 2 (two) times daily. Take at first signs of recurrent cellulitis (Patient taking differently: Take 500 mg by mouth as needed. Take at first signs of recurrent cellulitis)    Cholecalciferol (VITAMIN D3) 50 MCG (2000 UT) TABS Take 2,000 Units by mouth every morning.    Continuous Blood Gluc Receiver (DEXCOM G7 RECEIVER) DEVI Use to check blood sugar. E11.65    Continuous Blood Gluc Sensor (DEXCOM G7 SENSOR) MISC 1 Device by Does not apply route as directed.    furosemide (LASIX) 40 MG tablet TAKE 1 TABLET BY MOUTH TWICE A DAY (Patient taking differently: Take 40 mg by mouth See admin instructions. Take one tablet (40 mg) by mouth daily when not going out - approximately 5 times week)    gabapentin (NEURONTIN) 100 MG capsule Take 100 mg by mouth 2 (two) times daily. Take with a 300 mg capsule for a total dose of 400 mg twice daily    gabapentin (NEURONTIN) 300 MG capsule Take 300 mg by mouth 2 (two) times daily. Take with a 100 mg capsule for a total dose of 400 mg twice daily    glucose blood (CONTOUR NEXT TEST) test strip Use to test blood sugar 2  times daily    glucose blood test strip Use as instructed    Insulin Pen Needle 31G X 8 MM MISC 1 Device by Does not apply route 3 (three) times daily.    insulin regular human CONCENTRATED (HUMULIN R U-500 KWIKPEN) 500 UNIT/ML KwikPen Inject 90 Units into the skin daily before breakfast AND 94 Units daily before lunch AND 80 Units daily before supper. 90 units with breakfast, 90 units with lunch, and 80 units with supper.    levothyroxine (SYNTHROID) 75 MCG tablet TAKE 1 TABLET BY MOUTH EVERY  DAY    lidocaine (XYLOCAINE) 5 % ointment Apply 1 application topically 2 (two) times daily as needed (pain).    metFORMIN (GLUCOPHAGE) 1000 MG tablet Take 1 tablet (1,000 mg total) by mouth 2 (two) times daily with a meal.    methocarbamol (ROBAXIN) 750 MG tablet Take 1 tablet (750 mg total) by mouth 2 (two) times daily. (Patient taking differently: Take 750 mg by mouth every morning.)    Multiple Vitamin (MULTIVITAMIN WITH MINERALS) TABS tablet Take 1 tablet by mouth daily.    Multiple Vitamins-Minerals (AIRBORNE GUMMIES) CHEW Chew 3 tablets by mouth every morning.    ondansetron (ZOFRAN) 4 MG tablet Take 1 tablet (4 mg total) by mouth as directed. Take one Zofran pill 30-60 minutes before each colonoscopy prep dose 10/04/2020: Before procedure    pramipexole (MIRAPEX) 0.5 MG tablet TAKE 1 TABLET BY MOUTH EVERYDAY AT BEDTIME    sertraline (ZOLOFT) 100 MG tablet TAKE 2 TABLETS BY MOUTH AT BEDTIME    tirzepatide (MOUNJARO) 2.5 MG/0.5ML Pen Inject 2.5 mg into the skin once a week.    XARELTO 20 MG TABS tablet TAKE 1 TABLET BY MOUTH EVERY DAY    No facility-administered encounter medications on file as of 11/27/2021.  Fill History:   Dispensed Days Supply Quantity Provider Pharmacy  MOUNJARO 2.5 MG/0.5 ML PEN 11/09/2021 90       Dispensed Days Supply Quantity Provider Pharmacy  SERTRALINE HCL 100 MG TABLET 11/13/2021 90       Dispensed Days Supply Quantity Provider Pharmacy  XARELTO 20 MG TABLET 11/11/2021 30       Dispensed Days Supply Quantity Provider Pharmacy  PRAMIPEXOLE 0.5 MG TABLET 11/13/2021 90       Dispensed Days Supply Quantity Provider Pharmacy  ONDANSETRON HCL 4 MG TABLET 09/29/2020 1       Dispensed Days Supply Quantity Provider Pharmacy  ALBUTEROL HFA 90 MCG INHALER 04/29/2021 25       Dispensed Days Supply Quantity Provider Pharmacy  ATORVASTATIN 10 MG TABLET 09/11/2021 90       Dispensed Days Supply Quantity Provider Pharmacy  BUPRENORPHINE 20 MCG/HR PATCH 11/05/2021  28       Dispensed Days Supply Quantity Provider Pharmacy  CEFADROXIL 500 MG CAPSULE 05/26/2021 30       Dispensed Days Supply Quantity Provider Pharmacy  GABAPENTIN  400 MG CAPS 11/24/2021 30       Dispensed Days Supply Quantity Provider Pharmacy  HUMULIN R 500 UNIT/ML [facility] 11/05/2021 23       Dispensed Days Supply Quantity Provider Pharmacy  LIDOCAINE 5% OINTMENT 10/09/2021 30       Dispensed Days Supply Quantity Provider Pharmacy  METFORMIN HCL 1,000 MG TABLET 11/09/2021 90       Dispensed Days Supply Quantity Provider Pharmacy  METHOCARBAMOL  750 MG TABS 11/24/2021 30      Recent Relevant Labs: Lab Results  Component Value Date/Time   HGBA1C 8.8 (A)  11/08/2021 11:25 AM   HGBA1C 9.6 (A) 05/11/2021 10:47 AM   HGBA1C 10.6 (H) 09/20/2020 07:24 AM   HGBA1C 9.2 (H) 02/19/2020 04:11 PM   MICROALBUR 15.7 (H) 11/05/2018 04:11 PM   MICROALBUR 1.5 04/14/2009 11:39 AM    Kidney Function Lab Results  Component Value Date/Time   CREATININE 1.15 (H) 09/04/2021 06:08 PM   CREATININE 0.92 09/23/2020 05:27 AM   CREATININE 0.92 02/19/2020 04:11 PM   CREATININE 0.93 02/01/2012 01:17 PM   GFR 56.49 (L) 11/05/2018 04:11 PM   GFRNONAA 56 (L) 09/04/2021 06:08 PM   GFRAA >60 04/09/2019 01:27 PM    Current antihyperglycemic regimen:  (HUMULIN R U-500 KWIKPEN) 500 UNIT/ML Inject 90 Units into the skin daily before breakfast AND 94 Units daily before lunch AND 80 Units daily before supper. 90 units with breakfast, 90 units with lunch, and 80 units with supper. Metformin 1000 mg twice daily Mounjaro 2.5 mg/0.5 inject 2.5 mg weekly  What recent interventions/DTPs have been made to improve glycemic control:   Started Tirzepatide 2.5 mg SQ. Changed Humulin R to Inject 90 Units into the skin daily before breakfast AND 94 Units daily before lunch AND 80 Units daily before supper. 90 units with breakfast, 90 units with lunch, and 80 units with supper. And Metformin 1000 mg twice daily.  Have  there been any recent hospitalizations or ED visits since last visit with CPP? Patient was seen at [facility] ED on 09/04/2021 (3 hours) due to shortness of breath, new left knee pain and swelling.  Patient denies hypoglycemic symptoms, including None  Patient denies hyperglycemic symptoms, including none  How often are you checking your blood sugar? Multiple times daily. Patient states she is noticing her blood sugars are starting to regulate with recent medication changes. She also mentions the dexcom is helping her to understand what foods cause her blood sugars to spike and what things like stress are doing to her blood sugars. She states it is a blessing to her.   What are your blood sugars ranging? Dexcom averages 30 day average - 208 14 day average - 191 7 day average - 188 2 day average - 176 Last 7 days low was 105 and high was 220  During the week, how often does your blood glucose drop below 70? Patient denies any blood sugars below 70  Are you checking your feet daily/regularly? Patient is checking her feet daily.   Adherence Review: Is the patient currently on a STATIN medication? Yes Is the patient currently on ACE/ARB medication? No Does the patient have >5 day gap between last estimated fill dates? No  Care Gaps: AWV - completed 09/04/2021 Last BP - 128/80 on 08/10/2021 Last A1C - 8.8 on 11/08/2021 Eye exam - never done Hep C Screen - never done Tdap - never done Shingrix - never done Papsmear - never done Urince ACR - overdue Covid - overdue Flu - due  Star Rating Drugs: Atorvastatin 10 mg - last filled [facility]/11/2021 [facility] Metformin 1000 mg - last filled 11/09/2021 90 DS at [facility] [phone]

## 2021-11-28 DIAGNOSIS — M25561 Pain in right knee: Secondary | ICD-10-CM | POA: Diagnosis not present

## 2021-11-28 DIAGNOSIS — M79641 Pain in right hand: Secondary | ICD-10-CM | POA: Diagnosis not present

## 2021-11-28 DIAGNOSIS — M79642 Pain in left hand: Secondary | ICD-10-CM | POA: Diagnosis not present

## 2021-11-28 DIAGNOSIS — M2540 Effusion, unspecified joint: Secondary | ICD-10-CM | POA: Diagnosis not present

## 2021-11-28 DIAGNOSIS — M059 Rheumatoid arthritis with rheumatoid factor, unspecified: Secondary | ICD-10-CM | POA: Diagnosis not present

## 2021-12-05 ENCOUNTER — Encounter: Payer: Self-pay | Admitting: Family Medicine

## 2021-12-05 NOTE — Telephone Encounter (Signed)
Yes I could help her with this. Have her set up an OV to do it together. Also have her bring a copy of an old form that Dr. Patrice Paradise had filled out for [place] to go by

## 2021-12-08 ENCOUNTER — Encounter: Payer: Self-pay | Admitting: Family Medicine

## 2021-12-08 ENCOUNTER — Ambulatory Visit (INDEPENDENT_AMBULATORY_CARE_PROVIDER_SITE_OTHER): Payer: Medicare Other | Admitting: Family Medicine

## 2021-12-08 VITALS — BP 128/76 | HR 83 | Temp 98.6°F | Wt >= 6400 oz

## 2021-12-08 DIAGNOSIS — M5441 Lumbago with sciatica, right side: Secondary | ICD-10-CM | POA: Diagnosis not present

## 2021-12-08 DIAGNOSIS — G8929 Other chronic pain: Secondary | ICD-10-CM | POA: Diagnosis not present

## 2021-12-08 DIAGNOSIS — M5442 Lumbago with sciatica, left side: Secondary | ICD-10-CM

## 2021-12-08 DIAGNOSIS — M059 Rheumatoid arthritis with rheumatoid factor, unspecified: Secondary | ICD-10-CM | POA: Diagnosis not present

## 2021-12-08 DIAGNOSIS — Z23 Encounter for immunization: Secondary | ICD-10-CM

## 2021-12-08 NOTE — Progress Notes (Signed)
   Subjective:    Patient ID: Cheyenne Guzman, female    DOB: 07-24-1965, 56 y.o.   MRN: [id number]  HPI Here to fill out disability paperwork. She had her original lumbar fusion surgery on 03-29-14, and she has been disabled ever since. She is also cared for by her spinal surgeon, Dr. Rennis Harding, and by her rheumatologist, Dr. Christinia Gully. She has chronic pain in the spine and in all major joints    Review of Systems  Constitutional: Negative.   Respiratory: Negative.    Cardiovascular: Negative.   Musculoskeletal:  Positive for arthralgias and back pain.       Objective:   Physical Exam Constitutional:      Comments: In a wheelchair   Cardiovascular:     Rate and Rhythm: Normal rate and regular rhythm.     Pulses: Normal pulses.     Heart sounds: Normal heart sounds.  Pulmonary:     Effort: Pulmonary effort is normal.     Breath sounds: Normal breath sounds.  Neurological:     Mental Status: She is alert.           Assessment & Plan:  She is permanently and totally disabled from chronic low back pain and RA. All paperwork was filled out.  [name], MD

## 2021-12-08 NOTE — Addendum Note (Signed)
Addended by: Wyvonne Lenz on: 12/08/2021 11:39 AM   Modules accepted: Orders

## 2021-12-17 ENCOUNTER — Other Ambulatory Visit: Payer: Self-pay | Admitting: Internal Medicine

## 2021-12-17 DIAGNOSIS — E785 Hyperlipidemia, unspecified: Secondary | ICD-10-CM

## 2022-01-05 ENCOUNTER — Other Ambulatory Visit (HOSPITAL_COMMUNITY): Payer: Self-pay

## 2022-01-21 ENCOUNTER — Other Ambulatory Visit: Payer: Self-pay | Admitting: Family Medicine

## 2022-01-26 ENCOUNTER — Other Ambulatory Visit (HOSPITAL_COMMUNITY): Payer: Self-pay

## 2022-02-09 ENCOUNTER — Other Ambulatory Visit: Payer: Self-pay

## 2022-02-09 ENCOUNTER — Other Ambulatory Visit: Payer: Self-pay | Admitting: Family Medicine

## 2022-02-09 MED ORDER — LEVOTHYROXINE SODIUM 75 MCG PO TABS: 75.0000 ug | ORAL_TABLET | Freq: Every day | ORAL | 0 refills | Status: DC

## 2022-02-09 MED ORDER — SERTRALINE HCL 100 MG PO TABS: 200.0000 mg | ORAL_TABLET | Freq: Every day | ORAL | 0 refills | Status: DC

## 2022-02-09 MED ORDER — PRAMIPEXOLE DIHYDROCHLORIDE 0.5 MG PO TABS: ORAL_TABLET | ORAL | 0 refills | Status: DC

## 2022-02-19 ENCOUNTER — Encounter: Payer: Self-pay | Admitting: Family Medicine

## 2022-02-19 ENCOUNTER — Ambulatory Visit (INDEPENDENT_AMBULATORY_CARE_PROVIDER_SITE_OTHER): Payer: Medicare Other | Admitting: Family Medicine

## 2022-02-19 VITALS — BP 130/76 | HR 85 | Temp 98.1°F | Wt >= 6400 oz

## 2022-02-19 DIAGNOSIS — H6992 Unspecified Eustachian tube disorder, left ear: Secondary | ICD-10-CM

## 2022-02-19 DIAGNOSIS — H9192 Unspecified hearing loss, left ear: Secondary | ICD-10-CM | POA: Diagnosis not present

## 2022-02-19 NOTE — Progress Notes (Signed)
   Established Patient Office Visit   Subjective  Patient ID: Cheyenne Guzman, female    DOB: 09-17-1965  Age: 57 y.o. MRN: [id number]  Chief Complaint  Patient presents with   Ear Fullness    Pt reports she felt pressure on her left side of head on Thursday. Left ear feels full and decrease in hearing on same ear.   Patient accompanied by her daughter.  Patient is a 57 year old female followed by Dr. Sarajane Jews who is seen for acute concern.  Patient endorses feeling pressure/buzzing sensation in left ear.  Notes decreased hearing in left ear.  Tried Sudafed without relief.  Patient denies allergy/sinus issues, ear pain, sore throat, rhinorrhea.  Endorses prior history of tinnitus.  Sounds like a deflating air hose in her ears.  Ear Fullness  There is pain in the left ear. This is a new problem. The current episode started in the past 7 days. The problem occurs constantly. The problem has been unchanged. There has been no fever. The patient is experiencing no pain.      ROS Negative unless stated above    Objective:     BP 130/76 (BP Location: Right Wrist, Cuff Size: Normal)   Pulse 85   Temp 98.1 F (36.7 C) (Oral)   Wt (!) 432 lb (196 kg)   SpO2 93%   BMI 67.66 kg/m    Physical Exam Constitutional:      Appearance: Normal appearance. She is morbidly obese.  HENT:     Head: Normocephalic and atraumatic.     Right Ear: Tympanic membrane, ear canal and external ear normal. There is no impacted cerumen.     Left Ear: Ear canal and external ear normal. Decreased hearing noted. A middle ear effusion is present. There is no impacted cerumen.     Ears:     Comments: Left TM full without erythema, suppurative fluid, air-fluid level.    Nose: Nose normal.     Mouth/Throat:     Mouth: Mucous membranes are moist.  Eyes:     Extraocular Movements: Extraocular movements intact.     Conjunctiva/sclera: Conjunctivae normal.     Pupils: Pupils are equal, round, and reactive to light.   Pulmonary:     Effort: Pulmonary effort is normal.  Skin:    General: Skin is warm and dry.  Neurological:     Mental Status: She is alert.     Comments: Gait not assessed as sitting in transport wheelchair.      No results found for any visits on 02/19/22.    Assessment & Plan:  Eustachian tube dysfunction, left  Decreased hearing of left ear  Advised decreased hearing and pressure/plugging sensation likely 2/2 eustachian tube dysfunction.  Start OTC antihistamine for left eustachian tube dysfunction.  Also consider Flonase nasal spray or saline nasal rinse.  No current signs of AOM.  Continue to monitor.  Return if symptoms worsen or fail to improve.   Billie Ruddy, MD

## 2022-02-27 DIAGNOSIS — Z79899 Other long term (current) drug therapy: Secondary | ICD-10-CM | POA: Diagnosis not present

## 2022-02-27 DIAGNOSIS — Z8739 Personal history of other diseases of the musculoskeletal system and connective tissue: Secondary | ICD-10-CM | POA: Diagnosis not present

## 2022-02-27 DIAGNOSIS — M7918 Myalgia, other site: Secondary | ICD-10-CM | POA: Diagnosis not present

## 2022-02-28 DIAGNOSIS — M2560 Stiffness of unspecified joint, not elsewhere classified: Secondary | ICD-10-CM | POA: Diagnosis not present

## 2022-02-28 DIAGNOSIS — M79641 Pain in right hand: Secondary | ICD-10-CM | POA: Diagnosis not present

## 2022-02-28 DIAGNOSIS — M79642 Pain in left hand: Secondary | ICD-10-CM | POA: Diagnosis not present

## 2022-02-28 DIAGNOSIS — M059 Rheumatoid arthritis with rheumatoid factor, unspecified: Secondary | ICD-10-CM | POA: Diagnosis not present

## 2022-03-06 NOTE — Progress Notes (Signed)
Care Management & Coordination Services Pharmacy Note  03/06/2022 Name:  Cheyenne Guzman MRN:  [id number] DOB:  [birth year]  Summary: -Dexcom showing current A1C of 8.0 based on last two weeks of sugar readings, has appt with endo on 2/23, expecing a mounjaro dose increase -Due for updated lipid panel at next OV (last in 2022)  Recommendations/Changes made from today's visit: -Continue medication therapy -Established link to patient's CGM dexcom device for easy glucose monitoring in office -Recommend updated lipid panel at next OV  Follow up plan: DM Review call in 2 months Pharmacist call in 4 months   Subjective: Cheyenne Guzman is an 57 y.o. year old female who is a primary patient of Laurey Morale, MD.  The care coordination team was consulted for assistance with disease management and care coordination needs.    Engaged with patient by telephone for follow up visit.  Recent office visits: 02/19/22 Billie Ruddy, MD - Left ear hearing defect, no medication changes  12/08/21 Laurey Morale, MD - For RA, no medication changes. Disability paperwork filled out for patient.  Recent consult visits: 02/28/22 Catalina Antigua, MD (Rheumatology) - For RA follow-up, no med changes  02/27/22 Lindaann Slough, MD (Pain Mgmt) - START methocarbamol 7105m TID, Butrans 232m/hr patch every 7 days  11/28/21 AlChristinia GullyMD (Rheumatology) - For follow-up of joint pain. START Plaquenil 20014m tabs daily  11/08/2021 Ibtehal Shamleffer MD (endo) - Patient was seen for Type 2 diabetes mellitus with hyperglycemia, with long-term current use of insulin and additional concerns. Started Tirzepatide 2.5 mg SQ. Changed Humulin R to Inject 90 Units into the skin daily before breakfast AND 94 Units daily before lunch AND 80 Units daily before supper. 90 units with breakfast, 90 units with lunch, and 80 units with supper. And Metformin 1000 mg twice daily.  Follow up in 4 months.   Hospital  visits: None   Objective:  Lab Results  Component Value Date   CREATININE 1.15 (H) 09/04/2021   BUN 16 09/04/2021   GFR 56.49 (L) 11/05/2018   GFRNONAA 56 (L) 09/04/2021   GFRAA >60 04/09/2019   NA 138 09/04/2021   K 4.3 09/04/2021   CALCIUM 9.1 09/04/2021   CO2 28 09/04/2021   GLUCOSE 255 (H) 09/04/2021    Lab Results  Component Value Date/Time   HGBA1C 8.8 (A) 11/08/2021 11:25 AM   HGBA1C 9.6 (A) 05/11/2021 10:47 AM   HGBA1C 10.6 (H) 09/20/2020 07:24 AM   HGBA1C 9.2 (H) 02/19/2020 04:11 PM   FRUCTOSAMINE 327 (H) 06/21/2015 10:40 AM   GFR 56.49 (L) 11/05/2018 04:11 PM   GFR 72.54 05/22/2017 02:22 PM   MICROALBUR 15.7 (H) 11/05/2018 04:11 PM   MICROALBUR 1.5 04/14/2009 11:39 AM    Last diabetic Eye exam: No results found for: "HMDIABEYEEXA"  Last diabetic Foot exam: No results found for: "HMDIABFOOTEX"   Lab Results  Component Value Date   CHOL 162 05/19/2020   HDL 50.70 05/19/2020   LDLCALC 89 05/19/2020   LDLDIRECT 151.0 11/05/2018   TRIG 108.0 05/19/2020   CHOLHDL 3 05/19/2020       Latest Ref Rng & Units 09/19/2020    2:38 AM 09/16/2020   11:09 AM 02/19/2020    4:11 PM  Hepatic Function  Total Protein 6.5 - 8.1 g/dL 7.3  6.9  7.2   Albumin 3.5 - 5.0 g/dL 4.1  3.7    AST 15 - 41 U/L 22  18  20   $ ALT  0 - 44 U/L 15  16  16   $ Alk Phosphatase 38 - 126 U/L 80  84    Total Bilirubin 0.3 - 1.2 mg/dL 0.7  0.4  0.5   Bilirubin, Direct 0.0 - 0.2 mg/dL   0.1     Lab Results  Component Value Date/Time   TSH 3.45 02/19/2020 04:11 PM   TSH 2.57 11/05/2018 04:11 PM   FREET4 1.2 02/19/2020 04:11 PM   FREET4 0.91 11/05/2018 04:11 PM       Latest Ref Rng & Units 09/04/2021    6:08 PM 09/23/2020    7:35 AM 09/22/2020    5:20 AM  CBC  WBC 4.0 - 10.5 K/uL 4.8  7.8  7.0   Hemoglobin 12.0 - 15.0 g/dL 13.2  11.9  10.4   Hematocrit 36.0 - 46.0 % 42.9  40.1  35.0   Platelets 150 - 400 K/uL 161  211  173     Lab Results  Component Value Date/Time   VITAMINB12 673  06/25/2013 10:37 AM    Clinical ASCVD: Yes The 10-year ASCVD risk score (Arnett DK, et al., 2019) is: 6.1%   Values used to calculate the score:     Age: 57 years     Sex: Female     Is Non-Hispanic African American: No     Diabetic: Yes     Tobacco smoker: No     Systolic Blood Pressure: 0000000 mmHg     Is BP treated: No     HDL Cholesterol: 50.7 mg/dL     Total Cholesterol: 162 mg/dL        02/19/2022    3:15 PM 09/04/2021    9:40 AM 08/10/2021   10:30 AM  Depression screen PHQ 2/9  Decreased Interest 2 0 0  Down, Depressed, Hopeless 2 0 0  PHQ - 2 Score 4 0 0  Altered sleeping 2  0  Tired, decreased energy 2  0  Change in appetite 1  0  Feeling bad or failure about yourself  1  0  Trouble concentrating 0  0  Moving slowly or fidgety/restless 0  0  Suicidal thoughts 0  0  PHQ-9 Score 10  0  Difficult doing work/chores Somewhat difficult  Not difficult at all     Social History   Tobacco Use  Smoking Status Former   Types: Cigarettes   Quit date: 02/19/1992   Years since quitting: 30.0  Smokeless Tobacco Never   BP Readings from Last 3 Encounters:  02/19/22 130/76  12/08/21 128/76  11/08/21 136/82   Pulse Readings from Last 3 Encounters:  02/19/22 85  12/08/21 83  11/08/21 78   Wt Readings from Last 3 Encounters:  02/19/22 (!) 432 lb (196 kg)  12/08/21 (!) 438 lb (198.7 kg)  11/08/21 (!) 446 lb (202.3 kg)   BMI Readings from Last 3 Encounters:  02/19/22 67.66 kg/m  12/08/21 68.60 kg/m  11/08/21 69.85 kg/m    Allergies  Allergen Reactions   Vicodin [Hydrocodone-Acetaminophen] Nausea And Vomiting    Medications Reviewed Today     Reviewed by Billie Ruddy, MD (Physician) on 02/19/22 at 1506  Med List Status: <None>   Medication Order Taking? Sig Documenting Provider Last Dose Status Informant  albuterol (PROVENTIL) (2.5 MG/3ML) 0.083% nebulizer solution [id number] No 1 VIAL IN NEBULIZER EVERY 4 HOURS AS NEEDED FOR WHEEZING Laurey Morale, MD  Taking Active Self  albuterol (VENTOLIN HFA) 108 (90 Base) MCG/ACT inhaler [id number] No Inhale  2 puffs into the lungs every 4 (four) hours as needed for wheezing or shortness of breath. Laurey Morale, MD Taking Active   atorvastatin (LIPITOR) 10 MG tablet [id number]  TAKE 1 TABLET BY MOUTH EVERY DAY Shamleffer, Melanie Crazier, MD  Active   Blood Glucose Monitoring Suppl (CONTOUR NEXT MONITOR) w/Device KIT [id number] No 1 each by Does not apply route 2 (two) times daily. To check blood sugars twice a day. Renato Shin, MD Taking Active Self  Buprenorphine 15 MCG/HR [place] [id number] No Place 1 patch onto the skin every Monday. Uses 20 mcg at this time [provider] Taking Active Self           Med Note Orvan Seen, Tally Joe Sep 19, 2020  3:55 PM) #4/28 days [facility] - current patch is on upper right arm  cefadroxil (DURICEF) 500 MG capsule [id number] No Take 1 capsule (500 mg total) by mouth 2 (two) times daily. Take at first signs of recurrent cellulitis  Patient taking differently: Take 500 mg by mouth as needed. Take at first signs of recurrent cellulitis   Tommy Medal, Lavell Islam, MD Taking Active   Cholecalciferol (VITAMIN D3) 50 MCG (2000 UT) TABS [id number] No Take 2,000 Units by mouth every morning. [provider] Taking Active Self  Continuous Blood Gluc Receiver ([place]) [place] [id number] No Use to check blood sugar. E11.65 Shamleffer, Melanie Crazier, MD Taking Active   Continuous Blood Gluc Sensor (DEXCOM G7 SENSOR) MISC [id number] No 1 Device by Does not apply route as directed. Shamleffer, Melanie Crazier, MD Taking Active   furosemide (LASIX) 40 MG tablet [id number] No TAKE 1 TABLET BY MOUTH TWICE A DAY  Patient taking differently: Take 40 mg by mouth See admin instructions. Take one tablet (40 mg) by mouth daily when not going out - approximately 5 times week   Laurey Morale, MD Taking Active   gabapentin (NEURONTIN) 100 MG capsule  [id number] No Take 100 mg by mouth 2 (two) times daily. Take with a 300 mg capsule for a total dose of 400 mg twice daily [provider] Taking Active Self  gabapentin (NEURONTIN) 300 MG capsule [id number] No Take 300 mg by mouth 2 (two) times daily. Take with a 100 mg capsule for a total dose of 400 mg twice daily [provider] Taking Active Self  glucose blood (CONTOUR NEXT TEST) test strip [id number] No Use to test blood sugar 2 times daily Renato Shin, MD Taking Active Self  glucose blood test strip [id number] No Use as instructed Renato Shin, MD Taking Active Self  hydroxychloroquine (PLAQUENIL) 200 MG tablet [id number] No Take 200 mg by mouth. [provider] Taking Active   Insulin Pen Needle 31G X 8 MM MISC [id number] No 1 Device by Does not apply route 3 (three) times daily. Shamleffer, Melanie Crazier, MD Taking Active   insulin regular human CONCENTRATED (HUMULIN R U-500 KWIKPEN) 500 UNIT/ML KwikPen [id number] No Inject 90 Units into the skin daily before breakfast AND 94 Units daily before lunch AND 80 Units daily before supper. 90 units with breakfast, 90 units with lunch, and 80 units with supper. Shamleffer, Melanie Crazier, MD Taking Active   levothyroxine (SYNTHROID) 75 MCG tablet [id number]  Take 1 tablet (75 mcg total) by mouth daily. Laurey Morale, MD  Active   lidocaine (XYLOCAINE) 5 % ointment [id number] No Apply 1 application topically 2 (two) times daily as needed (pain). [provider] Taking  Active Self           Med Note Orvan Seen, HEATHER L   Mon Sep 19, 2020  3:58 PM)    metFORMIN (GLUCOPHAGE) 1000 MG tablet [id number] No Take 1 tablet (1,000 mg total) by mouth 2 (two) times daily with a meal. Shamleffer, Melanie Crazier, MD Taking Active   methocarbamol (ROBAXIN) 750 MG tablet [id number] No Take 1 tablet (750 mg total) by mouth 2 (two) times daily.  Patient taking differently: Take 750 mg by mouth every morning.   Laurey Morale, MD Taking  Active   Multiple Vitamin (MULTIVITAMIN WITH MINERALS) TABS tablet [id number] No Take 1 tablet by mouth daily. [provider] Taking Active Self  Multiple Vitamins-Minerals (AIRBORNE GUMMIES) CHEW [id number] No Chew 3 tablets by mouth every morning. [provider] Taking Active Self  ondansetron (ZOFRAN) 4 MG tablet [id number] No Take 1 tablet (4 mg total) by mouth as directed. Take one Zofran pill 30-60 minutes before each colonoscopy prep dose Armbruster, Carlota Raspberry, MD Taking Active Self           Med Note Wilmon Pali, MELISSA R   Tue Oct 04, 2020  1:33 PM) Before procedure   pramipexole (MIRAPEX) 0.5 MG tablet [id number]  TAKE 1 TABLET BY MOUTH EVERYDAY AT BEDTIME Laurey Morale, MD  Active   sertraline (ZOLOFT) 100 MG tablet [id number]  Take 2 tablets (200 mg total) by mouth at bedtime. Laurey Morale, MD  Active   tirzepatide [facility]) 2.5 MG/0.5ML Pen [id number] No Inject 2.5 mg into the skin once a week. Shamleffer, Melanie Crazier, MD Taking Active   XARELTO 20 [place] TABS tablet [id number]  TAKE 1 TABLET BY MOUTH EVERY DAY Laurey Morale, MD  Active             SDOH:  (Social Determinants of Health) assessments and interventions performed: Yes SDOH Interventions    Flowsheet Row Chronic Care Management from 05/17/2020 in [place] at [place] from 05/03/2020 in [place] Telephone from 04/08/2020 in [place] from 02/12/2020 in [place] at [place] Interventions -- -- -- Other (Comment)  [Community Resource referral]  Housing Interventions -- -- -- Intervention Not Indicated  Transportation Interventions Intervention Not Indicated -- Other (Comment), [id number] Referral  [referral to [facility] for Housing and [employer] Studies,  Extra Help application submitted.] Intervention Not Indicated  Depression Interventions/Treatment  -- -- --  Patient refuses Treatment  Financial Strain Interventions Other (Comment)  [working on patient assistance] Other (Comment)  [Xarelto PAP,  f/u on Extra Help program] Other (Comment)  [referral to [employer] for Housing and [employer],  Extra Help application submitted.] Intervention Not Indicated  Physical Activity Interventions -- -- -- Intervention Not Indicated  Stress Interventions -- -- -- Intervention Not Indicated  Social Connections Interventions -- -- -- Intervention Not Indicated       Medication Assistance: None required.  Patient affirms current coverage meets needs.  Medication Access: Within the past 30 days, how often has patient missed a dose of medication? None Is a pillbox or other method used to improve adherence? Yes  Factors that may affect medication adherence? adverse effects of medications Are meds synced by current pharmacy? No  Are meds delivered by current pharmacy? Yes  Does patient experience delays in picking up medications due to transportation concerns? No   [facility] Services Reviewed: Is patient disadvantaged to  use [facility]?: No  Current Rx insurance plan: [id number] [place] [address]Phone: [phone]: [phone] [address]: [phone]: [phone] [facility] services reviewed with patient today?: No  Patient requests to transfer care to [facility]?: No  Reason patient declined to change pharmacies: Disadvantaged due to insurance/mail order  Compliance/Adherence/Medication fill history: Care Gaps: Diabetic eye exam/foot exam Vaccines: Shingles, Tdap Urine microalbumin  Star-Rating Drugs: Atorvastatin 177mPDC 97% Metformin 100069mDC 100%   Assessment/Plan  Hyperlipidemia: (LDL goal <  70) -Uncontrolled -Current treatment: Atorvastatin 18m84m Appropriate, Effective, Safe, Accessible -Medications previously tried: Simvastatin  -Current dietary patterns: not discussed -Current exercise habits: exercises daily with a foot pedal -Educated on Cholesterol goals;  Importance of limiting foods high in cholesterol; Reminded patient she is due for updated lipid panel -Recommended to continue current medication Updated lipid panel at next OV  Diabetes (A1c goal <7%) -Uncontrolled -Current medications: Humulin R U-500 90 units at breakfast, 94 at lunch, and 80 units with supper Appropriate, Effective, Safe, Accessible Metformin 1000mg58m Appropriate, Effective, Safe, Accessible Mounjaro 2.5mg o25m weekly Appropriate, Query Effective -Medications previously tried: Januvia  -Current home glucose readings Uses Dexcom CGM Device, access granted to office today, showing A1C 8.0 off last 2 weeks of readings currently -Denies hypoglycemic/hyperglycemic symptoms -Current meal patterns:  Not discussed in detail but cannot do sugar free replacements as they trigger her migraines -Current exercise: see above -Educated on A1c and blood sugar goals; Complications of diabetes including kidney damage, retinal damage, and cardiovascular disease; Exercise goal of 150 minutes per week; Continuous glucose monitoring; -Counseled to check feet daily and get yearly eye exams -Recommended increasing Mounjaro to 5mg on22mweekly if endorsed by endo at appt on 2/23. Discussed potential side effects with dose increase.  Natahlia Hoggard Collyercist [phone]

## 2022-03-12 ENCOUNTER — Telehealth: Payer: Self-pay

## 2022-03-12 NOTE — Progress Notes (Signed)
Care Management & Coordination Services Pharmacy Team  Reason for Encounter: Appointment Reminder  Attempted to contact patient to confirm telephone appointment with Burman Riis, PharmD on 03/13/2022 at 10:30. Unsuccessful outreach. Left voicemail for patient to return call.  Do you have any problems getting your medications?  If yes what types of problems are you experiencing?   What is your top health concern you would like to discuss at your upcoming visit?   Have you seen any other providers since your last visit with PCP?   Care Gaps: AWV - completed 09/04/2021 Last eye exam / retinopathy screening: never done Last diabetic foot exam: 10/16/2017 Hep C Screen - never done Tdap - never done Singrix - never done Pap smear - never done Urine ACR - overdue Covid - overdue  Star Rating Drugs: Atorvastatin 10 mg - last filled [facility] Metformin 1000 mg - last filled 02/13/2022 90 DS at [place] Pharmacist Assistant [phone]

## 2022-03-13 ENCOUNTER — Ambulatory Visit: Payer: Medicare Other

## 2022-03-16 ENCOUNTER — Encounter: Payer: Self-pay | Admitting: Internal Medicine

## 2022-03-16 ENCOUNTER — Ambulatory Visit: Payer: Medicare Other | Admitting: Internal Medicine

## 2022-03-16 VITALS — BP 120/78 | HR 78 | Ht 67.0 in | Wt >= 6400 oz

## 2022-03-16 DIAGNOSIS — E1165 Type 2 diabetes mellitus with hyperglycemia: Secondary | ICD-10-CM | POA: Diagnosis not present

## 2022-03-16 DIAGNOSIS — Z794 Long term (current) use of insulin: Secondary | ICD-10-CM

## 2022-03-16 LAB — POCT GLYCOSYLATED HEMOGLOBIN (HGB A1C): Hemoglobin A1C: 7.2 % — AB (ref 4.0–5.6)

## 2022-03-16 MED ORDER — TIRZEPATIDE 5 MG/0.5ML ~~LOC~~ SOAJ: 5.0000 mg | SUBCUTANEOUS | 3 refills | Status: DC

## 2022-03-16 MED ORDER — METFORMIN HCL 1000 MG PO TABS: 1000.0000 mg | ORAL_TABLET | Freq: Every day | ORAL | 3 refills | Status: DC

## 2022-03-16 MED ORDER — HUMULIN R U-500 KWIKPEN 500 UNIT/ML ~~LOC~~ SOPN: PEN_INJECTOR | SUBCUTANEOUS | 11 refills | Status: DC

## 2022-03-16 NOTE — Progress Notes (Signed)
Name: Cheyenne Guzman  Age/ Sex: 57 y.o., female   MRN/ DOB: [id number], December 04, 1965     PCP: Laurey Morale, MD   Reason for Endocrinology Evaluation: Type 2 Diabetes Mellitus  Initial Endocrine Consultative Visit: 08/06/2016    PATIENT IDENTIFIER: [name] is a 57 y.o. female with a past medical history of T2DM, HTN, OSA. The patient has followed with Endocrinology clinic since 08/06/2016 for consultative assistance with management of her diabetes.  DIABETIC HISTORY:  Cheyenne Guzman was diagnosed with T2DM in 2000. Has been on Metformin without side effects  .Has been on insulin since 2009. Her hemoglobin A1c has ranged from 8.2% in 2017, peaking at 10.3% in 2019.  Guzman with me 08/2018. Stopped lantus and started Novolog mix as well as metformin.   Attempted to prescribe Ozempic in 2021 but it was cost prohibitive  By September 2022 we will switch Humalog mix to Humulin U-500   Started Mounjaro 10/2021 SUBJECTIVE:      Today (03/16/2022): Cheyenne Guzman is here for a follow up on diabetes management . She is accompanied by her daughter Cheyenne Guzman. She checks her blood sugars multiple  daily . She  is symptomatic with these visit   Denies nausea or diarrhea  Has constipation due to pain meds, on stool softners   Ha been taking with 70 units with breakfast , 40 units with lunch and Supper  HOME REGIMEN:  Metformin 1000 mg, with breakfast  Mounjaro 2.5 mg weekly ( Saturday )  Humulin U-500 , 70 units with breakfast, 40 units with lunch, and 40 units with supper Lipitor 10 mg daily    Statin: yes ACE-I/ARB: no Prior Diabetic Education: Yes   CONTINUOUS GLUCOSE MONITORING RECORD INTERPRETATION    Dates of Recording: 2/10 - 03/16/2022  Sensor description: Dexcom  Results statistics:   CGM use % of time 79  Average and SD 198/37  Time in range     36   %  % Time Above 180 56  % Time above 250 8  % Time Below target 0   Glycemic patterns summary: BG's are  stable overnight and trend up during the day  Hyperglycemic episodes postprandial  Hypoglycemic episodes occurred N/A in the past 2 weeks  Overnight periods: Variable   DIABETIC COMPLICATIONS: Microvascular complications:  Neuropathy, ? Mild retinopathy  Denies: CKD Last Eye Exam: Completed 11/2020  Macrovascular complications:  TIA Denies: CAD, CVA, PVD   HISTORY:  Past Medical History:  Past Medical History:  Diagnosis Date   Anxiety    per pt. - mild    Arthritis    back & ankles    Asthma    seasonal    Breast cancer (HCC)    Burning sensation of mouth 10/07/2020   Cancer ([place])    breast, sees Dr. Jana Hakim , Left - 11/2007   Candida vaginitis 10/07/2020   Cellulitis 2020   severe - Left leg    Complication of anesthesia 2013   severe muscle spasms-sore-no doc   Depression    Diabetes mellitus    sees Dr. Chalmers Cater, diagnosed 2003   HA (headache)    Hearing loss    Went to [facility] and [facility]ro[facility],Dr Laing,Guzman ears   Hyperlipidemia    Kidney infection    - hosp. [facility]- septic- 10/2009   Low back pain    Muscle tear 10/07/2020   Peripheral neuropathy    R sided numbness- face & jaw   Pulmonary embolism (  [place]) 2020   Sepsis ([place]) 2020   Sleep apnea 09/2013   severe-just started cpap-doing well   Sore throat 10/07/2020   TIA (transient ischemic attack)    in 2007,2008, and 2011   Tinnitus of right ear    Urosepsis 08/2009   with Klebsiella   Past Surgical History:  Past Surgical History:  Procedure Laterality Date   ABDOMINAL HYSTERECTOMY  08/2004   BREAST BIOPSY  06/22/2011   Procedure: BREAST BIOPSY;  Surgeon: Stark Klein, MD;  Location: [facility];  Service: General;  Laterality: Right;   BREAST LUMPECTOMY     BREAST SURGERY  11/2007   left ductal carcinoma in situ   CARPAL TUNNEL RELEASE Right 10/13/2013   Procedure: RIGHT CARPAL TUNNEL RELEASE;  Surgeon: Hessie Dibble, MD;  Location: [facility];  Service: Orthopedics;   Laterality: Right;   CARPAL TUNNEL RELEASE Left 11/24/2013   Procedure: LEFT CARPAL TUNNEL RELEASE WITH CYST EXCISION ;  Surgeon: Hessie Dibble, MD;  Location: [facility];  Service: Orthopedics;  Laterality: Left;   COLONOSCOPY WITH PROPOFOL N/A 10/10/2020   Procedure: COLONOSCOPY WITH PROPOFOL;  Surgeon: Yetta Flock, MD;  Location: [facility];  Service: Gastroenterology;  Laterality: N/A;   EAR CYST EXCISION Left 11/24/2013   Procedure: CYST REMOVAL;  Surgeon: Hessie Dibble, MD;  Location: [facility];  Service: Orthopedics;  Laterality: Left;   FOOT SURGERY  2000   plantar fasciitis-left   LUMBAR DISC SURGERY  03-29-14   Fusion, revision 04-27-14 L4-5 Cauda Equina with graft   NERVE GRAFT  06-01-08   cadaver graft to right inferior alveolar nerve  in [place]   POLYPECTOMY  10/10/2020   Procedure: POLYPECTOMY;  Surgeon: Yetta Flock, MD;  Location: [facility];  Service: Gastroenterology;;   Social History:  reports that she quit smoking about 30 years ago. Her smoking use included cigarettes. She has never used smokeless tobacco. She reports that she does not currently use alcohol. She reports that she does not use drugs. Family History:  Family History  Problem Relation Age of Onset   Cancer Mother        breast   Cancer Father        lung   Diabetes Father    Colon cancer Sister    Cancer Sister 58       colon   Diabetes Sister    Cancer Maternal Aunt        breast - Guzman   Anesthesia problems Neg Hx      HOME MEDICATIONS: Allergies as of 03/16/2022       Reactions   Vicodin [hydrocodone-acetaminophen] Nausea And Vomiting        Medication List        Accurate as of March 16, 2022 12:38 PM. If you have any questions, ask your nurse or doctor.          Airborne Gummies Google 3 tablets by mouth every morning.   albuterol (2.5 MG/3ML) 0.083% nebulizer solution Commonly known as: PROVENTIL 1 VIAL IN  NEBULIZER EVERY 4 HOURS AS NEEDED FOR WHEEZING   albuterol 108 (90 Base) MCG/ACT inhaler Commonly known as: VENTOLIN HFA Inhale 2 puffs into the lungs every 4 (four) hours as needed for wheezing or shortness of breath.   atorvastatin 10 MG tablet Commonly known as: LIPITOR TAKE 1 TABLET BY MOUTH EVERY DAY   buprenorphine 15 MCG/HR Commonly known as: BUTRANS Place 1 patch onto  the skin every Monday. Uses 20 mcg at this time   cefadroxil 500 MG capsule Commonly known as: DURICEF Take 1 capsule (500 mg total) by mouth 2 (two) times daily. Take at first signs of recurrent cellulitis What changed:  when to take this reasons to take this   Contour Next Monitor w/Device Kit 1 each by Does not apply route 2 (two) times daily. To check blood sugars twice a day.   Dexcom G7 Receiver Devi Use to check blood sugar. E11.65   Dexcom G7 Sensor Misc 1 Device by Does not apply route as directed.   furosemide 40 MG tablet Commonly known as: LASIX TAKE 1 TABLET BY MOUTH TWICE A DAY What changed:  when to take this additional instructions   gabapentin 300 MG capsule Commonly known as: NEURONTIN Take 300 mg by mouth 2 (two) times daily. Take with a 100 mg capsule for a total dose of 400 mg twice daily   gabapentin 100 MG capsule Commonly known as: NEURONTIN Take 100 mg by mouth 2 (two) times daily. Take with a 300 mg capsule for a total dose of 400 mg twice daily   glucose blood test strip Use as instructed   glucose blood test strip Commonly known as: Contour Next Test Use to test blood sugar 2 times daily   HumuLIN R U-500 KwikPen 500 UNIT/ML KwikPen Generic drug: insulin regular human CONCENTRATED Inject 90 Units into the skin daily before breakfast AND 94 Units daily before lunch AND 80 Units daily before supper. 90 units with breakfast, 90 units with lunch, and 80 units with supper. What changed: See the new instructions.   hydroxychloroquine 200 MG tablet Commonly known as:  PLAQUENIL Take 200 mg by mouth.   Insulin Pen Needle 31G X 8 MM Misc 1 Device by Does not apply route 3 (three) times daily.   levothyroxine 75 MCG tablet Commonly known as: SYNTHROID Take 1 tablet (75 mcg total) by mouth daily.   lidocaine 5 % ointment Commonly known as: XYLOCAINE Apply 1 application topically 2 (two) times daily as needed (pain).   metFORMIN 1000 MG tablet Commonly known as: GLUCOPHAGE Take 1 tablet (1,000 mg total) by mouth 2 (two) times daily with a meal. What changed: when to take this   methocarbamol 750 MG tablet Commonly known as: ROBAXIN Take 1 tablet (750 mg total) by mouth 2 (two) times daily. What changed: when to take this   multivitamin with minerals Tabs tablet Take 1 tablet by mouth daily.   ondansetron 4 MG tablet Commonly known as: ZOFRAN Take 1 tablet (4 mg total) by mouth as directed. Take one Zofran pill 30-60 minutes before each colonoscopy prep dose   pramipexole 0.5 MG tablet Commonly known as: MIRAPEX TAKE 1 TABLET BY MOUTH EVERYDAY AT BEDTIME   sertraline 100 MG tablet Commonly known as: ZOLOFT Take 2 tablets (200 mg total) by mouth at bedtime.   tirzepatide 2.5 MG/0.5ML Pen Commonly known as: MOUNJARO Inject 2.5 mg into the skin once a week.   Vitamin D3 50 MCG (2000 UT) Tabs Generic drug: Cholecalciferol Take 2,000 Units by mouth every morning.   Xarelto 20 MG Tabs tablet Generic drug: rivaroxaban TAKE 1 TABLET BY MOUTH EVERY DAY         OBJECTIVE:   Vital Signs: BP 120/78 (BP Location: Left Arm, Patient Position: Sitting, Cuff Size: Large)   Pulse 78   Ht '5\' 7"'$  (1.702 m)   Wt (!) 429 lb (194.6 kg)  SpO2 99%   BMI 67.19 kg/m   Wt Readings from Last 3 Encounters:  03/16/22 (!) 429 lb (194.6 kg)  02/19/22 (!) 432 lb (196 kg)  12/08/21 (!) 438 lb (198.7 kg)     Exam: General: Pt appears well and is in NAD, in a wheelchair  Lungs: Clear with good BS bilat   Heart: RRR  Extremities: Trace edema    Neuro: MS is good with appropriate affect, pt is alert and Ox3   DM Foot Exam 03/16/2022  The skin of the feet is intact without sores or ulcerations. The pedal pulses are 1+ on right and 1+ on left. The sensation is decreased  to a screening 5.07, 10 gram monofilament bilaterally    DATA REVIEWED:  Lab Results  Component Value Date   HGBA1C 7.2 (A) 03/16/2022   HGBA1C 8.8 (A) 11/08/2021   HGBA1C 9.6 (A) 05/11/2021    Latest Reference Range & Units 09/04/21 18:08  Sodium 135 - 145 mmol/L 138  Potassium 3.5 - 5.1 mmol/L 4.3  Chloride 98 - 111 mmol/L 101  CO2 22 - 32 mmol/L 28  Glucose 70 - 99 mg/dL 255 (H)  BUN 6 - 20 mg/dL 16  Creatinine 0.44 - 1.00 mg/dL 1.15 (H)  Calcium 8.9 - 10.3 mg/dL 9.1  Anion gap 5 - 15  9  GFR, Estimated >60 mL/min 56 (L)    ASSESSMENT / PLAN / RECOMMENDATIONS:   1) Type 2 Diabetes Mellitus, Optimally  controlled, With retinopathic and neuropathic complications - Most recent A1c of 7.2 %. Goal A1c < 7.0 %.   -I have praised the patient on improved glycemic control -She has been tolerating Mounjaro, we discussed increasing the dose as below and reducing her insulin -She had already self reduced insulin due to hypoglycemia -Patient urged to have an eye exam   MEDICATIONS:  -Change Humulin U-500 at 60 units before breakfast, 36 units before lunch and 36 units before supper - Continue Metformin 1000 mg daily beakfast  -Increase Mounjaro 5 mg weekly   EDUCATION / INSTRUCTIONS: BG monitoring instructions: Patient is instructed to check her blood sugars 3 times a day, before each meal Call [facility] if: BG persistently < 70  I reviewed the Rule of 15 for the treatment of hypoglycemia in detail with the patient. Literature supplied.    2) Diabetic complications:  Eye: Does have known diabetic retinopathy. Per pt (mild) Neuro/ Feet: Does have known diabetic peripheral neuropathy .  Renal: Patient does not have known  baseline CKD. She   is not on an ACEI/ARB at present.   3) Dyslipidemia:   -Her LDL has been fluctuating -Will continue to monitor, if this has not been done prior to her next visit we will repeat lipid panel  Medication Continue atorvastatin 10 mg daily     F/U in 6 months    Signed electronically by: Mack Guise, MD  [facility]  [facility] [address] [place], [place] [address] Phone: [phone] FAX: [phone]   CC: Laurey Morale, [place] [address] Phone: [phone]  Fax: [phone]  Return to Endocrinology clinic as below: Future Appointments  Date Time Provider [place]  07/10/2022  3:00 PM Herring, Theo Dills, [facility] None

## 2022-03-16 NOTE — Patient Instructions (Addendum)
-   Change Humulin U-500 to 60 units with Breakfast and 36 units with Lunch and 36 units with Supper  - Continue Metformin 500 mg, daily  - Increase Mounjaro 5 mg once weekly     HOW TO TREAT LOW BLOOD SUGARS (Blood sugar LESS THAN 70 MG/DL) Please follow the RULE OF 15 for the treatment of hypoglycemia treatment (when your (blood sugars are less than 70 mg/dL)   STEP 1: Take 15 grams of carbohydrates when your blood sugar is low, which includes:  3-4 GLUCOSE TABS  OR 3-4 OZ OF JUICE OR REGULAR SODA OR ONE TUBE OF GLUCOSE GEL    STEP 2: RECHECK blood sugar in 15 MINUTES STEP 3: If your blood sugar is still low at the 15 minute recheck --> then, go back to STEP 1 and treat AGAIN with another 15 grams of carbohydrates.

## 2022-03-18 ENCOUNTER — Emergency Department (HOSPITAL_BASED_OUTPATIENT_CLINIC_OR_DEPARTMENT_OTHER)
Admission: EM | Admit: 2022-03-18 | Discharge: 2022-03-19 | Disposition: A | Discharge status: Home / Self Care | Payer: Medicare Other | Attending: Emergency Medicine | Admitting: Emergency Medicine

## 2022-03-18 ENCOUNTER — Other Ambulatory Visit: Payer: Self-pay

## 2022-03-18 ENCOUNTER — Emergency Department (HOSPITAL_BASED_OUTPATIENT_CLINIC_OR_DEPARTMENT_OTHER): Payer: Medicare Other | Admitting: Radiology

## 2022-03-18 DIAGNOSIS — Z7984 Long term (current) use of oral hypoglycemic drugs: Secondary | ICD-10-CM | POA: Insufficient documentation

## 2022-03-18 DIAGNOSIS — S79921A Unspecified injury of right thigh, initial encounter: Secondary | ICD-10-CM | POA: Diagnosis present

## 2022-03-18 DIAGNOSIS — Y92009 Unspecified place in unspecified non-institutional (private) residence as the place of occurrence of the external cause: Secondary | ICD-10-CM | POA: Insufficient documentation

## 2022-03-18 DIAGNOSIS — W06XXXA Fall from bed, initial encounter: Secondary | ICD-10-CM | POA: Diagnosis not present

## 2022-03-18 DIAGNOSIS — Z79899 Other long term (current) drug therapy: Secondary | ICD-10-CM | POA: Insufficient documentation

## 2022-03-18 DIAGNOSIS — Z7901 Long term (current) use of anticoagulants: Secondary | ICD-10-CM | POA: Diagnosis not present

## 2022-03-18 DIAGNOSIS — M25551 Pain in right hip: Secondary | ICD-10-CM | POA: Diagnosis not present

## 2022-03-18 DIAGNOSIS — S76211A Strain of adductor muscle, fascia and tendon of right thigh, initial encounter: Secondary | ICD-10-CM

## 2022-03-18 DIAGNOSIS — W19XXXA Unspecified fall, initial encounter: Secondary | ICD-10-CM | POA: Insufficient documentation

## 2022-03-18 DIAGNOSIS — Z794 Long term (current) use of insulin: Secondary | ICD-10-CM | POA: Insufficient documentation

## 2022-03-18 DIAGNOSIS — M1611 Unilateral primary osteoarthritis, right hip: Secondary | ICD-10-CM | POA: Diagnosis not present

## 2022-03-18 DIAGNOSIS — S76811A Strain of other specified muscles, fascia and tendons at thigh level, right thigh, initial encounter: Secondary | ICD-10-CM | POA: Diagnosis not present

## 2022-03-18 MED ORDER — IBUPROFEN 800 MG PO TABS
800.0000 mg | ORAL_TABLET | Freq: Once | ORAL | Status: AC
Start: 2022-03-18 — End: 2022-03-18
  Administered 2022-03-18: 800 mg via ORAL
  Filled 2022-03-18: qty 1

## 2022-03-18 NOTE — ED Triage Notes (Signed)
Patient arrives with complaints of right hip pain after a fall earlier today. Patient states that she her foot slipped and she fell, injuring her right hip. She initially went to an Urgent Ortho office who referred her here for further evaluation.   She did not hit her head. She does take blood thinners (for Pulmonary Embolisms).

## 2022-03-18 NOTE — ED Notes (Signed)
Patient requesting to stay in chair versus lie in bed.

## 2022-03-18 NOTE — ED Provider Notes (Signed)
[facility]  Provider Note  CSN: [id number] Arrival date & time: 03/18/22 1845  History Chief Complaint  Patient presents with   Fall   Hip Pain    Right    Cheyenne Guzman is a 57 y.o. female with history of multiple medical problems including RA and chronic pain in Buprenorphine patch reports she slipped getting out of bed around 0300 this morning and 'did a split', injuring her R hip. She rested for most of the day but began having increased pain in R groin, worse with movement and unable to bear weight. She went to [facility] walk-in clinic and was told they don't do xrays for traumatic hip pain and redirected her to the ED. She has not had any imaging. She is on Xarelto for prior PE but denies any head injury. She took some motrin around 1000hrs with some improvement.    Home Medications Prior to Admission medications   Medication Sig Start Date End Date Taking? Authorizing Provider  albuterol (PROVENTIL) (2.5 MG/3ML) 0.083% nebulizer solution 1 VIAL IN NEBULIZER EVERY 4 HOURS AS NEEDED FOR WHEEZING 10/13/13   Laurey Morale, MD  albuterol (VENTOLIN HFA) 108 (90 Base) MCG/ACT inhaler Inhale 2 puffs into the lungs every 4 (four) hours as needed for wheezing or shortness of breath. 04/05/21   Laurey Morale, MD  atorvastatin (LIPITOR) 10 MG tablet TAKE 1 TABLET BY MOUTH EVERY DAY 12/18/21   Shamleffer, Melanie Crazier, MD  Blood Glucose Monitoring Suppl (CONTOUR NEXT MONITOR) w/Device KIT 1 each by Does not apply route 2 (two) times daily. To check blood sugars twice a day. 08/27/16   Renato Shin, MD  Buprenorphine 15 MCG/HR PTWK Place 1 patch onto the skin every Monday. Uses 20 mcg at this time 07/23/17   [provider]  cefadroxil (DURICEF) 500 MG capsule Take 1 capsule (500 mg total) by mouth 2 (two) times daily. Take at first signs of recurrent cellulitis Patient taking differently: Take 500 mg by mouth as needed. Take at first signs  of recurrent cellulitis 10/07/20   Tommy Medal, Lavell Islam, MD  Cholecalciferol (VITAMIN D3) 50 MCG (2000 UT) TABS Take 2,000 Units by mouth every morning.    [provider]  Continuous Blood Gluc Receiver (DEXCOM G7 RECEIVER) DEVI Use to check blood sugar. E11.65 08/30/21   Shamleffer, Melanie Crazier, MD  Continuous Blood Gluc Sensor (DEXCOM G7 SENSOR) MISC 1 Device by Does not apply route as directed. 08/24/21   Shamleffer, Melanie Crazier, MD  furosemide (LASIX) 40 MG tablet TAKE 1 TABLET BY MOUTH TWICE A DAY Patient taking differently: Take 40 mg by mouth See admin instructions. Take one tablet (40 mg) by mouth daily when not going out - approximately 5 times week 05/17/20   Laurey Morale, MD  gabapentin (NEURONTIN) 100 MG capsule Take 100 mg by mouth 2 (two) times daily. Take with a 300 mg capsule for a total dose of 400 mg twice daily 08/31/20   [provider]  gabapentin (NEURONTIN) 300 MG capsule Take 300 mg by mouth 2 (two) times daily. Take with a 100 mg capsule for a total dose of 400 mg twice daily    [provider]  glucose blood (CONTOUR NEXT TEST) test strip Use to test blood sugar 2 times daily 12/18/16   Renato Shin, MD  glucose blood test strip Use as instructed 08/24/16   Renato Shin, MD  hydroxychloroquine (PLAQUENIL) 200 MG tablet Take 200  mg by mouth. 11/28/21   [provider]  Insulin Pen Needle 31G X 8 MM MISC 1 Device by Does not apply route 3 (three) times daily. 05/11/21   Shamleffer, Melanie Crazier, MD  insulin regular human CONCENTRATED (HUMULIN R U-500 KWIKPEN) 500 UNIT/ML KwikPen Inject 60 Units into the skin daily before breakfast AND 36 Units daily before lunch AND 36 Units daily before supper. 90 units with breakfast, 90 units with lunch, and 80 units with supper. 03/16/22   Shamleffer, Melanie Crazier, MD  levothyroxine (SYNTHROID) 75 MCG tablet Take 1 tablet (75 mcg total) by mouth daily. 02/09/22   Laurey Morale, MD  lidocaine  (XYLOCAINE) 5 % ointment Apply 1 application topically 2 (two) times daily as needed (pain). 12/23/19   [provider]  metFORMIN (GLUCOPHAGE) 1000 MG tablet Take 1 tablet (1,000 mg total) by mouth daily with breakfast. 03/16/22   Shamleffer, Melanie Crazier, MD  methocarbamol (ROBAXIN) 750 MG tablet Take 1 tablet (750 mg total) by mouth 2 (two) times daily. Patient taking differently: Take 750 mg by mouth every morning. 02/03/15   Laurey Morale, MD  Multiple Vitamin (MULTIVITAMIN WITH MINERALS) TABS tablet Take 1 tablet by mouth daily.    [provider]  Multiple Vitamins-Minerals (AIRBORNE GUMMIES) CHEW Chew 3 tablets by mouth every morning.    [provider]  ondansetron (ZOFRAN) 4 MG tablet Take 1 tablet (4 mg total) by mouth as directed. Take one Zofran pill 30-60 minutes before each colonoscopy prep dose 09/29/20   Armbruster, Carlota Raspberry, MD  pramipexole (MIRAPEX) 0.5 MG tablet TAKE 1 TABLET BY MOUTH EVERYDAY AT BEDTIME 02/09/22   Laurey Morale, MD  sertraline (ZOLOFT) 100 MG tablet Take 2 tablets (200 mg total) by mouth at bedtime. 02/09/22   Laurey Morale, MD  tirzepatide [facility]) 5 MG/0.5ML Pen Inject 5 mg into the skin once a week. 03/16/22   Shamleffer, Melanie Crazier, MD  XARELTO 20 MG TABS tablet TAKE 1 TABLET BY MOUTH EVERY DAY 01/24/22   Laurey Morale, MD     Allergies    Vicodin [hydrocodone-acetaminophen]   Review of Systems   Review of Systems Please see HPI for pertinent positives and negatives  Physical Exam BP (!) 155/72   Pulse 71   Temp 98 F (36.7 C)   Resp 20   SpO2 99%   Physical Exam Vitals and nursing note reviewed.  Constitutional:      Appearance: Normal appearance. She is obese.  HENT:     Head: Normocephalic and atraumatic.     Nose: Nose normal.     Mouth/Throat:     Mouth: Mucous membranes are moist.  Eyes:     Extraocular Movements: Extraocular movements intact.     Conjunctiva/sclera: Conjunctivae normal.   Cardiovascular:     Rate and Rhythm: Normal rate.  Pulmonary:     Effort: Pulmonary effort is normal.     Breath sounds: Normal breath sounds.  Abdominal:     General: Abdomen is flat.     Palpations: Abdomen is soft.     Tenderness: There is no abdominal tenderness.  Musculoskeletal:        General: Tenderness (R lateral hip) present. No swelling (contusion L lateral lower leg).     Cervical back: Neck supple.  Skin:    General: Skin is warm and dry.  Neurological:     General: No focal deficit present.     Mental Status: She is alert.  Psychiatric:  Mood and Affect: Mood normal.     ED Results / Procedures / Treatments   EKG None  Procedures Procedures  Medications Ordered in the ED Medications  ibuprofen (ADVIL) tablet 800 mg (has no administration in time range)    Initial Impression and Plan  Patient here with mechanical fall with R hip pain. Went to Ortho [facility] but did not have imaging, she was apparently told she would need to go straight to CT. Will give additional pain medication and send for xray as an initial imaging modality. Suspect pelvic fracture vs ligamentous injury. Low suspicion for hip fracture.   ED Course       MDM Rules/Calculators/A&P Medical Decision Making Amount and/or Complexity of Data Reviewed Radiology: ordered.  Risk Prescription drug management.     Final Clinical Impression(s) / ED Diagnoses Final diagnoses:  None    Rx / DC Orders ED Discharge Orders     None

## 2022-03-19 ENCOUNTER — Emergency Department (HOSPITAL_BASED_OUTPATIENT_CLINIC_OR_DEPARTMENT_OTHER): Payer: Medicare Other

## 2022-03-19 ENCOUNTER — Encounter: Payer: Self-pay | Admitting: Internal Medicine

## 2022-03-21 ENCOUNTER — Other Ambulatory Visit: Payer: Self-pay | Admitting: Internal Medicine

## 2022-03-21 DIAGNOSIS — E785 Hyperlipidemia, unspecified: Secondary | ICD-10-CM

## 2022-03-23 ENCOUNTER — Telehealth: Payer: Self-pay

## 2022-03-23 NOTE — Telephone Encounter (Signed)
        Patient  visited [facility] on 2/26    Telephone encounter attempt :   1st A HIPAA compliant voice message was left requesting a return call.  Instructed patient to call back   [facility] [phone] [address], [place], [place] [address] Phone: [phone] Email: [email or web address]

## 2022-03-24 ENCOUNTER — Emergency Department (HOSPITAL_BASED_OUTPATIENT_CLINIC_OR_DEPARTMENT_OTHER): Payer: Medicare Other

## 2022-03-24 ENCOUNTER — Other Ambulatory Visit: Payer: Self-pay

## 2022-03-24 ENCOUNTER — Encounter (HOSPITAL_BASED_OUTPATIENT_CLINIC_OR_DEPARTMENT_OTHER): Payer: Self-pay | Admitting: Emergency Medicine

## 2022-03-24 ENCOUNTER — Emergency Department (HOSPITAL_BASED_OUTPATIENT_CLINIC_OR_DEPARTMENT_OTHER)
Admission: EM | Admit: 2022-03-24 | Discharge: 2022-03-24 | Disposition: A | Discharge status: Home / Self Care | Payer: Medicare Other | Attending: Emergency Medicine | Admitting: Emergency Medicine

## 2022-03-24 DIAGNOSIS — L03116 Cellulitis of left lower limb: Secondary | ICD-10-CM | POA: Diagnosis not present

## 2022-03-24 DIAGNOSIS — W0110XA Fall on same level from slipping, tripping and stumbling with subsequent striking against unspecified object, initial encounter: Secondary | ICD-10-CM | POA: Diagnosis not present

## 2022-03-24 DIAGNOSIS — S8991XA Unspecified injury of right lower leg, initial encounter: Secondary | ICD-10-CM | POA: Diagnosis present

## 2022-03-24 DIAGNOSIS — M7989 Other specified soft tissue disorders: Secondary | ICD-10-CM | POA: Diagnosis not present

## 2022-03-24 DIAGNOSIS — Z7901 Long term (current) use of anticoagulants: Secondary | ICD-10-CM | POA: Diagnosis not present

## 2022-03-24 DIAGNOSIS — S8012XA Contusion of left lower leg, initial encounter: Secondary | ICD-10-CM

## 2022-03-24 DIAGNOSIS — Z794 Long term (current) use of insulin: Secondary | ICD-10-CM | POA: Diagnosis not present

## 2022-03-24 LAB — COMPREHENSIVE METABOLIC PANEL
ALT: 9 U/L (ref 0–44)
AST: 11 U/L — ABNORMAL LOW (ref 15–41)
Albumin: 4.4 g/dL (ref 3.5–5.0)
Alkaline Phosphatase: 81 U/L (ref 38–126)
Anion gap: 8 (ref 5–15)
BUN: 17 mg/dL (ref 6–20)
CO2: 30 mmol/L (ref 22–32)
Calcium: 9.9 mg/dL (ref 8.9–10.3)
Chloride: 100 mmol/L (ref 98–111)
Creatinine, Ser: 0.89 mg/dL (ref 0.44–1.00)
GFR, Estimated: >60 mL/min (ref >60)
Glucose, Bld: 241 mg/dL — ABNORMAL HIGH (ref 70–99)
Potassium: 4.7 mmol/L (ref 3.5–5.1)
Sodium: 138 mmol/L (ref 135–145)
Total Bilirubin: 0.5 mg/dL (ref 0.3–1.2)
Total Protein: 8.7 g/dL — ABNORMAL HIGH (ref 6.5–8.1)

## 2022-03-24 LAB — CBC WITH DIFFERENTIAL/PLATELET
Abs Immature Granulocytes: 0.05 10*3/uL (ref 0.00–0.07)
Basophils Absolute: 0 10*3/uL (ref 0.0–0.1)
Basophils Relative: 1 %
Eosinophils Absolute: 0.2 10*3/uL (ref 0.0–0.5)
Eosinophils Relative: 2 %
HCT: 42.1 % (ref 36.0–46.0)
Hemoglobin: 12.6 g/dL (ref 12.0–15.0)
Immature Granulocytes: 1 %
Lymphocytes Relative: 16 %
Lymphs Abs: 1.1 10*3/uL (ref 0.7–4.0)
MCH: 22.8 pg — ABNORMAL LOW (ref 26.0–34.0)
MCHC: 29.9 g/dL — ABNORMAL LOW (ref 30.0–36.0)
MCV: 76.1 fL — ABNORMAL LOW (ref 80.0–100.0)
Monocytes Absolute: 0.4 10*3/uL (ref 0.1–1.0)
Monocytes Relative: 7 %
Neutro Abs: 4.8 10*3/uL (ref 1.7–7.7)
Neutrophils Relative %: 73 %
Platelets: 203 10*3/uL (ref 150–400)
RBC: 5.53 MIL/uL — ABNORMAL HIGH (ref 3.87–5.11)
RDW: 17.4 % — ABNORMAL HIGH (ref 11.5–15.5)
WBC: 6.6 10*3/uL (ref 4.0–10.5)
nRBC: 0 % (ref 0.0–0.2)

## 2022-03-24 LAB — LACTIC ACID, PLASMA: Lactic Acid, Venous: 1.1 mmol/L (ref 0.5–1.9)

## 2022-03-24 NOTE — ED Triage Notes (Signed)
Pt fell last week , her right hip was hurting at that time, now her left leg is bruised and has become red/swollen/tight from same fall. Pt has hx of cellulitis

## 2022-03-24 NOTE — ED Provider Notes (Signed)
[facility] Provider Note   CSN: [id number] Arrival date & time: 03/24/22  1311     History  No chief complaint on file.   Cheyenne Guzman is a 57 y.o. female.  Pt reports she fell a on 2/25 and injured her right hip.  Patient reports that she was seen here and had a CT scan of her hip.  Patient reports when she fell she also hit her left lower leg.  Patient reports she has had a bruised area to her left lower leg.  Patient states that now has increased swelling redness and pain.  Patient is concerned that she has cellulitis.  Patient started taking Duricef.  Patient has taken 1 dose.  Patient reports that she keeps the antibiotic on hand because Dr. Drucilla Schmidt told her she should start taking the antibiotic if she had any signs of cellulitis.  Patient reports that she has been hospitalized multiple times with cellulitis and sepsis.  Patient reports that she is on Xarelto due to a history of pulmonary embolus.  Patient reports she has not missed any doses of Xarelto        Home Medications Prior to Admission medications   Medication Sig Start Date End Date Taking? Authorizing Provider  albuterol (PROVENTIL) (2.5 MG/3ML) 0.083% nebulizer solution 1 VIAL IN NEBULIZER EVERY 4 HOURS AS NEEDED FOR WHEEZING 10/13/13   Laurey Morale, MD  albuterol (VENTOLIN HFA) 108 (90 Base) MCG/ACT inhaler Inhale 2 puffs into the lungs every 4 (four) hours as needed for wheezing or shortness of breath. 04/05/21   Laurey Morale, MD  atorvastatin (LIPITOR) 10 MG tablet TAKE 1 TABLET BY MOUTH EVERY DAY 03/21/22   Shamleffer, Melanie Crazier, MD  Blood Glucose Monitoring Suppl (CONTOUR NEXT MONITOR) w/Device KIT 1 each by Does not apply route 2 (two) times daily. To check blood sugars twice a day. 08/27/16   Renato Shin, MD  Buprenorphine 15 MCG/HR PTWK Place 1 patch onto the skin every Monday. Uses 20 mcg at this time 07/23/17   [provider]  cefadroxil (DURICEF)  500 MG capsule Take 1 capsule (500 mg total) by mouth 2 (two) times daily. Take at first signs of recurrent cellulitis Patient taking differently: Take 500 mg by mouth as needed. Take at first signs of recurrent cellulitis 10/07/20   Tommy Medal, Lavell Islam, MD  Cholecalciferol (VITAMIN D3) 50 MCG (2000 UT) TABS Take 2,000 Units by mouth every morning.    [provider]  Continuous Blood Gluc Receiver (DEXCOM G7 RECEIVER) DEVI Use to check blood sugar. E11.65 08/30/21   Shamleffer, Melanie Crazier, MD  Continuous Blood Gluc Sensor (DEXCOM G7 SENSOR) MISC 1 Device by Does not apply route as directed. 08/24/21   Shamleffer, Melanie Crazier, MD  furosemide (LASIX) 40 MG tablet TAKE 1 TABLET BY MOUTH TWICE A DAY Patient taking differently: Take 40 mg by mouth See admin instructions. Take one tablet (40 mg) by mouth daily when not going out - approximately 5 times week 05/17/20   Laurey Morale, MD  gabapentin (NEURONTIN) 100 MG capsule Take 100 mg by mouth 2 (two) times daily. Take with a 300 mg capsule for a total dose of 400 mg twice daily 08/31/20   [provider]  gabapentin (NEURONTIN) 300 MG capsule Take 300 mg by mouth 2 (two) times daily. Take with a 100 mg capsule for a total dose of 400 mg twice daily    [provider]  glucose blood (CONTOUR NEXT TEST) test strip Use to test blood sugar 2 times daily 12/18/16   Renato Shin, MD  glucose blood test strip Use as instructed 08/24/16   Renato Shin, MD  hydroxychloroquine (PLAQUENIL) 200 MG tablet Take 200 mg by mouth. 11/28/21   [provider]  Insulin Pen Needle 31G X 8 MM MISC 1 Device by Does not apply route 3 (three) times daily. 05/11/21   Shamleffer, Melanie Crazier, MD  insulin regular human CONCENTRATED (HUMULIN R U-500 KWIKPEN) 500 UNIT/ML KwikPen Inject 60 Units into the skin daily before breakfast AND 36 Units daily before lunch AND 36 Units daily before supper. 90 units with breakfast, 90 units with lunch, and  80 units with supper. 03/16/22   Shamleffer, Melanie Crazier, MD  levothyroxine (SYNTHROID) 75 MCG tablet Take 1 tablet (75 mcg total) by mouth daily. 02/09/22   Laurey Morale, MD  lidocaine (XYLOCAINE) 5 % ointment Apply 1 application topically 2 (two) times daily as needed (pain). 12/23/19   [provider]  metFORMIN (GLUCOPHAGE) 1000 MG tablet Take 1 tablet (1,000 mg total) by mouth daily with breakfast. 03/16/22   Shamleffer, Melanie Crazier, MD  methocarbamol (ROBAXIN) 750 MG tablet Take 1 tablet (750 mg total) by mouth 2 (two) times daily. Patient taking differently: Take 750 mg by mouth every morning. 02/03/15   Laurey Morale, MD  Multiple Vitamin (MULTIVITAMIN WITH MINERALS) TABS tablet Take 1 tablet by mouth daily.    [provider]  Multiple Vitamins-Minerals (AIRBORNE GUMMIES) CHEW Chew 3 tablets by mouth every morning.    [provider]  ondansetron (ZOFRAN) 4 MG tablet Take 1 tablet (4 mg total) by mouth as directed. Take one Zofran pill 30-60 minutes before each colonoscopy prep dose 09/29/20   Armbruster, Carlota Raspberry, MD  pramipexole (MIRAPEX) 0.5 MG tablet TAKE 1 TABLET BY MOUTH EVERYDAY AT BEDTIME 02/09/22   Laurey Morale, MD  sertraline (ZOLOFT) 100 MG tablet Take 2 tablets (200 mg total) by mouth at bedtime. 02/09/22   Laurey Morale, MD  tirzepatide [facility]) 5 MG/0.5ML Pen Inject 5 mg into the skin once a week. 03/16/22   Shamleffer, Melanie Crazier, MD  XARELTO 20 MG TABS tablet TAKE 1 TABLET BY MOUTH EVERY DAY 01/24/22   Laurey Morale, MD      Allergies    Vicodin [hydrocodone-acetaminophen]    Review of Systems   Review of Systems  All other systems reviewed and are negative.   Physical Exam Updated Vital Signs BP (!) 132/107   Pulse 88   Temp 99 F (37.2 C) (Oral)   Resp 16   SpO2 94%  Physical Exam Vitals and nursing note reviewed.  Constitutional:      Appearance: She is well-developed.  HENT:     Head: Normocephalic.   Cardiovascular:     Rate and Rhythm: Normal rate.  Pulmonary:     Effort: Pulmonary effort is normal.  Abdominal:     General: There is no distension.  Musculoskeletal:        General: Swelling and tenderness present.     Cervical back: Normal range of motion.  Skin:    General: Skin is warm.  Neurological:     General: No focal deficit present.     Mental Status: She is alert and oriented to person, place, and time.  Psychiatric:        Mood and Affect: Mood normal.     ED Results / Procedures /  Treatments   Labs (all labs ordered are listed, but only abnormal results are displayed) Labs Reviewed  CULTURE, BLOOD (ROUTINE X 2)  CULTURE, BLOOD (ROUTINE X 2)  CBC WITH DIFFERENTIAL/PLATELET  COMPREHENSIVE METABOLIC PANEL  LACTIC ACID, PLASMA  LACTIC ACID, PLASMA    EKG None  Radiology No results found.  Procedures Procedures    Medications Ordered in ED Medications - No data to display  ED Course/ Medical Decision Making/ A&P                             Medical Decision Making Patient complains of swelling to her left lower leg after hitting her leg 1 week ago  Amount and/or Complexity of Data Reviewed Independent Historian:     Details: Patient is here with her daughter who is supportive External Data Reviewed: notes.    Details: Hospital notes reviewed Labs: ordered. Decision-making details documented in ED Course.    Details: Labs ordered reviewed and interpreted.  White blood cell count is normal lactic acid is normal Radiology: ordered and independent interpretation performed. Decision-making details documented in ED Course.    Details: Ultrasound/vascular imaging left leg shows no evidence of DVT  Risk Risk Details: After turf on and to see and evaluate patient.  Patient counseled on results she is advised to see her physician for recheck on Monday.  Patient is to continue taking Duricef as prescribed by Dr. Drucilla Schmidt.  Patient advised to return to the  emergency department if any problems.           Final Clinical Impression(s) / ED Diagnoses Final diagnoses:  Cellulitis of left leg  Hematoma of left lower leg    Rx / DC Orders ED Discharge Orders     None      An After Visit Summary was printed and given to the patient.    Fransico Meadow, [place] 03/24/22 1843    Wyvonnia Dusky, MD 03/24/22 [phone]

## 2022-03-27 ENCOUNTER — Telehealth: Payer: Self-pay

## 2022-03-27 NOTE — Telephone Encounter (Signed)
        Patient  visited [facility] on 03/24/2022  for Cellulitis of left leg.   Telephone encounter attempt :  1st  A HIPAA compliant voice message was left requesting a return call.  Instructed patient to call back at [phone].   [facility] Resource Care Guide   ??[email or web address]  ?? [phone]   Website: [email or web address]  [email or web address]

## 2022-03-28 ENCOUNTER — Telehealth: Payer: Self-pay

## 2022-03-28 NOTE — Telephone Encounter (Signed)
     Patient  visit on 03/24/2022  at [facility] was for Cellulitis of left leg.  Have you been able to follow up with your primary care physician? Patient stated she is feeling better.  The patient was or was not able to obtain any needed medicine or equipment. No medication prescribed.  Are there diet recommendations that you are having difficulty following? No  Patient expresses understanding of discharge instructions and education provided has no other needs at this time. Yes   [facility] Resource Care Guide   ??[email or web address]  ?? [phone]   Website: [email or web address]  [email or web address]

## 2022-03-29 LAB — CULTURE, BLOOD (ROUTINE X 2)
Culture: NO GROWTH
Culture: NO GROWTH
Special Requests: ADEQUATE
Special Requests: ADEQUATE

## 2022-04-02 ENCOUNTER — Encounter: Payer: Self-pay | Admitting: Family Medicine

## 2022-04-02 ENCOUNTER — Telehealth (INDEPENDENT_AMBULATORY_CARE_PROVIDER_SITE_OTHER): Payer: Medicare Other | Admitting: Family Medicine

## 2022-04-02 ENCOUNTER — Ambulatory Visit: Payer: Medicare Other | Admitting: Family Medicine

## 2022-04-02 DIAGNOSIS — S8012XD Contusion of left lower leg, subsequent encounter: Secondary | ICD-10-CM | POA: Diagnosis not present

## 2022-04-02 DIAGNOSIS — S76011D Strain of muscle, fascia and tendon of right hip, subsequent encounter: Secondary | ICD-10-CM

## 2022-04-02 NOTE — Progress Notes (Signed)
Subjective:    Patient ID: Cheyenne Guzman, female    DOB: April 04, 1965, 57 y.o.   MRN: [id number]  HPI Virtual Visit via Video Note  I connected with the patient on 04/02/22 at  2:45 PM EDT by a video enabled telemedicine application and verified that I am speaking with the correct person using two identifiers.  Location patient: home Location provider:work or home office Persons participating in the virtual visit: patient, provider  I discussed the limitations of evaluation and management by telemedicine and the availability of in person appointments. The patient expressed understanding and agreed to proceed.   HPI: Here to follow up on injuries resulting from a fall at  home on 03-18-22. That day as she was getting out of her recliner, her foot slipped causing her to fall onto the floor doing a split. She says the right leg was out in front of her, and the  left leg was in the back. She immediately had severe pain in the right hip,and she had bruised the left calf. She went to the ED that day, and she had Xrays of the right hip and a CT of the pelvis. These were all negative for fractures. She was given a single dose of Ibuprofen and sent home. She then returned to the ED on 03-24-22 for swelling and pain in the left calf. An US of the leg was negative for DVT, and she was diagnosed with a hematoma of the calf. She was told to take Tylenol as needed and to avoid Ibuprofen because she takes Xarelto. Since then she has continued to experience severe pain in the right hip whenever she moves it at all. She cannot bear any weight on it.    ROS: See pertinent positives and negatives per HPI.  Past Medical History:  Diagnosis Date   Anxiety    per pt. - mild    Arthritis    back & ankles    Asthma    seasonal    Breast cancer (HCC)    Burning sensation of mouth 10/07/2020   Cancer ([place])    breast, sees Dr. Jana Hakim , Left - 11/2007   Candida vaginitis 10/07/2020   Cellulitis 2020   severe  - Left leg    Complication of anesthesia 2013   severe muscle spasms-sore-no doc   Depression    Diabetes mellitus    sees Dr. Chalmers Cater, diagnosed 2003   HA (headache)    Hearing loss    Went to [facility] and Throat,Dr Laing,both ears   Hyperlipidemia    Kidney infection    - hosp. [facility]- septic- 10/2009   Low back pain    Muscle tear 10/07/2020   Peripheral neuropathy    R sided numbness- face & jaw   Pulmonary embolism ([place]) 2020   Sepsis ([place]) 2020   Sleep apnea 09/2013   severe-just started cpap-doing well   Sore throat 10/07/2020   TIA (transient ischemic attack)    in 2007,2008, and 2011   Tinnitus of right ear    Urosepsis 08/2009   with Klebsiella    Past Surgical History:  Procedure Laterality Date   ABDOMINAL HYSTERECTOMY  08/2004   BREAST BIOPSY  06/22/2011   Procedure: BREAST BIOPSY;  Surgeon: Stark Klein, MD;  Location: [facility];  Service: General;  Laterality: Right;   BREAST LUMPECTOMY     BREAST SURGERY  11/2007   left ductal carcinoma in situ   CARPAL TUNNEL RELEASE Right 10/13/2013  Procedure: RIGHT CARPAL TUNNEL RELEASE;  Surgeon: Hessie Dibble, MD;  Location: [facility];  Service: Orthopedics;  Laterality: Right;   CARPAL TUNNEL RELEASE Left 11/24/2013   Procedure: LEFT CARPAL TUNNEL RELEASE WITH CYST EXCISION ;  Surgeon: Hessie Dibble, MD;  Location: [facility];  Service: Orthopedics;  Laterality: Left;   COLONOSCOPY WITH PROPOFOL N/A 10/10/2020   Procedure: COLONOSCOPY WITH PROPOFOL;  Surgeon: Yetta Flock, MD;  Location: [facility];  Service: Gastroenterology;  Laterality: N/A;   EAR CYST EXCISION Left 11/24/2013   Procedure: CYST REMOVAL;  Surgeon: Hessie Dibble, MD;  Location: [facility];  Service: Orthopedics;  Laterality: Left;   FOOT SURGERY  2000   plantar fasciitis-left   LUMBAR DISC SURGERY  03-29-14   Fusion, revision 04-27-14 L4-5 C[place]a Equina with graft   NERVE GRAFT  06-01-08    cadaver graft to right inferior alveolar nerve  in [place]   POLYPECTOMY  10/10/2020   Procedure: POLYPECTOMY;  Surgeon: Yetta Flock, MD;  Location: [facility];  Service: Gastroenterology;;    Family History  Problem Relation Age of Onset   Cancer Mother        breast   Cancer Father        lung   Diabetes Father    Colon cancer Sister    Cancer Sister 40       colon   Diabetes Sister    Cancer Maternal Aunt        breast - both   Anesthesia problems Neg Hx      Current Outpatient Medications:    albuterol (PROVENTIL) (2.5 MG/3ML) 0.083% nebulizer solution, 1 VIAL IN NEBULIZER EVERY 4 HOURS AS NEEDED FOR WHEEZING, Disp: 75 mL, Rfl: 1   albuterol (VENTOLIN HFA) 108 (90 Base) MCG/ACT inhaler, Inhale 2 puffs into the lungs every 4 (four) hours as needed for wheezing or shortness of breath., Disp: 18 g, Rfl: 1   atorvastatin (LIPITOR) 10 MG tablet, TAKE 1 TABLET BY MOUTH EVERY DAY, Disp: 90 tablet, Rfl: 0   Blood Glucose Monitoring Suppl (CONTOUR NEXT MONITOR) w/Device KIT, 1 each by Does not apply route 2 (two) times daily. To check blood sugars twice a day., Disp: 1 kit, Rfl: 0   Buprenorphine 15 MCG/HR PTWK, Place 1 patch onto the skin every Monday. Uses 20 mcg at this time, Disp: , Rfl: 0   cefadroxil (DURICEF) 500 MG capsule, Take 1 capsule (500 mg total) by mouth 2 (two) times daily. Take at first signs of recurrent cellulitis (Patient taking differently: Take 500 mg by mouth as needed. Take at first signs of recurrent cellulitis), Disp: 120 capsule, Rfl: 6   Cholecalciferol (VITAMIN D3) 50 MCG (2000 UT) TABS, Take 2,000 Units by mouth every morning., Disp: , Rfl:    Continuous Blood Gluc Receiver ([place]) DEVI, Use to check blood sugar. E11.65, Disp: 1 each, Rfl: 0   Continuous Blood Gluc Sensor (DEXCOM G7 SENSOR) MISC, 1 Device by Does not apply route as directed., Disp: 9 each, Rfl: 3   furosemide (LASIX) 40 MG tablet, TAKE 1 TABLET BY MOUTH TWICE A DAY  (Patient taking differently: Take 40 mg by mouth See admin instructions. Take one tablet (40 mg) by mouth daily when not going out - approximately 5 times week), Disp: 180 tablet, Rfl: 0   gabapentin (NEURONTIN) 100 MG capsule, Take 100 mg by mouth 2 (two) times daily. Take with a 300 mg capsule  for a total dose of 400 mg twice daily, Disp: , Rfl:    gabapentin (NEURONTIN) 300 MG capsule, Take 300 mg by mouth 2 (two) times daily. Take with a 100 mg capsule for a total dose of 400 mg twice daily, Disp: , Rfl:    glucose blood (CONTOUR NEXT TEST) test strip, Use to test blood sugar 2 times daily, Disp: 100 each, Rfl: 12   glucose blood test strip, Use as instructed, Disp: 100 each, Rfl: 0   hydroxychloroquine (PLAQUENIL) 200 MG tablet, Take 200 mg by mouth., Disp: , Rfl:    Insulin Pen Needle 31G X 8 MM MISC, 1 Device by Does not apply route 3 (three) times daily., Disp: 300 each, Rfl: 3   insulin regular human CONCENTRATED (HUMULIN R U-500 KWIKPEN) 500 UNIT/ML KwikPen, Inject 60 Units into the skin daily before breakfast AND 36 Units daily before lunch AND 36 Units daily before supper. 90 units with breakfast, 90 units with lunch, and 80 units with supper., Disp: 15 mL, Rfl: 11   levothyroxine (SYNTHROID) 75 MCG tablet, Take 1 tablet (75 mcg total) by mouth daily., Disp: 90 tablet, Rfl: 0   lidocaine (XYLOCAINE) 5 % ointment, Apply 1 application topically 2 (two) times daily as needed (pain)., Disp: , Rfl:    metFORMIN (GLUCOPHAGE) 1000 MG tablet, Take 1 tablet (1,000 mg total) by mouth daily with breakfast., Disp: 90 tablet, Rfl: 3   methocarbamol (ROBAXIN) 750 MG tablet, Take 1 tablet (750 mg total) by mouth 2 (two) times daily. (Patient taking differently: Take 750 mg by mouth every morning.), Disp: 60 tablet, Rfl: 5   Multiple Vitamin (MULTIVITAMIN WITH MINERALS) TABS tablet, Take 1 tablet by mouth daily., Disp: , Rfl:    Multiple Vitamins-Minerals (AIRBORNE GUMMIES) CHEW, Chew 3 tablets by mouth  every morning., Disp: , Rfl:    ondansetron (ZOFRAN) 4 MG tablet, Take 1 tablet (4 mg total) by mouth as directed. Take one Zofran pill 30-60 minutes before each colonoscopy prep dose, Disp: 2 tablet, Rfl: 0   pramipexole (MIRAPEX) 0.5 MG tablet, TAKE 1 TABLET BY MOUTH EVERYDAY AT BEDTIME, Disp: 90 tablet, Rfl: 0   sertraline (ZOLOFT) 100 MG tablet, Take 2 tablets (200 mg total) by mouth at bedtime., Disp: 180 tablet, Rfl: 0   tirzepatide (MOUNJARO) 5 MG/0.5ML Pen, Inject 5 mg into the skin once a week., Disp: 6 mL, Rfl: 3   XARELTO 20 MG TABS tablet, TAKE 1 TABLET BY MOUTH EVERY DAY, Disp: 30 tablet, Rfl: 3  EXAM:  VITALS per patient if applicable:  GENERAL: alert, oriented, appears well and in no acute distress  HEENT: atraumatic, conjunttiva clear, no obvious abnormalities on inspection of external nose and ears  NECK: normal movements of the head and neck  LUNGS: on inspection no signs of respiratory distress, breathing rate appears normal, no obvious gross SOB, gasping or wheezing  CV: no obvious cyanosis  MS: moves all visible extremities without noticeable abnormality  PSYCH/NEURO: pleasant and cooperative, no obvious depression or anxiety, speech and thought processing grossly intact  ASSESSMENT AND PLAN: She has a soft tissue injury to the right hip, possibly an acetabular tear. We will refer her urgently to Orthopedics to evaluate. The hematoma in the left calf will resolve slowly over the next few weeks. We spent a total of (34   ) minutes reviewing records and discussing these issues.  [name], MD  Discussed the following assessment and plan:  No diagnosis found.  I discussed the assessment and treatment plan with the patient. The patient was provided an opportunity to ask questions and all were answered. The patient agreed with the plan and demonstrated an understanding of the instructions.   The patient was advised to call back or seek an in-person evaluation  if the symptoms worsen or if the condition fails to improve as anticipated.      Review of Systems     Objective:   Physical Exam        Assessment & Plan:

## 2022-04-04 ENCOUNTER — Encounter: Payer: Self-pay | Admitting: Family Medicine

## 2022-04-04 DIAGNOSIS — M25551 Pain in right hip: Secondary | ICD-10-CM | POA: Diagnosis not present

## 2022-04-04 NOTE — Telephone Encounter (Signed)
Yes she can wear the compression hose over the hematoma

## 2022-04-11 ENCOUNTER — Telehealth: Payer: Self-pay

## 2022-04-11 NOTE — Progress Notes (Unsigned)
Care Management & Coordination Services Pharmacy Team  Reason for Encounter: General adherence update   Contacted patient for general health update and medication adherence call.  {US HC Outreach:28874}  What concerns do you have about your medications?***  The patient {denies/reports:25180} side effects with their medications.   How often do you forget or accidentally miss a dose? {missed doses:25554}  Do you use a pillbox? {yes/no:20286}  Are you having any problems getting your medications from your pharmacy? {yes/no:20286}  Has the cost of your medications been a concern? {yes/no:20286} If yes, what medication and is patient assistance available or has it been applied for?  Since last visit with PharmD, the following interventions have been made.  Changed Humulin R 500 quickpen to Inject 60 Units into the skin daily before breakfast AND 36 Units daily before lunch AND 36 Units daily before supper. 90 units with breakfast, 90 units with lunch, and 80 units with supper. Decreased Metformin 1000 mg daily at breakfast Decreased Tirzpatide 5 mg weekly.  The patient has had an ED visit since last contact.   The patient {denies/reports:25180} problems with their health.   Patient {denies/reports:25180} concerns or questions for ***, PharmD at this time.   Counseled patient on: {GENERALCOUNSELING:28686}   Chart Updates:  Recent office visits:  04/02/2022 [name] MD - Patient was seen for Hip strain, right, subsequent encounter and additional concerns. No medication changes.   Recent consult visits:  03/16/2022 Beverley Fiedler [facility] MD (endo) - Patient was seen for Type 2 diabetes mellitus with hyperglycemia, with long-term current use of insulin. Changed Humulin R 500 quickpen to Inject 60 Units into the skin daily before breakfast AND 36 Units daily before lunch AND 36 Units daily before supper. 90 units with breakfast, 90 units with lunch, and 80 units with supper.   Decreased Metformin 1000 mg daily at breakfast and Tirzpatide 5 mg weekly.   Hospital visits:  Patient was seen at [facility] ED [place] on 03/24/2022 (4 hours) due to Cellulitis of left leg and hematoma of left lower leg.  New?Medications Started at [facility] Discharge:?? None Medication Changes at Hospital Discharge: None Medications Discontinued at Hospital Discharge: None Medications that remain the same after Hospital Discharge:??  -All other medications will remain the same.     Patient was seen at [facility] [place] on 03/18/2022 (2 hours) due to Fall in home, initial encounter.   New?Medications Started at [facility] Discharge:?? None Medication Changes at Hospital Discharge: None Medications Discontinued at Hospital Discharge: None Medications that remain the same after Hospital Discharge:??  -All other medications will remain the same.   Medications: Outpatient Encounter Medications as of 04/11/2022  Medication Sig Note   albuterol (PROVENTIL) (2.5 MG/3ML) 0.083% nebulizer solution 1 VIAL IN NEBULIZER EVERY 4 HOURS AS NEEDED FOR WHEEZING    albuterol (VENTOLIN HFA) 108 (90 Base) MCG/ACT inhaler Inhale 2 puffs into the lungs every 4 (four) hours as needed for wheezing or shortness of breath.    atorvastatin (LIPITOR) 10 MG tablet TAKE 1 TABLET BY MOUTH EVERY DAY    Blood Glucose Monitoring Suppl (CONTOUR NEXT MONITOR) w/Device KIT 1 each by Does not apply route 2 (two) times daily. To check blood sugars twice a day.    Buprenorphine 15 MCG/HR PTWK Place 1 patch onto the skin every Monday. Uses 20 mcg at this time 09/19/2020: #4/28 days [facility] - current patch is on upper right arm   cefadroxil (DURICEF) 500 MG capsule Take  1 capsule (500 mg total) by mouth 2 (two) times daily. Take at first signs of recurrent cellulitis (Patient taking differently: Take 500 mg by mouth as needed. Take at first signs of recurrent cellulitis)    Cholecalciferol  (VITAMIN D3) 50 MCG (2000 UT) TABS Take 2,000 Units by mouth every morning.    Continuous Blood Gluc Receiver (DEXCOM G7 RECEIVER) DEVI Use to check blood sugar. E11.65    Continuous Blood Gluc Sensor (DEXCOM G7 SENSOR) MISC 1 Device by Does not apply route as directed.    furosemide (LASIX) 40 MG tablet TAKE 1 TABLET BY MOUTH TWICE A DAY (Patient taking differently: Take 40 mg by mouth See admin instructions. Take one tablet (40 mg) by mouth daily when not going out - approximately 5 times week)    gabapentin (NEURONTIN) 100 MG capsule Take 100 mg by mouth 2 (two) times daily. Take with a 300 mg capsule for a total dose of 400 mg twice daily    gabapentin (NEURONTIN) 300 MG capsule Take 300 mg by mouth 2 (two) times daily. Take with a 100 mg capsule for a total dose of 400 mg twice daily    glucose blood (CONTOUR NEXT TEST) test strip Use to test blood sugar 2 times daily    glucose blood test strip Use as instructed    hydroxychloroquine (PLAQUENIL) 200 MG tablet Take 200 mg by mouth.    Insulin Pen Needle 31G X 8 MM MISC 1 Device by Does not apply route 3 (three) times daily.    insulin regular human CONCENTRATED (HUMULIN R U-500 KWIKPEN) 500 UNIT/ML KwikPen Inject 60 Units into the skin daily before breakfast AND 36 Units daily before lunch AND 36 Units daily before supper. 90 units with breakfast, 90 units with lunch, and 80 units with supper.    levothyroxine (SYNTHROID) 75 MCG tablet Take 1 tablet (75 mcg total) by mouth daily.    lidocaine (XYLOCAINE) 5 % ointment Apply 1 application topically 2 (two) times daily as needed (pain).    metFORMIN (GLUCOPHAGE) 1000 MG tablet Take 1 tablet (1,000 mg total) by mouth daily with breakfast.    methocarbamol (ROBAXIN) 750 MG tablet Take 1 tablet (750 mg total) by mouth 2 (two) times daily. (Patient taking differently: Take 750 mg by mouth every morning.)    Multiple Vitamin (MULTIVITAMIN WITH MINERALS) TABS tablet Take 1 tablet by mouth daily.     Multiple Vitamins-Minerals (AIRBORNE GUMMIES) CHEW Chew 3 tablets by mouth every morning.    ondansetron (ZOFRAN) 4 MG tablet Take 1 tablet (4 mg total) by mouth as directed. Take one Zofran pill 30-60 minutes before each colonoscopy prep dose 10/04/2020: Before procedure    pramipexole (MIRAPEX) 0.5 MG tablet TAKE 1 TABLET BY MOUTH EVERYDAY AT BEDTIME    sertraline (ZOLOFT) 100 MG tablet Take 2 tablets (200 mg total) by mouth at bedtime.    tirzepatide [facility]) 5 MG/0.5ML Pen Inject 5 mg into the skin once a week.    XARELTO 20 MG TABS tablet TAKE 1 TABLET BY MOUTH EVERY DAY    No facility-administered encounter medications on file as of 04/11/2022.  Fill History:   Dispensed Days Supply Quantity Provider Pharmacy  ALBUTEROL HFA 90 MCG INHALER 04/29/2021 25 8.5 g      Dispensed Days Supply Quantity Provider Pharmacy  ALBUTEROL HFA 90 MCG INHALER 04/29/2021 25 8.5 g      Dispensed Days Supply Quantity Provider Pharmacy  BUPRENORPHINE 20 MCG/HR PATCH 04/08/2022 28 4 each  Dispensed Days Supply Quantity Provider Pharmacy  CEPHALEXIN 500 MG CAPSULE 07/26/2020 10 30 each      Dispensed Days Supply Quantity Provider Pharmacy  FUROSEMIDE 40 MG TABLET 05/17/2020 90 180 each      Dispensed Days Supply Quantity Provider Pharmacy  GABAPENTIN 400 MG CAPSULE 01/21/2022 30 90 each      Dispensed Days Supply Quantity Provider Pharmacy  HYDROXYCHLOROQUINE 200 MG TAB 02/17/2022 90 180 each      Dispensed Days Supply Quantity Provider Pharmacy  HUMULIN R 500 UNIT/ML KWIKPEN 03/16/2022 30 12 mL      Dispensed Days Supply Quantity Provider Pharmacy  LEVOTHYROXINE 75 MCG TABLET 02/09/2022 90 90 each      Dispensed Days Supply Quantity Provider Pharmacy  LIDOCAINE 5% OINTMENT 01/21/2022 30 50 g      Dispensed Days Supply Quantity Provider Pharmacy  METFORMIN HCL 1,000 MG TABLET 03/19/2022 90 180 each      Dispensed Days Supply Quantity Provider Pharmacy  METHOCARBAMOL  750 MG TABS  02/27/2022 30 90 tablet      Dispensed Days Supply Quantity Provider Pharmacy  ONDANSETRON HCL 4 MG TABLET 09/29/2020 1 2 each      Dispensed Days Supply Quantity Provider Pharmacy  PRAMIPEXOLE 0.5 MG TABLET 02/09/2022 90 90 each      Dispensed Days Supply Quantity Provider Pharmacy  SERTRALINE HCL 100 MG TABLET 02/19/2022 90 180 each      Dispensed Days Supply Quantity Provider Pharmacy  MOUNJARO 5 MG/0.5 ML PEN 03/19/2022 84 6 mL      Recent vitals BP Readings from Last 3 Encounters:  03/24/22 127/83  03/19/22 138/69  03/16/22 120/78   Pulse Readings from Last 3 Encounters:  03/24/22 76  03/19/22 72  03/16/22 78   Wt Readings from Last 3 Encounters:  03/16/22 (!) 429 lb (194.6 kg)  02/19/22 (!) 432 lb (196 kg)  12/08/21 (!) 438 lb (198.7 kg)   BMI Readings from Last 3 Encounters:  03/16/22 67.19 kg/m  02/19/22 67.66 kg/m  12/08/21 68.60 kg/m    Recent lab results    Component Value Date/Time   NA 138 03/24/2022 1537   NA 142 06/25/2013 1037   K 4.7 03/24/2022 1537   CL 100 03/24/2022 1537   CO2 30 03/24/2022 1537   GLUCOSE 241 (H) 03/24/2022 1537   BUN 17 03/24/2022 1537   BUN 14 06/25/2013 1037   CREATININE 0.89 03/24/2022 1537   CREATININE 0.92 02/19/2020 1611   CALCIUM 9.9 03/24/2022 1537    Lab Results  Component Value Date   CREATININE 0.89 03/24/2022   GFR 56.49 (L) 11/05/2018   GFRNONAA >60 03/24/2022   GFRAA >60 04/09/2019   Lab Results  Component Value Date/Time   HGBA1C 7.2 (A) 03/16/2022 12:27 PM   HGBA1C 8.8 (A) 11/08/2021 11:25 AM   HGBA1C 10.6 (H) 09/20/2020 07:24 AM   HGBA1C 9.2 (H) 02/19/2020 04:11 PM   FRUCTOSAMINE 327 (H) 06/21/2015 10:40 AM   MICROALBUR 15.7 (H) 11/05/2018 04:11 PM   MICROALBUR 1.5 04/14/2009 11:39 AM    Lab Results  Component Value Date   CHOL 162 05/19/2020   HDL 50.70 05/19/2020   LDLCALC 89 05/19/2020   LDLDIRECT 151.0 11/05/2018   TRIG 108.0 05/19/2020   CHOLHDL 3 05/19/2020    Care Gaps: AWV  - completed 09/04/2021 Last eye exam - never done Last foot exam - 03/16/2022 Next appointment - 07/10/2022 Hep C screen - never done Tdap - never done Shingrix - never done Pap  smear - never done Urine ACR - overdue Covid - overdue   Star Rating Drugs: Atorvastatin 10 mg - last filled [facility] Metformin 1000 mg - last filled 03/19/2022 90 DS at [place] Pharmacist Assistant [phone]

## 2022-04-28 ENCOUNTER — Other Ambulatory Visit: Payer: Self-pay | Admitting: Internal Medicine

## 2022-04-30 ENCOUNTER — Other Ambulatory Visit: Payer: Self-pay | Admitting: Family Medicine

## 2022-05-02 ENCOUNTER — Encounter: Payer: Self-pay | Admitting: Family Medicine

## 2022-05-02 ENCOUNTER — Other Ambulatory Visit: Payer: Self-pay

## 2022-05-02 ENCOUNTER — Encounter: Payer: Self-pay | Admitting: Internal Medicine

## 2022-05-02 ENCOUNTER — Other Ambulatory Visit: Payer: Self-pay | Admitting: Family Medicine

## 2022-05-02 MED ORDER — PRAMIPEXOLE DIHYDROCHLORIDE 0.5 MG PO TABS: ORAL_TABLET | ORAL | 0 refills | Status: DC

## 2022-05-02 MED ORDER — HYDROXYCHLOROQUINE SULFATE 200 MG PO TABS: 200.0000 mg | ORAL_TABLET | Freq: Every day | ORAL | 0 refills | Status: AC

## 2022-05-02 MED ORDER — SERTRALINE HCL 100 MG PO TABS: 200.0000 mg | ORAL_TABLET | Freq: Every day | ORAL | 0 refills | Status: DC

## 2022-05-02 MED ORDER — RIVAROXABAN 20 MG PO TABS: 20.0000 mg | ORAL_TABLET | Freq: Every day | ORAL | 3 refills | Status: DC

## 2022-05-02 MED ORDER — LEVOTHYROXINE SODIUM 75 MCG PO TABS: 75.0000 ug | ORAL_TABLET | Freq: Every day | ORAL | 0 refills | Status: DC

## 2022-05-03 MED ORDER — HUMULIN R U-500 KWIKPEN 500 UNIT/ML ~~LOC~~ SOPN: PEN_INJECTOR | SUBCUTANEOUS | 11 refills | Status: DC

## 2022-05-09 ENCOUNTER — Telehealth: Payer: Self-pay

## 2022-05-09 NOTE — Progress Notes (Unsigned)
Care Management & Coordination Services Pharmacy Team  Reason for Encounter: Gaps for eye exam  No history of an eye exam.  Spoke with patient    Inetta Fermo [facility]  Clinical Pharmacist Assistant [phone]

## 2022-05-15 DIAGNOSIS — Z9889 Other specified postprocedural states: Secondary | ICD-10-CM | POA: Diagnosis not present

## 2022-05-15 DIAGNOSIS — Z8639 Personal history of other endocrine, nutritional and metabolic disease: Secondary | ICD-10-CM | POA: Diagnosis not present

## 2022-05-15 DIAGNOSIS — S81812A Laceration without foreign body, left lower leg, initial encounter: Secondary | ICD-10-CM | POA: Diagnosis not present

## 2022-05-15 DIAGNOSIS — Z86711 Personal history of pulmonary embolism: Secondary | ICD-10-CM | POA: Diagnosis not present

## 2022-05-15 DIAGNOSIS — Z8739 Personal history of other diseases of the musculoskeletal system and connective tissue: Secondary | ICD-10-CM | POA: Diagnosis not present

## 2022-05-15 DIAGNOSIS — Z7901 Long term (current) use of anticoagulants: Secondary | ICD-10-CM | POA: Diagnosis not present

## 2022-05-21 ENCOUNTER — Telehealth: Payer: Self-pay

## 2022-05-21 NOTE — Progress Notes (Signed)
Care Management & Coordination Services Pharmacy Team  Reason for Encounter: Diabetes  Contacted patient to discuss diabetes disease state. Unsuccessful outreach. Left voicemail for patient to return call. Multiple calls  Current antihyperglycemic regimen:  Humulin R U-500 90 units Inject 60 Units before breakfast, 36 Units before lunch, 36 Units before supper.   Metformin 1000mg  before breakfast  Mounjaro 5 mg once weekly  Patient verbally confirms she is taking the above medications as directed.   What diet changes have been made to improve diabetes control?  What recent interventions/DTPs have been made to improve glycemic control:  No recent interventions  Have there been any recent hospitalizations or ED visits since last visit with PharmD? Patient was seen at [facility] [place] on 03/24/2022 (4 hours) due to Cellulitis of left leg and hematoma of left lower leg.        Patient was seen at [facility] ED [place] on 03/18/2022 due to Fall in home.  Patient  hypoglycemic symptoms, including   Patient  hyperglycemic symptoms, including   How often are you checking your blood sugar?   What are your blood sugars ranging?  Fasting:  Before meals:  After meals:  Bedtime:   During the week, how often does your blood glucose drop below 70?   Are you checking your feet daily/regularly?   Adherence Review: Is the patient currently on a STATIN medication? Yes Is the patient currently on ACE/ARB medication? Yes Does the patient have >5 day gap between last estimated fill dates? No  Care Gaps: AWV - completed 09/04/2021 Last eye exam - never done Last foot exam - 03/16/2022 Last BP - 120/78 on 03/16/2022 Last A1C - 7.2 on 03/16/2022 Next appointment - 07/10/2022 Hep C screen - never done Tdap - never done Shingrix - never done Pap smear - never done Urine ACR - overdue Covid - overdue   Star Rating Drugs: Atorvastatin 10 mg - last [facility] Metformin 1000 mg - last filled [facility]22 [facility]  Chart Updates:  Recent office visits:  None  Recent consult visits:  None  Hospital visits:  None  Medications: Outpatient Encounter Medications as of 05/21/2022  Medication Sig Note   albuterol (PROVENTIL) (2.5 MG/3ML) 0.083% nebulizer solution 1 VIAL IN NEBULIZER EVERY 4 HOURS AS NEEDED FOR WHEEZING    albuterol (VENTOLIN HFA) 108 (90 Base) MCG/ACT inhaler Inhale 2 puffs into the lungs every 4 (four) hours as needed for wheezing or shortness of breath.    atorvastatin (LIPITOR) 10 MG tablet TAKE 1 TABLET BY MOUTH EVERY DAY    Blood Glucose Monitoring Suppl (CONTOUR NEXT MONITOR) w/Device KIT 1 each by Does not apply route 2 (two) times daily. To check blood sugars twice a day.    Buprenorphine 15 MCG/HR PTWK Place 1 patch onto the skin every Monday. Uses 20 mcg at this time 09/19/2020: #4/28 days [facility] - current patch is on upper right arm   cefadroxil (DURICEF) 500 MG capsule Take 1 capsule (500 mg total) by mouth 2 (two) times daily. Take at first signs of recurrent cellulitis (Patient taking differently: Take 500 mg by mouth as needed. Take at first signs of recurrent cellulitis)    Cholecalciferol (VITAMIN D3) 50 MCG (2000 UT) TABS Take 2,000 Units by mouth every morning.    Continuous Blood Gluc Receiver (DEXCOM G7 RECEIVER) DEVI Use to check blood sugar. E11.65    Continuous Blood Gluc Sensor (  DEXCOM G7 SENSOR) MISC 1 Device by Does not apply route as directed.    furosemide (LASIX) 40 MG tablet TAKE 1 TABLET BY MOUTH TWICE A DAY (Patient taking differently: Take 40 mg by mouth See admin instructions. Take one tablet (40 mg) by mouth daily when not going out - approximately 5 times week)    gabapentin (NEURONTIN) 100 MG capsule Take 100 mg by mouth 2 (two) times daily. Take with a 300 mg capsule for a total dose of 400 mg twice daily    gabapentin (NEURONTIN) 300 MG capsule Take 300 mg by mouth  2 (two) times daily. Take with a 100 mg capsule for a total dose of 400 mg twice daily    glucose blood (CONTOUR NEXT TEST) test strip Use to test blood sugar 2 times daily    glucose blood test strip Use as instructed    hydroxychloroquine (PLAQUENIL) 200 MG tablet Take 1 tablet (200 mg total) by mouth daily.    Insulin Pen Needle 31G X 8 MM MISC 1 Device by Does not apply route 3 (three) times daily.    insulin regular human CONCENTRATED (HUMULIN R U-500 KWIKPEN) 500 UNIT/ML KwikPen Max daily 200 units    levothyroxine (SYNTHROID) 75 MCG tablet Take 1 tablet (75 mcg total) by mouth daily.    lidocaine (XYLOCAINE) 5 % ointment Apply 1 application topically 2 (two) times daily as needed (pain).    metFORMIN (GLUCOPHAGE) 1000 MG tablet Take 1 tablet (1,000 mg total) by mouth daily with breakfast.    methocarbamol (ROBAXIN) 750 MG tablet Take 1 tablet (750 mg total) by mouth 2 (two) times daily. (Patient taking differently: Take 750 mg by mouth every morning.)    Multiple Vitamin (MULTIVITAMIN WITH MINERALS) TABS tablet Take 1 tablet by mouth daily.    Multiple Vitamins-Minerals (AIRBORNE GUMMIES) CHEW Chew 3 tablets by mouth every morning.    ondansetron (ZOFRAN) 4 MG tablet Take 1 tablet (4 mg total) by mouth as directed. Take one Zofran pill 30-60 minutes before each colonoscopy prep dose 10/04/2020: Before procedure    pramipexole (MIRAPEX) 0.5 MG tablet TAKE 1 TABLET BY MOUTH EVERYDAY AT BEDTIME    rivaroxaban (XARELTO) 20 MG TABS tablet Take 1 tablet (20 mg total) by mouth daily.    sertraline (ZOLOFT) 100 MG tablet Take 2 tablets (200 mg total) by mouth at bedtime.    tirzepatide [facility]) 5 MG/0.5ML Pen Inject 5 mg into the skin once a week.    No facility-administered encounter medications on file as of 05/21/2022.  Fill History:  ALBUTEROL HFA 90 MCG INHALER 04/29/2021 25 8.5 g    Dispensed Days Supply Quantity Provider Pharmacy  ATORVASTATIN 10 MG TABLET 03/21/2022 90 90 each       Dispensed Days Supply Quantity Provider Pharmacy  BUPRENORPHINE 20 MCG/HR PATCH 05/04/2022 28 4 each      Dispensed Days Supply Quantity Provider Pharmacy  CEFADROXIL 500 MG CAPSULE 05/26/2021 30 60 each      Dispensed Days Supply Quantity Provider Pharmacy  FUROSEMIDE 40 MG TABLET 05/17/2020 90 180 each      Dispensed Days Supply Quantity Provider Pharmacy  GABAPENTIN 400 MG CAPSULE 01/21/2022 30 90 each      Dispensed Days Supply Quantity Provider Pharmacy  HYDROXYCHLOROQUINE 200 MG TAB 05/02/2022 90 90 each      Dispensed Days Supply Quantity Provider Pharmacy  HUMULIN R 500 UNIT/ML [facility] 05/03/2022 30 12 mL      Dispensed Days Supply Quantity Provider Pharmacy  LEVOTHYROXINE 75 MCG TABLET 05/02/2022 90 90 each      Dispensed Days Supply Quantity Provider Pharmacy  LIDOCAINE 5% OINTMENT 01/21/2022 30 50 g      Dispensed Days Supply Quantity Provider Pharmacy  METFORMIN HCL 1,000 MG TABLET 03/19/2022 90 180 each      Dispensed Days Supply Quantity Provider Pharmacy  METHOCARBAMOL  750 MG TABS 02/27/2022 30 90 tablet      Dispensed Days Supply Quantity Provider Pharmacy  PRAMIPEXOLE 0.5 MG TABLET 05/02/2022 90 90 each      Dispensed Days Supply Quantity Provider Pharmacy  XARELTO 20 MG TABLET 04/28/2022 30 30 each      Dispensed Days Supply Quantity Provider Pharmacy  SERTRALINE HCL 100 MG TABLET 05/03/2022 90 180 each      Dispensed Days Supply Quantity Provider Pharmacy  MOUNJARO 5 MG/0.5 ML PEN 03/19/2022 84 6 mL     Recent Relevant Labs: Lab Results  Component Value Date/Time   HGBA1C 7.2 (A) 03/16/2022 12:27 PM   HGBA1C 8.8 (A) 11/08/2021 11:25 AM   HGBA1C 10.6 (H) 09/20/2020 07:24 AM   HGBA1C 9.2 (H) 02/19/2020 04:11 PM   MICROALBUR 15.7 (H) 11/05/2018 04:11 PM   MICROALBUR 1.5 04/14/2009 11:39 AM    Kidney Function Lab Results  Component Value Date/Time   CREATININE 0.89 03/24/2022 03:37 PM   CREATININE 1.15 (H) 09/04/2021 06:08 PM   CREATININE 0.92  02/19/2020 04:11 PM   CREATININE 0.93 02/01/2012 01:17 PM   GFR 56.49 (L) 11/05/2018 04:11 PM   GFRNONAA >60 03/24/2022 03:37 PM   GFRAA >60 04/09/2019 01:27 PM    Inetta Fermo CMA  Clinical Pharmacist Assistant [phone]

## 2022-05-29 ENCOUNTER — Encounter: Payer: Self-pay | Admitting: Family Medicine

## 2022-05-29 ENCOUNTER — Ambulatory Visit (INDEPENDENT_AMBULATORY_CARE_PROVIDER_SITE_OTHER): Payer: Medicare Other | Admitting: Family Medicine

## 2022-05-29 VITALS — BP 110/70 | HR 85 | Temp 98.7°F

## 2022-05-29 DIAGNOSIS — M79642 Pain in left hand: Secondary | ICD-10-CM | POA: Diagnosis not present

## 2022-05-29 DIAGNOSIS — Z79899 Other long term (current) drug therapy: Secondary | ICD-10-CM | POA: Diagnosis not present

## 2022-05-29 DIAGNOSIS — M7918 Myalgia, other site: Secondary | ICD-10-CM | POA: Diagnosis not present

## 2022-05-29 DIAGNOSIS — M2560 Stiffness of unspecified joint, not elsewhere classified: Secondary | ICD-10-CM | POA: Diagnosis not present

## 2022-05-29 DIAGNOSIS — D649 Anemia, unspecified: Secondary | ICD-10-CM | POA: Diagnosis not present

## 2022-05-29 DIAGNOSIS — S81812D Laceration without foreign body, left lower leg, subsequent encounter: Secondary | ICD-10-CM | POA: Diagnosis not present

## 2022-05-29 DIAGNOSIS — E039 Hypothyroidism, unspecified: Secondary | ICD-10-CM | POA: Diagnosis not present

## 2022-05-29 DIAGNOSIS — M059 Rheumatoid arthritis with rheumatoid factor, unspecified: Secondary | ICD-10-CM | POA: Diagnosis not present

## 2022-05-29 DIAGNOSIS — M79641 Pain in right hand: Secondary | ICD-10-CM | POA: Diagnosis not present
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
## 2022-06-19 ENCOUNTER — Other Ambulatory Visit: Payer: Self-pay | Admitting: Internal Medicine

## 2022-06-19 DIAGNOSIS — E785 Hyperlipidemia, unspecified: Secondary | ICD-10-CM

## 2022-06-22 ENCOUNTER — Other Ambulatory Visit: Payer: Self-pay | Admitting: Internal Medicine

## 2022-07-25 ENCOUNTER — Other Ambulatory Visit: Payer: Self-pay | Admitting: Family Medicine

## 2022-07-30 ENCOUNTER — Other Ambulatory Visit: Payer: Self-pay | Admitting: Family Medicine

## 2022-07-31 ENCOUNTER — Other Ambulatory Visit: Payer: Self-pay | Admitting: Family Medicine

## 2022-08-04 ENCOUNTER — Other Ambulatory Visit: Payer: Self-pay | Admitting: Internal Medicine

## 2022-08-23 ENCOUNTER — Encounter: Payer: Medicare Other | Admitting: Family Medicine

## 2022-08-23 NOTE — Progress Notes (Signed)
Patient having phone and tech issues. She wanted to reschedule per staff checking her in.

## 2022-08-28 DIAGNOSIS — M7918 Myalgia, other site: Secondary | ICD-10-CM | POA: Diagnosis not present

## 2022-08-28 DIAGNOSIS — Z79899 Other long term (current) drug therapy: Secondary | ICD-10-CM | POA: Diagnosis not present

## 2022-09-06 ENCOUNTER — Encounter: Payer: Self-pay | Admitting: Family Medicine

## 2022-09-07 MED ORDER — ALBUTEROL SULFATE HFA 108 (90 BASE) MCG/ACT IN AERS: 2.0000 | INHALATION_SPRAY | RESPIRATORY_TRACT | 1 refills | Status: DC | PRN

## 2022-09-07 MED ORDER — AMOXICILLIN-POT CLAVULANATE 875-125 MG PO TABS: 1.0000 | ORAL_TABLET | Freq: Two times a day (BID) | ORAL | 0 refills | Status: AC

## 2022-09-07 NOTE — Telephone Encounter (Signed)
Call in Augmentin 875 BID for 10 days  

## 2022-09-07 NOTE — Telephone Encounter (Signed)
Rx sent and pt notified.

## 2022-09-10 MED ORDER — ALBUTEROL SULFATE (2.5 MG/3ML) 0.083% IN NEBU: 2.5000 mg | INHALATION_SOLUTION | RESPIRATORY_TRACT | 1 refills | Status: AC | PRN

## 2022-09-10 NOTE — Telephone Encounter (Signed)
Rx sent to pt pharmacy, pt notified 

## 2022-09-19 ENCOUNTER — Other Ambulatory Visit: Payer: Self-pay | Admitting: Internal Medicine

## 2022-09-19 DIAGNOSIS — E785 Hyperlipidemia, unspecified: Secondary | ICD-10-CM

## 2022-09-19 NOTE — Telephone Encounter (Signed)
Call in Doxycycline 100 mg BID for 10 days  

## 2022-09-20 MED ORDER — DOXYCYCLINE HYCLATE 100 MG PO TABS: 100.0000 mg | ORAL_TABLET | Freq: Two times a day (BID) | ORAL | 0 refills | Status: DC

## 2022-09-23 ENCOUNTER — Other Ambulatory Visit: Payer: Self-pay | Admitting: Family Medicine

## 2022-09-25 ENCOUNTER — Encounter: Payer: Self-pay | Admitting: Internal Medicine

## 2022-09-25 ENCOUNTER — Telehealth (INDEPENDENT_AMBULATORY_CARE_PROVIDER_SITE_OTHER): Payer: Medicare Other | Admitting: Internal Medicine

## 2022-09-25 ENCOUNTER — Telehealth: Payer: Self-pay | Admitting: Internal Medicine

## 2022-09-25 VITALS — Ht 67.0 in

## 2022-09-25 DIAGNOSIS — Z7985 Long-term (current) use of injectable non-insulin antidiabetic drugs: Secondary | ICD-10-CM | POA: Diagnosis not present

## 2022-09-25 DIAGNOSIS — E1142 Type 2 diabetes mellitus with diabetic polyneuropathy: Secondary | ICD-10-CM

## 2022-09-25 DIAGNOSIS — E785 Hyperlipidemia, unspecified: Secondary | ICD-10-CM | POA: Diagnosis not present

## 2022-09-25 DIAGNOSIS — Z794 Long term (current) use of insulin: Secondary | ICD-10-CM | POA: Diagnosis not present

## 2022-09-25 DIAGNOSIS — Z7984 Long term (current) use of oral hypoglycemic drugs: Secondary | ICD-10-CM | POA: Insufficient documentation

## 2022-09-25 DIAGNOSIS — E1165 Type 2 diabetes mellitus with hyperglycemia: Secondary | ICD-10-CM | POA: Diagnosis not present

## 2022-09-25 MED ORDER — TIRZEPATIDE 7.5 MG/0.5ML ~~LOC~~ SOAJ: 7.5000 mg | SUBCUTANEOUS | 3 refills | Status: DC

## 2022-09-25 NOTE — Telephone Encounter (Signed)
Please schedule the pt to see me in 4 months   Thanks

## 2022-09-25 NOTE — Progress Notes (Signed)
Virtual Visit via Video Note  I connected with Cheyenne Guzman on 09/25/22  at 3 pm PM by a video enabled telemedicine application and verified that I am speaking with the correct person using two identifiers.   I discussed the limitations of evaluation and management by telemedicine and the availability of in person appointments. The patient expressed understanding and agreed to proceed.   -Location of the patient : home  -Location of the provider : Office -The names of all persons participating in the telemedicine service : Pt and myself        Name: Cheyenne Guzman  Age/ Sex: 57 y.o., female   MRN/ DOB: [id number], Mar 22, 1965     PCP: Nelwyn Salisbury, MD   Reason for Endocrinology Evaluation: Type 2 Diabetes Mellitus  Initial Endocrine Consultative Visit: 08/06/2016    PATIENT IDENTIFIER: Cheyenne Guzman is a 57 y.o. female with a past medical history of T2DM, HTN, OSA. The patient has followed with Endocrinology clinic since 08/06/2016 for consultative assistance with management of her diabetes.  DIABETIC HISTORY:  Ms. Radle was diagnosed with T2DM in 2000. Has been on Metformin without side effects  .Has been on insulin since 2009. Her hemoglobin A1c has ranged from 8.2% in 2017, peaking at 10.3% in 2019.  Re-established with me 08/2018. Stopped lantus and started Novolog mix as well as metformin.   Attempted to prescribe Ozempic in 2021 but it was cost prohibitive  By September 2022 we will switch Humalog mix to Humulin U-500   Started Mounjaro 10/2021 SUBJECTIVE:    Today (09/25/2022): [name] is here for a follow up on diabetes management . She checks her blood sugars multiple daily . She has not had any hypoglycemic episodes.   She is currently suffering from COVID infection with headaches, congestion and cough  She has noted hair loss, daughter had pituitary sx  She required glucocorticoids injection + oral for groin fall after a fall  Denies nausea or vomiting   Denies  diarrhea      HOME REGIMEN:  Metformin 1000 mg, with breakfast  Mounjaro 5 mg weekly ( Saturday )  Humulin U-500 , 60 units with breakfast, 36 units with lunch, and 36 units with supper Lipitor 10 mg daily    Statin: yes ACE-I/ARB: no Prior Diabetic Education: Yes   CONTINUOUS GLUCOSE MONITORING RECORD INTERPRETATION    Dates of Recording: 8/21-09/25/2022  Sensor description: Dexcom  Results statistics:   CGM use % of time 89  Average and SD 182/37  Time in range   49   %  % Time Above 180 49  % Time above 250 2  % Time Below target 0   Glycemic patterns summary: BG's are optimal most nights and fluctuate during the day  Hyperglycemic episodes postprandial  Hypoglycemic episodes occurred n/a  Overnight periods: stable    DIABETIC COMPLICATIONS: Microvascular complications:  Neuropathy, ? Mild retinopathy  Denies: CKD Last Eye Exam: Completed 11/2020  Macrovascular complications:  TIA Denies: CAD, CVA, PVD   HISTORY:  Past Medical History:  Past Medical History:  Diagnosis Date   Anxiety    per pt. - mild    Arthritis    back & ankles    Asthma    seasonal    Breast cancer (HCC)    Burning sensation of mouth 10/07/2020   Cancer (HCC)    breast, sees Dr. Darnelle Catalan , Left - 11/2007   Candida vaginitis 10/07/2020   Cellulitis 2020  severe - Left leg    Complication of anesthesia 2013   severe muscle spasms-sore-no doc   Depression    Diabetes mellitus    sees Dr. Talmage Nap, diagnosed 2003   HA (headache)    Hearing loss    Went to [facility],Dr Laing,both ears   Hyperlipidemia    Kidney infection    - hosp. [facility]- septic- 10/2009   Low back pain    Muscle tear 10/07/2020   Peripheral neuropathy    R sided numbness- face & jaw   Pulmonary embolism (HCC) 2020   Sepsis (HCC) 2020   Sleep apnea 09/2013   severe-just started cpap-doing well   Sore throat 10/07/2020   TIA (transient ischemic attack)    in 2007,2008,  and 2011   Tinnitus of right ear    Urosepsis 08/2009   with Klebsiella   Past Surgical History:  Past Surgical History:  Procedure Laterality Date   ABDOMINAL HYSTERECTOMY  08/2004   BREAST BIOPSY  06/22/2011   Procedure: BREAST BIOPSY;  Surgeon: Almond Lint, MD;  [facility]: [facility];  Service: General;  Laterality: Right;   BREAST LUMPECTOMY     BREAST SURGERY  11/2007   left ductal carcinoma in situ   CARPAL TUNNEL RELEASE Right 10/13/2013   Procedure: RIGHT CARPAL TUNNEL RELEASE;  Surgeon: Velna Ochs, MD;  Location: [facility];  Service: Orthopedics;  Laterality: Right;   CARPAL TUNNEL RELEASE Left 11/24/2013   Procedure: LEFT CARPAL TUNNEL RELEASE WITH CYST EXCISION ;  Surgeon: Velna Ochs, MD;  Location: [facility];  Service: Orthopedics;  Laterality: Left;   COLONOSCOPY WITH PROPOFOL N/A 10/10/2020   Procedure: COLONOSCOPY WITH PROPOFOL;  Surgeon: Benancio Deeds, MD;  Location: [facility];  Service: Gastroenterology;  Laterality: N/A;   EAR CYST EXCISION Left 11/24/2013   Procedure: CYST REMOVAL;  Surgeon: Velna Ochs, MD;  Location: [facility];  Service: Orthopedics;  Laterality: Left;   FOOT SURGERY  2000   plantar fasciitis-left   LUMBAR DISC SURGERY  03-29-14   Fusion, revision 04-27-14 L4-5 Cauda Equina with graft   NERVE GRAFT  06-01-08   cadaver graft to right inferior alveolar nerve  in [place]   POLYPECTOMY  10/10/2020   Procedure: POLYPECTOMY;  Surgeon: Benancio Deeds, MD;  Location: [facility];  Service: Gastroenterology;;   Social History:  reports that she quit smoking about 30 years ago. Her smoking use included cigarettes. She has never used smokeless tobacco. She reports that she does not currently use alcohol. She reports that she does not use drugs. Family History:  Family History  Problem Relation Age of Onset   Cancer Mother        breast   Cancer Father        lung   Diabetes Father     Colon cancer Sister    Cancer Sister 47       colon   Diabetes Sister    Cancer Maternal Aunt        breast - both   Anesthesia problems Neg Hx      HOME MEDICATIONS: Allergies as of 09/25/2022       Reactions   Vicodin [hydrocodone-acetaminophen] Nausea And Vomiting        Medication List        Accurate as of September 25, 2022  9:16 AM. If you have any questions, ask your nurse or doctor.  Airborne Gummies [employer] 3 tablets by mouth every morning.   albuterol 108 (90 Base) MCG/ACT inhaler Commonly known as: VENTOLIN HFA Inhale 2 puffs into the lungs every 4 (four) hours as needed for wheezing or shortness of breath.   albuterol (2.5 MG/3ML) 0.083% nebulizer solution Commonly known as: PROVENTIL Take 3 mLs (2.5 mg total) by nebulization every 4 (four) hours as needed for wheezing or shortness of breath.   atorvastatin 10 MG tablet Commonly known as: LIPITOR TAKE 1 TABLET BY MOUTH EVERY DAY   buprenorphine 15 MCG/HR Commonly known as: BUTRANS Place 1 patch onto the skin every Monday. Uses 20 mcg at this time   cefadroxil 500 MG capsule Commonly known as: DURICEF Take 1 capsule (500 mg total) by mouth 2 (two) times daily. Take at first signs of recurrent cellulitis What changed:  when to take this reasons to take this   Contour Next Monitor w/Device Kit 1 each by Does not apply route 2 (two) times daily. To check blood sugars twice a day.   Dexcom G7 Receiver Devi Use to check blood sugar. E11.65   Dexcom G7 Sensor Misc Change sensors every 10 days   doxycycline 100 MG tablet Commonly known as: VIBRA-TABS Take 1 tablet (100 mg total) by mouth 2 (two) times daily.   furosemide 40 MG tablet Commonly known as: LASIX TAKE 1 TABLET BY MOUTH TWICE A DAY What changed:  when to take this additional instructions   gabapentin 300 MG capsule Commonly known as: NEURONTIN Take 300 mg by mouth 2 (two) times daily. Take with a 100 mg capsule for  a total dose of 400 mg twice daily   gabapentin 100 MG capsule Commonly known as: NEURONTIN Take 100 mg by mouth 2 (two) times daily. Take with a 300 mg capsule for a total dose of 400 mg twice daily   glucose blood test strip Use as instructed   glucose blood test strip Commonly known as: Contour Next Test Use to test blood sugar 2 times daily   HumuLIN R U-500 KwikPen 500 UNIT/ML KwikPen Generic drug: insulin regular human CONCENTRATED Max daily 200 units   hydroxychloroquine 200 MG tablet Commonly known as: PLAQUENIL Take 1 tablet (200 mg total) by mouth daily.   Insulin Pen Needle 31G X 8 MM Misc 1 Device by Does not apply route 3 (three) times daily.   levothyroxine 75 MCG tablet Commonly known as: SYNTHROID TAKE 1 TABLET BY MOUTH EVERY DAY   lidocaine 5 % ointment Commonly known as: XYLOCAINE Apply 1 application topically 2 (two) times daily as needed (pain).   metFORMIN 1000 MG tablet Commonly known as: GLUCOPHAGE Take 1 tablet (1,000 mg total) by mouth daily with breakfast.   methocarbamol 750 MG tablet Commonly known as: ROBAXIN Take 1 tablet (750 mg total) by mouth 2 (two) times daily. What changed: when to take this   multivitamin with minerals Tabs tablet Take 1 tablet by mouth daily.   ondansetron 4 MG tablet Commonly known as: ZOFRAN Take 1 tablet (4 mg total) by mouth as directed. Take one Zofran pill 30-60 minutes before each colonoscopy prep dose   pramipexole 0.5 MG tablet Commonly known as: MIRAPEX TAKE 1 TABLET BY MOUTH EVERYDAY AT BEDTIME   rivaroxaban 20 MG Tabs tablet Commonly known as: Xarelto Take 1 tablet (20 mg total) by mouth daily.   sertraline 100 MG tablet Commonly known as: ZOLOFT TAKE 2 TABLETS BY MOUTH AT BEDTIME.   tirzepatide 5 MG/0.5ML Pen Commonly  known as: MOUNJARO Inject 5 mg into the skin once a week.   Vitamin D3 50 MCG (2000 UT) Tabs Take 2,000 Units by mouth every morning.         OBJECTIVE:   Vital  Signs: There were no vitals taken for this visit.  Wt Readings from Last 3 Encounters:  03/16/22 (!) 429 lb (194.6 kg)  02/19/22 (!) 432 lb (196 kg)  12/08/21 (!) 438 lb (198.7 kg)     Exam: General: Pt appears well and is in NAD  Neuro: MS is good with appropriate affect, pt is alert and Ox3   DM Foot Exam 03/16/2022  The skin of the feet is intact without sores or ulcerations. The pedal pulses are 1+ on right and 1+ on left. The sensation is decreased  to a screening 5.07, 10 gram monofilament bilaterally    DATA REVIEWED:  Lab Results  Component Value Date   HGBA1C 7.2 (A) 03/16/2022   HGBA1C 8.8 (A) 11/08/2021   HGBA1C 9.6 (A) 05/11/2021    Latest Reference Range & Units 09/04/21 18:08  Sodium 135 - 145 mmol/L 138  Potassium 3.5 - 5.1 mmol/L 4.3  Chloride 98 - 111 mmol/L 101  CO2 22 - 32 mmol/L 28  Glucose 70 - 99 mg/dL 829 (H)  BUN 6 - 20 mg/dL 16  Creatinine 5.62 - 1.30 mg/dL 8.65 (H)  Calcium 8.9 - 10.3 mg/dL 9.1  Anion gap 5 - 15  9  GFR, Estimated >60 mL/min 56 (L)    ASSESSMENT / PLAN / RECOMMENDATIONS:   1) Type 2 Diabetes Mellitus, Optimally  controlled, With retinopathic and neuropathic complications - Most recent A1c of 7.2 %. Goal A1c < 7.0 %.   -This was not a virtual visit and unable to obtain A1c -BG average on Dexcom download 182 Mg/DL -She is not having GI side effects to [facility], she does endorse hair loss that she read online could be caused by [employer]. -She does not feel that the 5 mg of Mounjaro has worked as well and suppressing appetite, will increase the dose as below  MEDICATIONS:  -Continue Humulin U-500 at 60 units before breakfast, 36 units before lunch and 36 units before supper - Continue Metformin 1000 mg daily beakfast  -Increase Mounjaro 7.5 mg weekly   EDUCATION / INSTRUCTIONS: BG monitoring instructions: Patient is instructed to check her blood sugars 3 times a day, before each meal Call [facility] if:  BG persistently < 70  I reviewed the Rule of 15 for the treatment of hypoglycemia in detail with the patient. Literature supplied.    2) Diabetic complications:  Eye: Does have known diabetic retinopathy. Per pt (mild) Neuro/ Feet: Does have known diabetic peripheral neuropathy .  Renal: Patient does not have known baseline CKD. She   is not on an ACEI/ARB at present.   3) Dyslipidemia:   -Her LDL has been fluctuating - Will continue to monitor   Medication Continue atorvastatin 10 mg daily   4) Hair Loss :  -Patient on a Facebook group, and attributes hair loss to [employer] intake, unclear data at this time.  We also discussed stress as a contributing factor -Patient to start OTC hair, skin, nail vitamins daily  F/U in 4 months    Signed electronically by: Lyndle Herrlich, MD  [facility]  [facility] [address] [place] [place], [place] [address] Phone: [phone] FAX: [phone]   CC: Nelwyn Salisbury, MD [phone] [place] [address] Phone: [phone]  Fax: [phone]  Return to Endocrinology clinic as below: Future Appointments  Date Time Provider Department Center  09/25/2022  3:00 PM Takuma Cifelli, Konrad Dolores, MD [facility] None

## 2022-09-26 ENCOUNTER — Encounter: Payer: Self-pay | Admitting: Internal Medicine

## 2022-09-26 NOTE — Telephone Encounter (Signed)
Pt LOV was on 05/29/2022 Last refill was done on 05/02/22 Please advise if ok to send refill

## 2022-09-27 ENCOUNTER — Encounter: Payer: Self-pay | Admitting: Family Medicine

## 2022-10-02 MED ORDER — FLUCONAZOLE 150 MG PO TABS: 150.0000 mg | ORAL_TABLET | Freq: Every day | ORAL | 3 refills | Status: DC | PRN

## 2022-10-02 NOTE — Telephone Encounter (Signed)
Call in Diflucan 150 mg daily as needed, #10 with 3 rf

## 2022-10-12 ENCOUNTER — Encounter: Payer: Self-pay | Admitting: Family Medicine

## 2022-10-12 ENCOUNTER — Telehealth (INDEPENDENT_AMBULATORY_CARE_PROVIDER_SITE_OTHER): Payer: Medicare Other | Admitting: Family Medicine

## 2022-10-12 VITALS — Temp 100.4°F

## 2022-10-12 DIAGNOSIS — N390 Urinary tract infection, site not specified: Secondary | ICD-10-CM | POA: Diagnosis not present

## 2022-10-12 MED ORDER — CIPROFLOXACIN HCL 500 MG PO TABS: 500.0000 mg | ORAL_TABLET | Freq: Two times a day (BID) | ORAL | 0 refills | Status: DC

## 2022-10-12 NOTE — Progress Notes (Signed)
Subjective:    Patient ID: Cheyenne Guzman, female    DOB: Oct 13, 1965, 57 y.o.   MRN: [id number]  HPI Virtual Visit via Video Note  I connected with the patient on 10/12/22 at  8:45 AM EDT by a video enabled telemedicine application and verified that I am speaking with the correct person using two identifiers.  Location patient: home Location provider:work or home office Persons participating in the virtual visit: patient, provider  I discussed the limitations of evaluation and management by telemedicine and the availability of in person appointments. The patient expressed understanding and agreed to proceed.   HPI: Here for 3 days of fever to 100.4 degrees, urgency to urinate, burning, and cloudy urine. Drinking water and cranberry juice.    ROS: See pertinent positives and negatives per HPI.  Past Medical History:  Diagnosis Date   Anxiety    per pt. - mild    Arthritis    back & ankles    Asthma    seasonal    Breast cancer (HCC)    Burning sensation of mouth 10/07/2020   Cancer (HCC)    breast, sees Dr. Darnelle Catalan , Left - 11/2007   Candida vaginitis 10/07/2020   Cellulitis 2020   severe - Left leg    Complication of anesthesia 2013   severe muscle spasms-sore-no doc   Depression    Diabetes mellitus    sees Dr. Talmage Nap, diagnosed 2003   HA (headache)    Hearing loss    Went to [facility],Dr Laing,both ears   Hyperlipidemia    Kidney infection    - hosp. [facility]- septic- 10/2009   Low back pain    Muscle tear 10/07/2020   Peripheral neuropathy    R sided numbness- face & jaw   Pulmonary embolism (HCC) 2020   Sepsis (HCC) 2020   Sleep apnea 09/2013   severe-just started cpap-doing well   Sore throat 10/07/2020   TIA (transient ischemic attack)    in 2007,2008, and 2011   Tinnitus of right ear    Urosepsis 08/2009   with Klebsiella    Past Surgical History:  Procedure Laterality Date   ABDOMINAL HYSTERECTOMY  08/2004   BREAST BIOPSY   06/22/2011   Procedure: BREAST BIOPSY;  Surgeon: Almond Lint, MD;  Location: [facility];  Service: General;  Laterality: Right;   BREAST LUMPECTOMY     BREAST SURGERY  11/2007   left ductal carcinoma in situ   CARPAL TUNNEL RELEASE Right 10/13/2013   Procedure: RIGHT CARPAL TUNNEL RELEASE;  Surgeon: Velna Ochs, MD;  Location: [facility];  Service: Orthopedics;  Laterality: Right;   CARPAL TUNNEL RELEASE Left 11/24/2013   Procedure: LEFT CARPAL TUNNEL RELEASE WITH CYST EXCISION ;  Surgeon: Velna Ochs, MD;  Location: [facility];  Service: Orthopedics;  Laterality: Left;   COLONOSCOPY WITH PROPOFOL N/A 10/10/2020   Procedure: COLONOSCOPY WITH PROPOFOL;  Surgeon: Benancio Deeds, MD;  [facility];  Service: Gastroenterology;  Laterality: N/A;   EAR CYST EXCISION Left 11/24/2013   Procedure: CYST REMOVAL;  Surgeon: Velna Ochs, MD;  Location: [facility];  Service: Orthopedics;  Laterality: Left;   FOOT SURGERY  2000   plantar fasciitis-left   LUMBAR DISC SURGERY  03-29-14   Fusion, revision 04-27-14 L4-5 Cauda Equina with graft   NERVE GRAFT  06-01-08   cadaver graft to right inferior alveolar nerve  in [place]  POLYPECTOMY  10/10/2020   Procedure: POLYPECTOMY;  Surgeon: Benancio Deeds, MD;  [facility]: [facility];  Service: Gastroenterology;;    Family History  Problem Relation Age of Onset   Cancer Mother        breast   Cancer Father        lung   Diabetes Father    Colon cancer Sister    Cancer Sister 67       colon   Diabetes Sister    Cancer Maternal Aunt        breast - both   Anesthesia problems Neg Hx      Current Outpatient Medications:    albuterol (PROVENTIL) (2.5 MG/3ML) 0.083% nebulizer solution, Take 3 mLs (2.5 mg total) by nebulization every 4 (four) hours as needed for wheezing or shortness of breath., Disp: 75 mL, Rfl: 1   albuterol (VENTOLIN HFA) 108 (90 Base) MCG/ACT inhaler, Inhale 2  puffs into the lungs every 4 (four) hours as needed for wheezing or shortness of breath., Disp: 20.1 g, Rfl: 1   atorvastatin (LIPITOR) 10 MG tablet, TAKE 1 TABLET BY MOUTH EVERY DAY, Disp: 90 tablet, Rfl: 0   Blood Glucose Monitoring Suppl (CONTOUR NEXT MONITOR) w/Device KIT, 1 each by Does not apply route 2 (two) times daily. To check blood sugars twice a day., Disp: 1 kit, Rfl: 0   Buprenorphine 15 MCG/HR PTWK, Place 1 patch onto the skin every Monday. Uses 20 mcg at this time, Disp: , Rfl: 0   cefadroxil (DURICEF) 500 MG capsule, Take 1 capsule (500 mg total) by mouth 2 (two) times daily. Take at first signs of recurrent cellulitis (Patient taking differently: Take 500 mg by mouth as needed. Take at first signs of recurrent cellulitis), Disp: 120 capsule, Rfl: 6   Cholecalciferol (VITAMIN D3) 50 MCG (2000 UT) TABS, Take 2,000 Units by mouth every morning., Disp: , Rfl:    Continuous Blood Gluc Receiver (DEXCOM G7 RECEIVER) DEVI, Use to check blood sugar. E11.65, Disp: 1 each, Rfl: 0   Continuous Glucose Sensor (DEXCOM G7 SENSOR) MISC, Change sensors every 10 days, Disp: 9 each, Rfl: 3   fluconazole (DIFLUCAN) 150 MG tablet, Take 1 tablet (150 mg total) by mouth daily as needed., Disp: 10 tablet, Rfl: 3   furosemide (LASIX) 40 MG tablet, TAKE 1 TABLET BY MOUTH TWICE A DAY (Patient taking differently: Take 40 mg by mouth See admin instructions. Take one tablet (40 mg) by mouth daily when not going out - approximately 5 times week), Disp: 180 tablet, Rfl: 0   gabapentin (NEURONTIN) 100 MG capsule, Take 100 mg by mouth 2 (two) times daily. Take with a 300 mg capsule for a total dose of 400 mg twice daily, Disp: , Rfl:    gabapentin (NEURONTIN) 300 MG capsule, Take 300 mg by mouth 2 (two) times daily. Take with a 100 mg capsule for a total dose of 400 mg twice daily, Disp: , Rfl:    glucose blood (CONTOUR NEXT TEST) test strip, Use to test blood sugar 2 times daily, Disp: 100 each, Rfl: 12   glucose  blood test strip, Use as instructed, Disp: 100 each, Rfl: 0   hydroxychloroquine (PLAQUENIL) 200 MG tablet, Take 1 tablet (200 mg total) by mouth daily., Disp: 90 tablet, Rfl: 0   Insulin Pen Needle 31G X 8 MM MISC, 1 Device by Does not apply route 3 (three) times daily., Disp: 300 each, Rfl: 3   insulin regular human CONCENTRATED (HUMULIN R  U-500 KWIKPEN) 500 UNIT/ML KwikPen, Max daily 200 units, Disp: 12 mL, Rfl: 11   levothyroxine (SYNTHROID) 75 MCG tablet, TAKE 1 TABLET BY MOUTH EVERY DAY, Disp: 90 tablet, Rfl: 0   lidocaine (XYLOCAINE) 5 % ointment, Apply 1 application topically 2 (two) times daily as needed (pain)., Disp: , Rfl:    metFORMIN (GLUCOPHAGE) 1000 MG tablet, Take 1 tablet (1,000 mg total) by mouth daily with breakfast., Disp: 90 tablet, Rfl: 3   methocarbamol (ROBAXIN) 750 MG tablet, Take 1 tablet (750 mg total) by mouth 2 (two) times daily. (Patient taking differently: Take 750 mg by mouth every morning.), Disp: 60 tablet, Rfl: 5   Multiple Vitamin (MULTIVITAMIN WITH MINERALS) TABS tablet, Take 1 tablet by mouth daily., Disp: , Rfl:    Multiple Vitamins-Minerals (AIRBORNE GUMMIES) CHEW, Chew 3 tablets by mouth every morning., Disp: , Rfl:    ondansetron (ZOFRAN) 4 MG tablet, Take 1 tablet (4 mg total) by mouth as directed. Take one Zofran pill 30-60 minutes before each colonoscopy prep dose, Disp: 2 tablet, Rfl: 0   pramipexole (MIRAPEX) 0.5 MG tablet, TAKE 1 TABLET BY MOUTH EVERYDAY AT BEDTIME, Disp: 90 tablet, Rfl: 0   rivaroxaban (XARELTO) 20 MG TABS tablet, TAKE 1 TABLET BY MOUTH EVERY DAY, Disp: 90 tablet, Rfl: 3   sertraline (ZOLOFT) 100 MG tablet, TAKE 2 TABLETS BY MOUTH AT BEDTIME., Disp: 180 tablet, Rfl: 0   tirzepatide (MOUNJARO) 7.5 MG/0.5ML Pen, Inject 7.5 mg into the skin once a week., Disp: 6 mL, Rfl: 3  EXAM:  VITALS per patient if applicable:  GENERAL: alert, oriented, appears well and in no acute distress  HEENT: atraumatic, conjunttiva clear, no obvious  abnormalities on inspection of external nose and ears  NECK: normal movements of the head and neck  LUNGS: on inspection no signs of respiratory distress, breathing rate appears normal, no obvious gross SOB, gasping or wheezing  CV: no obvious cyanosis  MS: moves all visible extremities without noticeable abnormality  PSYCH/NEURO: pleasant and cooperative, no obvious depression or anxiety, speech and thought processing grossly intact  ASSESSMENT AND PLAN: UTI, treat with 7 days of Cipro.  Gershon Crane, MD  Discussed the following assessment and plan:  No diagnosis found.     I discussed the assessment and treatment plan with the patient. The patient was provided an opportunity to ask questions and all were answered. The patient agreed with the plan and demonstrated an understanding of the instructions.   The patient was advised to call back or seek an in-person evaluation if the symptoms worsen or if the condition fails to improve as anticipated.      Review of Systems     Objective:   Physical Exam        Assessment & Plan:

## 2022-10-23 ENCOUNTER — Other Ambulatory Visit: Payer: Self-pay | Admitting: Family Medicine

## 2022-11-01 ENCOUNTER — Other Ambulatory Visit: Payer: Self-pay | Admitting: Family Medicine

## 2022-11-02 ENCOUNTER — Other Ambulatory Visit: Payer: Self-pay | Admitting: Family Medicine

## 2022-11-16 DIAGNOSIS — M069 Rheumatoid arthritis, unspecified: Secondary | ICD-10-CM | POA: Diagnosis not present

## 2022-11-16 DIAGNOSIS — M961 Postlaminectomy syndrome, not elsewhere classified: Secondary | ICD-10-CM | POA: Diagnosis not present

## 2022-11-16 DIAGNOSIS — Z79899 Other long term (current) drug therapy: Secondary | ICD-10-CM | POA: Diagnosis not present

## 2022-11-16 DIAGNOSIS — G894 Chronic pain syndrome: Secondary | ICD-10-CM | POA: Diagnosis not present

## 2022-11-16 DIAGNOSIS — Z8739 Personal history of other diseases of the musculoskeletal system and connective tissue: Secondary | ICD-10-CM | POA: Diagnosis not present

## 2022-12-13 DIAGNOSIS — M2560 Stiffness of unspecified joint, not elsewhere classified: Secondary | ICD-10-CM | POA: Diagnosis not present

## 2022-12-13 DIAGNOSIS — M25532 Pain in left wrist: Secondary | ICD-10-CM | POA: Diagnosis not present

## 2022-12-13 DIAGNOSIS — M059 Rheumatoid arthritis with rheumatoid factor, unspecified: Secondary | ICD-10-CM | POA: Diagnosis not present

## 2022-12-13 DIAGNOSIS — M79642 Pain in left hand: Secondary | ICD-10-CM | POA: Diagnosis not present

## 2022-12-13 DIAGNOSIS — M79641 Pain in right hand: Secondary | ICD-10-CM | POA: Diagnosis not present

## 2022-12-18 ENCOUNTER — Encounter: Payer: Self-pay | Admitting: Family Medicine

## 2022-12-19 ENCOUNTER — Other Ambulatory Visit: Payer: Self-pay | Admitting: Internal Medicine

## 2022-12-19 DIAGNOSIS — E785 Hyperlipidemia, unspecified: Secondary | ICD-10-CM

## 2022-12-23 ENCOUNTER — Other Ambulatory Visit: Payer: Self-pay | Admitting: Family Medicine

## 2022-12-27 DIAGNOSIS — H524 Presbyopia: Secondary | ICD-10-CM | POA: Diagnosis not present

## 2022-12-27 DIAGNOSIS — M0569 Rheumatoid arthritis of multiple sites with involvement of other organs and systems: Secondary | ICD-10-CM | POA: Diagnosis not present

## 2022-12-27 DIAGNOSIS — H5213 Myopia, bilateral: Secondary | ICD-10-CM | POA: Diagnosis not present

## 2022-12-27 DIAGNOSIS — E113393 Type 2 diabetes mellitus with moderate nonproliferative diabetic retinopathy without macular edema, bilateral: Secondary | ICD-10-CM | POA: Diagnosis not present

## 2022-12-27 DIAGNOSIS — H52223 Regular astigmatism, bilateral: Secondary | ICD-10-CM | POA: Diagnosis not present

## 2022-12-27 DIAGNOSIS — H2513 Age-related nuclear cataract, bilateral: Secondary | ICD-10-CM | POA: Diagnosis not present

## 2022-12-27 DIAGNOSIS — Z79899 Other long term (current) drug therapy: Secondary | ICD-10-CM | POA: Diagnosis not present

## 2022-12-28 ENCOUNTER — Telehealth (INDEPENDENT_AMBULATORY_CARE_PROVIDER_SITE_OTHER): Payer: Medicare Other | Admitting: Family Medicine

## 2022-12-28 ENCOUNTER — Encounter: Payer: Self-pay | Admitting: Family Medicine

## 2022-12-28 DIAGNOSIS — R319 Hematuria, unspecified: Secondary | ICD-10-CM

## 2022-12-28 DIAGNOSIS — N390 Urinary tract infection, site not specified: Secondary | ICD-10-CM | POA: Diagnosis not present

## 2022-12-28 MED ORDER — CIPROFLOXACIN HCL 500 MG PO TABS: 500.0000 mg | ORAL_TABLET | Freq: Two times a day (BID) | ORAL | 0 refills | Status: DC

## 2022-12-28 NOTE — Progress Notes (Signed)
Subjective:    Patient ID: Cheyenne Guzman, female    DOB: 07-11-1965, 57 y.o.   MRN: [id number]  HPI Virtual Visit via Video Note  I connected with the patient on 12/28/22 at 10:30 AM EST by a video enabled telemedicine application and verified that I am speaking with the correct person using two identifiers.  Location patient: home Location provider:work or home office Persons participating in the virtual visit: patient, provider  I discussed the limitations of evaluation and management by telemedicine and the availability of in person appointments. The patient expressed understanding and agreed to proceed.   HPI: Here for what she thinks is a UTI. For the past 2 weeks she has had urinary burning and urgency. No fever. She drinks plenty of water every day.    ROS: See pertinent positives and negatives per HPI.  Past Medical History:  Diagnosis Date   Anxiety    per pt. - mild    Arthritis    back & ankles    Asthma    seasonal    Breast cancer (HCC)    Burning sensation of mouth 10/07/2020   Cancer (HCC)    breast, sees Dr. Darnelle Catalan , Left - 11/2007   Candida vaginitis 10/07/2020   Cellulitis 2020   severe - Left leg    Complication of anesthesia 2013   severe muscle spasms-sore-no doc   Depression    Diabetes mellitus    sees Dr. Talmage Nap, diagnosed 2003   HA (headache)    Hearing loss    Went to [facility]ro[facility],Dr Laing,both ears   Hyperlipidemia    Kidney infection    - hosp. [facility]- septic- 10/2009   Low back pain    Muscle tear 10/07/2020   Peripheral neuropathy    R sided numbness- face & jaw   Pulmonary embolism (HCC) 2020   Sepsis (HCC) 2020   Sleep apnea 09/2013   severe-just started cpap-doing well   Sore throat 10/07/2020   TIA (transient ischemic attack)    in 2007,2008, and 2011   Tinnitus of right ear    Urosepsis 08/2009   with Klebsiella    Past Surgical History:  Procedure Laterality Date   ABDOMINAL HYSTERECTOMY  08/2004    BREAST BIOPSY  06/22/2011   Procedure: BREAST BIOPSY;  Surgeon: Almond Lint, MD;  [facility]: [facility];  Service: General;  Laterality: Right;   BREAST LUMPECTOMY     BREAST SURGERY  11/2007   left ductal carcinoma in situ   CARPAL TUNNEL RELEASE Right 10/13/2013   Procedure: RIGHT CARPAL TUNNEL RELEASE;  Surgeon: Velna Ochs, MD;  Location: [facility];  Service: Orthopedics;  Laterality: Right;   CARPAL TUNNEL RELEASE Left 11/24/2013   Procedure: LEFT CARPAL TUNNEL RELEASE WITH CYST EXCISION ;  Surgeon: Velna Ochs, MD;  Location: [facility];  Service: Orthopedics;  Laterality: Left;   COLONOSCOPY WITH PROPOFOL N/A 10/10/2020   Procedure: COLONOSCOPY WITH PROPOFOL;  Surgeon: Benancio Deeds, MD;  Location: [facility];  Service: Gastroenterology;  Laterality: N/A;   EAR CYST EXCISION Left 11/24/2013   Procedure: CYST REMOVAL;  Surgeon: Velna Ochs, MD;  Location: [facility];  Service: Orthopedics;  Laterality: Left;   FOOT SURGERY  2000   plantar fasciitis-left   LUMBAR DISC SURGERY  03-29-14   Fusion, revision 04-27-14 L4-5 Cauda Equina with graft   NERVE GRAFT  06-01-08   cadaver graft to right inferior alveolar  nerve  in [place] -mouth   POLYPECTOMY  10/10/2020   Procedure: POLYPECTOMY;  Surgeon: Benancio Deeds, MD;  Location: [facility];  Service: Gastroenterology;;    Family History  Problem Relation Age of Onset   Cancer Mother        breast   Cancer Father        lung   Diabetes Father    Colon cancer Sister    Cancer Sister 74       colon   Diabetes Sister    Cancer Maternal Aunt        breast - both   Anesthesia problems Neg Hx      Current Outpatient Medications:    albuterol (PROVENTIL) (2.5 MG/3ML) 0.083% nebulizer solution, Take 3 mLs (2.5 mg total) by nebulization every 4 (four) hours as needed for wheezing or shortness of breath., Disp: 75 mL, Rfl: 1   albuterol (VENTOLIN HFA) 108 (90 Base) MCG/ACT  inhaler, INHALE 2 PUFFS INTO THE LUNGS EVERY 4 HOURS AS NEEDED FOR WHEEZING OR SHORTNESS OF BREATH., Disp: 20.1 each, Rfl: 1   atorvastatin (LIPITOR) 10 MG tablet, TAKE 1 TABLET BY MOUTH EVERY DAY, Disp: 90 tablet, Rfl: 0   Blood Glucose Monitoring Suppl (CONTOUR NEXT MONITOR) w/Device KIT, 1 each by Does not apply route 2 (two) times daily. To check blood sugars twice a day., Disp: 1 kit, Rfl: 0   Buprenorphine 15 MCG/HR PTWK, Place 1 patch onto the skin every Monday. Uses 20 mcg at this time, Disp: , Rfl: 0   Cholecalciferol (VITAMIN D3) 50 MCG (2000 UT) TABS, Take 2,000 Units by mouth every morning., Disp: , Rfl:    ciprofloxacin (CIPRO) 500 MG tablet, Take 1 tablet (500 mg total) by mouth 2 (two) times daily., Disp: 14 tablet, Rfl: 0   Continuous Blood Gluc Receiver (DEXCOM G7 RECEIVER) DEVI, Use to check blood sugar. E11.65, Disp: 1 each, Rfl: 0   Continuous Glucose Sensor (DEXCOM G7 SENSOR) MISC, Change sensors every 10 days, Disp: 9 each, Rfl: 3   fluconazole (DIFLUCAN) 150 MG tablet, Take 1 tablet (150 mg total) by mouth daily as needed., Disp: 10 tablet, Rfl: 3   furosemide (LASIX) 40 MG tablet, TAKE 1 TABLET BY MOUTH TWICE A DAY (Patient taking differently: Take 40 mg by mouth See admin instructions. Take one tablet (40 mg) by mouth daily when not going out - approximately 5 times week), Disp: 180 tablet, Rfl: 0   gabapentin (NEURONTIN) 100 MG capsule, Take 100 mg by mouth 2 (two) times daily. Take with a 300 mg capsule for a total dose of 400 mg twice daily, Disp: , Rfl:    gabapentin (NEURONTIN) 300 MG capsule, Take 300 mg by mouth 2 (two) times daily. Take with a 100 mg capsule for a total dose of 400 mg twice daily, Disp: , Rfl:    glucose blood (CONTOUR NEXT TEST) test strip, Use to test blood sugar 2 times daily, Disp: 100 each, Rfl: 12   glucose blood test strip, Use as instructed, Disp: 100 each, Rfl: 0   hydroxychloroquine (PLAQUENIL) 200 MG tablet, Take 1 tablet (200 mg total) by  mouth daily., Disp: 90 tablet, Rfl: 0   Insulin Pen Needle 31G X 8 MM MISC, 1 Device by Does not apply route 3 (three) times daily., Disp: 300 each, Rfl: 3   insulin regular human CONCENTRATED (HUMULIN R U-500 KWIKPEN) 500 UNIT/ML KwikPen, Max daily 200 units, Disp: 12 mL, Rfl: 11   levothyroxine (SYNTHROID)  75 MCG tablet, TAKE 1 TABLET BY MOUTH EVERY DAY, Disp: 90 tablet, Rfl: 0   lidocaine (XYLOCAINE) 5 % ointment, Apply 1 application topically 2 (two) times daily as needed (pain)., Disp: , Rfl:    metFORMIN (GLUCOPHAGE) 1000 MG tablet, Take 1 tablet (1,000 mg total) by mouth daily with breakfast., Disp: 90 tablet, Rfl: 3   methocarbamol (ROBAXIN) 750 MG tablet, Take 1 tablet (750 mg total) by mouth 2 (two) times daily. (Patient taking differently: Take 750 mg by mouth every morning.), Disp: 60 tablet, Rfl: 5   Multiple Vitamin (MULTIVITAMIN WITH MINERALS) TABS tablet, Take 1 tablet by mouth daily., Disp: , Rfl:    Multiple Vitamins-Minerals (AIRBORNE GUMMIES) CHEW, Chew 3 tablets by mouth every morning., Disp: , Rfl:    ondansetron (ZOFRAN) 4 MG tablet, Take 1 tablet (4 mg total) by mouth as directed. Take one Zofran pill 30-60 minutes before each colonoscopy prep dose, Disp: 2 tablet, Rfl: 0   pramipexole (MIRAPEX) 0.5 MG tablet, TAKE 1 TABLET BY MOUTH EVERYDAY AT BEDTIME, Disp: 90 tablet, Rfl: 0   rivaroxaban (XARELTO) 20 MG TABS tablet, TAKE 1 TABLET BY MOUTH EVERY DAY, Disp: 90 tablet, Rfl: 3   sertraline (ZOLOFT) 100 MG tablet, TAKE 2 TABLETS BY MOUTH AT BEDTIME, Disp: 180 tablet, Rfl: 0   tirzepatide (MOUNJARO) 7.5 MG/0.5ML Pen, Inject 7.5 mg into the skin once a week., Disp: 6 mL, Rfl: 3  EXAM:  VITALS per patient if applicable:  GENERAL: alert, oriented, appears well and in no acute distress  HEENT: atraumatic, conjunttiva clear, no obvious abnormalities on inspection of external nose and ears  NECK: normal movements of the head and neck  LUNGS: on inspection no signs of  respiratory distress, breathing rate appears normal, no obvious gross SOB, gasping or wheezing  CV: no obvious cyanosis  MS: moves all visible extremities without noticeable abnormality  PSYCH/NEURO: pleasant and cooperative, no obvious depression or anxiety, speech and thought processing grossly intact  ASSESSMENT AND PLAN:  Discussed the following assessment and plan:  No diagnosis found.     I discussed the assessment and treatment plan with the patient. The patient was provided an opportunity to ask questions and all were answered. The patient agreed with the plan and demonstrated an understanding of the instructions.   The patient was advised to call back or seek an in-person evaluation if the symptoms worsen or if the condition fails to improve as anticipated.      Review of Systems  Constitutional: Negative.   Respiratory: Negative.    Cardiovascular: Negative.   Genitourinary:  Positive for dysuria, frequency and urgency. Negative for flank pain and hematuria.       Objective:   Physical Exam        Assessment & Plan:  UTI. Treat with 7 days of Cipro 500 mg BID.  Gershon Crane, MD

## 2022-12-28 NOTE — Telephone Encounter (Signed)
She had a virtual OV this morning

## 2023-01-09 ENCOUNTER — Other Ambulatory Visit: Payer: Self-pay | Admitting: Internal Medicine

## 2023-01-20 ENCOUNTER — Other Ambulatory Visit: Payer: Self-pay | Admitting: Family Medicine

## 2023-01-30 ENCOUNTER — Encounter: Payer: Self-pay | Admitting: Family Medicine

## 2023-02-02 ENCOUNTER — Other Ambulatory Visit: Payer: Self-pay | Admitting: Family Medicine

## 2023-02-03 ENCOUNTER — Other Ambulatory Visit: Payer: Self-pay | Admitting: Family Medicine

## 2023-02-05 ENCOUNTER — Encounter: Payer: Self-pay | Admitting: Family Medicine

## 2023-02-05 ENCOUNTER — Telehealth (INDEPENDENT_AMBULATORY_CARE_PROVIDER_SITE_OTHER): Payer: Medicare Other | Admitting: Family Medicine

## 2023-02-05 DIAGNOSIS — N390 Urinary tract infection, site not specified: Secondary | ICD-10-CM

## 2023-02-05 MED ORDER — NITROFURANTOIN MONOHYD MACRO 100 MG PO CAPS: 100.0000 mg | ORAL_CAPSULE | Freq: Two times a day (BID) | ORAL | 2 refills | Status: DC

## 2023-02-05 NOTE — Progress Notes (Signed)
 Subjective:    Patient ID: Cheyenne Guzman, female    DOB: September 13, 1965, 58 y.o.   MRN: [id number]  HPI Virtual Visit via Video Note  I connected with the patient on 02/05/23 at  4:00 PM EST by a video enabled telemedicine application and verified that I am speaking with the correct person using two identifiers.  Location patient: home Location provider:work or home office Persons participating in the virtual visit: patient, provider  I discussed the limitations of evaluation and management by telemedicine and the availability of in person appointments. The patient expressed understanding and agreed to proceed.   HPI: Here for recurrent UTI symptoms of urgency and burning. No fever. On 12-28-22 she was treated with 7 days of Cipro , and the symptoms went away. Now for the past week they are back again. She is drinking water as much as she can.    ROS: See pertinent positives and negatives per HPI.  Past Medical History:  Diagnosis Date   Anxiety    per pt. - mild    Arthritis    back & ankles    Asthma    seasonal    Breast cancer (HCC)    Burning sensation of mouth 10/07/2020   Cancer (HCC)    breast, sees Dr. Layla , Left - 11/2007   Candida vaginitis 10/07/2020   Cellulitis 2020   severe - Left leg    Complication of anesthesia 2013   severe muscle spasms-sore-no doc   Depression    Diabetes mellitus    sees Dr. Tommas, diagnosed 2003   HA (headache)    Hearing loss    Went to [facility] and Thro[facility],Dr Laing,both ears   Hyperlipidemia    Kidney infection    - hosp. [facility]- septic- 10/2009   Low back pain    Muscle tear 10/07/2020   Peripheral neuropathy    R sided numbness- face & jaw   Pulmonary embolism (HCC) 2020   Sepsis (HCC) 2020   Sleep apnea 09/2013   severe-just started cpap-doing well   Sore throat 10/07/2020   TIA (transient ischemic attack)    in 2007,2008, and 2011   Tinnitus of right ear    Urosepsis 08/2009   with Klebsiella    Past  Surgical History:  Procedure Laterality Date   ABDOMINAL HYSTERECTOMY  08/2004   BREAST BIOPSY  06/22/2011   Procedure: BREAST BIOPSY;  Surgeon: Jina Nephew, MD;  Location: [facility];  Service: General;  Laterality: Right;   BREAST LUMPECTOMY     BREAST SURGERY  11/2007   left ductal carcinoma in situ   CARPAL TUNNEL RELEASE Right 10/13/2013   Procedure: RIGHT CARPAL TUNNEL RELEASE;  Surgeon: Maude KANDICE Herald, MD;  Location: [facility];  Service: Orthopedics;  Laterality: Right;   CARPAL TUNNEL RELEASE Left 11/24/2013   Procedure: LEFT CARPAL TUNNEL RELEASE WITH CYST EXCISION ;  Surgeon: Maude KANDICE Herald, MD;  Location: [facility];  Service: Orthopedics;  Laterality: Left;   COLONOSCOPY WITH PROPOFOL  N/A 10/10/2020   Procedure: COLONOSCOPY WITH PROPOFOL ;  Surgeon: Leigh Elspeth SQUIBB, MD;  Location: [facility];  Service: Gastroenterology;  Laterality: N/A;   EAR CYST EXCISION Left 11/24/2013   Procedure: CYST REMOVAL;  Surgeon: Maude KANDICE Herald, MD;  Location: [facility];  Service: Orthopedics;  Laterality: Left;   FOOT SURGERY  2000   plantar fasciitis-left   LUMBAR DISC SURGERY  03-29-14   Fusion, revision 04-27-14 L4-5 Cauda Equina  with graft   NERVE GRAFT  06-01-08   cadaver graft to right inferior alveolar nerve  in [place]  -mouth   POLYPECTOMY  10/10/2020   Procedure: POLYPECTOMY;  Surgeon: Leigh Elspeth SQUIBB, MD;  Location: [facility];  Service: Gastroenterology;;    Family History  Problem Relation Age of Onset   Cancer Mother        breast   Cancer Father        lung   Diabetes Father    Colon cancer Sister    Cancer Sister 16       colon   Diabetes Sister    Cancer Maternal Aunt        breast - both   Anesthesia problems Neg Hx      Current Outpatient Medications:    albuterol  (PROVENTIL ) (2.5 MG/3ML) 0.083% nebulizer solution, Take 3 mLs (2.5 mg total) by nebulization every 4 (four) hours as needed for wheezing or shortness of  breath., Disp: 75 mL, Rfl: 1   albuterol  (VENTOLIN  HFA) 108 (90 Base) MCG/ACT inhaler, INHALE 2 PUFFS INTO THE LUNGS EVERY 4 HOURS AS NEEDED FOR WHEEZING OR SHORTNESS OF BREATH., Disp: 20.1 each, Rfl: 1   atorvastatin  (LIPITOR) 10 MG tablet, TAKE 1 TABLET BY MOUTH EVERY DAY, Disp: 90 tablet, Rfl: 0   Blood Glucose Monitoring Suppl (CONTOUR NEXT MONITOR) w/Device KIT, 1 each by Does not apply route 2 (two) times daily. To check blood sugars twice a day., Disp: 1 kit, Rfl: 0   Buprenorphine  15 MCG/HR PTWK, Place 1 patch onto the skin every Monday. Uses 20 mcg at this time, Disp: , Rfl: 0   Cholecalciferol  (VITAMIN D3) 50 MCG (2000 UT) TABS, Take 2,000 Units by mouth every morning., Disp: , Rfl:    ciprofloxacin  (CIPRO ) 500 MG tablet, Take 1 tablet (500 mg total) by mouth 2 (two) times daily., Disp: 14 tablet, Rfl: 0   Continuous Blood Gluc Receiver (DEXCOM G7 RECEIVER) DEVI, Use to check blood sugar. E11.65, Disp: 1 each, Rfl: 0   Continuous Glucose Sensor (DEXCOM G7 SENSOR) MISC, Change sensors every 10 days, Disp: 9 each, Rfl: 3   fluconazole  (DIFLUCAN ) 150 MG tablet, Take 1 tablet (150 mg total) by mouth daily as needed., Disp: 10 tablet, Rfl: 3   furosemide  (LASIX ) 40 MG tablet, TAKE 1 TABLET BY MOUTH TWICE A DAY (Patient taking differently: Take 40 mg by mouth See admin instructions. Take one tablet (40 mg) by mouth daily when not going out - approximately 5 times week), Disp: 180 tablet, Rfl: 0   gabapentin  (NEURONTIN ) 100 MG capsule, Take 100 mg by mouth 2 (two) times daily. Take with a 300 mg capsule for a total dose of 400 mg twice daily, Disp: , Rfl:    gabapentin  (NEURONTIN ) 300 MG capsule, Take 300 mg by mouth 2 (two) times daily. Take with a 100 mg capsule for a total dose of 400 mg twice daily, Disp: , Rfl:    glucose blood (CONTOUR NEXT TEST) test strip, Use to test blood sugar 2 times daily, Disp: 100 each, Rfl: 12   glucose blood test strip, Use as instructed, Disp: 100 each, Rfl: 0    hydroxychloroquine  (PLAQUENIL ) 200 MG tablet, Take 1 tablet (200 mg total) by mouth daily., Disp: 90 tablet, Rfl: 0   Insulin  Pen Needle 31G X 8 MM MISC, 1 Device by Does not apply route 3 (three) times daily., Disp: 300 each, Rfl: 3   insulin  regular human CONCENTRATED (HUMULIN  R U-500 KWIKPEN)  500 UNIT/ML KwikPen, Max daily 200 units, Disp: 12 mL, Rfl: 11   levothyroxine  (SYNTHROID ) 75 MCG tablet, TAKE 1 TABLET BY MOUTH EVERY DAY, Disp: 90 tablet, Rfl: 0   lidocaine  (XYLOCAINE ) 5 % ointment, Apply 1 application topically 2 (two) times daily as needed (pain)., Disp: , Rfl:    metFORMIN  (GLUCOPHAGE ) 1000 MG tablet, Take 1 tablet (1,000 mg total) by mouth daily with breakfast., Disp: 90 tablet, Rfl: 1   methocarbamol  (ROBAXIN ) 750 MG tablet, Take 1 tablet (750 mg total) by mouth 2 (two) times daily. (Patient taking differently: Take 750 mg by mouth every morning.), Disp: 60 tablet, Rfl: 5   Multiple Vitamin (MULTIVITAMIN WITH MINERALS) TABS tablet, Take 1 tablet by mouth daily., Disp: , Rfl:    Multiple Vitamins-Minerals (AIRBORNE GUMMIES) CHEW, Chew 3 tablets by mouth every morning., Disp: , Rfl:    ondansetron  (ZOFRAN ) 4 MG tablet, Take 1 tablet (4 mg total) by mouth as directed. Take one Zofran  pill 30-60 minutes before each colonoscopy prep dose, Disp: 2 tablet, Rfl: 0   pramipexole  (MIRAPEX ) 0.5 MG tablet, TAKE 1 TABLET BY MOUTH EVERYDAY AT BEDTIME, Disp: 90 tablet, Rfl: 0   rivaroxaban  (XARELTO ) 20 MG TABS tablet, TAKE 1 TABLET BY MOUTH EVERY DAY, Disp: 90 tablet, Rfl: 3   sertraline  (ZOLOFT ) 100 MG tablet, TAKE 2 TABLETS BY MOUTH AT BEDTIME, Disp: 180 tablet, Rfl: 0   tirzepatide  (MOUNJARO ) 7.5 MG/0.5ML Pen, Inject 7.5 mg into the skin once a week., Disp: 6 mL, Rfl: 3  EXAM:  VITALS per patient if applicable:  GENERAL: alert, oriented, appears well and in no acute distress  HEENT: atraumatic, conjunttiva clear, no obvious abnormalities on inspection of external nose and ears  NECK:  normal movements of the head and neck  LUNGS: on inspection no signs of respiratory distress, breathing rate appears normal, no obvious gross SOB, gasping or wheezing  CV: no obvious cyanosis  MS: moves all visible extremities without noticeable abnormality  PSYCH/NEURO: pleasant and cooperative, no obvious depression or anxiety, speech and thought processing grossly intact  ASSESSMENT AND PLAN: Recurrent UTI. It is difficult for her to come into the clinic so we could get a urine culture. In lieu of that, we agreed to treat her with 10 days of Nitrofurantoin . If this is not successful, she will make an in person OV with us .  Garnette Olmsted, MD  Discussed the following assessment and plan:  No diagnosis found.     I discussed the assessment and treatment plan with the patient. The patient was provided an opportunity to ask questions and all were answered. The patient agreed with the plan and demonstrated an understanding of the instructions.   The patient was advised to call back or seek an in-person evaluation if the symptoms worsen or if the condition fails to improve as anticipated.      Review of Systems     Objective:   Physical Exam        Assessment & Plan:

## 2023-02-06 NOTE — Telephone Encounter (Signed)
 She had an OV yesterday for this

## 2023-02-12 ENCOUNTER — Ambulatory Visit: Payer: Medicare Other | Admitting: Internal Medicine

## 2023-02-12 ENCOUNTER — Telehealth: Payer: Self-pay | Admitting: Family Medicine

## 2023-02-12 VITALS — BP 124/70 | HR 85 | Ht 67.0 in | Wt >= 6400 oz

## 2023-02-12 DIAGNOSIS — E785 Hyperlipidemia, unspecified: Secondary | ICD-10-CM | POA: Diagnosis not present

## 2023-02-12 DIAGNOSIS — E1142 Type 2 diabetes mellitus with diabetic polyneuropathy: Secondary | ICD-10-CM

## 2023-02-12 DIAGNOSIS — Z794 Long term (current) use of insulin: Secondary | ICD-10-CM | POA: Diagnosis not present

## 2023-02-12 DIAGNOSIS — M7918 Myalgia, other site: Secondary | ICD-10-CM | POA: Diagnosis not present

## 2023-02-12 DIAGNOSIS — G894 Chronic pain syndrome: Secondary | ICD-10-CM | POA: Diagnosis not present

## 2023-02-12 DIAGNOSIS — M069 Rheumatoid arthritis, unspecified: Secondary | ICD-10-CM | POA: Diagnosis not present

## 2023-02-12 DIAGNOSIS — Z79899 Other long term (current) drug therapy: Secondary | ICD-10-CM | POA: Diagnosis not present

## 2023-02-12 LAB — POCT GLYCOSYLATED HEMOGLOBIN (HGB A1C): Hemoglobin A1C: 7.2 % — AB (ref 4.0–5.6)

## 2023-02-12 MED ORDER — INSULIN PEN NEEDLE 31G X 8 MM MISC: 1.0000 | Freq: Three times a day (TID) | 3 refills | Status: AC

## 2023-02-12 MED ORDER — METFORMIN HCL 1000 MG PO TABS: 1000.0000 mg | ORAL_TABLET | Freq: Every day | ORAL | 1 refills | Status: DC

## 2023-02-12 MED ORDER — ATORVASTATIN CALCIUM 10 MG PO TABS: 10.0000 mg | ORAL_TABLET | Freq: Every day | ORAL | 3 refills | Status: AC

## 2023-02-12 MED ORDER — TIRZEPATIDE 10 MG/0.5ML ~~LOC~~ SOAJ
10.0000 mg | SUBCUTANEOUS | 3 refills | Status: DC
Start: 2023-02-12 — End: 2023-04-09

## 2023-02-12 MED ORDER — HUMULIN R U-500 KWIKPEN 500 UNIT/ML ~~LOC~~ SOPN: PEN_INJECTOR | SUBCUTANEOUS | 6 refills | Status: AC

## 2023-02-12 NOTE — Telephone Encounter (Signed)
Patient dropped off document  disability , to be filled out by provider. Patient requested to send it back via Call Patient to pick up within 7-days. Document is located in providers tray at front office.Please advise at  [phone]

## 2023-02-12 NOTE — Progress Notes (Signed)
Name: Cheyenne Guzman  Age/ Sex: 58 y.o., female   MRN/ DOB: [id number], 03-31-1965     PCP: Nelwyn Salisbury, MD   Reason for Endocrinology Evaluation: Type 2 Diabetes Mellitus  Initial Endocrine Consultative Visit: 08/06/2016    PATIENT IDENTIFIER: Cheyenne Guzman is a 58 y.o. female with a past medical history of T2DM, HTN, OSA. The patient has followed with Endocrinology clinic since 08/06/2016 for consultative assistance with management of her diabetes.     DIABETIC HISTORY:  [name] was diagnosed with T2DM in 2000. Has been on Metformin without side effects  .Has been on insulin since 2009. Her hemoglobin A1c has ranged from 8.2% in 2017, peaking at 10.3% in 2019.  Re-established with me 08/2018. Stopped lantus and started Novolog mix as well as metformin.   Attempted to prescribe Ozempic in 2021 but it was cost prohibitive  By September 2022 we will switch Humalog mix to Humulin U-500   Started Mounjaro 10/2021 SUBJECTIVE:    Today (02/12/2023): Ms. [name] is here for a follow up on diabetes management . She checks her glucose multiple times daily through CGM technology.  She has not been noted with hypoglycemia. She  Is symptomatic with these visit   She continues to follow-up with rheumatology for rheumatoid arthritis  Has chronic nausea but no vomiting  Has occasional constipation    HOME REGIMEN:  Metformin 1000 mg, with breakfast  Mounjaro 7.5 mg weekly  Humulin U-500 , 60 units with breakfast, 36 units with lunch, and 36 units with supper Lipitor 10 mg daily    Statin: yes ACE-I/ARB: no Prior Diabetic Education: Yes   CONTINUOUS GLUCOSE MONITORING RECORD INTERPRETATION    Dates of Recording: 1/8-1/21/2025  Sensor description: Dexcom  Results statistics:   CGM use % of time 90  Average and SD 189/31  Time in range   43  %  % Time Above 180 53  % Time above 250 4  % Time Below target 0   Glycemic patterns summary: BG's are stable overnight  and fluctuate during the day  Hyperglycemic episodes postprandial  Hypoglycemic episodes occurred N/A   Overnight periods: Stable   DIABETIC COMPLICATIONS: Microvascular complications:  Neuropathy, ? Mild retinopathy  Denies: CKD Last Eye Exam: Completed 11/2020  Macrovascular complications:  TIA Denies: CAD, CVA, PVD   HISTORY:  Past Medical History:  Past Medical History:  Diagnosis Date   Anxiety    per pt. - mild    Arthritis    back & ankles    Asthma    seasonal    Breast cancer (HCC)    Burning sensation of mouth 10/07/2020   Cancer (HCC)    breast, sees Dr. Darnelle Catalan , Left - 11/2007   Candida vaginitis 10/07/2020   Cellulitis 2020   severe - Left leg    Complication of anesthesia 2013   severe muscle spasms-sore-no doc   Depression    Diabetes mellitus    sees Dr. Talmage Nap, diagnosed 2003   HA (headache)    Hearing loss    Went to [facility],Dr Laing,both ears   Hyperlipidemia    Kidney infection    - hosp. [facility]- septic- 10/2009   Low back pain    Muscle tear 10/07/2020   Peripheral neuropathy    R sided numbness- face & jaw   Pulmonary embolism (HCC) 2020   Sepsis (HCC) 2020   Sleep apnea 09/2013   severe-just started cpap-doing well  Sore throat 10/07/2020   TIA (transient ischemic attack)    in 2007,2008, and 2011   Tinnitus of right ear    Urosepsis 08/2009   with Klebsiella   Past Surgical History:  Past Surgical History:  Procedure Laterality Date   ABDOMINAL HYSTERECTOMY  08/2004   BREAST BIOPSY  06/22/2011   Procedure: BREAST BIOPSY;  Surgeon: Almond Lint, MD;  Location: [facility] OR;  Service: General;  Laterality: Right;   BREAST LUMPECTOMY     BREAST SURGERY  11/2007   left ductal carcinoma in situ   CARPAL TUNNEL RELEASE Right 10/13/2013   Procedure: RIGHT CARPAL TUNNEL RELEASE;  Surgeon: Velna Ochs, MD;  Location: [facility];  Service: Orthopedics;  Laterality: Right;   CARPAL TUNNEL RELEASE  Left 11/24/2013   Procedure: LEFT CARPAL TUNNEL RELEASE WITH CYST EXCISION ;  Surgeon: Velna Ochs, MD;  Location: [facility];  Service: Orthopedics;  Laterality: Left;   COLONOSCOPY WITH PROPOFOL N/A 10/10/2020   Procedure: COLONOSCOPY WITH PROPOFOL;  Surgeon: Benancio Deeds, MD;  Location: [facility];  Service: Gastroenterology;  Laterality: N/A;   EAR CYST EXCISION Left 11/24/2013   Procedure: CYST REMOVAL;  Surgeon: Velna Ochs, MD;  Location: [facility];  Service: Orthopedics;  Laterality: Left;   FOOT SURGERY  2000   plantar fasciitis-left   LUMBAR DISC SURGERY  03-29-14   Fusion, revision 04-27-14 L4-5 Cauda Equina with graft   NERVE GRAFT  06-01-08   cadaver graft to right inferior alveolar nerve  in [place]   POLYPECTOMY  10/10/2020   Procedure: POLYPECTOMY;  Surgeon: Benancio Deeds, MD;  Location: [facility];  Service: Gastroenterology;;   Social History:  reports that she quit smoking about 31 years ago. Her smoking use included cigarettes. She has never used smokeless tobacco. She reports that she does not currently use alcohol. She reports that she does not use drugs. Family History:  Family History  Problem Relation Age of Onset   Cancer Mother        breast   Cancer Father        lung   Diabetes Father    Colon cancer Sister    Cancer Sister 71       colon   Diabetes Sister    Cancer Maternal Aunt        breast - both   Anesthesia problems Neg Hx      HOME MEDICATIONS: Allergies as of 02/12/2023       Reactions   Vicodin [hydrocodone-acetaminophen] Nausea And Vomiting        Medication List        Accurate as of February 12, 2023  3:43 PM. If you have any questions, ask your nurse or doctor.          STOP taking these medications    tirzepatide 7.5 MG/0.5ML Pen Commonly known as: MOUNJARO Replaced by: tirzepatide 10 MG/0.5ML Pen       TAKE these medications    Airborne Gummies Chew Chew 3  tablets by mouth every morning.   albuterol (2.5 MG/3ML) 0.083% nebulizer solution Commonly known as: PROVENTIL Take 3 mLs (2.5 mg total) by nebulization every 4 (four) hours as needed for wheezing or shortness of breath.   albuterol 108 (90 Base) MCG/ACT inhaler Commonly known as: VENTOLIN HFA INHALE 2 PUFFS INTO THE LUNGS EVERY 4 HOURS AS NEEDED FOR WHEEZING OR SHORTNESS OF BREATH.   atorvastatin 10 MG tablet Commonly known  as: LIPITOR TAKE 1 TABLET BY MOUTH EVERY DAY   buprenorphine 15 MCG/HR Commonly known as: BUTRANS Place 1 patch onto the skin every Monday. Uses 20 mcg at this time   Contour Next Monitor w/Device Kit 1 each by Does not apply route 2 (two) times daily. To check blood sugars twice a day.   Dexcom G7 Receiver Devi Use to check blood sugar. E11.65   Dexcom G7 Sensor Misc Change sensors every 10 days   fluconazole 150 MG tablet Commonly known as: DIFLUCAN Take 1 tablet (150 mg total) by mouth daily as needed.   furosemide 40 MG tablet Commonly known as: LASIX TAKE 1 TABLET BY MOUTH TWICE A DAY What changed:  when to take this additional instructions   gabapentin 300 MG capsule Commonly known as: NEURONTIN Take 300 mg by mouth 2 (two) times daily. Take with a 100 mg capsule for a total dose of 400 mg twice daily   gabapentin 100 MG capsule Commonly known as: NEURONTIN Take 100 mg by mouth 2 (two) times daily. Take with a 300 mg capsule for a total dose of 400 mg twice daily   glucose blood test strip Use as instructed   glucose blood test strip Commonly known as: Contour Next Test Use to test blood sugar 2 times daily   HumuLIN R U-500 KwikPen 500 UNIT/ML KwikPen Generic drug: insulin regular human CONCENTRATED Inject 50 Units into the skin daily with breakfast AND 36 Units daily before lunch AND 36 Units daily before supper. Max daily 200 units. What changed: See the new instructions.   hydroxychloroquine 200 MG tablet Commonly known as:  PLAQUENIL Take 1 tablet (200 mg total) by mouth daily.   Insulin Pen Needle 31G X 8 MM Misc 1 Device by Does not apply route 3 (three) times daily.   levothyroxine 75 MCG tablet Commonly known as: SYNTHROID TAKE 1 TABLET BY MOUTH EVERY DAY   lidocaine 5 % ointment Commonly known as: XYLOCAINE Apply 1 application topically 2 (two) times daily as needed (pain).   metFORMIN 1000 MG tablet Commonly known as: GLUCOPHAGE Take 1 tablet (1,000 mg total) by mouth daily with breakfast.   methocarbamol 750 MG tablet Commonly known as: ROBAXIN Take 1 tablet (750 mg total) by mouth 2 (two) times daily. What changed: when to take this   multivitamin with minerals Tabs tablet Take 1 tablet by mouth daily.   nitrofurantoin (macrocrystal-monohydrate) 100 MG capsule Commonly known as: Macrobid Take 1 capsule (100 mg total) by mouth 2 (two) times daily.   ondansetron 4 MG tablet Commonly known as: ZOFRAN Take 1 tablet (4 mg total) by mouth as directed. Take one Zofran pill 30-60 minutes before each colonoscopy prep dose   pramipexole 0.5 MG tablet Commonly known as: MIRAPEX TAKE 1 TABLET BY MOUTH EVERYDAY AT BEDTIME   sertraline 100 MG tablet Commonly known as: ZOLOFT TAKE 2 TABLETS BY MOUTH AT BEDTIME   tirzepatide 10 MG/0.5ML Pen Commonly known as: MOUNJARO Inject 10 mg into the skin once a week. Replaces: tirzepatide 7.5 MG/0.5ML Pen   Vitamin D3 50 MCG (2000 UT) Tabs Take 2,000 Units by mouth every morning.   Xarelto 20 MG Tabs tablet Generic drug: rivaroxaban TAKE 1 TABLET BY MOUTH EVERY DAY         OBJECTIVE:   Vital Signs: BP 124/70   Pulse 85   Ht 5\' 7"  (1.702 m)   Wt (!) 428 lb 9.6 oz (194.4 kg)   SpO2 94%  BMI 67.13 kg/m   Wt Readings from Last 3 Encounters:  02/12/23 (!) 428 lb 9.6 oz (194.4 kg)  03/16/22 (!) 429 lb (194.6 kg)  02/19/22 (!) 432 lb (196 kg)     Exam: General: Pt appears well and is in NAD, in a wheelchair  Lungs: Clear with good BS  bilat   Heart: RRR  Extremities: Trace edema   Neuro: MS is good with appropriate affect, pt is alert and Ox3   DM Foot Exam 03/16/2022  The skin of the feet is intact without sores or ulcerations. The pedal pulses are 1+ on right and 1+ on left. The sensation is decreased  to a screening 5.07, 10 gram monofilament bilaterally    DATA REVIEWED:  Lab Results  Component Value Date   HGBA1C 7.2 (A) 02/12/2023   HGBA1C 7.2 (A) 03/16/2022   HGBA1C 8.8 (A) 11/08/2021     ASSESSMENT / PLAN / RECOMMENDATIONS:   1) Type 2 Diabetes Mellitus, Optimally  controlled, With retinopathic and neuropathic complications - Most recent A1c of 7.2 %. Goal A1c < 7.0 %.   -A1c remains close to goal -She does have chronic nausea this is not attributed to [facility], patient would like to increase Mounjaro as that has not been much weight loss recently. -Will decrease Humulin U-500 in the morning and she will use her judgment for the lunch and suppertime. -The patient was given printed lab orders to have BMP, MA/CR ratio, lipid and TFTs done at [facility], she will return for this as she has another appointment   MEDICATIONS:  -Change Humulin U-500 at 50 units before breakfast, 36 units before lunch and 36 units before supper - Continue Metformin 1000 mg daily beakfast  -Increase Mounjaro 10 mg weekly   EDUCATION / INSTRUCTIONS: BG monitoring instructions: Patient is instructed to check her blood sugars 3 times a day, before each meal Call [facility] if: BG persistently < 70  I reviewed the Rule of 15 for the treatment of hypoglycemia in detail with the patient. Literature supplied.    2) Diabetic complications:  Eye: Does have known diabetic retinopathy. Per pt (mild) Neuro/ Feet: Does have known diabetic peripheral neuropathy .  Renal: Patient does not have known baseline CKD. She   is not on an ACEI/ARB at present.   3) Dyslipidemia:  -Patient was provided with printed lab  orders to check lipid panel, she will return for this -In the meantime she will continue with atorvastatin    Medication Continue atorvastatin 10 mg daily     F/U in 6 months   I spent 26 minutes preparing to see the patient by review of recent labs, imaging and procedures, obtaining and reviewing separately obtained history, communicating with the patient/family or caregiver, ordering medications, tests or procedures, and documenting clinical information in the EHR including the differential Dx, treatment, and any further evaluation and other management    Signed electronically by: Lyndle Herrlich, MD  [facility]  [facility] [address] [place] [place], [place] [address] Phone: [phone] FAX: [phone]   CC: Nelwyn Salisbury, MD [address] [place] [address] Phone: [phone]  Fax: [phone]  Return to Endocrinology clinic as below: Future Appointments  Date Time Provider Department Center  08/15/2023 10:10 AM Ranesha Val, Konrad Dolores, MD [facility] None

## 2023-02-12 NOTE — Telephone Encounter (Deleted)
Patient dropped off document  disability , to be filled out by provider. Patient requested to send it back via Call Patient to pick up within 7-days. Document is located in providers tray at front office.Please advise at  [phone]

## 2023-02-12 NOTE — Patient Instructions (Addendum)
-   Change Humulin U-500  50 units with Breakfast and 36 units with Lunch and 36 units with Supper  - Continue Metformin 1000 mg, daily  - Increase Mounjaro 10 mg once weekly     HOW TO TREAT LOW BLOOD SUGARS (Blood sugar LESS THAN 70 MG/DL) Please follow the RULE OF 15 for the treatment of hypoglycemia treatment (when your (blood sugars are less than 70 mg/dL)   STEP 1: Take 15 grams of carbohydrates when your blood sugar is low, which includes:  3-4 GLUCOSE TABS  OR 3-4 OZ OF JUICE OR REGULAR SODA OR ONE TUBE OF GLUCOSE GEL    STEP 2: RECHECK blood sugar in 15 MINUTES STEP 3: If your blood sugar is still low at the 15 minute recheck --> then, go back to STEP 1 and treat AGAIN with another 15 grams of carbohydrates.

## 2023-02-14 ENCOUNTER — Encounter: Payer: Self-pay | Admitting: Internal Medicine

## 2023-02-15 NOTE — Telephone Encounter (Signed)
Spoke with pt advised that her forms have been completed and faxed. Pt will pick up copy of the form from the office. Form placed on the filling cabinet at the front office

## 2023-03-02 DIAGNOSIS — H00012 Hordeolum externum right lower eyelid: Secondary | ICD-10-CM | POA: Diagnosis not present

## 2023-03-09 DIAGNOSIS — R197 Diarrhea, unspecified: Secondary | ICD-10-CM | POA: Diagnosis not present

## 2023-03-18 ENCOUNTER — Encounter: Payer: Self-pay | Admitting: Internal Medicine

## 2023-03-18 DIAGNOSIS — E1142 Type 2 diabetes mellitus with diabetic polyneuropathy: Secondary | ICD-10-CM

## 2023-03-19 ENCOUNTER — Telehealth: Payer: Self-pay

## 2023-03-19 NOTE — Telephone Encounter (Signed)
 Copied from CRM [id number]. Topic: Clinical - Medical Advice >> Mar 19, 2023  9:44 AM Lennart Pall wrote: Reason for CRM: Patients daughter has the flu, she has been around her. She does not have a flu shot and she wants to know if she can get tamuflu just incase. Please reach out to patient as soon as possible

## 2023-03-21 MED ORDER — OSELTAMIVIR PHOSPHATE 75 MG PO CAPS: 75.0000 mg | ORAL_CAPSULE | Freq: Two times a day (BID) | ORAL | 0 refills | Status: DC

## 2023-03-21 NOTE — Telephone Encounter (Signed)
 I sent in the Tamiflu

## 2023-03-21 NOTE — Addendum Note (Signed)
 Addended by: Gershon Crane A on: 03/21/2023 08:01 AM   Modules accepted: Orders

## 2023-04-09 MED ORDER — TIRZEPATIDE 2.5 MG/0.5ML ~~LOC~~ SOAJ: 2.5000 mg | SUBCUTANEOUS | 6 refills | Status: DC

## 2023-05-02 DIAGNOSIS — M2560 Stiffness of unspecified joint, not elsewhere classified: Secondary | ICD-10-CM | POA: Diagnosis not present

## 2023-05-02 DIAGNOSIS — M79642 Pain in left hand: Secondary | ICD-10-CM | POA: Diagnosis not present

## 2023-05-02 DIAGNOSIS — M79641 Pain in right hand: Secondary | ICD-10-CM | POA: Diagnosis not present

## 2023-05-02 DIAGNOSIS — M059 Rheumatoid arthritis with rheumatoid factor, unspecified: Secondary | ICD-10-CM | POA: Diagnosis not present

## 2023-05-02 DIAGNOSIS — M25532 Pain in left wrist: Secondary | ICD-10-CM | POA: Diagnosis not present

## 2023-05-03 ENCOUNTER — Other Ambulatory Visit: Payer: Self-pay | Admitting: Family Medicine

## 2023-05-04 ENCOUNTER — Other Ambulatory Visit: Payer: Self-pay | Admitting: Family Medicine

## 2023-05-14 DIAGNOSIS — M069 Rheumatoid arthritis, unspecified: Secondary | ICD-10-CM | POA: Diagnosis not present

## 2023-05-14 DIAGNOSIS — G894 Chronic pain syndrome: Secondary | ICD-10-CM | POA: Diagnosis not present

## 2023-05-14 DIAGNOSIS — M7918 Myalgia, other site: Secondary | ICD-10-CM | POA: Diagnosis not present

## 2023-05-14 DIAGNOSIS — Z79899 Other long term (current) drug therapy: Secondary | ICD-10-CM | POA: Diagnosis not present

## 2023-05-28 ENCOUNTER — Ambulatory Visit (INDEPENDENT_AMBULATORY_CARE_PROVIDER_SITE_OTHER): Payer: Medicare Other | Admitting: Family Medicine

## 2023-05-28 DIAGNOSIS — Z0000 Encounter for general adult medical examination without abnormal findings: Secondary | ICD-10-CM | POA: Diagnosis not present

## 2023-05-28 NOTE — Patient Instructions (Signed)
 I really enjoyed getting to talk with you today! I am available on Tuesdays and Thursdays for virtual visits if you have any questions or concerns, or if I can be of any further assistance.   CHECKLIST FROM ANNUAL WELLNESS VISIT:  -Follow up (please call to schedule if not scheduled after visit):   -yearly for annual wellness visit with primary care office  Here is a list of your preventive care/health maintenance measures and the plan for each if any are due:  PLAN For any measures below that may be due:   Health Maintenance  Topic Date Due   OPHTHALMOLOGY EXAM  Please request that your eye doctor send Dr. Alyne Babinski a copy of your diabetic eye exam report. Thank you!   Hepatitis C Screening  This can be done with labs if you ever wish to do - please request if you do.    Zoster Vaccines- Shingrix (1 of 2) No record of done.  All vaccines can be done at the pharmacy. Please request date, vaccine given and pharmacist who gave vaccine or record of vaccine to provide to Dr. Leonce Ralph office so that we can update your record. Thank you.    Cervical Cancer Screening (HPV/Pap Cotest)  Never done per record - please schedule pap smear with Dr. Alyne Babinski or with your gynecologist. If already done please let us  know where this was done so that we can request the report fro your records. Thank you.    Pneumococcal Vaccine 42-93 Years old (2 of 2 - PCV) 11/28/2013 All vaccines can be done at the pharmacy. Please request date, vaccine given and pharmacist who gave vaccine or record of vaccine to provide to Dr. Leonce Ralph office so that we can update your record. Thank you.    Diabetic kidney evaluation - Urine ACR  11/05/2019, please request when you do your labs.    COVID-19 Vaccine (4 - 2024-25 season) 09/23/2022 All vaccines can be done at the pharmacy. Please request date, vaccine given and pharmacist who gave vaccine or record of vaccine to provide to Dr. Leonce Ralph office so that we can update your record. Thank you.     FOOT EXAM  03/17/2023, can be done at your next in office exam, please request   Diabetic kidney evaluation - eGFR measurement  03/24/2023, please request when you do your labs.    HEMOGLOBIN A1C  08/12/2023   MAMMOGRAM  08/13/2023   INFLUENZA VACCINE  08/23/2023   Medicare Annual Wellness (AWV)  05/27/2024   Colonoscopy  10/11/2030   DTaP/Tdap/Td (2 - Td or Tdap) 05/14/2032   HIV Screening  Completed   HPV VACCINES  Aged Out   Meningococcal B Vaccine  Aged Out    -See a dentist at least yearly  -Get your eyes checked and then per your eye specialist's recommendations  -Other issues addressed today:   -I have included below further information regarding a healthy whole foods based diet, physical activity guidelines for adults, stress management and opportunities for social connections. I hope you find this information useful.   -----------------------------------------------------------------------------------------------------------------------------------------------------------------------------------------------------------------------------------------------------------    NUTRITION: -eat real food: lots of colorful vegetables (half the plate) and fruits -5-7 servings of vegetables and fruits per day (fresh or steamed is best), exp. 2 servings of vegetables with lunch and dinner and 2 servings of fruit per day. Berries and greens such as kale and collards are great choices.  -consume on a regular basis:  fresh fruits, fresh veggies, fish, nuts, seeds, healthy oils (such as  olive oil, avocado oil), whole grains (make sure for bread/pasta/crackers/etc., that the first ingredient on label contains the word "whole"), legumes. -can eat small amounts of dairy and lean meat (no larger than the palm of your hand), but avoid processed meats such as ham, bacon, lunch meat, etc. -drink water -try to avoid fast food and pre-packaged foods, processed meat, ultra processed foods/beverages  (donuts, candy, etc.) -most experts advise limiting sodium to < 2300mg  per day, should limit further is any chronic conditions such as high blood pressure, heart disease, diabetes, etc. The American Heart Association advised that < 1500mg  is is ideal -try to avoid foods/beverages that contain any ingredients with names you do not recognize  -try to avoid foods/beverages  with added sugar or sweeteners/sweets  -try to avoid sweet drinks (including diet drinks): soda, juice, Gatorade, sweet tea, power drinks, diet drinks -try to avoid white rice, white bread, pasta (unless whole grain)  EXERCISE GUIDELINES FOR ADULTS: -if you wish to increase your physical activity, do so gradually and with the approval of your doctor -STOP and seek medical care immediately if you have any chest pain, chest discomfort or trouble breathing when starting or increasing exercise  -move and stretch your body, legs, feet and arms when sitting for long periods -Physical activity guidelines for optimal health in adults: -get at least 150 minutes per week of moderate exercise (can talk, but not sing); this is about 20-30 minutes of sustained activity 5-7 days per week or two 10-15 minute episodes of sustained activity 5-7 days per week -do some muscle building/resistance training/strength training at least 2 days per week  -balance exercises 3+ days per week:   Stand somewhere where you have something sturdy to hold onto if you lose balance    1) lift up on toes, then back down, start with 5x per day and work up to 20x   2) stand and lift one leg straight out to the side so that foot is a few inches of the floor, start with 5x each side and work up to 20x each side   3) stand on one foot, start with 5 seconds each side and work up to 20 seconds on each side  If you need ideas or help with getting more active:  -Silver sneakers https://tools.silversneakers.com  -Walk with a Doc: http://www.duncan-williams.com/  -try to  include resistance (weight lifting/strength building) and balance exercises twice per week: or the following link for ideas: http://castillo-powell.com/  BuyDucts.dk  STRESS MANAGEMENT: -can try meditating, or just sitting quietly with deep breathing while intentionally relaxing all parts of your body for 5 minutes daily -if you need further help with stress, anxiety or depression please follow up with your primary doctor or contact the wonderful folks at [facility]: [phone]  SOCIAL CONNECTIONS: -options in [place] if you wish to engage in more social and exercise related activities:  -Silver sneakers https://tools.silversneakers.com  -Walk with a Doc: http://www.duncan-williams.com/  -Check out the [facility] Active Adults 50+ section on the [place] of [employer] (hiking clubs, book clubs, cards and games, chess, exercise classes, aquatic classes and much more) - see the website for details: https://www.[place].gov/departments/parks-recreation/active-adults50  -YouTube has lots of exercise videos for different ages and abilities as well  -[facility] (a variety of indoor and outdoor inperson activities for adults). [phone]. [address].  -Virtual Online Classes (a variety of topics): see seniorplanet.org or call ([phone])685-6422  -consider volunteering at a school, hospice center, church, senior center or elsewhere  ADVANCED HEALTHCARE DIRECTIVES:  [place] Advanced Directives assistance:   ExpressWeek.com.cy  Everyone should have advanced health care directives in place. This is so that you get the care you want, should you ever be in a situation where you are unable to make your own medical decisions.   From the [place] Advanced Directive Website: "Advance Health Care  Directives are legal documents in which you give written instructions about your health care if, in the future, you cannot speak for yourself.   A health care power of attorney allows you to name a person you trust to make your health care decisions if you cannot make them yourself. A declaration of a desire for a natural death (or living will) is document, which states that you desire not to have your life prolonged by extraordinary measures if you have a terminal or incurable illness or if you are in a vegetative state. An advance instruction for mental health treatment makes a declaration of instructions, information and preferences regarding your mental health treatment. It also states that you are aware that the advance instruction authorizes a mental health treatment provider to act according to your wishes. It may also outline your consent or refusal of mental health treatment. A declaration of an anatomical gift allows anyone over the age of 51 to make a gift by will, organ donor card or other document."   Please see the following website or an elder law attorney for forms, FAQs and for completion of advanced directives: [place]  Print production planner Health Care Directives Advance Health Care Directives (http://guzman.[email or web address]/)  Or copy and paste the following to your web browser: PoshChat.fi

## 2023-05-28 NOTE — Progress Notes (Addendum)
 PATIENT CHECK-IN and HEALTH RISK ASSESSMENT QUESTIONNAIRE:  -completed by phone/video for upcoming Medicare Preventive Visit  Pre-Visit Check-in: 1)Vitals (height, wt, BP, etc) - record in vitals section for visit on day of visit Request home vitals (wt, BP, etc.) and enter into vitals, THEN update Vital Signs SmartPhrase below at the top of the HPI. See below.  2)Review and Update Medications, Allergies PMH, Surgeries, Social history in Epic 3)Hospitalizations in the last year with date/reason? n  4)Review and Update Care Team (patient's specialists) in Epic 5) Complete PHQ9 in Epic  6) Complete Fall Screening in Epic 7)Review all Health Maintenance Due and order under PCP if not done.  Medicare Wellness Patient Questionnaire:  Answer theses question about your habits: How often do you have a drink containing alcohol?never Have you ever smoked?over 30 years ago Do you use an illicit drugs?n On average, how many days per week do you engage in moderate to strenuous exercise (like a brisk walk)? Joined the [employer] [facility], doing water walking and exercise in the water  On average, how many minutes do you engage in exercise at this level? 2 hours 3 days a week the last 1 mont Typical breakfast: yogurt with fruit - sugar free vanilla, or a rice cake with almond butter, grapes Has been doing a healthier diet with her daughter the last 1 month Typical lunch: typically a salad 3 days a week, sometimes a club sandwich with Tito bread, with some vegetables or progresso soup Typical dinner; wt watchers app approved [place] weight watchers meal - protein and veggies; sometimes a salad, mixed veggies and chicken or steak Typical snacks:n  Beverages: flavored water Social connections: reports has wonderful support and connections with family and friends  Answer theses question about your everyday activities: Can you perform most household chores?y - does have help with housekeeping Are  you deaf or have significant trouble hearing? Has had audiology exam - has some hearing issues, she is considering hearing aids. Do you feel that you have a problem with memory?n Do you feel safe at home?y Last dentist visit? Has PTSD from a bad dental experience, so she takes care of her teeth with brushing and flossing frequently, whenever has issues she sees her friend who is a dentis.  8. Do you have any difficulty performing your everyday activities?y Are you having any difficulty walking, taking medications on your own, and or difficulty managing daily home needs?n Do you have difficulty walking or climbing stairs?y Do you have difficulty dressing or bathing?n Do you have difficulty doing errands alone such as visiting a doctor's office or shopping?y - uses wheelchair Do you currently have any difficulty preparing food and eating?n Do you currently have any difficulty using the toilet?n Do you have any difficulty managing your finances?n Do you have any difficulties with housekeeping of managing your housekeeping?n   Do you have Advanced Directives in place (Living Will, Healthcare Power or Attorney)? n   Last eye Exam and location? Does yearly, had in December with Dr. Elvina Hammers   Do you currently use prescribed or non-prescribed narcotic or opioid pain medications? Buprenorphine ; sees pain management for this  Do you have a history or close family history of breast, ovarian, tubal or peritoneal cancer or a family member with BRCA (breast cancer susceptibility 1 and 2) gene mutations?     ----------------------------------------------------------------------------------------------------------------------------------------------------------------------------------------------------------------------  Because this visit was a virtual/telehealth visit, some criteria may be missing or patient reported. Any vitals not documented were not able  to be obtained and vitals that have  been documented are patient reported.    MEDICARE ANNUAL PREVENTIVE VISIT WITH PROVIDER: (Welcome to Medicare, initial annual wellness or annual wellness exam)  Virtual Visit via Video Note  I connected with SHATON RANDLEMAN on 05/28/23 by a video enabled telemedicine application and verified that I am speaking with the correct person using two identifiers.  Location patient: home Location provider:work or home office Persons participating in the virtual visit: patient, provider  Concerns and/or follow up today: is working on medications for blood sugar with her endocrinologist. Mounjaro  upset her stomach on the high dose. They stopped it for awhile and blood sugars were up - but recently back on a low dose.    See HM section in Epic for other details of completed HM.    ROS: negative for report of fevers, unintentional weight loss, vision changes, vision loss, hearing loss or change, chest pain, sob, hemoptysis, melena, hematochezia, hematuria, falls, bleeding or bruising, thoughts of suicide or self harm, memory loss  Patient-completed extensive health risk assessment - reviewed and discussed with the patient: See Health Risk Assessment completed with patient prior to the visit either above or in recent phone note. This was reviewed in detailed with the patient today and appropriate recommendations, orders and referrals were placed as needed per Summary below and patient instructions.   Review of Medical History: -PMH, PSH, Family History and current specialty and care providers reviewed and updated and listed below   Patient Care Team: Donley Furth, MD as PCP - General (Family Medicine) Maximo Spar Aviva Lemmings, MD as PCP - Cardiology (Cardiology) Janita Mellow, MD as Consulting Physician (Otolaryngology) Thurmon Florida., MD (Ophthalmology) Zilphia Hilt, Daria Eddy, [facility] (Pharmacist)   Past Medical History:  Diagnosis Date   Anxiety    per pt. - mild    Arthritis    back & ankles    Asthma     seasonal    Breast cancer (HCC)    Burning sensation of mouth 10/07/2020   Cancer (HCC)    breast, sees Dr. Charolett Copes , Left - 11/2007   Candida vaginitis 10/07/2020   Cellulitis 2020   severe - Left leg    Complication of anesthesia 2013   severe muscle spasms-sore-no doc   Depression    Diabetes mellitus    sees Dr. Ronelle Coffee, diagnosed 2003   HA (headache)    Hearing loss    Went to [facility],Dr Laing,both ears   Hyperlipidemia    Kidney infection    - hosp. [facility]- septic- 10/2009   Low back pain    Muscle tear 10/07/2020   Peripheral neuropathy    R sided numbness- face & jaw   Pulmonary embolism (HCC) 2020   Sepsis (HCC) 2020   Sleep apnea 09/2013   severe-just started cpap-doing well   Sore throat 10/07/2020   TIA (transient ischemic attack)    in 2007,2008, and 2011   Tinnitus of right ear    Urosepsis 08/2009   with Klebsiella    Past Surgical History:  Procedure Laterality Date   ABDOMINAL HYSTERECTOMY  08/2004   BREAST BIOPSY  06/22/2011   Procedure: BREAST BIOPSY;  Surgeon: Lockie Rima, MD;  Location: [facility] OR;  Service: General;  Laterality: Right;   BREAST LUMPECTOMY     BREAST SURGERY  11/2007   left ductal carcinoma in situ   CARPAL TUNNEL RELEASE Right 10/13/2013   Procedure: RIGHT CARPAL TUNNEL RELEASE;  Surgeon:  Alphonzo Ask, MD;  Location: [facility];  Service: Orthopedics;  Laterality: Right;   CARPAL TUNNEL RELEASE Left 11/24/2013   Procedure: LEFT CARPAL TUNNEL RELEASE WITH CYST EXCISION ;  Surgeon: Alphonzo Ask, MD;  Location: [facility];  Service: Orthopedics;  Laterality: Left;   COLONOSCOPY WITH PROPOFOL  N/A 10/10/2020   Procedure: COLONOSCOPY WITH PROPOFOL ;  Surgeon: Ace Holder, MD;  Location: [facility];  Service: Gastroenterology;  Laterality: N/A;   EAR CYST EXCISION Left 11/24/2013   Procedure: CYST REMOVAL;  Surgeon: Alphonzo Ask, MD;  Location: [facility];   Service: Orthopedics;  Laterality: Left;   FOOT SURGERY  2000   plantar fasciitis-left   LUMBAR DISC SURGERY  03-29-14   Fusion, revision 04-27-14 L4-5 Cauda Equina with graft   NERVE GRAFT  06-01-08   cadaver graft to right inferior alveolar nerve  in [place]  -mouth   POLYPECTOMY  10/10/2020   Procedure: POLYPECTOMY;  Surgeon: Ace Holder, MD;  Location: [facility];  Service: Gastroenterology;;    Social History   Socioeconomic History   Marital status: Married    Spouse name: Charon Copper   Number of children: 2   Years of education: 14   Highest education level: Not on file  Occupational History    Comment: [employer]  Tobacco Use   Smoking status: Former    Current packs/day: 0.00    Types: Cigarettes    Quit date: 02/19/1992    Years since quitting: 31.2   Smokeless tobacco: Never  Vaping Use   Vaping status: Never Used  Substance and Sexual Activity   Alcohol use: Not Currently    Comment: rare   Drug use: No   Sexual activity: Not on file  Other Topics Concern   Not on file  Social History Narrative   Patient lives at home with her husband Charon Copper) and two daughters.    Patient works for [employer] full time patient is on medical leave at this time.   Education two years of college.   Right handed.   Caffeine  two times per day.   Social Drivers of Corporate investment banker Strain: Low Risk  (03/13/2022)   Overall Financial Resource Strain (CARDIA)    Difficulty of Paying Living Expenses: Not very hard  Food Insecurity: No Food Insecurity (03/13/2022)   Hunger Vital Sign    Worried About Running Out of Food in the Last Year: Never true    Ran Out of Food in the Last Year: Never true  Transportation Needs: No Transportation Needs (09/04/2021)   PRAPARE - Administrator, Civil Service (Medical): No    Lack of Transportation (Non-Medical): No  Physical Activity: Sufficiently Active (09/04/2021)   Exercise Vital Sign    Days of Exercise per  Week: 5 days    Minutes of Exercise per Session: 150+ min  Stress: No Stress Concern Present (09/04/2021)   [employer] of Occupational Health - Occupational Stress Questionnaire    Feeling of Stress : Only a little  Social Connections: Unknown (06/01/2021)   Received from [facility]   Social Network    Social Network: Not on file  Intimate Partner Violence: Unknown (04/24/2021)   Received from [facility], [facility]   HITS    Physically Hurt: Not on file    Insult or Talk Down To: Not on file    Threaten Physical Harm: Not on file  Scream or Curse: Not on file    Family History  Problem Relation Age of Onset   Cancer Mother        breast   Cancer Father        lung   Diabetes Father    Colon cancer Sister    Cancer Sister 55       colon   Diabetes Sister    Cancer Maternal Aunt        breast - both   Anesthesia problems Neg Hx     Current Outpatient Medications on File Prior to Visit  Medication Sig Dispense Refill   albuterol  (PROVENTIL ) (2.5 MG/3ML) 0.083% nebulizer solution Take 3 mLs (2.5 mg total) by nebulization every 4 (four) hours as needed for wheezing or shortness of breath. 75 mL 1   albuterol  (VENTOLIN  HFA) 108 (90 Base) MCG/ACT inhaler INHALE 2 PUFFS INTO THE LUNGS EVERY 4 HOURS AS NEEDED FOR WHEEZING OR SHORTNESS OF BREATH. 20.1 each 1   atorvastatin  (LIPITOR) 10 MG tablet Take 1 tablet (10 mg total) by mouth daily. 90 tablet 3   Blood Glucose Monitoring Suppl (CONTOUR NEXT MONITOR) w/Device KIT 1 each by Does not apply route 2 (two) times daily. To check blood sugars twice a day. 1 kit 0   Buprenorphine  15 MCG/HR PTWK Place 1 patch onto the skin every Monday. Uses 20 mcg at this time  0   Cholecalciferol  (VITAMIN D3) 50 MCG (2000 UT) TABS Take 2,000 Units by mouth every morning.     Continuous Blood Gluc Receiver (DEXCOM G7 RECEIVER) DEVI Use to check blood sugar. E11.65 1 each 0   Continuous Glucose Sensor (DEXCOM G7 SENSOR) MISC  Change sensors every 10 days 9 each 3   fluconazole  (DIFLUCAN ) 150 MG tablet Take 1 tablet (150 mg total) by mouth daily as needed. 10 tablet 3   furosemide  (LASIX ) 40 MG tablet TAKE 1 TABLET BY MOUTH TWICE A DAY (Patient taking differently: Take 40 mg by mouth See admin instructions. Take one tablet (40 mg) by mouth daily when not going out - approximately 5 times week) 180 tablet 0   gabapentin  (NEURONTIN ) 100 MG capsule Take 100 mg by mouth 2 (two) times daily. Take with a 300 mg capsule for a total dose of 400 mg twice daily     gabapentin  (NEURONTIN ) 300 MG capsule Take 300 mg by mouth 2 (two) times daily. Take with a 100 mg capsule for a total dose of 400 mg twice daily     glucose blood (CONTOUR NEXT TEST) test strip Use to test blood sugar 2 times daily 100 each 12   glucose blood test strip Use as instructed 100 each 0   hydroxychloroquine  (PLAQUENIL ) 200 MG tablet Take 1 tablet (200 mg total) by mouth daily. 90 tablet 0   Insulin  Pen Needle 31G X 8 MM MISC 1 Device by Does not apply route 3 (three) times daily. 300 each 3   insulin  regular human CONCENTRATED (HUMULIN  R U-500 KWIKPEN) 500 UNIT/ML KwikPen Inject 50 Units into the skin daily with breakfast AND 36 Units daily before lunch AND 36 Units daily before supper. Max daily 200 units. 15 mL 6   levothyroxine  (SYNTHROID ) 75 MCG tablet TAKE 1 TABLET BY MOUTH EVERY DAY 90 tablet 0   lidocaine  (XYLOCAINE ) 5 % ointment Apply 1 application topically 2 (two) times daily as needed (pain).     metFORMIN  (GLUCOPHAGE ) 1000 MG tablet Take 1 tablet (1,000 mg total) by mouth daily  with breakfast. 90 tablet 1   methocarbamol  (ROBAXIN ) 750 MG tablet Take 1 tablet (750 mg total) by mouth 2 (two) times daily. (Patient taking differently: Take 750 mg by mouth every morning.) 60 tablet 5   Multiple Vitamin (MULTIVITAMIN WITH MINERALS) TABS tablet Take 1 tablet by mouth daily.     Multiple Vitamins-Minerals (AIRBORNE GUMMIES) CHEW Chew 3 tablets by mouth  every morning.     nitrofurantoin , macrocrystal-monohydrate, (MACROBID ) 100 MG capsule Take 1 capsule (100 mg total) by mouth 2 (two) times daily. 20 capsule 2   ondansetron  (ZOFRAN ) 4 MG tablet Take 1 tablet (4 mg total) by mouth as directed. Take one Zofran  pill 30-60 minutes before each colonoscopy prep dose 2 tablet 0   oseltamivir  (TAMIFLU ) 75 MG capsule Take 1 capsule (75 mg total) by mouth 2 (two) times daily. 10 capsule 0   pramipexole  (MIRAPEX ) 0.5 MG tablet TAKE 1 TABLET BY MOUTH EVERYDAY AT BEDTIME 90 tablet 0   rivaroxaban  (XARELTO ) 20 MG TABS tablet TAKE 1 TABLET BY MOUTH EVERY DAY 90 tablet 3   sertraline  (ZOLOFT ) 100 MG tablet TAKE 2 TABLETS BY MOUTH AT BEDTIME 180 tablet 0   tirzepatide  (MOUNJARO ) 2.5 MG/0.5ML Pen Inject 2.5 mg into the skin once a week. 2 mL 6   No current facility-administered medications on file prior to visit.    Allergies  Allergen Reactions   Vicodin [Hydrocodone -Acetaminophen ] Nausea And Vomiting       Physical Exam Vitals requested from patient and listed below if patient had equipment and was able to obtain at home for this virtual visit: There were no vitals filed for this visit. Estimated body mass index is 67.13 kg/m as calculated from the following:   Height as of 02/12/23: 5\' 7"  (1.702 m).   Weight as of 02/12/23: 428 lb 9.6 oz (194.4 kg).  EKG (optional): deferred due to virtual visit  GENERAL: alert, oriented, no acute distress detected, full vision exam deferred due to pandemic and/or virtual encounter  HEENT: atraumatic, conjunttiva clear, no obvious abnormalities on inspection of external nose and ears  NECK: normal movements of the head and neck  LUNGS: on inspection no signs of respiratory distress, breathing rate appears normal, no obvious gross SOB, gasping or wheezing  CV: no obvious cyanosis  MS: moves all visible extremities without noticeable abnormality  PSYCH/NEURO: pleasant and cooperative, no obvious depression or  anxiety, speech and thought processing grossly intact, Cognitive function grossly intact  Flowsheet Row Office Visit from 05/29/2022 in [facility] [place]  PHQ-9 Total Score 5           05/28/2023   10:14 AM 05/29/2022   10:20 AM 02/19/2022    3:15 PM 09/04/2021    9:40 AM 08/10/2021   10:30 AM  Depression screen PHQ 2/9  Decreased Interest 0 1 2 0 0  Down, Depressed, Hopeless 1 1 2  0 0  PHQ - 2 Score 1 2 4  0 0  Altered sleeping  1 2  0  Tired, decreased energy  1 2  0  Change in appetite  0 1  0  Feeling bad or failure about yourself   1 1  0  Trouble concentrating  0 0  0  Moving slowly or fidgety/restless  0 0  0  Suicidal thoughts  0 0  0  PHQ-9 Score  5 10  0  Difficult doing work/chores  Not difficult at all Somewhat difficult  Not difficult at all  Does  not feel is bad, not interrupting function, denies SI or any other symptoms. Does not feel needs counseling. Has super supportive family.      09/04/2021    9:39 AM 09/04/2021    5:58 PM 02/19/2022    3:15 PM 05/29/2022   10:20 AM 05/28/2023   10:09 AM  Fall Risk  Falls in the past year? 0  0 1 0  Was there an injury with Fall? 0  0 1 0  Fall Risk Category Calculator 0  0 2 0  Fall Risk Category (Retired) Low      (RETIRED) Patient Fall Risk Level Moderate fall risk Moderate fall risk     Patient at Risk for Falls Due to Impaired balance/gait;Impaired mobility;Medication side effect  No Fall Risks Impaired balance/gait;Impaired mobility;History of fall(s) Impaired balance/gait  Fall risk Follow up Falls evaluation completed;Education provided;Falls prevention discussed  Falls evaluation completed Falls evaluation completed Falls evaluation completed;Education provided     SUMMARY AND PLAN:  Encounter for Medicare annual wellness exam  Discussed applicable health maintenance/preventive health measures and advised and referred or ordered per patient preferences: -see patient instructions for  details -reports had a total hysterectomy so did not need further paps -she gets labs with her other providers and plans to do kidney check there -reports endo and rheum do foot exams -discussed vaccine recs/risks and advised could do at the pharmacy if wishes to do Health Maintenance  Topic Date Due   OPHTHALMOLOGY EXAM  Never done   Hepatitis C Screening  Never done   Zoster Vaccines- Shingrix (1 of 2) Never done   Cervical Cancer Screening (HPV/Pap Cotest)  Never done   Pneumococcal Vaccine 33-42 Years old (2 of 2 - PCV) 11/28/2013   Diabetic kidney evaluation - Urine ACR  11/05/2019   COVID-19 Vaccine (4 - 2024-25 season) 09/23/2022   FOOT EXAM  03/17/2023   Diabetic kidney evaluation - eGFR measurement  03/24/2023   HEMOGLOBIN A1C  08/12/2023   MAMMOGRAM  08/13/2023   INFLUENZA VACCINE  08/23/2023   Medicare Annual Wellness (AWV)  05/27/2024   Colonoscopy  10/11/2030   DTaP/Tdap/Td (2 - Td or Tdap) 05/14/2032   HIV Screening  Completed   HPV VACCINES  Aged Out   Meningococcal B Vaccine  Aged Out      Education and counseling on the following was provided based on the above review of health and a plan/checklist for the patient, along with additional information discussed, was provided for the patient in the patient instructions :  -Advised on importance of completing advanced directives, discussed options for completing and provided information in patient instructions as well -Provided counseling and plan for difficulty hearing  -Provided counseling and plan for increased risk of falling if applicable per above screening. Reviewed and demonstrated safe balance exercises that can be done at home to improve balance and discussed exercise guidelines for adults with include balance exercises at least 3 days per week.  -Advised and counseled on a healthy lifestyle - including the importance of a healthy diet, regular physical activity, social connections -she declined counseling as  feels has good support and feels is coping ok -Reviewed patient's current diet. Advised and counseled on a whole foods based healthy diet. Congratulated on recent changes. A summary of a healthy diet was provided in the Patient Instructions.  -reviewed patient's current physical activity level and discussed exercise guidelines for adults. Congratulated on the work she is doing! So wonderful. Further resources and ideas for safe  exercise at home to assist in meeting exercise guideline recommendations in a safe and healthy way provided in pt instructions. -Advise yearly dental visits at minimum and regular eye exams -Advised and counseled on opoid use/misuse - she is seeing pain management and reports is doing well.    Follow up: see patient instructions     Patient Instructions  I really enjoyed getting to talk with you today! I am available on Tuesdays and Thursdays for virtual visits if you have any questions or concerns, or if I can be of any further assistance.   CHECKLIST FROM ANNUAL WELLNESS VISIT:  -Follow up (please call to schedule if not scheduled after visit):   -yearly for annual wellness visit with primary care office  Here is a list of your preventive care/health maintenance measures and the plan for each if any are due:  PLAN For any measures below that may be due:   Health Maintenance  Topic Date Due   OPHTHALMOLOGY EXAM  Please request that your eye doctor send Dr. Alyne Babinski a copy of your diabetic eye exam report. Thank you!   Hepatitis C Screening  This can be done with labs if you ever wish to do - please request if you do.    Zoster Vaccines- Shingrix (1 of 2) No record of done.  All vaccines can be done at the pharmacy. Please request date, vaccine given and pharmacist who gave vaccine or record of vaccine to provide to Dr. Leonce Ralph office so that we can update your record. Thank you.    Cervical Cancer Screening (HPV/Pap Cotest)  Never done per record - please schedule pap  smear with Dr. Alyne Babinski or with your gynecologist. If already done please let us  know where this was done so that we can request the report fro your records. Thank you.    Pneumococcal Vaccine 68-42 Years old (2 of 2 - PCV) 11/28/2013 All vaccines can be done at the pharmacy. Please request date, vaccine given and pharmacist who gave vaccine or record of vaccine to provide to Dr. Leonce Ralph office so that we can update your record. Thank you.    Diabetic kidney evaluation - Urine ACR  11/05/2019, please request when you do your labs.    COVID-19 Vaccine (4 - 2024-25 season) 09/23/2022 All vaccines can be done at the pharmacy. Please request date, vaccine given and pharmacist who gave vaccine or record of vaccine to provide to Dr. Leonce Ralph office so that we can update your record. Thank you.    FOOT EXAM  03/17/2023, can be done at your next in office exam, please request   Diabetic kidney evaluation - eGFR measurement  03/24/2023, please request when you do your labs.    HEMOGLOBIN A1C  08/12/2023   MAMMOGRAM  08/13/2023   INFLUENZA VACCINE  08/23/2023   Medicare Annual Wellness (AWV)  05/27/2024   Colonoscopy  10/11/2030   DTaP/Tdap/Td (2 - Td or Tdap) 05/14/2032   HIV Screening  Completed   HPV VACCINES  Aged Out   Meningococcal B Vaccine  Aged Out    -See a dentist at least yearly  -Get your eyes checked and then per your eye specialist's recommendations  -Other issues addressed today:   -I have included below further information regarding a healthy whole foods based diet, physical activity guidelines for adults, stress management and opportunities for social connections. I hope you find this information useful.   -----------------------------------------------------------------------------------------------------------------------------------------------------------------------------------------------------------------------------------------------------------    NUTRITION: -eat real food:  lots of colorful vegetables (  half the plate) and fruits -5-7 servings of vegetables and fruits per day (fresh or steamed is best), exp. 2 servings of vegetables with lunch and dinner and 2 servings of fruit per day. Berries and greens such as kale and collards are great choices.  -consume on a regular basis:  fresh fruits, fresh veggies, fish, nuts, seeds, healthy oils (such as olive oil, avocado oil), whole grains (make sure for bread/pasta/crackers/etc., that the first ingredient on label contains the word "whole"), legumes. -can eat small amounts of dairy and lean meat (no larger than the palm of your hand), but avoid processed meats such as ham, bacon, lunch meat, etc. -drink water -try to avoid fast food and pre-packaged foods, processed meat, ultra processed foods/beverages (donuts, candy, etc.) -most experts advise limiting sodium to < 2300mg  per day, should limit further is any chronic conditions such as high blood pressure, heart disease, diabetes, etc. The American Heart Association advised that < 1500mg  is is ideal -try to avoid foods/beverages that contain any ingredients with names you do not recognize  -try to avoid foods/beverages  with added sugar or sweeteners/sweets  -try to avoid sweet drinks (including diet drinks): soda, juice, Gatorade, sweet tea, power drinks, diet drinks -try to avoid white rice, white bread, pasta (unless whole grain)  EXERCISE GUIDELINES FOR ADULTS: -if you wish to increase your physical activity, do so gradually and with the approval of your doctor -STOP and seek medical care immediately if you have any chest pain, chest discomfort or trouble breathing when starting or increasing exercise  -move and stretch your body, legs, feet and arms when sitting for long periods -Physical activity guidelines for optimal health in adults: -get at least 150 minutes per week of moderate exercise (can talk, but not sing); this is about 20-30 minutes of sustained activity  5-7 days per week or two 10-15 minute episodes of sustained activity 5-7 days per week -do some muscle building/resistance training/strength training at least 2 days per week  -balance exercises 3+ days per week:   Stand somewhere where you have something sturdy to hold onto if you lose balance    1) lift up on toes, then back down, start with 5x per day and work up to 20x   2) stand and lift one leg straight out to the side so that foot is a few inches of the floor, start with 5x each side and work up to 20x each side   3) stand on one foot, start with 5 seconds each side and work up to 20 seconds on each side  If you need ideas or help with getting more active:  -Silver sneakers https://tools.silversneakers.com  -Walk with a Doc: http://www.duncan-williams.com/  -try to include resistance (weight lifting/strength building) and balance exercises twice per week: or the following link for ideas: http://castillo-powell.com/  BuyDucts.dk  STRESS MANAGEMENT: -can try meditating, or just sitting quietly with deep breathing while intentionally relaxing all parts of your body for 5 minutes daily -if you need further help with stress, anxiety or depression please follow up with your primary doctor or contact the wonderful folks at [facility]: [phone]  SOCIAL CONNECTIONS: -options in [place] if you wish to engage in more social and exercise related activities:  -Silver sneakers https://tools.silversneakers.com  -Walk with a Doc: http://www.duncan-williams.com/  -Check out the [facility] Active Adults 50+ section on the [place] of [employer] (hiking clubs, book clubs, cards and games, chess, exercise classes, aquatic classes and much more) - see the website  for details: https://www.[place].gov/departments/parks-recreation/active-adults50  -YouTube has lots of exercise videos for  different ages and abilities as well  -[facility] (a variety of indoor and outdoor inperson activities for adults). [phone]. [address]  -Virtual Online Classes (a variety of topics): see seniorplanet.org or call [phone]  -consider volunteering at a school, hospice center, church, senior center or elsewhere          ADVANCED HEALTHCARE DIRECTIVES:  [place] Advanced Directives assistance:   ExpressWeek.com.cy  Everyone should have advanced health care directives in place. This is so that you get the care you want, should you ever be in a situation where you are unable to make your own medical decisions.   From the [place] Advanced Directive Website: "Advance Health Care Directives are legal documents in which you give written instructions about your health care if, in the future, you cannot speak for yourself.   A health care power of attorney allows you to name a person you trust to make your health care decisions if you cannot make them yourself. A declaration of a desire for a natural death (or living will) is document, which states that you desire not to have your life prolonged by extraordinary measures if you have a terminal or incurable illness or if you are in a vegetative state. An advance instruction for mental health treatment makes a declaration of instructions, information and preferences regarding your mental health treatment. It also states that you are aware that the advance instruction authorizes a mental health treatment provider to act according to your wishes. It may also outline your consent or refusal of mental health treatment. A declaration of an anatomical gift allows anyone over the age of 73 to make a gift by will, organ donor card or other document."   Please see the following website or an elder law attorney for forms, FAQs and for completion of advanced directives: [place]  TEFL teacher Health Care Directives Advance Health Care Directives ([email or web address]/)  Or copy and paste the following to your web browser: PoshChat.fi    Maurie Southern, DO

## 2023-07-30 ENCOUNTER — Other Ambulatory Visit: Payer: Self-pay | Admitting: Internal Medicine

## 2023-07-30 ENCOUNTER — Other Ambulatory Visit: Payer: Self-pay | Admitting: Family Medicine

## 2023-08-13 DIAGNOSIS — M7918 Myalgia, other site: Secondary | ICD-10-CM | POA: Diagnosis not present

## 2023-08-13 DIAGNOSIS — Z79899 Other long term (current) drug therapy: Secondary | ICD-10-CM | POA: Diagnosis not present

## 2023-08-13 DIAGNOSIS — M069 Rheumatoid arthritis, unspecified: Secondary | ICD-10-CM | POA: Diagnosis not present

## 2023-08-13 DIAGNOSIS — G894 Chronic pain syndrome: Secondary | ICD-10-CM | POA: Diagnosis not present

## 2023-08-14 ENCOUNTER — Other Ambulatory Visit: Payer: Self-pay | Admitting: Family Medicine

## 2023-08-15 ENCOUNTER — Ambulatory Visit: Payer: Medicare Other | Admitting: Internal Medicine

## 2023-08-15 ENCOUNTER — Encounter: Payer: Self-pay | Admitting: Internal Medicine

## 2023-08-15 VITALS — BP 124/76 | HR 85 | Ht 67.0 in | Wt >= 6400 oz

## 2023-08-15 DIAGNOSIS — E785 Hyperlipidemia, unspecified: Secondary | ICD-10-CM

## 2023-08-15 DIAGNOSIS — E1165 Type 2 diabetes mellitus with hyperglycemia: Secondary | ICD-10-CM | POA: Diagnosis not present

## 2023-08-15 DIAGNOSIS — E1142 Type 2 diabetes mellitus with diabetic polyneuropathy: Secondary | ICD-10-CM

## 2023-08-15 DIAGNOSIS — Z794 Long term (current) use of insulin: Secondary | ICD-10-CM | POA: Diagnosis not present

## 2023-08-15 LAB — POCT GLYCOSYLATED HEMOGLOBIN (HGB A1C): Hemoglobin A1C: 7.9 % — AB (ref 4.0–5.6)

## 2023-08-15 MED ORDER — METFORMIN HCL ER 500 MG PO TB24: 500.0000 mg | ORAL_TABLET | Freq: Every day | ORAL | 3 refills | Status: DC

## 2023-08-15 MED ORDER — TIRZEPATIDE 5 MG/0.5ML ~~LOC~~ SOAJ: 5.0000 mg | SUBCUTANEOUS | 3 refills | Status: DC

## 2023-08-15 NOTE — Progress Notes (Unsigned)
 Name: Cheyenne Guzman  Age/ Sex: 58 y.o., female   MRN/ DOB: [id number], Oct 15, 1965     PCP: Johnny Garnette LABOR, MD   Reason for Endocrinology Evaluation: Type 2 Diabetes Mellitus  Initial Endocrine Consultative Visit: 08/06/2016    PATIENT IDENTIFIER: Cheyenne Guzman is a 58 y.o. female with a past medical history of T2DM, HTN, OSA. The patient has followed with Endocrinology clinic since 08/06/2016 for consultative assistance with management of her diabetes.     DIABETIC HISTORY:  [name] was diagnosed with T2DM in 2000. Has been on Metformin  without side effects  .Has been on insulin  since 2009. Her hemoglobin A1c has ranged from 8.2% in 2017, peaking at 10.3% in 2019.  Re-established with me 08/2018. Stopped lantus  and started Novolog  mix as well as metformin .   Attempted to prescribe Ozempic  in 2021 but it was cost prohibitive  By September 2022 we will switch Humalog  mix to Humulin  U-500   Started Mounjaro  10/2021, developed intolerance to Mounjaro  10 mg 02/2023   SUBJECTIVE:    Today (08/15/2023): [name] is here for a follow up on diabetes management . She checks her glucose multiple times daily through CGM technology.  She has not been noted with hypoglycemia. She  Is symptomatic with these visit   She continues to follow-up with rheumatology for rheumatoid arthritis She follows with [facility] for rheumatoid arthritis, on bupropion and gabapentin   Has chronic nausea but no vomiting  Has occasional heartburn  Has chronic bowel changes due to pain meds    HOME REGIMEN:  Metformin  1000 mg, with breakfast  Mounjaro  2.5 mg weekly  Humulin  U-500 , 90 units with breakfast,  30-50 units with lunch, and 30-50 units with supper Lipitor 10 mg daily      Statin: yes ACE-I/ARB: no Prior Diabetic Education: Yes   CONTINUOUS GLUCOSE MONITORING RECORD INTERPRETATION    Dates of Recording: 7/11-7/24/2025  Sensor description: Dexcom  Results  statistics:   CGM use % of time 94  Average and SD 196/49  Time in range 39  %  % Time Above 180 48  % Time above 250 13  % Time Below target 0   Glycemic patterns summary: BG's are above goal throughout the day and night  Hyperglycemic episodes postprandial  Hypoglycemic episodes occurred N/A   Overnight periods: Fluctuate   DIABETIC COMPLICATIONS: Microvascular complications:  Neuropathy, ? Mild retinopathy  Denies: CKD Last Eye Exam: Completed 11/2020  Macrovascular complications:  TIA Denies: CAD, CVA, PVD   HISTORY:  Past Medical History:  Past Medical History:  Diagnosis Date   Anxiety    per pt. - mild    Arthritis    back & ankles    Asthma    seasonal    Breast cancer (HCC)    Burning sensation of mouth 10/07/2020   Cancer (HCC)    breast, sees Dr. Layla , Left - 11/2007   Candida vaginitis 10/07/2020   Cellulitis 2020   severe - Left leg    Complication of anesthesia 2013   severe muscle spasms-sore-no doc   Depression    Diabetes mellitus    sees Dr. Tommas, diagnosed 2003   HA (headache)    Hearing loss    Went to [facility],Dr Laing,both ears   Hyperlipidemia    Kidney infection    - hosp. [facility]- septic- 10/2009   Low back pain    Muscle tear 10/07/2020   Peripheral neuropathy  R sided numbness- face & jaw   Pulmonary embolism (HCC) 2020   Sepsis (HCC) 2020   Sleep apnea 09/2013   severe-just started cpap-doing well   Sore throat 10/07/2020   TIA (transient ischemic attack)    in 2007,2008, and 2011   Tinnitus of right ear    Urosepsis 08/2009   with Klebsiella   Past Surgical History:  Past Surgical History:  Procedure Laterality Date   ABDOMINAL HYSTERECTOMY  08/2004   BREAST BIOPSY  06/22/2011   Procedure: BREAST BIOPSY;  Surgeon: Jina Nephew, MD;  Location: [facility] OR;  Service: General;  Laterality: Right;   BREAST LUMPECTOMY     BREAST SURGERY  11/2007   left ductal carcinoma in situ   CARPAL TUNNEL  RELEASE Right 10/13/2013   Procedure: RIGHT CARPAL TUNNEL RELEASE;  Surgeon: Maude KANDICE Herald, MD;  Location: [facility];  Service: Orthopedics;  Laterality: Right;   CARPAL TUNNEL RELEASE Left 11/24/2013   Procedure: LEFT CARPAL TUNNEL RELEASE WITH CYST EXCISION ;  Surgeon: Maude KANDICE Herald, MD;  Location: [facility];  Service: Orthopedics;  Laterality: Left;   COLONOSCOPY WITH PROPOFOL  N/A 10/10/2020   Procedure: COLONOSCOPY WITH PROPOFOL ;  Surgeon: Leigh Elspeth SQUIBB, MD;  Location: [facility];  Service: Gastroenterology;  Laterality: N/A;   EAR CYST EXCISION Left 11/24/2013   Procedure: CYST REMOVAL;  Surgeon: Maude KANDICE Herald, MD;  Location:  SURGERY CENTER;  Service: Orthopedics;  Laterality: Left;   FOOT SURGERY  2000   plantar fasciitis-left   LUMBAR DISC SURGERY  03-29-14   Fusion, revision 04-27-14 L4-5 Cauda Equina with graft   NERVE GRAFT  06-01-08   cadaver graft to right inferior alveolar nerve  in [place]  -mouth   POLYPECTOMY  10/10/2020   Procedure: POLYPECTOMY;  Surgeon: Leigh Elspeth SQUIBB, MD;  Location: [facility];  Service: Gastroenterology;;   Social History:  reports that she quit smoking about 31 years ago. Her smoking use included cigarettes. She has never used smokeless tobacco. She reports that she does not currently use alcohol. She reports that she does not use drugs. Family History:  Family History  Problem Relation Age of Onset   Cancer Mother        breast   Cancer Father        lung   Diabetes Father    Colon cancer Sister    Cancer Sister 85       colon   Diabetes Sister    Cancer Maternal Aunt        breast - both   Anesthesia problems Neg Hx      HOME MEDICATIONS: Allergies as of 08/15/2023       Reactions   Vicodin [hydrocodone -acetaminophen ] Nausea And Vomiting        Medication List        Accurate as of August 15, 2023 10:16 AM. If you have any questions, ask your nurse or doctor.           Airborne Gummies Chew Chew 3 tablets by mouth every morning.   albuterol  (2.5 MG/3ML) 0.083% nebulizer solution Commonly known as: PROVENTIL  Take 3 mLs (2.5 mg total) by nebulization every 4 (four) hours as needed for wheezing or shortness of breath.   albuterol  108 (90 Base) MCG/ACT inhaler Commonly known as: VENTOLIN  HFA INHALE 2 PUFFS INTO THE LUNGS EVERY 4 HOURS AS NEEDED FOR WHEEZING OR SHORTNESS OF BREATH.   atorvastatin  10 MG tablet Commonly known as: LIPITOR Take 1  tablet (10 mg total) by mouth daily.   buprenorphine  15 MCG/HR Commonly known as: BUTRANS  Place 1 patch onto the skin every Monday. Uses 20 mcg at this time   Contour Next Monitor w/Device Kit 1 each by Does not apply route 2 (two) times daily. To check blood sugars twice a day.   Dexcom G7 Receiver Devi Use to check blood sugar. E11.65   Dexcom G7 Sensor Misc CHANGE SENSORS EVERY 10 DAYS   fluconazole  150 MG tablet Commonly known as: DIFLUCAN  Take 1 tablet (150 mg total) by mouth daily as needed.   furosemide  40 MG tablet Commonly known as: LASIX  TAKE 1 TABLET BY MOUTH TWICE A DAY What changed:  when to take this additional instructions   gabapentin  300 MG capsule Commonly known as: NEURONTIN  Take 300 mg by mouth 2 (two) times daily. Take with a 100 mg capsule for a total dose of 400 mg twice daily   gabapentin  100 MG capsule Commonly known as: NEURONTIN  Take 100 mg by mouth 2 (two) times daily. Take with a 300 mg capsule for a total dose of 400 mg twice daily   glucose blood test strip Use as instructed   glucose blood test strip Commonly known as: Contour Next Test Use to test blood sugar 2 times daily   HumuLIN  R U-500 KwikPen 500 UNIT/ML KwikPen Generic drug: insulin  regular human CONCENTRATED Inject 50 Units into the skin daily with breakfast AND 36 Units daily before lunch AND 36 Units daily before supper. Max daily 200 units.   hydroxychloroquine  200 MG tablet Commonly known  as: PLAQUENIL  Take 1 tablet (200 mg total) by mouth daily.   Insulin  Pen Needle 31G X 8 MM Misc 1 Device by Does not apply route 3 (three) times daily.   levothyroxine  75 MCG tablet Commonly known as: SYNTHROID  TAKE 1 TABLET BY MOUTH EVERY DAY   lidocaine  5 % ointment Commonly known as: XYLOCAINE  Apply 1 application topically 2 (two) times daily as needed (pain).   metFORMIN  1000 MG tablet Commonly known as: GLUCOPHAGE  Take 1 tablet (1,000 mg total) by mouth daily with breakfast.   methocarbamol  750 MG tablet Commonly known as: ROBAXIN  Take 1 tablet (750 mg total) by mouth 2 (two) times daily. What changed: when to take this   multivitamin with minerals Tabs tablet Take 1 tablet by mouth daily.   nitrofurantoin  (macrocrystal-monohydrate) 100 MG capsule Commonly known as: Macrobid  Take 1 capsule (100 mg total) by mouth 2 (two) times daily.   ondansetron  4 MG tablet Commonly known as: ZOFRAN  Take 1 tablet (4 mg total) by mouth as directed. Take one Zofran  pill 30-60 minutes before each colonoscopy prep dose   oseltamivir  75 MG capsule Commonly known as: Tamiflu  Take 1 capsule (75 mg total) by mouth 2 (two) times daily.   pramipexole  0.5 MG tablet Commonly known as: MIRAPEX  TAKE 1 TABLET BY MOUTH EVERYDAY AT BEDTIME   sertraline  100 MG tablet Commonly known as: ZOLOFT  TAKE 2 TABLETS BY MOUTH AT BEDTIME   tirzepatide  2.5 MG/0.5ML Pen Commonly known as: MOUNJARO  Inject 2.5 mg into the skin once a week.   Vitamin D3 50 MCG (2000 UT) Tabs Take 2,000 Units by mouth every morning.   Xarelto  20 MG Tabs tablet Generic drug: rivaroxaban  TAKE 1 TABLET BY MOUTH EVERY DAY         OBJECTIVE:   Vital Signs: BP 124/76 (BP Location: Left Arm, Patient Position: Sitting, Cuff Size: Normal)   Pulse 85   Ht 5'  7 (1.702 m)   Wt (!) 450 lb (204.1 kg)   SpO2 95%   BMI 70.48 kg/m   Wt Readings from Last 3 Encounters:  08/15/23 (!) 450 lb (204.1 kg)  02/12/23 (!) 428 lb  9.6 oz (194.4 kg)  03/16/22 (!) 429 lb (194.6 kg)     Exam: General: Pt appears well and is in NAD, in a wheelchair  Lungs: Clear with good BS bilat   Heart: RRR  Extremities: Trace edema   Neuro: MS is good with appropriate affect, pt is alert and Ox3   DM Foot Exam 08/15/2023  The skin of the feet is intact without sores or ulcerations. The pedal pulses are 1+ on right and 1+ on left. The sensation is decreased  to a screening 5.07, 10 gram monofilament bilaterally    DATA REVIEWED:  Lab Results  Component Value Date   HGBA1C 7.2 (A) 02/12/2023   HGBA1C 7.2 (A) 03/16/2022   HGBA1C 8.8 (A) 11/08/2021     ASSESSMENT / PLAN / RECOMMENDATIONS:   1) Type 2 Diabetes Mellitus, Optimally  controlled, With retinopathic and neuropathic complications - Most recent A1c of 7.9 %. Goal A1c < 7.0 %.   -A1c has trended up, this is attributed to decreasing dose of Mounjaro  due to intolerance to the 10 mg dose - We will increase Mounjaro  as below and avoid the 10 mg dose - I have also recommended switching metformin  from regular release to extended formula  MEDICATIONS:  -Change Humulin  U-500 at 90 units before breakfast, 33-55 units before lunch and 35-55 units before supper - Switch metformin  750XR mg daily beakfast  -Increase Mounjaro  5 mg weekly   EDUCATION / INSTRUCTIONS: BG monitoring instructions: Patient is instructed to check her blood sugars 3 times a day, before each meal Call  Endocrinology clinic if: BG persistently < 70  I reviewed the Rule of 15 for the treatment of hypoglycemia in detail with the patient. Literature supplied.    2) Diabetic complications:  Eye: Does have known diabetic retinopathy. Per pt (mild) Neuro/ Feet: Does have known diabetic peripheral neuropathy .  Renal: Patient does not have known baseline CKD. She   is not on an ACEI/ARB at present.   3) Dyslipidemia:  -****    Medication Continue atorvastatin  10 mg daily     F/U  in 4 months     Signed electronically by: Stefano Redgie Butts, MD  [facility] Endocrinology  [facility] [address] [place] [place], [place] [address] Phone: [phone] FAX: [phone]   CC: Johnny Garnette LABOR, MD [address] [place] [address] Phone: [phone]  Fax: [phone]  Return to Endocrinology clinic as below: No future appointments.

## 2023-08-15 NOTE — Patient Instructions (Addendum)
-   Change Humulin  U-500  90 units with Breakfast and 35-55 units with Lunch and 35-55 units with Supper  - Change  Metformin  750 mg, daily  - Increase Mounjaro   5 mg once weekly     HOW TO TREAT LOW BLOOD SUGARS (Blood sugar LESS THAN 70 MG/DL) Please follow the RULE OF 15 for the treatment of hypoglycemia treatment (when your (blood sugars are less than 70 mg/dL)   STEP 1: Take 15 grams of carbohydrates when your blood sugar is low, which includes:  3-4 GLUCOSE TABS  OR 3-4 OZ OF JUICE OR REGULAR SODA OR ONE TUBE OF GLUCOSE GEL    STEP 2: RECHECK blood sugar in 15 MINUTES STEP 3: If your blood sugar is still low at the 15 minute recheck --> then, go back to STEP 1 and treat AGAIN with another 15 grams of carbohydrates.

## 2023-08-16 ENCOUNTER — Ambulatory Visit: Payer: Self-pay | Admitting: Internal Medicine

## 2023-08-16 ENCOUNTER — Encounter: Payer: Self-pay | Admitting: Internal Medicine

## 2023-08-16 LAB — BASIC METABOLIC PANEL WITH GFR
BUN: 14 mg/dL (ref 7–25)
CO2: 29 mmol/L (ref 20–32)
Calcium: 8.7 mg/dL (ref 8.6–10.4)
Chloride: 101 mmol/L (ref 98–110)
Creat: 1.03 mg/dL (ref 0.50–1.03)
Glucose, Bld: 201 mg/dL — ABNORMAL HIGH (ref 65–99)
Potassium: 4.4 mmol/L (ref 3.5–5.3)
Sodium: 139 mmol/L (ref 135–146)
eGFR: 63 mL/min/1.73m2 (ref >=60)

## 2023-08-16 LAB — LIPID PANEL
Cholesterol: 151 mg/dL (ref <200)
HDL: 51 mg/dL (ref >=50)
LDL Cholesterol (Calc): 81 mg/dL
Non-HDL Cholesterol (Calc): 100 mg/dL (ref <130)
Total CHOL/HDL Ratio: 3 (calc) (ref <5.0)
Triglycerides: 95 mg/dL (ref <150)

## 2023-08-16 MED ORDER — METFORMIN HCL ER 750 MG PO TB24: 750.0000 mg | ORAL_TABLET | Freq: Every day | ORAL | 3 refills | Status: DC

## 2023-10-10 ENCOUNTER — Other Ambulatory Visit: Payer: Self-pay | Admitting: Family Medicine

## 2023-10-11 ENCOUNTER — Other Ambulatory Visit: Payer: Self-pay | Admitting: Family Medicine

## 2023-10-11 NOTE — Telephone Encounter (Signed)
 Copied from CRM #[id number]. Topic: Clinical - Medication Refill >> Oct 11, 2023  2:09 PM Chiquita SQUIBB wrote: Medication: Xarelto  rivaroxaban  (XARELTO ) 20 MG TABS tablet   Has the patient contacted their pharmacy? Yes (Agent: If no, request that the patient contact the pharmacy for the refill. If patient does not wish to contact the pharmacy document the reason why and proceed with request.) (Agent: If yes, when and what did the pharmacy advise?)  This is the patient's preferred pharmacy:  [id number] [place], [place] - [address] [place] [address] Phone: [phone] Fax: [phone]  Is this the correct pharmacy for this prescription? Yes If no, delete pharmacy and type the correct one.   Has the prescription been filled recently? No  Is the patient out of the medication? Yes  Has the patient been seen for an appointment in the last year OR does the patient have an upcoming appointment? Yes- Patient is scheduled for 09/25  Can we respond through MyChart? Yes  Agent: Please be advised that Rx refills may take up to 3 business days. We ask that you follow-up with your pharmacy.

## 2023-10-14 MED ORDER — RIVAROXABAN 20 MG PO TABS: 20.0000 mg | ORAL_TABLET | Freq: Every day | ORAL | 3 refills | Status: AC

## 2023-10-17 ENCOUNTER — Ambulatory Visit: Admitting: Family Medicine

## 2023-10-17 ENCOUNTER — Encounter: Payer: Self-pay | Admitting: Family Medicine

## 2023-10-17 ENCOUNTER — Telehealth: Admitting: Family Medicine

## 2023-10-17 DIAGNOSIS — Z86711 Personal history of pulmonary embolism: Secondary | ICD-10-CM

## 2023-10-17 DIAGNOSIS — E039 Hypothyroidism, unspecified: Secondary | ICD-10-CM

## 2023-10-17 MED ORDER — LEVOTHYROXINE SODIUM 75 MCG PO TABS: 75.0000 ug | ORAL_TABLET | Freq: Every day | ORAL | 3 refills | Status: AC

## 2023-10-17 NOTE — Progress Notes (Signed)
 Subjective:    Patient ID: Cheyenne Guzman, female    DOB: 05-25-1965, 58 y.o.   MRN: [id number]  HPI Virtual Visit via Video Note  I connected with the patient on 10/17/23 at 10:45 AM EDT by a video enabled telemedicine application and verified that I am speaking with the correct person using two identifiers.  Location patient: home Location provider:work or home office Persons participating in the virtual visit: patient, provider  I discussed the limitations of evaluation and management by telemedicine and the availability of in person appointments. The patient expressed understanding and agreed to proceed.   HPI: Here asking for refills on Xarelto  and Levothyroxine . She has been doing well. She sees Dr. Sam for her diabetes. She goes to the [facility].    ROS: See pertinent positives and negatives per HPI.  Past Medical History:  Diagnosis Date   Anxiety    per pt. - mild    Arthritis    back & ankles    Asthma    seasonal    Breast cancer (HCC)    Burning sensation of mouth 10/07/2020   Cancer (HCC)    breast, sees Dr. Layla , Left - 11/2007   Candida vaginitis 10/07/2020   Cellulitis 2020   severe - Left leg    Complication of anesthesia 2013   severe muscle spasms-sore-no doc   Depression    Diabetes mellitus    sees Dr. Tommas, diagnosed 2003   HA (headache)    Hearing loss    Went to [facility],Dr Laing,both ears   Hyperlipidemia    Kidney infection    - hosp. [facility]- septic- 10/2009   Low back pain    Muscle tear 10/07/2020   Peripheral neuropathy    R sided numbness- face & jaw   Pulmonary embolism (HCC) 2020   Sepsis (HCC) 2020   Sleep apnea 09/2013   severe-just started cpap-doing well   Sore throat 10/07/2020   TIA (transient ischemic attack)    in 2007,2008, and 2011   Tinnitus of right ear    Urosepsis 08/2009   with Klebsiella    Past Surgical History:  Procedure Laterality Date   ABDOMINAL  HYSTERECTOMY  08/2004   BREAST BIOPSY  06/22/2011   Procedure: BREAST BIOPSY;  Surgeon: Jina Nephew, MD;  [facility];  Service: General;  Laterality: Right;   BREAST LUMPECTOMY     BREAST SURGERY  11/2007   left ductal carcinoma in situ   CARPAL TUNNEL RELEASE Right 10/13/2013   Procedure: RIGHT CARPAL TUNNEL RELEASE;  Surgeon: Maude KANDICE Herald, MD;  Location: [facility];  Service: Orthopedics;  Laterality: Right;   CARPAL TUNNEL RELEASE Left 11/24/2013   Procedure: LEFT CARPAL TUNNEL RELEASE WITH CYST EXCISION ;  Surgeon: Maude KANDICE Herald, MD;  Location: [facility];  Service: Orthopedics;  Laterality: Left;   COLONOSCOPY WITH PROPOFOL  N/A 10/10/2020   Procedure: COLONOSCOPY WITH PROPOFOL ;  Surgeon: Leigh Elspeth SQUIBB, MD;  Location: [facility];  Service: Gastroenterology;  Laterality: N/A;   EAR CYST EXCISION Left 11/24/2013   Procedure: CYST REMOVAL;  Surgeon: Maude KANDICE Herald, MD;  Location:  SURGERY CENTER;  Service: Orthopedics;  Laterality: Left;   FOOT SURGERY  2000   plantar fasciitis-left   LUMBAR DISC SURGERY  03-29-14   Fusion, revision 04-27-14 L4-5 Cauda Equina with graft   NERVE GRAFT  06-01-08   cadaver graft to right inferior alveolar nerve  in [place]  -mouth   POLYPECTOMY  10/10/2020   Procedure: POLYPECTOMY;  Surgeon: Leigh Elspeth SQUIBB, MD;  Location: [facility];  Service: Gastroenterology;;    Family History  Problem Relation Age of Onset   Cancer Mother        breast   Cancer Father        lung   Diabetes Father    Colon cancer Sister    Cancer Sister 31       colon   Diabetes Sister    Cancer Maternal Aunt        breast - both   Anesthesia problems Neg Hx      Current Outpatient Medications:    albuterol  (PROVENTIL ) (2.5 MG/3ML) 0.083% nebulizer solution, Take 3 mLs (2.5 mg total) by nebulization every 4 (four) hours as needed for wheezing or shortness of breath., Disp: 75 mL, Rfl: 1   albuterol  (VENTOLIN  HFA)  108 (90 Base) MCG/ACT inhaler, INHALE 2 PUFFS INTO THE LUNGS EVERY 4 HOURS AS NEEDED FOR WHEEZING OR SHORTNESS OF BREATH., Disp: 20.1 each, Rfl: 1   atorvastatin  (LIPITOR) 10 MG tablet, Take 1 tablet (10 mg total) by mouth daily., Disp: 90 tablet, Rfl: 3   Blood Glucose Monitoring Suppl (CONTOUR NEXT MONITOR) w/Device KIT, 1 each by Does not apply route 2 (two) times daily. To check blood sugars twice a day., Disp: 1 kit, Rfl: 0   Buprenorphine  15 MCG/HR PTWK, Place 1 patch onto the skin every Monday. Uses 20 mcg at this time, Disp: , Rfl: 0   Cholecalciferol  (VITAMIN D3) 50 MCG (2000 UT) TABS, Take 2,000 Units by mouth every morning., Disp: , Rfl:    Continuous Blood Gluc Receiver (DEXCOM G7 RECEIVER) DEVI, Use to check blood sugar. E11.65, Disp: 1 each, Rfl: 0   Continuous Glucose Sensor (DEXCOM G7 SENSOR) MISC, CHANGE SENSORS EVERY 10 DAYS, Disp: 9 each, Rfl: 3   furosemide  (LASIX ) 40 MG tablet, TAKE 1 TABLET BY MOUTH TWICE A DAY (Patient taking differently: Take 40 mg by mouth See admin instructions. Take one tablet (40 mg) by mouth daily when not going out - approximately 5 times week), Disp: 180 tablet, Rfl: 0   gabapentin  (NEURONTIN ) 100 MG capsule, Take 100 mg by mouth 2 (two) times daily. Take with a 300 mg capsule for a total dose of 400 mg twice daily, Disp: , Rfl:    gabapentin  (NEURONTIN ) 300 MG capsule, Take 300 mg by mouth 2 (two) times daily. Take with a 100 mg capsule for a total dose of 400 mg twice daily, Disp: , Rfl:    glucose blood (CONTOUR NEXT TEST) test strip, Use to test blood sugar 2 times daily, Disp: 100 each, Rfl: 12   glucose blood test strip, Use as instructed, Disp: 100 each, Rfl: 0   hydroxychloroquine  (PLAQUENIL ) 200 MG tablet, Take 1 tablet (200 mg total) by mouth daily., Disp: 90 tablet, Rfl: 0   Insulin  Pen Needle 31G X 8 MM MISC, 1 Device by Does not apply route 3 (three) times daily., Disp: 300 each, Rfl: 3   insulin  regular human CONCENTRATED (HUMULIN  R U-500  KWIKPEN) 500 UNIT/ML KwikPen, Inject 50 Units into the skin daily with breakfast AND 36 Units daily before lunch AND 36 Units daily before supper. Max daily 200 units., Disp: 15 mL, Rfl: 6   levothyroxine  (SYNTHROID ) 75 MCG tablet, TAKE 1 TABLET BY MOUTH EVERY DAY, Disp: 90 tablet, Rfl: 0   lidocaine  (XYLOCAINE ) 5 % ointment, Apply 1 application topically  2 (two) times daily as needed (pain)., Disp: , Rfl:    metFORMIN  (GLUCOPHAGE -XR) 750 MG 24 hr tablet, Take 1 tablet (750 mg total) by mouth daily with breakfast., Disp: 90 tablet, Rfl: 3   methocarbamol  (ROBAXIN ) 750 MG tablet, Take 1 tablet (750 mg total) by mouth 2 (two) times daily. (Patient taking differently: Take 750 mg by mouth every morning.), Disp: 60 tablet, Rfl: 5   Multiple Vitamin (MULTIVITAMIN WITH MINERALS) TABS tablet, Take 1 tablet by mouth daily., Disp: , Rfl:    Multiple Vitamins-Minerals (AIRBORNE GUMMIES) CHEW, Chew 3 tablets by mouth every morning., Disp: , Rfl:    ondansetron  (ZOFRAN ) 4 MG tablet, Take 1 tablet (4 mg total) by mouth as directed. Take one Zofran  pill 30-60 minutes before each colonoscopy prep dose, Disp: 2 tablet, Rfl: 0   pramipexole  (MIRAPEX ) 0.5 MG tablet, TAKE 1 TABLET BY MOUTH EVERYDAY AT BEDTIME, Disp: 90 tablet, Rfl: 0   rivaroxaban  (XARELTO ) 20 MG TABS tablet, Take 1 tablet (20 mg total) by mouth daily., Disp: 90 tablet, Rfl: 3   sertraline  (ZOLOFT ) 100 MG tablet, TAKE 2 TABLETS BY MOUTH AT BEDTIME, Disp: 180 tablet, Rfl: 0   tirzepatide  (MOUNJARO ) 5 MG/0.5ML Pen, Inject 5 mg into the skin once a week., Disp: 6 mL, Rfl: 3   fluconazole  (DIFLUCAN ) 150 MG tablet, Take 1 tablet (150 mg total) by mouth daily as needed. (Patient not taking: Reported on 08/15/2023), Disp: 10 tablet, Rfl: 3  EXAM:  VITALS per patient if applicable:  GENERAL: alert, oriented, appears well and in no acute distress  HEENT: atraumatic, conjunttiva clear, no obvious abnormalities on inspection of external nose and ears  NECK:  normal movements of the head and neck  LUNGS: on inspection no signs of respiratory distress, breathing rate appears normal, no obvious gross SOB, gasping or wheezing  CV: no obvious cyanosis  MS: moves all visible extremities without noticeable abnormality  PSYCH/NEURO: pleasant and cooperative, no obvious depression or anxiety, speech and thought processing grossly intact  ASSESSMENT AND PLAN: For the hypothyroidism, we will refill the Levothyroxine , but she needs to have her levels checked. I asked her to come in for a physical exam and complete lab work sometime soon. For the hx of PE, we refilled the Xarelto .  Garnette Olmsted, MD  Discussed the following assessment and plan:  No diagnosis found.     I discussed the assessment and treatment plan with the patient. The patient was provided an opportunity to ask questions and all were answered. The patient agreed with the plan and demonstrated an understanding of the instructions.   The patient was advised to call back or seek an in-person evaluation if the symptoms worsen or if the condition fails to improve as anticipated.      Review of Systems     Objective:   Physical Exam        Assessment & Plan:

## 2023-10-24 ENCOUNTER — Other Ambulatory Visit: Payer: Self-pay | Admitting: Family Medicine

## 2023-10-28 ENCOUNTER — Encounter: Payer: Self-pay | Admitting: Internal Medicine

## 2023-11-06 NOTE — Progress Notes (Addendum)
 Cheyenne Guzman                                          MRN: [id number]   11/06/2023   The [facility] Quality Team Specialist reviewed this patient medical record for the purposes of chart review for care gap closure. The following were reviewed: abstraction for care gap closure-glycemic status assessment. Chart reviewed for BCS, KED and EED as well. Upcoming appt with endo for DM labs.  01/20/2024- chart reviewed    [facility]BCI Quality Team

## 2023-11-12 ENCOUNTER — Other Ambulatory Visit: Payer: Self-pay | Admitting: Family Medicine

## 2023-11-12 DIAGNOSIS — M7918 Myalgia, other site: Secondary | ICD-10-CM | POA: Diagnosis not present

## 2023-11-12 DIAGNOSIS — Z79899 Other long term (current) drug therapy: Secondary | ICD-10-CM | POA: Diagnosis not present

## 2023-11-12 DIAGNOSIS — M069 Rheumatoid arthritis, unspecified: Secondary | ICD-10-CM | POA: Diagnosis not present

## 2023-11-12 DIAGNOSIS — G894 Chronic pain syndrome: Secondary | ICD-10-CM | POA: Diagnosis not present

## 2023-11-19 ENCOUNTER — Encounter: Payer: Self-pay | Admitting: Internal Medicine

## 2023-11-19 ENCOUNTER — Other Ambulatory Visit: Payer: Self-pay

## 2023-11-19 MED ORDER — ONETOUCH VERIO REFLECT W/DEVICE KIT: PACK | 0 refills | Status: DC

## 2023-11-19 MED ORDER — ONETOUCH DELICA PLUS LANCET33G MISC: 3 refills | Status: AC

## 2023-11-19 MED ORDER — ONETOUCH VERIO VI STRP: ORAL_STRIP | 12 refills | Status: DC

## 2023-12-12 ENCOUNTER — Ambulatory Visit: Admitting: Internal Medicine

## 2023-12-27 ENCOUNTER — Ambulatory Visit: Admitting: Internal Medicine

## 2024-01-26 ENCOUNTER — Other Ambulatory Visit: Payer: Self-pay | Admitting: Family Medicine

## 2024-01-27 ENCOUNTER — Encounter: Payer: Self-pay | Admitting: Internal Medicine

## 2024-01-30 ENCOUNTER — Ambulatory Visit: Admitting: Internal Medicine

## 2024-01-30 NOTE — Progress Notes (Unsigned)
 "   Name: Cheyenne Guzman  Age/ Sex: 59 y.o., female   MRN/ DOB: [id number], 24-Dec-1965     PCP: Johnny Garnette LABOR, MD   Reason for Endocrinology Evaluation: Type 2 Diabetes Mellitus  Initial Endocrine Consultative Visit: 08/06/2016    PATIENT IDENTIFIER: Cheyenne Guzman is a 59 y.o. female with a past medical history of T2DM, HTN, OSA. The patient has followed with Endocrinology clinic since 08/06/2016 for consultative assistance with management of her diabetes.     DIABETIC HISTORY:  Cheyenne Guzman was diagnosed with T2DM in 2000. Has been on Metformin  without side effects  .Has been on insulin  since 2009. Her hemoglobin A1c has ranged from 8.2% in 2017, peaking at 10.3% in 2019.  Re-established with me 08/2018. Stopped lantus  and started Novolog  mix as well as metformin .   Attempted to prescribe Ozempic  in 2021 but it was cost prohibitive  By September 2022 we will switch Humalog  mix to Humulin  U-500   Started Mounjaro  10/2021, developed intolerance to Mounjaro  10 mg 02/2023   SUBJECTIVE:    Today (01/30/2024): Cheyenne Guzman is here for a follow up on diabetes management . She checks her glucose multiple times daily through CGM technology.  She has not been noted with hypoglycemia. She  Is symptomatic with these visit   She continues to follow-up with rheumatology for rheumatoid arthritis She follows with [facility] for rheumatoid arthritis, on bupropion and gabapentin   Has chronic nausea but no vomiting  Has occasional heartburn  Has chronic bowel changes due to pain meds    HOME REGIMEN:  Metformin  1000 mg, with breakfast  Mounjaro  2.5 mg weekly  Humulin  U-500 , 90 units with breakfast,  30-50 units with lunch, and 30-50 units with supper Lipitor 10 mg daily      Statin: yes ACE-I/ARB: no Prior Diabetic Education: Yes   CONTINUOUS GLUCOSE MONITORING RECORD INTERPRETATION    Dates of Recording: 7/11-7/24/2025  Sensor description: Dexcom  Results  statistics:   CGM use % of time 94  Average and SD 196/49  Time in range 39  %  % Time Above 180 48  % Time above 250 13  % Time Below target 0   Glycemic patterns summary: BG's are above goal throughout the day and night  Hyperglycemic episodes postprandial  Hypoglycemic episodes occurred N/A   Overnight periods: Fluctuate   DIABETIC COMPLICATIONS: Microvascular complications:  Neuropathy, ? Mild retinopathy  Denies: CKD Last Eye Exam: Completed 11/2020  Macrovascular complications:  TIA Denies: CAD, CVA, PVD   HISTORY:  Past Medical History:  Past Medical History:  Diagnosis Date   Anxiety    per pt. - mild    Arthritis    back & ankles    Asthma    seasonal    Breast cancer (HCC)    Burning sensation of mouth 10/07/2020   Cancer (HCC)    breast, sees Dr. Layla , Left - 11/2007   Candida vaginitis 10/07/2020   Cellulitis 2020   severe - Left leg    Complication of anesthesia 2013   severe muscle spasms-sore-no doc   Depression    Diabetes mellitus    sees Dr. Tommas, diagnosed 2003   HA (headache)    Hearing loss    Went to [facility],Dr Laing,both ears   Hyperlipidemia    Kidney infection    - hosp. [facility]- septic- 10/2009   Low back pain    Muscle tear 10/07/2020  Peripheral neuropathy    R sided numbness- face & jaw   Pulmonary embolism (HCC) 2020   Sepsis (HCC) 2020   Sleep apnea 09/2013   severe-just started cpap-doing well   Sore throat 10/07/2020   TIA (transient ischemic attack)    in 2007,2008, and 2011   Tinnitus of right ear    Urosepsis 08/2009   with Klebsiella   Past Surgical History:  Past Surgical History:  Procedure Laterality Date   ABDOMINAL HYSTERECTOMY  08/2004   BREAST BIOPSY  06/22/2011   Procedure: BREAST BIOPSY;  Surgeon: Jina Nephew, MD;  Location: [facility];  Service: General;  Laterality: Right;   BREAST LUMPECTOMY     BREAST SURGERY  11/2007   left ductal carcinoma in situ   CARPAL TUNNEL  RELEASE Right 10/13/2013   Procedure: RIGHT CARPAL TUNNEL RELEASE;  Surgeon: Maude KANDICE Herald, MD;  Location: [facility];  Service: Orthopedics;  Laterality: Right;   CARPAL TUNNEL RELEASE Left 11/24/2013   Procedure: LEFT CARPAL TUNNEL RELEASE WITH CYST EXCISION ;  Surgeon: Maude KANDICE Herald, MD;  Location: [facility];  Service: Orthopedics;  Laterality: Left;   COLONOSCOPY WITH PROPOFOL  N/A 10/10/2020   Procedure: COLONOSCOPY WITH PROPOFOL ;  Surgeon: Leigh Elspeth SQUIBB, MD;  Location: [facility];  Service: Gastroenterology;  Laterality: N/A;   EAR CYST EXCISION Left 11/24/2013   Procedure: CYST REMOVAL;  Surgeon: Maude KANDICE Herald, MD;  Location: [facility];  Service: Orthopedics;  Laterality: Left;   FOOT SURGERY  2000   plantar fasciitis-left   LUMBAR DISC SURGERY  03-29-14   Fusion, revision 04-27-14 L4-5 Cauda Equina with graft   NERVE GRAFT  06-01-08   cadaver graft to right inferior alveolar nerve  in [place]  -mouth   POLYPECTOMY  10/10/2020   Procedure: POLYPECTOMY;  Surgeon: Leigh Elspeth SQUIBB, MD;  Location: [facility];  Service: Gastroenterology;;   Social History:  reports that she quit smoking about 31 years ago. Her smoking use included cigarettes. She has never used smokeless tobacco. She reports that she does not currently use alcohol. She reports that she does not use drugs. Family History:  Family History  Problem Relation Age of Onset   Cancer Mother        breast   Cancer Father        lung   Diabetes Father    Colon cancer Sister    Cancer Sister 71       colon   Diabetes Sister    Cancer Maternal Aunt        breast - both   Anesthesia problems Neg Hx      HOME MEDICATIONS: Allergies as of 01/30/2024       Reactions   Vicodin [hydrocodone -acetaminophen ] Nausea And Vomiting        Medication List        Accurate as of January 30, 2024  7:34 AM. If you have any questions, ask your nurse or doctor.           pramipexole  0.5 MG tablet Commonly known as: MIRAPEX  TAKE 1 TABLET BY MOUTH EVERYDAY AT BEDTIME The timing of this medication is very important.   Airborne Gummies Chew Chew 3 tablets by mouth every morning.   albuterol  (2.5 MG/3ML) 0.083% nebulizer solution Commonly known as: PROVENTIL  Take 3 mLs (2.5 mg total) by nebulization every 4 (four) hours as needed for wheezing or shortness of breath.   albuterol  108 (90 Base) MCG/ACT inhaler Commonly known  as: VENTOLIN  HFA INHALE 2 PUFFS INTO THE LUNGS EVERY 4 HOURS AS NEEDED FOR WHEEZING OR SHORTNESS OF BREATH.   atorvastatin  10 MG tablet Commonly known as: LIPITOR Take 1 tablet (10 mg total) by mouth daily.   buprenorphine  15 MCG/HR Commonly known as: BUTRANS  Place 1 patch onto the skin every Monday. Uses 20 mcg at this time   Contour Next Monitor w/Device Kit 1 each by Does not apply route 2 (two) times daily. To check blood sugars twice a day.   OneTouch Verio Reflect w/Device Kit Use to check blood sugar one time daily   Dexcom G7 Receiver Devi Use to check blood sugar. E11.65   Dexcom G7 Sensor Misc CHANGE SENSORS EVERY 10 DAYS   furosemide  40 MG tablet Commonly known as: LASIX  TAKE 1 TABLET BY MOUTH TWICE A DAY What changed:  when to take this additional instructions   gabapentin  300 MG capsule Commonly known as: NEURONTIN  Take 300 mg by mouth 2 (two) times daily. Take with a 100 mg capsule for a total dose of 400 mg twice daily   gabapentin  100 MG capsule Commonly known as: NEURONTIN  Take 100 mg by mouth 2 (two) times daily. Take with a 300 mg capsule for a total dose of 400 mg twice daily   glucose blood test strip Use as instructed   glucose blood test strip Commonly known as: Contour Next Test Use to test blood sugar 2 times daily   OneTouch Verio test strip Generic drug: glucose blood Use to check blood sugar one time daily   HumuLIN  R U-500 KwikPen 500 UNIT/ML KwikPen Generic drug: insulin   regular human CONCENTRATED Inject 50 Units into the skin daily with breakfast AND 36 Units daily before lunch AND 36 Units daily before supper. Max daily 200 units.   hydroxychloroquine  200 MG tablet Commonly known as: PLAQUENIL  Take 1 tablet (200 mg total) by mouth daily.   Insulin  Pen Needle 31G X 8 MM Misc 1 Device by Does not apply route 3 (three) times daily.   levothyroxine  75 MCG tablet Commonly known as: SYNTHROID  Take 1 tablet (75 mcg total) by mouth daily.   lidocaine  5 % ointment Commonly known as: XYLOCAINE  Apply 1 application topically 2 (two) times daily as needed (pain).   metFORMIN  750 MG 24 hr tablet Commonly known as: GLUCOPHAGE -XR Take 1 tablet (750 mg total) by mouth daily with breakfast.   methocarbamol  750 MG tablet Commonly known as: ROBAXIN  Take 1 tablet (750 mg total) by mouth 2 (two) times daily. What changed: when to take this   multivitamin with minerals Tabs tablet Take 1 tablet by mouth daily.   ondansetron  4 MG tablet Commonly known as: ZOFRAN  Take 1 tablet (4 mg total) by mouth as directed. Take one Zofran  pill 30-60 minutes before each colonoscopy prep dose   OneTouch Delica Plus Lancet33G Misc Use to check blood sugar one time daily   rivaroxaban  20 MG Tabs tablet Commonly known as: Xarelto  Take 1 tablet (20 mg total) by mouth daily.   sertraline  100 MG tablet Commonly known as: ZOLOFT  TAKE 2 TABLETS BY MOUTH AT BEDTIME   tirzepatide  5 MG/0.5ML Pen Commonly known as: MOUNJARO  Inject 5 mg into the skin once a week.   Vitamin D3 50 MCG (2000 UT) Tabs Take 2,000 Units by mouth every morning.         OBJECTIVE:   Vital Signs: There were no vitals taken for this visit.  Wt Readings from Last 3 Encounters:  08/15/23 ROLLEN)  450 lb (204.1 kg)  02/12/23 (!) 428 lb 9.6 oz (194.4 kg)  03/16/22 (!) 429 lb (194.6 kg)     Exam: General: Pt appears well and is in NAD, in a wheelchair  Lungs: Clear with good BS bilat   Heart: RRR   Extremities: Trace edema   Neuro: MS is good with appropriate affect, pt is alert and Ox3   DM Foot Exam 08/15/2023  The skin of the feet is intact without sores or ulcerations. The pedal pulses are 1+ on right and 1+ on left. The sensation is decreased  to a screening 5.07, 10 gram monofilament bilaterally    DATA REVIEWED:  Lab Results  Component Value Date   HGBA1C 7.9 (A) 08/15/2023   HGBA1C 7.2 (A) 02/12/2023   HGBA1C 7.2 (A) 03/16/2022    Latest Reference Range & Units 08/15/23 10:42  Sodium 135 - 146 mmol/L 139  Potassium 3.5 - 5.3 mmol/L 4.4  Chloride 98 - 110 mmol/L 101  CO2 20 - 32 mmol/L 29  Glucose 65 - 99 mg/dL 798 (H)  BUN 7 - 25 mg/dL 14  Creatinine 9.49 - 8.96 mg/dL 8.96  Calcium  8.6 - 10.4 mg/dL 8.7  BUN/Creatinine Ratio 6 - 22 (calc) SEE NOTE:  eGFR > OR = 60 mL/min/1.51m2 63    ASSESSMENT / PLAN / RECOMMENDATIONS:   1) Type 2 Diabetes Mellitus, Optimally  controlled, With retinopathic and neuropathic complications - Most recent A1c of 7.9 %. Goal A1c < 7.0 %.   -A1c has trended up, this is attributed to decreasing dose of Mounjaro  due to intolerance to the 10 mg dose - We will increase Mounjaro  as below and avoid the 10 mg dose - I have also recommended switching metformin  from regular release to extended formula - GFR normal  MEDICATIONS:  -Change Humulin  U-500 at 90 units before breakfast, 33-55 units before lunch and 35-55 units before supper - Switch metformin  750XR mg daily beakfast  -Increase Mounjaro  5 mg weekly   EDUCATION / INSTRUCTIONS: BG monitoring instructions: Patient is instructed to check her blood sugars 3 times a day, before each meal Call [facility] if: BG persistently < 70  I reviewed the Rule of 15 for the treatment of hypoglycemia in detail with the patient. Literature supplied.    2) Diabetic complications:  Eye: Does have known diabetic retinopathy. Per pt (mild) Neuro/ Feet: Does have known  diabetic peripheral neuropathy .  Renal: Patient does not have known baseline CKD. She   is not on an ACEI/ARB at present.   3) Dyslipidemia:  - Lipid panel acceptable    Medication Continue atorvastatin  10 mg daily     F/U in 4 months     Signed electronically by: Stefano Redgie Butts, MD  [facility]  [facility] [address] [place], [place] [address] Phone: [phone] FAX: [phone]   CC: Johnny Garnette LABOR, MD [address] [place] [address] Phone: [phone]  Fax: [phone]  Return to Endocrinology clinic as below: Future Appointments  Date Time Provider Department Center  01/30/2024 10:50 AM Tamorah Hada, Donell Redgie, MD [id number] None       "

## 2024-02-04 ENCOUNTER — Encounter: Payer: Self-pay | Admitting: Internal Medicine

## 2024-02-05 ENCOUNTER — Encounter: Payer: Self-pay | Admitting: Family Medicine

## 2024-02-05 DIAGNOSIS — N644 Mastodynia: Secondary | ICD-10-CM

## 2024-02-06 NOTE — Telephone Encounter (Signed)
I ordered the diagnostic mammogram and [place] .

## 2024-02-10 ENCOUNTER — Other Ambulatory Visit (HOSPITAL_COMMUNITY): Payer: Self-pay

## 2024-02-10 ENCOUNTER — Encounter: Payer: Self-pay | Admitting: Internal Medicine

## 2024-02-10 ENCOUNTER — Telehealth: Payer: Self-pay

## 2024-02-10 ENCOUNTER — Encounter: Payer: Self-pay | Admitting: Family Medicine

## 2024-02-10 ENCOUNTER — Ambulatory Visit: Payer: Medicare (Managed Care) | Admitting: Internal Medicine

## 2024-02-10 VITALS — BP 138/82 | Ht 67.0 in | Wt >= 6400 oz

## 2024-02-10 DIAGNOSIS — Z7984 Long term (current) use of oral hypoglycemic drugs: Secondary | ICD-10-CM

## 2024-02-10 DIAGNOSIS — E1142 Type 2 diabetes mellitus with diabetic polyneuropathy: Secondary | ICD-10-CM | POA: Diagnosis not present

## 2024-02-10 DIAGNOSIS — Z7985 Long-term (current) use of injectable non-insulin antidiabetic drugs: Secondary | ICD-10-CM | POA: Diagnosis not present

## 2024-02-10 DIAGNOSIS — E1165 Type 2 diabetes mellitus with hyperglycemia: Secondary | ICD-10-CM | POA: Diagnosis not present

## 2024-02-10 DIAGNOSIS — Z794 Long term (current) use of insulin: Secondary | ICD-10-CM

## 2024-02-10 LAB — POCT GLYCOSYLATED HEMOGLOBIN (HGB A1C): Hemoglobin A1C: 8.8 % — AB (ref 4.0–5.6)

## 2024-02-10 MED ORDER — METFORMIN HCL ER 500 MG PO TB24: 1000.0000 mg | ORAL_TABLET | Freq: Every day | ORAL | 3 refills | Status: AC

## 2024-02-10 MED ORDER — TIRZEPATIDE 5 MG/0.5ML ~~LOC~~ SOAJ: 5.0000 mg | SUBCUTANEOUS | 3 refills | Status: AC

## 2024-02-10 NOTE — Telephone Encounter (Signed)
 Pharmacy Patient Advocate Encounter   Received notification from Physician's Office that prior authorization for Mounjaro  5MG /0.5ML auto-injectors is required/requested.   Insurance verification completed.   The patient is insured through [employer].   Per test claim: The current 30 day co-pay is, $47.  No PA needed at this time. This test claim was processed through [facility]- copay amounts may vary at other pharmacies due to pharmacy/plan contracts, or as the patient moves through the different stages of their insurance plan.   Archived key: [id number]

## 2024-02-10 NOTE — Patient Instructions (Addendum)
" °-   Change Humulin  U-500  110 units with Breakfast, 70-100 units with Lunch, 85 units with Supper  - Change Metformin  500 mg XR, 2 tablets  - Continue  Mounjaro   5 mg once weekly     HOW TO TREAT LOW BLOOD SUGARS (Blood sugar LESS THAN 70 MG/DL) Please follow the RULE OF 15 for the treatment of hypoglycemia treatment (when your (blood sugars are less than 70 mg/dL)   STEP 1: Take 15 grams of carbohydrates when your blood sugar is low, which includes:  3-4 GLUCOSE TABS  OR 3-4 OZ OF JUICE OR REGULAR SODA OR ONE TUBE OF GLUCOSE GEL    STEP 2: RECHECK blood sugar in 15 MINUTES STEP 3: If your blood sugar is still low at the 15 minute recheck --> then, go back to STEP 1 and treat AGAIN with another 15 grams of carbohydrates. "

## 2024-02-10 NOTE — Progress Notes (Signed)
 "   Name: Cheyenne Guzman  Age/ Sex: 59 y.o., female   MRN/ DOB: [id number], [birth year]     PCP: Johnny Garnette LABOR, MD   Reason for Endocrinology Evaluation: Type 2 Diabetes Mellitus  Initial Endocrine Consultative Visit: 08/06/2016    PATIENT IDENTIFIER: Cheyenne Guzman is a 59 y.o. female with a past medical history of T2DM, HTN, OSA. The patient has followed with Endocrinology clinic since 08/06/2016 for consultative assistance with management of her diabetes.     DIABETIC HISTORY:  [name] was diagnosed with T2DM in 2000. Has been on Metformin  without side effects  .Has been on insulin  since 2009. Her hemoglobin A1c has ranged from 8.2% in 2017, peaking at 10.3% in 2019.  Re-established with me 08/2018. Stopped lantus  and started Novolog  mix as well as metformin .   Attempted to prescribe Ozempic  in 2021 but it was cost prohibitive  By September 2022 we will switch Humalog  mix to Humulin  U-500   Started Mounjaro  10/2021, developed intolerance to Mounjaro  10 mg 02/2023.  We had to decrease the dose and start with 2.5 mg, this was eventually increased to 5 mg    SUBJECTIVE:    Today (02/10/2024): [name] is here for a follow up on diabetes management . She checks her glucose multiple times daily through CGM technology.  She has been noted with hypoglycemia. She  Is symptomatic with these visit   She continues to follow-up with rheumatology for rheumatoid arthritis, she is on Neurontin  for chronic pain management  She has been without mounjaro  for a few weeks   Continues with stomach issues in the form  with mostly constipation , as well as bloating and heartburn with occasional diarrhea    HOME REGIMEN:  Metformin  750 mg XR, daily  Mounjaro  5 mg weekly  Humulin  U-500 , 110 units with breakfast,  70-100 units with lunch, and 80 units with supper Lipitor 10 mg daily      Statin: yes ACE-I/ARB: no Prior Diabetic Education: Yes   CONTINUOUS GLUCOSE MONITORING RECORD  INTERPRETATION                     DIABETIC COMPLICATIONS: Microvascular complications:  Neuropathy, ? Mild retinopathy  Denies: CKD Last Eye Exam: Completed 12/26/2022  Macrovascular complications:  TIA Denies: CAD, CVA, PVD   HISTORY:  Past Medical History:  Past Medical History:  Diagnosis Date   Anxiety    per pt. - mild    Arthritis    back & ankles    Asthma    seasonal    Breast cancer (HCC)    Burning sensation of mouth 10/07/2020   Cancer (HCC)    breast, sees Dr. Layla , Left - 11/2007   Candida vaginitis 10/07/2020   Cellulitis 2020   severe - Left leg    Complication of anesthesia 2013   severe muscle spasms-sore-no doc   Depression    Diabetes mellitus    sees Dr. Tommas, diagnosed 2003   HA (headache)    Hearing loss    Went to [facility],Dr Laing,both ears   Hyperlipidemia    Kidney infection    - hosp. [facility]- septic- 10/2009   Low back pain    Muscle tear 10/07/2020   Peripheral neuropathy    R sided numbness- face & jaw   Pulmonary embolism (HCC) 2020   Sepsis (HCC) 2020   Sleep apnea 09/2013   severe-just started cpap-doing well  Sore throat 10/07/2020   TIA (transient ischemic attack)    in 2007,2008, and 2011   Tinnitus of right ear    Urosepsis 08/2009   with Klebsiella   Past Surgical History:  Past Surgical History:  Procedure Laterality Date   ABDOMINAL HYSTERECTOMY  08/2004   BREAST BIOPSY  06/22/2011   Procedure: BREAST BIOPSY;  Surgeon: Jina Nephew, MD;  Location: [facility] OR;  Service: General;  Laterality: Right;   BREAST LUMPECTOMY     BREAST SURGERY  11/2007   left ductal carcinoma in situ   CARPAL TUNNEL RELEASE Right 10/13/2013   Procedure: RIGHT CARPAL TUNNEL RELEASE;  Surgeon: Maude KANDICE Herald, MD;  Location: [facility];  Service: Orthopedics;  Laterality: Right;   CARPAL TUNNEL RELEASE Left 11/24/2013   Procedure: LEFT CARPAL TUNNEL RELEASE WITH CYST EXCISION ;  Surgeon:  Maude KANDICE Herald, MD;  Location: [facility];  Service: Orthopedics;  Laterality: Left;   COLONOSCOPY WITH PROPOFOL  N/A 10/10/2020   Procedure: COLONOSCOPY WITH PROPOFOL ;  Surgeon: Leigh Elspeth SQUIBB, MD;  Location: [facility];  Service: Gastroenterology;  Laterality: N/A;   EAR CYST EXCISION Left 11/24/2013   Procedure: CYST REMOVAL;  Surgeon: Maude KANDICE Herald, MD;  Location: [facility];  Service: Orthopedics;  Laterality: Left;   FOOT SURGERY  2000   plantar fasciitis-left   LUMBAR DISC SURGERY  03-29-14   Fusion, revision 04-27-14 L4-5 Cauda Equina with graft   NERVE GRAFT  06-01-08   cadaver graft to right inferior alveolar nerve  in [place]  -mouth   POLYPECTOMY  10/10/2020   Procedure: POLYPECTOMY;  Surgeon: Leigh Elspeth SQUIBB, MD;  Location: [facility];  Service: Gastroenterology;;   Social History:  reports that she quit smoking about 31 years ago. Her smoking use included cigarettes. She has never used smokeless tobacco. She reports that she does not currently use alcohol. She reports that she does not use drugs. Family History:  Family History  Problem Relation Age of Onset   Cancer Mother        breast   Cancer Father        lung   Diabetes Father    Colon cancer Sister    Cancer Sister 53       colon   Diabetes Sister    Cancer Maternal Aunt        breast - both   Anesthesia problems Neg Hx      HOME MEDICATIONS: Allergies as of 02/10/2024       Reactions   Vicodin [hydrocodone -acetaminophen ] Nausea And Vomiting        Medication List        Accurate as of February 10, 2024  1:53 PM. If you have any questions, ask your nurse or doctor.          pramipexole  0.5 MG tablet Commonly known as: MIRAPEX  TAKE 1 TABLET BY MOUTH EVERYDAY AT BEDTIME The timing of this medication is very important.   Airborne Gummies Chew Chew 3 tablets by mouth every morning.   albuterol  (2.5 MG/3ML) 0.083% nebulizer solution Commonly known as:  PROVENTIL  Take 3 mLs (2.5 mg total) by nebulization every 4 (four) hours as needed for wheezing or shortness of breath.   albuterol  108 (90 Base) MCG/ACT inhaler Commonly known as: VENTOLIN  HFA INHALE 2 PUFFS INTO THE LUNGS EVERY 4 HOURS AS NEEDED FOR WHEEZING OR SHORTNESS OF BREATH.   atorvastatin  10 MG tablet Commonly known as: LIPITOR Take 1 tablet (10 mg  total) by mouth daily.   buprenorphine  15 MCG/HR Commonly known as: BUTRANS  Place 1 patch onto the skin every Monday. Uses 20 mcg at this time   Contour Next Monitor w/Device Kit 1 each by Does not apply route 2 (two) times daily. To check blood sugars twice a day. What changed: Another medication with the same name was removed. Continue taking this medication, and follow the directions you see here. Changed by: Donell Butts, MD   Dexcom G7 Receiver Devi Use to check blood sugar. E11.65   Dexcom G7 Sensor Misc CHANGE SENSORS EVERY 10 DAYS   furosemide  40 MG tablet Commonly known as: LASIX  TAKE 1 TABLET BY MOUTH TWICE A DAY What changed:  when to take this additional instructions   gabapentin  300 MG capsule Commonly known as: NEURONTIN  Take 300 mg by mouth 2 (two) times daily. Take with a 100 mg capsule for a total dose of 400 mg twice daily   gabapentin  100 MG capsule Commonly known as: NEURONTIN  Take 100 mg by mouth 2 (two) times daily. Take with a 300 mg capsule for a total dose of 400 mg twice daily   glucose blood test strip Use as instructed What changed: Another medication with the same name was removed. Continue taking this medication, and follow the directions you see here. Changed by: Donell Butts, MD   glucose blood test strip Commonly known as: Contour Next Test Use to test blood sugar 2 times daily What changed: Another medication with the same name was removed. Continue taking this medication, and follow the directions you see here. Changed by: Donell Butts, MD   HumuLIN  R U-500  KwikPen 500 UNIT/ML KwikPen Generic drug: insulin  regular human CONCENTRATED Inject 50 Units into the skin daily with breakfast AND 36 Units daily before lunch AND 36 Units daily before supper. Max daily 200 units.   hydroxychloroquine  200 MG tablet Commonly known as: PLAQUENIL  Take 1 tablet (200 mg total) by mouth daily.   Insulin  Pen Needle 31G X 8 MM Misc 1 Device by Does not apply route 3 (three) times daily.   levothyroxine  75 MCG tablet Commonly known as: SYNTHROID  Take 1 tablet (75 mcg total) by mouth daily.   lidocaine  5 % ointment Commonly known as: XYLOCAINE  Apply 1 application topically 2 (two) times daily as needed (pain).   metFORMIN  750 MG 24 hr tablet Commonly known as: GLUCOPHAGE -XR Take 1 tablet (750 mg total) by mouth daily with breakfast.   methocarbamol  750 MG tablet Commonly known as: ROBAXIN  Take 1 tablet (750 mg total) by mouth 2 (two) times daily. What changed: when to take this   multivitamin with minerals Tabs tablet Take 1 tablet by mouth daily.   ondansetron  4 MG tablet Commonly known as: ZOFRAN  Take 1 tablet (4 mg total) by mouth as directed. Take one Zofran  pill 30-60 minutes before each colonoscopy prep dose   OneTouch Delica Plus Lancet33G Misc Use to check blood sugar one time daily   rivaroxaban  20 MG Tabs tablet Commonly known as: Xarelto  Take 1 tablet (20 mg total) by mouth daily.   sertraline  100 MG tablet Commonly known as: ZOLOFT  TAKE 2 TABLETS BY MOUTH AT BEDTIME   tirzepatide  5 MG/0.5ML Pen Commonly known as: MOUNJARO  Inject 5 mg into the skin once a week.   Vitamin D3 50 MCG (2000 UT) Tabs Take 2,000 Units by mouth every morning.         OBJECTIVE:   Vital Signs: BP 138/82   Ht 5' 7 (  1.702 m)   Wt (!) 444 lb (201.4 kg)   BMI 69.54 kg/m   Wt Readings from Last 3 Encounters:  02/10/24 (!) 444 lb (201.4 kg)  08/15/23 (!) 450 lb (204.1 kg)  02/12/23 (!) 428 lb 9.6 oz (194.4 kg)     Exam: General: Pt  appears well and is in NAD, in a wheelchair  Lungs: Clear with good BS bilat   Heart: RRR  Extremities: Trace edema   Neuro: MS is good with appropriate affect, pt is alert and Ox3   DM Foot Exam 08/15/2023  The skin of the feet is intact without sores or ulcerations. The pedal pulses are 1+ on right and 1+ on left. The sensation is decreased  to a screening 5.07, 10 gram monofilament bilaterally    DATA REVIEWED:  Lab Results  Component Value Date   HGBA1C 7.9 (A) 08/15/2023   HGBA1C 7.2 (A) 02/12/2023   HGBA1C 7.2 (A) 03/16/2022    Latest Reference Range & Units 08/15/23 10:42  Sodium 135 - 146 mmol/L 139  Potassium 3.5 - 5.3 mmol/L 4.4  Chloride 98 - 110 mmol/L 101  CO2 20 - 32 mmol/L 29  Glucose 65 - 99 mg/dL 798 (H)  BUN 7 - 25 mg/dL 14  Creatinine 9.49 - 8.96 mg/dL 8.96  Calcium  8.6 - 10.4 mg/dL 8.7  BUN/Creatinine Ratio 6 - 22 (calc) SEE NOTE:  eGFR > OR = 60 mL/min/1.66m2 63    ASSESSMENT / PLAN / RECOMMENDATIONS:   1) Type 2 Diabetes Mellitus, Optimally  controlled, With retinopathic and neuropathic complications - Most recent A1c of 8.8%. Goal A1c < 7.0 %.   - Patient has noted worsening glycemic control, she attributes this to decreasing metformin  from 1000 mg daily to 750 mg daily -She also has not been able to get the Mounjaro  5 mg dose, and has been using leftover 2.5 mg dose, she was provided with #4 pens of Mounjaro  2.5 mg samples -She was under the impression that she needed prior authorization, no prior authorization needed to confirm through pharmacy -Intolerant to Mounjaro  10 mg dose -I will increase suppertime dose of Humulin -U500 as below -Will switch metformin  to 500 mg XR, 2 tabs per her request   -MEDICATIONS:  -Change Humulin  U-500 at 110 units before breakfast, 70-100units before lunch and 85 units before supper - Switch metformin  500 XR mg , 2 tabs daily  - Continue Mounjaro  5 mg weekly   EDUCATION / INSTRUCTIONS: BG monitoring  instructions: Patient is instructed to check her blood sugars 3 times a day, before each meal Call [facility] if: BG persistently < 70  I reviewed the Rule of 15 for the treatment of hypoglycemia in detail with the patient. Literature supplied.    2) Diabetic complications:  Eye: Does have known diabetic retinopathy. Per pt (mild) Neuro/ Feet: Does have known diabetic peripheral neuropathy .  Renal: Patient does not have known baseline CKD. She   is not on an ACEI/ARB at present.   3) Dyslipidemia:  - Lipid panel acceptable    Medication Continue atorvastatin  10 mg daily     F/U in 4 months     Signed electronically by: Stefano Redgie Butts, MD  [facility]  [facility] [address] [place] [place], [place] [address] Phone: [phone] FAX: [phone]   CC: Johnny Garnette LABOR, MD [address] [place] [address] Phone: [phone]  Fax: [phone]  Return to Endocrinology clinic as below: Future  Appointments  Date Time Provider Department Center  02/20/2024 12:40 PM [facility] DIAG [facility] 1 [facility]  02/20/2024 12:50 PM [facility] US  1 [facility]       "

## 2024-02-20 ENCOUNTER — Other Ambulatory Visit: Payer: Medicare (Managed Care)

## 2024-02-27 ENCOUNTER — Ambulatory Visit: Payer: Self-pay

## 2024-02-27 ENCOUNTER — Telehealth: Payer: Medicare (Managed Care)

## 2024-02-27 DIAGNOSIS — N390 Urinary tract infection, site not specified: Secondary | ICD-10-CM

## 2024-02-27 DIAGNOSIS — R509 Fever, unspecified: Secondary | ICD-10-CM

## 2024-02-27 MED ORDER — NITROFURANTOIN MONOHYD MACRO 100 MG PO CAPS: 100.0000 mg | ORAL_CAPSULE | Freq: Two times a day (BID) | ORAL | 2 refills | Status: DC

## 2024-02-27 NOTE — Patient Instructions (Signed)
 Due to your history of UTIs, sepsis, and your documented fever of 101.7, treatment of your condition falls outside the scope of practice for Virtual Care and you'll need to be seen in person.    Listed below are some options for emergency care.   Alisa GORMAN Leghorn, thank you for joining Lamar Schlossman, PA-C for today's virtual visit.  While this provider is not your primary care provider (PCP), if your PCP is located in our provider database this encounter information will be shared with them immediately following your visit.   A [facility] MyChart account gives you access to today's visit and all your visits, tests, and labs performed at [facility]  click here if you don't have a [facility] MyChart account or go to [email or web address]  Consent: (Patient) Alisa GORMAN Leghorn provided verbal consent for this virtual visit at the beginning of the encounter.  Current Medications:  Current Outpatient Medications:    albuterol  (PROVENTIL ) (2.5 MG/3ML) 0.083% nebulizer solution, Take 3 mLs (2.5 mg total) by nebulization every 4 (four) hours as needed for wheezing or shortness of breath., Disp: 75 mL, Rfl: 1   albuterol  (VENTOLIN  HFA) 108 (90 Base) MCG/ACT inhaler, INHALE 2 PUFFS INTO THE LUNGS EVERY 4 HOURS AS NEEDED FOR WHEEZING OR SHORTNESS OF BREATH., Disp: 20.1 each, Rfl: 1   atorvastatin  (LIPITOR) 10 MG tablet, Take 1 tablet (10 mg total) by mouth daily., Disp: 90 tablet, Rfl: 3   Blood Glucose Monitoring Suppl (CONTOUR NEXT MONITOR) w/Device KIT, 1 each by Does not apply route 2 (two) times daily. To check blood sugars twice a day., Disp: 1 kit, Rfl: 0   Buprenorphine  15 MCG/HR PTWK, Place 1 patch onto the skin every Monday. Uses 20 mcg at this time, Disp: , Rfl: 0   Cholecalciferol  (VITAMIN D3) 50 MCG (2000 UT) TABS, Take 2,000 Units by mouth every morning., Disp: , Rfl:    Continuous Blood Gluc Receiver (DEXCOM G7 RECEIVER) DEVI, Use to check blood sugar. E11.65, Disp: 1 each,  Rfl: 0   Continuous Glucose Sensor (DEXCOM G7 SENSOR) MISC, CHANGE SENSORS EVERY 10 DAYS, Disp: 9 each, Rfl: 3   furosemide  (LASIX ) 40 MG tablet, TAKE 1 TABLET BY MOUTH TWICE A DAY (Patient taking differently: Take 40 mg by mouth See admin instructions. Take one tablet (40 mg) by mouth daily when not going out - approximately 5 times week), Disp: 180 tablet, Rfl: 0   gabapentin  (NEURONTIN ) 100 MG capsule, Take 100 mg by mouth 2 (two) times daily. Take with a 300 mg capsule for a total dose of 400 mg twice daily, Disp: , Rfl:    gabapentin  (NEURONTIN ) 300 MG capsule, Take 300 mg by mouth 2 (two) times daily. Take with a 100 mg capsule for a total dose of 400 mg twice daily, Disp: , Rfl:    glucose blood (CONTOUR NEXT TEST) test strip, Use to test blood sugar 2 times daily, Disp: 100 each, Rfl: 12   glucose blood test strip, Use as instructed, Disp: 100 each, Rfl: 0   hydroxychloroquine  (PLAQUENIL ) 200 MG tablet, Take 1 tablet (200 mg total) by mouth daily., Disp: 90 tablet, Rfl: 0   Insulin  Pen Needle 31G X 8 MM MISC, 1 Device by Does not apply route 3 (three) times daily., Disp: 300 each, Rfl: 3   insulin  regular human CONCENTRATED (HUMULIN  R U-500 KWIKPEN) 500 UNIT/ML KwikPen, Inject 50 Units into the skin daily with breakfast AND 36 Units daily before lunch AND 36 Units daily  before supper. Max daily 200 units., Disp: 15 mL, Rfl: 6   Lancets (ONETOUCH DELICA PLUS LANCET33G) MISC, Use to check blood sugar one time daily, Disp: 200 each, Rfl: 3   levothyroxine  (SYNTHROID ) 75 MCG tablet, Take 1 tablet (75 mcg total) by mouth daily., Disp: 90 tablet, Rfl: 3   lidocaine  (XYLOCAINE ) 5 % ointment, Apply 1 application topically 2 (two) times daily as needed (pain)., Disp: , Rfl:    metFORMIN  (GLUCOPHAGE -XR) 500 MG 24 hr tablet, Take 2 tablets (1,000 mg total) by mouth daily with breakfast., Disp: 180 tablet, Rfl: 3   methocarbamol  (ROBAXIN ) 750 MG tablet, Take 1 tablet (750 mg total) by mouth 2 (two) times  daily. (Patient taking differently: Take 750 mg by mouth every morning.), Disp: 60 tablet, Rfl: 5   Multiple Vitamin (MULTIVITAMIN WITH MINERALS) TABS tablet, Take 1 tablet by mouth daily., Disp: , Rfl:    Multiple Vitamins-Minerals (AIRBORNE GUMMIES) CHEW, Chew 3 tablets by mouth every morning., Disp: , Rfl:    ondansetron  (ZOFRAN ) 4 MG tablet, Take 1 tablet (4 mg total) by mouth as directed. Take one Zofran  pill 30-60 minutes before each colonoscopy prep dose, Disp: 2 tablet, Rfl: 0   pramipexole  (MIRAPEX ) 0.5 MG tablet, TAKE 1 TABLET BY MOUTH EVERYDAY AT BEDTIME, Disp: 90 tablet, Rfl: 0   rivaroxaban  (XARELTO ) 20 MG TABS tablet, Take 1 tablet (20 mg total) by mouth daily., Disp: 90 tablet, Rfl: 3   sertraline  (ZOLOFT ) 100 MG tablet, TAKE 2 TABLETS BY MOUTH AT BEDTIME, Disp: 180 tablet, Rfl: 0   tirzepatide  (MOUNJARO ) 5 MG/0.5ML Pen, Inject 5 mg into the skin once a week., Disp: 6 mL, Rfl: 3   Medications ordered in this encounter:  No orders of the defined types were placed in this encounter.    *If you need refills on other medications prior to your next appointment, please contact your pharmacy*  Follow-Up: Call back or seek an in-person evaluation if the symptoms worsen or if the condition fails to improve as anticipated.     Other Instructions Based on what you shared with me, I feel your condition warrants further evaluation as soon as possible at an Emergency department.    NOTE: There will be NO CHARGE for this eVisit   If you are having a true medical emergency please call 911.      Emergency Department-[facility]  Get Driving Directions  [phone]  [address]  [place], [place] [address]  Open 24/7/365      [facility] Emergency Department at [facility]  Get Driving Directions  [address]  [place], [place] [address]  Open 24/7/365    Emergency Department- [facility]  Get Driving Directions   [phone]  [address]  [place], [place] [address]  Open 24/7/365      [facility] at [facility]  Get Driving Directions  [phone]  [address]  [place], [place] [address]  Open 24/7/365    [facility]  Emergency Department- [facility]  Get Driving Directions  [phone]  [address]  [place], [place] [address]  Open 24/7/365    [place]  Emergency Department- [facility]  Get Driving Directions  [address]  [place], [place] [address]  Open 24/7/365    [facility]  Emergency Department- [facility]  Get Driving Directions  [phone]  [address]  [place], [place] [address]  Open 24/7/365      If you have been instructed to have an in-person evaluation today at a local Urgent Care facility, please use the link below. It will take you to a list of all of our available [facility], including address, phone number and hours of operation. Please do not delay care.  [facility]  If you or a family member do not have a primary care provider, use the link below to schedule a visit and establish care. When you choose a [facility] primary care physician or advanced practice provider, you gain a long-term partner in health. Find a Primary Care Provider  Learn more about [facility]'s in-office and virtual care options: [facility] - Get Care Now

## 2024-02-27 NOTE — Progress Notes (Signed)
 " Virtual Visit Consent   Cheyenne Guzman, you are scheduled for a virtual visit with a [facility] provider today. Just as with appointments in the office, your consent must be obtained to participate. Your consent will be active for this visit and any virtual visit you may have with one of our providers in the next 365 days. If you have a MyChart account, a copy of this consent can be sent to you electronically.  As this is a virtual visit, video technology does not allow for your provider to perform a traditional examination. This may limit your provider's ability to fully assess your condition. If your provider identifies any concerns that need to be evaluated in person or the need to arrange testing (such as labs, EKG, etc.), we will make arrangements to do so. Although advances in technology are sophisticated, we cannot ensure that it will always work on either your end or our end. If the connection with a video visit is poor, the visit may have to be switched to a telephone visit. With either a video or telephone visit, we are not always able to ensure that we have a secure connection.  By engaging in this virtual visit, you consent to the provision of healthcare and authorize for your insurance to be billed (if applicable) for the services provided during this visit. Depending on your insurance coverage, you may receive a charge related to this service.  I need to obtain your verbal consent now. Are you willing to proceed with your visit today? Cheyenne Guzman has provided verbal consent on 02/27/2024 for a virtual visit (video or telephone). Lamar Schlossman, PA-C  Date: 02/27/2024 8:35 AM   Virtual Visit via Video Note   I, Lamar Schlossman, connected with  Cheyenne Guzman  ([id number], 1959/04/21) on 02/27/24 at  8:30 AM EST by a video-enabled telemedicine application and verified that I am speaking with the correct person using two identifiers.  Location: Patient: Virtual Visit Location Patient:  Home Provider: Virtual Visit Location Provider: Home Office   I discussed the limitations of evaluation and management by telemedicine and the availability of in person appointments. The patient expressed understanding and agreed to proceed.    History of Present Illness: Cheyenne Guzman is a 59 y.o. who identifies as a female who was assigned female at birth, and is being seen today for recurrent UTIs.  Having urinary odor, low back pain, frequency, cloudy urine.  States that she had a fever to 101.7.  Has hx of UTIs.  Has had hx of urosepsis.  Last UTI was about a year ago. States that she had an antibiotic refill on standby, but it expired.   HPI: HPI  Problems:  Patient Active Problem List   Diagnosis Date Noted   Long term current use of oral hypoglycemic drug 09/25/2022   RA (rheumatoid arthritis) (HCC) 08/10/2021   Morbid obesity (HCC) 10/14/2020   History of colonic polyps    Family history of colon cancer    Benign neoplasm of colon    Muscle tear 10/07/2020   Burning sensation of mouth 10/07/2020   Candida vaginitis 10/07/2020   Sore throat 10/07/2020   Groin pain, left 09/19/2020   Left buttock pain 09/19/2020   Fatty liver 09/19/2020   Splenomegaly 09/19/2020   Left nephrolithiasis 09/19/2020   Restless legs syndrome 04/14/2019   Dyslipidemia 11/07/2018   Type 2 diabetes mellitus with hyperglycemia, with long-term current use of insulin  (HCC) 09/18/2018   Type 2  diabetes mellitus with diabetic polyneuropathy, with long-term current use of insulin  (HCC) 09/18/2018   Recurrent cellulitis of lower extremity 07/31/2018   Serratia infection 07/31/2018   Morbid obesity with BMI of 70 and over, adult (HCC)    Sepsis (HCC) 06/19/2018   Chronic pain 06/19/2018   Current use of long term anticoagulation 03/28/2018   Pulmonary embolism (HCC) 03/12/2018   Type 2 diabetes mellitus with obesity 03/12/2018   History of TIA (transient ischemic attack) 02/06/2018   Mild intermittent  asthma without complication 02/06/2018   History of pulmonary embolism 2020   Hypothyroidism 08/14/2017   Lymphedema of both lower extremities 10/18/2015   Diabetic neuropathy (HCC) 01/07/2014   Congenital spondylolysis of lumbosacral region 11/02/2013   Chronic low back pain 10/08/2013   Obstructive sleep apnea 07/31/2013   Drug induced headache    Hearing loss 06/23/2013   Shortness of breath 11/27/2012   Cellulitis of left lower extremity 07/03/2012   DCIS (ductal carcinoma in situ) of breast, left, treated with [facility] 11/2007 02/19/2011   Asthma 02/20/2008   Hyperlipidemia associated with type 2 diabetes mellitus (HCC) 11/26/2006   Anxiety and depression 11/26/2006    Allergies: Allergies[1] Medications: Current Medications[2]  Observations/Objective: Patient is well-developed, well-nourished in no acute distress.  Resting comfortably  at home.  Head is normocephalic, atraumatic.  No labored breathing.  Speech is clear and coherent with logical content.  Patient is alert and oriented at baseline.    Assessment and Plan: 1. Urinary tract infection without hematuria, site unspecified (Primary)  2. Fever, unspecified fever cause    Patient with hx of recurrent UTIs and hx of urosepsis presenting with fever to 101.7 and UTI symptoms.  She also has low back pain.  Due to her hx of recurrent UTIs and concern for severe/complicated UTI during this visit, she has been referred to in-person visits due to complicated UTIs being outside the scope of practice of virtual care visits.  Follow Up Instructions: I discussed the assessment and treatment plan with the patient. The patient was provided an opportunity to ask questions and all were answered. The patient agreed with the plan and demonstrated an understanding of the instructions.  A copy of instructions were sent to the patient via MyChart unless otherwise noted below.     The patient was advised to call back or seek an in-person  evaluation if the symptoms worsen or if the condition fails to improve as anticipated.    Lamar Schlossman, PA-C     [1]  Allergies Allergen Reactions   Vicodin [Hydrocodone -Acetaminophen ] Nausea And Vomiting  [2]  Current Outpatient Medications:    albuterol  (PROVENTIL ) (2.5 MG/3ML) 0.083% nebulizer solution, Take 3 mLs (2.5 mg total) by nebulization every 4 (four) hours as needed for wheezing or shortness of breath., Disp: 75 mL, Rfl: 1   albuterol  (VENTOLIN  HFA) 108 (90 Base) MCG/ACT inhaler, INHALE 2 PUFFS INTO THE LUNGS EVERY 4 HOURS AS NEEDED FOR WHEEZING OR SHORTNESS OF BREATH., Disp: 20.1 each, Rfl: 1   atorvastatin  (LIPITOR) 10 MG tablet, Take 1 tablet (10 mg total) by mouth daily., Disp: 90 tablet, Rfl: 3   Blood Glucose Monitoring Suppl (CONTOUR NEXT MONITOR) w/Device KIT, 1 each by Does not apply route 2 (two) times daily. To check blood sugars twice a day., Disp: 1 kit, Rfl: 0   Buprenorphine  15 MCG/HR PTWK, Place 1 patch onto the skin every Monday. Uses 20 mcg at this time, Disp: , Rfl: 0   Cholecalciferol  (VITAMIN D3) 50  MCG (2000 UT) TABS, Take 2,000 Units by mouth every morning., Disp: , Rfl:    Continuous Blood Gluc Receiver (DEXCOM G7 RECEIVER) DEVI, Use to check blood sugar. E11.65, Disp: 1 each, Rfl: 0   Continuous Glucose Sensor (DEXCOM G7 SENSOR) MISC, CHANGE SENSORS EVERY 10 DAYS, Disp: 9 each, Rfl: 3   furosemide  (LASIX ) 40 MG tablet, TAKE 1 TABLET BY MOUTH TWICE A DAY (Patient taking differently: Take 40 mg by mouth See admin instructions. Take one tablet (40 mg) by mouth daily when not going out - approximately 5 times week), Disp: 180 tablet, Rfl: 0   gabapentin  (NEURONTIN ) 100 MG capsule, Take 100 mg by mouth 2 (two) times daily. Take with a 300 mg capsule for a total dose of 400 mg twice daily, Disp: , Rfl:    gabapentin  (NEURONTIN ) 300 MG capsule, Take 300 mg by mouth 2 (two) times daily. Take with a 100 mg capsule for a total dose of 400 mg twice daily, Disp: ,  Rfl:    glucose blood (CONTOUR NEXT TEST) test strip, Use to test blood sugar 2 times daily, Disp: 100 each, Rfl: 12   glucose blood test strip, Use as instructed, Disp: 100 each, Rfl: 0   hydroxychloroquine  (PLAQUENIL ) 200 MG tablet, Take 1 tablet (200 mg total) by mouth daily., Disp: 90 tablet, Rfl: 0   Insulin  Pen Needle 31G X 8 MM MISC, 1 Device by Does not apply route 3 (three) times daily., Disp: 300 each, Rfl: 3   insulin  regular human CONCENTRATED (HUMULIN  R U-500 KWIKPEN) 500 UNIT/ML KwikPen, Inject 50 Units into the skin daily with breakfast AND 36 Units daily before lunch AND 36 Units daily before supper. Max daily 200 units., Disp: 15 mL, Rfl: 6   Lancets (ONETOUCH DELICA PLUS LANCET33G) MISC, Use to check blood sugar one time daily, Disp: 200 each, Rfl: 3   levothyroxine  (SYNTHROID ) 75 MCG tablet, Take 1 tablet (75 mcg total) by mouth daily., Disp: 90 tablet, Rfl: 3   lidocaine  (XYLOCAINE ) 5 % ointment, Apply 1 application topically 2 (two) times daily as needed (pain)., Disp: , Rfl:    metFORMIN  (GLUCOPHAGE -XR) 500 MG 24 hr tablet, Take 2 tablets (1,000 mg total) by mouth daily with breakfast., Disp: 180 tablet, Rfl: 3   methocarbamol  (ROBAXIN ) 750 MG tablet, Take 1 tablet (750 mg total) by mouth 2 (two) times daily. (Patient taking differently: Take 750 mg by mouth every morning.), Disp: 60 tablet, Rfl: 5   Multiple Vitamin (MULTIVITAMIN WITH MINERALS) TABS tablet, Take 1 tablet by mouth daily., Disp: , Rfl:    Multiple Vitamins-Minerals (AIRBORNE GUMMIES) CHEW, Chew 3 tablets by mouth every morning., Disp: , Rfl:    ondansetron  (ZOFRAN ) 4 MG tablet, Take 1 tablet (4 mg total) by mouth as directed. Take one Zofran  pill 30-60 minutes before each colonoscopy prep dose, Disp: 2 tablet, Rfl: 0   pramipexole  (MIRAPEX ) 0.5 MG tablet, TAKE 1 TABLET BY MOUTH EVERYDAY AT BEDTIME, Disp: 90 tablet, Rfl: 0   rivaroxaban  (XARELTO ) 20 MG TABS tablet, Take 1 tablet (20 mg total) by mouth daily., Disp:  90 tablet, Rfl: 3   sertraline  (ZOLOFT ) 100 MG tablet, TAKE 2 TABLETS BY MOUTH AT BEDTIME, Disp: 180 tablet, Rfl: 0   tirzepatide  (MOUNJARO ) 5 MG/0.5ML Pen, Inject 5 mg into the skin once a week., Disp: 6 mL, Rfl: 3  "

## 2024-02-27 NOTE — Telephone Encounter (Signed)
 I sent in a week of Macrobid  with 2 refills

## 2024-02-27 NOTE — Telephone Encounter (Signed)
 No virtual or in person open slots for today/tomorrow,  please advise

## 2024-02-27 NOTE — Addendum Note (Signed)
 Addended by: JOHNNY SENIOR A on: 02/27/2024 01:21 PM   Modules accepted: Orders

## 2024-02-27 NOTE — Telephone Encounter (Signed)
 FYI Only or Action Required?: Action required by provider: update on patient condition and Requesting ABX or work in [employer] Visit with Dr Johnny only for today- doesn't want to wait until tomorrow-- chronic UTIs.  Patient was last seen in primary care on 02/27/2024 by Vicky Charleston, PA-C.  Called Nurse Triage reporting Fever and Urinary Frequency.  Symptoms began yesterday.  Interventions attempted: Rest, hydration, or home remedies.  Symptoms are: gradually worsening.  Triage Disposition: See HCP Within 4 Hours (Or PCP Triage)  Patient/caregiver understands and will follow disposition?: No, wishes to speak with PCP  Message from Ahlexyia S sent at 02/27/2024  8:39 AM EST  Reason for Triage: Pt called in stating that she had a fever of 101.7, pt stated that fever dropped after taking tylenol . Pt is also having vaginal odor, lower back pain, frequent urination and cloudy urine. Denied any other symptoms. Warm transferred to nurse triage.    Reason for Disposition  Fever > 100.4 F (38.0 C)  Answer Assessment - Initial Assessment Questions Tuesday noticed she was developing vaginal odor- sometimes goes away so waited. Weds developed increased urinary frequency and low back pain, Weds night fever 101.7 (tylenol  broke it). Denies current fever, urinating every hour. Denies pain just uncomfortable but she is on a pain patch so doesn't know how much that is obscuring her score.  Denies abdominal pain, CP, SOB, blood in urine.   Tried to do Virtual UC visit but since not PCP and pt with complex chronic UTIs would need to be Video visit with PCP for ABX. Patient unable to make it to the office and wanting to address it today.   Requesting ABX or work in [employer] with Dr Johnny only for today- doesn't want to wait until tomorrow-- chronic UTIs  1. SYMPTOM: What's the main symptom you're concerned about? (e.g., frequency, incontinence)     Increased frequency, odor, cloudy urine  2. ONSET:  When did the  frequency  start?     Tuesday  3. PAIN: Is there any pain? If Yes, ask: How bad is it? (Scale: 1-10; mild, moderate, severe)     On pain patch 24/7- uncomfortable  4. CAUSE: What do you think is causing the symptoms?     UTI  5. OTHER SYMPTOMS: Do you have any other symptoms? (e.g., blood in urine, fever, flank pain, pain with urination)     Low back pain, vaginal odor, cloudy urine  Protocols used: Urinary Symptoms-A-AH

## 2024-02-28 ENCOUNTER — Other Ambulatory Visit: Payer: Self-pay

## 2024-02-28 MED ORDER — NITROFURANTOIN MONOHYD MACRO 100 MG PO CAPS: 100.0000 mg | ORAL_CAPSULE | Freq: Two times a day (BID) | ORAL | 2 refills | Status: AC

## 2024-03-11 ENCOUNTER — Other Ambulatory Visit: Payer: Medicare (Managed Care)

## 2024-05-18 ENCOUNTER — Ambulatory Visit: Payer: Medicare (Managed Care) | Admitting: Internal Medicine
